# Patient Record
Sex: Male | Born: 1960 | Race: White | Hispanic: No | Marital: Married | Smoking: Never smoker
Health system: Southern US, Community
[De-identification: ages and names within clinical notes are randomized; demographics above are authoritative.]

## PROBLEM LIST (undated history)

## (undated) DIAGNOSIS — M5416 Radiculopathy, lumbar region: Secondary | ICD-10-CM

## (undated) DIAGNOSIS — R197 Diarrhea, unspecified: Secondary | ICD-10-CM

## (undated) DIAGNOSIS — Z8507 Personal history of malignant neoplasm of pancreas: Secondary | ICD-10-CM

## (undated) DIAGNOSIS — E785 Hyperlipidemia, unspecified: Secondary | ICD-10-CM

## (undated) DIAGNOSIS — E1149 Type 2 diabetes mellitus with other diabetic neurological complication: Secondary | ICD-10-CM

## (undated) DIAGNOSIS — N186 End stage renal disease: Secondary | ICD-10-CM

## (undated) DIAGNOSIS — Z862 Personal history of diseases of the blood and blood-forming organs and certain disorders involving the immune mechanism: Secondary | ICD-10-CM

## (undated) DIAGNOSIS — I872 Venous insufficiency (chronic) (peripheral): Secondary | ICD-10-CM

## (undated) DIAGNOSIS — Z87442 Personal history of urinary calculi: Secondary | ICD-10-CM

## (undated) DIAGNOSIS — Z992 Dependence on renal dialysis: Secondary | ICD-10-CM

## (undated) DIAGNOSIS — I10 Essential (primary) hypertension: Secondary | ICD-10-CM

## (undated) DIAGNOSIS — G5603 Carpal tunnel syndrome, bilateral upper limbs: Secondary | ICD-10-CM

## (undated) DIAGNOSIS — M5412 Radiculopathy, cervical region: Secondary | ICD-10-CM

## (undated) DIAGNOSIS — K429 Umbilical hernia without obstruction or gangrene: Secondary | ICD-10-CM

## (undated) HISTORY — DX: End stage renal disease: N18.6

## (undated) HISTORY — DX: Venous insufficiency (chronic) (peripheral): I87.2

## (undated) HISTORY — DX: Personal history of diseases of the blood and blood-forming organs and certain disorders involving the immune mechanism: Z86.2

## (undated) HISTORY — PX: TONSILLECTOMY: SUR1361

## (undated) HISTORY — DX: Personal history of urinary calculi: Z87.442

## (undated) HISTORY — PX: FOOT SURGERY: SHX648

## (undated) HISTORY — PX: WISDOM TOOTH EXTRACTION: SHX21

## (undated) HISTORY — PX: EYE SURGERY: SHX253

## (undated) HISTORY — PX: HEMILAMINOTOMY LUMBAR SPINE: SUR654

## (undated) HISTORY — DX: Umbilical hernia without obstruction or gangrene: K42.9

## (undated) HISTORY — DX: Dependence on renal dialysis: Z99.2

## (undated) HISTORY — DX: Diarrhea, unspecified: R19.7

## (undated) HISTORY — PX: TONSILLECTOMY AND ADENOIDECTOMY: SUR1326

---

## 2004-02-19 ENCOUNTER — Ambulatory Visit: Payer: Self-pay | Admitting: Family Medicine

## 2004-02-24 ENCOUNTER — Ambulatory Visit: Payer: Self-pay | Admitting: Family Medicine

## 2004-02-27 ENCOUNTER — Ambulatory Visit: Payer: Self-pay | Admitting: Family Medicine

## 2004-03-06 ENCOUNTER — Ambulatory Visit: Payer: Self-pay | Admitting: Family Medicine

## 2004-03-17 ENCOUNTER — Ambulatory Visit: Payer: Self-pay | Admitting: Family Medicine

## 2004-03-31 ENCOUNTER — Ambulatory Visit: Payer: Self-pay | Admitting: Family Medicine

## 2004-07-10 ENCOUNTER — Ambulatory Visit: Payer: Self-pay | Admitting: Family Medicine

## 2004-07-16 ENCOUNTER — Ambulatory Visit: Payer: Self-pay | Admitting: Family Medicine

## 2004-08-18 ENCOUNTER — Ambulatory Visit: Payer: Self-pay | Admitting: Family Medicine

## 2004-08-27 ENCOUNTER — Ambulatory Visit: Payer: Self-pay | Admitting: Family Medicine

## 2004-09-07 ENCOUNTER — Ambulatory Visit: Payer: Self-pay | Admitting: Family Medicine

## 2004-09-24 ENCOUNTER — Ambulatory Visit: Payer: Self-pay | Admitting: Family Medicine

## 2004-10-23 ENCOUNTER — Ambulatory Visit: Payer: Self-pay | Admitting: Family Medicine

## 2012-12-08 ENCOUNTER — Other Ambulatory Visit (HOSPITAL_COMMUNITY)
Admission: RE | Admit: 2012-12-08 | Discharge: 2012-12-08 | Disposition: A | Discharge status: Home / Self Care | Payer: BC Managed Care – PPO | Source: Ambulatory Visit | Attending: Pathology | Admitting: Pathology

## 2013-01-03 ENCOUNTER — Other Ambulatory Visit (HOSPITAL_COMMUNITY)
Admission: RE | Admit: 2013-01-03 | Discharge: 2013-01-03 | Disposition: A | Discharge status: Home / Self Care | Payer: BC Managed Care – PPO | Source: Ambulatory Visit | Attending: Pathology | Admitting: Pathology

## 2013-01-03 DIAGNOSIS — C8589 Other specified types of non-Hodgkin lymphoma, extranodal and solid organ sites: Secondary | ICD-10-CM | POA: Insufficient documentation

## 2013-04-19 HISTORY — PX: CATARACT EXTRACTION W/ INTRAOCULAR LENS IMPLANT: SHX1309

## 2015-12-04 ENCOUNTER — Other Ambulatory Visit: Payer: Self-pay | Admitting: Vascular Surgery

## 2015-12-04 DIAGNOSIS — I872 Venous insufficiency (chronic) (peripheral): Secondary | ICD-10-CM

## 2015-12-16 ENCOUNTER — Encounter: Payer: Self-pay | Admitting: Vascular Surgery

## 2015-12-24 ENCOUNTER — Encounter (HOSPITAL_COMMUNITY): Payer: Self-pay

## 2015-12-24 ENCOUNTER — Encounter: Payer: Self-pay | Admitting: Vascular Surgery

## 2016-02-12 ENCOUNTER — Encounter: Payer: Self-pay | Admitting: Vascular Surgery

## 2016-02-17 ENCOUNTER — Encounter (INDEPENDENT_AMBULATORY_CARE_PROVIDER_SITE_OTHER): Payer: Self-pay

## 2016-02-17 ENCOUNTER — Ambulatory Visit (INDEPENDENT_AMBULATORY_CARE_PROVIDER_SITE_OTHER): Payer: BLUE CROSS/BLUE SHIELD | Admitting: Vascular Surgery

## 2016-02-17 ENCOUNTER — Encounter: Payer: Self-pay | Admitting: Vascular Surgery

## 2016-02-17 ENCOUNTER — Ambulatory Visit (HOSPITAL_COMMUNITY)
Admission: RE | Admit: 2016-02-17 | Discharge: 2016-02-17 | Disposition: A | Discharge status: Home / Self Care | Payer: BLUE CROSS/BLUE SHIELD | Source: Ambulatory Visit | Attending: Vascular Surgery | Admitting: Vascular Surgery

## 2016-02-17 VITALS — BP 168/80 | HR 58 | Temp 97.3°F | Resp 18 | Ht 72.0 in | Wt 272.0 lb

## 2016-02-17 DIAGNOSIS — R609 Edema, unspecified: Secondary | ICD-10-CM | POA: Diagnosis present

## 2016-02-17 DIAGNOSIS — I872 Venous insufficiency (chronic) (peripheral): Secondary | ICD-10-CM | POA: Insufficient documentation

## 2016-02-17 DIAGNOSIS — I83893 Varicose veins of bilateral lower extremities with other complications: Secondary | ICD-10-CM | POA: Diagnosis not present

## 2016-02-17 NOTE — Progress Notes (Signed)
Vascular and Vein Specialist of [facility]  Patient name: Scott Castillo MRN: [id number] DOB: January 16, 1961 Sex: male  REASON FOR CONSULT: Severe bilateral venous stasis disease  HPI: Scott Castillo is a 55 y.o. male, who is day for evaluation of his severe bilateral venous stasis disease. He is very pleasant gentleman with progressive changes of hemosiderin deposits and thickening in both lower extremities. He has a long-standing history of diabetes and also has bilateral Charcot foot. He has had no difficulty with the arterial insufficiency. He has had progressive changes of severe venous stasis disease from his knees down to his ankle bilaterally with hemosiderin deposits circumferentially. He has had some superficial excoriations but no true venous ulcers. He stands for long shifts as his work at [employer] home [employer]. He does wear compression garments and has so for years. Despite this he has had continued progression of the severe swelling and achy sensation. He does have pain with prolonged standing extending into his legs below his knees bilaterally.  Past Medical History:  Diagnosis Date  . Venous stasis dermatitis of both lower extremities     No family history on file.  SOCIAL HISTORY: Social History   Social History  . Marital status: Married    Spouse name: N/A  . Number of children: N/A  . Years of education: N/A   Occupational History  . Not on file.   Social History Main Topics  . Smoking status: Never Smoker  . Smokeless tobacco: Not on file  . Alcohol use No  . Drug use: No  . Sexual activity: Not on file   Other Topics Concern  . Not on file   Social History Narrative  . No narrative on file    Allergies  Allergen Reactions  . Pioglitazone Swelling    Current Outpatient Prescriptions  Medication Sig Dispense Refill  . carvedilol (COREG) 25 MG tablet Take 25 mg by mouth.    . Cholecalciferol (VITAMIN D3) 1000  units CAPS Take by mouth.    Marland Kitchen CINNAMON PO Take by mouth.    . co-enzyme Q-10 50 MG capsule Take 100 mg by mouth daily.    Marland Kitchen DANDELION PO Take by mouth.    . Horse Chestnut 300 MG CAPS Take by mouth.    . Insulin Degludec 200 UNIT/ML SOPN Inject into the skin. TRESIBA 46 Units    . Insulin Pen Needle (BD ULTRA-FINE PEN NEEDLES) 29G X 12.7MM MISC USE AS DIRECTED EVERY DAY    . Misc Natural Products (ECHINACEA COMPOUND PO) Take by mouth.    . Misc Natural Products (OSTEO BI-FLEX TRIPLE STRENGTH) TABS Take by mouth.    . Multiple Vitamin (MULTIVITAMIN) tablet Take 1 tablet by mouth daily.    . pravastatin (PRAVACHOL) 80 MG tablet Take 80 mg by mouth.    . Probiotic Product ([employer] ADV PROBIOTIC GUMMIES PO) Take by mouth.    . sitaGLIPtin (JANUVIA) 50 MG tablet Take 50 mg by mouth.    . TURMERIC PO Take 720 mg by mouth 2 (two) times daily.    . valsartan (DIOVAN) 160 MG tablet TAKE 1 TABLET (160 MG TOTAL) BY MOUTH EVERY MORNING. HIGH BLOOD PRESSURE    . vitamin B-12 (CYANOCOBALAMIN) 500 MCG tablet Take 500 mcg by mouth daily.    . vitamin C (ASCORBIC ACID) 500 MG tablet Take 500 mg by mouth daily.    . vitamin E 1000 UNIT capsule Take 1,000 Units by mouth daily.     No  current facility-administered medications for this visit.     REVIEW OF SYSTEMS:  [X]  denotes positive finding, [ ]  denotes negative finding Cardiac  Comments:  Chest pain or chest pressure:    Shortness of breath upon exertion:    Short of breath when lying flat:    Irregular heart rhythm:        Vascular    Pain in calf, thigh, or hip brought on by ambulation:    Pain in feet at night that wakes you up from your sleep:     Blood clot in your veins:    Leg swelling:  x       Pulmonary    Oxygen at home:    Productive cough:     Wheezing:         Neurologic    Sudden weakness in arms or legs:     Sudden numbness in arms or legs:     Sudden onset of difficulty speaking or slurred speech:    Temporary loss of vision  in one eye:     Problems with dizziness:         Gastrointestinal    Blood in stool:     Vomited blood:         Genitourinary    Burning when urinating:     Blood in urine:        Psychiatric    G[place]ka depression:         Hematologic    Bleeding problems:    Problems with blood clotting too easily:        Skin    Rashes or ulcers:        Constitutional    Fever or chills:      PHYSICAL EXAM: Vitals:   02/17/16 1323  BP: (!) 168/80  Pulse: (!) 58  Resp: 18  Temp: 97.3 F (36.3 C)  TempSrc: Oral  SpO2: 98%  Weight: 272 lb (123.4 kg)  Height: 6' (1.829 m)    GENERAL: The patient is a well-nourished male, in no acute distress. The vital signs are documented above. CARDIOVASCULAR: He does have severe swelling in both lower extremities. He does have palpable dorsalis pedis pulses bilaterally. Carotid arteries are without bruits bilaterally. Heart is regular rate and rhythm PULMONARY: There is good air exchange  ABDOMEN: Soft and non-tender  MUSCULOSKELETAL: There are no Benoist deformities or cyanosis. NEUROLOGIC: No focal weakness or paresthesias are detected. SKIN: Severe changes of chronic venous stasis disease with extreme thickening from just below his knees to his ankle and circumferential hemosiderin deposits bilaterally PSYCHIATRIC: The patient has a normal affect.  DATA:  Tributary venous reflux study shows no evidence of DVT or obstruction. Does have reflux in his great saphenous vein bilaterally with significant dilatation and his saphenous veins bilaterally as well. He does have some deep venous reflux limited to his common femoral only in the left leg and in his common femoral and femoral vein and the right leg.  MEDICAL ISSUES: Severe lower extremity venous hypertension. Marked swelling and progressive changes regarding his hemosiderin deposit. Have recommended bilateral stage laser ablation of his great saphenous vein for improvement in his venous  hypertension. Described the procedures an outpatient procedure under local anesthesia in our office. He wishes to proceed as soon as possible related to his work schedule. He reports the left leg is somewhat worse in the right and therefore we will treat this leg first and then a planned right leg treatment several weeks  later   [name], MD [facility] Vascular and Vein Specialists of [facility] [phone] Pager [phone]

## 2016-03-17 ENCOUNTER — Other Ambulatory Visit: Payer: Self-pay | Admitting: *Deleted

## 2016-03-17 DIAGNOSIS — I83893 Varicose veins of bilateral lower extremities with other complications: Secondary | ICD-10-CM

## 2016-04-01 ENCOUNTER — Encounter: Payer: Self-pay | Admitting: Vascular Surgery

## 2016-04-08 ENCOUNTER — Ambulatory Visit (INDEPENDENT_AMBULATORY_CARE_PROVIDER_SITE_OTHER): Payer: BLUE CROSS/BLUE SHIELD | Admitting: Vascular Surgery

## 2016-04-08 ENCOUNTER — Encounter: Payer: Self-pay | Admitting: Vascular Surgery

## 2016-04-08 VITALS — BP 161/82 | HR 52 | Temp 96.9°F | Resp 18 | Ht 73.0 in | Wt 282.0 lb

## 2016-04-08 DIAGNOSIS — I83893 Varicose veins of bilateral lower extremities with other complications: Secondary | ICD-10-CM

## 2016-04-08 HISTORY — PX: ENDOVENOUS ABLATION SAPHENOUS VEIN W/ LASER: SUR449

## 2016-04-08 NOTE — Progress Notes (Signed)
     Laser Ablation Procedure    Date: 04/08/2016   Scott Castillo DOB:1961-03-06  Consent signed: Yes    Surgeon:  Dr. Sherren Mocha Tammela Bales  Procedure: Laser Ablation: right Greater Saphenous Vein  BP (!) 161/82 (BP Location: Left Arm, Patient Position: Sitting, Cuff Size: Large)   Pulse (!) 52   Temp (!) 96.9 F (36.1 C) (Oral)   Resp 18   Ht 6\' 1"  (1.854 m)   Wt 282 lb (127.9 kg)   SpO2 100%   BMI 37.21 kg/m   Tumescent Anesthesia: 500 cc 0.9% NaCl with 50 cc Lidocaine HCL with 1% Epi and 15 cc 8.4% NaHCO3  Local Anesthesia: 6 cc Lidocaine HCL and NaHCO3 (ratio 2:1)  15 watts continuous mode        Total energy: 3198   Total time: 3:33       Patient tolerated procedure well   Description of Procedure:  After marking the course of the secondary varicosities, the patient was placed on the operating table in the supine position, and the right leg was prepped and draped in sterile fashion.   Local anesthetic was administered and under ultrasound guidance the saphenous vein was accessed with a micro needle and guide wire; then the mirco puncture sheath was placed.  A guide wire was inserted saphenofemoral junction , followed by a 5 french sheath.  The position of the sheath and then the laser fiber below the junction was confirmed using the ultrasound.  Tumescent anesthesia was administered along the course of the saphenous vein using ultrasound guidance. The patient was placed in Trendelenburg position and protective laser glasses were placed on patient and staff, and the laser was fired at 15 watts continuous mode advancing 1-35mm/second for a total of 3198  joules.   Steri strip was applied and ABD pads and thigh high compression stockings were applied.  Ace wrap bandages were applied  at the top of the saphenofemoral junction. Blood loss was less than 15 cc.  The patient ambulated out of the operating room having tolerated the procedure well.  Uneventful ablation from mid calf to  below saphenofemoral junction. Follow-up in one week with ultrasound

## 2016-04-09 ENCOUNTER — Encounter: Payer: Self-pay | Admitting: Vascular Surgery

## 2016-04-16 ENCOUNTER — Ambulatory Visit (HOSPITAL_COMMUNITY)
Admission: RE | Admit: 2016-04-16 | Discharge: 2016-04-16 | Disposition: A | Discharge status: Home / Self Care | Payer: BLUE CROSS/BLUE SHIELD | Source: Ambulatory Visit | Attending: Vascular Surgery | Admitting: Vascular Surgery

## 2016-04-16 DIAGNOSIS — Z9889 Other specified postprocedural states: Secondary | ICD-10-CM | POA: Diagnosis not present

## 2016-04-16 DIAGNOSIS — I83893 Varicose veins of bilateral lower extremities with other complications: Secondary | ICD-10-CM | POA: Insufficient documentation

## 2016-04-20 ENCOUNTER — Telehealth: Payer: Self-pay | Admitting: *Deleted

## 2016-04-20 ENCOUNTER — Other Ambulatory Visit: Payer: Self-pay | Admitting: *Deleted

## 2016-04-20 DIAGNOSIS — I83893 Varicose veins of bilateral lower extremities with other complications: Secondary | ICD-10-CM

## 2016-04-20 NOTE — Telephone Encounter (Signed)
Per Dr. Luther Parody instructions, called Scott Castillo and notified him that post laser ablation duplex of right leg (04-16-2016) showed that right greater saphenous vein was closed/successfully ablated and that no DVT was noted.  Per Dr. Donnetta Hutching, VV FU on 04-22-2016 was cancelled and Scott Castillo aware of appointment cancellation. Scott Castillo verbalized understanding.

## 2016-04-22 ENCOUNTER — Ambulatory Visit: Payer: BLUE CROSS/BLUE SHIELD | Admitting: Vascular Surgery

## 2016-05-06 ENCOUNTER — Encounter: Payer: Self-pay | Admitting: Vascular Surgery

## 2016-05-13 ENCOUNTER — Ambulatory Visit (INDEPENDENT_AMBULATORY_CARE_PROVIDER_SITE_OTHER): Payer: BLUE CROSS/BLUE SHIELD | Admitting: Vascular Surgery

## 2016-05-13 ENCOUNTER — Encounter: Payer: Self-pay | Admitting: Vascular Surgery

## 2016-05-13 VITALS — BP 149/75 | HR 75 | Temp 97.2°F | Resp 18 | Ht 73.0 in | Wt 282.0 lb

## 2016-05-13 DIAGNOSIS — I83893 Varicose veins of bilateral lower extremities with other complications: Secondary | ICD-10-CM

## 2016-05-13 HISTORY — PX: ENDOVENOUS ABLATION SAPHENOUS VEIN W/ LASER: SUR449

## 2016-05-13 NOTE — Progress Notes (Signed)
     Laser Ablation Procedure    Date: 05/13/2016   Scott Castillo DOB:1961-03-03  Consent signed: Yes    Surgeon:  Dr. Sherren Mocha Jaisen Wiltrout  Procedure: Laser Ablation: left Greater Saphenous Vein  BP (!) 149/75 (BP Location: Right Arm, Patient Position: Sitting, Cuff Size: Large)   Pulse 75   Temp 97.2 F (36.2 C) (Oral)   Resp 18   Ht 6\' 1"  (1.854 m)   Wt 282 lb (127.9 kg)   SpO2 97%   BMI 37.21 kg/m   Tumescent Anesthesia: 425 cc 0.9% NaCl with 50 cc Lidocaine HCL with 1% Epi and 15 cc 8.4% NaHCO3  Local Anesthesia: 4 cc Lidocaine HCL and NaHCO3 (ratio 2:1)  15 watts continuous mode        Total energy: 3294 Joules   Total time: 3:39     Patient tolerated procedure well    Description of Procedure:  After marking the course of the secondary varicosities, the patient was placed on the operating table in the supine position, and the left leg was prepped and draped in sterile fashion.   Local anesthetic was administered and under ultrasound guidance the saphenous vein was accessed with a micro needle and guide wire; then the mirco puncture sheath was placed.  A guide wire was inserted saphenofemoral junction , followed by a 5 french sheath.  The position of the sheath and then the laser fiber below the junction was confirmed using the ultrasound.  Tumescent anesthesia was administered along the course of the saphenous vein using ultrasound guidance. The patient was placed in Trendelenburg position and protective laser glasses were placed on patient and staff, and the laser was fired at 15 watts continuous mode advancing 1-55mm/second for a total of 3294 joules.     Steri strip was applied to the IV insertion site and ABD pads and thigh high compression stockings were applied.  Ace wrap bandages were applied over the calf and thigh. Blood loss was less than 15 cc.  The patient ambulated out of the operating room having tolerated the procedure well.  Uneventful ablation from mid calf  to just below saphenofemoral junction. Follow-up in one week

## 2016-05-14 ENCOUNTER — Telehealth: Payer: Self-pay | Admitting: *Deleted

## 2016-05-14 NOTE — Telephone Encounter (Signed)
    05/14/2016  Time: 8:50 AM   Patient Name: Scott Castillo  Patient of: T.F. Early  Procedure:Laser Ablation left greater saphenous vein 05-13-2016  Reached patient at home and checked  His status  Yes    Comments/Actions Taken: Mr. Meggison states he is "sore in my left thigh--over the area that Dr. Donnetta Hutching treated."  Mr. Allers states he is taking Ibuprofen as instructed (Ibuprofen 600 mg three times daily with meals), elevating his left leg, and wearing his compression dressing.  Suggested that he use ice compress over left thigh as needed for discomfort.  Reviewed all post procedural instructions with him and reminded him of post LA duplex and VV follow up with Dr. Donnetta Hutching on 05-20-2016.        @SIGNATURE @

## 2016-05-17 ENCOUNTER — Encounter: Payer: Self-pay | Admitting: Vascular Surgery

## 2016-05-20 ENCOUNTER — Ambulatory Visit (INDEPENDENT_AMBULATORY_CARE_PROVIDER_SITE_OTHER): Payer: BLUE CROSS/BLUE SHIELD | Admitting: Vascular Surgery

## 2016-05-20 ENCOUNTER — Encounter: Payer: Self-pay | Admitting: Vascular Surgery

## 2016-05-20 ENCOUNTER — Ambulatory Visit (HOSPITAL_COMMUNITY)
Admission: RE | Admit: 2016-05-20 | Discharge: 2016-05-20 | Disposition: A | Discharge status: Home / Self Care | Payer: BLUE CROSS/BLUE SHIELD | Source: Ambulatory Visit | Attending: Vascular Surgery | Admitting: Vascular Surgery

## 2016-05-20 VITALS — BP 177/85 | HR 73 | Temp 97.3°F | Resp 22 | Ht 73.0 in | Wt 301.0 lb

## 2016-05-20 DIAGNOSIS — I83893 Varicose veins of bilateral lower extremities with other complications: Secondary | ICD-10-CM | POA: Diagnosis not present

## 2016-05-20 NOTE — Progress Notes (Signed)
Vascular and Vein Specialist of [facility]  Patient name: Scott Castillo MRN: [id number] DOB: 05-19-1960 Sex: male  REASON FOR VISIT: One-week follow-up after left leg great saphenous vein ablation  HPI: Scott Castillo is a 56 y.o. male here today for follow-up. Has had staged bilateral great saphenous vein ablation. Has severe swelling in both lower from these with the bilateral chronic ulcerations. He reports mild discomfort following his procedure. Has been compliant with his compression  Past Medical History:  Diagnosis Date  . Venous stasis dermatitis of both lower extremities     No family history on file.  SOCIAL HISTORY: Social History  Substance Use Topics  . Smoking status: Never Smoker  . Smokeless tobacco: Never Used  . Alcohol use No    Allergies  Allergen Reactions  . Pioglitazone Swelling    Current Outpatient Prescriptions  Medication Sig Dispense Refill  . carvedilol (COREG) 25 MG tablet Take 25 mg by mouth.    . Cholecalciferol (VITAMIN D3) 1000 units CAPS Take by mouth.    Marland Kitchen CINNAMON PO Take by mouth.    . co-enzyme Q-10 50 MG capsule Take 100 mg by mouth daily.    Marland Kitchen DANDELION PO Take by mouth.    . Horse Chestnut 300 MG CAPS Take by mouth.    . Insulin Degludec 200 UNIT/ML SOPN Inject into the skin. TRESIBA 46 Units    . Insulin Pen Needle (BD ULTRA-FINE PEN NEEDLES) 29G X 12.7MM MISC USE AS DIRECTED EVERY DAY    . Misc Natural Products (ECHINACEA COMPOUND PO) Take by mouth.    . Misc Natural Products (OSTEO BI-FLEX TRIPLE STRENGTH) TABS Take by mouth.    . Multiple Vitamin (MULTIVITAMIN) tablet Take 1 tablet by mouth daily.    . pravastatin (PRAVACHOL) 80 MG tablet Take 80 mg by mouth.    . Probiotic Product (CVS ADV PROBIOTIC GUMMIES PO) Take by mouth.    . sitaGLIPtin (JANUVIA) 50 MG tablet Take 50 mg by mouth.    . TURMERIC PO Take 720 mg by mouth 2 (two) times daily.    . valsartan (DIOVAN) 160 MG tablet  TAKE 1 TABLET (160 MG TOTAL) BY MOUTH EVERY MORNING. HIGH BLOOD PRESSURE    . vitamin B-12 (CYANOCOBALAMIN) 500 MCG tablet Take 500 mcg by mouth daily.    . vitamin C (ASCORBIC ACID) 500 MG tablet Take 500 mg by mouth daily.    . vitamin E 1000 UNIT capsule Take 1,000 Units by mouth daily.     No current facility-administered medications for this visit.     REVIEW OF SYSTEMS:  [X]  denotes positive finding, [ ]  denotes negative finding Cardiac  Comments:  Chest pain or chest pressure:    Shortness of breath upon exertion:    Short of breath when lying flat:    Irregular heart rhythm:        Vascular    Pain in calf, thigh, or hip brought on by ambulation:    Pain in feet at night that wakes you up from your sleep:     Blood clot in your veins:    Leg swelling:  x         PHYSICAL EXAM: Vitals:   05/20/16 0902  BP: (!) 177/85  Pulse: 73  Resp: (!) 22  Temp: 97.3 F (36.3 C)  TempSrc: Oral  SpO2: 97%  Weight: (!) 301 lb (136.5 kg)  Height: 6\' 1"  (1.854 m)    GENERAL: The patient is  a well-nourished male, in no acute distress. The vital signs are documented above. CARDIOVASCULAR: No evidence of varicosities PULMONARY: There is good air exchange  MUSCULOSKELETAL: There are no Werden deformities or cyanosis. NEUROLOGIC: No focal weakness or paresthesias are detected. SKIN: Superficial ulceration over his right pretibial and the blister over his left pretibial area PSYCHIATRIC: The patient has a normal affect.  DATA:  Duplex was reviewed with patient. This shows closure of his great saphenous vein from the distal insertion site to approximately 2 cm below the saphenofemoral junction  MEDICAL ISSUES: Stable status post staged bilateral great saphenous vein ablation. Patient understands critical importance of lifelong elevation and compression. Will see [place] again on as-needed basis    [name], MD [facility] Vascular and Vein Specialists of [facility] Tel [phone] Pager [phone]

## 2016-05-27 ENCOUNTER — Telehealth: Payer: Self-pay | Admitting: *Deleted

## 2016-05-27 NOTE — Telephone Encounter (Signed)
Returning Mr. Dirocco voice message.  Unable to speak with Mr. Nagele directly so left a detailed telephone voice message.  Mr. Mier is s/p endovenous laser ablation left greater saphenous vein on 05-13-2016 by Curt Jews MD.  Mr. Saldierna stated in his voice message that he had developed "water blisters on his left leg" and he had "an infection in his left leg."   Mr. Mcnealy stated in his voice message that he had been seen by a physician and was taking oral antibiotics and "an ointment."  Mr. Thatch asked if he needed to wear compression hose and if he could wear lower strength of compression hose.  Reviewed Mr. Wafer symptoms and questions to Dr. Donnetta Hutching.  Dr. Donnetta Hutching advised that Mr. Failla did need to continue wearing knee high compression hose 20-30 mm HG after the blisters resolved/healed.  Relayed Dr. Luther Parody recommendation to Mr. Mendolia via telephone voice message and asked that he call [facility] if he had further questions.

## 2016-07-02 ENCOUNTER — Inpatient Hospital Stay (HOSPITAL_COMMUNITY)
Admission: EM | Admit: 2016-07-02 | Discharge: 2016-07-06 | DRG: 682 | Disposition: A | Discharge status: Home / Self Care | Payer: BLUE CROSS/BLUE SHIELD | Attending: Internal Medicine | Admitting: Internal Medicine

## 2016-07-02 ENCOUNTER — Encounter (HOSPITAL_COMMUNITY): Payer: Self-pay

## 2016-07-02 DIAGNOSIS — E785 Hyperlipidemia, unspecified: Secondary | ICD-10-CM | POA: Diagnosis present

## 2016-07-02 DIAGNOSIS — Z6839 Body mass index (BMI) 39.0-39.9, adult: Secondary | ICD-10-CM

## 2016-07-02 DIAGNOSIS — I872 Venous insufficiency (chronic) (peripheral): Secondary | ICD-10-CM | POA: Diagnosis present

## 2016-07-02 DIAGNOSIS — D638 Anemia in other chronic diseases classified elsewhere: Secondary | ICD-10-CM | POA: Diagnosis present

## 2016-07-02 DIAGNOSIS — Z79899 Other long term (current) drug therapy: Secondary | ICD-10-CM

## 2016-07-02 DIAGNOSIS — I132 Hypertensive heart and chronic kidney disease with heart failure and with stage 5 chronic kidney disease, or end stage renal disease: Secondary | ICD-10-CM | POA: Diagnosis present

## 2016-07-02 DIAGNOSIS — M7989 Other specified soft tissue disorders: Secondary | ICD-10-CM | POA: Diagnosis present

## 2016-07-02 DIAGNOSIS — E669 Obesity, unspecified: Secondary | ICD-10-CM | POA: Diagnosis present

## 2016-07-02 DIAGNOSIS — E1122 Type 2 diabetes mellitus with diabetic chronic kidney disease: Secondary | ICD-10-CM | POA: Diagnosis present

## 2016-07-02 DIAGNOSIS — Z8507 Personal history of malignant neoplasm of pancreas: Secondary | ICD-10-CM

## 2016-07-02 DIAGNOSIS — I2510 Atherosclerotic heart disease of native coronary artery without angina pectoris: Secondary | ICD-10-CM | POA: Diagnosis present

## 2016-07-02 DIAGNOSIS — N189 Chronic kidney disease, unspecified: Secondary | ICD-10-CM

## 2016-07-02 DIAGNOSIS — I5033 Acute on chronic diastolic (congestive) heart failure: Secondary | ICD-10-CM | POA: Diagnosis present

## 2016-07-02 DIAGNOSIS — N179 Acute kidney failure, unspecified: Secondary | ICD-10-CM | POA: Diagnosis not present

## 2016-07-02 DIAGNOSIS — N185 Chronic kidney disease, stage 5: Secondary | ICD-10-CM | POA: Diagnosis present

## 2016-07-02 DIAGNOSIS — E1169 Type 2 diabetes mellitus with other specified complication: Secondary | ICD-10-CM | POA: Diagnosis present

## 2016-07-02 DIAGNOSIS — R600 Localized edema: Secondary | ICD-10-CM | POA: Diagnosis present

## 2016-07-02 DIAGNOSIS — E875 Hyperkalemia: Secondary | ICD-10-CM | POA: Diagnosis present

## 2016-07-02 DIAGNOSIS — Z794 Long term (current) use of insulin: Secondary | ICD-10-CM

## 2016-07-02 DIAGNOSIS — N186 End stage renal disease: Secondary | ICD-10-CM | POA: Diagnosis present

## 2016-07-02 DIAGNOSIS — Z888 Allergy status to other drugs, medicaments and biological substances status: Secondary | ICD-10-CM

## 2016-07-02 DIAGNOSIS — I160 Hypertensive urgency: Secondary | ICD-10-CM | POA: Diagnosis present

## 2016-07-02 LAB — COMPREHENSIVE METABOLIC PANEL
ALBUMIN: 2.9 g/dL — AB (ref 3.5–5.0)
ALT: 14 U/L — ABNORMAL LOW (ref 17–63)
ANION GAP: 9 (ref 5–15)
AST: 15 U/L (ref 15–41)
Alkaline Phosphatase: 63 U/L (ref 38–126)
BUN: 62 mg/dL — ABNORMAL HIGH (ref 6–20)
CHLORIDE: 110 mmol/L (ref 101–111)
CO2: 20 mmol/L — AB (ref 22–32)
Calcium: 8.5 mg/dL — ABNORMAL LOW (ref 8.9–10.3)
Creatinine, Ser: 4.92 mg/dL — ABNORMAL HIGH (ref 0.61–1.24)
GFR calc Af Amer: 14 mL/min — ABNORMAL LOW (ref >60)
GFR calc non Af Amer: 12 mL/min — ABNORMAL LOW (ref >60)
GLUCOSE: 84 mg/dL (ref 65–99)
POTASSIUM: 5.6 mmol/L — AB (ref 3.5–5.1)
SODIUM: 139 mmol/L (ref 135–145)
Total Bilirubin: 0.4 mg/dL (ref 0.3–1.2)
Total Protein: 6.3 g/dL — ABNORMAL LOW (ref 6.5–8.1)

## 2016-07-02 LAB — CBC WITH DIFFERENTIAL/PLATELET
Basophils Absolute: 0 10*3/uL (ref 0.0–0.1)
Basophils Relative: 1 %
Eosinophils Absolute: 0.6 10*3/uL (ref 0.0–0.7)
Eosinophils Relative: 9 %
HEMATOCRIT: 27.1 % — AB (ref 39.0–52.0)
Hemoglobin: 8.6 g/dL — ABNORMAL LOW (ref 13.0–17.0)
LYMPHS ABS: 0.9 10*3/uL (ref 0.7–4.0)
LYMPHS PCT: 13 %
MCH: 28.4 pg (ref 26.0–34.0)
MCHC: 31.7 g/dL (ref 30.0–36.0)
MCV: 89.4 fL (ref 78.0–100.0)
MONO ABS: 0.6 10*3/uL (ref 0.1–1.0)
Monocytes Relative: 9 %
NEUTROS ABS: 5 10*3/uL (ref 1.7–7.7)
Neutrophils Relative %: 68 %
Platelets: 216 10*3/uL (ref 150–400)
RBC: 3.03 MIL/uL — AB (ref 4.22–5.81)
RDW: 14.1 % (ref 11.5–15.5)
WBC: 7.2 10*3/uL (ref 4.0–10.5)

## 2016-07-02 LAB — URINALYSIS, ROUTINE W REFLEX MICROSCOPIC
BILIRUBIN URINE: NEGATIVE
Glucose, UA: 50 mg/dL — AB
KETONES UR: NEGATIVE mg/dL
LEUKOCYTES UA: NEGATIVE
Nitrite: NEGATIVE
Protein, ur: 100 mg/dL — AB
SQUAMOUS EPITHELIAL / LPF: NONE SEEN
Specific Gravity, Urine: 1.011 (ref 1.005–1.030)
pH: 5 (ref 5.0–8.0)

## 2016-07-02 MED ORDER — CARVEDILOL 12.5 MG PO TABS: 25.0000 mg | ORAL_TABLET | Freq: Two times a day (BID) | ORAL | Status: DC

## 2016-07-02 MED ORDER — IRBESARTAN 150 MG PO TABS: 150.0000 mg | ORAL_TABLET | Freq: Every day | ORAL | Status: DC

## 2016-07-02 MED ORDER — HYDRALAZINE HCL 25 MG PO TABS
50.0000 mg | ORAL_TABLET | Freq: Once | ORAL | Status: AC
  Administered 2016-07-03: 50 mg via ORAL
  Filled 2016-07-02: qty 2

## 2016-07-02 NOTE — ED Provider Notes (Signed)
[facility] DEPT Provider Note   CSN: [id number] Arrival date & time: 07/02/16  1802     History   Chief Complaint Chief Complaint  Patient presents with  . Abnormal Lab  . Leg Swelling    HPI Luverne Zerkle Knoche is a 56 y.o. male.  HPI   Patient is a 56 year old male with history of hypertension, diabetes, CAD and venous stasis who presents to the ED with complaint of dyspnea on exertion. Patient reports over the past month after having ablations performed for varicose veins to BLE he began to have gradually worsening SOB and worsening swelling to BLE. Endorses having intermittent nonproductive cough over the past month. Patient reports his symptoms significantly worsened this week with associated dyspnea on exertion. Patient states he was initially seen by his PCP and had labs performed. Pt reports he was notified to come to the ED today by his PCP due to worsening kidney function and DOE. Denies fever, headache, dizziness, wheezing, CP, palpitations, abdominal pain, N/V/D, urinary sxs, flank pain, numbness, weakness.  Past Medical History:  Diagnosis Date  . Venous stasis dermatitis of both lower extremities     There are no active problems to display for this patient.   Past Surgical History:  Procedure Laterality Date  . ENDOVENOUS ABLATION SAPHENOUS VEIN W/ LASER Right 04/08/2016   EVLA R greater saphenous vein by Curt Jews MD  . ENDOVENOUS ABLATION SAPHENOUS VEIN W/ LASER Left 05/13/2016   endovenous laser ablation (left greater saphenous vein) by Curt Jews MD        Home Medications    Prior to Admission medications   Medication Sig Start Date End Date Taking? Authorizing Provider  carvedilol (COREG) 25 MG tablet Take 25 mg by mouth. 07/28/15   Historical Provider, MD  Cholecalciferol (VITAMIN D3) 1000 units CAPS Take by mouth.    Historical Provider, MD  CINNAMON PO Take by mouth.    Historical Provider, MD  co-enzyme Q-10 50 MG capsule Take 100 mg by mouth  daily.    Historical Provider, MD  DANDELION PO Take by mouth.    Historical Provider, MD  Horse Chestnut 300 MG CAPS Take by mouth.    Historical Provider, MD  Insulin Degludec 200 UNIT/ML SOPN Inject into the skin. TRESIBA 46 Units 11/11/15   Historical Provider, MD  Insulin Pen Needle (BD ULTRA-FINE PEN NEEDLES) 29G X 12.7MM MISC USE AS DIRECTED EVERY DAY 01/15/16   Historical Provider, MD  Misc Natural Products (ECHINACEA COMPOUND PO) Take by mouth.    Historical Provider, MD  Misc Natural Products (OSTEO BI-FLEX TRIPLE STRENGTH) TABS Take by mouth.    Historical Provider, MD  Multiple Vitamin (MULTIVITAMIN) tablet Take 1 tablet by mouth daily.    Historical Provider, MD  pravastatin (PRAVACHOL) 80 MG tablet Take 80 mg by mouth.    Historical Provider, MD  Probiotic Product (CVS ADV PROBIOTIC GUMMIES PO) Take by mouth.    Historical Provider, MD  sitaGLIPtin (JANUVIA) 50 MG tablet Take 50 mg by mouth.    Historical Provider, MD  TURMERIC PO Take 720 mg by mouth 2 (two) times daily.    Historical Provider, MD  valsartan (DIOVAN) 160 MG tablet TAKE 1 TABLET (160 MG TOTAL) BY MOUTH EVERY MORNING. HIGH BLOOD PRESSURE 01/19/16   Historical Provider, MD  vitamin B-12 (CYANOCOBALAMIN) 500 MCG tablet Take 500 mcg by mouth daily.    Historical Provider, MD  vitamin C (ASCORBIC ACID) 500 MG tablet Take 500 mg by mouth daily.  Historical Provider, MD  vitamin E 1000 UNIT capsule Take 1,000 Units by mouth daily.    Historical Provider, MD    Family History History reviewed. No pertinent family history.  Social History Social History  Substance Use Topics  . Smoking status: Never Smoker  . Smokeless tobacco: Never Used  . Alcohol use No     Allergies   Pioglitazone   Review of Systems Review of Systems  Respiratory: Positive for cough and shortness of breath (DOE).   Cardiovascular: Positive for leg swelling.  All other systems reviewed and are negative.    Physical Exam Updated  Vital Signs BP (!) 191/83 (BP Location: Right Arm)   Pulse 61   Temp 98.3 F (36.8 C) (Oral)   Resp (!) 22   Ht 6\' 1"  (1.854 m)   Wt 135.2 kg   SpO2 99%   BMI 39.32 kg/m   Physical Exam  Constitutional: He is oriented to person, place, and time. He appears well-developed and well-nourished. No distress.  HENT:  Head: Normocephalic and atraumatic.  Mouth/Throat: Uvula is midline, oropharynx is clear and moist and mucous membranes are normal. No oropharyngeal exudate, posterior oropharyngeal edema, posterior oropharyngeal erythema or tonsillar abscesses. No tonsillar exudate.  Eyes: Conjunctivae and EOM are normal. Pupils are equal, round, and reactive to light. Right eye exhibits no discharge. Left eye exhibits no discharge. No scleral icterus.  Neck: Normal range of motion. Neck supple.  Cardiovascular: Normal rate, regular rhythm, normal heart sounds and intact distal pulses.   Pulmonary/Chest: Effort normal and breath sounds normal. No respiratory distress. He has no wheezes. He has no rales. He exhibits no tenderness.  Abdominal: Soft. Bowel sounds are normal. He exhibits no distension and no mass. There is no tenderness. There is no rebound and no guarding. No hernia.  Musculoskeletal: Normal range of motion. He exhibits edema. He exhibits no tenderness or deformity.  3+ pitting edema present to BLE extending up to inferior knee.  Neurological: He is alert and oriented to person, place, and time.  Skin: Skin is warm and dry. He is not diaphoretic.  Nursing note and vitals reviewed.    ED Treatments / Results  Labs (all labs ordered are listed, but only abnormal results are displayed) Labs Reviewed  COMPREHENSIVE METABOLIC PANEL - Abnormal; Notable for the following:       Result Value   Potassium 5.6 (*)    CO2 20 (*)    BUN 62 (*)    Creatinine, Ser 4.92 (*)    Calcium 8.5 (*)    Total Protein 6.3 (*)    Albumin 2.9 (*)    ALT 14 (*)    GFR calc non Af Amer 12 (*)     GFR calc Af Amer 14 (*)    All other components within normal limits  CBC WITH DIFFERENTIAL/PLATELET - Abnormal; Notable for the following:    RBC 3.03 (*)    Hemoglobin 8.6 (*)    HCT 27.1 (*)    All other components within normal limits  URINALYSIS, ROUTINE W REFLEX MICROSCOPIC - Abnormal; Notable for the following:    Color, Urine STRAW (*)    Glucose, UA 50 (*)    Hgb urine dipstick SMALL (*)    Protein, ur 100 (*)    Bacteria, UA RARE (*)    All other components within normal limits  BRAIN NATRIURETIC PEPTIDE    EKG  EKG Interpretation None       Radiology Dg Chest  2 View  Result Date: 07/03/2016 CLINICAL DATA:  Shortness of breath. Exertional dyspnea, bilateral leg swelling. History of hypertension and diabetes. EXAM: CHEST  2 VIEW COMPARISON:  None. FINDINGS: Mild cardiac enlargement. Minimal if any pulmonary vascular congestion but there is diffuse interstitial pattern to the lungs and small bilateral pleural effusions suggesting fluid overload and edema. No focal consolidation or volume loss. No pneumothorax. Mild degenerative changes in the spine. Tortuous aorta. IMPRESSION: Cardiac enlargement. Bilateral interstitial edema and small bilateral pleural effusions. Electronically Signed   By: Lucienne Capers M.D.   On: 07/03/2016 00:31    Procedures Procedures (including critical care time)  Medications Ordered in ED Medications  hydrALAZINE (APRESOLINE) tablet 50 mg (50 mg Oral Given 07/03/16 0036)  furosemide (LASIX) injection 80 mg (80 mg Intravenous Given 07/03/16 0036)     Initial Impression / Assessment and Plan / ED Course  I have reviewed the triage vital signs and the nursing notes.  Pertinent labs & imaging results that were available during my care of the patient were reviewed by me and considered in my medical decision making (see chart for details).    Patient presents with worsening dyspnea on exertion and lower leg swelling. Reports he was advised to  come to the ED for further management due to worsening renal function. Patient states his creatinine clearance was 12 and his PCPs office this week and notes it was previously 21 last summer. He states he was referred to nephrology but does not have an appointment until April 5. Vitals showed BP 191/83, pt reports he did not take his home PM meds including valsartan and carvedilol. Exam showed 3+ pitting edema to BLE. Lungs CTAB. Remaining exam unremarkable. Labs in the ED tonight showed Cr 9.92, GFR 12. Hgb 8.6. K 5.6. Discussed pt with Dr. Roxanne Mins who also evaluated pt in the ED. Pt given hydralazine and 80mg  IV lasix. Plan to admit pt for further workup/management. Consulted hospitalist, Dr. Tamala Julian advised to consult nephrology regarding admission vs outpatient. Consulted nephrology. Dr. Marval Regal advised to admit and reports he will come see the pt in the morning. Discussed results and plan for admission with pt.    Final Clinical Impressions(s) / ED Diagnoses   Final diagnoses:  Acute renal failure superimposed on stage 5 chronic kidney disease, not on chronic dialysis, unspecified acute renal failure type [facility])    New Prescriptions New Prescriptions   No medications on file     Nona Dell, PA-C 80/99/83 [id number]    Delora Fuel, MD 05/39/76 [id number]

## 2016-07-02 NOTE — ED Triage Notes (Signed)
Pt states sent here to evaluate kidney function. States PCP told pt to come here to see a nephrologist. Pt complaining of some exertional dyspnea and bilateral leg swelling. Pt denies any flank pain. Pt denies any N/V.

## 2016-07-03 ENCOUNTER — Observation Stay (HOSPITAL_COMMUNITY): Payer: BLUE CROSS/BLUE SHIELD

## 2016-07-03 ENCOUNTER — Emergency Department (HOSPITAL_COMMUNITY): Payer: BLUE CROSS/BLUE SHIELD

## 2016-07-03 ENCOUNTER — Other Ambulatory Visit (HOSPITAL_COMMUNITY): Payer: BLUE CROSS/BLUE SHIELD

## 2016-07-03 DIAGNOSIS — Z888 Allergy status to other drugs, medicaments and biological substances status: Secondary | ICD-10-CM | POA: Diagnosis not present

## 2016-07-03 DIAGNOSIS — E785 Hyperlipidemia, unspecified: Secondary | ICD-10-CM | POA: Diagnosis present

## 2016-07-03 DIAGNOSIS — N184 Chronic kidney disease, stage 4 (severe): Secondary | ICD-10-CM | POA: Diagnosis not present

## 2016-07-03 DIAGNOSIS — E875 Hyperkalemia: Secondary | ICD-10-CM

## 2016-07-03 DIAGNOSIS — I5033 Acute on chronic diastolic (congestive) heart failure: Secondary | ICD-10-CM | POA: Diagnosis present

## 2016-07-03 DIAGNOSIS — N181 Chronic kidney disease, stage 1: Secondary | ICD-10-CM

## 2016-07-03 DIAGNOSIS — D638 Anemia in other chronic diseases classified elsewhere: Secondary | ICD-10-CM | POA: Diagnosis present

## 2016-07-03 DIAGNOSIS — I132 Hypertensive heart and chronic kidney disease with heart failure and with stage 5 chronic kidney disease, or end stage renal disease: Secondary | ICD-10-CM | POA: Diagnosis present

## 2016-07-03 DIAGNOSIS — N170 Acute kidney failure with tubular necrosis: Secondary | ICD-10-CM | POA: Diagnosis not present

## 2016-07-03 DIAGNOSIS — Z79899 Other long term (current) drug therapy: Secondary | ICD-10-CM | POA: Diagnosis not present

## 2016-07-03 DIAGNOSIS — Z6839 Body mass index (BMI) 39.0-39.9, adult: Secondary | ICD-10-CM | POA: Diagnosis not present

## 2016-07-03 DIAGNOSIS — N185 Chronic kidney disease, stage 5: Secondary | ICD-10-CM | POA: Diagnosis present

## 2016-07-03 DIAGNOSIS — R600 Localized edema: Secondary | ICD-10-CM | POA: Diagnosis present

## 2016-07-03 DIAGNOSIS — N179 Acute kidney failure, unspecified: Principal | ICD-10-CM

## 2016-07-03 DIAGNOSIS — Z794 Long term (current) use of insulin: Secondary | ICD-10-CM | POA: Diagnosis not present

## 2016-07-03 DIAGNOSIS — E1169 Type 2 diabetes mellitus with other specified complication: Secondary | ICD-10-CM | POA: Diagnosis not present

## 2016-07-03 DIAGNOSIS — I2510 Atherosclerotic heart disease of native coronary artery without angina pectoris: Secondary | ICD-10-CM | POA: Diagnosis present

## 2016-07-03 DIAGNOSIS — N186 End stage renal disease: Secondary | ICD-10-CM | POA: Diagnosis present

## 2016-07-03 DIAGNOSIS — E1122 Type 2 diabetes mellitus with diabetic chronic kidney disease: Secondary | ICD-10-CM | POA: Diagnosis present

## 2016-07-03 DIAGNOSIS — Z8507 Personal history of malignant neoplasm of pancreas: Secondary | ICD-10-CM | POA: Diagnosis not present

## 2016-07-03 DIAGNOSIS — I160 Hypertensive urgency: Secondary | ICD-10-CM | POA: Diagnosis present

## 2016-07-03 DIAGNOSIS — E669 Obesity, unspecified: Secondary | ICD-10-CM | POA: Diagnosis present

## 2016-07-03 DIAGNOSIS — I872 Venous insufficiency (chronic) (peripheral): Secondary | ICD-10-CM | POA: Diagnosis present

## 2016-07-03 DIAGNOSIS — I517 Cardiomegaly: Secondary | ICD-10-CM | POA: Diagnosis not present

## 2016-07-03 DIAGNOSIS — M7989 Other specified soft tissue disorders: Secondary | ICD-10-CM | POA: Diagnosis present

## 2016-07-03 DIAGNOSIS — N189 Chronic kidney disease, unspecified: Secondary | ICD-10-CM | POA: Diagnosis present

## 2016-07-03 LAB — RAPID URINE DRUG SCREEN, HOSP PERFORMED
Amphetamines: NOT DETECTED
BARBITURATES: NOT DETECTED
Benzodiazepines: NOT DETECTED
Cocaine: NOT DETECTED
OPIATES: NOT DETECTED
TETRAHYDROCANNABINOL: NOT DETECTED

## 2016-07-03 LAB — BASIC METABOLIC PANEL
ANION GAP: 7 (ref 5–15)
BUN: 61 mg/dL — ABNORMAL HIGH (ref 6–20)
CALCIUM: 8.6 mg/dL — AB (ref 8.9–10.3)
CO2: 21 mmol/L — ABNORMAL LOW (ref 22–32)
Chloride: 111 mmol/L (ref 101–111)
Creatinine, Ser: 4.87 mg/dL — ABNORMAL HIGH (ref 0.61–1.24)
GFR calc non Af Amer: 12 mL/min — ABNORMAL LOW (ref >60)
GFR, EST AFRICAN AMERICAN: 14 mL/min — AB (ref >60)
Glucose, Bld: 89 mg/dL (ref 65–99)
POTASSIUM: 5.4 mmol/L — AB (ref 3.5–5.1)
SODIUM: 139 mmol/L (ref 135–145)

## 2016-07-03 LAB — CREATININE, URINE, RANDOM: Creatinine, Urine: 47.67 mg/dL

## 2016-07-03 LAB — IRON AND TIBC
IRON: 29 ug/dL — AB (ref 45–182)
Saturation Ratios: 12 % — ABNORMAL LOW (ref 17.9–39.5)
TIBC: 249 ug/dL — ABNORMAL LOW (ref 250–450)
UIBC: 220 ug/dL

## 2016-07-03 LAB — PROTEIN / CREATININE RATIO, URINE
CREATININE, URINE: 47.95 mg/dL
Protein Creatinine Ratio: 4.57 mg/mg{Cre} — ABNORMAL HIGH (ref 0.00–0.15)
Total Protein, Urine: 219 mg/dL

## 2016-07-03 LAB — GLUCOSE, CAPILLARY
GLUCOSE-CAPILLARY: 119 mg/dL — AB (ref 65–99)
GLUCOSE-CAPILLARY: 105 mg/dL — AB (ref 65–99)
Glucose-Capillary: 81 mg/dL (ref 65–99)

## 2016-07-03 LAB — FERRITIN: FERRITIN: 107 ng/mL (ref 24–336)

## 2016-07-03 LAB — SODIUM, URINE, RANDOM: SODIUM UR: 106 mmol/L

## 2016-07-03 LAB — BRAIN NATRIURETIC PEPTIDE: B Natriuretic Peptide: 159 pg/mL — ABNORMAL HIGH (ref 0.0–100.0)

## 2016-07-03 MED ORDER — FUROSEMIDE 10 MG/ML IJ SOLN
80.0000 mg | Freq: Two times a day (BID) | INTRAMUSCULAR | Status: DC
  Administered 2016-07-03 – 2016-07-06 (×7): 80 mg via INTRAVENOUS
  Filled 2016-07-03 (×7): qty 8

## 2016-07-03 MED ORDER — ACETAMINOPHEN 650 MG RE SUPP: 650.0000 mg | Freq: Four times a day (QID) | RECTAL | Status: DC | PRN

## 2016-07-03 MED ORDER — FUROSEMIDE 10 MG/ML IJ SOLN
80.0000 mg | Freq: Once | INTRAMUSCULAR | Status: AC
  Administered 2016-07-03: 80 mg via INTRAVENOUS
  Filled 2016-07-03: qty 8

## 2016-07-03 MED ORDER — SODIUM CHLORIDE 0.9 % IV SOLN: INTRAVENOUS | Status: DC

## 2016-07-03 MED ORDER — PRAVASTATIN SODIUM 40 MG PO TABS
80.0000 mg | ORAL_TABLET | Freq: Every day | ORAL | Status: DC
  Administered 2016-07-03 – 2016-07-05 (×3): 80 mg via ORAL
  Filled 2016-07-03 (×3): qty 2

## 2016-07-03 MED ORDER — ALBUTEROL SULFATE (2.5 MG/3ML) 0.083% IN NEBU: 2.5000 mg | INHALATION_SOLUTION | RESPIRATORY_TRACT | Status: DC | PRN

## 2016-07-03 MED ORDER — ONDANSETRON HCL 4 MG/2ML IJ SOLN: 4.0000 mg | Freq: Four times a day (QID) | INTRAMUSCULAR | Status: DC | PRN

## 2016-07-03 MED ORDER — ORAL CARE MOUTH RINSE: 15.0000 mL | Freq: Two times a day (BID) | OROMUCOSAL | Status: DC

## 2016-07-03 MED ORDER — CARVEDILOL 12.5 MG PO TABS
25.0000 mg | ORAL_TABLET | Freq: Two times a day (BID) | ORAL | Status: DC
  Administered 2016-07-03 – 2016-07-06 (×7): 25 mg via ORAL
  Filled 2016-07-03 (×7): qty 2

## 2016-07-03 MED ORDER — INSULIN GLARGINE 100 UNIT/ML ~~LOC~~ SOLN
18.0000 [IU] | Freq: Every day | SUBCUTANEOUS | Status: DC
  Administered 2016-07-04: 18 [IU] via SUBCUTANEOUS
  Filled 2016-07-03 (×2): qty 0.18

## 2016-07-03 MED ORDER — ONDANSETRON HCL 4 MG PO TABS: 4.0000 mg | ORAL_TABLET | Freq: Four times a day (QID) | ORAL | Status: DC | PRN

## 2016-07-03 MED ORDER — INSULIN GLARGINE 100 UNIT/ML ~~LOC~~ SOLN
23.0000 [IU] | Freq: Every day | SUBCUTANEOUS | Status: DC
  Administered 2016-07-03: 23 [IU] via SUBCUTANEOUS
  Filled 2016-07-03: qty 0.23

## 2016-07-03 MED ORDER — ACETAMINOPHEN 325 MG PO TABS: 650.0000 mg | ORAL_TABLET | Freq: Four times a day (QID) | ORAL | Status: DC | PRN

## 2016-07-03 MED ORDER — INSULIN ASPART 100 UNIT/ML ~~LOC~~ SOLN
0.0000 [IU] | Freq: Three times a day (TID) | SUBCUTANEOUS | Status: DC
Start: 2016-07-03 — End: 2016-07-06

## 2016-07-03 MED ORDER — HYDRALAZINE HCL 20 MG/ML IJ SOLN: 10.0000 mg | INTRAMUSCULAR | Status: DC | PRN

## 2016-07-03 MED ORDER — HEPARIN SODIUM (PORCINE) 5000 UNIT/ML IJ SOLN
5000.0000 [IU] | Freq: Three times a day (TID) | INTRAMUSCULAR | Status: DC
  Administered 2016-07-03 – 2016-07-06 (×9): 5000 [IU] via SUBCUTANEOUS
  Filled 2016-07-03 (×7): qty 1

## 2016-07-03 NOTE — ED Notes (Signed)
Pt requested results from chest X-ray and blood test MD aware

## 2016-07-03 NOTE — Progress Notes (Signed)
Patient seen and examined   Scott Castillo is a 56 y.o. male with medical history significant of HTN, DM type II, chronic kidney disease; who presents for worsening shortness of breath. Patient notes symptoms have been progressively worsening over the last 1 month. He states that his GFR was somewhere around 21 last summer.Pt complaining of some exertional dyspnea and bilateral leg swelling Initial lab work revealed hemoglobin 8.6, potassium 5.6, BUN 62, creatinine 4.92. Initial blood pressures were elevated up to 223/103. Patient was able to maintain O2 saturations on room air. While in the ED he was given 50 mg of hydralazine in the ED and 80 mg of Lasix IV. Nephrology was consulted and will see the patient in the a.m.renal USG wnl  Lower dose of lantus since cbg low normal ,started [facility] Nephrology to consult for CKD Echo to evaluate EF given hypertensive urgency,chf ,check UDS

## 2016-07-03 NOTE — ED Notes (Signed)
Report called by Lilia Pro to [facility] on 6E.

## 2016-07-03 NOTE — H&P (Signed)
History and Physical    Scott Castillo ZWC:[id number] DOB: [birth year] DOA: 07/02/2016  Referring MD/NP/PA: Myrtice Lauth, PA-C PCP: Teressa Lower, MD  Patient coming from: PCP  Chief Complaint: Shortness of breath  HPI: Scott Castillo is a 56 y.o. male with medical history significant of HTN, DM type II, chronic kidney disease; who presents for worsening shortness of breath. Patient notes symptoms have been progressively worsening over the last 1 month. He states that his GFR was somewhere around 21 last summer. At that time he was referred to nephrology and evaluated by Dr. Florene Glen, but had difficulty scheduling follow-up appointments with his work schedule. Over the last month at work he noted that he's been more short of breath and even had to push a cart around just to do his job at work. Associated symptoms include leg swelling, cough, and weight gain. At baseline patient notes that his weight was somewhere around 270 pounds, but had ballooned up to 305. When he follow-up with his PCP at the beginning of the week and was started on Lasix at that time. Lab work was obtained which revealed his kidney function had dropped to a GFR of 12 per the patient. Patient still reports being able to make urine. His PCP tried to get him in urgently with Dr. Florene Glen, but patient's symptoms worsen and therefore was advised to come to the emergency department for further treatment.  ED Course: Initial lab work revealed hemoglobin 8.6, potassium 5.6, BUN 62, creatinine 4.92. Initial blood pressures were elevated up to 223/103. Patient was able to maintain O2 saturations on room air. While in the ED he was given 50 mg of hydralazine in the ED and 80 mg of Lasix IV. Nephrology was consulted and will see the patient in the a.m.   Review of Systems: As per HPI otherwise 10 point review of systems negative.   Past Medical History:  Diagnosis Date  . Venous stasis dermatitis of both lower extremities     Past  Surgical History:  Procedure Laterality Date  . ENDOVENOUS ABLATION SAPHENOUS VEIN W/ LASER Right 04/08/2016   EVLA R greater saphenous vein by Curt Jews MD  . ENDOVENOUS ABLATION SAPHENOUS VEIN W/ LASER Left 05/13/2016   endovenous laser ablation (left greater saphenous vein) by Curt Jews MD      reports that he has never smoked. He has never used smokeless tobacco. He reports that he does not drink alcohol or use drugs.  Allergies  Allergen Reactions  . Pioglitazone Swelling    History reviewed. No pertinent family history.  Prior to Admission medications   Medication Sig Start Date End Date Taking? Authorizing Provider  carvedilol (COREG) 25 MG tablet Take 25 mg by mouth. 07/28/15   Historical Provider, MD  Cholecalciferol (VITAMIN D3) 1000 units CAPS Take by mouth.    Historical Provider, MD  CINNAMON PO Take by mouth.    Historical Provider, MD  co-enzyme Q-10 50 MG capsule Take 100 mg by mouth daily.    Historical Provider, MD  DANDELION PO Take by mouth.    Historical Provider, MD  Horse Chestnut 300 MG CAPS Take by mouth.    Historical Provider, MD  Insulin Degludec 200 UNIT/ML SOPN Inject into the skin. TRESIBA 46 Units 11/11/15   Historical Provider, MD  Insulin Pen Needle (BD ULTRA-FINE PEN NEEDLES) 29G X 12.7MM MISC USE AS DIRECTED EVERY DAY 01/15/16   Historical Provider, MD  Misc Natural Products (ECHINACEA COMPOUND PO) Take by mouth.  Historical Provider, MD  Misc Natural Products (OSTEO BI-FLEX TRIPLE STRENGTH) TABS Take by mouth.    Historical Provider, MD  Multiple Vitamin (MULTIVITAMIN) tablet Take 1 tablet by mouth daily.    Historical Provider, MD  pravastatin (PRAVACHOL) 80 MG tablet Take 80 mg by mouth.    Historical Provider, MD  Probiotic Product (CVS ADV PROBIOTIC GUMMIES PO) Take by mouth.    Historical Provider, MD  sitaGLIPtin (JANUVIA) 50 MG tablet Take 50 mg by mouth.    Historical Provider, MD  TURMERIC PO Take 720 mg by mouth 2 (two) times daily.     Historical Provider, MD  valsartan (DIOVAN) 160 MG tablet TAKE 1 TABLET (160 MG TOTAL) BY MOUTH EVERY MORNING. HIGH BLOOD PRESSURE 01/19/16   Historical Provider, MD  vitamin B-12 (CYANOCOBALAMIN) 500 MCG tablet Take 500 mcg by mouth daily.    Historical Provider, MD  vitamin C (ASCORBIC ACID) 500 MG tablet Take 500 mg by mouth daily.    Historical Provider, MD  vitamin E 1000 UNIT capsule Take 1,000 Units by mouth daily.    Historical Provider, MD    Physical Exam:  Constitutional: Obese male in NAD, calm, comfortable Vitals:   07/02/16 1855 07/02/16 2236 07/02/16 2344 07/02/16 2351  BP: (!) 194/83 (!) 177/87 (!) 223/103 (!) 191/83  Pulse: 83 70 (!) 59 61  Resp: (!) 22 18 20  (!) 22  Temp: 98.3 F (36.8 C)     TempSrc: Oral     SpO2: 96% 97% 97% 99%  Weight: 135.2 kg (298 lb)     Height: 6\' 1"  (1.854 m)      Eyes: PERRL, lids and conjunctivae normal ENMT: Mucous membranes are moist. Posterior pharynx clear of any exudate or lesions. Normal dentition.  Neck: normal, supple, no masses, no thyromegaly Respiratory: Decreased overall aeration. Can appreciate intermittent crackles. No wheezing. Normal respiratory effort. No accessory muscle use.  Cardiovascular: Regular rate and rhythm, no murmurs / rubs / gallops. 3+ pitting lower extremity edema. 2+ pedal pulses. No carotid bruits.  Abdomen: no tenderness, no masses palpated. No hepatosplenomegaly. Bowel sounds positive.  Musculoskeletal: no clubbing / cyanosis. No joint deformity upper and lower extremities. Good ROM, no contractures. Normal muscle tone.  Skin: Significantly dry skin on the bilateral lower extremities. Neurologic: CN 2-12 grossly intact. Sensation intact, DTR normal. Strength 5/5 in all 4.  Psychiatric: Normal judgment and insight. Alert and oriented x 3. Normal mood.     Labs on Admission: I have personally reviewed following labs and imaging studies  CBC:  Recent Labs Lab 07/02/16 1914  WBC 7.2  NEUTROABS 5.0   HGB 8.6*  HCT 27.1*  MCV 89.4  PLT 578   Basic Metabolic Panel:  Recent Labs Lab 07/02/16 1914  NA 139  K 5.6*  CL 110  CO2 20*  GLUCOSE 84  BUN 62*  CREATININE 4.92*  CALCIUM 8.5*   GFR: Estimated Creatinine Clearance: 24.8 mL/min (A) (by C-G formula based on SCr of 4.92 mg/dL (H)). Liver Function Tests:  Recent Labs Lab 07/02/16 1914  AST 15  ALT 14*  ALKPHOS 63  BILITOT 0.4  PROT 6.3*  ALBUMIN 2.9*   No results for input(s): LIPASE, AMYLASE in the last 168 hours. No results for input(s): AMMONIA in the last 168 hours. Coagulation Profile: No results for input(s): INR, PROTIME in the last 168 hours. Cardiac Enzymes: No results for input(s): CKTOTAL, CKMB, CKMBINDEX, TROPONINI in the last 168 hours. BNP (last 3 results) No results for  input(s): PROBNP in the last 8760 hours. HbA1C: No results for input(s): HGBA1C in the last 72 hours. CBG: No results for input(s): GLUCAP in the last 168 hours. Lipid Profile: No results for input(s): CHOL, HDL, LDLCALC, TRIG, CHOLHDL, LDLDIRECT in the last 72 hours. Thyroid Function Tests: No results for input(s): TSH, T4TOTAL, FREET4, T3FREE, THYROIDAB in the last 72 hours. Anemia Panel: No results for input(s): VITAMINB12, FOLATE, FERRITIN, TIBC, IRON, RETICCTPCT in the last 72 hours. Urine analysis:    Component Value Date/Time   COLORURINE STRAW (A) 07/02/2016 1910   APPEARANCEUR CLEAR 07/02/2016 1910   LABSPEC 1.011 07/02/2016 1910   PHURINE 5.0 07/02/2016 1910   GLUCOSEU 50 (A) 07/02/2016 1910   HGBUR SMALL (A) 07/02/2016 1910   BILIRUBINUR NEGATIVE 07/02/2016 [place] NEGATIVE 07/02/2016 1910   PROTEINUR 100 (A) 07/02/2016 1910   NITRITE NEGATIVE 07/02/2016 1910   LEUKOCYTESUR NEGATIVE 07/02/2016 1910   Sepsis Labs: No results found for this or any previous visit (from the past 240 hour(s)).   Radiological Exams on Admission: No results found.  EKG: Independently reviewed. Sinus  rhythm  Assessment/Plan ARF (acute renal failure) on chronic kidney disease stage IV: Patient with progressive decline in renal function over the last year. Previously, noted to have been evaluated by Dr. Florene Glen of nephrology. - Admit to a telemetry bed - Recheck BMP in a.m. - Check renal ultrasound - Appreciate nephrology consultative services, follow-up for further recommendations  Fluid overload with CHF exacerbation: Initial chest x-ray revealing cardiac enlargement with bilateral interstitial edema and small bilateral pleural effusions. Patient was given 80mg  mg of Lasix IV in the ED. - Strict ins and outs and daily weights - Reassess in a.m. with nephrology guidance and determine if further diuresis is warranted - Check echocardiogram in a.m.   Hyperkalemia: Initial potassium 5.6 on admission. No significant EKG changes appreciated Patient given 80 mEq of Lasix. - Held valsartan - Repeat potassium level in a.m.  Diabetes mellitus type 2 - Hypoglycemic protocols  - Held Januvia - CBGs every before meals and at bedtime with sensitive SSI - Decreased home dose of Tresiba by half to 23 units subcutaneously as patient in more controlled environment - adjust insulin regimen as needed  Hypertensive urgency: Systolic blood pressures on admission noted to be in the 200s.  - Continue Coreg  - Hydralazine IV prn - Valsartan on hold secondary to hyperkalemia  Hyperlipidemia - Continue pravastatin   Anemia of chronic disease: hemoglobin 8 on admission. - Continue to monitor    DVT prophylaxis: heparin Code Status: Full   Family Communication: No family present at bedside Disposition Plan: Likely discharge home once medically stable  Consults called: Nephrology  Admission status: observation  Norval Morton MD [facility] Pager [phone]  If 7PM-7AM, please contact night-coverage [email or web address] Password [facility]  07/03/2016, 12:27 AM

## 2016-07-03 NOTE — Consult Note (Addendum)
Reason for Consult: Acute kidney injury and chronic kidney disease stage IV, volume overload Referring Physician: Reyne Dumas M.D. [facility])  HPI:  56 year old Caucasian man with past medical history significant for hypertension, type 2 diabetes mellitus for the past 25 years with history of pancreatic adenocarcinoma that was treated and what appears to be consequent chronic kidney disease stage IV at baseline with progression over the past few years. When he last saw Dr. Florene Glen back in 2015, his creatinine was 1.6 and in 2016 creatinine was 1.9- concern was raised earlier this week when he saw his primary care provider for volume overload when his creatinine was noted to be 3.1. Diuretic therapy was adjusted but after failure of improvement and increased work of breathing, he was instructed to come to the emergency room for further management. He was previously not on diuretic therapy and reports that he continued to take his valsartan as prescribed.  He reports having undergone saphenous vein ablation of the right and left legs between December and January (3 months ago) and this was followed by an episode of cellulitis for which he took Augmentin for a few weeks. He now reports intermittent diarrhea but denies any nausea and vomiting. He denies any obstructive or irritative symptoms. He denies any chest pain and reports shortness of breath as being exertional. He reports that worsening pedal edema as being the main problem over the past few weeks (weight has gone up by almost 25 pounds from his baseline).   Past Medical History:  Diagnosis Date  . Venous stasis dermatitis of both lower extremities     Past Surgical History:  Procedure Laterality Date  . ENDOVENOUS ABLATION SAPHENOUS VEIN W/ LASER Right 04/08/2016   EVLA R greater saphenous vein by Curt Jews MD  . ENDOVENOUS ABLATION SAPHENOUS VEIN W/ LASER Left 05/13/2016   endovenous laser ablation (left greater saphenous vein) by Curt Jews MD      History reviewed. No pertinent family history.  Social History:  reports that he has never smoked. He has never used smokeless tobacco. He reports that he does not drink alcohol or use drugs.  Allergies:  Allergies  Allergen Reactions  . Pioglitazone Swelling    Medications:  Scheduled: . carvedilol  25 mg Oral BID WC  . heparin  5,000 Units Subcutaneous Q8H  . insulin aspart  0-9 Units Subcutaneous TID WC  . [START ON 07/04/2016] insulin glargine  18 Units Subcutaneous Daily  . pravastatin  80 mg Oral q1800    BMP Latest Ref Rng & Units 07/02/2016  Glucose 65 - 99 mg/dL 84  BUN 6 - 20 mg/dL 62(H)  Creatinine 0.61 - 1.24 mg/dL 4.92(H)  Sodium 135 - 145 mmol/L 139  Potassium 3.5 - 5.1 mmol/L 5.6(H)  Chloride 101 - 111 mmol/L 110  CO2 22 - 32 mmol/L 20(L)  Calcium 8.9 - 10.3 mg/dL 8.5(L)   CBC Latest Ref Rng & Units 07/02/2016  WBC 4.0 - 10.5 K/uL 7.2  Hemoglobin 13.0 - 17.0 g/dL 8.6(L)  Hematocrit 39.0 - 52.0 % 27.1(L)  Platelets 150 - 400 K/uL 216     Dg Chest 2 View  Result Date: 07/03/2016 CLINICAL DATA:  Shortness of breath. Exertional dyspnea, bilateral leg swelling. History of hypertension and diabetes. EXAM: CHEST  2 VIEW COMPARISON:  None. FINDINGS: Mild cardiac enlargement. Minimal if any pulmonary vascular congestion but there is diffuse interstitial pattern to the lungs and small bilateral pleural effusions suggesting fluid overload and edema. No focal consolidation or volume  loss. No pneumothorax. Mild degenerative changes in the spine. Tortuous aorta. IMPRESSION: Cardiac enlargement. Bilateral interstitial edema and small bilateral pleural effusions. Electronically Signed   By: Lucienne Capers M.D.   On: 07/03/2016 00:31   US Renal  Result Date: 07/03/2016 CLINICAL DATA:  Acute renal failure 1 day. History of diabetes and hypertension. EXAM: RENAL / URINARY TRACT ULTRASOUND COMPLETE COMPARISON:  None. FINDINGS: Right Kidney: Length: 12.0 cm. Slight increased  cortical echogenicity. No mass or hydronephrosis visualized. Left Kidney: Length: 13.2 cm. Slight increased cortical echogenicity. No mass or hydronephrosis visualized. Bladder: Appears normal for degree of bladder distention. Small bilateral pleural effusions. IMPRESSION: Normal size kidneys without hydronephrosis. Subtle increased cortical echogenicity which can be seen with medical renal disease. Small bilateral pleural effusions. Electronically Signed   By: Marin Olp M.D.   On: 07/03/2016 07:21    Review of Systems  Constitutional: Positive for malaise/fatigue. Negative for chills, fever and weight loss.  HENT: Positive for congestion. Negative for ear discharge and nosebleeds.   Eyes: Negative.   Respiratory: Positive for cough and shortness of breath. Negative for hemoptysis and sputum production.   Cardiovascular: Positive for orthopnea and leg swelling. Negative for chest pain and palpitations.  Gastrointestinal: Positive for diarrhea. Negative for abdominal pain, nausea and vomiting.  Genitourinary: Negative.   Musculoskeletal: Negative.   Skin: Negative.   Neurological: Negative for dizziness, sensory change and headaches.   Blood pressure (!) 155/76, pulse 70, temperature 98.7 F (37.1 C), temperature source Oral, resp. rate 16, height 6\' 1"  (1.854 m), weight 135.7 kg (299 lb 1.6 oz), SpO2 94 %. Physical Exam  Nursing note and vitals reviewed. Constitutional: He is oriented to person, place, and time. He appears well-developed and well-nourished. No distress.  HENT:  Head: Normocephalic and atraumatic.  Mouth/Throat: Oropharynx is clear and moist.  Eyes: Conjunctivae and EOM are normal. Pupils are equal, round, and reactive to light.  Neck: Normal range of motion. Neck supple. JVD present.  JVP 14 cm  Cardiovascular: Normal rate and regular rhythm.   Murmur heard. 3/6 Ejection systolic murmur over apex  Respiratory: Effort normal. He has rales.  Fine rales left base. No  rhonchi.  GI: Soft. Bowel sounds are normal. There is no tenderness. There is no rebound.  Umbilical hernia-reducible  Musculoskeletal: He exhibits edema.  3-4+ edema bipedal  Neurological: He is alert and oriented to person, place, and time.  Skin: Skin is warm and dry.  Chronic venous stasis changes of her lower extremities    Assessment/Plan: 1. Acute kidney injury on chronic kidney disease stage III versus progression of chronic kidney disease: The patient's underlying chronic kidney disease as previously worked up appears to be from a combination of diabetes and hypertension and current lab data suggestive of acute worsening likely from hemodynamically mediated mechanism/renal hypoperfusion with volume overload. Hold ARB at this time and continue titration of diuretic therapy for volume unloading. We'll check urine electrolytes and urine protein/creatinine ratio. Repeat ultrasound did not display any hydronephrosis and shows features consistent with diabetic kidney disease with relatively large renal size bilaterally. 2. Volume overload/CHF exacerbation: Adjust diuretic therapy this time for appropriate diuresis, low sodium diet and hold ARB. Await findings of repeat echocardiogram, continue beta blocker. 3. Hyperkalemia: Mild, recheck labs again this morning to decide on need for potassium lowering therapy. Anticipate will improve with ongoing diuresis. 4. Anemia: Anemia of chronic disease/chronic kidney disease, he denies any overt losses. Check iron studies and decide on replacement versus ESA  therapy. 5. Hypertension: Suspect to have been exacerbated by volume overload, monitor with diuresis.    Kerwin Augustus K. 07/03/2016, 11:17 AM

## 2016-07-03 NOTE — ED Notes (Signed)
Pt and family made aware of bed assignment 

## 2016-07-04 LAB — RENAL FUNCTION PANEL
Albumin: 2.7 g/dL — ABNORMAL LOW (ref 3.5–5.0)
Anion gap: 4 — ABNORMAL LOW (ref 5–15)
BUN: 62 mg/dL — ABNORMAL HIGH (ref 6–20)
CHLORIDE: 113 mmol/L — AB (ref 101–111)
CO2: 23 mmol/L (ref 22–32)
CREATININE: 4.86 mg/dL — AB (ref 0.61–1.24)
Calcium: 8.5 mg/dL — ABNORMAL LOW (ref 8.9–10.3)
GFR calc non Af Amer: 12 mL/min — ABNORMAL LOW (ref >60)
GFR, EST AFRICAN AMERICAN: 14 mL/min — AB (ref >60)
Glucose, Bld: 106 mg/dL — ABNORMAL HIGH (ref 65–99)
POTASSIUM: 5.3 mmol/L — AB (ref 3.5–5.1)
Phosphorus: 4.5 mg/dL (ref 2.5–4.6)
Sodium: 140 mmol/L (ref 135–145)

## 2016-07-04 LAB — GLUCOSE, CAPILLARY
GLUCOSE-CAPILLARY: 101 mg/dL — AB (ref 65–99)
GLUCOSE-CAPILLARY: 91 mg/dL (ref 65–99)
GLUCOSE-CAPILLARY: 116 mg/dL — AB (ref 65–99)
Glucose-Capillary: 102 mg/dL — ABNORMAL HIGH (ref 65–99)

## 2016-07-04 LAB — HEMOGLOBIN A1C
Hgb A1c MFr Bld: 5.2 % (ref 4.8–5.6)
Mean Plasma Glucose: 103 mg/dL

## 2016-07-04 LAB — UREA NITROGEN, URINE: UREA NITROGEN UR: 266 mg/dL

## 2016-07-04 LAB — MAGNESIUM: MAGNESIUM: 1.7 mg/dL (ref 1.7–2.4)

## 2016-07-04 MED ORDER — HYDRALAZINE HCL 25 MG PO TABS
25.0000 mg | ORAL_TABLET | Freq: Three times a day (TID) | ORAL | Status: DC
  Administered 2016-07-04 – 2016-07-06 (×6): 25 mg via ORAL
  Filled 2016-07-04 (×6): qty 1

## 2016-07-04 NOTE — Progress Notes (Signed)
[facility] PROGRESS NOTE  Scott Castillo:[id number] DOB: May 09, 1960 DOA: 07/02/2016   PCP: Scott Lower, MD     Assessment/Plan: Principal Problem:   Acute renal failure superimposed on chronic kidney disease ([place]) Active Problems:   Diabetes mellitus type 2 in obese ([facility])   Hyperkalemia   Hypertensive urgency   Hyperlipidemia   Anemia of chronic disease   ARF (acute renal failure) ([place])  Scott Castillo a 56 y.o.malewith medical history significant of HTN, DM type II, chronic kidney disease;who presents for worsening shortness of breath. Patient notes symptoms have been progressively worsening over the last 1 month. He states that his GFR was somewhere around 21 last summer.Pt complaining of some exertional dyspnea and bilateral leg swelling Initial lab work revealed hemoglobin 8.6, potassium 5.6, BUN 62, creatinine 4.92. Initial blood pressures wereelevated upto 223/103. Patientwas able to maintain O2 saturations on room air. While in the ED he was given 50 mg of hydralazine in the ED and 80 mg of Lasix IV. Nephrology was consulted for acute on chronic kidney disease management  Assessment and plan  ARF (acute renal failure) on chronic kidney disease stage IV: Patient with progressive decline in renal function over the last year. Previously, noted to have been evaluated by Dr. Florene Glen of nephrology. Telemetry shows normal sinus rhythm Responding well to diuretics. 4850 cc out, cr stable  Renal ultrasound within normal limits - Appreciate nephrology  follow-up   recommendations  Fluid overload with CHF exacerbation: Initial chest x-ray revealing cardiac enlargement with bilateral interstitial edema and small bilateral pleural effusions. Patient was given 80mg  mg of Lasix IV in the ED., continue Lasix and Coreg - Strict ins and outs and daily weights - Continue diuretics per nephrology - Check echocardiogram in a.m.   Hyperkalemia: Initial potassium 5.6  on admission. 5.3 today. - Held valsartan - Repeat potassium level in a.m.  Diabetes mellitus type 2 - Hypoglycemic protocols  - Held Januvia - CBGs every before meals and at bedtime with sensitive SSI Dose of Lantus was reduced yesterday to 18 units   Hypertensive urgency: Systolic blood pressures on admission noted to be in the 200s. Blood pressure somewhat improved on IV Lasix, Coreg, by mouth hydralazine,  - Hydralazine IV prn - Valsartan on hold secondary to hyperkalemia  Hyperlipidemia - Continue pravastatin   Anemia of chronic disease: hemoglobin 8 on admission. - Continue to monitor     DVT prophylaxsis heparin  Code Status:  Full code     Family Communication: Discussed in detail with the patient, all imaging results, lab results explained to the patient   Disposition Plan: As per nephrology   Consultants:  Nephrology  Procedures:  None  Antibiotics: Anti-infectives    None         HPI/Subjective: debies cp, sob,feels swelling is getting better  Objective: Vitals:   07/03/16 0900 07/03/16 1700 07/03/16 2225 07/04/16 0424  BP: (!) 155/76 (!) 169/79 (!) 170/84 (!) 170/82  Pulse: 70 78 68 62  Resp: 16 18 18 18   Temp: 98.7 F (37.1 C) 98.2 F (36.8 C) 97.7 F (36.5 C) 97.8 F (36.6 C)  TempSrc: Oral Oral Oral Oral  SpO2: 94% 98% 98% 98%  Weight:   133.2 kg (293 lb 10.4 oz)   Height:        Intake/Output Summary (Last 24 hours) at 07/04/16 0950 Last data filed at 07/04/16 0705  Gross per 24 hour  Intake  1080 ml  Output             4875 ml  Net            -3795 ml    Exam:  Examination:  General exam: Appears calm and comfortable  Respiratory system: Clear to auscultation. Respiratory effort normal. Cardiovascular system: S1 & S2 heard, RRR. No JVD, murmurs, rubs, gallops or clicks. No pedal edema. Gastrointestinal system: Abdomen is nondistended, soft and nontender. No organomegaly or masses felt. Normal bowel  sounds heard. Central nervous system: Alert and oriented. No focal neurological deficits. Extremities: Symmetric 5 x 5 power. Skin: No rashes, lesions or ulcers Psychiatry: Judgement and insight appear normal. Mood & affect appropriate.     Data Reviewed: I have personally reviewed following labs and imaging studies  Micro Results No results found for this or any previous visit (from the past 240 hour(s)).  Radiology Reports Dg Chest 2 View  Result Date: 07/03/2016 CLINICAL DATA:  Shortness of breath. Exertional dyspnea, bilateral leg swelling. History of hypertension and diabetes. EXAM: CHEST  2 VIEW COMPARISON:  None. FINDINGS: Mild cardiac enlargement. Minimal if any pulmonary vascular congestion but there is diffuse interstitial pattern to the lungs and small bilateral pleural effusions suggesting fluid overload and edema. No focal consolidation or volume loss. No pneumothorax. Mild degenerative changes in the spine. Tortuous aorta. IMPRESSION: Cardiac enlargement. Bilateral interstitial edema and small bilateral pleural effusions. Electronically Signed   By: Lucienne Capers M.D.   On: 07/03/2016 00:31   US Renal  Result Date: 07/03/2016 CLINICAL DATA:  Acute renal failure 1 day. History of diabetes and hypertension. EXAM: RENAL / URINARY TRACT ULTRASOUND COMPLETE COMPARISON:  None. FINDINGS: Right Kidney: Length: 12.0 cm. Slight increased cortical echogenicity. No mass or hydronephrosis visualized. Left Kidney: Length: 13.2 cm. Slight increased cortical echogenicity. No mass or hydronephrosis visualized. Bladder: Appears normal for degree of bladder distention. Small bilateral pleural effusions. IMPRESSION: Normal size kidneys without hydronephrosis. Subtle increased cortical echogenicity which can be seen with medical renal disease. Small bilateral pleural effusions. Electronically Signed   By: Marin Olp M.D.   On: 07/03/2016 07:21     CBC  Recent Labs Lab 07/02/16 1914  WBC  7.2  HGB 8.6*  HCT 27.1*  PLT 216  MCV 89.4  MCH 28.4  MCHC 31.7  RDW 14.1  LYMPHSABS 0.9  MONOABS 0.6  EOSABS 0.6  BASOSABS 0.0    Chemistries   Recent Labs Lab 07/02/16 1914 07/03/16 0520 07/04/16 0722  NA 139 139 140  K 5.6* 5.4* 5.3*  CL 110 111 113*  CO2 20* 21* 23  GLUCOSE 84 89 106*  BUN 62* 61* 62*  CREATININE 4.92* 4.87* 4.86*  CALCIUM 8.5* 8.6* 8.5*  MG  --   --  1.7  AST 15  --   --   ALT 14*  --   --   ALKPHOS 63  --   --   BILITOT 0.4  --   --    ------------------------------------------------------------------------------------------------------------------ estimated creatinine clearance is 24.9 mL/min (A) (by C-G formula based on SCr of 4.86 mg/dL (H)). ------------------------------------------------------------------------------------------------------------------ No results for input(s): HGBA1C in the last 72 hours. ------------------------------------------------------------------------------------------------------------------ No results for input(s): CHOL, HDL, LDLCALC, TRIG, CHOLHDL, LDLDIRECT in the last 72 hours. ------------------------------------------------------------------------------------------------------------------ No results for input(s): TSH, T4TOTAL, T3FREE, THYROIDAB in the last 72 hours.  Invalid input(s): FREET3 ------------------------------------------------------------------------------------------------------------------  Recent Labs  07/03/16 1432  FERRITIN 107  TIBC 249*  IRON 29*    Coagulation  profile No results for input(s): INR, PROTIME in the last 168 hours.  No results for input(s): DDIMER in the last 72 hours.  Cardiac Enzymes No results for input(s): CKMB, TROPONINI, MYOGLOBIN in the last 168 hours.  Invalid input(s): CK ------------------------------------------------------------------------------------------------------------------ Invalid input(s): POCBNP   CBG:  Recent Labs Lab  07/03/16 0751 07/03/16 1235 07/03/16 1647 07/04/16 0749  GLUCAP 81 119* 105* 102*       Studies: Dg Chest 2 View  Result Date: 07/03/2016 CLINICAL DATA:  Shortness of breath. Exertional dyspnea, bilateral leg swelling. History of hypertension and diabetes. EXAM: CHEST  2 VIEW COMPARISON:  None. FINDINGS: Mild cardiac enlargement. Minimal if any pulmonary vascular congestion but there is diffuse interstitial pattern to the lungs and small bilateral pleural effusions suggesting fluid overload and edema. No focal consolidation or volume loss. No pneumothorax. Mild degenerative changes in the spine. Tortuous aorta. IMPRESSION: Cardiac enlargement. Bilateral interstitial edema and small bilateral pleural effusions. Electronically Signed   By: Lucienne Capers M.D.   On: 07/03/2016 00:31   US Renal  Result Date: 07/03/2016 CLINICAL DATA:  Acute renal failure 1 day. History of diabetes and hypertension. EXAM: RENAL / URINARY TRACT ULTRASOUND COMPLETE COMPARISON:  None. FINDINGS: Right Kidney: Length: 12.0 cm. Slight increased cortical echogenicity. No mass or hydronephrosis visualized. Left Kidney: Length: 13.2 cm. Slight increased cortical echogenicity. No mass or hydronephrosis visualized. Bladder: Appears normal for degree of bladder distention. Small bilateral pleural effusions. IMPRESSION: Normal size kidneys without hydronephrosis. Subtle increased cortical echogenicity which can be seen with medical renal disease. Small bilateral pleural effusions. Electronically Signed   By: Marin Olp M.D.   On: 07/03/2016 07:21      No results found for: HGBA1C Lab Results  Component Value Date   CREATININE 4.86 (H) 07/04/2016       Scheduled Meds: . carvedilol  25 mg Oral BID WC  . furosemide  80 mg Intravenous BID  . heparin  5,000 Units Subcutaneous Q8H  . hydrALAZINE  25 mg Oral Q8H  . insulin aspart  0-9 Units Subcutaneous TID WC  . insulin glargine  18 Units Subcutaneous Daily  .  pravastatin  80 mg Oral q1800   Continuous Infusions:   LOS: 1 day    Time spent: >30 MINS    [facility]  [facility] Pager [phone]. If 7PM-7AM, please contact night-coverage at [email or web address], password [email or web address] 07/04/2016, 9:50 AM  LOS: 1 day

## 2016-07-04 NOTE — Progress Notes (Signed)
Patient ID: Scott Castillo, male   DOB: [birth year], 56 y.o.   MRN: [id number] [facility] Progress Note   Assessment/ Plan:   1. Acute kidney injury on chronic kidney disease stage III versus progression of chronic kidney disease: Likely with hemodynamically mediated acute on chronic renal insufficiency with CHF exacerbation. Ongoing diuretic therapy at this time with excellent volume unloading overnight. Continue current dose of furosemide 80 mg IV twice a day for at least another 24-48 hours prior to conversion to oral diuretics. Reminded him on the importance of regular renal follow-up as well as compliance with low-sodium diet. 2. Volume overload/CHF exacerbation:  ARB currently on hold with escalation of diuretic therapy-echocardiogram pending. Discussed low sodium diet. 3. Hyperkalemia: Mild, likely from AKI on CKD III versus distal RTA of diabetes. Anticipate will improve with ongoing diuresis. 4. Anemia: Anemia of chronic disease/chronic kidney disease, he denies any overt losses. With low iron saturations-ferritin 107 and saturation ratio of 12%- begin Fereheme 510 mg IV every 72 hours 2 doses. 5. Hypertension:  blood pressures remain elevated on carvedilol and current dose of furosemide. I will transiently start him on hydralazine 25 mg 3 times a day while holding valsartan.  Subjective:   Reports to be feeling somewhat better with improvement of shortness of breath and states that urine is now less darker in color.    Objective:   BP (!) 170/82 (BP Location: Right Arm)   Pulse 62   Temp 97.8 F (36.6 C) (Oral)   Resp 18   Ht 6\' 1"  (1.854 m)   Wt 133.2 kg (293 lb 10.4 oz)   SpO2 98%   BMI 38.74 kg/m   Intake/Output Summary (Last 24 hours) at 07/04/16 3557 Last data filed at 07/04/16 3220  Gross per 24 hour  Intake             1080 ml  Output             4875 ml  Net            -3795 ml   Weight change: -1.972 kg (-4 lb 5.6 oz)  Physical  Exam: [id number] resting in bed, watching television CVS: Pulse regular rhythm, normal rate, S1 and S2 with ejection systolic murmur Resp: Fine rales bibasally, no wheeze/rhonchi Abd: Soft, obese, nontender Ext: 3+ bilateral pitting edema with chronic venous stasis changes  Imaging: Dg Chest 2 View  Result Date: 07/03/2016 CLINICAL DATA:  Shortness of breath. Exertional dyspnea, bilateral leg swelling. History of hypertension and diabetes. EXAM: CHEST  2 VIEW COMPARISON:  None. FINDINGS: Mild cardiac enlargement. Minimal if any pulmonary vascular congestion but there is diffuse interstitial pattern to the lungs and small bilateral pleural effusions suggesting fluid overload and edema. No focal consolidation or volume loss. No pneumothorax. Mild degenerative changes in the spine. Tortuous aorta. IMPRESSION: Cardiac enlargement. Bilateral interstitial edema and small bilateral pleural effusions. Electronically Signed   By: Lucienne Capers M.D.   On: 07/03/2016 00:31   US Renal  Result Date: 07/03/2016 CLINICAL DATA:  Acute renal failure 1 day. History of diabetes and hypertension. EXAM: RENAL / URINARY TRACT ULTRASOUND COMPLETE COMPARISON:  None. FINDINGS: Right Kidney: Length: 12.0 cm. Slight increased cortical echogenicity. No mass or hydronephrosis visualized. Left Kidney: Length: 13.2 cm. Slight increased cortical echogenicity. No mass or hydronephrosis visualized. Bladder: Appears normal for degree of bladder distention. Small bilateral pleural effusions. IMPRESSION: Normal size kidneys without hydronephrosis. Subtle increased cortical echogenicity which can be seen with medical  renal disease. Small bilateral pleural effusions. Electronically Signed   By: Marin Olp M.D.   On: 07/03/2016 07:21    Labs: BMET  Recent Labs Lab 07/02/16 1914 07/03/16 0520 07/04/16 0722  NA 139 139 140  K 5.6* 5.4* 5.3*  CL 110 111 113*  CO2 20* 21* 23  GLUCOSE 84 89 106*  BUN 62* 61* 62*   CREATININE 4.92* 4.87* 4.86*  CALCIUM 8.5* 8.6* 8.5*  PHOS  --   --  4.5   CBC  Recent Labs Lab 07/02/16 1914  WBC 7.2  NEUTROABS 5.0  HGB 8.6*  HCT 27.1*  MCV 89.4  PLT 216   Medications:    . carvedilol  25 mg Oral BID WC  . furosemide  80 mg Intravenous BID  . heparin  5,000 Units Subcutaneous Q8H  . insulin aspart  0-9 Units Subcutaneous TID WC  . insulin glargine  18 Units Subcutaneous Daily  . pravastatin  80 mg Oral q1800   Elmarie Shiley, MD 07/04/2016, 9:09 AM

## 2016-07-05 ENCOUNTER — Inpatient Hospital Stay (HOSPITAL_COMMUNITY): Payer: BLUE CROSS/BLUE SHIELD

## 2016-07-05 DIAGNOSIS — I517 Cardiomegaly: Secondary | ICD-10-CM

## 2016-07-05 LAB — COMPREHENSIVE METABOLIC PANEL
ALK PHOS: 65 U/L (ref 38–126)
ALT: 15 U/L — AB (ref 17–63)
AST: 16 U/L (ref 15–41)
Albumin: 2.8 g/dL — ABNORMAL LOW (ref 3.5–5.0)
Anion gap: 6 (ref 5–15)
BUN: 62 mg/dL — ABNORMAL HIGH (ref 6–20)
CALCIUM: 8.5 mg/dL — AB (ref 8.9–10.3)
CO2: 24 mmol/L (ref 22–32)
CREATININE: 4.84 mg/dL — AB (ref 0.61–1.24)
Chloride: 108 mmol/L (ref 101–111)
GFR, EST AFRICAN AMERICAN: 14 mL/min — AB (ref >60)
GFR, EST NON AFRICAN AMERICAN: 12 mL/min — AB (ref >60)
Glucose, Bld: 83 mg/dL (ref 65–99)
Potassium: 5.2 mmol/L — ABNORMAL HIGH (ref 3.5–5.1)
SODIUM: 138 mmol/L (ref 135–145)
Total Bilirubin: 0.3 mg/dL (ref 0.3–1.2)
Total Protein: 6.3 g/dL — ABNORMAL LOW (ref 6.5–8.1)

## 2016-07-05 LAB — CBC
HCT: 27.9 % — ABNORMAL LOW (ref 39.0–52.0)
Hemoglobin: 9 g/dL — ABNORMAL LOW (ref 13.0–17.0)
MCH: 28.5 pg (ref 26.0–34.0)
MCHC: 32.3 g/dL (ref 30.0–36.0)
MCV: 88.3 fL (ref 78.0–100.0)
PLATELETS: 206 10*3/uL (ref 150–400)
RBC: 3.16 MIL/uL — AB (ref 4.22–5.81)
RDW: 13.6 % (ref 11.5–15.5)
WBC: 6.1 10*3/uL (ref 4.0–10.5)

## 2016-07-05 LAB — ECHOCARDIOGRAM COMPLETE
Height: 73 in
Weight: 4765.46 oz

## 2016-07-05 LAB — GLUCOSE, CAPILLARY
GLUCOSE-CAPILLARY: 77 mg/dL (ref 65–99)
Glucose-Capillary: 85 mg/dL (ref 65–99)
Glucose-Capillary: 94 mg/dL (ref 65–99)
Glucose-Capillary: 117 mg/dL — ABNORMAL HIGH (ref 65–99)

## 2016-07-05 MED ORDER — INSULIN GLARGINE 100 UNIT/ML ~~LOC~~ SOLN
15.0000 [IU] | Freq: Every day | SUBCUTANEOUS | Status: DC
  Filled 2016-07-05: qty 0.15

## 2016-07-05 NOTE — Progress Notes (Signed)
[facility] PROGRESS NOTE  [name] SAY:[id number] DOB: 20-Jun-1960 DOA: 07/02/2016   PCP: Teressa Lower, MD     Assessment/Plan: Principal Problem:   Acute renal failure superimposed on chronic kidney disease ([place]) Active Problems:   Diabetes mellitus type 2 in obese (HCC)   Hyperkalemia   Hypertensive urgency   Hyperlipidemia   Anemia of chronic disease   ARF (acute renal failure) ([place])   Scott Castillo a 56 y.o.malewith medical history significant of HTN, DM type II, chronic kidney disease;who presents for worsening shortness of breath. Patient notes symptoms have been progressively worsening over the last 1 month. He states that his GFR was somewhere around 21 last summer.Pt complaining of some exertional dyspnea and bilateral leg swelling Initial lab work revealed hemoglobin 8.6, potassium 5.6, BUN 62, creatinine 4.92. Initial blood pressures wereelevated upto 223/103. Patientwas able to maintain O2 saturations on room air. While in the ED he was given 50 mg of hydralazine in the ED and 80 mg of Lasix IV. Nephrology was consulted for acute on chronic kidney disease management  Assessment and plan ARF (acute renal failure) on chronic kidney disease stage IV: Patient with progressive decline in renal function over the last year. Previously, noted to have been evaluated by Dr. Florene Glen of nephrology. Telemetry shows normal sinus rhythm Responding well to diuretics. 6725 cc out, cr stable for 3 days ,  Renal ultrasound within normal limits - Appreciate nephrology  follow-up   recommendations  Fluid overload with CHF exacerbation: Initial chest x-ray revealing cardiac enlargement with bilateral interstitial edema and small bilateral pleural effusions. Patient was given 80mg  mg of Lasix IV in the ED., continue Lasix and Coreg - Strict ins and outs and daily weights - Continue diuretics per nephrology - Check echocardiogram in a.m.    Hyperkalemia: Initial  potassium 5.6 on admission. 5.2 today. - Held valsartan - Repeat potassium level in a.m.  Diabetes mellitus type 2 CBG low normal - Held Januvia - CBGs every before meals and at bedtime with sensitive SSI Dose of Lantus   reduced   to 18 units>15 units today  Hemoglobin A1c 5.2  Hypertensive urgency: Systolic blood pressures on admission noted to be in the 200s. Blood pressure somewhat improved on IV Lasix, Coreg, by mouth hydralazine,  - Hydralazine IV prn - Valsartan on hold secondary to hyperkalemia  Hyperlipidemia - Continue pravastatin   Anemia of chronic disease: hemoglobin 8 on admission.Now 9 - Continue to monitor     DVT prophylaxsis heparin  Code Status:  Full code     Family Communication: Discussed in detail with the patient, all imaging results, lab results explained to the patient   Disposition Plan:  Continue diuresis per nephrology   Consultants:  Nephrology  Procedures:  None  Antibiotics: Anti-infectives    None         HPI/Subjective: debies cp, sob,feels swelling is getting better  Objective: Vitals:   07/04/16 0900 07/04/16 2047 07/05/16 0507 07/05/16 0900  BP: (!) 170/77 (!) 169/86 (!) 168/82 (!) 160/81  Pulse: 77 65 74 (!) 58  Resp: 18 18 18 18   Temp: 97.6 F (36.4 C) 97.4 F (36.3 C) 97.7 F (36.5 C) 98.7 F (37.1 C)  TempSrc: Oral Oral Oral Oral  SpO2: 99% 98% 97% 98%  Weight:  135.1 kg (297 lb 13.5 oz)    Height:        Intake/Output Summary (Last 24 hours) at 07/05/16 1008 Last data filed  at 07/05/16 0900  Gross per 24 hour  Intake              840 ml  Output             5750 ml  Net            -4910 ml    Exam:  Examination:  General exam: Appears calm and comfortable  Respiratory system: Clear to auscultation. Respiratory effort normal. Cardiovascular system: S1 & S2 heard, RRR. No JVD, murmurs, rubs, gallops or clicks. No pedal edema. Gastrointestinal system: Abdomen is nondistended, soft and  nontender. No organomegaly or masses felt. Normal bowel sounds heard. Central nervous system: Alert and oriented. No focal neurological deficits. Extremities: Symmetric 5 x 5 power. Skin: No rashes, lesions or ulcers Psychiatry: Judgement and insight appear normal. Mood & affect appropriate.     Data Reviewed: I have personally reviewed following labs and imaging studies  Micro Results No results found for this or any previous visit (from the past 240 hour(s)).  Radiology Reports Dg Chest 2 View  Result Date: 07/03/2016 CLINICAL DATA:  Shortness of breath. Exertional dyspnea, bilateral leg swelling. History of hypertension and diabetes. EXAM: CHEST  2 VIEW COMPARISON:  None. FINDINGS: Mild cardiac enlargement. Minimal if any pulmonary vascular congestion but there is diffuse interstitial pattern to the lungs and small bilateral pleural effusions suggesting fluid overload and edema. No focal consolidation or volume loss. No pneumothorax. Mild degenerative changes in the spine. Tortuous aorta. IMPRESSION: Cardiac enlargement. Bilateral interstitial edema and small bilateral pleural effusions. Electronically Signed   By: Lucienne Capers M.D.   On: 07/03/2016 00:31   US Renal  Result Date: 07/03/2016 CLINICAL DATA:  Acute renal failure 1 day. History of diabetes and hypertension. EXAM: RENAL / URINARY TRACT ULTRASOUND COMPLETE COMPARISON:  None. FINDINGS: Right Kidney: Length: 12.0 cm. Slight increased cortical echogenicity. No mass or hydronephrosis visualized. Left Kidney: Length: 13.2 cm. Slight increased cortical echogenicity. No mass or hydronephrosis visualized. Bladder: Appears normal for degree of bladder distention. Small bilateral pleural effusions. IMPRESSION: Normal size kidneys without hydronephrosis. Subtle increased cortical echogenicity which can be seen with medical renal disease. Small bilateral pleural effusions. Electronically Signed   By: Marin Olp M.D.   On: 07/03/2016  07:21     CBC  Recent Labs Lab 07/02/16 1914 07/05/16 0534  WBC 7.2 6.1  HGB 8.6* 9.0*  HCT 27.1* 27.9*  PLT 216 206  MCV 89.4 88.3  MCH 28.4 28.5  MCHC 31.7 32.3  RDW 14.1 13.6  LYMPHSABS 0.9  --   MONOABS 0.6  --   EOSABS 0.6  --   BASOSABS 0.0  --     Chemistries   Recent Labs Lab 07/02/16 1914 07/03/16 0520 07/04/16 0722 07/05/16 0534  NA 139 139 140 138  K 5.6* 5.4* 5.3* 5.2*  CL 110 111 113* 108  CO2 20* 21* 23 24  GLUCOSE 84 89 106* 83  BUN 62* 61* 62* 62*  CREATININE 4.92* 4.87* 4.86* 4.84*  CALCIUM 8.5* 8.6* 8.5* 8.5*  MG  --   --  1.7  --   AST 15  --   --  16  ALT 14*  --   --  15*  ALKPHOS 63  --   --  65  BILITOT 0.4  --   --  0.3   ------------------------------------------------------------------------------------------------------------------ estimated creatinine clearance is 25.2 mL/min (A) (by C-G formula based on SCr of 4.84 mg/dL (H)). ------------------------------------------------------------------------------------------------------------------  Recent  Labs  07/03/16 0520  HGBA1C 5.2   ------------------------------------------------------------------------------------------------------------------ No results for input(s): CHOL, HDL, LDLCALC, TRIG, CHOLHDL, LDLDIRECT in the last 72 hours. ------------------------------------------------------------------------------------------------------------------ No results for input(s): TSH, T4TOTAL, T3FREE, THYROIDAB in the last 72 hours.  Invalid input(s): FREET3 ------------------------------------------------------------------------------------------------------------------  Recent Labs  07/03/16 1432  FERRITIN 107  TIBC 249*  IRON 29*    Coagulation profile No results for input(s): INR, PROTIME in the last 168 hours.  No results for input(s): DDIMER in the last 72 hours.  Cardiac Enzymes No results for input(s): CKMB, TROPONINI, MYOGLOBIN in the last 168 hours.  Invalid  input(s): CK ------------------------------------------------------------------------------------------------------------------ Invalid input(s): POCBNP   CBG:  Recent Labs Lab 07/04/16 0749 07/04/16 1244 07/04/16 1650 07/04/16 2045 07/05/16 0826  GLUCAP 102* 101* 91 116* 77       Studies: No results found.    Lab Results  Component Value Date   HGBA1C 5.2 07/03/2016   Lab Results  Component Value Date   CREATININE 4.84 (H) 07/05/2016       Scheduled Meds: . carvedilol  25 mg Oral BID WC  . furosemide  80 mg Intravenous BID  . heparin  5,000 Units Subcutaneous Q8H  . hydrALAZINE  25 mg Oral Q8H  . insulin aspart  0-9 Units Subcutaneous TID WC  . insulin glargine  18 Units Subcutaneous Daily  . pravastatin  80 mg Oral q1800   Continuous Infusions:   LOS: 2 days    Time spent: >30 MINS    [facility]  [facility] Pager [phone]. If 7PM-7AM, please contact night-coverage at [email or web address], password [facility] 07/05/2016, 10:08 AM  LOS: 2 days

## 2016-07-05 NOTE — Progress Notes (Signed)
Patient ID: Scott Castillo, male   DOB: 11-Apr-1961, 56 y.o.   MRN: [id number] [facility] Progress Note   Assessment/ Plan:   1. Acute kidney injury on chronic kidney disease stage III versus progression of chronic kidney disease: Likely with hemodynamically mediated acute on chronic renal insufficiency with CHF exacerbation. Ongoing diuretic therapy at this time with excellent volume unloading overnight. Continue current dose of furosemide 80 mg IV twice a day for at least another 24 hours prior to conversion to oral diuretics. Reminded him on the importance of regular renal follow-up as well as compliance with low-sodium diet. I explained to him that he is just barely over the threshold for dialysis and even if he does not require this admit- if he does not improve it will likely be very soon in his future 2. Volume overload/CHF exacerbation:  ARB currently on hold with escalation of diuretic therapy-echocardiogram pending. Discussed low sodium diet. 3. Hyperkalemia: Mild, likely from AKI on CKD III versus distal RTA of diabetes. Anticipate will improve with ongoing diuresis. 4. Anemia: Anemia of chronic disease/chronic kidney disease, he denies any overt losses. With low iron saturations-ferritin 107 and saturation ratio of 12%- begin Fereheme 510 mg IV every 72 hours 2 doses. 5. Hypertension:  blood pressures remain elevated on carvedilol and current dose of furosemide. started him on hydralazine 25 mg 3 times a day while holding valsartan.  Subjective:   Reports to be feeling somewhat better with improvement of shortness of breath - Is and Os indicate negative 10 liters but weight unchanged   Objective:   BP (!) 160/81 (BP Location: Right Arm)   Pulse (!) 58   Temp 98.7 F (37.1 C) (Oral)   Resp 18   Ht 6\' 1"  (1.854 m)   Wt 135.1 kg (297 lb 13.5 oz)   SpO2 98%   BMI 39.30 kg/m   Intake/Output Summary (Last 24 hours) at 07/05/16 1155 Last data filed at 07/05/16 0900  Gross per 24 hour  Intake             1080 ml  Output             5250 ml  Net            -4170 ml   Weight change: 1.9 kg (4 lb 3 oz)  Physical Exam: [id number] resting in bed, watching television CVS: Pulse regular rhythm, normal rate, S1 and S2 with ejection systolic murmur Resp: Fine rales bibasally, no wheeze/rhonchi Abd: Soft, obese, nontender Ext: 3+ bilateral pitting edema with chronic venous stasis changes  Imaging: No results found.  Labs: BMET  Recent Labs Lab 07/02/16 1914 07/03/16 0520 07/04/16 0722 07/05/16 0534  NA 139 139 140 138  K 5.6* 5.4* 5.3* 5.2*  CL 110 111 113* 108  CO2 20* 21* 23 24  GLUCOSE 84 89 106* 83  BUN 62* 61* 62* 62*  CREATININE 4.92* 4.87* 4.86* 4.84*  CALCIUM 8.5* 8.6* 8.5* 8.5*  PHOS  --   --  4.5  --    CBC  Recent Labs Lab 07/02/16 1914 07/05/16 0534  WBC 7.2 6.1  NEUTROABS 5.0  --   HGB 8.6* 9.0*  HCT 27.1* 27.9*  MCV 89.4 88.3  PLT 216 206   Medications:    . carvedilol  25 mg Oral BID WC  . furosemide  80 mg Intravenous BID  . heparin  5,000 Units Subcutaneous Q8H  . hydrALAZINE  25 mg Oral Q8H  . insulin  aspart  0-9 Units Subcutaneous TID WC  . [START ON 07/06/2016] insulin glargine  15 Units Subcutaneous Daily  . pravastatin  80 mg Oral q1800   Zakhai Meisinger A  07/05/2016, 11:55 AM

## 2016-07-05 NOTE — Progress Notes (Signed)
  Echocardiogram 2D Echocardiogram has been performed.  Darlina Sicilian M 07/05/2016, 9:57 AM

## 2016-07-06 LAB — BASIC METABOLIC PANEL
Anion gap: 10 (ref 5–15)
BUN: 60 mg/dL — ABNORMAL HIGH (ref 6–20)
CO2: 24 mmol/L (ref 22–32)
Calcium: 8.4 mg/dL — ABNORMAL LOW (ref 8.9–10.3)
Chloride: 105 mmol/L (ref 101–111)
Creatinine, Ser: 4.82 mg/dL — ABNORMAL HIGH (ref 0.61–1.24)
GFR, EST AFRICAN AMERICAN: 14 mL/min — AB (ref >60)
GFR, EST NON AFRICAN AMERICAN: 12 mL/min — AB (ref >60)
Glucose, Bld: 86 mg/dL (ref 65–99)
Potassium: 4.7 mmol/L (ref 3.5–5.1)
SODIUM: 139 mmol/L (ref 135–145)

## 2016-07-06 LAB — GLUCOSE, CAPILLARY
Glucose-Capillary: 78 mg/dL (ref 65–99)
Glucose-Capillary: 95 mg/dL (ref 65–99)

## 2016-07-06 MED ORDER — INSULIN DEGLUDEC 200 UNIT/ML ~~LOC~~ SOPN: 10.0000 [IU] | PEN_INJECTOR | SUBCUTANEOUS | 11 refills | Status: DC

## 2016-07-06 MED ORDER — FUROSEMIDE 80 MG PO TABS: 160.0000 mg | ORAL_TABLET | Freq: Two times a day (BID) | ORAL | 1 refills | Status: DC

## 2016-07-06 MED ORDER — INSULIN GLARGINE 100 UNIT/ML ~~LOC~~ SOLN
10.0000 [IU] | Freq: Every day | SUBCUTANEOUS | Status: DC
  Administered 2016-07-06: 10 [IU] via SUBCUTANEOUS
  Filled 2016-07-06: qty 0.1

## 2016-07-06 MED ORDER — FUROSEMIDE 80 MG PO TABS: 80.0000 mg | ORAL_TABLET | Freq: Two times a day (BID) | ORAL | 1 refills | Status: DC

## 2016-07-06 MED ORDER — HYDRALAZINE HCL 25 MG PO TABS: 25.0000 mg | ORAL_TABLET | Freq: Three times a day (TID) | ORAL | 2 refills | Status: DC

## 2016-07-06 NOTE — Progress Notes (Signed)
Patient ID: Scott Castillo, male   DOB: December 05, 1960, 56 y.o.   MRN: [id number] [facility] Progress Note   Assessment/ Plan:   1. Acute kidney injury on chronic kidney disease stage III versus progression of chronic kidney disease: Likely with hemodynamically mediated acute on chronic renal insufficiency with CHF exacerbation. Ongoing diuretic therapy at this time with excellent volume unloading overnight. I see plans are for him to go home today- have recommended lasix 160 BID PO . Reminded him on the importance of regular renal follow-up as well as compliance with low-sodium diet. I explained to him that he is just barely over the threshold for dialysis and even if he does not require this admit- if he does not improve it will likely be very soon in his future- will make follow up arrangements at [facility] for next week 2. Volume overload/CHF exacerbation:  ARB currently on hold with escalation of diuretic therapy-echocardiogram normal Discussed low sodium diet. 3. Hyperkalemia: Mild, resolved 4. Anemia: Anemia of chronic disease/chronic kidney disease, he denies any overt losses. With low iron saturations-ferritin 107 and saturation ratio of 12%- begin Fereheme 510 mg IV every 72 hours 2 doses- not sure if got ??. Will continue to manage as OP 5. Hypertension:  blood pressures remain elevated on carvedilol 25 BID and current dose of furosemide. started him on hydralazine 25 mg 3 times a day to continue  Follow up at [facility] April 5 10:15 with Florene Glen - labs earlier in the week  Subjective:   Reports to be feeling somewhat better with improvement of shortness of breath - Is and Os indicate negative 13 liters but weight unchanged   Objective:   BP (!) 167/74 (BP Location: Right Arm)   Pulse 64   Temp 97.8 F (36.6 C) (Oral)   Resp 18   Ht 6\' 1"  (1.854 m)   Wt 136 kg (299 lb 13.2 oz)   SpO2 98%   BMI 39.56 kg/m   Intake/Output Summary (Last 24 hours) at 07/06/16 1144 Last data filed  at 07/06/16 1000  Gross per 24 hour  Intake              600 ml  Output             3450 ml  Net            -2850 ml   Weight change: 0.9 kg (1 lb 15.8 oz)  Physical Exam: [id number] resting in bed, watching television CVS: Pulse regular rhythm, normal rate, S1 and S2 with ejection systolic murmur Resp: Fine rales bibasally, no wheeze/rhonchi Abd: Soft, obese, nontender Ext: 3+ bilateral pitting edema with chronic venous stasis changes  Imaging: No results found.  Labs: BMET  Recent Labs Lab 07/02/16 1914 07/03/16 0520 07/04/16 0722 07/05/16 0534 07/06/16 0502  NA 139 139 140 138 139  K 5.6* 5.4* 5.3* 5.2* 4.7  CL 110 111 113* 108 105  CO2 20* 21* 23 24 24   GLUCOSE 84 89 106* 83 86  BUN 62* 61* 62* 62* 60*  CREATININE 4.92* 4.87* 4.86* 4.84* 4.82*  CALCIUM 8.5* 8.6* 8.5* 8.5* 8.4*  PHOS  --   --  4.5  --   --    CBC  Recent Labs Lab 07/02/16 1914 07/05/16 0534  WBC 7.2 6.1  NEUTROABS 5.0  --   HGB 8.6* 9.0*  HCT 27.1* 27.9*  MCV 89.4 88.3  PLT 216 206   Medications:    . carvedilol  25 mg  Oral BID WC  . furosemide  80 mg Intravenous BID  . heparin  5,000 Units Subcutaneous Q8H  . hydrALAZINE  25 mg Oral Q8H  . insulin aspart  0-9 Units Subcutaneous TID WC  . insulin glargine  10 Units Subcutaneous Daily  . pravastatin  80 mg Oral q1800   Mesa Janus A  07/06/2016, 11:44 AM

## 2016-07-06 NOTE — Discharge Summary (Signed)
Physician Discharge Summary  Scott Castillo MRN: [id number] DOB/AGE: Oct 26, 1960 56 y.o.  PCP: Dina Rich, MD   Admit date: 07/02/2016 Discharge date: 07/06/2016  Discharge Diagnoses:    Principal Problem:   Acute renal failure superimposed on chronic kidney disease (HCC) Active Problems:   Diabetes mellitus type 2 in obese (HCC)   Hyperkalemia   Hypertensive urgency   Hyperlipidemia   Anemia of chronic disease   ARF (acute renal failure) (HCC)    Follow-up recommendations Follow-up with PCP in 3-5 days , including all  additional recommended appointments as below Follow-up CBC, CMP in 3-5 days Recommend outpatient sleep study      Current Discharge Medication List    START taking these medications   Details  furosemide (LASIX) 80 MG tablet Take 2 tablets (160 mg total) by mouth 2 (two) times daily. Qty: 120 tablet, Refills: 1    hydrALAZINE (APRESOLINE) 25 MG tablet Take 1 tablet (25 mg total) by mouth every 8 (eight) hours. Qty: 90 tablet, Refills: 2      CONTINUE these medications which have CHANGED   Details  Insulin Degludec (TRESIBA FLEXTOUCH) 200 UNIT/ML SOPN Inject 10 Units into the skin every morning. Qty: 1 pen, Refills: 11      CONTINUE these medications which have NOT CHANGED   Details  carvedilol (COREG) 25 MG tablet Take 25 mg by mouth 2 (two) times daily with a meal.     Cholecalciferol (VITAMIN D3) 1000 units CAPS Take 1,000 Units by mouth every morning.     CINNAMON PO Take 1 tablet by mouth 2 (two) times daily.     co-enzyme Q-10 50 MG capsule Take 100 mg by mouth daily.    DANDELION PO Take 1 tablet by mouth daily.     Horse Chestnut 300 MG CAPS Take 1 capsule by mouth every morning.     Misc Natural Products (ECHINACEA COMPOUND PO) Take 1 tablet by mouth 2 (two) times daily.     Misc Natural Products (OSTEO BI-FLEX TRIPLE STRENGTH) TABS Take 1 tablet by mouth 2 (two) times daily.     Multiple Vitamin (MULTIVITAMIN) tablet Take 1  tablet by mouth daily.    pravastatin (PRAVACHOL) 80 MG tablet Take 80 mg by mouth.    Probiotic Product ([employer] ADV PROBIOTIC GUMMIES PO) Take 2 tablets by mouth every morning. gummy    TURMERIC PO Take 2 tablets by mouth every morning.     vitamin B-12 (CYANOCOBALAMIN) 500 MCG tablet Take 500 mcg by mouth daily.    vitamin C (ASCORBIC ACID) 500 MG tablet Take 500 mg by mouth daily.    vitamin E 1000 UNIT capsule Take 1,000 Units by mouth daily.      STOP taking these medications     sitaGLIPtin (JANUVIA) 50 MG tablet      valsartan (DIOVAN) 160 MG tablet          Discharge Condition: Stable   Discharge Instructions Get Medicines reviewed and adjusted: Please take all your medications with you for your next visit with your Primary MD  Please request your Primary MD to go over all hospital tests and procedure/radiological results at the follow up, please ask your Primary MD to get all Hospital records sent to his/her office.  If you experience worsening of your admission symptoms, develop shortness of breath, life threatening emergency, suicidal or homicidal thoughts you must seek medical attention immediately by calling 911 or calling your MD immediately if symptoms less severe.  You must read  complete instructions/literature along with all the possible adverse reactions/side effects for all the Medicines you take and that have been prescribed to you. Take any new Medicines after you have completely understood and accpet all the possible adverse reactions/side effects.   Do not drive when taking Pain medications.   Do not take more than prescribed Pain, Sleep and Anxiety Medications  Special Instructions: If you have smoked or chewed Tobacco in the last 2 yrs please stop smoking, stop any regular Alcohol and or any Recreational drug use.  Wear Seat belts while driving.  Please note  You were cared for by a hospitalist during your hospital stay. Once you are  discharged, your primary care physician will handle any further medical issues. Please note that NO REFILLS for any discharge medications will be authorized once you are discharged, as it is imperative that you return to your primary care physician (or establish a relationship with a primary care physician if you do not have one) for your aftercare needs so that they can reassess your need for medications and monitor your lab values.     Allergies  Allergen Reactions  . Pioglitazone Swelling      Disposition: Home   Consults: * Nephrology    Significant Diagnostic Studies:  Dg Chest 2 View  Result Date: 07/03/2016 CLINICAL DATA:  Shortness of breath. Exertional dyspnea, bilateral leg swelling. History of hypertension and diabetes. EXAM: CHEST  2 VIEW COMPARISON:  None. FINDINGS: Mild cardiac enlargement. Minimal if any pulmonary vascular congestion but there is diffuse interstitial pattern to the lungs and small bilateral pleural effusions suggesting fluid overload and edema. No focal consolidation or volume loss. No pneumothorax. Mild degenerative changes in the spine. Tortuous aorta. IMPRESSION: Cardiac enlargement. Bilateral interstitial edema and small bilateral pleural effusions. Electronically Signed   By: Lucienne Capers M.D.   On: 07/03/2016 00:31   US Renal  Result Date: 07/03/2016 CLINICAL DATA:  Acute renal failure 1 day. History of diabetes and hypertension. EXAM: RENAL / URINARY TRACT ULTRASOUND COMPLETE COMPARISON:  None. FINDINGS: Right Kidney: Length: 12.0 cm. Slight increased cortical echogenicity. No mass or hydronephrosis visualized. Left Kidney: Length: 13.2 cm. Slight increased cortical echogenicity. No mass or hydronephrosis visualized. Bladder: Appears normal for degree of bladder distention. Small bilateral pleural effusions. IMPRESSION: Normal size kidneys without hydronephrosis. Subtle increased cortical echogenicity which can be seen with medical renal disease.  Small bilateral pleural effusions. Electronically Signed   By: Marin Olp M.D.   On: 07/03/2016 07:21    2-D echo Study Conclusions  - Left ventricle: The cavity size was moderately dilated. Systolic   function was normal. The estimated ejection fraction was in the   range of 55% to 60%. Wall motion was normal; there were no   regional wall motion abnormalities. Left ventricular diastolic   function parameters were normal. - Aortic valve: Transvalvular velocity was within the normal range.   There was no stenosis. There was no regurgitation. - Mitral valve: Transvalvular velocity was within the normal range.   There was no evidence for stenosis. There was mild regurgitation. - Left atrium: The atrium was severely dilated. - Right ventricle: The cavity size was normal. Wall thickness was   normal. Systolic function was normal. - Atrial septum: No defect or patent foramen ovale was identified   by color flow Doppler. - Tricuspid valve: There was trivial regurgitation. - Pulmonary arteries: Systolic pressure was mildly increased. PA   peak pressure: 44 mm Hg (S). - Pericardium, extracardiac:  A trivial pericardial effusion was   identified.   Filed Weights   07/03/16 2225 07/04/16 2047 07/05/16 2206  Weight: 133.2 kg (293 lb 10.4 oz) 135.1 kg (297 lb 13.5 oz) 136 kg (299 lb 13.2 oz)      Labs: Results for orders placed or performed during the hospital encounter of 07/02/16 (from the past 48 hour(s))  Glucose, capillary     Status: Abnormal   Collection Time: 07/04/16 12:44 PM  Result Value Ref Range   Glucose-Capillary 101 (H) 65 - 99 mg/dL  Glucose, capillary     Status: None   Collection Time: 07/04/16  4:50 PM  Result Value Ref Range   Glucose-Capillary 91 65 - 99 mg/dL   Comment 1 Notify RN    Comment 2 Document in Chart   Glucose, capillary     Status: Abnormal   Collection Time: 07/04/16  8:45 PM  Result Value Ref Range   Glucose-Capillary 116 (H) 65 - 99 mg/dL   Comprehensive metabolic panel     Status: Abnormal   Collection Time: 07/05/16  5:34 AM  Result Value Ref Range   Sodium 138 135 - 145 mmol/L   Potassium 5.2 (H) 3.5 - 5.1 mmol/L   Chloride 108 101 - 111 mmol/L   CO2 24 22 - 32 mmol/L   Glucose, Bld 83 65 - 99 mg/dL   BUN 62 (H) 6 - 20 mg/dL   Creatinine, Ser 3.64 (H) 0.61 - 1.24 mg/dL   Calcium 8.5 (L) 8.9 - 10.3 mg/dL   Total Protein 6.3 (L) 6.5 - 8.1 g/dL   Albumin 2.8 (L) 3.5 - 5.0 g/dL   AST 16 15 - 41 U/L   ALT 15 (L) 17 - 63 U/L   Alkaline Phosphatase 65 38 - 126 U/L   Total Bilirubin 0.3 0.3 - 1.2 mg/dL   GFR calc non Af Amer 12 (L) >60 mL/min   GFR calc Af Amer 14 (L) >60 mL/min    Comment: (NOTE) The eGFR has been calculated using the CKD EPI equation. This calculation has not been validated in all clinical situations. eGFR's persistently <60 mL/min signify possible Chronic Kidney Disease.    Anion gap 6 5 - 15  CBC     Status: Abnormal   Collection Time: 07/05/16  5:34 AM  Result Value Ref Range   WBC 6.1 4.0 - 10.5 K/uL   RBC 3.16 (L) 4.22 - 5.81 MIL/uL   Hemoglobin 9.0 (L) 13.0 - 17.0 g/dL   HCT 94.0 (L) 27.0 - 04.3 %   MCV 88.3 78.0 - 100.0 fL   MCH 28.5 26.0 - 34.0 pg   MCHC 32.3 30.0 - 36.0 g/dL   RDW 29.7 10.2 - 46.8 %   Platelets 206 150 - 400 K/uL  Glucose, capillary     Status: None   Collection Time: 07/05/16  8:26 AM  Result Value Ref Range   Glucose-Capillary 77 65 - 99 mg/dL   Comment 1 Notify RN   Glucose, capillary     Status: None   Collection Time: 07/05/16 12:29 PM  Result Value Ref Range   Glucose-Capillary 85 65 - 99 mg/dL   Comment 1 Notify RN   Glucose, capillary     Status: None   Collection Time: 07/05/16  5:10 PM  Result Value Ref Range   Glucose-Capillary 94 65 - 99 mg/dL   Comment 1 Notify RN   Glucose, capillary     Status: Abnormal   Collection Time:  07/05/16  9:47 PM  Result Value Ref Range   Glucose-Capillary 117 (H) 65 - 99 mg/dL  Basic metabolic panel     Status:  Abnormal   Collection Time: 07/06/16  5:02 AM  Result Value Ref Range   Sodium 139 135 - 145 mmol/L   Potassium 4.7 3.5 - 5.1 mmol/L   Chloride 105 101 - 111 mmol/L   CO2 24 22 - 32 mmol/L   Glucose, Bld 86 65 - 99 mg/dL   BUN 60 (H) 6 - 20 mg/dL   Creatinine, Ser 4.82 (H) 0.61 - 1.24 mg/dL   Calcium 8.4 (L) 8.9 - 10.3 mg/dL   GFR calc non Af Amer 12 (L) >60 mL/min   GFR calc Af Amer 14 (L) >60 mL/min    Comment: (NOTE) The eGFR has been calculated using the CKD EPI equation. This calculation has not been validated in all clinical situations. eGFR's persistently <60 mL/min signify possible Chronic Kidney Disease.    Anion gap 10 5 - 15  Glucose, capillary     Status: None   Collection Time: 07/06/16  7:47 AM  Result Value Ref Range   Glucose-Capillary 78 65 - 99 mg/dL     Lipid Panel  No results found for: CHOL, TRIG, HDL, CHOLHDL, VLDL, LDLCALC, LDLDIRECT      HPI :  Scott Castillo a 56 y.o.malewith medical history significant of HTN, DM type II, chronic kidney disease;who presents for worsening shortness of breath. Patient notes symptoms have been progressively worsening over the last 1 month. He states that his GFR was somewhere around 21 last summer.Pt complaining of some exertional dyspnea and bilateral leg swelling Initial lab work revealed hemoglobin 8.6, potassium 5.6, BUN 62, creatinine 4.92. Initial blood pressures wereelevated upto 223/103. Patientwas able to maintain O2 saturations on room air. While in the ED he was given 50 mg of hydralazine in the ED and 80 mg of Lasix IV. Nephrology was consulted for acute on chronic kidney disease management   HOSPITAL COURSE: *  ARF (acute renal failure) on chronic kidney disease stage [id number] with progressive decline in renal function over the last year.Likely with hemodynamically mediated acute on chronic renal insufficiency with CHF exacerbation. Previously,noted to have been evaluated by Dr. Florene Glen of  nephrology. Patient admitted and diuresed with high-dose IV Lasix managed by nephrology Patient had excellent response to diuretics, with 4-6 L of urine output on a daily basis. Renal ultrasound within normal limits  Scott Parish, MD , recommended Lasix 160 mg twice a day on the day of discharge, with close renal follow-up   Acute on chronic diastolic CHF exacerbation:Initial chest x-ray revealing cardiac enlargement with bilateral interstitial edema and small bilateral pleural effusions. Patient was given '80mg'$ mg of Lasix IV twice a day postadmission, continue Lasix and Coreg 2-D echo with results as above Nephrology currently recommends to hold ARB  Hyperkalemia: Initial potassium 5.6 on admission. 4.7  today. Continue to hold ARB    Diabetes mellitus type 2 CBG low normal - Held [place], Switched to Lantus , patient needed only 10 units in the hospital. Therefore have reduced dose of Treiseba   Hemoglobin A1c 5.2  Hypertensive urgency:Initial Systolic blood pressures on admission noted to be in the200s. Blood pressure somewhat improved on IV Lasix, Coreg, by mouth hydralazine,  - Valsartan on hold secondary to hyperkalemia  Hyperlipidemia - Continue pravastatin   Anemia of chronic disease: hemoglobin 8 on admission.Now 9 - Continue to monitor  Discharge Exam:   Blood pressure (!) 167/74, pulse 64, temperature 97.8 F (36.6 C), temperature source Oral, resp. rate 18, height '6\' 1"'$  (1.854 m), weight 136 kg (299 lb 13.2 oz), SpO2 98 %.  IPP:[id number] resting in bed, watching television CVS: Pulse regular rhythm, normal rate, S1 and S2 with ejection systolic murmur Resp: Fine rales bibasally, no wheeze/rhonchi Abd: Soft, obese, nontender Ext: 3+ bilateral pitting edema with chronic venous stasis changes    Follow-up Information    Call [place], MD.   Specialty:  Family Medicine Why:  To set up appointment with PCP, follow-up with PCP in  3-5 days, check CBC, BMP on 07/08/2016 at 3:30p Contact information: [address] [place]  [address] [phone]        Call Ulla Potash., MD.   Specialty:  Nephrology Why:  Please call office once discharged to set up follow-up appointment, follow-up in one to 2 weeks. Thank you.  Contact information: [place] [address] [phone]           Signed: Reyne Dumas 07/06/2016, 9:24 AM        Time spent >45 mins

## 2016-07-06 NOTE — Progress Notes (Signed)
Patient discharge teaching given, including activity, diet, follow-up appoints, and medications. Patient verbalized understanding of all discharge instructions. IV access was d/c'd. Vitals are stable. Skin is intact except as charted in most recent assessments. Pt to be escorted out by NT, to be driven home by family.  Raul Winterhalter, MBA, BSN, RN 

## 2016-07-06 NOTE — Progress Notes (Signed)
SATURATION QUALIFICATIONS: (This note is used to comply with regulatory documentation for home oxygen)  Patient Saturations on Room Air at Rest = 98%  Patient Saturations on Room Air while Ambulating = 96%  Please briefly explain why patient needs home oxygen: N/A   Jillyn Ledger, MBA, BSN, RN

## 2016-07-30 ENCOUNTER — Other Ambulatory Visit: Payer: Self-pay | Admitting: Vascular Surgery

## 2016-07-30 DIAGNOSIS — N185 Chronic kidney disease, stage 5: Secondary | ICD-10-CM

## 2016-07-30 DIAGNOSIS — Z01810 Encounter for preprocedural cardiovascular examination: Secondary | ICD-10-CM

## 2016-09-03 ENCOUNTER — Encounter: Payer: Self-pay | Admitting: Vascular Surgery

## 2016-09-14 ENCOUNTER — Ambulatory Visit (INDEPENDENT_AMBULATORY_CARE_PROVIDER_SITE_OTHER)
Admission: RE | Admit: 2016-09-14 | Discharge: 2016-09-14 | Disposition: A | Discharge status: Home / Self Care | Payer: BLUE CROSS/BLUE SHIELD | Source: Ambulatory Visit | Attending: Vascular Surgery | Admitting: Vascular Surgery

## 2016-09-14 ENCOUNTER — Ambulatory Visit (INDEPENDENT_AMBULATORY_CARE_PROVIDER_SITE_OTHER): Payer: BLUE CROSS/BLUE SHIELD | Admitting: Vascular Surgery

## 2016-09-14 ENCOUNTER — Ambulatory Visit (HOSPITAL_COMMUNITY)
Admission: RE | Admit: 2016-09-14 | Discharge: 2016-09-14 | Disposition: A | Discharge status: Home / Self Care | Payer: BLUE CROSS/BLUE SHIELD | Source: Ambulatory Visit | Attending: Vascular Surgery | Admitting: Vascular Surgery

## 2016-09-14 VITALS — BP 194/92 | HR 63 | Temp 97.6°F | Resp 16 | Ht 73.0 in | Wt 272.0 lb

## 2016-09-14 DIAGNOSIS — N185 Chronic kidney disease, stage 5: Secondary | ICD-10-CM | POA: Insufficient documentation

## 2016-09-14 DIAGNOSIS — Z01810 Encounter for preprocedural cardiovascular examination: Secondary | ICD-10-CM | POA: Insufficient documentation

## 2016-09-14 NOTE — Progress Notes (Signed)
[facility]  Patient name: CARLSON BELLAND MRN: [id number] DOB: 06-May-1960 Sex: male  REASON FOR VISIT: Discuss access for hemodialysis  HPI: Cardale Dorer Cragun is a 56 y.o. male well-known to me from prior bilateral staged laser ablation of great saphenous vein for venous hypertension. He has extensive swelling and had had the ulcerations. These had healed. He does have a new superficial excoriation over his right ankle where he bumped this at work. Says very superficial appears to be healing. He now has renal insufficiency is approaching need for hemodialysis. He is seen today for discussion of this. He is right-handed.  Past Medical History:  Diagnosis Date  . Venous stasis dermatitis of both lower extremities     No family history on file.  SOCIAL HISTORY: Social History  Substance Use Topics  . Smoking status: Never Smoker  . Smokeless tobacco: Never Used  . Alcohol use No    Allergies  Allergen Reactions  . Pioglitazone Swelling    Current Outpatient Prescriptions  Medication Sig Dispense Refill  . carvedilol (COREG) 25 MG tablet Take 25 mg by mouth 2 (two) times daily with a meal.     . Cholecalciferol (VITAMIN D3) 1000 units CAPS Take 1,000 Units by mouth every morning.     Marland Kitchen CINNAMON PO Take 1 tablet by mouth 2 (two) times daily.     Marland Kitchen co-enzyme Q-10 50 MG capsule Take 100 mg by mouth daily.    Marland Kitchen DANDELION PO Take 1 tablet by mouth daily.     . Horse Chestnut 300 MG CAPS Take 1 capsule by mouth every morning.     . Insulin Degludec (TRESIBA FLEXTOUCH) 200 UNIT/ML SOPN Inject 10 Units into the skin every morning. 1 pen 11  . Misc Natural Products (ECHINACEA COMPOUND PO) Take 1 tablet by mouth 2 (two) times daily.     . Misc Natural Products (OSTEO BI-FLEX TRIPLE STRENGTH) TABS Take 1 tablet by mouth 2 (two) times daily.     . Multiple Vitamin (MULTIVITAMIN) tablet Take 1 tablet by mouth daily.    .  pravastatin (PRAVACHOL) 80 MG tablet Take 80 mg by mouth.    . Probiotic Product ([employer]S ADV PROBIOTIC GUMMIES PO) Take 2 tablets by mouth every morning. gummy    . TURMERIC PO Take 2 tablets by mouth every morning.     . vitamin B-12 (CYANOCOBALAMIN) 500 MCG tablet Take 500 mcg by mouth daily.    . vitamin C (ASCORBIC ACID) 500 MG tablet Take 500 mg by mouth daily.    . vitamin E 1000 UNIT capsule Take 1,000 Units by mouth daily.    . furosemide (LASIX) 80 MG tablet Take 2 tablets (160 mg total) by mouth 2 (two) times daily. 120 tablet 1  . hydrALAZINE (APRESOLINE) 25 MG tablet Take 1 tablet (25 mg total) by mouth every 8 (eight) hours. 90 tablet 2   No current facility-administered medications for this visit.     REVIEW OF SYSTEMS:  [X]  denotes positive finding, [ ]  denotes negative finding Cardiac  Comments:  Chest pain or chest pressure:    Shortness of breath upon exertion:    Short of breath when lying flat:    Irregular heart rhythm:        Vascular    Pain in calf, thigh, or hip brought on by ambulation:    Pain in feet at night that wakes you up from your sleep:     Blood  clot in your veins:    Leg swelling:  x         PHYSICAL EXAM: Vitals:   09/14/16 0910 09/14/16 0911  BP: (!) 190/88 (!) 194/92  Pulse: 63 63  Resp: 16   Temp: 97.6 F (36.4 C)   SpO2: 98%   Weight: 272 lb (123.4 kg)   Height: 6\' 1"  (1.854 m)     GENERAL: The patient is a well-nourished male, in no acute distress. The vital signs are documented above. CARDIOVASCULAR: 2+ radial pulses bilaterally. Easily visible cephalic vein throughout his forearm bilaterally PULMONARY: There is good air exchange  MUSCULOSKELETAL: There are no Broadus deformities or cyanosis. NEUROLOGIC: No focal weakness or paresthesias are detected. SKIN: There are no ulcers or rashes noted. PSYCHIATRIC: The patient has a normal affect.  DATA:  Noninvasive studies reveal normal arterial flow to both upper extremities. Also a  nicely developed cephalic vein throughout their course on right and left arm.  MEDICAL ISSUES: Discuss options for hemodialysis access. This includes a tunneled catheter, AV fistula and AV graft. Have recommended a left AV graft. We will proceed with this on 09/10/2016. He is unclear as to whether or not he needs tunneled catheter as well. We will confirm this with Dr. Florene Glen prior to proceeding.    [name], MD FACS [facility] Tel [phone] Pager [phone]

## 2016-09-14 NOTE — Progress Notes (Signed)
Vitals:   09/14/16 0910  BP: (!) 190/88  Pulse: 63  Resp: 16  Temp: 97.6 F (36.4 C)  SpO2: 98%  Weight: 272 lb (123.4 kg)  Height: 6\' 1"  (1.854 m)

## 2016-09-15 ENCOUNTER — Other Ambulatory Visit: Payer: Self-pay

## 2016-09-16 ENCOUNTER — Other Ambulatory Visit: Payer: Self-pay

## 2016-09-16 ENCOUNTER — Telehealth: Payer: Self-pay

## 2016-09-16 NOTE — Telephone Encounter (Signed)
Rec'd message from [facility] @ Dr. Abel Presto office.  Reported that Dr. Florene Glen does not want the pt. to have a hemodialysis catheter at this time; requested only to place an arteriovenous fistula.  Notified the pt. of the above.  The pt. questioned this decision; advised that he should call and discuss with Dr. Florene Glen.  Pt. agreed.

## 2016-09-16 NOTE — Progress Notes (Signed)
Several unsuccessful attempts were made to contact pt; lvm on both home and cell phone with pre-op instructions according to pre-op checklist. Please complete assessments on DOS. Pt made aware to stop taking vitamins, fish oil, dandelion, and herbal medications. Do not take any NSAIDs ie: Ibuprofen, Advil, Naproxen, BC and Goody Powder. Pt made aware to check BG every 2 hours prior to arrival to hospital on DOS, Take 5 units of Tresiba insulin if BG is > 70 ( unless MD advised otherwise), treat a BG < 70 with 4 glucose tabs, or glucose Gel, or 4 ounces of cranberry or apple juice, wait 15 minutes after intervention to recheck BG, if BG remains< 70 call SS unit to speak with a nurse.

## 2016-09-17 ENCOUNTER — Encounter (HOSPITAL_COMMUNITY)
Admission: RE | Disposition: A | Discharge status: Home / Self Care | Payer: Self-pay | Source: Ambulatory Visit | Attending: Vascular Surgery

## 2016-09-17 ENCOUNTER — Encounter (HOSPITAL_COMMUNITY): Payer: Self-pay | Admitting: *Deleted

## 2016-09-17 ENCOUNTER — Ambulatory Visit (HOSPITAL_COMMUNITY)
Admission: RE | Admit: 2016-09-17 | Discharge: 2016-09-17 | Disposition: A | Discharge status: Home / Self Care | Payer: BLUE CROSS/BLUE SHIELD | Source: Ambulatory Visit | Attending: Vascular Surgery | Admitting: Vascular Surgery

## 2016-09-17 ENCOUNTER — Ambulatory Visit (HOSPITAL_COMMUNITY): Payer: BLUE CROSS/BLUE SHIELD | Admitting: Anesthesiology

## 2016-09-17 DIAGNOSIS — I340 Nonrheumatic mitral (valve) insufficiency: Secondary | ICD-10-CM | POA: Diagnosis not present

## 2016-09-17 DIAGNOSIS — I120 Hypertensive chronic kidney disease with stage 5 chronic kidney disease or end stage renal disease: Secondary | ICD-10-CM | POA: Diagnosis present

## 2016-09-17 DIAGNOSIS — Z6835 Body mass index (BMI) 35.0-35.9, adult: Secondary | ICD-10-CM | POA: Diagnosis not present

## 2016-09-17 DIAGNOSIS — E785 Hyperlipidemia, unspecified: Secondary | ICD-10-CM | POA: Diagnosis not present

## 2016-09-17 DIAGNOSIS — Z79899 Other long term (current) drug therapy: Secondary | ICD-10-CM | POA: Insufficient documentation

## 2016-09-17 DIAGNOSIS — E1122 Type 2 diabetes mellitus with diabetic chronic kidney disease: Secondary | ICD-10-CM | POA: Insufficient documentation

## 2016-09-17 DIAGNOSIS — E669 Obesity, unspecified: Secondary | ICD-10-CM | POA: Diagnosis not present

## 2016-09-17 DIAGNOSIS — Z888 Allergy status to other drugs, medicaments and biological substances status: Secondary | ICD-10-CM | POA: Diagnosis not present

## 2016-09-17 DIAGNOSIS — I708 Atherosclerosis of other arteries: Secondary | ICD-10-CM | POA: Insufficient documentation

## 2016-09-17 DIAGNOSIS — Z794 Long term (current) use of insulin: Secondary | ICD-10-CM | POA: Diagnosis not present

## 2016-09-17 DIAGNOSIS — N185 Chronic kidney disease, stage 5: Secondary | ICD-10-CM | POA: Insufficient documentation

## 2016-09-17 HISTORY — PX: AV FISTULA PLACEMENT: SHX1204

## 2016-09-17 LAB — POCT I-STAT 4, (NA,K, GLUC, HGB,HCT)
GLUCOSE: 129 mg/dL — AB (ref 65–99)
HEMATOCRIT: 23 % — AB (ref 39.0–52.0)
HEMOGLOBIN: 7.8 g/dL — AB (ref 13.0–17.0)
Potassium: 5.6 mmol/L — ABNORMAL HIGH (ref 3.5–5.1)
Sodium: 142 mmol/L (ref 135–145)

## 2016-09-17 LAB — GLUCOSE, CAPILLARY
GLUCOSE-CAPILLARY: 104 mg/dL — AB (ref 65–99)
Glucose-Capillary: 132 mg/dL — ABNORMAL HIGH (ref 65–99)

## 2016-09-17 SURGERY — ARTERIOVENOUS (AV) FISTULA CREATION
Anesthesia: Monitor Anesthesia Care | Site: Arm Lower | Laterality: Left

## 2016-09-17 MED ORDER — DEXTROSE 5 % IV SOLN
INTRAVENOUS | Status: DC | PRN
  Administered 2016-09-17: 1.5 g via INTRAVENOUS

## 2016-09-17 MED ORDER — ONDANSETRON HCL 4 MG/2ML IJ SOLN: 4.0000 mg | Freq: Once | INTRAMUSCULAR | Status: DC | PRN

## 2016-09-17 MED ORDER — ONDANSETRON HCL 4 MG/2ML IJ SOLN
INTRAMUSCULAR | Status: DC | PRN
  Administered 2016-09-17: 4 mg via INTRAVENOUS

## 2016-09-17 MED ORDER — LIDOCAINE HCL (CARDIAC) 20 MG/ML IV SOLN
INTRAVENOUS | Status: DC | PRN
  Administered 2016-09-17: 50 mg via INTRATRACHEAL

## 2016-09-17 MED ORDER — ONDANSETRON HCL 4 MG/2ML IJ SOLN
INTRAMUSCULAR | Status: AC
  Filled 2016-09-17: qty 2

## 2016-09-17 MED ORDER — FENTANYL CITRATE (PF) 250 MCG/5ML IJ SOLN
INTRAMUSCULAR | Status: AC
  Filled 2016-09-17: qty 5

## 2016-09-17 MED ORDER — OXYCODONE-ACETAMINOPHEN 5-325 MG PO TABS: 1.0000 | ORAL_TABLET | Freq: Four times a day (QID) | ORAL | 0 refills | Status: DC | PRN

## 2016-09-17 MED ORDER — PROPOFOL 10 MG/ML IV BOLUS
INTRAVENOUS | Status: AC
  Filled 2016-09-17: qty 20

## 2016-09-17 MED ORDER — SODIUM CHLORIDE 0.9 % IV SOLN
INTRAVENOUS | Status: DC | PRN
  Administered 2016-09-17: 500 mL

## 2016-09-17 MED ORDER — IOPAMIDOL (ISOVUE-300) INJECTION 61%
INTRAVENOUS | Status: AC
  Filled 2016-09-17: qty 50

## 2016-09-17 MED ORDER — FENTANYL CITRATE (PF) 100 MCG/2ML IJ SOLN
INTRAMUSCULAR | Status: DC | PRN
  Administered 2016-09-17: 50 ug via INTRAVENOUS

## 2016-09-17 MED ORDER — SODIUM CHLORIDE 0.9 % IV SOLN
INTRAVENOUS | Status: DC
  Administered 2016-09-17 (×3): via INTRAVENOUS

## 2016-09-17 MED ORDER — DEXTROSE 5 % IV SOLN
INTRAVENOUS | Status: AC
  Filled 2016-09-17: qty 1.5

## 2016-09-17 MED ORDER — PROPOFOL 10 MG/ML IV BOLUS
INTRAVENOUS | Status: DC | PRN
  Administered 2016-09-17: 20 mg via INTRAVENOUS

## 2016-09-17 MED ORDER — 0.9 % SODIUM CHLORIDE (POUR BTL) OPTIME
TOPICAL | Status: DC | PRN
  Administered 2016-09-17: 1000 mL

## 2016-09-17 MED ORDER — LIDOCAINE-EPINEPHRINE (PF) 1 %-1:200000 IJ SOLN
INTRAMUSCULAR | Status: AC
  Filled 2016-09-17: qty 30

## 2016-09-17 MED ORDER — PROPOFOL 500 MG/50ML IV EMUL
INTRAVENOUS | Status: DC | PRN
  Administered 2016-09-17: 75 ug/kg/min via INTRAVENOUS

## 2016-09-17 MED ORDER — HEPARIN SODIUM (PORCINE) 1000 UNIT/ML IJ SOLN
INTRAMUSCULAR | Status: AC
  Filled 2016-09-17: qty 1

## 2016-09-17 MED ORDER — PHENYLEPHRINE 40 MCG/ML (10ML) SYRINGE FOR IV PUSH (FOR BLOOD PRESSURE SUPPORT)
PREFILLED_SYRINGE | INTRAVENOUS | Status: AC
  Filled 2016-09-17: qty 10

## 2016-09-17 MED ORDER — FENTANYL CITRATE (PF) 100 MCG/2ML IJ SOLN: 25.0000 ug | INTRAMUSCULAR | Status: DC | PRN

## 2016-09-17 MED ORDER — LIDOCAINE-EPINEPHRINE (PF) 1 %-1:200000 IJ SOLN
INTRAMUSCULAR | Status: DC | PRN
  Administered 2016-09-17: 1 mL

## 2016-09-17 SURGICAL SUPPLY — 32 items
ADH SKN CLS APL DERMABOND .7 (GAUZE/BANDAGES/DRESSINGS) ×1
ARMBAND PINK RESTRICT EXTREMIT (MISCELLANEOUS) ×4 IMPLANT
CANISTER SUCT 3000ML PPV (MISCELLANEOUS) ×3 IMPLANT
CANNULA VESSEL 3MM 2 BLNT TIP (CANNULA) ×3 IMPLANT
CLIP LIGATING EXTRA MED SLVR (CLIP) ×3 IMPLANT
CLIP LIGATING EXTRA SM BLUE (MISCELLANEOUS) ×3 IMPLANT
COVER PROBE W GEL 5X96 (DRAPES) ×3 IMPLANT
DECANTER SPIKE VIAL GLASS SM (MISCELLANEOUS) ×3 IMPLANT
DERMABOND ADVANCED (GAUZE/BANDAGES/DRESSINGS) ×2
DERMABOND ADVANCED .7 DNX12 (GAUZE/BANDAGES/DRESSINGS) ×1 IMPLANT
ELECT REM PT RETURN 9FT ADLT (ELECTROSURGICAL) ×3
ELECTRODE REM PT RTRN 9FT ADLT (ELECTROSURGICAL) ×1 IMPLANT
GLOVE BIOGEL PI IND STRL 6.5 (GLOVE) IMPLANT
GLOVE BIOGEL PI INDICATOR 6.5 (GLOVE) ×2
GLOVE SS BIOGEL STRL SZ 7.5 (GLOVE) ×1 IMPLANT
GLOVE SUPERSENSE BIOGEL SZ 7.5 (GLOVE) ×2
GOWN STRL NON-REIN LRG LVL3 (GOWN DISPOSABLE) ×2 IMPLANT
GOWN STRL REUS W/ TWL LRG LVL3 (GOWN DISPOSABLE) ×3 IMPLANT
GOWN STRL REUS W/TWL LRG LVL3 (GOWN DISPOSABLE) ×15
KIT BASIN OR (CUSTOM PROCEDURE TRAY) ×3 IMPLANT
KIT ROOM TURNOVER OR (KITS) ×3 IMPLANT
NDL HYPO 25GX1X1/2 BEV (NEEDLE) IMPLANT
NEEDLE HYPO 25GX1X1/2 BEV (NEEDLE) ×3 IMPLANT
NS IRRIG 1000ML POUR BTL (IV SOLUTION) ×3 IMPLANT
PACK CV ACCESS (CUSTOM PROCEDURE TRAY) ×3 IMPLANT
PAD ARMBOARD 7.5X6 YLW CONV (MISCELLANEOUS) ×6 IMPLANT
SUT PROLENE 6 0 CC (SUTURE) ×5 IMPLANT
SUT VIC AB 3-0 SH 27 (SUTURE) ×3
SUT VIC AB 3-0 SH 27X BRD (SUTURE) ×1 IMPLANT
SYR CONTROL 10ML LL (SYRINGE) ×2 IMPLANT
UNDERPAD 30X30 (UNDERPADS AND DIAPERS) ×3 IMPLANT
WATER STERILE IRR 1000ML POUR (IV SOLUTION) ×3 IMPLANT

## 2016-09-17 NOTE — H&P (View-Only) (Signed)
Vascular and Vein Specialist of [facility]  Patient name: Scott Castillo MRN: [id number] DOB: 07-08-1960 Sex: male  REASON FOR VISIT: Discuss access for hemodialysis  HPI: Scott Castillo is a 56 y.o. male well-known to me from prior bilateral staged laser ablation of great saphenous vein for venous hypertension. He has extensive swelling and had had the ulcerations. These had healed. He does have a new superficial excoriation over his right ankle where he bumped this at work. Says very superficial appears to be healing. He now has renal insufficiency is approaching need for hemodialysis. He is seen today for discussion of this. He is right-handed.  Past Medical History:  Diagnosis Date  . Venous stasis dermatitis of both lower extremities     No family history on file.  SOCIAL HISTORY: Social History  Substance Use Topics  . Smoking status: Never Smoker  . Smokeless tobacco: Never Used  . Alcohol use No    Allergies  Allergen Reactions  . Pioglitazone Swelling    Current Outpatient Prescriptions  Medication Sig Dispense Refill  . carvedilol (COREG) 25 MG tablet Take 25 mg by mouth 2 (two) times daily with a meal.     . Cholecalciferol (VITAMIN D3) 1000 units CAPS Take 1,000 Units by mouth every morning.     Marland Kitchen CINNAMON PO Take 1 tablet by mouth 2 (two) times daily.     Marland Kitchen co-enzyme Q-10 50 MG capsule Take 100 mg by mouth daily.    Marland Kitchen DANDELION PO Take 1 tablet by mouth daily.     . Horse Chestnut 300 MG CAPS Take 1 capsule by mouth every morning.     . Insulin Degludec (TRESIBA FLEXTOUCH) 200 UNIT/ML SOPN Inject 10 Units into the skin every morning. 1 pen 11  . Misc Natural Products (ECHINACEA COMPOUND PO) Take 1 tablet by mouth 2 (two) times daily.     . Misc Natural Products (OSTEO BI-FLEX TRIPLE STRENGTH) TABS Take 1 tablet by mouth 2 (two) times daily.     . Multiple Vitamin (MULTIVITAMIN) tablet Take 1 tablet by mouth daily.    .  pravastatin (PRAVACHOL) 80 MG tablet Take 80 mg by mouth.    . Probiotic Product ([employer] ADV PROBIOTIC GUMMIES PO) Take 2 tablets by mouth every morning. gummy    . TURMERIC PO Take 2 tablets by mouth every morning.     . vitamin B-12 (CYANOCOBALAMIN) 500 MCG tablet Take 500 mcg by mouth daily.    . vitamin C (ASCORBIC ACID) 500 MG tablet Take 500 mg by mouth daily.    . vitamin E 1000 UNIT capsule Take 1,000 Units by mouth daily.    . furosemide (LASIX) 80 MG tablet Take 2 tablets (160 mg total) by mouth 2 (two) times daily. 120 tablet 1  . hydrALAZINE (APRESOLINE) 25 MG tablet Take 1 tablet (25 mg total) by mouth every 8 (eight) hours. 90 tablet 2   No current facility-administered medications for this visit.     REVIEW OF SYSTEMS:  [X]  denotes positive finding, [ ]  denotes negative finding Cardiac  Comments:  Chest pain or chest pressure:    Shortness of breath upon exertion:    Short of breath when lying flat:    Irregular heart rhythm:        Vascular    Pain in calf, thigh, or hip brought on by ambulation:    Pain in feet at night that wakes you up from your sleep:     Blood  clot in your veins:    Leg swelling:  x         PHYSICAL EXAM: Vitals:   09/14/16 0910 09/14/16 0911  BP: (!) 190/88 (!) 194/92  Pulse: 63 63  Resp: 16   Temp: 97.6 F (36.4 C)   SpO2: 98%   Weight: 272 lb (123.4 kg)   Height: 6\' 1"  (1.854 m)     GENERAL: The patient is a well-nourished male, in no acute distress. The vital signs are documented above. CARDIOVASCULAR: 2+ radial pulses bilaterally. Easily visible cephalic vein throughout his forearm bilaterally PULMONARY: There is good air exchange  MUSCULOSKELETAL: There are no Williamson deformities or cyanosis. NEUROLOGIC: No focal weakness or paresthesias are detected. SKIN: There are no ulcers or rashes noted. PSYCHIATRIC: The patient has a normal affect.  DATA:  Noninvasive studies reveal normal arterial flow to both upper extremities. Also a  nicely developed cephalic vein throughout their course on right and left arm.  MEDICAL ISSUES: Discuss options for hemodialysis access. This includes a tunneled catheter, AV fistula and AV graft. Have recommended a left AV graft. We will proceed with this on 09/10/2016. He is unclear as to whether or not he needs tunneled catheter as well. We will confirm this with Dr. Florene Glen prior to proceeding.    [name], MD FACS Vascular and Vein Specialists of [facility] Tel [phone] Pager [phone]

## 2016-09-17 NOTE — Progress Notes (Signed)
Notified Dr. Angus Palms about patient I-Stat 4 result, Md aware, no new orders at this time.

## 2016-09-17 NOTE — Anesthesia Preprocedure Evaluation (Addendum)
Anesthesia Evaluation  Patient identified by MRN, date of birth, ID band Patient awake    Reviewed: Allergy & Precautions, NPO status , Patient's Chart, lab work & pertinent test results  Airway Mallampati: III  TM Distance: >3 FB Neck ROM: Full    Dental no notable dental hx.    Pulmonary neg pulmonary ROS,    Pulmonary exam normal breath sounds clear to auscultation       Cardiovascular hypertension, Pt. on home beta blockers and Pt. on medications Normal cardiovascular exam Rhythm:Regular Rate:Normal  ECG: SR, rate 72 ECHO: Left ventricle: The cavity size was moderately dilated. Systolic function was normal. The estimated ejection fraction was in the range of 55% to 60%. Wall motion was normal; there were no regional wall motion abnormalities. Left ventricular diastolic function parameters were normal. - Aortic valve: Transvalvular velocity was within the normal range.   There was no stenosis. There was no regurgitation. - Mitral valve: Transvalvular velocity was within the normal range.   There was no evidence for stenosis. There was mild regurgitation. - Left atrium: The atrium was severely dilated. - Right ventricle: The cavity size was normal. Wall thickness was normal. Systolic function was normal. - Atrial septum: No defect or patent foramen ovale was identified by color flow Doppler. - Tricuspid valve: There was trivial regurgitation. - Pulmonary arteries: Systolic pressure was mildly increased. PA peak pressure: 44 mm Hg (S). - Pericardium, extracardiac: A trivial pericardial effusion was identified.   Neuro/Psych negative neurological ROS  negative psych ROS   GI/Hepatic negative GI ROS, Neg liver ROS,   Endo/Other  diabetes, Insulin Dependent  Renal/GU CRFRenal disease  negative genitourinary   Musculoskeletal negative musculoskeletal ROS (+)   Abdominal   Peds negative pediatric ROS (+)   Hematology  (+) anemia ,   Anesthesia Other Findings Obese, BMI 36 Hyperlipidemia  Reproductive/Obstetrics negative OB ROS                            Anesthesia Physical Anesthesia Plan  ASA: III  Anesthesia Plan: MAC   Post-op Pain Management:    Induction: Intravenous  Airway Management Planned: Simple Face Mask  Additional Equipment:   Intra-op Plan:   Post-operative Plan:   Informed Consent: I have reviewed the patients History and Physical, chart, labs and discussed the procedure including the risks, benefits and alternatives for the proposed anesthesia with the patient or authorized representative who has indicated his/her understanding and acceptance.   Dental advisory given  Plan Discussed with: CRNA  Anesthesia Plan Comments:         Anesthesia Quick Evaluation

## 2016-09-17 NOTE — Op Note (Signed)
    OPERATIVE REPORT  DATE OF SURGERY: 09/17/2016  PATIENT: Scott Castillo, 56 y.o. male MRN: [id number]  DOB: 03/07/1961  PRE-OPERATIVE DIAGNOSIS: Chronic renal insufficiency  POST-OPERATIVE DIAGNOSIS:  Same  PROCEDURE: Left radiocephalic AV fistula  SURGEON:  Curt Jews, M.D.  PHYSICIAN ASSISTANT: Nurse  ANESTHESIA:  Local with sedation  EBL: Minimal ml  Total I/O In: 300 [I.V.:300] Out: 5 [Blood:5]  BLOOD ADMINISTERED: None  DRAINS: None  SPECIMEN: None  COUNTS CORRECT:  YES  PLAN OF CARE: PACU   PATIENT DISPOSITION:  PACU - hemodynamically stable  PROCEDURE DETAILS: The patient was taken to the operating placed supine position where the area of the left arm was prepped and draped in usual sterile fashion. The vision was made between the level of the radial artery and the cephalic vein using local anesthesia. The vein was a very nice size. Ureter branches were ligated with 3-0 silk ties and divided. The vein was ligated distally and divided and was mobilized to the level of the radial artery. The radial artery was a good size. Did have a moderate atherosclerotic plaque. The artery was occluded proximal and distally was opened with 11 blade some ulcerative with Potts scissors. The vein was cut to appropriate length and was spatulated and sewn end-to-side to the artery with a running 6-0 Prolene suture. Clamps removed and excellent thrill was noted. Wounds irrigated with saline. Hemostasis tablet cautery. Wounds were closed with 3-0 Vicryl in the subcutaneous and subcuticular tissue. Sterile dressing was applied the patient was transferred to the recovery room in stable condition   [name], M.D., [facility] 09/17/2016 3:58 PM

## 2016-09-17 NOTE — Transfer of Care (Signed)
Immediate Anesthesia Transfer of Care Note  Patient: Scott Castillo  Procedure(s) Performed: Procedure(s): ARTERIOVENOUS (AV) FISTULA CREATION (Left)  Patient Location: PACU  Anesthesia Type:MAC  Level of Consciousness: awake, alert  and oriented  Airway & Oxygen Therapy: Patient Spontanous Breathing  Post-op Assessment: Report given to RN, Post -op Vital signs reviewed and stable and Patient moving all extremities X 4  Post vital signs: Reviewed and stable  Last Vitals:  Vitals:   09/17/16 1213 09/17/16 1544  BP: (!) 191/81   Pulse: 60   Resp: 18   Temp:  (!) 36 C    Last Pain:  Vitals:   09/17/16 1213  TempSrc: Oral      Patients Stated Pain Goal: 9 (24/81/85 9093)  Complications: No apparent anesthesia complications

## 2016-09-17 NOTE — Interval H&P Note (Signed)
History and Physical Interval Note:  09/17/2016 1:04 PM  Bonham E Masullo  has presented today for surgery, with the diagnosis of Chronic Kidney Disease Stage 5   N18.5  The various methods of treatment have been discussed with the patient and family. After consideration of risks, benefits and other options for treatment, the patient has consented to  Procedure(s): ARTERIOVENOUS (AV) FISTULA CREATION (Left) as a surgical intervention .  The patient's history has been reviewed, patient examined, no change in status, stable for surgery.  I have reviewed the patient's chart and labs.  Questions were answered to the patient's satisfaction.     Curt Jews

## 2016-09-18 NOTE — Anesthesia Postprocedure Evaluation (Signed)
Anesthesia Post Note  Patient: Scott Castillo  Procedure(s) Performed: Procedure(s) (LRB): ARTERIOVENOUS (AV) FISTULA CREATION (Left)     Patient location during evaluation: PACU Anesthesia Type: MAC Level of consciousness: awake and alert Pain management: pain level controlled Vital Signs Assessment: post-procedure vital signs reviewed and stable Respiratory status: spontaneous breathing, nonlabored ventilation, respiratory function stable and patient connected to nasal cannula oxygen Cardiovascular status: stable and blood pressure returned to baseline Anesthetic complications: no    Last Vitals:  Vitals:   09/17/16 1615 09/17/16 1618  BP: (!) 142/98 (!) 152/80  Pulse: 68 63  Resp: 16 19  Temp: 36.7 C     Last Pain:  Vitals:   09/17/16 1213  TempSrc: Oral                 Lauramae Kneisley P Magen Suriano

## 2016-09-19 ENCOUNTER — Encounter (HOSPITAL_COMMUNITY): Payer: Self-pay | Admitting: Vascular Surgery

## 2016-09-20 ENCOUNTER — Telehealth: Payer: Self-pay | Admitting: Vascular Surgery

## 2016-09-20 NOTE — Telephone Encounter (Signed)
-----   Message from Mena Goes, RN sent at 09/17/2016  3:26 PM EDT ----- Regarding: 4-6 weeks w/ duplex   ----- Message ----- From: Alvia Grove, PA-C Sent: 09/17/2016   3:14 PM To: Vvs Charge Pool  S/p left radial-cephalic AV fistula 2/0/91  F/u with Dr. Donnetta Hutching or NP in 4-6 weeks with duplex  Thanks Maudie Mercury

## 2016-09-20 NOTE — Telephone Encounter (Signed)
No duplex as per Zigmund Daniel. Sched appt 10/12/16 at 10:00 w/ [facility]. Lm on hm# for pt to confirm appt.

## 2016-09-29 ENCOUNTER — Encounter: Payer: Self-pay | Admitting: Vascular Surgery

## 2016-10-12 ENCOUNTER — Encounter: Payer: Self-pay | Admitting: Vascular Surgery

## 2016-10-12 ENCOUNTER — Ambulatory Visit (INDEPENDENT_AMBULATORY_CARE_PROVIDER_SITE_OTHER): Payer: Self-pay | Admitting: Vascular Surgery

## 2016-10-12 VITALS — BP 190/85 | HR 75 | Temp 97.5°F | Resp 16 | Ht 73.0 in | Wt 276.0 lb

## 2016-10-12 DIAGNOSIS — N185 Chronic kidney disease, stage 5: Secondary | ICD-10-CM

## 2016-10-12 NOTE — Progress Notes (Signed)
   Patient name: DSEAN VANTOL MRN: [id number] DOB: 27-Sep-1960 Sex: male  REASON FOR VISIT: Follow-up left knee fistula creation on 6-1- 2018  HPI: Yonah Tangeman Artiga is a 56 y.o. male here for follow-up. He has had complete healing of his wrist incision and has no steal symptoms. He reports minimal discomfort discomfort associated with this.  Current Outpatient Prescriptions  Medication Sig Dispense Refill  . calcium acetate (PHOSLO) 667 MG capsule Take 667 mg by mouth 3 (three) times daily with meals.    . carvedilol (COREG) 25 MG tablet Take 25 mg by mouth 2 (two) times daily with a meal.     . DANDELION PO Take 1,040 mg by mouth 2 (two) times daily.     . Insulin Degludec (TRESIBA FLEXTOUCH) 200 UNIT/ML SOPN Inject 10 Units into the skin every morning. 1 pen 11  . neomycin-bacitracin-polymyxin (NEOSPORIN) ointment Apply 1 application topically daily as needed for wound care. apply to eye    . pravastatin (PRAVACHOL) 80 MG tablet Take 80 mg by mouth every evening.     . hydrALAZINE (APRESOLINE) 25 MG tablet Take 1 tablet (25 mg total) by mouth every 8 (eight) hours. (Patient taking differently: Take 25 mg by mouth 3 (three) times daily. ) 90 tablet 2   No current facility-administered medications for this visit.      PHYSICAL EXAM: Vitals:   10/12/16 0949 10/12/16 0951  BP: (!) 187/78 (!) 190/85  Pulse: 75   Resp: 16   Temp: 97.5 F (36.4 C)   TempSrc: Oral   SpO2: 98%   Weight: 276 lb (125.2 kg)   Height: 6\' 1"  (1.854 m)     GENERAL: The patient is a well-nourished male, in no acute distress. The vital signs are documented above. Well-healed incision is a wrist. Excellent thrill throughout the cephalic vein. The cephalic vein is large caliber and runs directly under the skin and a very straight-line course.  MEDICAL ISSUES: Very good Oscar Hank result of his left AV fistula creation. I feel that he has a very high likelihood that this will be  successful for long-term access if needed. He will continue his usual activities without limitation will see [place] again on as-needed basis. I did explain the expected lifelong maintenance of access if used that we are available as needed. Would preferably wait 3 months prior to access but would be satisfactory to use this 2 months out from surgery if he progresses to complete renal failure   [name], MD [facility] Vascular and Vein Specialists of [facility] Tel [phone] Pager [phone]

## 2019-08-30 ENCOUNTER — Other Ambulatory Visit: Payer: Self-pay | Admitting: *Deleted

## 2019-08-30 DIAGNOSIS — R200 Anesthesia of skin: Secondary | ICD-10-CM

## 2019-09-04 ENCOUNTER — Encounter: Payer: Self-pay | Admitting: Vascular Surgery

## 2019-09-04 ENCOUNTER — Other Ambulatory Visit: Payer: Self-pay

## 2019-09-04 ENCOUNTER — Ambulatory Visit (HOSPITAL_COMMUNITY)
Admission: RE | Admit: 2019-09-04 | Discharge: 2019-09-04 | Disposition: A | Discharge status: Home / Self Care | Payer: BC Managed Care – PPO | Source: Ambulatory Visit | Attending: Vascular Surgery | Admitting: Vascular Surgery

## 2019-09-04 ENCOUNTER — Ambulatory Visit (INDEPENDENT_AMBULATORY_CARE_PROVIDER_SITE_OTHER): Payer: BC Managed Care – PPO | Admitting: Vascular Surgery

## 2019-09-04 DIAGNOSIS — N186 End stage renal disease: Secondary | ICD-10-CM

## 2019-09-04 DIAGNOSIS — R200 Anesthesia of skin: Secondary | ICD-10-CM

## 2019-09-04 NOTE — Progress Notes (Signed)
Patient name: Scott Castillo MRN: [id number] DOB: 05/14/1960 Sex: male  REASON FOR CONSULT: Evaluate steal and decreased flow in left arm AV fistula  HPI: Scott Castillo is a 59 y.o. male, with end-stage renal disease that presents for evaluation of left arm AV fistula.  Per the referral notes from nephrology there is concern for decreased flow as well as numbness in the left hand.  Patient has a left radiocephalic AV fistula that was placed by Dr. Donnetta Hutching 09/17/2016.  Patient states that he has had numbness in all 5 digits of the left hand for about the last month.  No overt weakness no tissue loss.  There is also noted concern for decreased flow in the fistula per the nephrology notes.  Appears he has had a fistulogram in 2018 at [facility] with angioplasty of a 60% arterial anastomosis stenosis and 60% outflow stenosis in the cephalic vein.  Does not want access in right arm and works at [employer].    Past Medical History:  Diagnosis Date  . Venous stasis dermatitis of both lower extremities     Past Surgical History:  Procedure Laterality Date  . AV FISTULA PLACEMENT Left 09/17/2016   Procedure: ARTERIOVENOUS (AV) FISTULA CREATION;  Surgeon: [name], MD;  Location: [facility] OR;  Service: Vascular;  Laterality: Left;  . ENDOVENOUS ABLATION SAPHENOUS VEIN W/ LASER Right 04/08/2016   EVLA R greater saphenous vein by Curt Jews MD  . ENDOVENOUS ABLATION SAPHENOUS VEIN W/ LASER Left 05/13/2016   endovenous laser ablation (left greater saphenous vein) by Curt Jews MD     Family History  Problem Relation Age of Onset  . Heart attack Father     SOCIAL HISTORY: Social History   Socioeconomic History  . Marital status: Married    Spouse name: Not on file  . Number of children: Not on file  . Years of education: Not on file  . Highest education level: Not on file  Occupational History  . Not on file  Tobacco Use  . Smoking status: Never Smoker  . Smokeless tobacco: Never Used    Substance and Sexual Activity  . Alcohol use: No  . Drug use: No  . Sexual activity: Not on file  Other Topics Concern  . Not on file  Social History Narrative  . Not on file   Social Determinants of Health   Financial Resource Strain:   . Difficulty of Paying Living Expenses:   Food Insecurity:   . Worried About Charity fundraiser in the Last Year:   . Arboriculturist in the Last Year:   Transportation Needs:   . Film/video editor (Medical):   Marland Kitchen Lack of Transportation (Non-Medical):   Physical Activity:   . Days of Exercise per Week:   . Minutes of Exercise per Session:   Stress:   . Feeling of Stress :   Social Connections:   . Frequency of Communication with Friends and Family:   . Frequency of Social Gatherings with Friends and Family:   . Attends Religious Services:   . Active Member of Clubs or Organizations:   . Attends Archivist Meetings:   Marland Kitchen Marital Status:   Intimate Partner Violence:   . Fear of Current or Ex-Partner:   . Emotionally Abused:   Marland Kitchen Physically Abused:   . Sexually Abused:     Allergies  Allergen Reactions  . Pioglitazone Swelling  . Adhesive [Tape] Other (See Comments)  Causes redness, pt prefers paper tape   . Other Other (See Comments)    Causes redness, pt prefers paper tape     Current Outpatient Medications  Medication Sig Dispense Refill  . calcium acetate (PHOSLO) 667 MG capsule Take 667 mg by mouth 3 (three) times daily with meals.    . carvedilol (COREG) 25 MG tablet Take 25 mg by mouth 2 (two) times daily with a meal.     . DANDELION PO Take 1,040 mg by mouth 2 (two) times daily.     . Insulin Degludec (TRESIBA FLEXTOUCH) 200 UNIT/ML SOPN Inject 10 Units into the skin every morning. 1 pen 11  . neomycin-bacitracin-polymyxin (NEOSPORIN) ointment Apply 1 application topically daily as needed for wound care. apply to eye    . pravastatin (PRAVACHOL) 80 MG tablet Take 80 mg by mouth every evening.     .  hydrALAZINE (APRESOLINE) 25 MG tablet Take 1 tablet (25 mg total) by mouth every 8 (eight) hours. (Patient taking differently: Take 25 mg by mouth 3 (three) times daily. ) 90 tablet 2   No current facility-administered medications for this visit.    REVIEW OF SYSTEMS:  [X]  denotes positive finding, [ ]  denotes negative finding Cardiac  Comments:  Chest pain or chest pressure:    Shortness of breath upon exertion:    Short of breath when lying flat:    Irregular heart rhythm:        Vascular    Pain in calf, thigh, or hip brought on by ambulation:    Pain in feet at night that wakes you up from your sleep:     Blood clot in your veins:    Leg swelling:         Pulmonary    Oxygen at home:    Productive cough:     Wheezing:         Neurologic    Sudden weakness in arms or legs:     Sudden numbness in arms or legs:     Sudden onset of difficulty speaking or slurred speech:    Temporary loss of vision in one eye:     Problems with dizziness:         Gastrointestinal    Blood in stool:     Vomited blood:         Genitourinary    Burning when urinating:     Blood in urine:        Psychiatric    Kings depression:         Hematologic    Bleeding problems:    Problems with blood clotting too easily:        Skin    Rashes or ulcers:        Constitutional    Fever or chills:      PHYSICAL EXAM: Vitals:   09/04/19 0906  BP: (!) 144/86  Pulse: 80  Resp: 18  Temp: 97.7 F (36.5 C)  TempSrc: Temporal  SpO2: 97%  Weight: 245 lb (111.1 kg)  Height: 6' (1.829 m)    GENERAL: The patient is a well-nourished male, in no acute distress. The vital signs are documented above. CARDIAC: There is a regular rate and rhythm.  VASCULAR:  Left brachial pulse easily palpable Good thrill in the left radiocephalic fistula at the wrist but more pulsatile in the upper forearm Left ulnar pulse 1+ PULMONARY: There is good air exchange bilaterally without wheezing or  rales. ABDOMEN: Soft and non-tender  with normal pitched bowel sounds.  MUSCULOSKELETAL: There are no Catano deformities or cyanosis. NEUROLOGIC: No focal weakness or paresthesias are detected. SKIN: There are no ulcers or rashes noted. PSYCHIATRIC: The patient has a normal affect.  DATA:   Steal study here showed slight increase in the digital pressure from 82 to 110 with compression of the fistula.  Assessment/Plan:  59 year old male presents for evaluation of left radiocephalic AV fistula placed in 2018 by Dr. Donnetta Hutching.  Discussed that essentially dealing with two separate problems given that if we address the flow issues potentially could make his steal symptoms worse.  Ultimately he feels the numbness in the left hand is tolerable at this time and does not want fistula ligation and evaluation in the contralateral arm since his right arm is dominant and he is working.  He has a very nice brachial pulse and discussed that given this is already a radiocephalic fistula there is really no other option for bypass like a DRIL etc.  He wants to try and salvage the fistula so we will plan for fistulogram and certainly can evaluate runoff into the hand at that time.  Risks and benefits discussed.    Marty Heck, MD [facility] of [place] Office: [phone]

## 2019-09-25 ENCOUNTER — Other Ambulatory Visit (HOSPITAL_COMMUNITY)
Admission: RE | Admit: 2019-09-25 | Discharge: 2019-09-25 | Disposition: A | Discharge status: Home / Self Care | Payer: BC Managed Care – PPO | Source: Ambulatory Visit | Attending: Vascular Surgery | Admitting: Vascular Surgery

## 2019-09-25 DIAGNOSIS — Z01812 Encounter for preprocedural laboratory examination: Secondary | ICD-10-CM | POA: Insufficient documentation

## 2019-09-25 DIAGNOSIS — Z20822 Contact with and (suspected) exposure to covid-19: Secondary | ICD-10-CM | POA: Insufficient documentation

## 2019-09-25 LAB — SARS CORONAVIRUS 2 (TAT 6-24 HRS): SARS Coronavirus 2: NEGATIVE

## 2019-09-27 ENCOUNTER — Encounter (HOSPITAL_COMMUNITY): Payer: Self-pay | Admitting: Vascular Surgery

## 2019-09-27 ENCOUNTER — Encounter (HOSPITAL_COMMUNITY)
Admission: RE | Disposition: A | Discharge status: Home / Self Care | Payer: Self-pay | Source: Home / Self Care | Attending: Vascular Surgery

## 2019-09-27 ENCOUNTER — Ambulatory Visit (HOSPITAL_COMMUNITY)
Admission: RE | Admit: 2019-09-27 | Discharge: 2019-09-27 | Disposition: A | Discharge status: Home / Self Care | Payer: BC Managed Care – PPO | Attending: Vascular Surgery | Admitting: Vascular Surgery

## 2019-09-27 DIAGNOSIS — N186 End stage renal disease: Secondary | ICD-10-CM | POA: Insufficient documentation

## 2019-09-27 DIAGNOSIS — Y841 Kidney dialysis as the cause of abnormal reaction of the patient, or of later complication, without mention of misadventure at the time of the procedure: Secondary | ICD-10-CM | POA: Insufficient documentation

## 2019-09-27 DIAGNOSIS — Z794 Long term (current) use of insulin: Secondary | ICD-10-CM | POA: Insufficient documentation

## 2019-09-27 DIAGNOSIS — Z79899 Other long term (current) drug therapy: Secondary | ICD-10-CM | POA: Insufficient documentation

## 2019-09-27 DIAGNOSIS — Z888 Allergy status to other drugs, medicaments and biological substances status: Secondary | ICD-10-CM | POA: Diagnosis not present

## 2019-09-27 DIAGNOSIS — T82898A Other specified complication of vascular prosthetic devices, implants and grafts, initial encounter: Secondary | ICD-10-CM

## 2019-09-27 DIAGNOSIS — Z992 Dependence on renal dialysis: Secondary | ICD-10-CM | POA: Diagnosis not present

## 2019-09-27 DIAGNOSIS — T82510A Breakdown (mechanical) of surgically created arteriovenous fistula, initial encounter: Secondary | ICD-10-CM | POA: Diagnosis not present

## 2019-09-27 HISTORY — PX: A/V FISTULAGRAM: CATH118298

## 2019-09-27 LAB — POCT I-STAT, CHEM 8
BUN: 28 mg/dL — ABNORMAL HIGH (ref 6–20)
Calcium, Ion: 1.17 mmol/L (ref 1.15–1.40)
Chloride: 95 mmol/L — ABNORMAL LOW (ref 98–111)
Creatinine, Ser: 7.2 mg/dL — ABNORMAL HIGH (ref 0.61–1.24)
Glucose, Bld: 205 mg/dL — ABNORMAL HIGH (ref 70–99)
HCT: 36 % — ABNORMAL LOW (ref 39.0–52.0)
Hemoglobin: 12.2 g/dL — ABNORMAL LOW (ref 13.0–17.0)
Potassium: 4.3 mmol/L (ref 3.5–5.1)
Sodium: 138 mmol/L (ref 135–145)
TCO2: 32 mmol/L (ref 22–32)

## 2019-09-27 LAB — GLUCOSE, CAPILLARY: Glucose-Capillary: 188 mg/dL — ABNORMAL HIGH (ref 70–99)

## 2019-09-27 SURGERY — A/V FISTULAGRAM
Anesthesia: LOCAL | Laterality: Left

## 2019-09-27 MED ORDER — SODIUM CHLORIDE 0.9% FLUSH: 3.0000 mL | Freq: Two times a day (BID) | INTRAVENOUS | Status: DC

## 2019-09-27 MED ORDER — HEPARIN (PORCINE) IN NACL 1000-0.9 UT/500ML-% IV SOLN
INTRAVENOUS | Status: AC
  Filled 2019-09-27: qty 500

## 2019-09-27 MED ORDER — HEPARIN SODIUM (PORCINE) 1000 UNIT/ML IJ SOLN
INTRAMUSCULAR | Status: DC | PRN
  Administered 2019-09-27: 3000 [IU] via INTRAVENOUS

## 2019-09-27 MED ORDER — HEPARIN (PORCINE) IN NACL 1000-0.9 UT/500ML-% IV SOLN
INTRAVENOUS | Status: DC | PRN
  Administered 2019-09-27: 500 mL

## 2019-09-27 MED ORDER — SODIUM CHLORIDE 0.9 % IV SOLN: 250.0000 mL | INTRAVENOUS | Status: DC | PRN

## 2019-09-27 MED ORDER — IODIXANOL 320 MG/ML IV SOLN
INTRAVENOUS | Status: DC | PRN
  Administered 2019-09-27: 70 mL via INTRAVENOUS

## 2019-09-27 MED ORDER — LIDOCAINE HCL (PF) 1 % IJ SOLN
INTRAMUSCULAR | Status: DC | PRN
  Administered 2019-09-27: 3 mL

## 2019-09-27 MED ORDER — HEPARIN SODIUM (PORCINE) 1000 UNIT/ML IJ SOLN
INTRAMUSCULAR | Status: AC
  Filled 2019-09-27: qty 1

## 2019-09-27 MED ORDER — LIDOCAINE HCL (PF) 1 % IJ SOLN
INTRAMUSCULAR | Status: AC
  Filled 2019-09-27: qty 30

## 2019-09-27 MED ORDER — SODIUM CHLORIDE 0.9% FLUSH: 3.0000 mL | INTRAVENOUS | Status: DC | PRN

## 2019-09-27 SURGICAL SUPPLY — 14 items
BAG SNAP BAND KOVER 36X36 (MISCELLANEOUS) ×2 IMPLANT
CATH ANGIO 5F BER2 100CM (CATHETERS) ×1 IMPLANT
CATH ANGIO 5F BER2 65CM (CATHETERS) ×1 IMPLANT
COVER DOME SNAP 22 D (MISCELLANEOUS) ×2 IMPLANT
KIT MICROPUNCTURE NIT STIFF (SHEATH) ×1 IMPLANT
PROTECTION STATION PRESSURIZED (MISCELLANEOUS) ×2
SHEATH PINNACLE R/O II 6F 4CM (SHEATH) ×1 IMPLANT
SHEATH PROBE COVER 6X72 (BAG) ×3 IMPLANT
STATION PROTECTION PRESSURIZED (MISCELLANEOUS) ×1 IMPLANT
STOPCOCK MORSE 400PSI 3WAY (MISCELLANEOUS) ×2 IMPLANT
TRAY PV CATH (CUSTOM PROCEDURE TRAY) ×2 IMPLANT
TUBING CIL FLEX 10 FLL-RA (TUBING) ×2 IMPLANT
WIRE BENTSON .035X145CM (WIRE) ×1 IMPLANT
WIRE HI TORQ VERSACORE J 260CM (WIRE) ×1 IMPLANT

## 2019-09-27 NOTE — Discharge Instructions (Signed)

## 2019-09-27 NOTE — Op Note (Signed)
OPERATIVE NOTE   PROCEDURE: 1. left radiocephalic arteriovenous fistula cannulation under ultrasound guidance 2. left arm  fistulogram including central venogram  PRE-OPERATIVE DIAGNOSIS: Malfunctioning left arteriovenous fistula (poor flow volumes)  POST-OPERATIVE DIAGNOSIS: same as above   SURGEON: Marty Heck, MD  ANESTHESIA: local  ESTIMATED BLOOD LOSS: 5 cc  FINDING(S): 1. The left radiocephalic fistula was widely patent throughout the forearm as well as the upper arm.  The fistula also drained into the basilic system.  At the cephalic arch the cephalic vein appeared to branch into a bifurcated system for short distance before emptying into the subclavian vein and all the central structures were widely patent.  Ultimately we put a catheter up to further evaluate this and where the cephalic vein appeared to branch there was no focal stenosis identified and it was hard to determine which one was the main branch.  Given that he states the fistula has been working well with good flow volume since I saw him in clinic I elected not to intervene and he has a good thrill on exam.  SPECIMEN(S):  None  CONTRAST: 70 cc  INDICATIONS: Scott Castillo is a 59 y.o. male who  presents with malfunctioning left radiocephalic arteriovenous fistula.  The patient is scheduled for left arm fistulogram.  The patient is aware the risks include but are not limited to: bleeding, infection, thrombosis of the cannulated access, and possible anaphylactic reaction to the contrast.  The patient is aware of the risks of the procedure and elects to proceed forward.  DESCRIPTION: After full informed written consent was obtained, the patient was brought back to the angiography suite and placed supine upon the angiography table.  The patient was connected to monitoring equipment.  The left arm  was prepped and draped in the standard fashion for a left arm fistulogram.  Under ultrasound guidance, the  fistula was evaluated, it was patent, an image was saved.  The arteriovenous fistula was cannulated with a micropuncture needle.  The microwire was advanced into the fistula and the needle was exchanged for a microsheath, which was lodged 2 cm into the access.  The wire was removed and the sheath was connected to the IV extension tubing.  Hand injections were completed to image the access from the antecubitum up to the level of axilla.  The central venous structures were also imaged by hand injections.  In order to get better imaging of the cephalic arch we put a versa core wire up along with a Ber catheter and exchanged for a short 6 French sheath.  Patient was given 3000 units of IV heparin.  Ultimately in order to get better dedicated imaging given the only thing I saw was where the cephalic vein emptied centrally by the arch there appeared to be at least one collateral in the chest wall and then it split into a bifurcated system before draining into subclavian vein.  Ultimately the two main branches draining into the central system appeared to be equally contributed to the outflow of the cephalic vein with no focal stenosis in either branch.  He has a good thrill and states the fistula has been working better since I last evaluated him in clinic.  As a result elected not to intervene.  A 4-0 pursestring was placed around the sheath and it was removed.  COMPLICATIONS: None  CONDITION: Stable  Plan: We will arrange follow-up in 1 month in the [facility] clinic to see how his left arm numbness is  doing.  Marty Heck, MD [facility]: [phone]  09/27/2019 8:31 AM

## 2019-09-27 NOTE — H&P (Signed)
History and Physical Interval Note:  09/27/2019 7:52 AM  Scott Castillo  has presented today for surgery, with the diagnosis of End Stage Renal Disease.  The various methods of treatment have been discussed with the patient and family. After consideration of risks, benefits and other options for treatment, the patient has consented to  Procedure(s): A/V FISTULAGRAM (Left) as a surgical intervention.  The patient's history has been reviewed, patient examined, no change in status, stable for surgery.  I have reviewed the patient's chart and labs.  Questions were answered to the patient's satisfaction.    Left arm fistulogram.  Marty Heck  Patient name: Scott Castillo           MRN: [id number]        DOB: 07/30/1960          Sex: male  REASON FOR CONSULT: Evaluate steal and decreased flow in left arm AV fistula  HPI: Scott Castillo is a 59 y.o. male, with end-stage renal disease that presents for evaluation of left arm AV fistula.  Per the referral notes from nephrology there is concern for decreased flow as well as numbness in the left hand.  Patient has a left radiocephalic AV fistula that was placed by Dr. Donnetta Hutching 09/17/2016.  Patient states that he has had numbness in all 5 digits of the left hand for about the last month.  No overt weakness no tissue loss.  There is also noted concern for decreased flow in the fistula per the nephrology notes.  Appears he has had a fistulogram in 2018 at [facility] with angioplasty of a 60% arterial anastomosis stenosis and 60% outflow stenosis in the cephalic vein.  Does not want access in right arm and works at [employer].        Past Medical History:  Diagnosis Date  . Venous stasis dermatitis of both lower extremities          Past Surgical History:  Procedure Laterality Date  . AV FISTULA PLACEMENT Left 09/17/2016   Procedure: ARTERIOVENOUS (AV) FISTULA CREATION;  Surgeon: [name], MD;  Location: [facility] OR;  Service: Vascular;  Laterality:  Left;  . ENDOVENOUS ABLATION SAPHENOUS VEIN W/ LASER Right 04/08/2016   EVLA R greater saphenous vein by Curt Jews MD  . ENDOVENOUS ABLATION SAPHENOUS VEIN W/ LASER Left 05/13/2016   endovenous laser ablation (left greater saphenous vein) by Curt Jews MD          Family History  Problem Relation Age of Onset  . Heart attack Father     SOCIAL HISTORY: Social History        Socioeconomic History  . Marital status: Married    Spouse name: Not on file  . Number of children: Not on file  . Years of education: Not on file  . Highest education level: Not on file  Occupational History  . Not on file  Tobacco Use  . Smoking status: Never Smoker  . Smokeless tobacco: Never Used  Substance and Sexual Activity  . Alcohol use: No  . Drug use: No  . Sexual activity: Not on file  Other Topics Concern  . Not on file  Social History Narrative  . Not on file   Social Determinants of Health      Financial Resource Strain:   . Difficulty of Paying Living Expenses:   Food Insecurity:   . Worried About Charity fundraiser in the Last Year:   . [employer] of  Food in the Last Year:   Transportation Needs:   . Film/video editor (Medical):   Marland Kitchen Lack of Transportation (Non-Medical):   Physical Activity:   . Days of Exercise per Week:   . Minutes of Exercise per Session:   Stress:   . Feeling of Stress :   Social Connections:   . Frequency of Communication with Friends and Family:   . Frequency of Social Gatherings with Friends and Family:   . Attends Religious Services:   . Active Member of Clubs or Organizations:   . Attends Archivist Meetings:   Marland Kitchen Marital Status:   Intimate Partner Violence:   . Fear of Current or Ex-Partner:   . Emotionally Abused:   Marland Kitchen Physically Abused:   . Sexually Abused:          Allergies  Allergen Reactions  . Pioglitazone Swelling  . Adhesive [Tape] Other (See Comments)    Causes redness, pt prefers paper tape   .  Other Other (See Comments)    Causes redness, pt prefers paper tape           Current Outpatient Medications  Medication Sig Dispense Refill  . calcium acetate (PHOSLO) 667 MG capsule Take 667 mg by mouth 3 (three) times daily with meals.    . carvedilol (COREG) 25 MG tablet Take 25 mg by mouth 2 (two) times daily with a meal.     . DANDELION PO Take 1,040 mg by mouth 2 (two) times daily.     . Insulin Degludec (TRESIBA FLEXTOUCH) 200 UNIT/ML SOPN Inject 10 Units into the skin every morning. 1 pen 11  . neomycin-bacitracin-polymyxin (NEOSPORIN) ointment Apply 1 application topically daily as needed for wound care. apply to eye    . pravastatin (PRAVACHOL) 80 MG tablet Take 80 mg by mouth every evening.     . hydrALAZINE (APRESOLINE) 25 MG tablet Take 1 tablet (25 mg total) by mouth every 8 (eight) hours. (Patient taking differently: Take 25 mg by mouth 3 (three) times daily. ) 90 tablet 2   No current facility-administered medications for this visit.    REVIEW OF SYSTEMS:  [X]  denotes positive finding, [ ]  denotes negative finding Cardiac  Comments:  Chest pain or chest pressure:    Shortness of breath upon exertion:    Short of breath when lying flat:    Irregular heart rhythm:        Vascular    Pain in calf, thigh, or hip brought on by ambulation:    Pain in feet at night that wakes you up from your sleep:     Blood clot in your veins:    Leg swelling:         Pulmonary    Oxygen at home:    Productive cough:     Wheezing:         Neurologic    Sudden weakness in arms or legs:     Sudden numbness in arms or legs:     Sudden onset of difficulty speaking or slurred speech:    Temporary loss of vision in one eye:     Problems with dizziness:         Gastrointestinal    Blood in stool:     Vomited blood:         Genitourinary    Burning when urinating:     Blood in urine:         Psychiatric    [place] depression:  Hematologic    Bleeding problems:    Problems with blood clotting too easily:        Skin    Rashes or ulcers:        Constitutional    Fever or chills:      PHYSICAL EXAM: Vitals:   09/04/19 0906  BP: (!) 144/86  Pulse: 80  Resp: 18  Temp: 97.7 F (36.5 C)  TempSrc: Temporal  SpO2: 97%  Weight: 245 lb (111.1 kg)  Height: 6' (1.829 m)    GENERAL: The patient is a well-nourished male, in no acute distress. The vital signs are documented above. CARDIAC: There is a regular rate and rhythm.  VASCULAR:  Left brachial pulse easily palpable Good thrill in the left radiocephalic fistula at the wrist but more pulsatile in the upper forearm Left ulnar pulse 1+ PULMONARY: There is good air exchange bilaterally without wheezing or rales. ABDOMEN: Soft and non-tender with normal pitched bowel sounds.  MUSCULOSKELETAL: There are no Girtman deformities or cyanosis. NEUROLOGIC: No focal weakness or paresthesias are detected. SKIN: There are no ulcers or rashes noted. PSYCHIATRIC: The patient has a normal affect.  DATA:   Steal study here showed slight increase in the digital pressure from 82 to 110 with compression of the fistula.  Assessment/Plan:  59 year old male presents for evaluation of left radiocephalic AV fistula placed in 2018 by Dr. Donnetta Hutching.  Discussed that essentially dealing with two separate problems given that if we address the flow issues potentially could make his steal symptoms worse.  Ultimately he feels the numbness in the left hand is tolerable at this time and does not want fistula ligation and evaluation in the contralateral arm since his right arm is dominant and he is working.  He has a very nice brachial pulse and discussed that given this is already a radiocephalic fistula there is really no other option for bypass like a DRIL etc.  He wants to try and salvage the fistula so we will  plan for fistulogram and certainly can evaluate runoff into the hand at that time.  Risks and benefits discussed.    Marty Heck, MD [facility] [place] Office: [phone]

## 2019-09-27 NOTE — Progress Notes (Signed)
Discharge instructions reviewed with patient and family. Verbalized understanding. 

## 2019-10-30 ENCOUNTER — Ambulatory Visit: Payer: BC Managed Care – PPO | Admitting: Physician Assistant

## 2019-10-30 ENCOUNTER — Other Ambulatory Visit: Payer: Self-pay

## 2019-10-30 VITALS — BP 102/64 | HR 75 | Temp 97.9°F | Resp 20 | Ht 72.0 in | Wt 255.5 lb

## 2019-10-30 DIAGNOSIS — N186 End stage renal disease: Secondary | ICD-10-CM

## 2019-10-30 NOTE — Progress Notes (Signed)
POST OPERATIVE OFFICE NOTE    CC:  F/u for fistulogram  HPI:  This is a 59 y.o. male who is s/p left radiocephalic AVF approximately 3 years ago. He recently developed numbness of the digits of his left hand and was referred here for evaluation.  He underwent fistulogram and central venogram on 09/27/2019 by Dr. Carlis Abbott with negative findings.  He presents today for routine follow-up.  He states the numbness is present but that he "is learning to cope with it".  He has noticed mild decrease in fine motor function.  He denies coolness or hand pain. Does not report issues with HD treatment.  HD days: MWF HD clinic: [facility] ([facility])  Allergies  Allergen Reactions  . Pioglitazone Swelling  . Adhesive [Tape] Other (See Comments)    Causes redness, pt prefers paper tape   . Other Other (See Comments)    Causes redness, pt prefers paper tape     Current Outpatient Medications  Medication Sig Dispense Refill  . AURYXIA 1 GM 210 MG(Fe) tablet TAKE 2 TABLETS BY MOUTH THREE TIMES A DAY WITH MEALS AND 1 TABLET WITH SNACKS    . carvedilol (COREG) 25 MG tablet Take 25 mg by mouth 2 (two) times daily with a meal.     . heparin 1000 unit/mL SOLN injection Heparin Sodium (Porcine) 1,000 Units/mL Systemic    . Insulin Degludec (TRESIBA FLEXTOUCH) 200 UNIT/ML SOPN Inject 10 Units into the skin every morning. 1 pen 11  . Insulin Pen Needle (BD ULTRA-FINE PEN NEEDLES) 29G X 12.7MM MISC USE AS DIRECTED EVERY DAY    . irbesartan (AVAPRO) 150 MG tablet irbesartan tablet    . neomycin-bacitracin-polymyxin (NEOSPORIN) ointment Apply 1 application topically daily as needed for wound care. apply to eye    . pravastatin (PRAVACHOL) 80 MG tablet Take 80 mg by mouth every evening.     . Semaglutide (RYBELSUS) 14 MG TABS TAKE 1 TABLET (14 MG TOTAL) BY MOUTH EVERY MORNING. DIABETES    . VITAMIN D PO Take by mouth.     No current facility-administered medications for this visit.     ROS:  See HPI  BP  102/64 (BP Location: Right Arm, Patient Position: Sitting, Cuff Size: Large)   Pulse 75   Temp 97.9 F (36.6 C) (Temporal)   Resp 20   Ht 6' (1.829 m)   Wt 255 lb 8 oz (115.9 kg)   SpO2 99%   BMI 34.65 kg/m   Physical Exam:  General appearance: WD, WN in NAD Cardiac: RRR Respiratory: nonlabored Incision:  Well healed Extremities:  LUE: good thrill in fistula. No skin changes. Sensation intact. 5/5 bil grip strength  Neuro: A and O times 4  FINDING(S): 1. The left radiocephalic fistula was widely patent throughout the forearm as well as the upper arm.The fistula also drained into the basilic system. Atthe cephalic arch the cephalic vein appeared to branch into a bifurcated system for short distancebefore emptying into the subclavian vein and all the central structures were widely patent. Ultimately we put a catheter up to further evaluate this and where the cephalic vein appeared to branch there was no focal stenosis identified and it was hard to determine which one was the main branch. Given that he states the fistula has been working well with good flowvolume since I saw him in clinic I elected not to intervene and he has a good thrill on exam.   Assessment/Plan:  This is a 59 y.o. male who is  s/p fistulogram/central venogram. Numbness of digits of left hand. No change. Motor and sensation intact. HD treatment without complications.  We discussed continuing to self monitor his symptoms and to call us as needed if they worsen.  Risa Grill, PA-C [facility] [phone]  Clinic MD:  Carlis Abbott

## 2020-05-02 DIAGNOSIS — I340 Nonrheumatic mitral (valve) insufficiency: Secondary | ICD-10-CM | POA: Diagnosis not present

## 2020-06-16 ENCOUNTER — Inpatient Hospital Stay (HOSPITAL_COMMUNITY)
Admission: EM | Admit: 2020-06-16 | Discharge: 2020-06-19 | DRG: 070 | Disposition: A | Discharge status: Home / Self Care | Payer: Medicare Other | Source: Ambulatory Visit | Attending: Family Medicine | Admitting: Family Medicine

## 2020-06-16 ENCOUNTER — Observation Stay (HOSPITAL_COMMUNITY): Payer: Medicare Other

## 2020-06-16 ENCOUNTER — Encounter (HOSPITAL_COMMUNITY): Payer: Self-pay | Admitting: Student in an Organized Health Care Education/Training Program

## 2020-06-16 ENCOUNTER — Emergency Department (HOSPITAL_COMMUNITY): Payer: Medicare Other

## 2020-06-16 DIAGNOSIS — E785 Hyperlipidemia, unspecified: Secondary | ICD-10-CM | POA: Diagnosis present

## 2020-06-16 DIAGNOSIS — Z888 Allergy status to other drugs, medicaments and biological substances status: Secondary | ICD-10-CM

## 2020-06-16 DIAGNOSIS — E1141 Type 2 diabetes mellitus with diabetic mononeuropathy: Secondary | ICD-10-CM | POA: Diagnosis present

## 2020-06-16 DIAGNOSIS — R4701 Aphasia: Secondary | ICD-10-CM | POA: Diagnosis not present

## 2020-06-16 DIAGNOSIS — R410 Disorientation, unspecified: Secondary | ICD-10-CM

## 2020-06-16 DIAGNOSIS — N179 Acute kidney failure, unspecified: Secondary | ICD-10-CM | POA: Diagnosis present

## 2020-06-16 DIAGNOSIS — Z20822 Contact with and (suspected) exposure to covid-19: Secondary | ICD-10-CM | POA: Diagnosis present

## 2020-06-16 DIAGNOSIS — I161 Hypertensive emergency: Secondary | ICD-10-CM

## 2020-06-16 DIAGNOSIS — G8191 Hemiplegia, unspecified affecting right dominant side: Secondary | ICD-10-CM | POA: Diagnosis present

## 2020-06-16 DIAGNOSIS — I872 Venous insufficiency (chronic) (peripheral): Secondary | ICD-10-CM | POA: Diagnosis present

## 2020-06-16 DIAGNOSIS — Z91040 Latex allergy status: Secondary | ICD-10-CM

## 2020-06-16 DIAGNOSIS — E878 Other disorders of electrolyte and fluid balance, not elsewhere classified: Secondary | ICD-10-CM | POA: Diagnosis present

## 2020-06-16 DIAGNOSIS — G9341 Metabolic encephalopathy: Secondary | ICD-10-CM | POA: Diagnosis not present

## 2020-06-16 DIAGNOSIS — Z79899 Other long term (current) drug therapy: Secondary | ICD-10-CM

## 2020-06-16 DIAGNOSIS — D638 Anemia in other chronic diseases classified elsewhere: Secondary | ICD-10-CM | POA: Diagnosis not present

## 2020-06-16 DIAGNOSIS — R4182 Altered mental status, unspecified: Secondary | ICD-10-CM | POA: Diagnosis not present

## 2020-06-16 DIAGNOSIS — E1151 Type 2 diabetes mellitus with diabetic peripheral angiopathy without gangrene: Secondary | ICD-10-CM | POA: Diagnosis present

## 2020-06-16 DIAGNOSIS — I160 Hypertensive urgency: Secondary | ICD-10-CM | POA: Diagnosis not present

## 2020-06-16 DIAGNOSIS — D631 Anemia in chronic kidney disease: Secondary | ICD-10-CM | POA: Diagnosis present

## 2020-06-16 DIAGNOSIS — Z794 Long term (current) use of insulin: Secondary | ICD-10-CM

## 2020-06-16 DIAGNOSIS — M47812 Spondylosis without myelopathy or radiculopathy, cervical region: Secondary | ICD-10-CM | POA: Diagnosis present

## 2020-06-16 DIAGNOSIS — E1169 Type 2 diabetes mellitus with other specified complication: Secondary | ICD-10-CM | POA: Diagnosis not present

## 2020-06-16 DIAGNOSIS — N186 End stage renal disease: Secondary | ICD-10-CM | POA: Diagnosis present

## 2020-06-16 DIAGNOSIS — F801 Expressive language disorder: Secondary | ICD-10-CM

## 2020-06-16 DIAGNOSIS — E669 Obesity, unspecified: Secondary | ICD-10-CM

## 2020-06-16 DIAGNOSIS — I674 Hypertensive encephalopathy: Secondary | ICD-10-CM | POA: Diagnosis present

## 2020-06-16 DIAGNOSIS — E1122 Type 2 diabetes mellitus with diabetic chronic kidney disease: Secondary | ICD-10-CM | POA: Diagnosis present

## 2020-06-16 DIAGNOSIS — Z992 Dependence on renal dialysis: Secondary | ICD-10-CM

## 2020-06-16 DIAGNOSIS — Z6833 Body mass index (BMI) 33.0-33.9, adult: Secondary | ICD-10-CM

## 2020-06-16 DIAGNOSIS — Z91048 Other nonmedicinal substance allergy status: Secondary | ICD-10-CM

## 2020-06-16 DIAGNOSIS — N2581 Secondary hyperparathyroidism of renal origin: Secondary | ICD-10-CM | POA: Diagnosis present

## 2020-06-16 DIAGNOSIS — Z8249 Family history of ischemic heart disease and other diseases of the circulatory system: Secondary | ICD-10-CM

## 2020-06-16 DIAGNOSIS — Z8507 Personal history of malignant neoplasm of pancreas: Secondary | ICD-10-CM

## 2020-06-16 DIAGNOSIS — R110 Nausea: Secondary | ICD-10-CM | POA: Diagnosis present

## 2020-06-16 LAB — URINALYSIS, ROUTINE W REFLEX MICROSCOPIC
Bilirubin Urine: NEGATIVE
Glucose, UA: 150 mg/dL — AB
Ketones, ur: NEGATIVE mg/dL
Nitrite: NEGATIVE
Protein, ur: >=300 mg/dL — AB
Specific Gravity, Urine: 1.013 (ref 1.005–1.030)
pH: 8 (ref 5.0–8.0)

## 2020-06-16 LAB — I-STAT CHEM 8, ED
BUN: 73 mg/dL — ABNORMAL HIGH (ref 6–20)
Calcium, Ion: 1.01 mmol/L — ABNORMAL LOW (ref 1.15–1.40)
Chloride: 100 mmol/L (ref 98–111)
Creatinine, Ser: 11.1 mg/dL — ABNORMAL HIGH (ref 0.61–1.24)
Glucose, Bld: 121 mg/dL — ABNORMAL HIGH (ref 70–99)
HCT: 31 % — ABNORMAL LOW (ref 39.0–52.0)
Hemoglobin: 10.5 g/dL — ABNORMAL LOW (ref 13.0–17.0)
Potassium: 4.5 mmol/L (ref 3.5–5.1)
Sodium: 134 mmol/L — ABNORMAL LOW (ref 135–145)
TCO2: 24 mmol/L (ref 22–32)

## 2020-06-16 LAB — COMPREHENSIVE METABOLIC PANEL
ALT: 16 U/L (ref 0–44)
AST: 11 U/L — ABNORMAL LOW (ref 15–41)
Albumin: 3.6 g/dL (ref 3.5–5.0)
Alkaline Phosphatase: 63 U/L (ref 38–126)
Anion gap: 14 (ref 5–15)
BUN: 73 mg/dL — ABNORMAL HIGH (ref 6–20)
CO2: 24 mmol/L (ref 22–32)
Calcium: 8.9 mg/dL (ref 8.9–10.3)
Chloride: 96 mmol/L — ABNORMAL LOW (ref 98–111)
Creatinine, Ser: 10.85 mg/dL — ABNORMAL HIGH (ref 0.61–1.24)
GFR, Estimated: 5 mL/min — ABNORMAL LOW (ref >60)
Glucose, Bld: 126 mg/dL — ABNORMAL HIGH (ref 70–99)
Potassium: 4.4 mmol/L (ref 3.5–5.1)
Sodium: 134 mmol/L — ABNORMAL LOW (ref 135–145)
Total Bilirubin: 0.4 mg/dL (ref 0.3–1.2)
Total Protein: 7.2 g/dL (ref 6.5–8.1)

## 2020-06-16 LAB — AMMONIA: Ammonia: 36 umol/L — ABNORMAL HIGH (ref 9–35)

## 2020-06-16 LAB — CBC
HCT: 30.6 % — ABNORMAL LOW (ref 39.0–52.0)
Hemoglobin: 10.4 g/dL — ABNORMAL LOW (ref 13.0–17.0)
MCH: 30.4 pg (ref 26.0–34.0)
MCHC: 34 g/dL (ref 30.0–36.0)
MCV: 89.5 fL (ref 80.0–100.0)
Platelets: 221 10*3/uL (ref 150–400)
RBC: 3.42 MIL/uL — ABNORMAL LOW (ref 4.22–5.81)
RDW: 11.7 % (ref 11.5–15.5)
WBC: 6.1 10*3/uL (ref 4.0–10.5)
nRBC: 0 % (ref 0.0–0.2)

## 2020-06-16 LAB — CBG MONITORING, ED
Glucose-Capillary: 135 mg/dL — ABNORMAL HIGH (ref 70–99)
Glucose-Capillary: 104 mg/dL — ABNORMAL HIGH (ref 70–99)

## 2020-06-16 LAB — DIFFERENTIAL
Abs Immature Granulocytes: 0.01 10*3/uL (ref 0.00–0.07)
Basophils Absolute: 0 10*3/uL (ref 0.0–0.1)
Basophils Relative: 1 %
Eosinophils Absolute: 0.1 10*3/uL (ref 0.0–0.5)
Eosinophils Relative: 2 %
Immature Granulocytes: 0 %
Lymphocytes Relative: 9 %
Lymphs Abs: 0.5 10*3/uL — ABNORMAL LOW (ref 0.7–4.0)
Monocytes Absolute: 0.4 10*3/uL (ref 0.1–1.0)
Monocytes Relative: 6 %
Neutro Abs: 5 10*3/uL (ref 1.7–7.7)
Neutrophils Relative %: 82 %

## 2020-06-16 LAB — ETHANOL: Alcohol, Ethyl (B): <10 mg/dL (ref <10)

## 2020-06-16 LAB — RAPID URINE DRUG SCREEN, HOSP PERFORMED
Amphetamines: NOT DETECTED
Barbiturates: NOT DETECTED
Benzodiazepines: NOT DETECTED
Cocaine: NOT DETECTED
Opiates: NOT DETECTED
Tetrahydrocannabinol: NOT DETECTED

## 2020-06-16 LAB — RESP PANEL BY RT-PCR (FLU A&B, COVID) ARPGX2
Influenza A by PCR: NEGATIVE
Influenza B by PCR: NEGATIVE
SARS Coronavirus 2 by RT PCR: NEGATIVE

## 2020-06-16 LAB — APTT: aPTT: 31 seconds (ref 24–36)

## 2020-06-16 LAB — PROTIME-INR
INR: 1.2 (ref 0.8–1.2)
Prothrombin Time: 14.5 seconds (ref 11.4–15.2)

## 2020-06-16 LAB — GLUCOSE, CAPILLARY: Glucose-Capillary: 92 mg/dL (ref 70–99)

## 2020-06-16 MED ORDER — LORAZEPAM 2 MG/ML IJ SOLN
INTRAMUSCULAR | Status: AC
  Administered 2020-06-16: 1 mg via INTRAVENOUS
  Filled 2020-06-16: qty 1

## 2020-06-16 MED ORDER — HALOPERIDOL LACTATE 5 MG/ML IJ SOLN
5.0000 mg | Freq: Once | INTRAMUSCULAR | Status: AC
  Administered 2020-06-16: 5 mg via INTRAVENOUS
  Filled 2020-06-16: qty 1

## 2020-06-16 MED ORDER — ONDANSETRON HCL 4 MG/2ML IJ SOLN
INTRAMUSCULAR | Status: AC
  Administered 2020-06-16: 4 mg via INTRAVENOUS
  Filled 2020-06-16: qty 2

## 2020-06-16 MED ORDER — IOHEXOL 350 MG/ML SOLN
75.0000 mL | Freq: Once | INTRAVENOUS | Status: AC | PRN
  Administered 2020-06-16: 75 mL via INTRAVENOUS

## 2020-06-16 MED ORDER — HYDRALAZINE HCL 20 MG/ML IJ SOLN: 20.0000 mg | INTRAMUSCULAR | Status: DC | PRN

## 2020-06-16 MED ORDER — SODIUM CHLORIDE 0.9% FLUSH
3.0000 mL | Freq: Two times a day (BID) | INTRAVENOUS | Status: DC
  Administered 2020-06-16 – 2020-06-19 (×6): 3 mL via INTRAVENOUS

## 2020-06-16 MED ORDER — HEPARIN SODIUM (PORCINE) 5000 UNIT/ML IJ SOLN
5000.0000 [IU] | Freq: Three times a day (TID) | INTRAMUSCULAR | Status: DC
  Administered 2020-06-16 – 2020-06-19 (×8): 5000 [IU] via SUBCUTANEOUS
  Filled 2020-06-16 (×8): qty 1

## 2020-06-16 MED ORDER — LABETALOL HCL 5 MG/ML IV SOLN
10.0000 mg | Freq: Once | INTRAVENOUS | Status: AC
  Administered 2020-06-16: 10 mg via INTRAVENOUS
  Filled 2020-06-16: qty 4

## 2020-06-16 MED ORDER — ACETAMINOPHEN 650 MG RE SUPP
650.0000 mg | Freq: Four times a day (QID) | RECTAL | Status: DC | PRN
  Administered 2020-06-16: 650 mg via RECTAL
  Filled 2020-06-16: qty 1

## 2020-06-16 MED ORDER — ASPIRIN EC 81 MG PO TBEC
81.0000 mg | DELAYED_RELEASE_TABLET | Freq: Every day | ORAL | Status: DC
  Administered 2020-06-18 – 2020-06-19 (×2): 81 mg via ORAL
  Filled 2020-06-16 (×2): qty 1

## 2020-06-16 MED ORDER — ALBUTEROL SULFATE (2.5 MG/3ML) 0.083% IN NEBU: 2.5000 mg | INHALATION_SOLUTION | Freq: Four times a day (QID) | RESPIRATORY_TRACT | Status: DC | PRN

## 2020-06-16 MED ORDER — LORAZEPAM 2 MG/ML IJ SOLN
2.0000 mg | Freq: Four times a day (QID) | INTRAMUSCULAR | Status: DC | PRN
  Administered 2020-06-16 – 2020-06-17 (×2): 2 mg via INTRAVENOUS
  Filled 2020-06-16 (×3): qty 1

## 2020-06-16 MED ORDER — INSULIN ASPART 100 UNIT/ML ~~LOC~~ SOLN
0.0000 [IU] | Freq: Three times a day (TID) | SUBCUTANEOUS | Status: DC
  Administered 2020-06-18: 1 [IU] via SUBCUTANEOUS
  Administered 2020-06-18: 2 [IU] via SUBCUTANEOUS

## 2020-06-16 MED ORDER — HALOPERIDOL LACTATE 5 MG/ML IJ SOLN
5.0000 mg | Freq: Four times a day (QID) | INTRAMUSCULAR | Status: DC | PRN
  Administered 2020-06-17: 5 mg via INTRAVENOUS
  Filled 2020-06-16 (×2): qty 1

## 2020-06-16 MED ORDER — FENTANYL CITRATE (PF) 100 MCG/2ML IJ SOLN
INTRAMUSCULAR | Status: AC
  Administered 2020-06-16: 50 ug via INTRAVENOUS
  Filled 2020-06-16: qty 2

## 2020-06-16 MED ORDER — LABETALOL HCL 5 MG/ML IV SOLN
10.0000 mg | INTRAVENOUS | Status: DC | PRN
  Administered 2020-06-16: 10 mg via INTRAVENOUS
  Filled 2020-06-16: qty 4

## 2020-06-16 MED ORDER — HYDRALAZINE HCL 20 MG/ML IJ SOLN
10.0000 mg | INTRAMUSCULAR | Status: DC | PRN
  Administered 2020-06-16: 10 mg via INTRAVENOUS
  Filled 2020-06-16: qty 1

## 2020-06-16 MED ORDER — FENTANYL CITRATE (PF) 100 MCG/2ML IJ SOLN: 50.0000 ug | Freq: Once | INTRAMUSCULAR | Status: AC

## 2020-06-16 MED ORDER — INSULIN ASPART 100 UNIT/ML ~~LOC~~ SOLN
0.0000 [IU] | Freq: Every day | SUBCUTANEOUS | Status: DC
  Administered 2020-06-18: 3 [IU] via SUBCUTANEOUS

## 2020-06-16 MED ORDER — ONDANSETRON HCL 4 MG/2ML IJ SOLN: 4.0000 mg | Freq: Once | INTRAMUSCULAR | Status: AC

## 2020-06-16 MED ORDER — ACETAMINOPHEN 325 MG PO TABS
650.0000 mg | ORAL_TABLET | Freq: Four times a day (QID) | ORAL | Status: DC | PRN
  Administered 2020-06-17: 650 mg via ORAL

## 2020-06-16 MED ORDER — LORAZEPAM 2 MG/ML IJ SOLN
1.0000 mg | Freq: Once | INTRAMUSCULAR | Status: AC
  Administered 2020-06-16: 1 mg via INTRAVENOUS

## 2020-06-16 MED ORDER — LORAZEPAM 2 MG/ML IJ SOLN: 1.0000 mg | Freq: Once | INTRAMUSCULAR | Status: AC

## 2020-06-16 MED ORDER — LORAZEPAM 2 MG/ML IJ SOLN
INTRAMUSCULAR | Status: AC
  Filled 2020-06-16: qty 1

## 2020-06-16 MED ORDER — MORPHINE SULFATE (PF) 2 MG/ML IV SOLN
2.0000 mg | INTRAVENOUS | Status: DC | PRN
  Filled 2020-06-16: qty 1

## 2020-06-16 NOTE — Progress Notes (Signed)
Left forearm graft de accessed, no active bleeding noted. Pressure dressing applied for 15 minutes. Primary RN made aware.

## 2020-06-16 NOTE — ED Triage Notes (Signed)
Pt here via EMS from [facility] for acute mental status change after 1st completed hour of dialysis.  Per Center, this has been happening for over 1 month and pt has been sent to [facility] for same.  However, today it was worse.  Staff stated that pt walked in normally (norm over the last 3 months) at 0956.  LSN was 10:30, then at 11:00 pt became pale, diaphoretic, nauseated, and was unable to answer any questions.  His bp was 140/82 at that time.  Pt also c/o bil hand numbness and back pain.    Pt unable to answer any questions ed, though he can follow some commands.  Fistula still accessed.  Requested IV team to de-access.  174/105 76 hr cbg 183 rr 18 o2 98% RA

## 2020-06-16 NOTE — ED Notes (Signed)
Pt becoming increasingly agitated.  Attempting to get out of bed and pull lines and remove wires.

## 2020-06-16 NOTE — Progress Notes (Signed)
Consulted to de access left forearm graft. Patient is going to CT, RN to re enter IV consult when patient done in CT. RN placed Kerlix in patient left forearm as safety measure. Will follow up.

## 2020-06-16 NOTE — Procedures (Signed)
Patient Name: Scott Castillo  MRN: [id number]  Epilepsy Attending: Lora Havens  Referring Physician/Provider: Dr Meredith Staggers Dagar Date: 06/16/2020 Duration: 22.42 mins  Patient history: 60 y.o. male with PMHx of HLD on CKD, ESRD, diabetes mellitus type 2, hyperlipidemia, anemia of chronic disease, venous stasis dermatitis of both lower extremities presented with expressive aphasia and altered mental status post hemodialysis. EEG to evaluate for seizure  Level of alertness: awake  AEDs during EEG study: None  Technical aspects: This EEG study was done with scalp electrodes positioned according to the 10-20 International system of electrode placement. Electrical activity was acquired at a sampling rate of 500Hz  and reviewed with a high frequency filter of 70Hz  and a low frequency filter of 1Hz . EEG data were recorded continuously and digitally stored.   Description: No posterior dominant rhythm was seen.  EEG showed continuous generalized 3 to 6 Hz theta-delta slowing. Hyperventilation and photic stimulation were not performed.     ABNORMALITY -Continuous slow, generalized  IMPRESSION: This study is suggestive of moderate diffuse encephalopathy, nonspecific etiology. No seizures or epileptiform discharges were seen throughout the recording.  Scott Castillo [name]

## 2020-06-16 NOTE — ED Notes (Signed)
Attempted report 

## 2020-06-16 NOTE — Consult Note (Signed)
Referring Physician: Dr. Charlesetta Shanks    Chief Complaint: AMS, Aphasia  HPI: Scott Castillo is an 60 y.o. male with PMHx of acute renal failure on CKD (On HD MWF) , ESRD, diabetes mellitus type 2, hyperlipidemia, anemia of chronic disease, venous stasis dermatitis of both lower extremities, pancreatic cancer, secondary hyperparathyroidism presented to [facility] with altered mental status Global aphasia.  Walked into dialysis clinic today at 1030.  After noting to dialysis he became pale, complains of nausea and back pain and back pain and then he had trouble getting his words out. As per wife, this has been happening repeatedly to the patient after each dialysis.  He has similar presentation multiple times where after few hours into dialysis he becomes pale, nauseous, altered mental status, numbness in arms,  not able to speak and it persists for ~5 hours and then he returns to his baseline.  LSN: Unknown Ofnote: Wife reports seeing him normal at 6 AM this morning, 10:30 AM this morning he walked into dialysis center and around 11 AM started the symptoms as per dialysis center.  But, we are not sure that it was onset of symptoms or continuation of symptoms from may be Friday HD.  Also, his similar presentation after each episode of hemodialysis is a bit confusing.  tPA Given: No, outside the window.  NIHSS: 1A Level of Consciousness: +2 1B Both questions : +2 1C Both tasks : +2 2 Horizontal extraocular movements :  0 3 Visual fields: 0 4 Facial palsy: +1 5A Left arm motor drift:0 5B Right arm motor drift: +1 6A Left leg motor drift:0 6B Right leg motor drift:+2 7 Limb ataxia:0 8 Sensation:0 9 Language/ aphasia: +2 10 Dysarthria :0 11 Extinction/inattention: +1 Total: 13  Past Medical History:  Diagnosis Date  . Venous stasis dermatitis of both lower extremities     Past Surgical History:  Procedure Laterality Date  . A/V FISTULAGRAM Left 09/27/2019   Procedure: A/V FISTULAGRAM;   Surgeon: Marty Heck, MD;  Location: [facility];  Service: Cardiovascular;  Laterality: Left;  . AV FISTULA PLACEMENT Left 09/17/2016   Procedure: ARTERIOVENOUS (AV) FISTULA CREATION;  Surgeon: [name], MD;  Location: [facility];  Service: Vascular;  Laterality: Left;  . ENDOVENOUS ABLATION SAPHENOUS VEIN W/ LASER Right 04/08/2016   EVLA R greater saphenous vein by Curt Jews MD  . ENDOVENOUS ABLATION SAPHENOUS VEIN W/ LASER Left 05/13/2016   endovenous laser ablation (left greater saphenous vein) by Curt Jews MD     Family History  Problem Relation Age of Onset  . Heart attack Father    Social History:  reports that he has never smoked. He has never used smokeless tobacco. He reports that he does not drink alcohol and does not use drugs.  Allergies:  Allergies  Allergen Reactions  . Pioglitazone Swelling  . Adhesive [Tape] Other (See Comments)    Causes redness, pt prefers paper tape   . Other Other (See Comments)    Causes redness, pt prefers paper tape     Medications:  Current Outpatient Medications  Medication Instructions  . AURYXIA 1 GM 210 MG(Fe) tablet TAKE 2 TABLETS BY MOUTH THREE TIMES A DAY WITH MEALS AND 1 TABLET WITH SNACKS  . carvedilol (COREG) 25 mg, Oral, 2 times daily with meals  . insulin degludec (TRESIBA FLEXTOUCH) 10 Units, Subcutaneous, BH-each morning  . Insulin Pen Needle (BD ULTRA-FINE PEN NEEDLES) 29G X 12.7MM MISC USE AS DIRECTED EVERY DAY  .  irbesartan (AVAPRO) 150 MG tablet irbesartan tablet  . neomycin-bacitracin-polymyxin (NEOSPORIN) ointment 1 application, Topical, Daily PRN, apply to eye  . pravastatin (PRAVACHOL) 80 mg, Oral, Every evening  . Semaglutide (RYBELSUS) 14 MG TABS TAKE 1 TABLET (14 MG TOTAL) BY MOUTH EVERY MORNING. DIABETES  . VITAMIN D PO Oral    ROS: Unable to perform due to altered mental status.  Physical Examination: Blood pressure (!) 201/116, pulse 95, resp. rate 18, SpO2 95 %. General: Agitated middle  age men, in moderate distress. HEENT: [place]/AT, MMM Cardiovascular: Regular rate and rhythm Respiratory: No respiratory distress Psych: Anxious mood, agitated Skin: Warm and dry, chronic venous stasis bilaterally lower extremities.  Neurologic Examination: Mental Status: Patient is globally aphasic, unable to check orientation, follows few commands.Kept on repeating "help me", earlier unclear but later on clearly. Cranial Nerves: Unable to check visual fields but pupils equal, round, reactive to light and accommodation. Able to cross midline bilaterally. Ptosis not present, extraocular motion intact bilaterally.  Right facial droop. Motor: Able to keep left leg elevated for 5 seconds without drift, unable to move right leg.  Able to keep left arm elevated for 10 seconds but drift noted in right arm without hitting the bed. Tone and bulk:normal tone throughout; no atrophy  Sensory: Pinprick  intact throughout, bilaterally Plantars: Going downward bilaterally Unable to check cerebellar/gait.  Imaging CT Head Code Stroke 06/16/2020 IMPRESSION: 1. Small right insular hypodensity may reflect age indeterminate lacunar insult. Motion degraded exam. 2. Mild cerebral atrophy. 3. ASPECTS is 9  CT Angio Head Neck w or wo contrast 06/16/2020 IMPRESSION: CTA neck:  Common carotid, internal carotid and vertebral arteries patent within the neck without hemodynamically significant stenosis. Mild atherosclerotic disease within the carotid systems as described.  CTA head:  1. No intracranial large vessel occlusion or proximal high-grade arterial stenosis. 2. Nonstenotic calcified plaque within the intracranial internal carotid arteries and V4 vertebral arteries bilaterally.   Assessment: 60 y.o. male with PMHx of HLD on CKD, ESRD, diabetes mellitus type 2, hyperlipidemia, anemia of chronic disease, venous stasis dermatitis of both lower extremities presented with expressive aphasia and altered  mental status post hemodialysis. -As per wife, patient has had these episodes of expressive aphasia and confusion at which time after his dialysis session he takes around 5 hours for him to return to his baseline. -On presentation BP 182/112, RR 10, HR 85, blood glucose 121, creatinine 11.10, BUN 73. -Initial evaluation showed globally aphasic patient, mild right facial droop, right arm weakness, right leg weakness.  CT head code stroke, CTA head and neck were ordered immediately.  Patient was extremely agitated and restless and received phenytoin, Ativan to obtain the brain scans.  Patient is not a candidate for tPA due to unclear last known normal.  -CT head shows small right insular hypodensity, indeterminate lacunar insult.  CTA does not show any large vessel occlusion and that's why patient is not a candidate for thrombectomy as well. -Transient hypoperfusion might be leading this is repetitive episodes post hemodialysis. It can be due to posterior reversible encephalopathic syndrome, as patient has persistent hypertension. It can present as acute encephalopathy, headache, focal neurological deficits. -To rule out seizures EEG has been ordered -Also, considering possibility of disequilibrium syndrome (DDS) as it is a rare but serious complication of hemodialysis.  It is usually producing neurological symptoms such as headaches, nausea, altered mental status, convulsions.  Impression: Stroke vs seizure vs posterior reversible encephalopathic syndrome (PRES) vs disequilibrium syndrome (DDS)  Stroke Risk  Factors - diabetes mellitus, hyperlipidemia and hypertension  Plan: 1. HgbA1c, fasting lipid panel 2. Follow MR brain 3. PT consult, OT consult, Speech consult 4. Echocardiogram 5. Follow up EEG 6. Aspirin 81 mg daily 7. Risk factor modification 8. Telemetry monitoring 9. Frequent neuro checks 10. Seizures precautions 11. Neurology will be following along.  Honor Junes, MD PGY-1,  Resident 06/16/2020, 4:34 PM

## 2020-06-16 NOTE — ED Notes (Signed)
No improvement of bp with labetalol.  Dr Tamala Julian notified.

## 2020-06-16 NOTE — H&P (Signed)
History and Physical    Scott Castillo PPI:[id number] DOB: [birth year] DOA: 06/16/2020  Referring MD/NP/PA: Tomasa Rand, MD PCP: Algis Greenhouse, MD  Patient coming from: HD Via EMS  Chief Complaint: Altered mental status  I have personally briefly reviewed patient's old medical records in [place]   HPI: Scott Castillo is a 60 y.o. male with medical history significant of ESRD on HD(MWF), DM type II, anemia of chronic disease presents with after being noted to be acutely altered during his hemodialysis session today.  His wife helps provide additional history.  Apparently patient has been on dialysis for 3 years now, but over the last month he has been dealing with issues with a pinched nerve in his neck.  Over the last month patient has had several episodes during dialysis.  Apparently he gets pale, nauseous, complains of back/neck pain, numbness in his arms then legs, and subsequently developed slurred speech.  Today he was able to drive himself to hemodialysis today at 10 AM.  However around 10:30 AM he  noted to be significantly confused and was unable to talk.  Wife states that he is not even able to recognize her and everything that he says does not make sense.  Symptoms had previously resolved after few hours, but for some reason symptoms persisted longer today.  In the past he had also been able to tell her what was going on normally he is supposed to dialyze 4 hours, but here recently since the symptoms started he has not been able to dialyze more than 2 hours.  ED Course: Upon admission into the emergency department patient was seen to have heart rates 86 107, blood pressure elevated up to 233/104, and all other vital signs maintained. He was evaluated by neurology. CT/CTA of the head neck did not show any large vessel occlusion, but did note a small right insular hypodensity of indeterminate age.  Patient was not a TPA candidate as his last known normal was not clear.Patient was  noted to show some right sided weakness. Labs significant for hemoglobin 10.4, sodium 134, potassium 4.4, BUN 73, creatinine 10.85, glucose 126, and ammonia level 36.  He was noted to be yelling and screaming during his time in the emergency department.  He had been given Ativan multiple times, Haldol, labetalol IV, and fentanyl 50 mcg IV.  Review of Systems  Unable to perform ROS: Mental status change    Past Medical History:  Diagnosis Date  . Venous stasis dermatitis of both lower extremities     Past Surgical History:  Procedure Laterality Date  . A/V FISTULAGRAM Left 09/27/2019   Procedure: A/V FISTULAGRAM;  Surgeon: Marty Heck, MD;  Location: [facility];  Service: Cardiovascular;  Laterality: Left;  . AV FISTULA PLACEMENT Left 09/17/2016   Procedure: ARTERIOVENOUS (AV) FISTULA CREATION;  Surgeon: [name], MD;  Location: [facility];  Service: Vascular;  Laterality: Left;  . ENDOVENOUS ABLATION SAPHENOUS VEIN W/ LASER Right 04/08/2016   EVLA R greater saphenous vein by Curt Jews MD  . ENDOVENOUS ABLATION SAPHENOUS VEIN W/ LASER Left 05/13/2016   endovenous laser ablation (left greater saphenous vein) by Curt Jews MD      reports that he has never smoked. He has never used smokeless tobacco. He reports that he does not drink alcohol and does not use drugs.  Allergies  Allergen Reactions  . Pioglitazone Swelling  . Adhesive [Tape] Other (See Comments)    Causes redness, pt  prefers paper tape   . Other Other (See Comments)    Causes redness, pt prefers paper tape     Family History  Problem Relation Age of Onset  . Heart attack Father     Prior to Admission medications   Medication Sig Start Date End Date Taking? Authorizing Provider  AURYXIA 1 GM 210 MG(Fe) tablet TAKE 2 TABLETS BY MOUTH THREE TIMES A DAY WITH MEALS AND 1 TABLET WITH SNACKS 08/17/19   [provider]  carvedilol () 25 MG tablet Take 25 mg by mouth 2 (two) times daily with a meal.   07/28/15   [provider]  Insulin Degludec (TRESIBA FLEXTOUCH) 200 UNIT/ML SOPN Inject 10 Units into the skin every morning. 07/06/16   Reyne Dumas, MD  Insulin Pen Needle (BD ULTRA-FINE PEN NEEDLES) 29G X 12.7MM MISC USE AS DIRECTED EVERY DAY 09/22/19   [provider]  irbesartan (AVAPRO) 150 MG tablet irbesartan tablet 09/05/19   [provider]  neomycin-bacitracin-polymyxin (NEOSPORIN) ointment Apply 1 application topically daily as needed for wound care. apply to eye    [provider]  pravastatin (PRAVACHOL) 80 MG tablet Take 80 mg by mouth every evening.     [provider]  Semaglutide (RYBELSUS) 14 MG TABS TAKE 1 TABLET (14 MG TOTAL) BY MOUTH EVERY MORNING. DIABETES 10/05/18   [provider]  VITAMIN D PO Take by mouth. 08/22/19 08/20/20  [provider]    Physical Exam:  Constitutional: Middle-age male who is currently altered and unable to answer questions.  Intermittently yelling out help me. Vitals:   06/16/20 1515 06/16/20 1530 06/16/20 1545 06/16/20 1615  BP: (!) 208/99 (!) 233/104 (!) 201/116 (!) 212/128  Pulse: 93 (!) 101 95 (!) 107  Resp: 18     SpO2: 96% 97% 95% 93%   Eyes: PERRL, lids and conjunctivae normal ENMT: Mucous membranes are dry. Posterior pharynx clear of any exudate or lesions.  Neck: normal, supple, no masses, no thyromegaly Respiratory: clear to auscultation bilaterally, no wheezing, no crackles. Normal respiratory effort. No accessory muscle use.  Cardiovascular: Regular rate and rhythm, no murmurs / rubs / gallops. No extremity edema. 2+ pedal pulses. No carotid bruits.  Abdomen: no tenderness, no masses palpated. No hepatosplenomegaly. Bowel sounds positive.  Musculoskeletal: no clubbing / cyanosis. No joint deformity upper and lower extremities. Good ROM, no contractures. Normal muscle tone.  Skin: Bruising noted of the bilateral upper extremities from the patient thrashing about in  bed Neurologic: CN 2-12 grossly intact.  Patient moving all extremities. Psychiatric: Agitated mood    Labs on Admission: I have personally reviewed following labs and imaging studies  CBC: Recent Labs  Lab 06/16/20 1359 06/16/20 1420  WBC 6.1  --   NEUTROABS 5.0  --   HGB 10.4* 10.5*  HCT 30.6* 31.0*  MCV 89.5  --   PLT 221  --    Basic Metabolic Panel: Recent Labs  Lab 06/16/20 1359 06/16/20 1420  NA 134* 134*  K 4.4 4.5  CL 96* 100  CO2 24  --   GLUCOSE 126* 121*  BUN 73* 73*  CREATININE 10.85* 11.10*  CALCIUM 8.9  --    GFR: CrCl cannot be calculated (Unknown ideal weight.). Liver Function Tests: Recent Labs  Lab 06/16/20 1359  AST 11*  ALT 16  ALKPHOS 63  BILITOT 0.4  PROT 7.2  ALBUMIN 3.6   No results for input(s): LIPASE, AMYLASE in the last 168 hours. Recent Labs  Lab 06/16/20 1400  AMMONIA 36*   Coagulation Profile: Recent Labs  Lab 06/16/20 1359  INR 1.2   Cardiac Enzymes: No results for input(s): CKTOTAL, CKMB, CKMBINDEX, TROPONINI in the last 168 hours. BNP (last 3 results) No results for input(s): PROBNP in the last 8760 hours. HbA1C: No results for input(s): HGBA1C in the last 72 hours. CBG: Recent Labs  Lab 06/16/20 1250 06/16/20 1606  GLUCAP 135* 104*   Lipid Profile: No results for input(s): CHOL, HDL, LDLCALC, TRIG, CHOLHDL, LDLDIRECT in the last 72 hours. Thyroid Function Tests: No results for input(s): TSH, T4TOTAL, FREET4, T3FREE, THYROIDAB in the last 72 hours. Anemia Panel: No results for input(s): VITAMINB12, FOLATE, FERRITIN, TIBC, IRON, RETICCTPCT in the last 72 hours. Urine analysis:    Component Value Date/Time   COLORURINE YELLOW 06/16/2020 [place] 06/16/2020 1244   LABSPEC 1.013 06/16/2020 1244   PHURINE 8.0 06/16/2020 1244   GLUCOSEU 150 (A) 06/16/2020 1244   HGBUR SMALL (A) 06/16/2020 [place] NEGATIVE 06/16/2020 [place] 06/16/2020 1244   PROTEINUR  >=300 (A) 06/16/2020 1244   NITRITE NEGATIVE 06/16/2020 1244   LEUKOCYTESUR TRACE (A) 06/16/2020 1244   Sepsis Labs: Recent Results (from the past 240 hour(s))  Resp Panel by RT-PCR (Flu A&B, Covid) Nasopharyngeal Swab     Status: None   Collection Time: 06/16/20 12:44 PM   Specimen: Nasopharyngeal Swab; Nasopharyngeal(NP) swabs in vial transport medium  Result Value Ref Range Status   SARS Coronavirus 2 by RT PCR NEGATIVE NEGATIVE Final    Comment: (NOTE) SARS-CoV-2 target nucleic acids are NOT DETECTED.  The SARS-CoV-2 RNA is generally detectable in upper respiratory specimens during the acute phase of infection. The lowest concentration of SARS-CoV-2 viral copies this assay can detect is 138 copies/mL. A negative result does not preclude SARS-Cov-2 infection and should not be used as the sole basis for treatment or other patient management decisions. A negative result may occur with  improper specimen collection/handling, submission of specimen other than nasopharyngeal swab, presence of viral mutation(s) within the areas targeted by this assay, and inadequate number of viral copies(<138 copies/mL). A negative result must be combined with clinical observations, patient history, and epidemiological information. The expected result is Negative.  Fact Sheet for Patients:  EntrepreneurPulse.com.au  Fact Sheet for Healthcare Providers:  IncredibleEmployment.be  This test is no t yet approved or cleared by the [place] FDA and  has been authorized for detection and/or diagnosis of SARS-CoV-2 by FDA under an Emergency Use Authorization (EUA). This EUA will remain  in effect (meaning this test can be used) for the duration of the COVID-19 declaration under Section 564(b)(1) of the Act, 21 U.S.C.section 360bbb-3(b)(1), unless the authorization is terminated  or revoked sooner.       Influenza A by PCR NEGATIVE NEGATIVE Final   Influenza B  by PCR NEGATIVE NEGATIVE Final    Comment: (NOTE) The Xpert Xpress SARS-CoV-2/FLU/RSV plus assay is intended as an aid in the diagnosis of influenza from Nasopharyngeal swab specimens and should not be used as a sole basis for treatment. Nasal washings and aspirates are unacceptable for Xpert Xpress SARS-CoV-2/FLU/RSV testing.  Fact Sheet for Patients: EntrepreneurPulse.com.au  Fact Sheet for Healthcare Providers: IncredibleEmployment.be  This test is not yet approved or cleared by the [place] FDA and has been authorized for detection and/or diagnosis of SARS-CoV-2 by FDA under an Emergency Use Authorization (EUA). This EUA will remain in effect (meaning this test  can be used) for the duration of the COVID-19 declaration under Section 564(b)(1) of the Act, 21 U.S.C. section 360bbb-3(b)(1), unless the authorization is terminated or revoked.  Performed at [facility], [place] [address], [place],  [address]      Radiological Exams on Admission: CT Code Stroke CTA Head W/WO contrast  Result Date: 06/16/2020 CLINICAL DATA:  Focal neuro deficit, greater than 6 hours, stroke suspected. Aphasia, altered mental status EXAM: CT ANGIOGRAPHY HEAD AND NECK TECHNIQUE: Multidetector CT imaging of the head and neck was performed using the standard protocol during bolus administration of intravenous contrast. Multiplanar CT image reconstructions and MIPs were obtained to evaluate the vascular anatomy. Carotid stenosis measurements (when applicable) are obtained utilizing NASCET criteria, using the distal internal carotid diameter as the denominator. CONTRAST:  20mL OMNIPAQUE IOHEXOL 350 MG/ML SOLN COMPARISON:  Noncontrast head CT performed earlier today 06/16/2020. MRI/MRA head 05/03/2020. MRA neck 05/03/2020. FINDINGS: CTA NECK FINDINGS Aortic arch: Standard aortic branching. The visualized aortic arch is unremarkable. Minimal atherosclerotic plaque  within the proximal left subclavian artery. No hemodynamically significant innominate or proximal subclavian artery stenosis. Right carotid system: CCA and ICA patent within the neck without stenosis. Minimal atherosclerotic plaque within the carotid bifurcation. Tortuosity of the cervical ICA. Left carotid system: CCA and ICA patent within the neck without stenosis. Mild predominantly soft plaque within the mid to distal ICA. Vertebral arteries: Codominant and patent within the neck without stenosis. Skeleton: No acute bony abnormality or aggressive osseous lesion. Cervical spondylosis with multilevel disc space narrowing, disc bulges, posterior disc osteophytes, uncovertebral hypertrophy. Disc space narrowing is greatest at C5-C6 and C6-C7 (advanced at these levels). Other neck: No neck mass or cervical lymphadenopathy. Upper chest: No consolidation within the imaged lung apices. Review of the MIP images confirms the above findings CTA HEAD FINDINGS Anterior circulation: The intracranial internal carotid arteries are patent. Nonstenotic calcified plaque within these vessels bilaterally. The M1 middle cerebral arteries are patent. No M2 proximal branch occlusion or high-grade proximal stenosis is identified. The anterior cerebral arteries are patent. No intracranial aneurysm is identified. Posterior circulation: The intracranial vertebral arteries are patent. Mild nonstenotic calcified plaque within both V4 segments. The basilar artery is patent. The posterior cerebral arteries are patent. A right posterior communicating artery is present. The left posterior communicating artery is hypoplastic or absent. Venous sinuses: Within the limitations of contrast timing, no convincing thrombus. Anatomic variants: As described Review of the MIP images confirms the above findings No large vessel occlusion. These results were communicated to Dr. Leonel Ramsay at 1:50 pmon 2/28/2022by text page via the [facility] messaging system.  IMPRESSION: CTA neck: Common carotid, internal carotid and vertebral arteries patent within the neck without hemodynamically significant stenosis. Mild atherosclerotic disease within the carotid systems as described. CTA head: 1. No intracranial large vessel occlusion or proximal high-grade arterial stenosis. 2. Nonstenotic calcified plaque within the intracranial internal carotid arteries and V4 vertebral arteries bilaterally. Electronically Signed   By: Kellie Simmering DO   On: 06/16/2020 13:52   CT Code Stroke CTA Neck W/WO contrast  Result Date: 06/16/2020 CLINICAL DATA:  Focal neuro deficit, greater than 6 hours, stroke suspected. Aphasia, altered mental status EXAM: CT ANGIOGRAPHY HEAD AND NECK TECHNIQUE: Multidetector CT imaging of the head and neck was performed using the standard protocol during bolus administration of intravenous contrast. Multiplanar CT image reconstructions and MIPs were obtained to evaluate the vascular anatomy. Carotid stenosis measurements (when applicable) are obtained utilizing NASCET criteria, using the distal  internal carotid diameter as the denominator. CONTRAST:  35mL OMNIPAQUE IOHEXOL 350 MG/ML SOLN COMPARISON:  Noncontrast head CT performed earlier today 06/16/2020. MRI/MRA head 05/03/2020. MRA neck 05/03/2020. FINDINGS: CTA NECK FINDINGS Aortic arch: Standard aortic branching. The visualized aortic arch is unremarkable. Minimal atherosclerotic plaque within the proximal left subclavian artery. No hemodynamically significant innominate or proximal subclavian artery stenosis. Right carotid system: CCA and ICA patent within the neck without stenosis. Minimal atherosclerotic plaque within the carotid bifurcation. Tortuosity of the cervical ICA. Left carotid system: CCA and ICA patent within the neck without stenosis. Mild predominantly soft plaque within the mid to distal ICA. Vertebral arteries: Codominant and patent within the neck without stenosis. Skeleton: No acute bony  abnormality or aggressive osseous lesion. Cervical spondylosis with multilevel disc space narrowing, disc bulges, posterior disc osteophytes, uncovertebral hypertrophy. Disc space narrowing is greatest at C5-C6 and C6-C7 (advanced at these levels). Other neck: No neck mass or cervical lymphadenopathy. Upper chest: No consolidation within the imaged lung apices. Review of the MIP images confirms the above findings CTA HEAD FINDINGS Anterior circulation: The intracranial internal carotid arteries are patent. Nonstenotic calcified plaque within these vessels bilaterally. The M1 middle cerebral arteries are patent. No M2 proximal branch occlusion or high-grade proximal stenosis is identified. The anterior cerebral arteries are patent. No intracranial aneurysm is identified. Posterior circulation: The intracranial vertebral arteries are patent. Mild nonstenotic calcified plaque within both V4 segments. The basilar artery is patent. The posterior cerebral arteries are patent. A right posterior communicating artery is present. The left posterior communicating artery is hypoplastic or absent. Venous sinuses: Within the limitations of contrast timing, no convincing thrombus. Anatomic variants: As described Review of the MIP images confirms the above findings No large vessel occlusion. These results were communicated to Dr. Leonel Ramsay at 1:50 pmon 2/28/2022by text page via the [facility] messaging system. IMPRESSION: CTA neck: Common carotid, internal carotid and vertebral arteries patent within the neck without hemodynamically significant stenosis. Mild atherosclerotic disease within the carotid systems as described. CTA head: 1. No intracranial large vessel occlusion or proximal high-grade arterial stenosis. 2. Nonstenotic calcified plaque within the intracranial internal carotid arteries and V4 vertebral arteries bilaterally. Electronically Signed   By: Kellie Simmering DO   On: 06/16/2020 13:52   CT HEAD CODE STROKE WO  CONTRAST  Result Date: 06/16/2020 CLINICAL DATA:  Code stroke.  Neuro deficit, acute, stroke suspected EXAM: CT HEAD WITHOUT CONTRAST TECHNIQUE: Contiguous axial images were obtained from the base of the skull through the vertex without intravenous contrast. COMPARISON:  05/09/2020 and prior. FINDINGS: Please note that image quality is degraded by motion artifact. Brain: No acute infarct or intracranial hemorrhage. No mass lesion. No midline shift, ventriculomegaly or extra-axial fluid collection. Mild cerebral atrophy with ex vacuo dilatation. Small right insular hypodensity. Vascular: No hyperdense vessel or unexpected calcification. Bilateral skull base atherosclerotic calcifications. Skull: Negative for fracture or focal lesion. Sinuses/Orbits: No acute finding. Other: None. ASPECTS [facility] Stroke Program Early CT Score) - Ganglionic level infarction (caudate, lentiform nuclei, internal capsule, insula, M1-M3 cortex): 6 - Supraganglionic infarction (M4-M6 cortex): 3 Total score (0-10 with 10 being normal): 9 IMPRESSION: 1. Small right insular hypodensity may reflect age indeterminate lacunar insult versus prominent sulcus. Motion degraded exam. 2. Mild cerebral atrophy. 3. ASPECTS is 9 4. Code stroke imaging results were communicated on 06/16/2020 at 1:18 pm to provider Dr. Leonel Ramsay via secure text paging. Electronically Signed   By: Primitivo Gauze M.D.   On: 06/16/2020 13:21  EKG: Independently reviewed.  Sinus rhythm at 68 bpm with minimal ST  Assessment/Plan Expressive aphasia acute metabolic encephalopathy: Patient presents with these repeated episodes of being altered with expressive aphasia. Neurology notedsome mide right hemiparesis.   Ammonia level 36, but not elevated significantly to suggest hepatic encephalopathy as cause of patient's symptoms.  CT/CTA of the head and neck did not note any large vessel occlusion, but did note a small right insular hypodensity concerning for  indeterminate lacunar infarct versus prominent sulcus.  Neurology was consulted and question if symptoms were secondary to a  PRES vs. disequilibrium syndrome vs. stroke vs. seizure -Admit to a progressive bed -Seizure precautions -Neurochecks -N.p.o. -Soft restraints -Check EEG  -Check MRI of the brain -Check echocardiogram -Haldol 5 mg IV as needed for agitation -Aspirin -PT/OT/speech to evaluate and treat -Appreciate neurology consultative services, we will follow for further recommendation  Hypertensive emergency: Acute.  Blood pressures elevated up to 233/104. -Goal blood pressure less than 180 -Hydralazine IV/labetalol IV as needed systolic blood pressure greater than 180  ESRD on HD: Patient normally dialyzes Monday, Wednesday, Friday.  Labs significant for potassium 4.4, BUN 73, and creatinine 10.85. only received about 2 hours of his HD treatment today. -Nephrology consulted  Diabetes Mellitus type 2 -Hypoglycemic protocols -CBGs qAC and HS with Sensitive SSI - Adjust regimen as needed  Anemia of chronic disease: Hemoglobin 10.5 g/dL which appears near patient's baseline. -Continue to monitor  DVT prophylaxis: Heparin Code Status: Full Family Communication: Wife updated at bedside Disposition Plan: To be determined Consults called: Neurology and nephrology Admission status: Observation  Norval Morton MD [facility]   If 7PM-7AM, please contact night-coverage   06/16/2020, 5:05 PM

## 2020-06-16 NOTE — ED Notes (Signed)
Pt becoming increasingly agitated.  Attempting to get out of beds.  Ripping chords off.  Yelling, "Please help".

## 2020-06-16 NOTE — Progress Notes (Signed)
EEG complete - results pending 

## 2020-06-16 NOTE — ED Notes (Signed)
Pt cannot follow commands.  Will not continue swallow eval.

## 2020-06-16 NOTE — ED Provider Notes (Signed)
[facility] EMERGENCY DEPARTMENT Provider Note   CSN: [id number] Arrival date & time: 06/16/20  1215     History Chief Complaint  Patient presents with  . Altered Mental Status    Abdulahad Mederos Castillo is a 60 y.o. male.  HPI Patient has had a recurrent episode of similar presentation.  For several months, when he goes to dialysis, partway through he develops a profound mental status change.  This has been evaluated at [facility] including CT angiograms without a definitive diagnosis.  Reportedly, patient becomes somnolent and confused and the symptoms then resolve over the ensuing several hours.  No etiology for this has been identified.  Day, at about 1030 patient was at dialysis and became verbally unresponsive.  He did not answer any questions.  He seemed very confused.  Frequently the symptoms apparently start with the perception of right sided tingling.  Patient cannot answer any questions to give additional history.    Past Medical History:  Diagnosis Date  . Venous stasis dermatitis of both lower extremities     Patient Active Problem List   Diagnosis Date Noted  . Acute metabolic encephalopathy 45/36/4680  . ESRD (end stage renal disease) ([place]) 09/04/2019  . Acute renal failure superimposed on chronic kidney disease ([place]) 07/03/2016  . Diabetes mellitus type 2 in obese ([place]) 07/03/2016  . Hyperkalemia 07/03/2016  . Hypertensive urgency 07/03/2016  . Hyperlipidemia 07/03/2016  . Anemia of chronic disease 07/03/2016  . ARF (acute renal failure) ([place]) 07/03/2016    Past Surgical History:  Procedure Laterality Date  . A/V FISTULAGRAM Left 09/27/2019   Procedure: A/V FISTULAGRAM;  Surgeon: Marty Heck, MD;  Location: [facility];  Service: Cardiovascular;  Laterality: Left;  . AV FISTULA PLACEMENT Left 09/17/2016   Procedure: ARTERIOVENOUS (AV) FISTULA CREATION;  Surgeon: [name], MD;  Location: [facility] OR;  Service: Vascular;   Laterality: Left;  . ENDOVENOUS ABLATION SAPHENOUS VEIN W/ LASER Right 04/08/2016   EVLA R greater saphenous vein by Curt Jews MD  . ENDOVENOUS ABLATION SAPHENOUS VEIN W/ LASER Left 05/13/2016   endovenous laser ablation (left greater saphenous vein) by Curt Jews MD        Family History  Problem Relation Age of Onset  . Heart attack Father     Social History   Tobacco Use  . Smoking status: Never Smoker  . Smokeless tobacco: Never Used  Substance Use Topics  . Alcohol use: No  . Drug use: No    Home Medications Prior to Admission medications   Medication Sig Start Date End Date Taking? Authorizing Provider  AURYXIA 1 GM 210 MG(Fe) tablet TAKE 2 TABLETS BY MOUTH THREE TIMES A DAY WITH MEALS AND 1 TABLET WITH SNACKS 08/17/19   [provider]  carvedilol (COREG) 25 MG tablet Take 25 mg by mouth 2 (two) times daily with a meal.  07/28/15   [provider]  Insulin Degludec (TRESIBA FLEXTOUCH) 200 UNIT/ML SOPN Inject 10 Units into the skin every morning. 07/06/16   Reyne Dumas, MD  Insulin Pen Needle (BD ULTRA-FINE PEN NEEDLES) 29G X 12.7MM MISC USE AS DIRECTED EVERY DAY 09/22/19   [provider]  irbesartan (AVAPRO) 150 MG tablet irbesartan tablet 09/05/19   [provider]  neomycin-bacitracin-polymyxin (NEOSPORIN) ointment Apply 1 application topically daily as needed for wound care. apply to eye    [provider]  pravastatin (PRAVACHOL) 80 MG tablet Take 80 mg by mouth every evening.  [provider]  Semaglutide (RYBELSUS) 14 MG TABS TAKE 1 TABLET (14 MG TOTAL) BY MOUTH EVERY MORNING. DIABETES 10/05/18   [provider]  VITAMIN D PO Take by mouth. 08/22/19 08/20/20  [provider]    Allergies    Pioglitazone, Adhesive [tape], and Other  Review of Systems   Review of Systems Level 5 caveat cannot obtain review of systems due to patient condition Physical Exam Updated Vital Signs BP (!) 201/116    Pulse 95   Resp 18   SpO2 95%   Physical Exam Constitutional:      Comments: Patient is awake but somnolent.  When roused his eyes are wide open and he appears very confused.  No respiratory distress  HENT:     Head: Normocephalic and atraumatic.     Mouth/Throat:     Mouth: Mucous membranes are moist.     Pharynx: Oropharynx is clear.  Eyes:     Comments: 3 mm symmetric and responsive  Cardiovascular:     Rate and Rhythm: Normal rate and regular rhythm.  Pulmonary:     Effort: Pulmonary effort is normal.     Breath sounds: Normal breath sounds.  Abdominal:     General: There is no distension.     Palpations: Abdomen is soft.     Tenderness: There is no abdominal tenderness. There is no guarding.  Musculoskeletal:     Comments: No significant peripheral edema.  Patient does have changes of chronic venous stasis.  No focal extremity deformities or acute appearing injuries  Neurological:     Comments: Patient appears very confused.  If I call his name he can turn his head towards me and find with "yes".  He cannot repeat his name.  He cannot generate any other words.  He does not really give a response to any other questions.  If I pick up his arms he will hold them up briefly and seemed confused.  Then he put some back down.  He does not follow commands to perform grip strength.  Patient has tried to reposition himself in the bed and will use both legs to bend and push himself up.  On exam there is no flaccid paralysis.     ED Results / Procedures / Treatments   Labs (all labs ordered are listed, but only abnormal results are displayed) Labs Reviewed  CBC - Abnormal; Notable for the following components:      Result Value   RBC 3.42 (*)    Hemoglobin 10.4 (*)    HCT 30.6 (*)    All other components within normal limits  DIFFERENTIAL - Abnormal; Notable for the following components:   Lymphs Abs 0.5 (*)    All other components within normal limits  COMPREHENSIVE METABOLIC  PANEL - Abnormal; Notable for the following components:   Sodium 134 (*)    Chloride 96 (*)    Glucose, Bld 126 (*)    BUN 73 (*)    Creatinine, Ser 10.85 (*)    AST 11 (*)    GFR, Estimated 5 (*)    All other components within normal limits  AMMONIA - Abnormal; Notable for the following components:   Ammonia 36 (*)    All other components within normal limits  I-STAT CHEM 8, ED - Abnormal; Notable for the following components:   Sodium 134 (*)    BUN 73 (*)    Creatinine, Ser 11.10 (*)    Glucose, Bld 121 (*)  Calcium, Ion 1.01 (*)    Hemoglobin 10.5 (*)    HCT 31.0 (*)    All other components within normal limits  CBG MONITORING, ED - Abnormal; Notable for the following components:   Glucose-Capillary 135 (*)    All other components within normal limits  CBG MONITORING, ED - Abnormal; Notable for the following components:   Glucose-Capillary 104 (*)    All other components within normal limits  RESP PANEL BY RT-PCR (FLU A&B, COVID) ARPGX2  ETHANOL  PROTIME-INR  APTT  RAPID URINE DRUG SCREEN, HOSP PERFORMED  URINALYSIS, ROUTINE W REFLEX MICROSCOPIC    EKG EKG Interpretation  Date/Time:  Monday June 16 2020 12:27:35 EST Ventricular Rate:  68 PR Interval:    QRS Duration: 86 QT Interval:  421 QTC Calculation: 448 R Axis:   -5 Text Interpretation: Sinus rhythm Abnormal R-wave progression, early transition Minimal ST elevation, anterior leads Baseline wander in lead(s) II no change from previous Confirmed by Charlesetta Shanks [phone]) on 06/16/2020 4:14:00 PM   Radiology CT Code Stroke CTA Head W/WO contrast  Result Date: 06/16/2020 CLINICAL DATA:  Focal neuro deficit, greater than 6 hours, stroke suspected. Aphasia, altered mental status EXAM: CT ANGIOGRAPHY HEAD AND NECK TECHNIQUE: Multidetector CT imaging of the head and neck was performed using the standard protocol during bolus administration of intravenous contrast. Multiplanar CT image reconstructions and MIPs  were obtained to evaluate the vascular anatomy. Carotid stenosis measurements (when applicable) are obtained utilizing NASCET criteria, using the distal internal carotid diameter as the denominator. CONTRAST:  24mL OMNIPAQUE IOHEXOL 350 MG/ML SOLN COMPARISON:  Noncontrast head CT performed earlier today 06/16/2020. MRI/MRA head 05/03/2020. MRA neck 05/03/2020. FINDINGS: CTA NECK FINDINGS Aortic arch: Standard aortic branching. The visualized aortic arch is unremarkable. Minimal atherosclerotic plaque within the proximal left subclavian artery. No hemodynamically significant innominate or proximal subclavian artery stenosis. Right carotid system: CCA and ICA patent within the neck without stenosis. Minimal atherosclerotic plaque within the carotid bifurcation. Tortuosity of the cervical ICA. Left carotid system: CCA and ICA patent within the neck without stenosis. Mild predominantly soft plaque within the mid to distal ICA. Vertebral arteries: Codominant and patent within the neck without stenosis. Skeleton: No acute bony abnormality or aggressive osseous lesion. Cervical spondylosis with multilevel disc space narrowing, disc bulges, posterior disc osteophytes, uncovertebral hypertrophy. Disc space narrowing is greatest at C5-C6 and C6-C7 (advanced at these levels). Other neck: No neck mass or cervical lymphadenopathy. Upper chest: No consolidation within the imaged lung apices. Review of the MIP images confirms the above findings CTA HEAD FINDINGS Anterior circulation: The intracranial internal carotid arteries are patent. Nonstenotic calcified plaque within these vessels bilaterally. The M1 middle cerebral arteries are patent. No M2 proximal branch occlusion or high-grade proximal stenosis is identified. The anterior cerebral arteries are patent. No intracranial aneurysm is identified. Posterior circulation: The intracranial vertebral arteries are patent. Mild nonstenotic calcified plaque within both V4 segments.  The basilar artery is patent. The posterior cerebral arteries are patent. A right posterior communicating artery is present. The left posterior communicating artery is hypoplastic or absent. Venous sinuses: Within the limitations of contrast timing, no convincing thrombus. Anatomic variants: As described Review of the MIP images confirms the above findings No large vessel occlusion. These results were communicated to Dr. Leonel Ramsay at 1:50 pmon 2/28/2022by text page via the [facility] messaging system. IMPRESSION: CTA neck: Common carotid, internal carotid and vertebral arteries patent within the neck without hemodynamically significant stenosis. Mild atherosclerotic disease within the carotid  systems as described. CTA head: 1. No intracranial large vessel occlusion or proximal high-grade arterial stenosis. 2. Nonstenotic calcified plaque within the intracranial internal carotid arteries and V4 vertebral arteries bilaterally. Electronically Signed   By: Kellie Simmering DO   On: 06/16/2020 13:52   CT Code Stroke CTA Neck W/WO contrast  Result Date: 06/16/2020 CLINICAL DATA:  Focal neuro deficit, greater than 6 hours, stroke suspected. Aphasia, altered mental status EXAM: CT ANGIOGRAPHY HEAD AND NECK TECHNIQUE: Multidetector CT imaging of the head and neck was performed using the standard protocol during bolus administration of intravenous contrast. Multiplanar CT image reconstructions and MIPs were obtained to evaluate the vascular anatomy. Carotid stenosis measurements (when applicable) are obtained utilizing NASCET criteria, using the distal internal carotid diameter as the denominator. CONTRAST:  51mL OMNIPAQUE IOHEXOL 350 MG/ML SOLN COMPARISON:  Noncontrast head CT performed earlier today 06/16/2020. MRI/MRA head 05/03/2020. MRA neck 05/03/2020. FINDINGS: CTA NECK FINDINGS Aortic arch: Standard aortic branching. The visualized aortic arch is unremarkable. Minimal atherosclerotic plaque within the proximal left  subclavian artery. No hemodynamically significant innominate or proximal subclavian artery stenosis. Right carotid system: CCA and ICA patent within the neck without stenosis. Minimal atherosclerotic plaque within the carotid bifurcation. Tortuosity of the cervical ICA. Left carotid system: CCA and ICA patent within the neck without stenosis. Mild predominantly soft plaque within the mid to distal ICA. Vertebral arteries: Codominant and patent within the neck without stenosis. Skeleton: No acute bony abnormality or aggressive osseous lesion. Cervical spondylosis with multilevel disc space narrowing, disc bulges, posterior disc osteophytes, uncovertebral hypertrophy. Disc space narrowing is greatest at C5-C6 and C6-C7 (advanced at these levels). Other neck: No neck mass or cervical lymphadenopathy. Upper chest: No consolidation within the imaged lung apices. Review of the MIP images confirms the above findings CTA HEAD FINDINGS Anterior circulation: The intracranial internal carotid arteries are patent. Nonstenotic calcified plaque within these vessels bilaterally. The M1 middle cerebral arteries are patent. No M2 proximal branch occlusion or high-grade proximal stenosis is identified. The anterior cerebral arteries are patent. No intracranial aneurysm is identified. Posterior circulation: The intracranial vertebral arteries are patent. Mild nonstenotic calcified plaque within both V4 segments. The basilar artery is patent. The posterior cerebral arteries are patent. A right posterior communicating artery is present. The left posterior communicating artery is hypoplastic or absent. Venous sinuses: Within the limitations of contrast timing, no convincing thrombus. Anatomic variants: As described Review of the MIP images confirms the above findings No large vessel occlusion. These results were communicated to Dr. Leonel Ramsay at 1:50 pmon 2/28/2022by text page via the [facility] messaging system. IMPRESSION: CTA neck: Common  carotid, internal carotid and vertebral arteries patent within the neck without hemodynamically significant stenosis. Mild atherosclerotic disease within the carotid systems as described. CTA head: 1. No intracranial large vessel occlusion or proximal high-grade arterial stenosis. 2. Nonstenotic calcified plaque within the intracranial internal carotid arteries and V4 vertebral arteries bilaterally. Electronically Signed   By: Kellie Simmering DO   On: 06/16/2020 13:52   CT HEAD CODE STROKE WO CONTRAST  Result Date: 06/16/2020 CLINICAL DATA:  Code stroke.  Neuro deficit, acute, stroke suspected EXAM: CT HEAD WITHOUT CONTRAST TECHNIQUE: Contiguous axial images were obtained from the base of the skull through the vertex without intravenous contrast. COMPARISON:  05/09/2020 and prior. FINDINGS: Please note that image quality is degraded by motion artifact. Brain: No acute infarct or intracranial hemorrhage. No mass lesion. No midline shift, ventriculomegaly or extra-axial fluid collection. Mild cerebral atrophy with ex  vacuo dilatation. Small right insular hypodensity. Vascular: No hyperdense vessel or unexpected calcification. Bilateral skull base atherosclerotic calcifications. Skull: Negative for fracture or focal lesion. Sinuses/Orbits: No acute finding. Other: None. ASPECTS [facility] Stroke Program Early CT Score) - Ganglionic level infarction (caudate, lentiform nuclei, internal capsule, insula, M1-M3 cortex): 6 - Supraganglionic infarction (M4-M6 cortex): 3 Total score (0-10 with 10 being normal): 9 IMPRESSION: 1. Small right insular hypodensity may reflect age indeterminate lacunar insult versus prominent sulcus. Motion degraded exam. 2. Mild cerebral atrophy. 3. ASPECTS is 9 4. Code stroke imaging results were communicated on 06/16/2020 at 1:18 pm to provider Dr. Leonel Ramsay via secure text paging. Electronically Signed   By: Primitivo Gauze M.D.   On: 06/16/2020 13:21    Procedures Procedures  CRITICAL  CARE Performed by: Charlesetta Shanks   Total critical care time: 45 minutes  Critical care time was exclusive of separately billable procedures and treating other patients.  Critical care was necessary to treat or prevent imminent or life-threatening deterioration.  Critical care was time spent personally by me on the following activities: development of treatment plan with patient and/or surrogate as well as nursing, discussions with consultants, evaluation of patient's response to treatment, examination of patient, obtaining history from patient or surrogate, ordering and performing treatments and interventions, ordering and review of laboratory studies, ordering and review of radiographic studies, pulse oximetry and re-evaluation of patient's condition. Medications Ordered in ED Medications  LORazepam (ATIVAN) 2 MG/ML injection (has no administration in time range)  fentaNYL (SUBLIMAZE) injection 50 mcg (50 mcg Intravenous Given 06/16/20 1330)  ondansetron (ZOFRAN) injection 4 mg (4 mg Intravenous Given 06/16/20 1333)  LORazepam (ATIVAN) injection 1 mg (1 mg Intravenous Given 06/16/20 1331)  iohexol (OMNIPAQUE) 350 MG/ML injection 75 mL (75 mLs Intravenous Contrast Given 06/16/20 1325)  LORazepam (ATIVAN) injection 1 mg (1 mg Intravenous Given 06/16/20 1448)  haloperidol lactate (HALDOL) injection 5 mg (5 mg Intravenous Given 06/16/20 1555)  labetalol (NORMODYNE) injection 10 mg (10 mg Intravenous Given 06/16/20 1556)    ED Course  I have reviewed the triage vital signs and the nursing notes.  Pertinent labs & imaging results that were available during my care of the patient were reviewed by me and considered in my medical decision making (see chart for details).    MDM Rules/Calculators/A&P                         Consult: Dr. Leonel Ramsay consulted and code stroke initiated. Consult: Dr. Tamala Julian for admission [facility] hospitalist  Patient presents with mental status change of unknown etiology.   This apparently occurs acutely during dialysis.  At this time no LVO/acute stroke identified.  Sideration given for possible seizure and postictal state.  Also, Dr. Leonel Ramsay considers PRES.  This time will use as needed labetalol with a goal blood pressure of systolic 263F.  Currently plan is not to significantly drop pressure with concern for possible ischemic risk with hypotension.  It appears that the patient become somewhat hypotensive during dialysis.  Patient's baseline blood pressure is unknown.  Unclear if he is chronically severely hypertensive or reasonably well controlled on medications.  Per Dr. Leonel Ramsay PRES might be precipitated by autonomic instability of variable blood pressures.  Patient will be admitted for ongoing diagnostic evaluation and management. Final Clinical Impression(s) / ED Diagnoses Final diagnoses:  Delirium  Hypertensive emergency  ESRD (end stage renal disease) on dialysis [facility])    Rx / DC Orders ED Discharge  Orders    None       Charlesetta Shanks, MD 06/16/20 [id number]

## 2020-06-16 NOTE — Consult Note (Addendum)
[facility] Renal Consultation Note    Indication for Consultation:  Management of ESRD/hemodialysis; anemia, hypertension/volume and secondary hyperparathyroidism  KGU:RKYHC, Jaymes Graff, MD  HPI: Scott Castillo is a 60 y.o. 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 On HD MWF at [facility], first starting in Aug 2018. His past medical history significant for HTN, DM, and pancreatic CA (2002). Previous outpatient HD notes reviewed, patient noted to experience occasional episodes of mental status changes accompanied with slurred speech, nausea, back pain, and looking paleafter HD treatments. Patient was previously evaluated for these episodes at [facility]. Noted HD treatment shortened 2hrs on 06/13/20 and 1.5hrs on 06/12/20. Also noted that patient mentation would return to baseline after HD treatments. Patient's wife was also at bedside today. According to wife, patient had a similar episode 1 month ago. Today during HD treatment, nursing staff noted patient's symptoms were worsening and not returning to baseline. Concerning for a stroke, EMS was called and patient transported to the ER for further evaluation. Patient seen and examined at bedside in ER. Patient does not respond to commands nor provides eye contact. Exhibiting non-purposeful movement of bilateral upper extremities.  CT head showed R insular hypodensity may reflect age indeterminate lacuncy insult vs. Prominent sulcus. CT Angio Neck showed mild athersclerotic disease within the carotid.  Past Medical History:  Diagnosis Date  . Venous stasis dermatitis of both lower extremities    Past Surgical History:  Procedure Laterality Date  . A/V FISTULAGRAM Left 09/27/2019   Procedure: A/V FISTULAGRAM;  Surgeon: Marty Heck, MD;  Location: [facility];  Service: Cardiovascular;  Laterality: Left;  . AV FISTULA PLACEMENT Left 09/17/2016   Procedure: ARTERIOVENOUS (AV) FISTULA CREATION;  Surgeon:  [name], MD;  Location: [facility] OR;  Service: Vascular;  Laterality: Left;  . ENDOVENOUS ABLATION SAPHENOUS VEIN W/ LASER Right 04/08/2016   EVLA R greater saphenous vein by Curt Jews MD  . ENDOVENOUS ABLATION SAPHENOUS VEIN W/ LASER Left 05/13/2016   endovenous laser ablation (left greater saphenous vein) by Curt Jews MD    Family History  Problem Relation Age of Onset  . Heart attack Father    Social History:  reports that he has never smoked. He has never used smokeless tobacco. He reports that he does not drink alcohol and does not use drugs. Allergies  Allergen Reactions  . Pioglitazone Swelling  . Adhesive [Tape] Other (See Comments)    Causes redness, pt prefers paper tape   . Other Other (See Comments)    Causes redness, pt prefers paper tape    Prior to Admission medications   Medication Sig Start Date End Date Taking? Authorizing Provider  AURYXIA 1 GM 210 MG(Fe) tablet TAKE 2 TABLETS BY MOUTH THREE TIMES A DAY WITH MEALS AND 1 TABLET WITH SNACKS 08/17/19   [provider]  carvedilol (COREG) 25 MG tablet Take 25 mg by mouth 2 (two) times daily with a meal.  07/28/15   [provider]  Insulin Degludec (TRESIBA FLEXTOUCH) 200 UNIT/ML SOPN Inject 10 Units into the skin every morning. 07/06/16   Reyne Dumas, MD  Insulin Pen Needle (BD ULTRA-FINE PEN NEEDLES) 29G X 12.7MM MISC USE AS DIRECTED EVERY DAY 09/22/19   [provider]  irbesartan (AVAPRO) 150 MG tablet irbesartan tablet 09/05/19   [provider]  neomycin-bacitracin-polymyxin (NEOSPORIN) ointment Apply 1 application topically daily as needed for wound care. apply to eye    [provider]  pravastatin (PRAVACHOL)  80 MG tablet Take 80 mg by mouth every evening.     [provider]  Semaglutide (RYBELSUS) 14 MG TABS TAKE 1 TABLET (14 MG TOTAL) BY MOUTH EVERY MORNING. DIABETES 10/05/18   [provider]  VITAMIN D PO Take by mouth. 08/22/19 08/20/20  [provider]   Current Facility-Administered Medications  Medication Dose Route Frequency Provider Last Rate Last Admin  . LORazepam (ATIVAN) 2 MG/ML injection            Current Outpatient Medications  Medication Sig Dispense Refill  . AURYXIA 1 GM 210 MG(Fe) tablet TAKE 2 TABLETS BY MOUTH THREE TIMES A DAY WITH MEALS AND 1 TABLET WITH SNACKS    . carvedilol (COREG) 25 MG tablet Take 25 mg by mouth 2 (two) times daily with a meal.     . Insulin Degludec (TRESIBA FLEXTOUCH) 200 UNIT/ML SOPN Inject 10 Units into the skin every morning. 1 pen 11  . Insulin Pen Needle (BD ULTRA-FINE PEN NEEDLES) 29G X 12.7MM MISC USE AS DIRECTED EVERY DAY    . irbesartan (AVAPRO) 150 MG tablet irbesartan tablet    . neomycin-bacitracin-polymyxin (NEOSPORIN) ointment Apply 1 application topically daily as needed for wound care. apply to eye    . pravastatin (PRAVACHOL) 80 MG tablet Take 80 mg by mouth every evening.     . Semaglutide (RYBELSUS) 14 MG TABS TAKE 1 TABLET (14 MG TOTAL) BY MOUTH EVERY MORNING. DIABETES    . VITAMIN D PO Take by mouth.     Labs: Basic Metabolic Panel: Recent Labs  Lab 06/16/20 1420  NA 134*  K 4.5  CL 100  GLUCOSE 121*  BUN 73*  CREATININE 11.10*   CBC: Recent Labs  Lab 06/16/20 1359 06/16/20 1420  WBC 6.1  --   NEUTROABS 5.0  --   HGB 10.4* 10.5*  HCT 30.6* 31.0*  MCV 89.5  --   PLT 221  --    CBG: Recent Labs  Lab 06/16/20 1250  GLUCAP 135*   Iron Studies: No results for input(s): IRON, TIBC, TRANSFERRIN, FERRITIN in the last 72 hours. Studies/Results: CT Code Stroke CTA Head W/WO contrast  Result Date: 06/16/2020 CLINICAL DATA:  Focal neuro deficit, greater than 6 hours, stroke suspected. Aphasia, altered mental status EXAM: CT ANGIOGRAPHY HEAD AND NECK TECHNIQUE: Multidetector CT imaging of the head and neck was performed using the standard protocol during bolus administration of intravenous contrast. Multiplanar CT image reconstructions and MIPs were  obtained to evaluate the vascular anatomy. Carotid stenosis measurements (when applicable) are obtained utilizing NASCET criteria, using the distal internal carotid diameter as the denominator. CONTRAST:  101mL OMNIPAQUE IOHEXOL 350 MG/ML SOLN COMPARISON:  Noncontrast head CT performed earlier today 06/16/2020. MRI/MRA head 05/03/2020. MRA neck 05/03/2020. FINDINGS: CTA NECK FINDINGS Aortic arch: Standard aortic branching. The visualized aortic arch is unremarkable. Minimal atherosclerotic plaque within the proximal left subclavian artery. No hemodynamically significant innominate or proximal subclavian artery stenosis. Right carotid system: CCA and ICA patent within the neck without stenosis. Minimal atherosclerotic plaque within the carotid bifurcation. Tortuosity of the cervical ICA. Left carotid system: CCA and ICA patent within the neck without stenosis. Mild predominantly soft plaque within the mid to distal ICA. Vertebral arteries: Codominant and patent within the neck without stenosis. Skeleton: No acute bony abnormality or aggressive osseous lesion. Cervical spondylosis with multilevel disc space narrowing, disc bulges, posterior disc osteophytes, uncovertebral hypertrophy. Disc space narrowing is greatest at C5-C6 and C6-C7 (advanced at these levels). Other  neck: No neck mass or cervical lymphadenopathy. Upper chest: No consolidation within the imaged lung apices. Review of the MIP images confirms the above findings CTA HEAD FINDINGS Anterior circulation: The intracranial internal carotid arteries are patent. Nonstenotic calcified plaque within these vessels bilaterally. The M1 middle cerebral arteries are patent. No M2 proximal branch occlusion or high-grade proximal stenosis is identified. The anterior cerebral arteries are patent. No intracranial aneurysm is identified. Posterior circulation: The intracranial vertebral arteries are patent. Mild nonstenotic calcified plaque within both V4 segments. The  basilar artery is patent. The posterior cerebral arteries are patent. A right posterior communicating artery is present. The left posterior communicating artery is hypoplastic or absent. Venous sinuses: Within the limitations of contrast timing, no convincing thrombus. Anatomic variants: As described Review of the MIP images confirms the above findings No large vessel occlusion. These results were communicated to Dr. Leonel Ramsay at 1:50 pmon 2/28/2022by text page via the [facility] messaging system. IMPRESSION: CTA neck: Common carotid, internal carotid and vertebral arteries patent within the neck without hemodynamically significant stenosis. Mild atherosclerotic disease within the carotid systems as described. CTA head: 1. No intracranial large vessel occlusion or proximal high-grade arterial stenosis. 2. Nonstenotic calcified plaque within the intracranial internal carotid arteries and V4 vertebral arteries bilaterally. Electronically Signed   By:  Simmering DO   On: 06/16/2020 13:52   CT Code Stroke CTA Neck W/WO contrast  Result Date: 06/16/2020 CLINICAL DATA:  Focal neuro deficit, greater than 6 hours, stroke suspected. Aphasia, altered mental status EXAM: CT ANGIOGRAPHY HEAD AND NECK TECHNIQUE: Multidetector CT imaging of the head and neck was performed using the standard protocol during bolus administration of intravenous contrast. Multiplanar CT image reconstructions and MIPs were obtained to evaluate the vascular anatomy. Carotid stenosis measurements (when applicable) are obtained utilizing NASCET criteria, using the distal internal carotid diameter as the denominator. CONTRAST:  38mL OMNIPAQUE IOHEXOL 350 MG/ML SOLN COMPARISON:  Noncontrast head CT performed earlier today 06/16/2020. MRI/MRA head 05/03/2020. MRA neck 05/03/2020. FINDINGS: CTA NECK FINDINGS Aortic arch: Standard aortic branching. The visualized aortic arch is unremarkable. Minimal atherosclerotic plaque within the proximal left  subclavian artery. No hemodynamically significant innominate or proximal subclavian artery stenosis. Right carotid system: CCA and ICA patent within the neck without stenosis. Minimal atherosclerotic plaque within the carotid bifurcation. Tortuosity of the cervical ICA. Left carotid system: CCA and ICA patent within the neck without stenosis. Mild predominantly soft plaque within the mid to distal ICA. Vertebral arteries: Codominant and patent within the neck without stenosis. Skeleton: No acute bony abnormality or aggressive osseous lesion. Cervical spondylosis with multilevel disc space narrowing, disc bulges, posterior disc osteophytes, uncovertebral hypertrophy. Disc space narrowing is greatest at C5-C6 and C6-C7 (advanced at these levels). Other neck: No neck mass or cervical lymphadenopathy. Upper chest: No consolidation within the imaged lung apices. Review of the MIP images confirms the above findings CTA HEAD FINDINGS Anterior circulation: The intracranial internal carotid arteries are patent. Nonstenotic calcified plaque within these vessels bilaterally. The M1 middle cerebral arteries are patent. No M2 proximal branch occlusion or high-grade proximal stenosis is identified. The anterior cerebral arteries are patent. No intracranial aneurysm is identified. Posterior circulation: The intracranial vertebral arteries are patent. Mild nonstenotic calcified plaque within both V4 segments. The basilar artery is patent. The posterior cerebral arteries are patent. A right posterior communicating artery is present. The left posterior communicating artery is hypoplastic or absent. Venous sinuses: Within the limitations of contrast timing, no convincing thrombus. Anatomic variants: As  described Review of the MIP images confirms the above findings No large vessel occlusion. These results were communicated to Dr. Leonel Ramsay at 1:50 pmon 2/28/2022by text page via the [facility] messaging system. IMPRESSION: CTA neck: Common  carotid, internal carotid and vertebral arteries patent within the neck without hemodynamically significant stenosis. Mild atherosclerotic disease within the carotid systems as described. CTA head: 1. No intracranial large vessel occlusion or proximal high-grade arterial stenosis. 2. Nonstenotic calcified plaque within the intracranial internal carotid arteries and V4 vertebral arteries bilaterally. Electronically Signed   By:  Simmering DO   On: 06/16/2020 13:52   CT HEAD CODE STROKE WO CONTRAST  Result Date: 06/16/2020 CLINICAL DATA:  Code stroke.  Neuro deficit, acute, stroke suspected EXAM: CT HEAD WITHOUT CONTRAST TECHNIQUE: Contiguous axial images were obtained from the base of the skull through the vertex without intravenous contrast. COMPARISON:  05/09/2020 and prior. FINDINGS: Please note that image quality is degraded by motion artifact. Brain: No acute infarct or intracranial hemorrhage. No mass lesion. No midline shift, ventriculomegaly or extra-axial fluid collection. Mild cerebral atrophy with ex vacuo dilatation. Small right insular hypodensity. Vascular: No hyperdense vessel or unexpected calcification. Bilateral skull base atherosclerotic calcifications. Skull: Negative for fracture or focal lesion. Sinuses/Orbits: No acute finding. Other: None. ASPECTS [facility] Stroke Program Early CT Score) - Ganglionic level infarction (caudate, lentiform nuclei, internal capsule, insula, M1-M3 cortex): 6 - Supraganglionic infarction (M4-M6 cortex): 3 Total score (0-10 with 10 being normal): 9 IMPRESSION: 1. Small right insular hypodensity may reflect age indeterminate lacunar insult versus prominent sulcus. Motion degraded exam. 2. Mild cerebral atrophy. 3. ASPECTS is 9 4. Code stroke imaging results were communicated on 06/16/2020 at 1:18 pm to provider Dr. Leonel Ramsay via secure text paging. Electronically Signed   By: Primitivo Gauze M.D.   On: 06/16/2020 13:21    ROS: Unable to complete ROS:  Patient currently being evaluated for CVA. Patient currently unable to respond to questions and commands. Unable to provide direct eye contact.  Physical Exam: Vitals:   06/16/20 1300  BP: (!) 182/112  Pulse: 88  Resp: 10     General: Appears unwell, on RA; in no acute respiratory distress Head: NCAT sclera not icteric  Lungs: CTA bilaterally. No wheeze, rales or rhonchi. Breathing is unlabored. Heart: RRR. No murmur, rubs or gallops.  Abdomen: soft, nontender, +BS, no guarding, no rebound tenderness M/S:  Unable to test strength; patient unable to respond to simple commands. Unable to performed grip strength when prompted. Noted patient non-purposeful movement upper/lower extremities Lower extremities: no edema bilateral lower extremities - some abrasions present  Neuro: Patient is confused; Pupils round and equal; No direct eye contact when name is called; responds to name intermittently; unable to respond to simple commands. Dialysis Access: L AVF  Dialysis Orders:  MWF  - [place]  4:30hrs, BFR 450, DFR 500,  EDW 111.5kg, 2K/ 2.25Ca  Access: L AVF Calcitriol 2 mcg PO qHD-last 06/16/20 [name] 210mg  2 tablets TID with meals  Last Labs: Hgb 10.5, TSAT 26 (05/28/20 OP HD), K 4.5, Ca 8.9, P 8.6 (05/28/20 OP HD), PTH 587 (05/28/20 OP HD)  Assessment/Plan: 1.  Altered Mental Status: Unclear etiology. CT Head and Neck performed. No acute stroke identified. Noted high blood pressure trend. May be suggestive for hypertensive encephalopathy? Admitted to [facility] for further observation and management.  There is not a syndrome to explain these episodes during HD.  Sometimes there can be altered MS associated with BP being acutely  lowered but it does not look like his BP ever lowers during HD.  It may be also possible that he is experiencing a  Dysequilibrium of dialysis since his clearance has not been good with shortened treatments.  The other possibility which may be more  likely is a hypertensive encephalopathy 2.  ESRD -  On HD MWF. Only received 2hrs treatment today. No evidence of volume overload at this time. Not in acute respiratory distress. Due to current mentation, not sure if receiving HD treatment on HD unit is appropriate at this time. Plan is to continue to monitor closely. Patient may have to receive HD at bedside depending on mental status. Will monitor overnight and follow-up first thing in the morning. 3.  Hypertension/volume  - Blood pressures high. Labetalol ordered X 1 administered. Continue Hydralazine IV Q 4 hrs PRN with a goal blood pressure of systolic 989Q. No signs for edema. More likely will receive HD tomorrow morning. 4.  Anemia of CKD - Hgb 10.5, not on ESA at outpatient HD at this time. Will continue to monitor CBC trends. 5.  Secondary Hyperparathyroidism - Due to current mental status, PO medications not indicated at this time. Binders and Calcitriol on hold for now. Will monitor trends. 6.  Nutrition - Patient is NPO; Awaiting stroke swallow screen. Once clinically stable, advance as tolerated to renal diet.  Tobie Poet, NP [facility] 06/16/2020, 3:02 PM   Patient seen and examined, agree with above note with above modifications.  Patient not known very well to me but on dialysis since 2018.  Of late, has been having episodes during HD hallmarked by MS changes and slurred speech.  Today, he was oriented pre dialysis then developed more significant MS changes during his treatment.  Currently not oriented, combative not following commands.  Head CT not indicative of acute CVA.  I wonder if this is more consistent with hypertensive encephalopathy vs a dysequilibrium syndrome.  I am leaving him alone tonight.  Will look to do dialysis tomorrow or Wednesday based on clinical factors.   In order to combat dialysis dysequilibrium can run him at a lower BFR and slowly increase the time.  I agree with management of BP tonight  with use of iv PRN meds Corliss Parish, MD 06/16/2020

## 2020-06-16 NOTE — Code Documentation (Signed)
Stroke Response Nurse Documentation Code Documentation  [place] is a 60 y.o. male arriving to [place] ED via EMS on 06/16/2020 with past medical hx of hemodialysis, HTN. Code stroke was activated by ED. Patient from dialysis clinic where he was LKW at 1030. For the past couple of dialysis appointments, pt has had episodes where after an hour into dialysis, pt becomes pale, complains of nausea and back pain, and then he has trouble getting his words out. According to the wife, the patient had an episode about a month ago that lasted for four hours with difficulty speaking, but patient returned to baseline. Today, pt had an episode where he was one hour into dialysis when he noted to get pale, become nauseous, altered mental status, and not able to speak correctly at 1030.  EMS was called today and brought patient into the ED.   ED evaluated patient and identified expressive aphasia and activated a Code Stroke.   Stroke team at the bedside after activation. Labs drawn and patient cleared for CT by Dr.Pfeiffer. Patient to CT with team. NIHSS 13, see documentation for details and code stroke times. Patient with disoriented, not following commands, right facial droop, right arm weakness, bilateral leg weakness and Expressive aphasia  on exam. The following imaging was completed:  CT, CTA head and neck. Patient is not a candidate for tPA due to unclear of accuracy of LKW. Care/Plan: q2 mNIHSS/VS, STAT EEG, MRI. Bedside handoff with ED RN Hassan Rowan.    Kathrin Greathouse  Stroke Response RN

## 2020-06-17 ENCOUNTER — Observation Stay (HOSPITAL_COMMUNITY): Payer: Medicare Other

## 2020-06-17 DIAGNOSIS — I6389 Other cerebral infarction: Secondary | ICD-10-CM

## 2020-06-17 DIAGNOSIS — Z6833 Body mass index (BMI) 33.0-33.9, adult: Secondary | ICD-10-CM | POA: Diagnosis not present

## 2020-06-17 DIAGNOSIS — E878 Other disorders of electrolyte and fluid balance, not elsewhere classified: Secondary | ICD-10-CM | POA: Diagnosis present

## 2020-06-17 DIAGNOSIS — R110 Nausea: Secondary | ICD-10-CM | POA: Diagnosis present

## 2020-06-17 DIAGNOSIS — E1141 Type 2 diabetes mellitus with diabetic mononeuropathy: Secondary | ICD-10-CM | POA: Diagnosis present

## 2020-06-17 DIAGNOSIS — G8191 Hemiplegia, unspecified affecting right dominant side: Secondary | ICD-10-CM | POA: Diagnosis present

## 2020-06-17 DIAGNOSIS — Z91048 Other nonmedicinal substance allergy status: Secondary | ICD-10-CM | POA: Diagnosis not present

## 2020-06-17 DIAGNOSIS — Z992 Dependence on renal dialysis: Secondary | ICD-10-CM | POA: Diagnosis not present

## 2020-06-17 DIAGNOSIS — G9341 Metabolic encephalopathy: Secondary | ICD-10-CM | POA: Diagnosis present

## 2020-06-17 DIAGNOSIS — Z20822 Contact with and (suspected) exposure to covid-19: Secondary | ICD-10-CM | POA: Diagnosis present

## 2020-06-17 DIAGNOSIS — I160 Hypertensive urgency: Secondary | ICD-10-CM | POA: Diagnosis not present

## 2020-06-17 DIAGNOSIS — Z8507 Personal history of malignant neoplasm of pancreas: Secondary | ICD-10-CM | POA: Diagnosis not present

## 2020-06-17 DIAGNOSIS — E669 Obesity, unspecified: Secondary | ICD-10-CM | POA: Diagnosis present

## 2020-06-17 DIAGNOSIS — N2581 Secondary hyperparathyroidism of renal origin: Secondary | ICD-10-CM | POA: Diagnosis present

## 2020-06-17 DIAGNOSIS — E1122 Type 2 diabetes mellitus with diabetic chronic kidney disease: Secondary | ICD-10-CM | POA: Diagnosis present

## 2020-06-17 DIAGNOSIS — D631 Anemia in chronic kidney disease: Secondary | ICD-10-CM | POA: Diagnosis present

## 2020-06-17 DIAGNOSIS — Z91040 Latex allergy status: Secondary | ICD-10-CM | POA: Diagnosis not present

## 2020-06-17 DIAGNOSIS — I161 Hypertensive emergency: Secondary | ICD-10-CM | POA: Diagnosis present

## 2020-06-17 DIAGNOSIS — N179 Acute kidney failure, unspecified: Secondary | ICD-10-CM | POA: Diagnosis present

## 2020-06-17 DIAGNOSIS — E1151 Type 2 diabetes mellitus with diabetic peripheral angiopathy without gangrene: Secondary | ICD-10-CM | POA: Diagnosis present

## 2020-06-17 DIAGNOSIS — I674 Hypertensive encephalopathy: Secondary | ICD-10-CM | POA: Diagnosis present

## 2020-06-17 DIAGNOSIS — R4701 Aphasia: Secondary | ICD-10-CM | POA: Diagnosis present

## 2020-06-17 DIAGNOSIS — Z888 Allergy status to other drugs, medicaments and biological substances status: Secondary | ICD-10-CM | POA: Diagnosis not present

## 2020-06-17 DIAGNOSIS — N186 End stage renal disease: Secondary | ICD-10-CM | POA: Diagnosis present

## 2020-06-17 DIAGNOSIS — M47812 Spondylosis without myelopathy or radiculopathy, cervical region: Secondary | ICD-10-CM | POA: Diagnosis present

## 2020-06-17 DIAGNOSIS — E785 Hyperlipidemia, unspecified: Secondary | ICD-10-CM | POA: Diagnosis present

## 2020-06-17 LAB — GLUCOSE, CAPILLARY
Glucose-Capillary: 56 mg/dL — ABNORMAL LOW (ref 70–99)
Glucose-Capillary: 99 mg/dL (ref 70–99)
Glucose-Capillary: 62 mg/dL — ABNORMAL LOW (ref 70–99)
Glucose-Capillary: 64 mg/dL — ABNORMAL LOW (ref 70–99)
Glucose-Capillary: 147 mg/dL — ABNORMAL HIGH (ref 70–99)
Glucose-Capillary: 113 mg/dL — ABNORMAL HIGH (ref 70–99)

## 2020-06-17 LAB — RENAL FUNCTION PANEL
Albumin: 3.3 g/dL — ABNORMAL LOW (ref 3.5–5.0)
Anion gap: 16 — ABNORMAL HIGH (ref 5–15)
BUN: 80 mg/dL — ABNORMAL HIGH (ref 6–20)
CO2: 22 mmol/L (ref 22–32)
Calcium: 9.1 mg/dL (ref 8.9–10.3)
Chloride: 98 mmol/L (ref 98–111)
Creatinine, Ser: 12.41 mg/dL — ABNORMAL HIGH (ref 0.61–1.24)
GFR, Estimated: 4 mL/min — ABNORMAL LOW (ref >60)
Glucose, Bld: 75 mg/dL (ref 70–99)
Phosphorus: 9.2 mg/dL — ABNORMAL HIGH (ref 2.5–4.6)
Potassium: 4.3 mmol/L (ref 3.5–5.1)
Sodium: 136 mmol/L (ref 135–145)

## 2020-06-17 LAB — ECHOCARDIOGRAM COMPLETE
Area-P 1/2: 2.97 cm2
S' Lateral: 3.8 cm

## 2020-06-17 LAB — HEMOGLOBIN A1C
Hgb A1c MFr Bld: 8 % — ABNORMAL HIGH (ref 4.8–5.6)
Mean Plasma Glucose: 182.9 mg/dL

## 2020-06-17 LAB — C DIFFICILE QUICK SCREEN W PCR REFLEX
C Diff antigen: NEGATIVE
C Diff interpretation: NOT DETECTED
C Diff toxin: NEGATIVE

## 2020-06-17 LAB — CBC
HCT: 27.5 % — ABNORMAL LOW (ref 39.0–52.0)
Hemoglobin: 9.6 g/dL — ABNORMAL LOW (ref 13.0–17.0)
MCH: 31 pg (ref 26.0–34.0)
MCHC: 34.9 g/dL (ref 30.0–36.0)
MCV: 88.7 fL (ref 80.0–100.0)
Platelets: 231 10*3/uL (ref 150–400)
RBC: 3.1 MIL/uL — ABNORMAL LOW (ref 4.22–5.81)
RDW: 11.7 % (ref 11.5–15.5)
WBC: 9.6 10*3/uL (ref 4.0–10.5)
nRBC: 0 % (ref 0.0–0.2)

## 2020-06-17 LAB — TSH: TSH: 0.789 u[IU]/mL (ref 0.350–4.500)

## 2020-06-17 LAB — HIV ANTIBODY (ROUTINE TESTING W REFLEX): HIV Screen 4th Generation wRfx: NONREACTIVE

## 2020-06-17 MED ORDER — HEPARIN SODIUM (PORCINE) 1000 UNIT/ML DIALYSIS
20.0000 [IU]/kg | INTRAMUSCULAR | Status: DC | PRN
  Filled 2020-06-17: qty 3

## 2020-06-17 MED ORDER — SODIUM CHLORIDE 0.9 % IV SOLN: 100.0000 mL | INTRAVENOUS | Status: DC | PRN

## 2020-06-17 MED ORDER — CHLORHEXIDINE GLUCONATE CLOTH 2 % EX PADS
6.0000 | MEDICATED_PAD | Freq: Every day | CUTANEOUS | Status: DC
  Administered 2020-06-18 – 2020-06-19 (×2): 6 via TOPICAL

## 2020-06-17 MED ORDER — LIDOCAINE-PRILOCAINE 2.5-2.5 % EX CREA: 1.0000 "application " | TOPICAL_CREAM | CUTANEOUS | Status: DC | PRN

## 2020-06-17 MED ORDER — ACETAMINOPHEN 325 MG PO TABS
ORAL_TABLET | ORAL | Status: AC
  Filled 2020-06-17: qty 2

## 2020-06-17 MED ORDER — PENTAFLUOROPROP-TETRAFLUOROETH EX AERO: 1.0000 "application " | INHALATION_SPRAY | CUTANEOUS | Status: DC | PRN

## 2020-06-17 MED ORDER — HEPARIN SODIUM (PORCINE) 1000 UNIT/ML DIALYSIS: 1000.0000 [IU] | INTRAMUSCULAR | Status: DC | PRN

## 2020-06-17 MED ORDER — ALTEPLASE 2 MG IJ SOLR: 2.0000 mg | Freq: Once | INTRAMUSCULAR | Status: DC | PRN

## 2020-06-17 MED ORDER — DARBEPOETIN ALFA 60 MCG/0.3ML IJ SOSY
60.0000 ug | PREFILLED_SYRINGE | INTRAMUSCULAR | Status: DC
  Filled 2020-06-17: qty 0.3

## 2020-06-17 MED ORDER — LIDOCAINE HCL (PF) 1 % IJ SOLN: 5.0000 mL | INTRAMUSCULAR | Status: DC | PRN

## 2020-06-17 MED ORDER — GLUCAGON HCL RDNA (DIAGNOSTIC) 1 MG IJ SOLR
INTRAMUSCULAR | Status: AC
  Administered 2020-06-17: 1 mg
  Filled 2020-06-17: qty 1

## 2020-06-17 NOTE — Care Management Obs Status (Signed)
[place] NOTIFICATION   Patient Details  Name: HIAWATHA MERRIOTT MRN: [id number] Date of Birth: 12/13/1960   Medicare Observation Status Notification Given:  Yes    Angelita Ingles, RN 06/17/2020, 1:29 PM

## 2020-06-17 NOTE — Progress Notes (Signed)
  Echocardiogram 2D Echocardiogram has been performed.  Scott Castillo 06/17/2020, 10:01 AM

## 2020-06-17 NOTE — Progress Notes (Signed)
Patient awakened from sleep, attempting to pull out dialysis lines/needles. Restraints initiated for safety.

## 2020-06-17 NOTE — Evaluation (Signed)
Occupational Therapy Evaluation Patient Details Name: Scott Castillo MRN: [id number] DOB: [birth year] Today's Date: 06/17/2020    History of Present Illness 60 yo admitted 2/28 with AMS and expressive aphasia after HD. EEG demonstrates encephalopathy. PMhx: ESRD, DM, HLD, anemia, dermatitis   Clinical Impression   PTA, pt lives with spouse and reports typically independent with ADLs and mobility without AD. Pt drives self to dialysis. Pt received sidelying on bedrail, lethargic but easily roused by voice. Pt presents with deficits in cognition, vision, standing balance, and endurance. Pt overall Min A + 2 for mobility with RW and without AD, unsteadiness noted as well as difficulty sequencing RW use. Pt requires Min A for UB ADLs and up to Max A for LB ADLs, including toileting task today. Pt requires cues throughout for proper sequencing and safety (cognition + visual impairments?). At this time, anticipate pt's cognition to clear and improve functional abilities to progress to going home with 24/7. Pt reports his wife works during the day but his mother lives across the road and may be able to stay with him. Plan to progress safety, sequencing and cognition during ADLs/mobility in future sessions.     Follow Up Recommendations  Home health OT;Supervision/Assistance - 24 hour (pending progress)    Equipment Recommendations  None recommended by OT    Recommendations for Other Services       Precautions / Restrictions Precautions Precautions: Fall Restrictions Weight Bearing Restrictions: No      Mobility Bed Mobility Overal bed mobility: Needs Assistance Bed Mobility: Supine to Sit;Sit to Supine     Supine to sit: Min guard Sit to supine: Min guard   General bed mobility comments: pt able to transfer from supine to sitting with bed flat and guarding for safety. mod cues for positioning at EOB prior to sitting with pt able to return to supine without assist    Transfers Overall  transfer level: Needs assistance Equipment used: Rolling walker (2 wheeled);1 person hand held assist;2 person hand held assist Transfers: Sit to/from Stand Sit to Stand: Min guard         General transfer comment: guarding for safety with posterior sway with initial standing    Balance Overall balance assessment: Needs assistance   Sitting balance-Leahy Scale: Fair Sitting balance - Comments: able to sit EOb and at toilet without UE support   Standing balance support: Single extremity supported;Bilateral upper extremity supported Standing balance-Leahy Scale: Poor Standing balance comment: UE support for balance in standing with gait, static standing without UE support at sink minguard                           ADL either performed or assessed with clinical judgement   ADL Overall ADL's : Needs assistance/impaired Eating/Feeding: Set up;Sitting   Grooming: Standing;Min guard;Wash/dry hands Grooming Details (indicate cue type and reason): min guard, cues for problem solving, sequencing and locating paper towels, soap (vision issues?) Upper Body Bathing: Minimal assistance;Sitting   Lower Body Bathing: Moderate assistance;Sit to/from stand   Upper Body Dressing : Minimal assistance;Sitting Upper Body Dressing Details (indicate cue type and reason): Min A to don hospital gown around back sitting EOB Lower Body Dressing: Maximal assistance;Sit to/from stand Lower Body Dressing Details (indicate cue type and reason): Max A overall for donning socks, difficulty problem solving Toilet Transfer: Minimal assistance;Ambulation;BSC;Regular Glass blower/designer Details (indicate cue type and reason): Min A for ambulation to/from bathroom without AD there and use  of RW back, requiring max cues for RW use. Placed BSC over toilet Toileting- Clothing Manipulation and Hygiene: Maximal assistance;Sit to/from stand Toileting - Clothing Manipulation Details (indicate cue type and  reason): Attempting to assist with clothing mgmt and hygiene though getting stool on hands. Max A for thorough hygiene     Functional mobility during ADLs: Minimal assistance;Rolling walker;Cueing for sequencing;Cueing for safety;+2 for physical assistance;+2 for safety/equipment General ADL Comments: Limited primarily by cognitive deficits, poor standing balance and need for cues throughout for sequencing     Vision Baseline Vision/History: Wears glasses Wears Glasses: Reading only Patient Visual Report: Blurring of vision Vision Assessment?: Vision impaired- to be further tested in functional context Additional Comments: Reports wearing readers only, hx of L eye bleeding and blurring of vision. Pt with a lot of difficulty locating trash can, toilet paper, paper towels, etc during ADls     Perception     Praxis      Pertinent Vitals/Pain Pain Assessment: No/denies pain     Hand Dominance Right   Extremity/Trunk Assessment Upper Extremity Assessment Upper Extremity Assessment: Overall WFL for tasks assessed   Lower Extremity Assessment Lower Extremity Assessment: Defer to PT evaluation   Cervical / Trunk Assessment Cervical / Trunk Assessment: Normal   Communication Communication Communication: No difficulties   Cognition Arousal/Alertness: Lethargic Behavior During Therapy: Flat affect Overall Cognitive Status: Impaired/Different from baseline Area of Impairment: Safety/judgement;Attention;Problem solving;Memory;Awareness;Following commands                   Current Attention Level: Sustained Memory: Decreased short-term memory Following Commands: Follows one step commands with increased time;Follows multi-step commands inconsistently Safety/Judgement: Decreased awareness of deficits;Decreased awareness of safety Awareness: Intellectual Problem Solving: Slow processing;Requires verbal cues;Difficulty sequencing;Requires tactile cues General Comments: Pt able  to answer orientation questions appropriately, lethargic and falling asleep when asked questions. Difficulty problem solving and following sequencing commands for use of RW, ADLs, etc   General Comments  Bleeding in between toes on L foot - RN aware    Exercises     Shoulder Instructions      Home Living Family/patient expects to be discharged to:: Private residence Living Arrangements: Spouse/significant other Available Help at Discharge: Family;Other (Comment) (may be available 24/7) Type of Home: Mobile home Home Access: Stairs to enter Entrance Stairs-Number of Steps: 3   Home Layout: One level     Bathroom Shower/Tub: Occupational psychologist: Standard     Home Equipment: Environmental consultant - 4 wheels;Cane - single point;Bedside commode;Shower seat   Additional Comments: mom lives across the road. wife works during the day in food services at [employer]      Prior Functioning/Environment Level of Independence: Independent        Comments: drives self to dialysis, no use of AD. Endorses 1 fall in past year when walking dog        OT Problem List: Decreased activity tolerance;Impaired balance (sitting and/or standing);Impaired vision/perception;Decreased coordination;Decreased cognition;Decreased safety awareness;Decreased knowledge of use of DME or AE      OT Treatment/Interventions: Self-care/ADL training;Therapeutic exercise;DME and/or AE instruction;Therapeutic activities;Patient/family education;Balance training    OT Goals(Current goals can be found in the care plan section) Acute Rehab OT Goals Patient Stated Goal: return home OT Goal Formulation: With patient Time For Goal Achievement: 07/01/20 Potential to Achieve Goals: Good ADL Goals Pt Will Perform Grooming: with modified independence;standing Pt Will Perform Lower Body Bathing: with modified independence;sitting/lateral leans;sit to/from stand Pt Will Perform Lower Body  Dressing: with modified  independence;sitting/lateral leans;sit to/from stand Pt Will Transfer to Toilet: with modified independence;ambulating Pt Will Perform Toileting - Clothing Manipulation and hygiene: with modified independence;sitting/lateral leans;sit to/from stand Additional ADL Goal #1: Pt to demo ability to visually identify > 3 ADL items without verbal cues during functional task  OT Frequency: Min 2X/week   Barriers to D/C:            Co-evaluation PT/OT/SLP Co-Evaluation/Treatment: Yes Reason for Co-Treatment: For patient/therapist safety;To address functional/ADL transfers;Necessary to address cognition/behavior during functional activity PT goals addressed during session: Mobility/safety with mobility OT goals addressed during session: ADL's and self-care      AM-PAC OT "6 Clicks" Daily Activity     Outcome Measure Help from another person eating meals?: A Little Help from another person taking care of personal grooming?: A Little Help from another person toileting, which includes using toliet, bedpan, or urinal?: A Lot Help from another person bathing (including washing, rinsing, drying)?: A Lot Help from another person to put on and taking off regular upper body clothing?: A Little Help from another person to put on and taking off regular lower body clothing?: A Lot 6 Click Score: 15   End of Session Equipment Utilized During Treatment: Gait belt;Rolling walker Nurse Communication: Mobility status;Other (comment) (bleeding in between toes)  Activity Tolerance: Patient tolerated treatment well Patient left: in bed;Other (comment) (with transport to take him to HD)  OT Visit Diagnosis: Unsteadiness on feet (R26.81);Other abnormalities of gait and mobility (R26.89);Muscle weakness (generalized) (M62.81);Other symptoms and signs involving cognitive function                Time: 5701-7793 OT Time Calculation (min): 24 min Charges:  OT General Charges $OT Visit: 1 Visit OT Evaluation $OT  Eval Moderate Complexity: 1 Mod  Malachy Chamber, OTR/L Acute Rehab Services Office: [phone]  Layla Maw 06/17/2020, 11:11 AM

## 2020-06-17 NOTE — Evaluation (Signed)
Physical Therapy Evaluation Patient Details Name: Scott Castillo MRN: [id number] DOB: 07/29/1960 Today's Date: 06/17/2020   History of Present Illness  60 yo admitted 2/28 with AMS and expressive aphasia after HD. EEG demonstrates encephalopathy. PMhx: ESRD, DM, HLD, anemia, dermatitis  Clinical Impression  Pt nearly sideways in bed with head on rail on arrival. Pt able to arouse to name and respond to questions/commands. Pt with decreased cognition, safety awareness, attention and memory, decreased balance, gait and function who will benefit from acute therapy to maximize mobility, safety and function to decrease burden of care. Pt returned to bed for HD.     Follow Up Recommendations Home health PT;Supervision/Assistance - 24 hour    Equipment Recommendations  None recommended by PT    Recommendations for Other Services       Precautions / Restrictions Precautions Precautions: Fall      Mobility  Bed Mobility Overal bed mobility: Needs Assistance Bed Mobility: Supine to Sit;Sit to Supine     Supine to sit: Min guard Sit to supine: Min guard   General bed mobility comments: pt able to transfer from supine to sitting with bed flat and guarding for safety. mod cues for positioning at EOB prior to sitting with pt able to return to supine without assist    Transfers Overall transfer level: Needs assistance   Transfers: Sit to/from Stand Sit to Stand: Min guard         General transfer comment: guarding for safety with posterior sway with initial standing  Ambulation/Gait Ambulation/Gait assistance: Min assist;+2 safety/equipment Gait Distance (Feet): 15 Feet Assistive device: 1 person hand held assist;Rolling walker (2 wheeled) Gait Pattern/deviations: Step-through pattern;Decreased stride length;Wide base of support   Gait velocity interpretation: 1.31 - 2.62 ft/sec, indicative of limited community ambulator General Gait Details: pt able to walk to bathroom with  single UE support with increased sway and min assist for balance and stability. Return from bathroom with rW with pt frequently picking it up off the floor with mod cues to place on ground but improved stability with bil UE support. Unable to walk further due to transporter present for HD  Stairs            Wheelchair Mobility    Modified Rankin (Stroke Patients Only)       Balance Overall balance assessment: Needs assistance   Sitting balance-Leahy Scale: Fair Sitting balance - Comments: able to sit EOb and at toilet without UE support   Standing balance support: Single extremity supported;Bilateral upper extremity supported Standing balance-Leahy Scale: Poor Standing balance comment: UE support for balance in standing with gait, static standing without UE support at sink minguard                             Pertinent Vitals/Pain Pain Assessment: No/denies pain    Home Living Family/patient expects to be discharged to:: Private residence Living Arrangements: Spouse/significant other Available Help at Discharge: Family;Available 24 hours/day Type of Home: Mobile home Home Access: Stairs to enter   Entrance Stairs-Number of Steps: 3 Home Layout: One level Home Equipment: Walker - 4 wheels;Cane - single point;Bedside commode;Shower seat Additional Comments: mom lives across the road    Prior Function Level of Independence: Independent               Hand Dominance        Extremity/Trunk Assessment  Communication   Communication: No difficulties  Cognition Arousal/Alertness: Lethargic Behavior During Therapy: Flat affect Overall Cognitive Status: Impaired/Different from baseline Area of Impairment: Safety/judgement;Attention;Problem solving;Memory                   Current Attention Level: Sustained Memory: Decreased short-term memory   Safety/Judgement: Decreased awareness of deficits   Problem Solving: Slow  processing;Requires verbal cues General Comments: pt frequently drifting to sleep during questions but answering appropriately.      General Comments      Exercises     Assessment/Plan    PT Assessment Patient needs continued PT services  PT Problem List Decreased strength;Decreased mobility;Decreased activity tolerance;Decreased balance;Decreased cognition;Decreased knowledge of use of DME;Decreased skin integrity       PT Treatment Interventions Gait training;Balance training;Functional mobility training;Therapeutic activities;Stair training;Neuromuscular re-education;Cognitive remediation;Patient/family education;DME instruction;Therapeutic exercise    PT Goals (Current goals can be found in the Care Plan section)  Acute Rehab PT Goals Patient Stated Goal: return home PT Goal Formulation: With patient Time For Goal Achievement: 07/01/20 Potential to Achieve Goals: Fair    Frequency Min 3X/week   Barriers to discharge Decreased caregiver support      Co-evaluation PT/OT/SLP Co-Evaluation/Treatment: Yes Reason for Co-Treatment: Complexity of the patient's impairments (multi-system involvement);Necessary to address cognition/behavior during functional activity;For patient/therapist safety PT goals addressed during session: Mobility/safety with mobility         AM-PAC PT "6 Clicks" Mobility  Outcome Measure Help needed turning from your back to your side while in a flat bed without using bedrails?: None Help needed moving from lying on your back to sitting on the side of a flat bed without using bedrails?: None Help needed moving to and from a bed to a chair (including a wheelchair)?: A Little Help needed standing up from a chair using your arms (e.g., wheelchair or bedside chair)?: A Little Help needed to walk in hospital room?: A Little Help needed climbing 3-5 steps with a railing? : A Lot 6 Click Score: 19    End of Session Equipment Utilized During Treatment:  Gait belt Activity Tolerance: Patient tolerated treatment well Patient left: in bed;with call bell/phone within reach;Other (comment) (with tranporter for HD)   PT Visit Diagnosis: Other abnormalities of gait and mobility (R26.89);Difficulty in walking, not elsewhere classified (R26.2);Unsteadiness on feet (R26.81)    Time: 0677-0340 PT Time Calculation (min) (ACUTE ONLY): 24 min   Charges:   PT Evaluation $PT Eval Moderate Complexity: 1 Mod          Jefferson Fullam P, PT Acute Rehabilitation Services Pager: [phone] Office: [place] Marnae Madani 06/17/2020, 10:48 AM

## 2020-06-17 NOTE — Progress Notes (Signed)
Hemodialysis- Tolerated well. UF 2.5L without issue. Restraints removed post HD. Currently following commands. Reported off to primary RN. Sitter at bedside.

## 2020-06-17 NOTE — Progress Notes (Signed)
Hemodialysis- Patient able to follow commands at present. Discussed the importance of keeping access arm straight. Continue to monitor. Does have some pain in R Hip. Medicated.

## 2020-06-17 NOTE — Progress Notes (Signed)
PROGRESS NOTE    Scott Castillo  ZOX:[id number] DOB: 07-24-1960 DOA: 06/16/2020 PCP: Algis Greenhouse, MD   Brief Narrative:  HPI: Scott Castillo is a 60 y.o. male with medical history significant of ESRD on HD(MWF), DM type II, anemia of chronic disease presents with after being noted to be acutely altered during his hemodialysis session today.  His wife helps provide additional history.  Apparently patient has been on dialysis for 3 years now, but over the last month he has been dealing with issues with a pinched nerve in his neck.  Over the last month patient has had several episodes during dialysis.  Apparently he gets pale, nauseous, complains of back/neck pain, numbness in his arms then legs, and subsequently developed slurred speech.  Today he was able to drive himself to hemodialysis today at 10 AM.  However around 10:30 AM he  noted to be significantly confused and was unable to talk.  Wife states that he is not even able to recognize her and everything that he says does not make sense.  Symptoms had previously resolved after few hours, but for some reason symptoms persisted longer today.  In the past he had also been able to tell her what was going on normally he is supposed to dialyze 4 hours, but here recently since the symptoms started he has not been able to dialyze more than 2 hours.  ED Course: Upon admission into the emergency department patient was seen to have heart rates 86 107, blood pressure elevated up to 233/104, and all other vital signs maintained. He was evaluated by neurology. CT/CTA of the head neck did not show any large vessel occlusion, but did note a small right insular hypodensity of indeterminate age.  Patient was not a TPA candidate as his last known normal was not clear.Patient was noted to show some right sided weakness. Labs significant for hemoglobin 10.4, sodium 134, potassium 4.4, BUN 73, creatinine 10.85, glucose 126, and ammonia level 36.  He was noted to be  yelling and screaming during his time in the emergency department.  He had been given Ativan multiple times, Haldol, labetalol IV, and fentanyl 50 mcg IV.  Assessment & Plan:   Principal Problem:   Acute metabolic encephalopathy Active Problems:   Diabetes mellitus type 2 in obese (HCC)   Hypertensive urgency   Anemia of chronic disease   ESRD (end stage renal disease) (HCC)   Aphasia  Expressive aphasia/acute metabolic encephalopathy: Patient presents with these repeated episodes of being altered with expressive aphasia. CT/CTA of the head and neck did not note any large vessel occlusion, but did note a small right insular hypodensity concerning for indeterminate lacunar infarct versus prominent sulcus.  Neurology was consulted and question if symptoms were secondary to a  PRES vs. disequilibrium syndrome vs. stroke vs. Seizure.  EEG negative for any epileptiform activity.  MRI is still pending.  Interestingly, patient is fidgety, moving all extremities, keeps his eyes closed but is able to answer all questions appropriately regarding orientation. Echo done but results pending.  Continue aspirin.  Appreciate neurology help.  Hypertensive emergency: Acute.  Blood pressures elevated as high as 233/104. -Goal blood pressure less than 180.  Blood pressure improved and currently 152/83.  Continue current as needed medications.  Loose stool: Patient had one episode of type VII stool, checking C. difficile.  ESRD on HD: Patient normally dialyzes Monday, Wednesday, Friday.  Labs significant for potassium 4.4, BUN 73, and creatinine 10.85. only received  about 2 hours of his HD treatment today. -Nephrology consulted  Anemia of chronic disease due to ESRD: Hemoglobin is stable.  Monitor daily.  Diabetes Mellitus type 2: Volatile with some hypoglycemia -Hypoglycemic protocols -CBGs qAC and HS with Sensitive   DVT prophylaxis: heparin injection 5,000 Units Start: 06/16/20 1800   Code Status: Full  Code  Family Communication:  None present at bedside.   Status is: Observation  The patient will require care spanning > 2 midnights and should be moved to inpatient because: Ongoing diagnostic testing needed not appropriate for outpatient work up  Dispo: The patient is from: Home              Anticipated d/c is to: Home              Patient currently is not medically stable to d/c.   Difficult to place patient No        Estimated body mass index is 34.06 kg/m as calculated from the following:   Height as of this encounter: 6' (1.829 m).   Weight as of this encounter: 113.9 kg.      Nutritional status:               Consultants:   Neurology  Procedures:   None  Antimicrobials:  Anti-infectives (From admission, onward)   None         Subjective: Seen and examined.  Echo was being done when I saw him today.  Patient was fidgety throughout the echo.  Interestingly, he was able to answer all questions appropriately regarding orientation and denied having any complaint but kept his eyes closed.  Unusual presentation.  Noted that he had low-grade fever of 100.5 last night as well.  But no leukocytosis.  Objective: Vitals:   06/17/20 1103 06/17/20 1105 06/17/20 1130 06/17/20 1200  BP: (!) 175/91 (!) 173/86 (!) 156/85 (!) 161/75  Pulse: 92     Resp: 16 16    Temp: 98 F (36.7 C)     TempSrc: Oral     SpO2: 98%     Weight: 113.9 kg     Height: 6' (1.829 m)       Intake/Output Summary (Last 24 hours) at 06/17/2020 1249 Last data filed at 06/16/2020 1855 Gross per 24 hour  Intake --  Output 400 ml  Net -400 ml   Filed Weights   06/17/20 1103  Weight: 113.9 kg    Examination:  General exam: Appears fidgety Respiratory system: Clear to auscultation. Respiratory effort normal. Cardiovascular system: S1 & S2 heard, RRR. No JVD, murmurs, rubs, gallops or clicks. No pedal edema. Gastrointestinal system: Abdomen is nondistended, soft and nontender. No  organomegaly or masses felt. Normal bowel sounds heard. Central nervous system: Alert and oriented. No focal neurological deficits.  Keeps his eyes closed. Extremities: Symmetric 5 x 5 power. Skin: No rashes, lesions or ulcers    Data Reviewed: I have personally reviewed following labs and imaging studies  CBC: Recent Labs  Lab 06/16/20 1359 06/16/20 1420 06/17/20 0246  WBC 6.1  --  9.6  NEUTROABS 5.0  --   --   HGB 10.4* 10.5* 9.6*  HCT 30.6* 31.0* 27.5*  MCV 89.5  --  88.7  PLT 221  --  564   Basic Metabolic Panel: Recent Labs  Lab 06/16/20 1359 06/16/20 1420 06/17/20 0246  NA 134* 134* 136  K 4.4 4.5 4.3  CL 96* 100 98  CO2 24  --  22  GLUCOSE  126* 121* 75  BUN 73* 73* 80*  CREATININE 10.85* 11.10* 12.41*  CALCIUM 8.9  --  9.1  PHOS  --   --  9.2*   GFR: Estimated Creatinine Clearance: 8.5 mL/min (A) (by C-G formula based on SCr of 12.41 mg/dL (H)). Liver Function Tests: Recent Labs  Lab 06/16/20 1359 06/17/20 0246  AST 11*  --   ALT 16  --   ALKPHOS 63  --   BILITOT 0.4  --   PROT 7.2  --   ALBUMIN 3.6 3.3*   No results for input(s): LIPASE, AMYLASE in the last 168 hours. Recent Labs  Lab 06/16/20 1400  AMMONIA 36*   Coagulation Profile: Recent Labs  Lab 06/16/20 1359  INR 1.2   Cardiac Enzymes: No results for input(s): CKTOTAL, CKMB, CKMBINDEX, TROPONINI in the last 168 hours. BNP (last 3 results) No results for input(s): PROBNP in the last 8760 hours. HbA1C: Recent Labs    06/17/20 0246  HGBA1C 8.0*   CBG: Recent Labs  Lab 06/16/20 1250 06/16/20 1606 06/16/20 2140 06/17/20 0815 06/17/20 0905  GLUCAP 135* 104* 92 56* 99   Lipid Profile: No results for input(s): CHOL, HDL, LDLCALC, TRIG, CHOLHDL, LDLDIRECT in the last 72 hours. Thyroid Function Tests: No results for input(s): TSH, T4TOTAL, FREET4, T3FREE, THYROIDAB in the last 72 hours. Anemia Panel: No results for input(s): VITAMINB12, FOLATE, FERRITIN, TIBC, IRON,  RETICCTPCT in the last 72 hours. Sepsis Labs: No results for input(s): PROCALCITON, LATICACIDVEN in the last 168 hours.  Recent Results (from the past 240 hour(s))  Resp Panel by RT-PCR (Flu A&B, Covid) Nasopharyngeal Swab     Status: None   Collection Time: 06/16/20 12:44 PM   Specimen: Nasopharyngeal Swab; Nasopharyngeal(NP) swabs in vial transport medium  Result Value Ref Range Status   SARS Coronavirus 2 by RT PCR NEGATIVE NEGATIVE Final    Comment: (NOTE) SARS-CoV-2 target nucleic acids are NOT DETECTED.  The SARS-CoV-2 RNA is generally detectable in upper respiratory specimens during the acute phase of infection. The lowest concentration of SARS-CoV-2 viral copies this assay can detect is 138 copies/mL. A negative result does not preclude SARS-Cov-2 infection and should not be used as the sole basis for treatment or other patient management decisions. A negative result may occur with  improper specimen collection/handling, submission of specimen other than nasopharyngeal swab, presence of viral mutation(s) within the areas targeted by this assay, and inadequate number of viral copies(<138 copies/mL). A negative result must be combined with clinical observations, patient history, and epidemiological information. The expected result is Negative.  Fact Sheet for Patients:  EntrepreneurPulse.com.au  Fact Sheet for Healthcare Providers:  IncredibleEmployment.be  This test is no t yet approved or cleared by the [place] FDA and  has been authorized for detection and/or diagnosis of SARS-CoV-2 by FDA under an Emergency Use Authorization (EUA). This EUA will remain  in effect (meaning this test can be used) for the duration of the COVID-19 declaration under Section 564(b)(1) of the Act, 21 U.S.C.section 360bbb-3(b)(1), unless the authorization is terminated  or revoked sooner.       Influenza A by PCR NEGATIVE NEGATIVE Final   Influenza  B by PCR NEGATIVE NEGATIVE Final    Comment: (NOTE) The Xpert Xpress SARS-CoV-2/FLU/RSV plus assay is intended as an aid in the diagnosis of influenza from Nasopharyngeal swab specimens and should not be used as a sole basis for treatment. Nasal washings and aspirates are unacceptable for Xpert Xpress SARS-CoV-2/FLU/RSV testing.  Fact  Sheet for Patients: EntrepreneurPulse.com.au  Fact Sheet for Healthcare Providers: IncredibleEmployment.be  This test is not yet approved or cleared by the [place] FDA and has been authorized for detection and/or diagnosis of SARS-CoV-2 by FDA under an Emergency Use Authorization (EUA). This EUA will remain in effect (meaning this test can be used) for the duration of the COVID-19 declaration under Section 564(b)(1) of the Act, 21 U.S.C. section 360bbb-3(b)(1), unless the authorization is terminated or revoked.  Performed at [facility], [place] [address] [place], [place] [address]       Radiology Studies: CT Code Stroke CTA Head W/WO contrast  Result Date: 06/16/2020 CLINICAL DATA:  Focal neuro deficit, greater than 6 hours, stroke suspected. Aphasia, altered mental status EXAM: CT ANGIOGRAPHY HEAD AND NECK TECHNIQUE: Multidetector CT imaging of the head and neck was performed using the standard protocol during bolus administration of intravenous contrast. Multiplanar CT image reconstructions and MIPs were obtained to evaluate the vascular anatomy. Carotid stenosis measurements (when applicable) are obtained utilizing NASCET criteria, using the distal internal carotid diameter as the denominator. CONTRAST:  9mL OMNIPAQUE IOHEXOL 350 MG/ML SOLN COMPARISON:  Noncontrast head CT performed earlier today 06/16/2020. MRI/MRA head 05/03/2020. MRA neck 05/03/2020. FINDINGS: CTA NECK FINDINGS Aortic arch: Standard aortic branching. The visualized aortic arch is unremarkable. Minimal atherosclerotic plaque within the  proximal left subclavian artery. No hemodynamically significant innominate or proximal subclavian artery stenosis. Right carotid system: CCA and ICA patent within the neck without stenosis. Minimal atherosclerotic plaque within the carotid bifurcation. Tortuosity of the cervical ICA. Left carotid system: CCA and ICA patent within the neck without stenosis. Mild predominantly soft plaque within the mid to distal ICA. Vertebral arteries: Codominant and patent within the neck without stenosis. Skeleton: No acute bony abnormality or aggressive osseous lesion. Cervical spondylosis with multilevel disc space narrowing, disc bulges, posterior disc osteophytes, uncovertebral hypertrophy. Disc space narrowing is greatest at C5-C6 and C6-C7 (advanced at these levels). Other neck: No neck mass or cervical lymphadenopathy. Upper chest: No consolidation within the imaged lung apices. Review of the MIP images confirms the above findings CTA HEAD FINDINGS Anterior circulation: The intracranial internal carotid arteries are patent. Nonstenotic calcified plaque within these vessels bilaterally. The M1 middle cerebral arteries are patent. No M2 proximal branch occlusion or high-grade proximal stenosis is identified. The anterior cerebral arteries are patent. No intracranial aneurysm is identified. Posterior circulation: The intracranial vertebral arteries are patent. Mild nonstenotic calcified plaque within both V4 segments. The basilar artery is patent. The posterior cerebral arteries are patent. A right posterior communicating artery is present. The left posterior communicating artery is hypoplastic or absent. Venous sinuses: Within the limitations of contrast timing, no convincing thrombus. Anatomic variants: As described Review of the MIP images confirms the above findings No large vessel occlusion. These results were communicated to Dr. Leonel Ramsay at 1:50 pmon 2/28/2022by text page via the [facility] messaging system. IMPRESSION:  CTA neck: Common carotid, internal carotid and vertebral arteries patent within the neck without hemodynamically significant stenosis. Mild atherosclerotic disease within the carotid systems as described. CTA head: 1. No intracranial large vessel occlusion or proximal high-grade arterial stenosis. 2. Nonstenotic calcified plaque within the intracranial internal carotid arteries and V4 vertebral arteries bilaterally. Electronically Signed   By: Kellie Simmering DO   On: 06/16/2020 13:52   CT Code Stroke CTA Neck W/WO contrast  Result Date: 06/16/2020 CLINICAL DATA:  Focal neuro deficit, greater than 6 hours, stroke suspected. Aphasia, altered mental status EXAM: CT  ANGIOGRAPHY HEAD AND NECK TECHNIQUE: Multidetector CT imaging of the head and neck was performed using the standard protocol during bolus administration of intravenous contrast. Multiplanar CT image reconstructions and MIPs were obtained to evaluate the vascular anatomy. Carotid stenosis measurements (when applicable) are obtained utilizing NASCET criteria, using the distal internal carotid diameter as the denominator. CONTRAST:  69mL OMNIPAQUE IOHEXOL 350 MG/ML SOLN COMPARISON:  Noncontrast head CT performed earlier today 06/16/2020. MRI/MRA head 05/03/2020. MRA neck 05/03/2020. FINDINGS: CTA NECK FINDINGS Aortic arch: Standard aortic branching. The visualized aortic arch is unremarkable. Minimal atherosclerotic plaque within the proximal left subclavian artery. No hemodynamically significant innominate or proximal subclavian artery stenosis. Right carotid system: CCA and ICA patent within the neck without stenosis. Minimal atherosclerotic plaque within the carotid bifurcation. Tortuosity of the cervical ICA. Left carotid system: CCA and ICA patent within the neck without stenosis. Mild predominantly soft plaque within the mid to distal ICA. Vertebral arteries: Codominant and patent within the neck without stenosis. Skeleton: No acute bony abnormality or  aggressive osseous lesion. Cervical spondylosis with multilevel disc space narrowing, disc bulges, posterior disc osteophytes, uncovertebral hypertrophy. Disc space narrowing is greatest at C5-C6 and C6-C7 (advanced at these levels). Other neck: No neck mass or cervical lymphadenopathy. Upper chest: No consolidation within the imaged lung apices. Review of the MIP images confirms the above findings CTA HEAD FINDINGS Anterior circulation: The intracranial internal carotid arteries are patent. Nonstenotic calcified plaque within these vessels bilaterally. The M1 middle cerebral arteries are patent. No M2 proximal branch occlusion or high-grade proximal stenosis is identified. The anterior cerebral arteries are patent. No intracranial aneurysm is identified. Posterior circulation: The intracranial vertebral arteries are patent. Mild nonstenotic calcified plaque within both V4 segments. The basilar artery is patent. The posterior cerebral arteries are patent. A right posterior communicating artery is present. The left posterior communicating artery is hypoplastic or absent. Venous sinuses: Within the limitations of contrast timing, no convincing thrombus. Anatomic variants: As described Review of the MIP images confirms the above findings No large vessel occlusion. These results were communicated to Dr. Leonel Ramsay at 1:50 pmon 2/28/2022by text page via the [facility] messaging system. IMPRESSION: CTA neck: Common carotid, internal carotid and vertebral arteries patent within the neck without hemodynamically significant stenosis. Mild atherosclerotic disease within the carotid systems as described. CTA head: 1. No intracranial large vessel occlusion or proximal high-grade arterial stenosis. 2. Nonstenotic calcified plaque within the intracranial internal carotid arteries and V4 vertebral arteries bilaterally. Electronically Signed   By: Kellie Simmering DO   On: 06/16/2020 13:52   EEG adult  Result Date: 06/16/2020 Lora Havens, MD     06/16/2020  7:13 PM Patient Name: Scott Castillo MRN: [id number] Epilepsy Attending: Lora Havens Referring Physician/Provider: Dr Meredith Staggers Dagar Date: 06/16/2020 Duration: 22.42 mins Patient history: 60 y.o. male with PMHx of HLD on CKD, ESRD, diabetes mellitus type 2, hyperlipidemia, anemia of chronic disease, venous stasis dermatitis of both lower extremities presented with expressive aphasia and altered mental status post hemodialysis. EEG to evaluate for seizure Level of alertness: awake AEDs during EEG study: None Technical aspects: This EEG study was done with scalp electrodes positioned according to the 10-20 International system of electrode placement. Electrical activity was acquired at a sampling rate of 500Hz  and reviewed with a high frequency filter of 70Hz  and a low frequency filter of 1Hz . EEG data were recorded continuously and digitally stored. Description: No posterior dominant rhythm was seen.  EEG showed continuous generalized 3  to 6 Hz theta-delta slowing. Hyperventilation and photic stimulation were not performed.   ABNORMALITY -Continuous slow, generalized IMPRESSION: This study is suggestive of moderate diffuse encephalopathy, nonspecific etiology. No seizures or epileptiform discharges were seen throughout the recording. Lora Havens   ECHOCARDIOGRAM COMPLETE  Result Date: 06/17/2020    ECHOCARDIOGRAM REPORT   Patient Name:   Scott Castillo Date of Exam: 06/17/2020 Medical Rec #:  [id number]       Height:       72.0 in Accession #:    [id number]      Weight:       255.5 lb Date of Birth:  06-May-1960       BSA:          2.364 m Patient Age:    23 years        BP:           142/94 mmHg Patient Gender: M               HR:           106 bpm. Exam Location:  Inpatient Procedure: 2D Echo, Cardiac Doppler and Color Doppler Indications:    Stroke I63.9  History:        Patient has prior history of Echocardiogram examinations, most                 recent 07/05/2016.   Sonographer:    Bernadene Person RDCS Referring Phys: [id number] Scott Castillo  Sonographer Comments: Image acquisition challenging due to uncooperative patient. IMPRESSIONS  1. Prominent trabeculations in LV apex cannot r/o ventricular non compaction consider f/u cardiac MRI if clinicially indicated There is no clear indication for anticoagulation for non compaction in absence of LV dsyfunction or PaF . Left ventricular ejection fraction, by estimation, is 55 to 60%. The left ventricle has normal function. The left ventricle has no regional wall motion abnormalities. Left ventricular diastolic parameters were normal.  2. Right ventricular systolic function is normal. The right ventricular size is normal.  3. Left atrial size was moderately dilated.  4. The mitral valve is abnormal. Mild mitral valve regurgitation. No evidence of mitral stenosis.  5. The aortic valve is tricuspid. There is moderate calcification of the aortic valve. There is moderate thickening of the aortic valve. Aortic valve regurgitation is trivial. Mild to moderate aortic valve sclerosis/calcification is present, without any  evidence of aortic stenosis.  6. The inferior vena cava is normal in size with greater than 50% respiratory variability, suggesting right atrial pressure of 3 mmHg. FINDINGS  Left Ventricle: Prominent trabeculations in LV apex cannot r/o ventricular non compaction consider f/u cardiac MRI if clinicially indicated There is no clear indication for anticoagulation for non compaction in absence of LV dsyfunction or PaF. Left ventricular ejection fraction, by estimation, is 55 to 60%. The left ventricle has normal function. The left ventricle has no regional wall motion abnormalities. The left ventricular internal cavity size was normal in size. There is no left ventricular hypertrophy. Left ventricular diastolic parameters were normal. Right Ventricle: The right ventricular size is normal. No increase in right ventricular wall  thickness. Right ventricular systolic function is normal. Left Atrium: Left atrial size was moderately dilated. Right Atrium: Right atrial size was normal in size. Pericardium: There is no evidence of pericardial effusion. Mitral Valve: The mitral valve is abnormal. There is moderate thickening of the mitral valve leaflet(s). There is mild calcification of the mitral valve leaflet(s). Mild mitral valve regurgitation.  No evidence of mitral valve stenosis. Tricuspid Valve: The tricuspid valve is normal in structure. Tricuspid valve regurgitation is not demonstrated. No evidence of tricuspid stenosis. Aortic Valve: The aortic valve is tricuspid. There is moderate calcification of the aortic valve. There is moderate thickening of the aortic valve. Aortic valve regurgitation is trivial. Mild to moderate aortic valve sclerosis/calcification is present, without any evidence of aortic stenosis. Pulmonic Valve: The pulmonic valve was normal in structure. Pulmonic valve regurgitation is not visualized. No evidence of pulmonic stenosis. Aorta: The aortic root is normal in size and structure. Venous: The inferior vena cava is normal in size with greater than 50% respiratory variability, suggesting right atrial pressure of 3 mmHg. IAS/Shunts: No atrial level shunt detected by color flow Doppler.  LEFT VENTRICLE PLAX 2D LVIDd:         5.40 cm  Diastology LVIDs:         3.80 cm  LV e' medial:    7.72 cm/s LV PW:         0.90 cm  LV E/e' medial:  12.7 LV IVS:        1.00 cm  LV e' lateral:   9.25 cm/s LVOT diam:     1.90 cm  LV E/e' lateral: 10.6 LV SV:         61 LV SV Index:   26 LVOT Area:     2.84 cm  RIGHT VENTRICLE RV S prime:     10.40 cm/s TAPSE (M-mode): 2.5 cm LEFT ATRIUM             Index       RIGHT ATRIUM           Index LA diam:        4.70 cm 1.99 cm/m  RA Area:     14.00 cm LA Vol (A2C):   71.5 ml 30.24 ml/m RA Volume:   25.40 ml  10.74 ml/m LA Vol (A4C):   67.2 ml 28.42 ml/m LA Biplane Vol: 70.2 ml 29.69  ml/m  AORTIC VALVE LVOT Vmax:   95.30 cm/s LVOT Vmean:  72.700 cm/s LVOT VTI:    0.215 m  AORTA Ao Root diam: 3.70 cm Ao Asc diam:  3.60 cm MITRAL VALVE MV Area (PHT): 2.97 cm    SHUNTS MV Decel Time: 255 msec    Systemic VTI:  0.22 m MV E velocity: 97.90 cm/s  Systemic Diam: 1.90 cm MV A velocity: 99.00 cm/s MV E/A ratio:  0.99 Jenkins Rouge MD Electronically signed by Jenkins Rouge MD Signature Date/Time: 06/17/2020/10:50:32 AM    Final    CT HEAD CODE STROKE WO CONTRAST  Result Date: 06/16/2020 CLINICAL DATA:  Code stroke.  Neuro deficit, acute, stroke suspected EXAM: CT HEAD WITHOUT CONTRAST TECHNIQUE: Contiguous axial images were obtained from the base of the skull through the vertex without intravenous contrast. COMPARISON:  05/09/2020 and prior. FINDINGS: Please note that image quality is degraded by motion artifact. Brain: No acute infarct or intracranial hemorrhage. No mass lesion. No midline shift, ventriculomegaly or extra-axial fluid collection. Mild cerebral atrophy with ex vacuo dilatation. Small right insular hypodensity. Vascular: No hyperdense vessel or unexpected calcification. Bilateral skull base atherosclerotic calcifications. Skull: Negative for fracture or focal lesion. Sinuses/Orbits: No acute finding. Other: None. ASPECTS [facility] Stroke Program Early CT Score) - Ganglionic level infarction (caudate, lentiform nuclei, internal capsule, insula, M1-M3 cortex): 6 - Supraganglionic infarction (M4-M6 cortex): 3 Total score (0-10 with 10 being normal): 9 IMPRESSION: 1. Small right  insular hypodensity may reflect age indeterminate lacunar insult versus prominent sulcus. Motion degraded exam. 2. Mild cerebral atrophy. 3. ASPECTS is 9 4. Code stroke imaging results were communicated on 06/16/2020 at 1:18 pm to provider Dr. Leonel Ramsay via secure text paging. Electronically Signed   By: Primitivo Gauze M.D.   On: 06/16/2020 13:21    Scheduled Meds: . aspirin EC  81 mg Oral Daily  .  Chlorhexidine Gluconate Cloth  6 each Topical Q0600  . heparin  5,000 Units Subcutaneous Q8H  . insulin aspart  0-5 Units Subcutaneous QHS  . insulin aspart  0-9 Units Subcutaneous TID WC  . sodium chloride flush  3 mL Intravenous Q12H   Continuous Infusions: . sodium chloride    . sodium chloride       LOS: 0 days   Time spent: 37 minutes   Darliss Cheney, MD [facility]  06/17/2020, 12:49 PM   To contact the attending provider between 7A-7P or the covering provider during after hours 7P-7A, please log into the web site [email or web address].

## 2020-06-17 NOTE — Progress Notes (Signed)
Subjective: Patient is markedly improved, though still agitated per nursing overnight.  Exam: Vitals:   06/17/20 0323 06/17/20 0818  BP: (!) 163/77 (!) 142/94  Pulse: 99 82  Resp: 20 16  Temp: 99.9 F (37.7 C) 98.6 F (37 C)  SpO2: 95% 100%   Gen: In bed, NAD Resp: non-labored breathing, no acute distress Abd: soft, nt  Neuro: MS: Awakens to voice, but lethargic knows month, year, able to follow commands and answer simple questions.  DS:WVTVN, ? Left field cut though unreliable with answers Motor: in 4 point restraints, but moving all extremities well.  Sensory:intact to LT, no extinction to DSS  Pertinent Labs: Ammonia 36 UA, ? UTI BUN 73 UDS negative EEG no epileptiform changes.   Impression: 60 yo M with recurrent episodes of speech difficulty, bilateral numbness after dialysis. The reliability of the symptoms associated with dialysis over the last month is very unusual. With his current presentation, I would suspect PRES if not for the description of the antecedant spells. I would not expect hypotension to cause such focal symptoms in the absence of a focal stenosis.   Given the reliable occurrence with dialysis, may need very conservative dialysis as I think that dialysis dysequilibrium syndrome is certainly a possibility.   Recommendations: 1) MRI brain 2) TSH 3) consider relaxing restraints now that he is redirectable.   Roland Rack, MD [facility] [phone]  If 7pm- 7am, please page neurology on call as listed in [place].

## 2020-06-17 NOTE — Progress Notes (Signed)
Subjective:  Was reportedly more alert earlier this AM-  More coherent but then got another round of haldol/ativan so sleepy again-  Restrained   Objective Vital signs in last 24 hours: Vitals:   06/16/20 2305 06/17/20 0122 06/17/20 0323 06/17/20 0818  BP: (!) 186/63 (!) 158/79 (!) 163/77 (!) 142/94  Pulse: (!) 109 100 99 82  Resp: (!) 22 (!) 21 20 16   Temp: (!) 100.5 F (38.1 C) 99.1 F (37.3 C) 99.9 F (37.7 C) 98.6 F (37 C)  TempSrc: Axillary Axillary Axillary Oral  SpO2: 98% 98% 95% 100%   Weight change:   Intake/Output Summary (Last 24 hours) at 06/17/2020 0844 Last data filed at 06/16/2020 1855 Gross per 24 hour  Intake -  Output 400 ml  Net -400 ml   Dialysis Orders:  MWF  - [facility] [place]  4:30hrs, BFR 450, DFR 500,  EDW 111.5kg, 2K/ 2.25Ca-  Been signing off the majority of treatments of late  Access: L AVF Calcitriol 2 mcg PO qHD-last 06/16/20 Scott Castillo 210mg  2 tablets TID with meals  Last Labs: Hgb 10.5, TSAT 26 (05/28/20 OP HD), K 4.5, Ca 8.9, P 8.6 (05/28/20 OP HD), PTH 587 (05/28/20 OP HD)   Assessment/ Plan: Pt is a 60 y.o. yo male with ESRD, DM, HTN who was admitted on 06/16/2020 with confusion after HD  Assessment/Plan: 1. MS change after HD-  Difficult to tease out etiology, has been having numbness and difficulty with speech during HD that lasts for a few hours but MS change was much more pronounced with HD yesterday-  BP does not drop with HD-  Actually the opposite.  I was concerned about hypertensive encephalopathy vs dialysis dysequilibruim.  BP controlled with IV agents overnight and MS improved.  Now sedating meds are clouding the picture.   2. ESRD - currently sedated and had not had effective HD due to sign offs-  Will take this opportunity to do very gentle HD to see if he will tolerate it better - try to get the BUN down 3. Anemia- will add  ESA 4. Secondary hyperparathyroidism-  Continue home calcitriol and  binders when able to take POs 5.  HTN/volume-  BP has been very high-  For now using PRN IV agents until alert enough to take [place]: Basic Metabolic Panel: Recent Labs  Lab 06/16/20 1359 06/16/20 1420 06/17/20 0246  NA 134* 134* 136  K 4.4 4.5 4.3  CL 96* 100 98  CO2 24  --  22  GLUCOSE 126* 121* 75  BUN 73* 73* 80*  CREATININE 10.85* 11.10* 12.41*  CALCIUM 8.9  --  9.1  PHOS  --   --  9.2*   Liver Function Tests: Recent Labs  Lab 06/16/20 1359 06/17/20 0246  AST 11*  --   ALT 16  --   ALKPHOS 63  --   BILITOT 0.4  --   PROT 7.2  --   ALBUMIN 3.6 3.3*   No results for input(s): LIPASE, AMYLASE in the last 168 hours. Recent Labs  Lab 06/16/20 1400  AMMONIA 36*   CBC: Recent Labs  Lab 06/16/20 1359 06/16/20 1420 06/17/20 0246  WBC 6.1  --  9.6  NEUTROABS 5.0  --   --   HGB 10.4* 10.5* 9.6*  HCT 30.6* 31.0* 27.5*  MCV 89.5  --  88.7  PLT 221  --  231   Cardiac Enzymes: No results for input(s): CKTOTAL,  CKMB, CKMBINDEX, TROPONINI in the last 168 hours. CBG: Recent Labs  Lab 06/16/20 1250 06/16/20 1606 06/16/20 2140 06/17/20 0815  GLUCAP 135* 104* 92 56*    Iron Studies: No results for input(s): IRON, TIBC, TRANSFERRIN, FERRITIN in the last 72 hours. Studies/Results: CT Code Stroke CTA Head W/WO contrast  Result Date: 06/16/2020 CLINICAL DATA:  Focal neuro deficit, greater than 6 hours, stroke suspected. Aphasia, altered mental status EXAM: CT ANGIOGRAPHY HEAD AND NECK TECHNIQUE: Multidetector CT imaging of the head and neck was performed using the standard protocol during bolus administration of intravenous contrast. Multiplanar CT image reconstructions and MIPs were obtained to evaluate the vascular anatomy. Carotid stenosis measurements (when applicable) are obtained utilizing NASCET criteria, using the distal internal carotid diameter as the denominator. CONTRAST:  47mL OMNIPAQUE IOHEXOL 350 MG/ML SOLN COMPARISON:  Noncontrast head CT performed earlier  today 06/16/2020. MRI/MRA head 05/03/2020. MRA neck 05/03/2020. FINDINGS: CTA NECK FINDINGS Aortic arch: Standard aortic branching. The visualized aortic arch is unremarkable. Minimal atherosclerotic plaque within the proximal left subclavian artery. No hemodynamically significant innominate or proximal subclavian artery stenosis. Right carotid system: CCA and ICA patent within the neck without stenosis. Minimal atherosclerotic plaque within the carotid bifurcation. Tortuosity of the cervical ICA. Left carotid system: CCA and ICA patent within the neck without stenosis. Mild predominantly soft plaque within the mid to distal ICA. Vertebral arteries: Codominant and patent within the neck without stenosis. Skeleton: No acute bony abnormality or aggressive osseous lesion. Cervical spondylosis with multilevel disc space narrowing, disc bulges, posterior disc osteophytes, uncovertebral hypertrophy. Disc space narrowing is greatest at C5-C6 and C6-C7 (advanced at these levels). Other neck: No neck mass or cervical lymphadenopathy. Upper chest: No consolidation within the imaged lung apices. Review of the MIP images confirms the above findings CTA HEAD FINDINGS Anterior circulation: The intracranial internal carotid arteries are patent. Nonstenotic calcified plaque within these vessels bilaterally. The M1 middle cerebral arteries are patent. No M2 proximal branch occlusion or high-grade proximal stenosis is identified. The anterior cerebral arteries are patent. No intracranial aneurysm is identified. Posterior circulation: The intracranial vertebral arteries are patent. Mild nonstenotic calcified plaque within both V4 segments. The basilar artery is patent. The posterior cerebral arteries are patent. A right posterior communicating artery is present. The left posterior communicating artery is hypoplastic or absent. Venous sinuses: Within the limitations of contrast timing, no convincing thrombus. Anatomic variants: As  described Review of the MIP images confirms the above findings No large vessel occlusion. These results were communicated to Dr. Leonel Castillo at 1:50 pmon 2/28/2022by text page via the [facility] messaging system. IMPRESSION: CTA neck: Common carotid, internal carotid and vertebral arteries patent within the neck without hemodynamically significant stenosis. Mild atherosclerotic disease within the carotid systems as described. CTA head: 1. No intracranial large vessel occlusion or proximal high-grade arterial stenosis. 2. Nonstenotic calcified plaque within the intracranial internal carotid arteries and V4 vertebral arteries bilaterally. Electronically Signed   By: Kellie Simmering DO   On: 06/16/2020 13:52   CT Code Stroke CTA Neck W/WO contrast  Result Date: 06/16/2020 CLINICAL DATA:  Focal neuro deficit, greater than 6 hours, stroke suspected. Aphasia, altered mental status EXAM: CT ANGIOGRAPHY HEAD AND NECK TECHNIQUE: Multidetector CT imaging of the head and neck was performed using the standard protocol during bolus administration of intravenous contrast. Multiplanar CT image reconstructions and MIPs were obtained to evaluate the vascular anatomy. Carotid stenosis measurements (when applicable) are obtained utilizing NASCET criteria, using the distal internal carotid diameter as  the denominator. CONTRAST:  2mL OMNIPAQUE IOHEXOL 350 MG/ML SOLN COMPARISON:  Noncontrast head CT performed earlier today 06/16/2020. MRI/MRA head 05/03/2020. MRA neck 05/03/2020. FINDINGS: CTA NECK FINDINGS Aortic arch: Standard aortic branching. The visualized aortic arch is unremarkable. Minimal atherosclerotic plaque within the proximal left subclavian artery. No hemodynamically significant innominate or proximal subclavian artery stenosis. Right carotid system: CCA and ICA patent within the neck without stenosis. Minimal atherosclerotic plaque within the carotid bifurcation. Tortuosity of the cervical ICA. Left carotid system: CCA and  ICA patent within the neck without stenosis. Mild predominantly soft plaque within the mid to distal ICA. Vertebral arteries: Codominant and patent within the neck without stenosis. Skeleton: No acute bony abnormality or aggressive osseous lesion. Cervical spondylosis with multilevel disc space narrowing, disc bulges, posterior disc osteophytes, uncovertebral hypertrophy. Disc space narrowing is greatest at C5-C6 and C6-C7 (advanced at these levels). Other neck: No neck mass or cervical lymphadenopathy. Upper chest: No consolidation within the imaged lung apices. Review of the MIP images confirms the above findings CTA HEAD FINDINGS Anterior circulation: The intracranial internal carotid arteries are patent. Nonstenotic calcified plaque within these vessels bilaterally. The M1 middle cerebral arteries are patent. No M2 proximal branch occlusion or high-grade proximal stenosis is identified. The anterior cerebral arteries are patent. No intracranial aneurysm is identified. Posterior circulation: The intracranial vertebral arteries are patent. Mild nonstenotic calcified plaque within both V4 segments. The basilar artery is patent. The posterior cerebral arteries are patent. A right posterior communicating artery is present. The left posterior communicating artery is hypoplastic or absent. Venous sinuses: Within the limitations of contrast timing, no convincing thrombus. Anatomic variants: As described Review of the MIP images confirms the above findings No large vessel occlusion. These results were communicated to Dr. Leonel Castillo at 1:50 pmon 2/28/2022by text page via the [facility] messaging system. IMPRESSION: CTA neck: Common carotid, internal carotid and vertebral arteries patent within the neck without hemodynamically significant stenosis. Mild atherosclerotic disease within the carotid systems as described. CTA head: 1. No intracranial large vessel occlusion or proximal high-grade arterial stenosis. 2. Nonstenotic  calcified plaque within the intracranial internal carotid arteries and V4 vertebral arteries bilaterally. Electronically Signed   By: Kellie Simmering DO   On: 06/16/2020 13:52   EEG adult  Result Date: 06/16/2020 Lora Havens, MD     06/16/2020  7:13 PM Patient Name: Scott Castillo MRN: [id number] Epilepsy Attending: Lora Havens Referring Physician/Provider: Dr Meredith Staggers Dagar Date: 06/16/2020 Duration: 22.42 mins Patient history: 60 y.o. male with PMHx of HLD on CKD, ESRD, diabetes mellitus type 2, hyperlipidemia, anemia of chronic disease, venous stasis dermatitis of both lower extremities presented with expressive aphasia and altered mental status post hemodialysis. EEG to evaluate for seizure Level of alertness: awake AEDs during EEG study: None Technical aspects: This EEG study was done with scalp electrodes positioned according to the 10-20 International system of electrode placement. Electrical activity was acquired at a sampling rate of 500Hz  and reviewed with a high frequency filter of 70Hz  and a low frequency filter of 1Hz . EEG data were recorded continuously and digitally stored. Description: No posterior dominant rhythm was seen.  EEG showed continuous generalized 3 to 6 Hz theta-delta slowing. Hyperventilation and photic stimulation were not performed.   ABNORMALITY -Continuous slow, generalized IMPRESSION: This study is suggestive of moderate diffuse encephalopathy, nonspecific etiology. No seizures or epileptiform discharges were seen throughout the recording. Lora Havens   CT HEAD CODE STROKE WO CONTRAST  Result Date: 06/16/2020 CLINICAL  DATA:  Code stroke.  Neuro deficit, acute, stroke suspected EXAM: CT HEAD WITHOUT CONTRAST TECHNIQUE: Contiguous axial images were obtained from the base of the skull through the vertex without intravenous contrast. COMPARISON:  05/09/2020 and prior. FINDINGS: Please note that image quality is degraded by motion artifact. Brain: No acute infarct or  intracranial hemorrhage. No mass lesion. No midline shift, ventriculomegaly or extra-axial fluid collection. Mild cerebral atrophy with ex vacuo dilatation. Small right insular hypodensity. Vascular: No hyperdense vessel or unexpected calcification. Bilateral skull base atherosclerotic calcifications. Skull: Negative for fracture or focal lesion. Sinuses/Orbits: No acute finding. Other: None. ASPECTS [facility] Stroke Program Early CT Score) - Ganglionic level infarction (caudate, lentiform nuclei, internal capsule, insula, M1-M3 cortex): 6 - Supraganglionic infarction (M4-M6 cortex): 3 Total score (0-10 with 10 being normal): 9 IMPRESSION: 1. Small right insular hypodensity may reflect age indeterminate lacunar insult versus prominent sulcus. Motion degraded exam. 2. Mild cerebral atrophy. 3. ASPECTS is 9 4. Code stroke imaging results were communicated on 06/16/2020 at 1:18 pm to provider Dr. Leonel Castillo via secure text paging. Electronically Signed   By: Primitivo Gauze M.D.   On: 06/16/2020 13:21   Medications: Infusions:   Scheduled Medications: . aspirin EC  81 mg Oral Daily  . heparin  5,000 Units Subcutaneous Q8H  . insulin aspart  0-5 Units Subcutaneous QHS  . insulin aspart  0-9 Units Subcutaneous TID WC  . sodium chloride flush  3 mL Intravenous Q12H    have reviewed scheduled and prn medications.  Physical Exam: General: restless, then sleepy-  Knows hospital  Heart: RRR Lungs: mostly clear Abdomen: obese, soft, non tender Extremities: min edema Dialysis Access: left AVF-  Patent     06/17/2020,8:44 AM  LOS: 0 days

## 2020-06-17 NOTE — Evaluation (Signed)
Clinical/Bedside Swallow Evaluation Patient Details  Name: Scott Castillo MRN: [id number] Date of Birth: 1960-06-18  Today's Date: 06/17/2020 Time: SLP Start Time (ACUTE ONLY): 0843 SLP Stop Time (ACUTE ONLY): 0905 SLP Time Calculation (min) (ACUTE ONLY): 22 min  Past Medical History:  Past Medical History:  Diagnosis Date  . Venous stasis dermatitis of both lower extremities    Past Surgical History:  Past Surgical History:  Procedure Laterality Date  . A/V FISTULAGRAM Left 09/27/2019   Procedure: A/V FISTULAGRAM;  Surgeon: Marty Heck, MD;  Location: [facility];  Service: Cardiovascular;  Laterality: Left;  . AV FISTULA PLACEMENT Left 09/17/2016   Procedure: ARTERIOVENOUS (AV) FISTULA CREATION;  Surgeon: [name], MD;  Location: [facility];  Service: Vascular;  Laterality: Left;  . ENDOVENOUS ABLATION SAPHENOUS VEIN W/ LASER Right 04/08/2016   EVLA R greater saphenous vein by Curt Jews MD  . ENDOVENOUS ABLATION SAPHENOUS VEIN W/ LASER Left 05/13/2016   endovenous laser ablation (left greater saphenous vein) by Curt Jews MD    HPI:  60 y.o. male with PMHx of HLD on CKD, ESRD, diabetes mellitus type 2, hyperlipidemia, anemia of chronic disease, venous stasis dermatitis of both lower extremities presented with expressive aphasia and altered mental status post hemodialysis   Assessment / Plan / Recommendation Clinical Impression  Pts swallow abilities appeared within functional limits. Pt sleeping upon SLP entrance, able to arouse with consisent cues (Per RN meds recently administered including sedative meds). Performed oral care as xerostomia and dried secretions noted throughout oral cavity. Oral hygiene greatly improved. Pt with missing dentition at baseline, denies hx of dysphagia. Pt consumed ice chips, thin, puree, and solid PO without overt s/sx of aspiration. Bolus transit appeared mildly delayed. Recommend regular thin liquid diet with meds as tolerated. No further  ST needs identified.  SLP Visit Diagnosis: Dysphagia, unspecified (R13.10)    Aspiration Risk  Mild aspiration risk    Diet Recommendation   Regular thin liquids  Medication Administration: Whole meds with liquid    Other  Recommendations Oral Care Recommendations: Oral care BID   Follow up Recommendations 24 hour supervision/assistance      Frequency and Duration   n/a         Prognosis        Swallow Study   General Date of Onset: 06/16/20 HPI: 60 y.o. male with PMHx of HLD on CKD, ESRD, diabetes mellitus type 2, hyperlipidemia, anemia of chronic disease, venous stasis dermatitis of both lower extremities presented with expressive aphasia and altered mental status post hemodialysis Type of Study: Bedside Swallow Evaluation Previous Swallow Assessment: none on file Diet Prior to this Study: NPO Temperature Spikes Noted: No Respiratory Status: Room air History of Recent Intubation: No Behavior/Cognition: Requires cueing;Cooperative Oral Cavity Assessment: Dry;Dried secretions Oral Care Completed by SLP: Yes Oral Cavity - Dentition: Missing dentition Vision: Functional for self-feeding Self-Feeding Abilities: Needs assist Patient Positioning: Upright in bed Baseline Vocal Quality: Normal Volitional Swallow: Able to elicit    Oral/Motor/Sensory Function Overall Oral Motor/Sensory Function: Within functional limits   Ice Chips Ice chips: Within functional limits   Thin Liquid Thin Liquid: Within functional limits Presentation: Cup;Straw    Nectar Thick Nectar Thick Liquid: Not tested   Honey Thick Honey Thick Liquid: Not tested   Puree Puree: Within functional limits Presentation: Spoon   Solid     Solid: Within functional limits     Hayden Rasmussen MA, CCC-SLP Acute Rehabilitation Services   06/17/2020,9:12 AM

## 2020-06-18 DIAGNOSIS — G9341 Metabolic encephalopathy: Secondary | ICD-10-CM | POA: Diagnosis not present

## 2020-06-18 DIAGNOSIS — I160 Hypertensive urgency: Secondary | ICD-10-CM | POA: Diagnosis not present

## 2020-06-18 LAB — GLUCOSE, CAPILLARY
Glucose-Capillary: 65 mg/dL — ABNORMAL LOW (ref 70–99)
Glucose-Capillary: 119 mg/dL — ABNORMAL HIGH (ref 70–99)
Glucose-Capillary: 63 mg/dL — ABNORMAL LOW (ref 70–99)
Glucose-Capillary: 154 mg/dL — ABNORMAL HIGH (ref 70–99)
Glucose-Capillary: 131 mg/dL — ABNORMAL HIGH (ref 70–99)
Glucose-Capillary: 293 mg/dL — ABNORMAL HIGH (ref 70–99)

## 2020-06-18 LAB — CBC WITH DIFFERENTIAL/PLATELET
Abs Immature Granulocytes: 0.02 10*3/uL (ref 0.00–0.07)
Basophils Absolute: 0 10*3/uL (ref 0.0–0.1)
Basophils Relative: 1 %
Eosinophils Absolute: 0.2 10*3/uL (ref 0.0–0.5)
Eosinophils Relative: 3 %
HCT: 28.4 % — ABNORMAL LOW (ref 39.0–52.0)
Hemoglobin: 9.9 g/dL — ABNORMAL LOW (ref 13.0–17.0)
Immature Granulocytes: 0 %
Lymphocytes Relative: 12 %
Lymphs Abs: 1 10*3/uL (ref 0.7–4.0)
MCH: 31.2 pg (ref 26.0–34.0)
MCHC: 34.9 g/dL (ref 30.0–36.0)
MCV: 89.6 fL (ref 80.0–100.0)
Monocytes Absolute: 0.9 10*3/uL (ref 0.1–1.0)
Monocytes Relative: 11 %
Neutro Abs: 6 10*3/uL (ref 1.7–7.7)
Neutrophils Relative %: 73 %
Platelets: 225 10*3/uL (ref 150–400)
RBC: 3.17 MIL/uL — ABNORMAL LOW (ref 4.22–5.81)
RDW: 11.6 % (ref 11.5–15.5)
WBC: 8.1 10*3/uL (ref 4.0–10.5)
nRBC: 0 % (ref 0.0–0.2)

## 2020-06-18 LAB — COMPREHENSIVE METABOLIC PANEL
ALT: 16 U/L (ref 0–44)
AST: 35 U/L (ref 15–41)
Albumin: 3.1 g/dL — ABNORMAL LOW (ref 3.5–5.0)
Alkaline Phosphatase: 55 U/L (ref 38–126)
Anion gap: 15 (ref 5–15)
BUN: 60 mg/dL — ABNORMAL HIGH (ref 6–20)
CO2: 22 mmol/L (ref 22–32)
Calcium: 9.1 mg/dL (ref 8.9–10.3)
Chloride: 99 mmol/L (ref 98–111)
Creatinine, Ser: 10.39 mg/dL — ABNORMAL HIGH (ref 0.61–1.24)
GFR, Estimated: 5 mL/min — ABNORMAL LOW (ref >60)
Glucose, Bld: 63 mg/dL — ABNORMAL LOW (ref 70–99)
Potassium: 4.6 mmol/L (ref 3.5–5.1)
Sodium: 136 mmol/L (ref 135–145)
Total Bilirubin: 0.6 mg/dL (ref 0.3–1.2)
Total Protein: 6.6 g/dL (ref 6.5–8.1)

## 2020-06-18 MED ORDER — PHENOL 1.4 % MT LIQD
1.0000 | OROMUCOSAL | Status: DC | PRN
  Administered 2020-06-18: 1 via OROMUCOSAL
  Filled 2020-06-18: qty 177

## 2020-06-18 MED ORDER — IRBESARTAN 150 MG PO TABS
150.0000 mg | ORAL_TABLET | Freq: Every day | ORAL | Status: DC
  Administered 2020-06-18: 150 mg via ORAL
  Filled 2020-06-18: qty 1

## 2020-06-18 MED ORDER — CHLORHEXIDINE GLUCONATE CLOTH 2 % EX PADS
6.0000 | MEDICATED_PAD | Freq: Every day | CUTANEOUS | Status: DC
  Administered 2020-06-18 – 2020-06-19 (×2): 6 via TOPICAL

## 2020-06-18 MED ORDER — CARVEDILOL 25 MG PO TABS
25.0000 mg | ORAL_TABLET | Freq: Two times a day (BID) | ORAL | Status: DC
  Administered 2020-06-18 – 2020-06-19 (×2): 25 mg via ORAL
  Filled 2020-06-18 (×2): qty 1

## 2020-06-18 MED ORDER — FERRIC CITRATE 1 GM 210 MG(FE) PO TABS
420.0000 mg | ORAL_TABLET | Freq: Three times a day (TID) | ORAL | Status: DC
  Administered 2020-06-18 – 2020-06-19 (×3): 420 mg via ORAL
  Filled 2020-06-18 (×3): qty 2

## 2020-06-18 MED ORDER — CALCITRIOL 0.25 MCG PO CAPS: 2.0000 ug | ORAL_CAPSULE | ORAL | Status: DC

## 2020-06-18 NOTE — Progress Notes (Signed)
PROGRESS NOTE    Scott Castillo  ZOX:[id number] DOB: [birth year] DOA: 06/16/2020 PCP: Algis Greenhouse, MD   Brief Narrative:  Scott Castillo is a 60 y.o. male with medical history significant of ESRD on HD(MWF), DM type II, anemia of chronic disease presented with acute altered mental status during his hemodialysis session.  His wife helps provide additional history.  Apparently patient has been on dialysis for 3 years now, but over the last month he has been dealing with issues with a pinched nerve in his neck.  Over the last month patient has had several episodes during dialysis.  Apparently he gets pale, nauseous, complains of back/neck pain, numbness in his arms then legs, and subsequently developed slurred speech.  On the day of admission, he was able to drive himself to hemodialysis today at 10 AM.  However around 10:30 AM he  noted to be significantly confused and was unable to talk.    According to wife, he was not even able to recognize her. Symptoms had previously resolved after few hours, but for some reason symptoms persisted longer today.  In the past he had also been able to tell her what was going on normally he is supposed to dialyze 4 hours, but here recently since the symptoms started he has not been able to dialyze more than 2 hours.  ED Course: Upon admission into the emergency department patient was seen to have heart rates 86 107, blood pressure elevated up to 233/104, and all other vital signs maintained. He was evaluated by neurology. CT/CTA of the head neck did not show any large vessel occlusion, but did note a small right insular hypodensity of indeterminate age.  Patient was not a TPA candidate as his last known normal was not clear.Patient was noted to show some right sided weakness. Labs significant for hemoglobin 10.4, sodium 134, potassium 4.4, BUN 73, creatinine 10.85, glucose 126, and ammonia level 36.  He was noted to be yelling and screaming during his time in the  emergency department.  He had been given Ativan multiple times, Haldol, labetalol IV, and fentanyl 50 mcg IV.  Patient was subsequently admitted to hospitalist service for acute encephalopathy.  Neurology was consulted.  Initial impression was hypertensive encephalopathy versus dialysis disequilibrium syndrome since patient had presented with blood pressure of 233/104.  EEG was obtained which did not show any epileptiform activity.  Patient's prior to admission antihypertensives were resumed.  Over the course of next 24 hours, patient improved a little bit more.  MRI was obtained which ruled out any acute stroke or any other structural pathology.  Nephrology was consulted and he received his hemodialysis.  Over the following 24 hours, patient recovered significantly to the point that he was fully alert and oriented and back to himself.  Neurology had opined that patient's presentation was more consistent with dialysis disequilibrium syndrome.  They had cleared the patient for discharge and no further work-up was recommended.  Assessment & Plan:   Principal Problem:   Acute metabolic encephalopathy Active Problems:   Diabetes mellitus type 2 in obese (HCC)   Hypertensive urgency   Anemia of chronic disease   ESRD (end stage renal disease) (HCC)   Aphasia  Expressive aphasia/acute metabolic encephalopathy: Patient presents with these repeated episodes of being altered with expressive aphasia. CT/CTA of the head and neck did not note any large vessel occlusion, but did note a small right insular hypodensity concerning for indeterminate lacunar infarct versus prominent sulcus.  Neurology  was consulted and their initial impression was PRES vs. disequilibrium syndrome vs. stroke vs. Seizure.  EEG negative for any epileptiform activity, MRI brain negative for any stroke or acute pathology.  Surprisingly, patient is back to normal, alert and oriented.  Neurology has cleared the patient for  discharge.  Hypertensive emergency: Acute.  Blood pressures elevated as high as 233/104. Blood pressure much better now.  Continue current antihypertensives.  Loose stool: C. difficile ruled out.  No more loose stools.  ESRD on HD: Patient normally dialyzes Monday, Wednesday, Friday.  Nephrology on board.  Discussed with Dr. Moshe Cipro of nephrology about potentially discharging patient today however she has requested to keep patient in the hospital and she would like to try another session of dialysis tomorrow and then discharged home if that goes well.  Anemia of chronic disease due to ESRD: Hemoglobin is stable.  Monitor daily.  Diabetes Mellitus type 2: Volatile with some hypoglycemia but improving now. -Hypoglycemic protocols -CBGs qAC and HS with Sensitive   DVT prophylaxis: heparin injection 5,000 Units Start: 06/16/20 1800   Code Status: Full Code  Family Communication:  None present at bedside.   Status is: Observation  The patient will require care spanning > 2 midnights and should be moved to inpatient because: Ongoing diagnostic testing needed not appropriate for outpatient work up  Dispo: The patient is from: Home              Anticipated d/c is to: Home              Patient currently is not medically stable to d/c.   Difficult to place patient No        Estimated body mass index is 33.25 kg/m as calculated from the following:   Height as of this encounter: 6' (1.829 m).   Weight as of this encounter: 111.2 kg.      Nutritional status:               Consultants:   Neurology  Procedures:   None  Antimicrobials:  Anti-infectives (From admission, onward)   None         Subjective: Seen and examined.  Patient fully alert and oriented.  No complaints.  Wants to go home.  Objective: Vitals:   06/17/20 1411 06/17/20 2100 06/18/20 0402 06/18/20 0801  BP: (!) 151/87 (!) 147/80 (!) 169/85 (!) 162/95  Pulse:   93 86  Resp:  18 16 14    Temp:  98 F (36.7 C) 97.7 F (36.5 C) 98.7 F (37.1 C)  TempSrc:  Axillary Oral Oral  SpO2:  98% 97% 94%  Weight:      Height:        Intake/Output Summary (Last 24 hours) at 06/18/2020 1356 Last data filed at 06/18/2020 0800 Gross per 24 hour  Intake 600 ml  Output 2500 ml  Net -1900 ml   Filed Weights   06/17/20 1103 06/17/20 1409  Weight: 113.9 kg 111.2 kg    Examination:  General exam: Appears calm and comfortable  Respiratory system: Clear to auscultation. Respiratory effort normal. Cardiovascular system: S1 & S2 heard, RRR. No JVD, murmurs, rubs, gallops or clicks. No pedal edema. Gastrointestinal system: Abdomen is nondistended, soft and nontender. No organomegaly or masses felt. Normal bowel sounds heard. Central nervous system: Alert and oriented. No focal neurological deficits. Extremities: Symmetric 5 x 5 power. Skin: No rashes, lesions or ulcers.  Psychiatry: Judgement and insight appear normal. Mood & affect appropriate.   Data  Reviewed: I have personally reviewed following labs and imaging studies  CBC: Recent Labs  Lab 06/16/20 1359 06/16/20 1420 06/17/20 0246 06/18/20 0029  WBC 6.1  --  9.6 8.1  NEUTROABS 5.0  --   --  6.0  HGB 10.4* 10.5* 9.6* 9.9*  HCT 30.6* 31.0* 27.5* 28.4*  MCV 89.5  --  88.7 89.6  PLT 221  --  231 237   Basic Metabolic Panel: Recent Labs  Lab 06/16/20 1359 06/16/20 1420 06/17/20 0246 06/18/20 0029  NA 134* 134* 136 136  K 4.4 4.5 4.3 4.6  CL 96* 100 98 99  CO2 24  --  22 22  GLUCOSE 126* 121* 75 63*  BUN 73* 73* 80* 60*  CREATININE 10.85* 11.10* 12.41* 10.39*  CALCIUM 8.9  --  9.1 9.1  PHOS  --   --  9.2*  --    GFR: Estimated Creatinine Clearance: 10 mL/min (A) (by C-G formula based on SCr of 10.39 mg/dL (H)). Liver Function Tests: Recent Labs  Lab 06/16/20 1359 06/17/20 0246 06/18/20 0029  AST 11*  --  35  ALT 16  --  16  ALKPHOS 63  --  55  BILITOT 0.4  --  0.6  PROT 7.2  --  6.6  ALBUMIN 3.6 3.3*  3.1*   No results for input(s): LIPASE, AMYLASE in the last 168 hours. Recent Labs  Lab 06/16/20 1400  AMMONIA 36*   Coagulation Profile: Recent Labs  Lab 06/16/20 1359  INR 1.2   Cardiac Enzymes: No results for input(s): CKTOTAL, CKMB, CKMBINDEX, TROPONINI in the last 168 hours. BNP (last 3 results) No results for input(s): PROBNP in the last 8760 hours. HbA1C: Recent Labs    06/17/20 0246  HGBA1C 8.0*   CBG: Recent Labs  Lab 06/17/20 2056 06/18/20 0803 06/18/20 0855 06/18/20 0919 06/18/20 1234  GLUCAP 113* 65* 63* 119* 154*   Lipid Profile: No results for input(s): CHOL, HDL, LDLCALC, TRIG, CHOLHDL, LDLDIRECT in the last 72 hours. Thyroid Function Tests: Recent Labs    06/17/20 0246  TSH 0.789   Anemia Panel: No results for input(s): VITAMINB12, FOLATE, FERRITIN, TIBC, IRON, RETICCTPCT in the last 72 hours. Sepsis Labs: No results for input(s): PROCALCITON, LATICACIDVEN in the last 168 hours.  Recent Results (from the past 240 hour(s))  Resp Panel by RT-PCR (Flu A&B, Covid) Nasopharyngeal Swab     Status: None   Collection Time: 06/16/20 12:44 PM   Specimen: Nasopharyngeal Swab; Nasopharyngeal(NP) swabs in vial transport medium  Result Value Ref Range Status   SARS Coronavirus 2 by RT PCR NEGATIVE NEGATIVE Final    Comment: (NOTE) SARS-CoV-2 target nucleic acids are NOT DETECTED.  The SARS-CoV-2 RNA is generally detectable in upper respiratory specimens during the acute phase of infection. The lowest concentration of SARS-CoV-2 viral copies this assay can detect is 138 copies/mL. A negative result does not preclude SARS-Cov-2 infection and should not be used as the sole basis for treatment or other patient management decisions. A negative result may occur with  improper specimen collection/handling, submission of specimen other than nasopharyngeal swab, presence of viral mutation(s) within the areas targeted by this assay, and inadequate number of  viral copies(<138 copies/mL). A negative result must be combined with clinical observations, patient history, and epidemiological information. The expected result is Negative.  Fact Sheet for Patients:  EntrepreneurPulse.com.au  Fact Sheet for Healthcare Providers:  IncredibleEmployment.be  This test is no t yet approved or cleared by the [place] FDA  and  has been authorized for detection and/or diagnosis of SARS-CoV-2 by FDA under an Emergency Use Authorization (EUA). This EUA will remain  in effect (meaning this test can be used) for the duration of the COVID-19 declaration under Section 564(b)(1) of the Act, 21 U.S.C.section 360bbb-3(b)(1), unless the authorization is terminated  or revoked sooner.       Influenza A by PCR NEGATIVE NEGATIVE Final   Influenza B by PCR NEGATIVE NEGATIVE Final    Comment: (NOTE) The Xpert Xpress SARS-CoV-2/FLU/RSV plus assay is intended as an aid in the diagnosis of influenza from Nasopharyngeal swab specimens and should not be used as a sole basis for treatment. Nasal washings and aspirates are unacceptable for Xpert Xpress SARS-CoV-2/FLU/RSV testing.  Fact Sheet for Patients: EntrepreneurPulse.com.au  Fact Sheet for Healthcare Providers: IncredibleEmployment.be  This test is not yet approved or cleared by the [place] FDA and has been authorized for detection and/or diagnosis of SARS-CoV-2 by FDA under an Emergency Use Authorization (EUA). This EUA will remain in effect (meaning this test can be used) for the duration of the COVID-19 declaration under Section 564(b)(1) of the Act, 21 U.S.C. section 360bbb-3(b)(1), unless the authorization is terminated or revoked.  Performed at [facility], [place] [address], [place], [place] [address]   C Difficile Quick Screen w PCR reflex     Status: None   Collection Time: 06/17/20  6:50 PM   Specimen: STOOL   Result Value Ref Range Status   C Diff antigen NEGATIVE NEGATIVE Final   C Diff toxin NEGATIVE NEGATIVE Final   C Diff interpretation No C. difficile detected.  Final    Comment: Performed at [facility], [place] [address], [place], [place] [address]      Radiology Studies: MR BRAIN WO CONTRAST  Result Date: 06/17/2020 CLINICAL DATA:  Confusion, code stroke follow-up EXAM: MRI HEAD WITHOUT CONTRAST TECHNIQUE: Multiplanar, multiecho pulse sequences of the brain and surrounding structures were obtained without intravenous contrast. COMPARISON:  05/03/2020 FINDINGS: Motion artifact is present. Brain: There is no acute infarction or intracranial hemorrhage. There is no intracranial mass, mass effect, or edema. There is no hydrocephalus or extra-axial fluid collection. Prominence of the ventricles and sulci reflects stable parenchymal volume loss greater than expected for age. Vascular: [place] vessel flow voids at the skull base are preserved. Skull and upper cervical spine: Normal marrow signal is preserved. Sinuses/Orbits: Mild mucosal thickening.  Left lens replacement. Other: Sella is unremarkable.  Mastoid air cells are clear. IMPRESSION: Motion artifact. No acute infarction, hemorrhage, or mass. Stable parenchymal volume loss greater than expected for age. Electronically Signed   By: Macy Mis M.D.   On: 06/17/2020 16:40   EEG adult  Result Date: 06/16/2020 Lora Havens, MD     06/16/2020  7:13 PM Patient Name: Damarko Stitely Guilmette MRN: [id number] Epilepsy Attending: Lora Havens Referring Physician/Provider: Dr Meredith Staggers Dagar Date: 06/16/2020 Duration: 22.42 mins Patient history: 60 y.o. male with PMHx of HLD on CKD, ESRD, diabetes mellitus type 2, hyperlipidemia, anemia of chronic disease, venous stasis dermatitis of both lower extremities presented with expressive aphasia and altered mental status post hemodialysis. EEG to evaluate for seizure Level of alertness: awake AEDs during EEG  study: None Technical aspects: This EEG study was done with scalp electrodes positioned according to the 10-20 International system of electrode placement. Electrical activity was acquired at a sampling rate of 500Hz  and reviewed with a high frequency filter of 70Hz  and a low  frequency filter of 1Hz . EEG data were recorded continuously and digitally stored. Description: No posterior dominant rhythm was seen.  EEG showed continuous generalized 3 to 6 Hz theta-delta slowing. Hyperventilation and photic stimulation were not performed.   ABNORMALITY -Continuous slow, generalized IMPRESSION: This study is suggestive of moderate diffuse encephalopathy, nonspecific etiology. No seizures or epileptiform discharges were seen throughout the recording. Lora Havens   ECHOCARDIOGRAM COMPLETE  Result Date: 06/17/2020    ECHOCARDIOGRAM REPORT   Patient Name:   NTHONY LEFFERTS Woollard Date of Exam: 06/17/2020 Medical Rec #:  [id number]       Height:       72.0 in Accession #:    [id number]      Weight:       255.5 lb Date of Birth:  July 29, 1960       BSA:          2.364 m Patient Age:    3 years        BP:           142/94 mmHg Patient Gender: M               HR:           106 bpm. Exam Location:  Inpatient Procedure: 2D Echo, Cardiac Doppler and Color Doppler Indications:    Stroke I63.9  History:        Patient has prior history of Echocardiogram examinations, most                 recent 07/05/2016.  Sonographer:    Bernadene Person RDCS Referring Phys: [id number] RONDELL A SMITH  Sonographer Comments: Image acquisition challenging due to uncooperative patient. IMPRESSIONS  1. Prominent trabeculations in LV apex cannot r/o ventricular non compaction consider f/u cardiac MRI if clinicially indicated There is no clear indication for anticoagulation for non compaction in absence of LV dsyfunction or PaF . Left ventricular ejection fraction, by estimation, is 55 to 60%. The left ventricle has normal function. The left ventricle has no  regional wall motion abnormalities. Left ventricular diastolic parameters were normal.  2. Right ventricular systolic function is normal. The right ventricular size is normal.  3. Left atrial size was moderately dilated.  4. The mitral valve is abnormal. Mild mitral valve regurgitation. No evidence of mitral stenosis.  5. The aortic valve is tricuspid. There is moderate calcification of the aortic valve. There is moderate thickening of the aortic valve. Aortic valve regurgitation is trivial. Mild to moderate aortic valve sclerosis/calcification is present, without any  evidence of aortic stenosis.  6. The inferior vena cava is normal in size with greater than 50% respiratory variability, suggesting right atrial pressure of 3 mmHg. FINDINGS  Left Ventricle: Prominent trabeculations in LV apex cannot r/o ventricular non compaction consider f/u cardiac MRI if clinicially indicated There is no clear indication for anticoagulation for non compaction in absence of LV dsyfunction or PaF. Left ventricular ejection fraction, by estimation, is 55 to 60%. The left ventricle has normal function. The left ventricle has no regional wall motion abnormalities. The left ventricular internal cavity size was normal in size. There is no left ventricular hypertrophy. Left ventricular diastolic parameters were normal. Right Ventricle: The right ventricular size is normal. No increase in right ventricular wall thickness. Right ventricular systolic function is normal. Left Atrium: Left atrial size was moderately dilated. Right Atrium: Right atrial size was normal in size. Pericardium: There is no evidence of pericardial effusion. Mitral Valve: The mitral valve  is abnormal. There is moderate thickening of the mitral valve leaflet(s). There is mild calcification of the mitral valve leaflet(s). Mild mitral valve regurgitation. No evidence of mitral valve stenosis. Tricuspid Valve: The tricuspid valve is normal in structure. Tricuspid valve  regurgitation is not demonstrated. No evidence of tricuspid stenosis. Aortic Valve: The aortic valve is tricuspid. There is moderate calcification of the aortic valve. There is moderate thickening of the aortic valve. Aortic valve regurgitation is trivial. Mild to moderate aortic valve sclerosis/calcification is present, without any evidence of aortic stenosis. Pulmonic Valve: The pulmonic valve was normal in structure. Pulmonic valve regurgitation is not visualized. No evidence of pulmonic stenosis. Aorta: The aortic root is normal in size and structure. Venous: The inferior vena cava is normal in size with greater than 50% respiratory variability, suggesting right atrial pressure of 3 mmHg. IAS/Shunts: No atrial level shunt detected by color flow Doppler.  LEFT VENTRICLE PLAX 2D LVIDd:         5.40 cm  Diastology LVIDs:         3.80 cm  LV e' medial:    7.72 cm/s LV PW:         0.90 cm  LV E/e' medial:  12.7 LV IVS:        1.00 cm  LV e' lateral:   9.25 cm/s LVOT diam:     1.90 cm  LV E/e' lateral: 10.6 LV SV:         61 LV SV Index:   26 LVOT Area:     2.84 cm  RIGHT VENTRICLE RV S prime:     10.40 cm/s TAPSE (M-mode): 2.5 cm LEFT ATRIUM             Index       RIGHT ATRIUM           Index LA diam:        4.70 cm 1.99 cm/m  RA Area:     14.00 cm LA Vol (A2C):   71.5 ml 30.24 ml/m RA Volume:   25.40 ml  10.74 ml/m LA Vol (A4C):   67.2 ml 28.42 ml/m LA Biplane Vol: 70.2 ml 29.69 ml/m  AORTIC VALVE LVOT Vmax:   95.30 cm/s LVOT Vmean:  72.700 cm/s LVOT VTI:    0.215 m  AORTA Ao Root diam: 3.70 cm Ao Asc diam:  3.60 cm MITRAL VALVE MV Area (PHT): 2.97 cm    SHUNTS MV Decel Time: 255 msec    Systemic VTI:  0.22 m MV E velocity: 97.90 cm/s  Systemic Diam: 1.90 cm MV A velocity: 99.00 cm/s MV E/A ratio:  0.99 Jenkins Rouge MD Electronically signed by Jenkins Rouge MD Signature Date/Time: 06/17/2020/10:50:32 AM    Final     Scheduled Meds: . aspirin EC  81 mg Oral Daily  . [START ON 06/19/2020] calcitRIOL  2 mcg  Oral Q T,Th,Sa-HD  . carvedilol  25 mg Oral BID WC  . Chlorhexidine Gluconate Cloth  6 each Topical Q0600  . Chlorhexidine Gluconate Cloth  6 each Topical Q0600  . darbepoetin (ARANESP) injection - NON-DIALYSIS  60 mcg Subcutaneous Q Tue-1800  . ferric citrate  420 mg Oral TID WC  . heparin  5,000 Units Subcutaneous Q8H  . insulin aspart  0-5 Units Subcutaneous QHS  . insulin aspart  0-9 Units Subcutaneous TID WC  . irbesartan  150 mg Oral QHS  . sodium chloride flush  3 mL Intravenous Q12H   Continuous Infusions:  LOS: 1 day   Time spent: 30 minutes  Darliss Cheney, MD [facility]  06/18/2020, 1:56 PM   To contact the attending provider between 7A-7P or the covering provider during after hours 7P-7A, please log into the web site [email or web address].

## 2020-06-18 NOTE — Progress Notes (Signed)
Subjective:   Did a low BFR HD yest and tolerated much better-  Is really back to normal this AM-  BP also better   Objective Vital signs in last 24 hours: Vitals:   06/17/20 1411 06/17/20 2100 06/18/20 0402 06/18/20 0801  BP: (!) 151/87 (!) 147/80 (!) 169/85 (!) 162/95  Pulse:   93 86  Resp:  18 16 14   Temp:  98 F (36.7 C) 97.7 F (36.5 C) 98.7 F (37.1 C)  TempSrc:  Axillary Oral Oral  SpO2:  98% 97% 94%  Weight:      Height:       Weight change:   Intake/Output Summary (Last 24 hours) at 06/18/2020 1610 Last data filed at 06/17/2020 1700 Gross per 24 hour  Intake 360 ml  Output 2500 ml  Net -2140 ml   Dialysis Orders:  MWF  - [facility]  4:30hrs, BFR 450, DFR 500,  EDW 111.5kg, 2K/ 2.25Ca-  Been signing off the majority of treatments of late  Access: L AVF Calcitriol 2 mcg PO qHD-last 2020-07-16 Scott Castillo 210mg  2 tablets TID with meals  Last Labs: Hgb 10.5, TSAT 26 (05/28/20 OP HD), K 4.5, Ca 8.9, P 8.6 (05/28/20 OP HD), PTH 587 (05/28/20 OP HD)   Assessment/ Plan: Pt is a 60 y.o. yo male with ESRD, DM, HTN who was admitted on 07-16-20 with confusion after HD  Assessment/Plan: 1. MS change after HD-  Difficult to tease out etiology, has been having numbness and difficulty with speech during HD that lasts for a few hours but MS change was much more pronounced with HD on 2022/07/16-  BP does not drop with HD-  Actually the opposite.  I was concerned about hypertensive encephalopathy vs dialysis dysequilibruim.  BP controlled with IV agents overnight and MS improved.  Then sedating meds were clouding the picture.  Much better this AM-  Again ,  Thinking it is a combo of the above 2. ESRD -had not had effective HD due to sign offs, may have led to higher than normal BUN and resultant dialysis dysequilibrium-  taking this opportunity to do very gentle HD to see if he will tolerate it better - try to get the BUN down.  Did tolerate yest off schedule-  Will plan for again tomorrow  off schedule- a little higher BFR  3. Anemia- added  ESA 4. Secondary hyperparathyroidism-  Continue home calcitriol and auryxia now that able to take POs 5. HTN/volume-  BP has been very high-  For now using PRN IV agents until alert enough to take POs.  Will resume his home coreg and avapro  [name]    Labs: Basic Metabolic Panel: Recent Labs  Lab 07/16/20 1359 16-Jul-2020 1420 06/17/20 0246 06/18/20 0029  NA 134* 134* 136 136  K 4.4 4.5 4.3 4.6  CL 96* 100 98 99  CO2 24  --  22 22  GLUCOSE 126* 121* 75 63*  BUN 73* 73* 80* 60*  CREATININE 10.85* 11.10* 12.41* 10.39*  CALCIUM 8.9  --  9.1 9.1  PHOS  --   --  9.2*  --    Liver Function Tests: Recent Labs  Lab 07-16-20 1359 06/17/20 0246 06/18/20 0029  AST 11*  --  35  ALT 16  --  16  ALKPHOS 63  --  55  BILITOT 0.4  --  0.6  PROT 7.2  --  6.6  ALBUMIN 3.6 3.3* 3.1*   No results for input(s): LIPASE,  AMYLASE in the last 168 hours. Recent Labs  Lab 06/16/20 1400  AMMONIA 36*   CBC: Recent Labs  Lab 06/16/20 1359 06/16/20 1420 06/17/20 0246 06/18/20 0029  WBC 6.1  --  9.6 8.1  NEUTROABS 5.0  --   --  6.0  HGB 10.4* 10.5* 9.6* 9.9*  HCT 30.6* 31.0* 27.5* 28.4*  MCV 89.5  --  88.7 89.6  PLT 221  --  231 225   Cardiac Enzymes: No results for input(s): CKTOTAL, CKMB, CKMBINDEX, TROPONINI in the last 168 hours. CBG: Recent Labs  Lab 06/17/20 1639 06/17/20 1704 06/17/20 1812 06/17/20 2056 06/18/20 0803  GLUCAP 62* 64* 147* 113* 65*    Iron Studies: No results for input(s): IRON, TIBC, TRANSFERRIN, FERRITIN in the last 72 hours. Studies/Results: CT Code Stroke CTA Head W/WO contrast  Result Date: 06/16/2020 CLINICAL DATA:  Focal neuro deficit, greater than 6 hours, stroke suspected. Aphasia, altered mental status EXAM: CT ANGIOGRAPHY HEAD AND NECK TECHNIQUE: Multidetector CT imaging of the head and neck was performed using the standard protocol during bolus administration of intravenous  contrast. Multiplanar CT image reconstructions and MIPs were obtained to evaluate the vascular anatomy. Carotid stenosis measurements (when applicable) are obtained utilizing NASCET criteria, using the distal internal carotid diameter as the denominator. CONTRAST:  12mL OMNIPAQUE IOHEXOL 350 MG/ML SOLN COMPARISON:  Noncontrast head CT performed earlier today 06/16/2020. MRI/MRA head 05/03/2020. MRA neck 05/03/2020. FINDINGS: CTA NECK FINDINGS Aortic arch: Standard aortic branching. The visualized aortic arch is unremarkable. Minimal atherosclerotic plaque within the proximal left subclavian artery. No hemodynamically significant innominate or proximal subclavian artery stenosis. Right carotid system: CCA and ICA patent within the neck without stenosis. Minimal atherosclerotic plaque within the carotid bifurcation. Tortuosity of the cervical ICA. Left carotid system: CCA and ICA patent within the neck without stenosis. Mild predominantly soft plaque within the mid to distal ICA. Vertebral arteries: Codominant and patent within the neck without stenosis. Skeleton: No acute bony abnormality or aggressive osseous lesion. Cervical spondylosis with multilevel disc space narrowing, disc bulges, posterior disc osteophytes, uncovertebral hypertrophy. Disc space narrowing is greatest at C5-C6 and C6-C7 (advanced at these levels). Other neck: No neck mass or cervical lymphadenopathy. Upper chest: No consolidation within the imaged lung apices. Review of the MIP images confirms the above findings CTA HEAD FINDINGS Anterior circulation: The intracranial internal carotid arteries are patent. Nonstenotic calcified plaque within these vessels bilaterally. The M1 middle cerebral arteries are patent. No M2 proximal branch occlusion or high-grade proximal stenosis is identified. The anterior cerebral arteries are patent. No intracranial aneurysm is identified. Posterior circulation: The intracranial vertebral arteries are patent. Mild  nonstenotic calcified plaque within both V4 segments. The basilar artery is patent. The posterior cerebral arteries are patent. A right posterior communicating artery is present. The left posterior communicating artery is hypoplastic or absent. Venous sinuses: Within the limitations of contrast timing, no convincing thrombus. Anatomic variants: As described Review of the MIP images confirms the above findings No large vessel occlusion. These results were communicated to Dr. Leonel Ramsay at 1:50 pmon 2/28/2022by text page via the [facility] messaging system. IMPRESSION: CTA neck: Common carotid, internal carotid and vertebral arteries patent within the neck without hemodynamically significant stenosis. Mild atherosclerotic disease within the carotid systems as described. CTA head: 1. No intracranial large vessel occlusion or proximal high-grade arterial stenosis. 2. Nonstenotic calcified plaque within the intracranial internal carotid arteries and V4 vertebral arteries bilaterally. Electronically Signed   By: Kellie Simmering DO   On:  06/16/2020 13:52   CT Code Stroke CTA Neck W/WO contrast  Result Date: 06/16/2020 CLINICAL DATA:  Focal neuro deficit, greater than 6 hours, stroke suspected. Aphasia, altered mental status EXAM: CT ANGIOGRAPHY HEAD AND NECK TECHNIQUE: Multidetector CT imaging of the head and neck was performed using the standard protocol during bolus administration of intravenous contrast. Multiplanar CT image reconstructions and MIPs were obtained to evaluate the vascular anatomy. Carotid stenosis measurements (when applicable) are obtained utilizing NASCET criteria, using the distal internal carotid diameter as the denominator. CONTRAST:  105mL OMNIPAQUE IOHEXOL 350 MG/ML SOLN COMPARISON:  Noncontrast head CT performed earlier today 06/16/2020. MRI/MRA head 05/03/2020. MRA neck 05/03/2020. FINDINGS: CTA NECK FINDINGS Aortic arch: Standard aortic branching. The visualized aortic arch is unremarkable. Minimal  atherosclerotic plaque within the proximal left subclavian artery. No hemodynamically significant innominate or proximal subclavian artery stenosis. Right carotid system: CCA and ICA patent within the neck without stenosis. Minimal atherosclerotic plaque within the carotid bifurcation. Tortuosity of the cervical ICA. Left carotid system: CCA and ICA patent within the neck without stenosis. Mild predominantly soft plaque within the mid to distal ICA. Vertebral arteries: Codominant and patent within the neck without stenosis. Skeleton: No acute bony abnormality or aggressive osseous lesion. Cervical spondylosis with multilevel disc space narrowing, disc bulges, posterior disc osteophytes, uncovertebral hypertrophy. Disc space narrowing is greatest at C5-C6 and C6-C7 (advanced at these levels). Other neck: No neck mass or cervical lymphadenopathy. Upper chest: No consolidation within the imaged lung apices. Review of the MIP images confirms the above findings CTA HEAD FINDINGS Anterior circulation: The intracranial internal carotid arteries are patent. Nonstenotic calcified plaque within these vessels bilaterally. The M1 middle cerebral arteries are patent. No M2 proximal branch occlusion or high-grade proximal stenosis is identified. The anterior cerebral arteries are patent. No intracranial aneurysm is identified. Posterior circulation: The intracranial vertebral arteries are patent. Mild nonstenotic calcified plaque within both V4 segments. The basilar artery is patent. The posterior cerebral arteries are patent. A right posterior communicating artery is present. The left posterior communicating artery is hypoplastic or absent. Venous sinuses: Within the limitations of contrast timing, no convincing thrombus. Anatomic variants: As described Review of the MIP images confirms the above findings No large vessel occlusion. These results were communicated to Dr. Leonel Ramsay at 1:50 pmon 2/28/2022by text page via the  [facility] messaging system. IMPRESSION: CTA neck: Common carotid, internal carotid and vertebral arteries patent within the neck without hemodynamically significant stenosis. Mild atherosclerotic disease within the carotid systems as described. CTA head: 1. No intracranial large vessel occlusion or proximal high-grade arterial stenosis. 2. Nonstenotic calcified plaque within the intracranial internal carotid arteries and V4 vertebral arteries bilaterally. Electronically Signed   By: Kellie Simmering DO   On: 06/16/2020 13:52   MR BRAIN WO CONTRAST  Result Date: 06/17/2020 CLINICAL DATA:  Confusion, code stroke follow-up EXAM: MRI HEAD WITHOUT CONTRAST TECHNIQUE: Multiplanar, multiecho pulse sequences of the brain and surrounding structures were obtained without intravenous contrast. COMPARISON:  05/03/2020 FINDINGS: Motion artifact is present. Brain: There is no acute infarction or intracranial hemorrhage. There is no intracranial mass, mass effect, or edema. There is no hydrocephalus or extra-axial fluid collection. Prominence of the ventricles and sulci reflects stable parenchymal volume loss greater than expected for age. Vascular: Pedretti vessel flow voids at the skull base are preserved. Skull and upper cervical spine: Normal marrow signal is preserved. Sinuses/Orbits: Mild mucosal thickening.  Left lens replacement. Other: Sella is unremarkable.  Mastoid air cells are clear. IMPRESSION:  Motion artifact. No acute infarction, hemorrhage, or mass. Stable parenchymal volume loss greater than expected for age. Electronically Signed   By: Macy Mis M.D.   On: 06/17/2020 16:40   EEG adult  Result Date: 06/16/2020 Lora Havens, MD     06/16/2020  7:13 PM Patient Name: Scott Castillo MRN: [id number] Epilepsy Attending: Lora Havens Referring Physician/Provider: Dr Meredith Staggers Dagar Date: 06/16/2020 Duration: 22.42 mins Patient history: 60 y.o. male with PMHx of HLD on CKD, ESRD, diabetes mellitus type 2,  hyperlipidemia, anemia of chronic disease, venous stasis dermatitis of both lower extremities presented with expressive aphasia and altered mental status post hemodialysis. EEG to evaluate for seizure Level of alertness: awake AEDs during EEG study: None Technical aspects: This EEG study was done with scalp electrodes positioned according to the 10-20 International system of electrode placement. Electrical activity was acquired at a sampling rate of 500Hz  and reviewed with a high frequency filter of 70Hz  and a low frequency filter of 1Hz . EEG data were recorded continuously and digitally stored. Description: No posterior dominant rhythm was seen.  EEG showed continuous generalized 3 to 6 Hz theta-delta slowing. Hyperventilation and photic stimulation were not performed.   ABNORMALITY -Continuous slow, generalized IMPRESSION: This study is suggestive of moderate diffuse encephalopathy, nonspecific etiology. No seizures or epileptiform discharges were seen throughout the recording. Lora Havens   ECHOCARDIOGRAM COMPLETE  Result Date: 06/17/2020    ECHOCARDIOGRAM REPORT   Patient Name:   Scott Castillo Date of Exam: 06/17/2020 Medical Rec #:  [id number]       Height:       72.0 in Accession #:    [id number]      Weight:       255.5 lb Date of Birth:  1960-12-02       BSA:          2.364 m Patient Age:    60 years        BP:           142/94 mmHg Patient Gender: M               HR:           106 bpm. Exam Location:  Inpatient Procedure: 2D Echo, Cardiac Doppler and Color Doppler Indications:    Stroke I63.9  History:        Patient has prior history of Echocardiogram examinations, most                 recent 07/05/2016.  Sonographer:    Bernadene Person RDCS Referring Phys: [id number] RONDELL A SMITH  Sonographer Comments: Image acquisition challenging due to uncooperative patient. IMPRESSIONS  1. Prominent trabeculations in LV apex cannot r/o ventricular non compaction consider f/u cardiac MRI if clinicially indicated  There is no clear indication for anticoagulation for non compaction in absence of LV dsyfunction or PaF . Left ventricular ejection fraction, by estimation, is 55 to 60%. The left ventricle has normal function. The left ventricle has no regional wall motion abnormalities. Left ventricular diastolic parameters were normal.  2. Right ventricular systolic function is normal. The right ventricular size is normal.  3. Left atrial size was moderately dilated.  4. The mitral valve is abnormal. Mild mitral valve regurgitation. No evidence of mitral stenosis.  5. The aortic valve is tricuspid. There is moderate calcification of the aortic valve. There is moderate thickening of the aortic valve. Aortic valve regurgitation is trivial. Mild to moderate aortic valve sclerosis/calcification  is present, without any  evidence of aortic stenosis.  6. The inferior vena cava is normal in size with greater than 50% respiratory variability, suggesting right atrial pressure of 3 mmHg. FINDINGS  Left Ventricle: Prominent trabeculations in LV apex cannot r/o ventricular non compaction consider f/u cardiac MRI if clinicially indicated There is no clear indication for anticoagulation for non compaction in absence of LV dsyfunction or PaF. Left ventricular ejection fraction, by estimation, is 55 to 60%. The left ventricle has normal function. The left ventricle has no regional wall motion abnormalities. The left ventricular internal cavity size was normal in size. There is no left ventricular hypertrophy. Left ventricular diastolic parameters were normal. Right Ventricle: The right ventricular size is normal. No increase in right ventricular wall thickness. Right ventricular systolic function is normal. Left Atrium: Left atrial size was moderately dilated. Right Atrium: Right atrial size was normal in size. Pericardium: There is no evidence of pericardial effusion. Mitral Valve: The mitral valve is abnormal. There is moderate thickening of  the mitral valve leaflet(s). There is mild calcification of the mitral valve leaflet(s). Mild mitral valve regurgitation. No evidence of mitral valve stenosis. Tricuspid Valve: The tricuspid valve is normal in structure. Tricuspid valve regurgitation is not demonstrated. No evidence of tricuspid stenosis. Aortic Valve: The aortic valve is tricuspid. There is moderate calcification of the aortic valve. There is moderate thickening of the aortic valve. Aortic valve regurgitation is trivial. Mild to moderate aortic valve sclerosis/calcification is present, without any evidence of aortic stenosis. Pulmonic Valve: The pulmonic valve was normal in structure. Pulmonic valve regurgitation is not visualized. No evidence of pulmonic stenosis. Aorta: The aortic root is normal in size and structure. Venous: The inferior vena cava is normal in size with greater than 50% respiratory variability, suggesting right atrial pressure of 3 mmHg. IAS/Shunts: No atrial level shunt detected by color flow Doppler.  LEFT VENTRICLE PLAX 2D LVIDd:         5.40 cm  Diastology LVIDs:         3.80 cm  LV e' medial:    7.72 cm/s LV PW:         0.90 cm  LV E/e' medial:  12.7 LV IVS:        1.00 cm  LV e' lateral:   9.25 cm/s LVOT diam:     1.90 cm  LV E/e' lateral: 10.6 LV SV:         61 LV SV Index:   26 LVOT Area:     2.84 cm  RIGHT VENTRICLE RV S prime:     10.40 cm/s TAPSE (M-mode): 2.5 cm LEFT ATRIUM             Index       RIGHT ATRIUM           Index LA diam:        4.70 cm 1.99 cm/m  RA Area:     14.00 cm LA Vol (A2C):   71.5 ml 30.24 ml/m RA Volume:   25.40 ml  10.74 ml/m LA Vol (A4C):   67.2 ml 28.42 ml/m LA Biplane Vol: 70.2 ml 29.69 ml/m  AORTIC VALVE LVOT Vmax:   95.30 cm/s LVOT Vmean:  72.700 cm/s LVOT VTI:    0.215 m  AORTA Ao Root diam: 3.70 cm Ao Asc diam:  3.60 cm MITRAL VALVE MV Area (PHT): 2.97 cm    SHUNTS MV Decel Time: 255 msec    Systemic VTI:  0.22 m  MV E velocity: 97.90 cm/s  Systemic Diam: 1.90 cm MV A velocity:  99.00 cm/s MV E/A ratio:  0.99 Jenkins Rouge MD Electronically signed by Jenkins Rouge MD Signature Date/Time: 06/17/2020/10:50:32 AM    Final    CT HEAD CODE STROKE WO CONTRAST  Result Date: 06/16/2020 CLINICAL DATA:  Code stroke.  Neuro deficit, acute, stroke suspected EXAM: CT HEAD WITHOUT CONTRAST TECHNIQUE: Contiguous axial images were obtained from the base of the skull through the vertex without intravenous contrast. COMPARISON:  05/09/2020 and prior. FINDINGS: Please note that image quality is degraded by motion artifact. Brain: No acute infarct or intracranial hemorrhage. No mass lesion. No midline shift, ventriculomegaly or extra-axial fluid collection. Mild cerebral atrophy with ex vacuo dilatation. Small right insular hypodensity. Vascular: No hyperdense vessel or unexpected calcification. Bilateral skull base atherosclerotic calcifications. Skull: Negative for fracture or focal lesion. Sinuses/Orbits: No acute finding. Other: None. ASPECTS [facility] Stroke Program Early CT Score) - Ganglionic level infarction (caudate, lentiform nuclei, internal capsule, insula, M1-M3 cortex): 6 - Supraganglionic infarction (M4-M6 cortex): 3 Total score (0-10 with 10 being normal): 9 IMPRESSION: 1. Small right insular hypodensity may reflect age indeterminate lacunar insult versus prominent sulcus. Motion degraded exam. 2. Mild cerebral atrophy. 3. ASPECTS is 9 4. Code stroke imaging results were communicated on 06/16/2020 at 1:18 pm to provider Dr. Leonel Ramsay via secure text paging. Electronically Signed   By: Primitivo Gauze M.D.   On: 06/16/2020 13:21   Medications: Infusions:   Scheduled Medications: . aspirin EC  81 mg Oral Daily  . Chlorhexidine Gluconate Cloth  6 each Topical Q0600  . darbepoetin (ARANESP) injection - NON-DIALYSIS  60 mcg Subcutaneous Q Tue-1800  . heparin  5,000 Units Subcutaneous Q8H  . insulin aspart  0-5 Units Subcutaneous QHS  . insulin aspart  0-9 Units Subcutaneous TID WC   . sodium chloride flush  3 mL Intravenous Q12H    have reviewed scheduled and prn medications.  Physical Exam: General: alert, eating breakfast  Heart: RRR Lungs: mostly clear Abdomen: obese, soft, non tender Extremities: min edema Dialysis Access: left AVF-  Patent     06/18/2020,8:43 AM  LOS: 1 day

## 2020-06-18 NOTE — Progress Notes (Signed)
Subjective: Patient feels like he is back to his normal self.  Nephrology reduce the aggressiveness of his dialysis session yesterday, and he tolerated it well without numbness or difficulty speaking.  Exam: Vitals:   06/18/20 0402 06/18/20 0801  BP: (!) 169/85 (!) 162/95  Pulse: 93 86  Resp: 16 14  Temp: 97.7 F (36.5 C) 98.7 F (37.1 C)  SpO2: 97% 94%   Gen: In bed, NAD Resp: non-labored breathing, no acute distress Abd: soft, nt  Neuro: MS: Awake, alert, oriented and appropriate OF:BPZWC, he has decreased vision in his left eye which he states is due to "blood in my left eye" that ophthalmology is following as an outpatient, visual fields are full in his right eye. Motor: He has pain with movement of his right lower extremity, he states that this is a longstanding issue.  5/5 bilateral upper extremities Sensory:intact to LT, no extinction to DSS  Pertinent Labs:   Impression: 60 yo M with recurrent episodes of speech difficulty, bilateral numbness during dialysis.  He was severely hypertensive on arrival, and certainly hypertension may be playing a role if he is consistently hypertensive during these episodes.  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  With no evidence of pres on MRI, no specific guidance on blood pressure other than avoiding wide fluctuations in severe hypertension with dialysis.  Recommendations: 1) no further neurodiagnostics at this time, please call with further questions or concerns. 2) we will defer to nephrology for further guidance on gentle dialysis as an outpatient. 3) please call if further questions or concerns.  Roland Rack, MD [facility] [phone]  If 7pm- 7am, please page neurology on call as listed in [place].

## 2020-06-19 DIAGNOSIS — I160 Hypertensive urgency: Secondary | ICD-10-CM | POA: Diagnosis not present

## 2020-06-19 LAB — GLUCOSE, CAPILLARY: Glucose-Capillary: 124 mg/dL — ABNORMAL HIGH (ref 70–99)

## 2020-06-19 MED ORDER — DARBEPOETIN ALFA 60 MCG/0.3ML IJ SOSY
60.0000 ug | PREFILLED_SYRINGE | INTRAMUSCULAR | Status: DC
  Filled 2020-06-19: qty 0.3

## 2020-06-19 MED ORDER — CALCITRIOL 0.5 MCG PO CAPS
ORAL_CAPSULE | ORAL | Status: AC
  Administered 2020-06-19: 2 ug via ORAL
  Filled 2020-06-19: qty 4

## 2020-06-19 MED ORDER — DARBEPOETIN ALFA 60 MCG/0.3ML IJ SOSY
PREFILLED_SYRINGE | INTRAMUSCULAR | Status: AC
  Administered 2020-06-19: 60 ug via INTRAVENOUS
  Filled 2020-06-19: qty 0.3

## 2020-06-19 MED ORDER — IRBESARTAN 150 MG PO TABS: 75.0000 mg | ORAL_TABLET | Freq: Every day | ORAL | Status: DC

## 2020-06-19 NOTE — Discharge Instructions (Signed)
Dialysis Dialysis is a procedure that is done when the kidneys have stopped working properly (kidney failure). It may also be done earlier if it may help improve symptoms. During dialysis, wastes, salt, and extra water are removed from the blood, and the levels of certain minerals in the blood are maintained. Dialysis is done in sessions that are continued until the kidneys get better. If the kidneys cannot get better, such as in end-stage kidney disease, dialysis is continued for life or until you receive a new kidney from a donor (kidney transplant). Types of treatments There are two types of dialysis: hemodialysis and peritoneal dialysis. Both types of dialysis have advantages and disadvantages. Talk with your health care provider about which type of dialysis is best for you. Your lifestyle, preferences, and medical condition will be considered. In some cases, only one type of dialysis can be chosen. Hemodialysis Hemodialysis is when blood is filtered with a machine and a filter called a dialyzer. This treatment is usually done at a hospital or dialysis center three times per week. Visits last about 3-5 hours.  Before you start hemodialysis treatment, you will have minor surgery to create a vascular access point. Your vascular access will be where you'll connect to the dialyzer.  Home hemodialysis may also be an option for some patients. This means that hemodialysis can be performed at home with the help of a family member or caregiver who has had special training. Peritoneal dialysis Peritoneal dialysis is when the thin lining of the abdomen (peritoneum) and a fluid called dialysate are used to filter the blood.  Before you start peritoneal dialysis, a surgeon places a soft tube, called a catheter, in your belly.  You may do peritoneal dialysis at home or at almost any other location. After training, most people can perform both types of peritoneal dialysis on their own.  You may need up to  five exchanges a day. Each exchange takes about 30-40 minutes. The amount of time the dialysate is in your body between exchanges is called a dwell. The dwell usually lasts 1.5-3 hours and can vary with each person. You may choose to do exchanges at night while you sleep, using a machine called a cycler. What are the advantages? Advantages of hemodialysis  It is done less often than peritoneal dialysis.  Someone else can do the dialysis for you.  If you go to a dialysis center: ? Your health care provider can recognize any problems you may be having. ? You can interact with others who are having dialysis. This can provide you with emotional support. Advantages of peritoneal dialysis  It is less likely than hemodialysis to cause cramps and low blood pressure.  There are fewer eating restrictions than with hemodialysis.  You may do exchanges on your own wherever you are, including when you travel.  If you do automated peritoneal dialysis, you can set up your cycler every night. What are the disadvantages Disadvantages of hemodialysis Generally, hemodialysis and peritoneal dialysis are safe treatments. However, problems may occur.  Cramps and low blood pressure. It may leave you feeling tired on the days you have the treatment.  The need to make weekly appointments and work around the center's schedule, if you go to a dialysis center.  The need to take extra care when traveling. If you usually get treatment in a dialysis center, you will need to arrange to visit a dialysis center near your destination. If you are having treatments at home, you will need to   take the dialyzer with you when traveling.  More eating restrictions than with peritoneal dialysis. Disadvantages of peritoneal dialysis  More frequent treatments than with hemodialysis.  The need to have a good use (dexterity) of your hands. You must also be able to lift bags.  The need to learn how to make your equipment free of  germs (sterilization techniques). You will need to use these techniques every day to prevent infection. What happens before the treatment? Hemodialysis Before starting hemodialysis, you will have minor surgery to create a site where blood can be removed from your body and returned to your body (vascular access). There are three types of vascular accesses:  Arteriovenous (AV) fistula. This type of access is created when an artery and a vein (usually in the arm) are connected during surgery. The AV fistula also has a large diameter that allows your blood to flow out and back into your body quickly. The goal is to allow high blood flow so that the largest amount of blood can pass through the dialyzer. The arteriovenous fistula usually takes 1-6 months to develop after surgery. It may last longer than the other types of vascular accesses and is less likely to become infected or cause blood clots.  Arteriovenous graft. If problems with your veins prevent you from having an AV fistula, you may need an AV graft instead. This type of access is created when an artery and a vein in the arm are connected during surgery with a tube. An arteriovenous graft can usually be used within 2-3 weeks of surgery.  A venous catheter. To create this type of access, a thin tube (catheter) is placed in a large vein in your neck, chest, or groin. A venous catheter can be used right away. It is usually used as a short-term access when dialysis needs to be started right away. Peritoneal dialysis Before starting peritoneal dialysis, you will have surgery to place a catheter in your abdomen. The catheter will be used to transfer a solution called dialysate to and from your abdomen. What happens during the treatment? Hemodialysis During hemodialysis, blood will leave your body through your vascular access site. Blood will travel through a tube to the dialyzer, where it is filtered. The blood will then return to your body through a  different tube. Peritoneal dialysis At the start of a dialysis session, your abdomen will be filled with dialysate solution. During dialysis, waste, salt, and extra water in the blood will pass through the peritoneum and into the dialysate solution. The dialysate solution will be drained from the body at the end of the dialysis session. The process of filling and draining the dialysate is called an exchange. Exchanges are repeated until you have used up all of the dialysate solution for that day.         Follow these instructions at home: Eating and drinking  Make changes to your diet as told by your health care provider, such as: ? Limiting your intake of foods that contain a lot of phosphorus and potassium. ? Limiting your fluid and salt intake. ? Add protein to your diet because dialysis removes protein.  Work with a diet and nutrition specialist (dietitian) to make a meal plan that can help improve your dialysis and your health and healthy ways to add calories to your diet because you may not have a good appetite.  Take vitamins made for people with kidney failure. Activity  Rest as told by your health care provider. You may have   less energy.  You may need to limit some physical activities when your belly is full of dialysis solution.  Return to your normal activities as told by your health care provider. Ask your health care provider what activities are safe for you. General instructions  Try to adjust to the dialysis treatment, schedule, and diet. This can take some time. You may need to stop working and may not be able to do some of your normal activities.  You may feel anxious or depressed when starting dialysis. Over time, many people feel better overall because of dialysis. You may be able to return to work after making some changes, such as reducing work intensity.  Take over-the-counter and prescription medicines only as told by your health care provider.  Keep all  follow-up visits. This is important. Where to find more information  National Kidney Foundation: www.kidney.org  American Association of Kidney Patients: www.aakp.org  American Kidney Fund: www.kidneyfund.org Summary  During dialysis, wastes, salt, and extra water are removed from the blood, and the levels of certain minerals in the blood are maintained. There are two types of dialysis: hemodialysis and peritoneal dialysis.  Hemodialysis is when the blood is filtered with a machine and a filter called a dialyzer.  Peritoneal dialysis is when the peritoneum is used as a filter. You may do peritoneal dialysis at home or at almost any other location.  Both types of dialysis have advantages and disadvantages. Talk with your health care provider about which type of dialysis is best for you. This information is not intended to replace advice given to you by your health care provider. Make sure you discuss any questions you have with your health care provider. Document Revised: 10/30/2019 Document Reviewed: 10/30/2019 Elsevier Patient Education  2021 Elsevier Inc.  

## 2020-06-19 NOTE — Progress Notes (Signed)
PT Cancellation Note  Patient Details Name: Scott Castillo MRN: [id number] DOB: 1960/07/11   Cancelled Treatment:    Reason Eval/Treat Not Completed: Patient at procedure or test/unavailable. Pt currently in HD. Will check back as schedule allows to continue with PT POC.    Thelma Comp 06/19/2020, 7:38 AM   Rolinda Roan, PT, DPT Acute Rehabilitation Services Pager: [phone] Office: [phone]

## 2020-06-19 NOTE — Procedures (Signed)
Patient was seen on dialysis and the procedure was supervised.  BFR 300  Via AVF BP is  129/76.   Patient appears to be tolerating treatment well  Scott Castillo 06/19/2020

## 2020-06-19 NOTE — TOC Transition Note (Signed)
Transition of Care [facility]) - CM/SW Discharge Note   Patient Details  Name: Scott Castillo MRN: [id number] Date of Birth: 1960-07-18  Transition of Care [facility]) CM/SW Contact:  Angelita Ingles, RN Phone Number: [phone]  06/19/2020, 3:11 PM   Clinical Narrative:    [facility] consulted for [facility] needs. Choice has been offered to patient.  HH has been set up with [facility].Patient reports that he has no other needs. TOC will sign off.    Final next level of care: [place] Barriers to Discharge: No Barriers Identified   Patient Goals and CMS Choice Patient states their goals for this hospitalization and ongoing recovery are:: Just ready to go home CMS Medicare.gov Compare Post Acute Care list provided to:: Patient Choice offered to / list presented to : Patient  Discharge Placement                       Discharge Plan and Services In-house Referral: NA Discharge Planning Services: CM Consult Post Acute Care Choice: Home Health          DME Arranged: N/A         HH Arranged: PT HH Agency: [place] ([facility]) Date HH Agency Contacted: 06/19/20 Time [place]: 1506 Representative spoke with at [place]: [place] ([place]) Interventions     Readmission Risk Interventions Readmission Risk Prevention Plan 06/19/2020  Transportation Screening Complete  PCP or Specialist Appt within 3-5 Days Complete  HRI or [place] Complete  Social Work Consult for [place] Planning/Counseling Complete  Palliative Care Screening Complete  Medication Review [employer]) Referral to Pharmacy  Some recent data might be hidden

## 2020-06-19 NOTE — Progress Notes (Signed)
Subjective:   Seen in HD-   Pushed to try and do 300 BFR-   Is starting to have the word finding issue that Scott Castillo was having as an OP at HD but no numbness-  Also of note, his BP is down to normal   Objective Vital signs in last 24 hours: Vitals:   06/19/20 0800 06/19/20 0830 06/19/20 0900 06/19/20 0930  BP: 125/70 119/68 117/65 129/76  Pulse: 80 79 83 85  Resp:      Temp:      TempSrc:      SpO2:      Weight:      Height:       Weight change:  No intake or output data in the 24 hours ending 06/19/20 0953 Dialysis Orders:  MWF  - [facility]  4:30hrs, BFR 450, DFR 500,  EDW 111.5kg, 2K/ 2.25Ca-  Been signing off the majority of treatments of late  Access: L AVF Calcitriol 2 mcg PO qHD-last Jul 03, 2020 [name] 210mg  2 tablets TID with meals  Last Labs: Hgb 10.5, TSAT 26 (05/28/20 OP HD), K 4.5, Ca 8.9, P 8.6 (05/28/20 OP HD), PTH 587 (05/28/20 OP HD)   Assessment/ Plan: Scott Castillo is a 60 y.o. yo male with ESRD, DM, HTN who was admitted on 2020-07-03 with confusion after HD  Assessment/Plan: 1. MS change after HD-  Difficult to tease out etiology, has been having numbness and difficulty with speech during HD that lasts for a few hours but MS change was much more pronounced with HD on 2022-07-03-  BP does not drop with HD-  Actually the opposite.  I was concerned about hypertensive encephalopathy vs dialysis dysequilibruim.  BP controlled with IV agents overnight and MS improved.  Then sedating meds were clouding the picture.  Much better this AM-  Again ,  Thinking it is a combo of the above 2. ESRD -had not had effective HD due to sign offs, may have led to higher than normal BUN and resultant dialysis dysequilibrium-  taking this opportunity to do very gentle HD to see if Scott Castillo will tolerate it better - try to get the BUN down.  Did tolerate Tuesday off schedule-  Today we are having the same issues unfortunately with BFR 300 where normal BFR would be 400-450.  I dont think this requires inpatient  management-  Need to figure out how Scott Castillo can handle op HD-  Or maybe needs PD ?  Not able to explain this fully to him today given issues-  Will d/w his primary OP nephrologist  3. Anemia- added  ESA 4. Secondary hyperparathyroidism-  Continue home calcitriol and auryxia now that able to take POs 5. HTN/volume-  BP had been very high-  initially using PRN IV agents until alert enough to take POs. resumed his home coreg and avapro-  BP now may be too low?  Would dec his avapro to 75 q HS  I do not have a problem with discharge later today assuming his sxms clear as they had been doing as OP-  I wanted him to go to OP unit on Sat, then Monday-  Scott Castillo does not want to do-  Will go tomorrow   [name]    Labs: Basic Metabolic Panel: Recent Labs  Lab 07/03/2020 1359 Jul 03, 2020 1420 06/17/20 0246 06/18/20 0029  NA 134* 134* 136 136  K 4.4 4.5 4.3 4.6  CL 96* 100 98 99  CO2 24  --  22 22  GLUCOSE  126* 121* 75 63*  BUN 73* 73* 80* 60*  CREATININE 10.85* 11.10* 12.41* 10.39*  CALCIUM 8.9  --  9.1 9.1  PHOS  --   --  9.2*  --    Liver Function Tests: Recent Labs  Lab 06/16/20 1359 06/17/20 0246 06/18/20 0029  AST 11*  --  35  ALT 16  --  16  ALKPHOS 63  --  55  BILITOT 0.4  --  0.6  PROT 7.2  --  6.6  ALBUMIN 3.6 3.3* 3.1*   No results for input(s): LIPASE, AMYLASE in the last 168 hours. Recent Labs  Lab 06/16/20 1400  AMMONIA 36*   CBC: Recent Labs  Lab 06/16/20 1359 06/16/20 1420 06/17/20 0246 06/18/20 0029  WBC 6.1  --  9.6 8.1  NEUTROABS 5.0  --   --  6.0  HGB 10.4* 10.5* 9.6* 9.9*  HCT 30.6* 31.0* 27.5* 28.4*  MCV 89.5  --  88.7 89.6  PLT 221  --  231 225   Cardiac Enzymes: No results for input(s): CKTOTAL, CKMB, CKMBINDEX, TROPONINI in the last 168 hours. CBG: Recent Labs  Lab 06/18/20 0855 06/18/20 0919 06/18/20 1234 06/18/20 1556 06/18/20 2045  GLUCAP 63* 119* 154* 131* 293*    Iron Studies: No results for input(s): IRON, TIBC, TRANSFERRIN,  FERRITIN in the last 72 hours. Studies/Results: MR BRAIN WO CONTRAST  Result Date: 06/17/2020 CLINICAL DATA:  Confusion, code stroke follow-up EXAM: MRI HEAD WITHOUT CONTRAST TECHNIQUE: Multiplanar, multiecho pulse sequences of the brain and surrounding structures were obtained without intravenous contrast. COMPARISON:  05/03/2020 FINDINGS: Motion artifact is present. Brain: There is no acute infarction or intracranial hemorrhage. There is no intracranial mass, mass effect, or edema. There is no hydrocephalus or extra-axial fluid collection. Prominence of the ventricles and sulci reflects stable parenchymal volume loss greater than expected for age. Vascular: Sheeler vessel flow voids at the skull base are preserved. Skull and upper cervical spine: Normal marrow signal is preserved. Sinuses/Orbits: Mild mucosal thickening.  Left lens replacement. Other: Sella is unremarkable.  Mastoid air cells are clear. IMPRESSION: Motion artifact. No acute infarction, hemorrhage, or mass. Stable parenchymal volume loss greater than expected for age. Electronically Signed   By: Macy Mis M.D.   On: 06/17/2020 16:40   ECHOCARDIOGRAM COMPLETE  Result Date: 06/17/2020    ECHOCARDIOGRAM REPORT   Patient Name:   Scott Castillo Date of Exam: 06/17/2020 Medical Rec #:  [id number]       Height:       72.0 in Accession #:    [id number]      Weight:       255.5 lb Date of Birth:  1960/07/03       BSA:          2.364 m Patient Age:    60 years        BP:           142/94 mmHg Patient Gender: M               HR:           106 bpm. Exam Location:  Inpatient Procedure: 2D Echo, Cardiac Doppler and Color Doppler Indications:    Stroke I63.9  History:        Patient has prior history of Echocardiogram examinations, most                 recent 07/05/2016.  Sonographer:    Bernadene Person RDCS Referring Phys: [phone]  RONDELL A SMITH  Sonographer Comments: Image acquisition challenging due to uncooperative patient. IMPRESSIONS  1. Prominent  trabeculations in LV apex cannot r/o ventricular non compaction consider f/u cardiac MRI if clinicially indicated There is no clear indication for anticoagulation for non compaction in absence of LV dsyfunction or PaF . Left ventricular ejection fraction, by estimation, is 55 to 60%. The left ventricle has normal function. The left ventricle has no regional wall motion abnormalities. Left ventricular diastolic parameters were normal.  2. Right ventricular systolic function is normal. The right ventricular size is normal.  3. Left atrial size was moderately dilated.  4. The mitral valve is abnormal. Mild mitral valve regurgitation. No evidence of mitral stenosis.  5. The aortic valve is tricuspid. There is moderate calcification of the aortic valve. There is moderate thickening of the aortic valve. Aortic valve regurgitation is trivial. Mild to moderate aortic valve sclerosis/calcification is present, without any  evidence of aortic stenosis.  6. The inferior vena cava is normal in size with greater than 50% respiratory variability, suggesting right atrial pressure of 3 mmHg. FINDINGS  Left Ventricle: Prominent trabeculations in LV apex cannot r/o ventricular non compaction consider f/u cardiac MRI if clinicially indicated There is no clear indication for anticoagulation for non compaction in absence of LV dsyfunction or PaF. Left ventricular ejection fraction, by estimation, is 55 to 60%. The left ventricle has normal function. The left ventricle has no regional wall motion abnormalities. The left ventricular internal cavity size was normal in size. There is no left ventricular hypertrophy. Left ventricular diastolic parameters were normal. Right Ventricle: The right ventricular size is normal. No increase in right ventricular wall thickness. Right ventricular systolic function is normal. Left Atrium: Left atrial size was moderately dilated. Right Atrium: Right atrial size was normal in size. Pericardium: There is  no evidence of pericardial effusion. Mitral Valve: The mitral valve is abnormal. There is moderate thickening of the mitral valve leaflet(s). There is mild calcification of the mitral valve leaflet(s). Mild mitral valve regurgitation. No evidence of mitral valve stenosis. Tricuspid Valve: The tricuspid valve is normal in structure. Tricuspid valve regurgitation is not demonstrated. No evidence of tricuspid stenosis. Aortic Valve: The aortic valve is tricuspid. There is moderate calcification of the aortic valve. There is moderate thickening of the aortic valve. Aortic valve regurgitation is trivial. Mild to moderate aortic valve sclerosis/calcification is present, without any evidence of aortic stenosis. Pulmonic Valve: The pulmonic valve was normal in structure. Pulmonic valve regurgitation is not visualized. No evidence of pulmonic stenosis. Aorta: The aortic root is normal in size and structure. Venous: The inferior vena cava is normal in size with greater than 50% respiratory variability, suggesting right atrial pressure of 3 mmHg. IAS/Shunts: No atrial level shunt detected by color flow Doppler.  LEFT VENTRICLE PLAX 2D LVIDd:         5.40 cm  Diastology LVIDs:         3.80 cm  LV e' medial:    7.72 cm/s LV PW:         0.90 cm  LV E/e' medial:  12.7 LV IVS:        1.00 cm  LV e' lateral:   9.25 cm/s LVOT diam:     1.90 cm  LV E/e' lateral: 10.6 LV SV:         61 LV SV Index:   26 LVOT Area:     2.84 cm  RIGHT VENTRICLE RV S prime:  10.40 cm/s TAPSE (M-mode): 2.5 cm LEFT ATRIUM             Index       RIGHT ATRIUM           Index LA diam:        4.70 cm 1.99 cm/m  RA Area:     14.00 cm LA Vol (A2C):   71.5 ml 30.24 ml/m RA Volume:   25.40 ml  10.74 ml/m LA Vol (A4C):   67.2 ml 28.42 ml/m LA Biplane Vol: 70.2 ml 29.69 ml/m  AORTIC VALVE LVOT Vmax:   95.30 cm/s LVOT Vmean:  72.700 cm/s LVOT VTI:    0.215 m  AORTA Ao Root diam: 3.70 cm Ao Asc diam:  3.60 cm MITRAL VALVE MV Area (PHT): 2.97 cm    SHUNTS MV  Decel Time: 255 msec    Systemic VTI:  0.22 m MV E velocity: 97.90 cm/s  Systemic Diam: 1.90 cm MV A velocity: 99.00 cm/s MV E/A ratio:  0.99 Jenkins Rouge MD Electronically signed by Jenkins Rouge MD Signature Date/Time: 06/17/2020/10:50:32 AM    Final    Medications: Infusions:   Scheduled Medications: . aspirin EC  81 mg Oral Daily  . calcitRIOL  2 mcg Oral Q T,Th,Sa-HD  . carvedilol  25 mg Oral BID WC  . Chlorhexidine Gluconate Cloth  6 each Topical Q0600  . Chlorhexidine Gluconate Cloth  6 each Topical Q0600  . Darbepoetin Alfa      . darbepoetin (ARANESP) injection - DIALYSIS  60 mcg Intravenous Q Thu-HD  . ferric citrate  420 mg Oral TID WC  . heparin  5,000 Units Subcutaneous Q8H  . insulin aspart  0-5 Units Subcutaneous QHS  . insulin aspart  0-9 Units Subcutaneous TID WC  . irbesartan  150 mg Oral QHS  . sodium chloride flush  3 mL Intravenous Q12H    have reviewed scheduled and prn medications.  Physical Exam: General: alert, having word finding issues but understands me Heart: RRR Lungs: mostly clear Abdomen: obese, soft, non tender Extremities: min edema Dialysis Access: left AVF-  Patent     06/19/2020,9:53 AM  LOS: 2 days

## 2020-06-19 NOTE — Discharge Summary (Signed)
Physician Discharge Summary  Scott Castillo:[id number] DOB: 04-Aug-1960 DOA: 06/16/2020  PCP: Algis Greenhouse, MD  Admit date: 06/16/2020 Discharge date: 06/19/2020 30 Day Unplanned Readmission Risk Score   Flowsheet Row ED to Hosp-Admission (Current) from 06/16/2020 in [facility]  30 Day Unplanned Readmission Risk Score (%) 23.55 Filed at 06/19/2020 0801     This score is the patient's risk of an unplanned readmission within 30 days of being discharged (0 -100%). The score is based on dignosis, age, lab data, medications, orders, and past utilization.   Low:  0-14.9   Medium: 15-21.9   High: 22-29.9   Extreme: 30 and above         Admitted From: Home Disposition: Home  Recommendations for Outpatient Follow-up:  1. Follow up with PCP in 1-2 weeks 2. Please obtain BMP/CBC in one week 3. Please follow up with your PCP on the following pending results: Unresulted Labs (From admission, onward)          Start     Ordered   Signed and Held  Renal function panel  Once,   R       Question:  Specimen collection method  Answer:  Lab=Lab collect   Signed and Held   Signed and Held  CBC  Once,   R       Question:  Specimen collection method  Answer:  Lab=Lab collect   Signed and Held            Home Health: None Equipment/Devices: None  Discharge Condition: Stable CODE STATUS: Full code Diet recommendation: Renal  Subjective: Patient seen and examined during dialysis today and at that time, he was slightly confused with word finding difficulty however he was seen again after dialysis and he was completely alert and oriented and back to normal.   Brief/Interim Summary: Myrtis Ser Majoris a 60 y.o.malewith medical history significant ofESRD on HD(MWF), DM type II, anemia of chronic disease presented with acute altered mental status during his hemodialysis session. Apparently patient has been on dialysis for 3 years now, but over the last month he has  been dealing with issues with a pinched nerve in his neck.Over the last month patient has had several episodes during dialysis. Apparently he gets pale, nauseous, complains of back/neck pain, numbness in his arms then legs, and subsequently developed slurred speech. On the day of admission, he was able to drive himself to hemodialysis today at 10 AM. However around 10:30 AM he noted to be significantly confused and was unable to talk.   According to wife, he was not even able to recognize her.Symptoms had previously resolved after few hours,but for some reason symptoms persisted longer this time. In the past he had also been able to tell her what was going on normally he is supposed to dialyze 4 hours, but here recently since the symptoms started he has not been able to dialyze more than 2 hours.  ED Course:Upon admission into the emergency department patient was seen to have heart rates 86 107, blood pressure elevated up to 233/104, and all other vital signs maintained.He was evaluated by neurology.CT/CTA of the head neck did not show any large vessel occlusion, but did note a small right insular hypodensity of indeterminate age. Patient was not a TPA candidate as his last known normal was not clear.Patient was noted to show some right sided weakness.Labs significant for hemoglobin 10.4,sodium 134, potassium 4.4, BUN 73, creatinine 10.85, glucose 126,andammonia level 36.  He was noted to be yelling and screaming during his time in the emergency department. He had been given Ativan multiple times, Haldol, labetalol IV, and fentanyl 50 mcg IV.  Patient was subsequently admitted to hospitalist service for acute encephalopathy.  Neurology was consulted.  Initial impression was hypertensive encephalopathy versus dialysis disequilibrium syndrome since patient had presented with blood pressure of 233/104.  EEG was obtained which did not show any epileptiform activity.  Patient's prior to admission  antihypertensives were resumed.  Over the course of next 24 hours, patient improved a little bit more.  MRI was obtained which ruled out any acute stroke or any other structural pathology.  Nephrology was consulted and he received his hemodialysis.  Over the following 24 hours, patient recovered significantly to the point that he was fully alert and oriented and back to himself.  Neurology had opined that patient's presentation was more consistent with dialysis disequilibrium syndrome.  They had cleared the patient for discharge and no further work-up was recommended however nephrology wanted to do another session of dialysis today so he was kept in the hospital yesterday. Interestingly, while he was having dialysis today, once again he had similar symptoms of word finding faculty and slight confusion and right after dialysis was completed, he was completely alert and oriented and back to normal.  According to nephrology's note, they tried to BFR 300 whereas his normal BFR would be 400-4 50.  They did not recommend that patient stay inpatient anymore and they were going to discuss this with patient's primary outpatient nephrologist so they can adjust his dialysis to prevent such symptoms for future.  I discussed with patient himself as well and he understands and is agreeable with discharge home today.  Discharge Diagnoses:  Principal Problem:   Acute metabolic encephalopathy Active Problems:   Diabetes mellitus type 2 in obese [facility])   Hypertensive urgency   Anemia of chronic disease   ESRD (end stage renal disease) ([place])   Aphasia    Discharge Instructions   Allergies as of 06/19/2020      Reactions   Pioglitazone Swelling   Site of swelling not recalled by patient   Adhesive [tape] Other (See Comments)   Causes redness, pt prefers paper tape    Latex Rash   Redness, also      Medication List    TAKE these medications   BD ULTRA-FINE PEN NEEDLES 29G X 12.7MM Misc Generic drug: Insulin  Pen Needle USE AS DIRECTED EVERY DAY   carvedilol 25 MG tablet Commonly known as: COREG Take 25 mg by mouth 2 (two) times daily with a meal.   ferric citrate 1 GM 210 MG(Fe) tablet Commonly known as: AURYXIA Take 210-420 mg by mouth See admin instructions. Take 2 tablets (420 mg) by mouth 3 times daily with meals and 1 tablet (210 mg) with snacks   insulin degludec 200 UNIT/ML FlexTouch Pen Commonly known as: [place] FlexTouch Inject 10 Units into the skin every morning. What changed:   how much to take  when to take this   irbesartan 150 MG tablet Commonly known as: AVAPRO Take 150 mg by mouth daily.   lidocaine-prilocaine cream Commonly known as: EMLA Apply 1 application topically See admin instructions. Apply topically 2 hours before dialysis on Monday, Wednesday, Friday   neomycin-bacitracin-polymyxin ointment Commonly known as: NEOSPORIN Apply 1 application topically daily as needed for wound care. apply to eye   pravastatin 80 MG tablet Commonly known as: PRAVACHOL Take 80 mg by  mouth every evening.   Rybelsus 14 MG Tabs Generic drug: Semaglutide Take 14 mg by mouth daily.   tiZANidine 4 MG tablet Commonly known as: ZANAFLEX Take 4 mg by mouth every 6 (six) hours as needed for muscle spasms.   torsemide 100 MG tablet Commonly known as: DEMADEX Take 100 mg by mouth daily.       Follow-up Information    Dough, Jaymes Graff, MD Follow up in 1 week(s).   Specialty: Family Medicine Contact information: [address] [place] [address] [phone]              Allergies  Allergen Reactions  . Pioglitazone Swelling    Site of swelling not recalled by patient  . Adhesive [Tape] Other (See Comments)    Causes redness, pt prefers paper tape   . Latex Rash    Redness, also    Consultations: Neurology and nephrology   Procedures/Studies: CT Code Stroke CTA Head W/WO contrast  Result Date: 06/16/2020 CLINICAL DATA:  Focal neuro deficit,  greater than 6 hours, stroke suspected. Aphasia, altered mental status EXAM: CT ANGIOGRAPHY HEAD AND NECK TECHNIQUE: Multidetector CT imaging of the head and neck was performed using the standard protocol during bolus administration of intravenous contrast. Multiplanar CT image reconstructions and MIPs were obtained to evaluate the vascular anatomy. Carotid stenosis measurements (when applicable) are obtained utilizing NASCET criteria, using the distal internal carotid diameter as the denominator. CONTRAST:  74mL OMNIPAQUE IOHEXOL 350 MG/ML SOLN COMPARISON:  Noncontrast head CT performed earlier today 06/16/2020. MRI/MRA head 05/03/2020. MRA neck 05/03/2020. FINDINGS: CTA NECK FINDINGS Aortic arch: Standard aortic branching. The visualized aortic arch is unremarkable. Minimal atherosclerotic plaque within the proximal left subclavian artery. No hemodynamically significant innominate or proximal subclavian artery stenosis. Right carotid system: CCA and ICA patent within the neck without stenosis. Minimal atherosclerotic plaque within the carotid bifurcation. Tortuosity of the cervical ICA. Left carotid system: CCA and ICA patent within the neck without stenosis. Mild predominantly soft plaque within the mid to distal ICA. Vertebral arteries: Codominant and patent within the neck without stenosis. Skeleton: No acute bony abnormality or aggressive osseous lesion. Cervical spondylosis with multilevel disc space narrowing, disc bulges, posterior disc osteophytes, uncovertebral hypertrophy. Disc space narrowing is greatest at C5-C6 and C6-C7 (advanced at these levels). Other neck: No neck mass or cervical lymphadenopathy. Upper chest: No consolidation within the imaged lung apices. Review of the MIP images confirms the above findings CTA HEAD FINDINGS Anterior circulation: The intracranial internal carotid arteries are patent. Nonstenotic calcified plaque within these vessels bilaterally. The M1 middle cerebral arteries  are patent. No M2 proximal branch occlusion or high-grade proximal stenosis is identified. The anterior cerebral arteries are patent. No intracranial aneurysm is identified. Posterior circulation: The intracranial vertebral arteries are patent. Mild nonstenotic calcified plaque within both V4 segments. The basilar artery is patent. The posterior cerebral arteries are patent. A right posterior communicating artery is present. The left posterior communicating artery is hypoplastic or absent. Venous sinuses: Within the limitations of contrast timing, no convincing thrombus. Anatomic variants: As described Review of the MIP images confirms the above findings No large vessel occlusion. These results were communicated to Dr. Leonel Ramsay at 1:50 pmon 2/28/2022by text page via the [facility] messaging system. IMPRESSION: CTA neck: Common carotid, internal carotid and vertebral arteries patent within the neck without hemodynamically significant stenosis. Mild atherosclerotic disease within the carotid systems as described. CTA head: 1. No intracranial large vessel occlusion or proximal high-grade arterial stenosis. 2. Nonstenotic  calcified plaque within the intracranial internal carotid arteries and V4 vertebral arteries bilaterally. Electronically Signed   By: Kellie Simmering DO   On: 06/16/2020 13:52   CT Code Stroke CTA Neck W/WO contrast  Result Date: 06/16/2020 CLINICAL DATA:  Focal neuro deficit, greater than 6 hours, stroke suspected. Aphasia, altered mental status EXAM: CT ANGIOGRAPHY HEAD AND NECK TECHNIQUE: Multidetector CT imaging of the head and neck was performed using the standard protocol during bolus administration of intravenous contrast. Multiplanar CT image reconstructions and MIPs were obtained to evaluate the vascular anatomy. Carotid stenosis measurements (when applicable) are obtained utilizing NASCET criteria, using the distal internal carotid diameter as the denominator. CONTRAST:  26mL OMNIPAQUE IOHEXOL  350 MG/ML SOLN COMPARISON:  Noncontrast head CT performed earlier today 06/16/2020. MRI/MRA head 05/03/2020. MRA neck 05/03/2020. FINDINGS: CTA NECK FINDINGS Aortic arch: Standard aortic branching. The visualized aortic arch is unremarkable. Minimal atherosclerotic plaque within the proximal left subclavian artery. No hemodynamically significant innominate or proximal subclavian artery stenosis. Right carotid system: CCA and ICA patent within the neck without stenosis. Minimal atherosclerotic plaque within the carotid bifurcation. Tortuosity of the cervical ICA. Left carotid system: CCA and ICA patent within the neck without stenosis. Mild predominantly soft plaque within the mid to distal ICA. Vertebral arteries: Codominant and patent within the neck without stenosis. Skeleton: No acute bony abnormality or aggressive osseous lesion. Cervical spondylosis with multilevel disc space narrowing, disc bulges, posterior disc osteophytes, uncovertebral hypertrophy. Disc space narrowing is greatest at C5-C6 and C6-C7 (advanced at these levels). Other neck: No neck mass or cervical lymphadenopathy. Upper chest: No consolidation within the imaged lung apices. Review of the MIP images confirms the above findings CTA HEAD FINDINGS Anterior circulation: The intracranial internal carotid arteries are patent. Nonstenotic calcified plaque within these vessels bilaterally. The M1 middle cerebral arteries are patent. No M2 proximal branch occlusion or high-grade proximal stenosis is identified. The anterior cerebral arteries are patent. No intracranial aneurysm is identified. Posterior circulation: The intracranial vertebral arteries are patent. Mild nonstenotic calcified plaque within both V4 segments. The basilar artery is patent. The posterior cerebral arteries are patent. A right posterior communicating artery is present. The left posterior communicating artery is hypoplastic or absent. Venous sinuses: Within the limitations of  contrast timing, no convincing thrombus. Anatomic variants: As described Review of the MIP images confirms the above findings No large vessel occlusion. These results were communicated to Dr. Leonel Ramsay at 1:50 pmon 2/28/2022by text page via the [facility] messaging system. IMPRESSION: CTA neck: Common carotid, internal carotid and vertebral arteries patent within the neck without hemodynamically significant stenosis. Mild atherosclerotic disease within the carotid systems as described. CTA head: 1. No intracranial large vessel occlusion or proximal high-grade arterial stenosis. 2. Nonstenotic calcified plaque within the intracranial internal carotid arteries and V4 vertebral arteries bilaterally. Electronically Signed   By: Kellie Simmering DO   On: 06/16/2020 13:52   MR BRAIN WO CONTRAST  Result Date: 06/17/2020 CLINICAL DATA:  Confusion, code stroke follow-up EXAM: MRI HEAD WITHOUT CONTRAST TECHNIQUE: Multiplanar, multiecho pulse sequences of the brain and surrounding structures were obtained without intravenous contrast. COMPARISON:  05/03/2020 FINDINGS: Motion artifact is present. Brain: There is no acute infarction or intracranial hemorrhage. There is no intracranial mass, mass effect, or edema. There is no hydrocephalus or extra-axial fluid collection. Prominence of the ventricles and sulci reflects stable parenchymal volume loss greater than expected for age. Vascular: Degregory vessel flow voids at the skull base are preserved. Skull and upper cervical  spine: Normal marrow signal is preserved. Sinuses/Orbits: Mild mucosal thickening.  Left lens replacement. Other: Sella is unremarkable.  Mastoid air cells are clear. IMPRESSION: Motion artifact. No acute infarction, hemorrhage, or mass. Stable parenchymal volume loss greater than expected for age. Electronically Signed   By: Macy Mis M.D.   On: 06/17/2020 16:40   EEG adult  Result Date: 06/16/2020 Lora Havens, MD     06/16/2020  7:13 PM Patient Name:  Scott Castillo MRN: [id number] Epilepsy Attending: Lora Havens Referring Physician/Provider: Dr Meredith Staggers Dagar Date: 06/16/2020 Duration: 22.42 mins Patient history: 60 y.o. male with PMHx of HLD on CKD, ESRD, diabetes mellitus type 2, hyperlipidemia, anemia of chronic disease, venous stasis dermatitis of both lower extremities presented with expressive aphasia and altered mental status post hemodialysis. EEG to evaluate for seizure Level of alertness: awake AEDs during EEG study: None Technical aspects: This EEG study was done with scalp electrodes positioned according to the 10-20 International system of electrode placement. Electrical activity was acquired at a sampling rate of 500Hz  and reviewed with a high frequency filter of 70Hz  and a low frequency filter of 1Hz . EEG data were recorded continuously and digitally stored. Description: No posterior dominant rhythm was seen.  EEG showed continuous generalized 3 to 6 Hz theta-delta slowing. Hyperventilation and photic stimulation were not performed.   ABNORMALITY -Continuous slow, generalized IMPRESSION: This study is suggestive of moderate diffuse encephalopathy, nonspecific etiology. No seizures or epileptiform discharges were seen throughout the recording. Lora Havens   ECHOCARDIOGRAM COMPLETE  Result Date: 06/17/2020    ECHOCARDIOGRAM REPORT   Patient Name:   NICKSON MIDDLESWORTH Broady Date of Exam: 06/17/2020 Medical Rec #:  [id number]       Height:       72.0 in Accession #:    [id number]      Weight:       255.5 lb Date of Birth:  09/02/1960       BSA:          2.364 m Patient Age:    34 years        BP:           142/94 mmHg Patient Gender: M               HR:           106 bpm. Exam Location:  Inpatient Procedure: 2D Echo, Cardiac Doppler and Color Doppler Indications:    Stroke I63.9  History:        Patient has prior history of Echocardiogram examinations, most                 recent 07/05/2016.  Sonographer:    Bernadene Person RDCS Referring Phys: [id number]  RONDELL A SMITH  Sonographer Comments: Image acquisition challenging due to uncooperative patient. IMPRESSIONS  1. Prominent trabeculations in LV apex cannot r/o ventricular non compaction consider f/u cardiac MRI if clinicially indicated There is no clear indication for anticoagulation for non compaction in absence of LV dsyfunction or PaF . Left ventricular ejection fraction, by estimation, is 55 to 60%. The left ventricle has normal function. The left ventricle has no regional wall motion abnormalities. Left ventricular diastolic parameters were normal.  2. Right ventricular systolic function is normal. The right ventricular size is normal.  3. Left atrial size was moderately dilated.  4. The mitral valve is abnormal. Mild mitral valve regurgitation. No evidence of mitral stenosis.  5. The aortic valve is tricuspid. There is  moderate calcification of the aortic valve. There is moderate thickening of the aortic valve. Aortic valve regurgitation is trivial. Mild to moderate aortic valve sclerosis/calcification is present, without any  evidence of aortic stenosis.  6. The inferior vena cava is normal in size with greater than 50% respiratory variability, suggesting right atrial pressure of 3 mmHg. FINDINGS  Left Ventricle: Prominent trabeculations in LV apex cannot r/o ventricular non compaction consider f/u cardiac MRI if clinicially indicated There is no clear indication for anticoagulation for non compaction in absence of LV dsyfunction or PaF. Left ventricular ejection fraction, by estimation, is 55 to 60%. The left ventricle has normal function. The left ventricle has no regional wall motion abnormalities. The left ventricular internal cavity size was normal in size. There is no left ventricular hypertrophy. Left ventricular diastolic parameters were normal. Right Ventricle: The right ventricular size is normal. No increase in right ventricular wall thickness. Right ventricular systolic function is normal. Left  Atrium: Left atrial size was moderately dilated. Right Atrium: Right atrial size was normal in size. Pericardium: There is no evidence of pericardial effusion. Mitral Valve: The mitral valve is abnormal. There is moderate thickening of the mitral valve leaflet(s). There is mild calcification of the mitral valve leaflet(s). Mild mitral valve regurgitation. No evidence of mitral valve stenosis. Tricuspid Valve: The tricuspid valve is normal in structure. Tricuspid valve regurgitation is not demonstrated. No evidence of tricuspid stenosis. Aortic Valve: The aortic valve is tricuspid. There is moderate calcification of the aortic valve. There is moderate thickening of the aortic valve. Aortic valve regurgitation is trivial. Mild to moderate aortic valve sclerosis/calcification is present, without any evidence of aortic stenosis. Pulmonic Valve: The pulmonic valve was normal in structure. Pulmonic valve regurgitation is not visualized. No evidence of pulmonic stenosis. Aorta: The aortic root is normal in size and structure. Venous: The inferior vena cava is normal in size with greater than 50% respiratory variability, suggesting right atrial pressure of 3 mmHg. IAS/Shunts: No atrial level shunt detected by color flow Doppler.  LEFT VENTRICLE PLAX 2D LVIDd:         5.40 cm  Diastology LVIDs:         3.80 cm  LV e' medial:    7.72 cm/s LV PW:         0.90 cm  LV E/e' medial:  12.7 LV IVS:        1.00 cm  LV e' lateral:   9.25 cm/s LVOT diam:     1.90 cm  LV E/e' lateral: 10.6 LV SV:         61 LV SV Index:   26 LVOT Area:     2.84 cm  RIGHT VENTRICLE RV S prime:     10.40 cm/s TAPSE (M-mode): 2.5 cm LEFT ATRIUM             Index       RIGHT ATRIUM           Index LA diam:        4.70 cm 1.99 cm/m  RA Area:     14.00 cm LA Vol (A2C):   71.5 ml 30.24 ml/m RA Volume:   25.40 ml  10.74 ml/m LA Vol (A4C):   67.2 ml 28.42 ml/m LA Biplane Vol: 70.2 ml 29.69 ml/m  AORTIC VALVE LVOT Vmax:   95.30 cm/s LVOT Vmean:  72.700  cm/s LVOT VTI:    0.215 m  AORTA Ao Root diam: 3.70 cm Ao Asc diam:  3.60 cm MITRAL VALVE MV Area (PHT): 2.97 cm    SHUNTS MV Decel Time: 255 msec    Systemic VTI:  0.22 m MV E velocity: 97.90 cm/s  Systemic Diam: 1.90 cm MV A velocity: 99.00 cm/s MV E/A ratio:  0.99 Jenkins Rouge MD Electronically signed by Jenkins Rouge MD Signature Date/Time: 06/17/2020/10:50:32 AM    Final    CT HEAD CODE STROKE WO CONTRAST  Result Date: 06/16/2020 CLINICAL DATA:  Code stroke.  Neuro deficit, acute, stroke suspected EXAM: CT HEAD WITHOUT CONTRAST TECHNIQUE: Contiguous axial images were obtained from the base of the skull through the vertex without intravenous contrast. COMPARISON:  05/09/2020 and prior. FINDINGS: Please note that image quality is degraded by motion artifact. Brain: No acute infarct or intracranial hemorrhage. No mass lesion. No midline shift, ventriculomegaly or extra-axial fluid collection. Mild cerebral atrophy with ex vacuo dilatation. Small right insular hypodensity. Vascular: No hyperdense vessel or unexpected calcification. Bilateral skull base atherosclerotic calcifications. Skull: Negative for fracture or focal lesion. Sinuses/Orbits: No acute finding. Other: None. ASPECTS [facility] Stroke Program Early CT Score) - Ganglionic level infarction (caudate, lentiform nuclei, internal capsule, insula, M1-M3 cortex): 6 - Supraganglionic infarction (M4-M6 cortex): 3 Total score (0-10 with 10 being normal): 9 IMPRESSION: 1. Small right insular hypodensity may reflect age indeterminate lacunar insult versus prominent sulcus. Motion degraded exam. 2. Mild cerebral atrophy. 3. ASPECTS is 9 4. Code stroke imaging results were communicated on 06/16/2020 at 1:18 pm to provider Dr. Leonel Ramsay via secure text paging. Electronically Signed   By: Primitivo Gauze M.D.   On: 06/16/2020 13:21      Discharge Exam: Vitals:   06/19/20 1000 06/19/20 1016  BP: 140/76 140/76  Pulse: 87 83  Resp:    Temp:    SpO2:      Vitals:   06/19/20 0900 06/19/20 0930 06/19/20 1000 06/19/20 1016  BP: 117/65 129/76 140/76 140/76  Pulse: 83 85 87 83  Resp:      Temp:      TempSrc:      SpO2:      Weight:    112.7 kg  Height:        General: Pt is alert, awake, not in acute distress Cardiovascular: RRR, S1/S2 +, no rubs, no gallops Respiratory: CTA bilaterally, no wheezing, no rhonchi Abdominal: Soft, NT, ND, bowel sounds + Extremities: no edema, no cyanosis    The results of significant diagnostics from this hospitalization (including imaging, microbiology, ancillary and laboratory) are listed below for reference.     Microbiology: Recent Results (from the past 240 hour(s))  Resp Panel by RT-PCR (Flu A&B, Covid) Nasopharyngeal Swab     Status: None   Collection Time: 06/16/20 12:44 PM   Specimen: Nasopharyngeal Swab; Nasopharyngeal(NP) swabs in vial transport medium  Result Value Ref Range Status   SARS Coronavirus 2 by RT PCR NEGATIVE NEGATIVE Final    Comment: (NOTE) SARS-CoV-2 target nucleic acids are NOT DETECTED.  The SARS-CoV-2 RNA is generally detectable in upper respiratory specimens during the acute phase of infection. The lowest concentration of SARS-CoV-2 viral copies this assay can detect is 138 copies/mL. A negative result does not preclude SARS-Cov-2 infection and should not be used as the sole basis for treatment or other patient management decisions. A negative result may occur with  improper specimen collection/handling, submission of specimen other than nasopharyngeal swab, presence of viral mutation(s) within the areas targeted by this assay, and inadequate number of viral copies(<138 copies/mL). A negative result  must be combined with clinical observations, patient history, and epidemiological information. The expected result is Negative.  Fact Sheet for Patients:  EntrepreneurPulse.com.au  Fact Sheet for Healthcare Providers:   IncredibleEmployment.be  This test is no t yet approved or cleared by the [place] FDA and  has been authorized for detection and/or diagnosis of SARS-CoV-2 by FDA under an Emergency Use Authorization (EUA). This EUA will remain  in effect (meaning this test can be used) for the duration of the COVID-19 declaration under Section 564(b)(1) of the Act, 21 U.S.C.section 360bbb-3(b)(1), unless the authorization is terminated  or revoked sooner.       Influenza A by PCR NEGATIVE NEGATIVE Final   Influenza B by PCR NEGATIVE NEGATIVE Final    Comment: (NOTE) The Xpert Xpress SARS-CoV-2/FLU/RSV plus assay is intended as an aid in the diagnosis of influenza from Nasopharyngeal swab specimens and should not be used as a sole basis for treatment. Nasal washings and aspirates are unacceptable for Xpert Xpress SARS-CoV-2/FLU/RSV testing.  Fact Sheet for Patients: EntrepreneurPulse.com.au  Fact Sheet for Healthcare Providers: IncredibleEmployment.be  This test is not yet approved or cleared by the [place] FDA and has been authorized for detection and/or diagnosis of SARS-CoV-2 by FDA under an Emergency Use Authorization (EUA). This EUA will remain in effect (meaning this test can be used) for the duration of the COVID-19 declaration under Section 564(b)(1) of the Act, 21 U.S.C. section 360bbb-3(b)(1), unless the authorization is terminated or revoked.  Performed at [facility], [place] [address], [place], [place] [address]   C Difficile Quick Screen w PCR reflex     Status: None   Collection Time: 06/17/20  6:50 PM   Specimen: STOOL  Result Value Ref Range Status   C Diff antigen NEGATIVE NEGATIVE Final   C Diff toxin NEGATIVE NEGATIVE Final   C Diff interpretation No C. difficile detected.  Final    Comment: Performed at [facility], [place] [address] [place], [place] [address]     Labs: BNP (last 3  results) No results for input(s): BNP in the last 8760 hours. Basic Metabolic Panel: Recent Labs  Lab 06/16/20 1359 06/16/20 1420 06/17/20 0246 06/18/20 0029  NA 134* 134* 136 136  K 4.4 4.5 4.3 4.6  CL 96* 100 98 99  CO2 24  --  22 22  GLUCOSE 126* 121* 75 63*  BUN 73* 73* 80* 60*  CREATININE 10.85* 11.10* 12.41* 10.39*  CALCIUM 8.9  --  9.1 9.1  PHOS  --   --  9.2*  --    Liver Function Tests: Recent Labs  Lab 06/16/20 1359 06/17/20 0246 06/18/20 0029  AST 11*  --  35  ALT 16  --  16  ALKPHOS 63  --  55  BILITOT 0.4  --  0.6  PROT 7.2  --  6.6  ALBUMIN 3.6 3.3* 3.1*   No results for input(s): LIPASE, AMYLASE in the last 168 hours. Recent Labs  Lab 06/16/20 1400  AMMONIA 36*   CBC: Recent Labs  Lab 06/16/20 1359 06/16/20 1420 06/17/20 0246 06/18/20 0029  WBC 6.1  --  9.6 8.1  NEUTROABS 5.0  --   --  6.0  HGB 10.4* 10.5* 9.6* 9.9*  HCT 30.6* 31.0* 27.5* 28.4*  MCV 89.5  --  88.7 89.6  PLT 221  --  231 225   Cardiac Enzymes: No results for input(s): CKTOTAL, CKMB, CKMBINDEX, TROPONINI in the last 168 hours. BNP:  Invalid input(s): POCBNP CBG: Recent Labs  Lab 06/18/20 0919 06/18/20 1234 06/18/20 1556 06/18/20 2045 06/19/20 1137  GLUCAP 119* 154* 131* 293* 124*   D-Dimer No results for input(s): DDIMER in the last 72 hours. Hgb A1c Recent Labs    06/17/20 0246  HGBA1C 8.0*   Lipid Profile No results for input(s): CHOL, HDL, LDLCALC, TRIG, CHOLHDL, LDLDIRECT in the last 72 hours. Thyroid function studies Recent Labs    06/17/20 0246  TSH 0.789   Anemia work up No results for input(s): VITAMINB12, FOLATE, FERRITIN, TIBC, IRON, RETICCTPCT in the last 72 hours. Urinalysis    Component Value Date/Time   COLORURINE YELLOW 06/16/2020 [place] 06/16/2020 1244   LABSPEC 1.013 06/16/2020 1244   PHURINE 8.0 06/16/2020 1244   GLUCOSEU 150 (A) 06/16/2020 1244   HGBUR SMALL (A) 06/16/2020 [place]  06/16/2020 [place] 06/16/2020 1244   PROTEINUR >=300 (A) 06/16/2020 1244   NITRITE NEGATIVE 06/16/2020 1244   LEUKOCYTESUR TRACE (A) 06/16/2020 1244   Sepsis Labs Invalid input(s): PROCALCITONIN,  WBC,  LACTICIDVEN Microbiology Recent Results (from the past 240 hour(s))  Resp Panel by RT-PCR (Flu A&B, Covid) Nasopharyngeal Swab     Status: None   Collection Time: 06/16/20 12:44 PM   Specimen: Nasopharyngeal Swab; Nasopharyngeal(NP) swabs in vial transport medium  Result Value Ref Range Status   SARS Coronavirus 2 by RT PCR NEGATIVE NEGATIVE Final    Comment: (NOTE) SARS-CoV-2 target nucleic acids are NOT DETECTED.  The SARS-CoV-2 RNA is generally detectable in upper respiratory specimens during the acute phase of infection. The lowest concentration of SARS-CoV-2 viral copies this assay can detect is 138 copies/mL. A negative result does not preclude SARS-Cov-2 infection and should not be used as the sole basis for treatment or other patient management decisions. A negative result may occur with  improper specimen collection/handling, submission of specimen other than nasopharyngeal swab, presence of viral mutation(s) within the areas targeted by this assay, and inadequate number of viral copies(<138 copies/mL). A negative result must be combined with clinical observations, patient history, and epidemiological information. The expected result is Negative.  Fact Sheet for Patients:  EntrepreneurPulse.com.au  Fact Sheet for Healthcare Providers:  IncredibleEmployment.be  This test is no t yet approved or cleared by the [place] FDA and  has been authorized for detection and/or diagnosis of SARS-CoV-2 by FDA under an Emergency Use Authorization (EUA). This EUA will remain  in effect (meaning this test can be used) for the duration of the COVID-19 declaration under Section 564(b)(1) of the Act, 21 U.S.C.section  360bbb-3(b)(1), unless the authorization is terminated  or revoked sooner.       Influenza A by PCR NEGATIVE NEGATIVE Final   Influenza B by PCR NEGATIVE NEGATIVE Final    Comment: (NOTE) The Xpert Xpress SARS-CoV-2/FLU/RSV plus assay is intended as an aid in the diagnosis of influenza from Nasopharyngeal swab specimens and should not be used as a sole basis for treatment. Nasal washings and aspirates are unacceptable for Xpert Xpress SARS-CoV-2/FLU/RSV testing.  Fact Sheet for Patients: EntrepreneurPulse.com.au  Fact Sheet for Healthcare Providers: IncredibleEmployment.be  This test is not yet approved or cleared by the [place] FDA and has been authorized for detection and/or diagnosis of SARS-CoV-2 by FDA under an Emergency Use Authorization (EUA). This EUA will remain in effect (meaning this test can be used) for the duration of the COVID-19 declaration under Section 564(b)(1) of the Act, 21 U.S.C.  section 360bbb-3(b)(1), unless the authorization is terminated or revoked.  Performed at [facility], [place] [address], [place], [place] [address]   C Difficile Quick Screen w PCR reflex     Status: None   Collection Time: 06/17/20  6:50 PM   Specimen: STOOL  Result Value Ref Range Status   C Diff antigen NEGATIVE NEGATIVE Final   C Diff toxin NEGATIVE NEGATIVE Final   C Diff interpretation No C. difficile detected.  Final    Comment: Performed at [facility], [place] [address] [place], [place] [address]     Time coordinating discharge: Over 30 minutes  SIGNED:   Darliss Cheney, MD  [facility] 06/19/2020, 12:00 PM  If 7PM-7AM, please contact night-coverage [email or web address]

## 2020-06-19 NOTE — Progress Notes (Signed)
Pt off unit at time of report.

## 2020-06-19 NOTE — Progress Notes (Signed)
Occupational Therapy Treatment Patient Details Name: Scott Castillo MRN: [id number] DOB: [birth year] Today's Date: 06/19/2020    History of present illness 60 yo admitted 2/28 with AMS and expressive aphasia after HD. EEG demonstrates encephalopathy. PMhx: ESRD, DM, HLD, anemia, dermatitis   OT comments  Pt progressing well towards OT goals and assisted in ADLs in prep for DC home today. Pt overall Setup for UB ADLs and min guard for LB ADLs in standing. Pt noted with poor standing balance without use of AD, frequent posterior LOB. Guided pt in RW use for mobility with much improved steadiness. Pt has Rollator at home - encouraged pt to use this to decrease fall risk. Pt A&Ox4 with improved mentation though continued difficulty sequencing and slower processing noted. Anticipate this to continue improving with return to normal routine at home. Pt has progressed to no longer need OT follow-up though still may benefit from HHPT follow-up to optimize balance and gait. Recommended pt to have someone present with showering tasks, mobility initially until endurance/steadiness improves with pt agreeable. Pt left sitting EOB with RN present to review DC packet with pt.    Follow Up Recommendations  No OT follow up;Supervision - Intermittent (OOB/mobility, shower transfers)    Equipment Recommendations  None recommended by OT    Recommendations for Other Services      Precautions / Restrictions Precautions Precautions: Fall Restrictions Weight Bearing Restrictions: No       Mobility Bed Mobility Overal bed mobility: Modified Independent                  Transfers Overall transfer level: Needs assistance Equipment used: Rolling walker (2 wheeled) Transfers: Sit to/from Stand Sit to Stand: Min guard         General transfer comment: min guard for sit to stand at bedside. Without AD for dressing tasks, noted with posterior LOB and difficulty correcting. Guided pt in RW use with  short mobility and improved steadiness noted. Cues for RW use/manuevering as pt with wide turns using this device    Balance Overall balance assessment: Needs assistance   Sitting balance-Leahy Scale: Fair     Standing balance support: Single extremity supported;Bilateral upper extremity supported Standing balance-Leahy Scale: Poor Standing balance comment: UE support needed to maintain standing balance                           ADL either performed or assessed with clinical judgement   ADL Overall ADL's : Needs assistance/impaired                 Upper Body Dressing : Set up;Sitting   Lower Body Dressing: Min guard;Sit to/from stand Lower Body Dressing Details (indicate cue type and reason): min guard for sit to stand to don pants/underwear, poor balance when standing unsupported             Functional mobility during ADLs: Min guard;Rolling walker;Cueing for sequencing General ADL Comments: Still with cognitive deficits of problem solving, sequencing though improving. Pt with poor standing balance unsupported with difficulty correcting. Improved with use of RW and minor cues for RW manuevering     Vision   Vision Assessment?: Vision impaired- to be further tested in functional context Additional Comments: Hx of L eye bleeding   Perception     Praxis      Cognition Arousal/Alertness: Awake/alert Behavior During Therapy: WFL for tasks assessed/performed Overall Cognitive Status: Impaired/Different from baseline Area of Impairment: Safety/judgement;Problem  solving;Awareness;Following commands                       Following Commands: Follows one step commands with increased time;Follows multi-step commands with increased time Safety/Judgement: Decreased awareness of deficits;Decreased awareness of safety Awareness: Emergent Problem Solving: Slow processing;Requires verbal cues;Difficulty sequencing General Comments: Pt A&Ox4, conversive and  improved mentation though with still decreased awareness of deficits (especially balance). Pt continues with slower processing and slight difficulty sequencing dressing tasks but able to complete with min cues or increased time for problem solving        Exercises     Shoulder Instructions       General Comments Collab with NT for removal of condom cath in prep for dressing tasks to go home    Pertinent Vitals/ Pain       Pain Assessment: No/denies pain  Home Living                                          Prior Functioning/Environment              Frequency  Min 2X/week        Progress Toward Goals  OT Goals(current goals can now be found in the care plan section)  Progress towards OT goals: Progressing toward goals  Acute Rehab OT Goals Patient Stated Goal: return home OT Goal Formulation: With patient Time For Goal Achievement: 07/01/20 Potential to Achieve Goals: Good ADL Goals Pt Will Perform Grooming: with modified independence;standing Pt Will Perform Lower Body Bathing: with modified independence;sitting/lateral leans;sit to/from stand Pt Will Perform Lower Body Dressing: with modified independence;sitting/lateral leans;sit to/from stand Pt Will Transfer to Toilet: with modified independence;ambulating Pt Will Perform Toileting - Clothing Manipulation and hygiene: with modified independence;sitting/lateral leans;sit to/from stand Additional ADL Goal #1: Pt to demo ability to visually identify > 3 ADL items without verbal cues during functional task  Plan Discharge plan needs to be updated    Co-evaluation                 AM-PAC OT "6 Clicks" Daily Activity     Outcome Measure   Help from another person eating meals?: None Help from another person taking care of personal grooming?: A Little Help from another person toileting, which includes using toliet, bedpan, or urinal?: A Little Help from another person bathing (including  washing, rinsing, drying)?: A Little Help from another person to put on and taking off regular upper body clothing?: A Little Help from another person to put on and taking off regular lower body clothing?: A Little 6 Click Score: 19    End of Session Equipment Utilized During Treatment: Gait belt;Rolling walker  OT Visit Diagnosis: Unsteadiness on feet (R26.81);Other abnormalities of gait and mobility (R26.89);Muscle weakness (generalized) (M62.81);Other symptoms and signs involving cognitive function   Activity Tolerance Patient tolerated treatment well   Patient Left in bed;with nursing/sitter in room;Other (comment) (sitting EOB with RN present to go over DC packet)   Nurse Communication Mobility status        Time: 1356-1416 OT Time Calculation (min): 20 min  Charges: OT General Charges $OT Visit: 1 Visit OT Treatments $Self Care/Home Management : 8-22 mins  Malachy Chamber, OTR/L Acute Rehab Services Office: [phone]   Layla Maw 06/19/2020, 2:27 PM

## 2020-06-21 ENCOUNTER — Telehealth: Payer: Self-pay | Admitting: Nephrology

## 2020-06-21 NOTE — Telephone Encounter (Signed)
Transition of care contact from inpatient facility  Date of Discharge: 06/19/20 Date of Contact: 03/05/2  Method of contact: Phone  Attempted to contact patient to discuss transition of care from inpatient admission. Patient did not answer the phone. Message was left on the patient's voicemail with call back number [phone].   No answer or message answering  machine  available

## 2020-07-23 ENCOUNTER — Emergency Department (HOSPITAL_COMMUNITY)
Admission: EM | Admit: 2020-07-23 | Discharge: 2020-07-23 | Discharge status: Against Medical Advice | Payer: Medicare Other

## 2020-07-23 NOTE — ED Notes (Signed)
Pt signed MSE form after acknowledging risk of leaving.

## 2020-08-15 ENCOUNTER — Encounter (HOSPITAL_COMMUNITY): Payer: Self-pay

## 2020-08-15 ENCOUNTER — Emergency Department (HOSPITAL_COMMUNITY): Payer: Medicare Other

## 2020-08-15 ENCOUNTER — Inpatient Hospital Stay (HOSPITAL_COMMUNITY)
Admission: EM | Admit: 2020-08-15 | Discharge: 2020-08-19 | DRG: 640 | Disposition: A | Discharge status: Home / Self Care | Payer: Medicare Other | Attending: Internal Medicine | Admitting: Internal Medicine

## 2020-08-15 ENCOUNTER — Other Ambulatory Visit: Payer: Self-pay

## 2020-08-15 DIAGNOSIS — E1141 Type 2 diabetes mellitus with diabetic mononeuropathy: Secondary | ICD-10-CM | POA: Diagnosis present

## 2020-08-15 DIAGNOSIS — I120 Hypertensive chronic kidney disease with stage 5 chronic kidney disease or end stage renal disease: Secondary | ICD-10-CM | POA: Diagnosis present

## 2020-08-15 DIAGNOSIS — E1169 Type 2 diabetes mellitus with other specified complication: Secondary | ICD-10-CM | POA: Diagnosis present

## 2020-08-15 DIAGNOSIS — Z992 Dependence on renal dialysis: Secondary | ICD-10-CM

## 2020-08-15 DIAGNOSIS — E1122 Type 2 diabetes mellitus with diabetic chronic kidney disease: Secondary | ICD-10-CM | POA: Diagnosis present

## 2020-08-15 DIAGNOSIS — I872 Venous insufficiency (chronic) (peripheral): Secondary | ICD-10-CM | POA: Diagnosis present

## 2020-08-15 DIAGNOSIS — N179 Acute kidney failure, unspecified: Secondary | ICD-10-CM | POA: Diagnosis not present

## 2020-08-15 DIAGNOSIS — G9341 Metabolic encephalopathy: Secondary | ICD-10-CM | POA: Diagnosis present

## 2020-08-15 DIAGNOSIS — E669 Obesity, unspecified: Secondary | ICD-10-CM | POA: Diagnosis present

## 2020-08-15 DIAGNOSIS — Z8249 Family history of ischemic heart disease and other diseases of the circulatory system: Secondary | ICD-10-CM

## 2020-08-15 DIAGNOSIS — E875 Hyperkalemia: Secondary | ICD-10-CM | POA: Diagnosis not present

## 2020-08-15 DIAGNOSIS — N186 End stage renal disease: Secondary | ICD-10-CM | POA: Diagnosis present

## 2020-08-15 DIAGNOSIS — Z888 Allergy status to other drugs, medicaments and biological substances status: Secondary | ICD-10-CM

## 2020-08-15 DIAGNOSIS — Z794 Long term (current) use of insulin: Secondary | ICD-10-CM

## 2020-08-15 DIAGNOSIS — E11649 Type 2 diabetes mellitus with hypoglycemia without coma: Secondary | ICD-10-CM | POA: Diagnosis present

## 2020-08-15 DIAGNOSIS — N189 Chronic kidney disease, unspecified: Secondary | ICD-10-CM | POA: Diagnosis not present

## 2020-08-15 DIAGNOSIS — Z91040 Latex allergy status: Secondary | ICD-10-CM

## 2020-08-15 DIAGNOSIS — M5412 Radiculopathy, cervical region: Secondary | ICD-10-CM | POA: Diagnosis present

## 2020-08-15 DIAGNOSIS — E1142 Type 2 diabetes mellitus with diabetic polyneuropathy: Secondary | ICD-10-CM | POA: Diagnosis present

## 2020-08-15 DIAGNOSIS — E1149 Type 2 diabetes mellitus with other diabetic neurological complication: Secondary | ICD-10-CM | POA: Diagnosis present

## 2020-08-15 DIAGNOSIS — Z79899 Other long term (current) drug therapy: Secondary | ICD-10-CM

## 2020-08-15 DIAGNOSIS — E162 Hypoglycemia, unspecified: Secondary | ICD-10-CM | POA: Diagnosis not present

## 2020-08-15 DIAGNOSIS — E785 Hyperlipidemia, unspecified: Secondary | ICD-10-CM | POA: Diagnosis present

## 2020-08-15 DIAGNOSIS — Z9115 Patient's noncompliance with renal dialysis: Secondary | ICD-10-CM

## 2020-08-15 DIAGNOSIS — G901 Familial dysautonomia [Riley-Day]: Secondary | ICD-10-CM | POA: Diagnosis present

## 2020-08-15 DIAGNOSIS — E8770 Fluid overload, unspecified: Secondary | ICD-10-CM | POA: Diagnosis present

## 2020-08-15 DIAGNOSIS — Z8507 Personal history of malignant neoplasm of pancreas: Secondary | ICD-10-CM

## 2020-08-15 DIAGNOSIS — Z6834 Body mass index (BMI) 34.0-34.9, adult: Secondary | ICD-10-CM

## 2020-08-15 DIAGNOSIS — Z20822 Contact with and (suspected) exposure to covid-19: Secondary | ICD-10-CM | POA: Diagnosis present

## 2020-08-15 DIAGNOSIS — R4701 Aphasia: Secondary | ICD-10-CM | POA: Diagnosis present

## 2020-08-15 DIAGNOSIS — D631 Anemia in chronic kidney disease: Secondary | ICD-10-CM | POA: Diagnosis present

## 2020-08-15 DIAGNOSIS — M898X9 Other specified disorders of bone, unspecified site: Secondary | ICD-10-CM | POA: Diagnosis present

## 2020-08-15 DIAGNOSIS — N2581 Secondary hyperparathyroidism of renal origin: Secondary | ICD-10-CM | POA: Diagnosis present

## 2020-08-15 HISTORY — DX: Essential (primary) hypertension: I10

## 2020-08-15 HISTORY — DX: Personal history of malignant neoplasm of pancreas: Z85.07

## 2020-08-15 HISTORY — DX: Radiculopathy, lumbar region: M54.16

## 2020-08-15 HISTORY — DX: Type 2 diabetes mellitus with other diabetic neurological complication: E11.49

## 2020-08-15 HISTORY — DX: Radiculopathy, cervical region: M54.12

## 2020-08-15 HISTORY — DX: Hyperlipidemia, unspecified: E78.5

## 2020-08-15 HISTORY — DX: Carpal tunnel syndrome, bilateral upper limbs: G56.03

## 2020-08-15 LAB — CBC WITH DIFFERENTIAL/PLATELET
Abs Immature Granulocytes: 0.07 10*3/uL (ref 0.00–0.07)
Basophils Absolute: 0 10*3/uL (ref 0.0–0.1)
Basophils Relative: 0 %
Eosinophils Absolute: 0.1 10*3/uL (ref 0.0–0.5)
Eosinophils Relative: 1 %
HCT: 34 % — ABNORMAL LOW (ref 39.0–52.0)
Hemoglobin: 11.3 g/dL — ABNORMAL LOW (ref 13.0–17.0)
Immature Granulocytes: 1 %
Lymphocytes Relative: 6 %
Lymphs Abs: 0.7 10*3/uL (ref 0.7–4.0)
MCH: 31 pg (ref 26.0–34.0)
MCHC: 33.2 g/dL (ref 30.0–36.0)
MCV: 93.2 fL (ref 80.0–100.0)
Monocytes Absolute: 0.9 10*3/uL (ref 0.1–1.0)
Monocytes Relative: 8 %
Neutro Abs: 9.5 10*3/uL — ABNORMAL HIGH (ref 1.7–7.7)
Neutrophils Relative %: 84 %
Platelets: 226 10*3/uL (ref 150–400)
RBC: 3.65 MIL/uL — ABNORMAL LOW (ref 4.22–5.81)
RDW: 13.3 % (ref 11.5–15.5)
WBC: 11.4 10*3/uL — ABNORMAL HIGH (ref 4.0–10.5)
nRBC: 0 % (ref 0.0–0.2)

## 2020-08-15 LAB — CBC
HCT: 31.3 % — ABNORMAL LOW (ref 39.0–52.0)
Hemoglobin: 10.8 g/dL — ABNORMAL LOW (ref 13.0–17.0)
MCH: 30.7 pg (ref 26.0–34.0)
MCHC: 34.5 g/dL (ref 30.0–36.0)
MCV: 88.9 fL (ref 80.0–100.0)
Platelets: 210 10*3/uL (ref 150–400)
RBC: 3.52 MIL/uL — ABNORMAL LOW (ref 4.22–5.81)
RDW: 13.2 % (ref 11.5–15.5)
WBC: 8.9 10*3/uL (ref 4.0–10.5)
nRBC: 0 % (ref 0.0–0.2)

## 2020-08-15 LAB — BASIC METABOLIC PANEL
Anion gap: 18 — ABNORMAL HIGH (ref 5–15)
BUN: 115 mg/dL — ABNORMAL HIGH (ref 6–20)
CO2: 17 mmol/L — ABNORMAL LOW (ref 22–32)
Calcium: 8.4 mg/dL — ABNORMAL LOW (ref 8.9–10.3)
Chloride: 99 mmol/L (ref 98–111)
Creatinine, Ser: 16.6 mg/dL — ABNORMAL HIGH (ref 0.61–1.24)
GFR, Estimated: 3 mL/min — ABNORMAL LOW (ref >60)
Glucose, Bld: 71 mg/dL (ref 70–99)
Potassium: 6.2 mmol/L — ABNORMAL HIGH (ref 3.5–5.1)
Sodium: 134 mmol/L — ABNORMAL LOW (ref 135–145)

## 2020-08-15 LAB — AMMONIA: Ammonia: 32 umol/L (ref 9–35)

## 2020-08-15 LAB — RESP PANEL BY RT-PCR (FLU A&B, COVID) ARPGX2
Influenza A by PCR: NEGATIVE
Influenza B by PCR: NEGATIVE
SARS Coronavirus 2 by RT PCR: NEGATIVE

## 2020-08-15 LAB — HEPATITIS B SURFACE ANTIGEN: Hepatitis B Surface Ag: NONREACTIVE

## 2020-08-15 LAB — GLUCOSE, CAPILLARY
Glucose-Capillary: 58 mg/dL — ABNORMAL LOW (ref 70–99)
Glucose-Capillary: 65 mg/dL — ABNORMAL LOW (ref 70–99)
Glucose-Capillary: 134 mg/dL — ABNORMAL HIGH (ref 70–99)
Glucose-Capillary: 149 mg/dL — ABNORMAL HIGH (ref 70–99)

## 2020-08-15 LAB — CBG MONITORING, ED
Glucose-Capillary: 73 mg/dL (ref 70–99)
Glucose-Capillary: 40 mg/dL — CL (ref 70–99)
Glucose-Capillary: 93 mg/dL (ref 70–99)

## 2020-08-15 MED ORDER — SEMAGLUTIDE 14 MG PO TABS: 14.0000 mg | ORAL_TABLET | Freq: Every day | ORAL | Status: DC

## 2020-08-15 MED ORDER — PRAVASTATIN SODIUM 40 MG PO TABS
80.0000 mg | ORAL_TABLET | Freq: Every evening | ORAL | Status: DC
  Administered 2020-08-16 – 2020-08-18 (×3): 80 mg via ORAL
  Filled 2020-08-15 (×3): qty 2

## 2020-08-15 MED ORDER — SODIUM CHLORIDE 0.45 % IV SOLN: INTRAVENOUS | Status: DC

## 2020-08-15 MED ORDER — ACETAMINOPHEN 325 MG PO TABS: 650.0000 mg | ORAL_TABLET | Freq: Four times a day (QID) | ORAL | Status: DC | PRN

## 2020-08-15 MED ORDER — ACETAMINOPHEN 650 MG RE SUPP: 650.0000 mg | Freq: Four times a day (QID) | RECTAL | Status: DC | PRN

## 2020-08-15 MED ORDER — DEXTROSE 50 % IV SOLN: 25.0000 mL | Freq: Once | INTRAVENOUS | Status: AC

## 2020-08-15 MED ORDER — DEXTROSE-NACL 5-0.9 % IV SOLN: INTRAVENOUS | Status: AC

## 2020-08-15 MED ORDER — DEXTROSE 50 % IV SOLN: 1.0000 | Freq: Once | INTRAVENOUS | Status: AC

## 2020-08-15 MED ORDER — KETOROLAC TROMETHAMINE 30 MG/ML IJ SOLN
30.0000 mg | Freq: Four times a day (QID) | INTRAMUSCULAR | Status: DC | PRN
  Administered 2020-08-15: 30 mg via INTRAVENOUS
  Filled 2020-08-15: qty 1

## 2020-08-15 MED ORDER — DEXTROSE 50 % IV SOLN
INTRAVENOUS | Status: AC
  Administered 2020-08-15: 50 mL via INTRAVENOUS
  Filled 2020-08-15: qty 50

## 2020-08-15 MED ORDER — TORSEMIDE 100 MG PO TABS
100.0000 mg | ORAL_TABLET | Freq: Every day | ORAL | Status: DC
  Administered 2020-08-16 – 2020-08-17 (×2): 100 mg via ORAL
  Filled 2020-08-15 (×3): qty 1

## 2020-08-15 MED ORDER — IRBESARTAN 75 MG PO TABS
150.0000 mg | ORAL_TABLET | Freq: Every day | ORAL | Status: DC
  Administered 2020-08-16 – 2020-08-19 (×4): 150 mg via ORAL
  Filled 2020-08-15 (×4): qty 2

## 2020-08-15 MED ORDER — INSULIN ASPART 100 UNIT/ML IJ SOLN: 0.0000 [IU] | Freq: Three times a day (TID) | INTRAMUSCULAR | Status: DC

## 2020-08-15 MED ORDER — CHLORHEXIDINE GLUCONATE CLOTH 2 % EX PADS
6.0000 | MEDICATED_PAD | Freq: Every day | CUTANEOUS | Status: DC
  Administered 2020-08-16: 6 via TOPICAL

## 2020-08-15 MED ORDER — DEXTROSE 10 % IV SOLN: INTRAVENOUS | Status: DC

## 2020-08-15 MED ORDER — INSULIN DEGLUDEC 200 UNIT/ML ~~LOC~~ SOPN: 66.0000 [IU] | PEN_INJECTOR | Freq: Every day | SUBCUTANEOUS | Status: DC

## 2020-08-15 MED ORDER — LIDOCAINE-PRILOCAINE 2.5-2.5 % EX CREA: 1.0000 "application " | TOPICAL_CREAM | CUTANEOUS | Status: DC

## 2020-08-15 MED ORDER — FERRIC CITRATE 1 GM 210 MG(FE) PO TABS
420.0000 mg | ORAL_TABLET | Freq: Three times a day (TID) | ORAL | Status: DC
  Administered 2020-08-16 – 2020-08-19 (×10): 420 mg via ORAL
  Filled 2020-08-15 (×10): qty 2

## 2020-08-15 MED ORDER — TIZANIDINE HCL 2 MG PO TABS
4.0000 mg | ORAL_TABLET | Freq: Four times a day (QID) | ORAL | Status: DC | PRN
  Administered 2020-08-15: 4 mg via ORAL
  Filled 2020-08-15: qty 2
  Filled 2020-08-15: qty 1

## 2020-08-15 MED ORDER — DEXTROSE 50 % IV SOLN
INTRAVENOUS | Status: AC
  Administered 2020-08-15: 25 mL via INTRAVENOUS
  Filled 2020-08-15: qty 50

## 2020-08-15 MED ORDER — HEPARIN SODIUM (PORCINE) 5000 UNIT/ML IJ SOLN
5000.0000 [IU] | Freq: Three times a day (TID) | INTRAMUSCULAR | Status: DC
  Filled 2020-08-15 (×3): qty 1

## 2020-08-15 MED ORDER — CARVEDILOL 25 MG PO TABS
25.0000 mg | ORAL_TABLET | Freq: Two times a day (BID) | ORAL | Status: DC
  Administered 2020-08-16 – 2020-08-19 (×6): 25 mg via ORAL
  Filled 2020-08-15 (×6): qty 1

## 2020-08-15 MED ORDER — HEPARIN SODIUM (PORCINE) 1000 UNIT/ML DIALYSIS: 20.0000 [IU]/kg | INTRAMUSCULAR | Status: DC | PRN

## 2020-08-15 NOTE — Progress Notes (Signed)
Pt complain of not feeling well. BS was checked and low 65 and juice was given. Rechecked BS 58. Dr Johnney Ou informed and D 50% 64ml given. Latest BS 175

## 2020-08-15 NOTE — ED Provider Notes (Signed)
[facility] EMERGENCY DEPARTMENT Provider Note   CSN: [id number] Arrival date & time: 08/15/20  1020     History Chief Complaint  Patient presents with  . Missed Dialysis    Last dialysis Monday 4/25    Scott Castillo is a 60 y.o. male.  60 year old male with prior medical history as detailed below presents for evaluation.  Patient reports that his last dialysis session was on Monday.  Patient reports that he has missed his Wednesday session.  He also missed today.  Patient arrives by EMS for evaluation given his missed dialysis sessions.  EMS reports the patient was initially found to have mildly slurred speech.  Accu-Chek on scene revealed a glucose of 58.  This was treated with D10.  Patient's speech improved.  He otherwise was without significant acute complaint during EMS transport.  Patient reports that he was unable to get to dialysis for the last several days secondary to "not feeling well".  Patient reports that he has a nerve in his back that gets pinched.  This interferes with his ability to get around.  Patient is currently being worked up by neurology for possible Dialysis Disequilibrium Syndrome.  Patient reports that he does still make urine.  He denies fever or shortness of breath.  He denies chest pain.  He was able to ambulate from the EMS stretcher to ED stretcher without difficulty.  The history is provided by the patient and medical records. No language interpreter was used.  Illness Location:  Missed dialysis, hypoglycemia Severity:  Moderate Onset quality:  Gradual Duration:  3 days Timing:  Constant Progression:  Unchanged Chronicity:  New Associated symptoms: no fever        Past Medical History:  Diagnosis Date  . Venous stasis dermatitis of both lower extremities     Patient Active Problem List   Diagnosis Date Noted  . Acute metabolic encephalopathy 40/98/1191  . Aphasia 06/16/2020  . ESRD (end stage renal disease) ([place])  09/04/2019  . Acute renal failure superimposed on chronic kidney disease ([place]) 07/03/2016  . Diabetes mellitus type 2 in obese ([place]) 07/03/2016  . Hyperkalemia 07/03/2016  . Hypertensive urgency 07/03/2016  . Hyperlipidemia 07/03/2016  . Anemia of chronic disease 07/03/2016  . ARF (acute renal failure) ([place]) 07/03/2016    Past Surgical History:  Procedure Laterality Date  . A/V FISTULAGRAM Left 09/27/2019   Procedure: A/V FISTULAGRAM;  Surgeon: Marty Heck, MD;  Location: [facility];  Service: Cardiovascular;  Laterality: Left;  . AV FISTULA PLACEMENT Left 09/17/2016   Procedure: ARTERIOVENOUS (AV) FISTULA CREATION;  Surgeon: [name], MD;  Location: [facility] OR;  Service: Vascular;  Laterality: Left;  . ENDOVENOUS ABLATION SAPHENOUS VEIN W/ LASER Right 04/08/2016   EVLA R greater saphenous vein by Curt Jews MD  . ENDOVENOUS ABLATION SAPHENOUS VEIN W/ LASER Left 05/13/2016   endovenous laser ablation (left greater saphenous vein) by Curt Jews MD        Family History  Problem Relation Age of Onset  . Heart attack Father     Social History   Tobacco Use  . Smoking status: Never Smoker  . Smokeless tobacco: Never Used  Substance Use Topics  . Alcohol use: No  . Drug use: No    Home Medications Prior to Admission medications   Medication Sig Start Date End Date Taking? Authorizing Provider  carvedilol (COREG) 25 MG tablet Take 25 mg by mouth 2 (two) times daily with a meal.  07/28/15   [provider]  ferric citrate (AURYXIA) 1 GM 210 MG(Fe) tablet Take 210-420 mg by mouth See admin instructions. Take 2 tablets (420 mg) by mouth 3 times daily with meals and 1 tablet (210 mg) with snacks    [provider]  Insulin Degludec (TRESIBA FLEXTOUCH) 200 UNIT/ML SOPN Inject 10 Units into the skin every morning. Patient taking differently: Inject 66 Units into the skin daily. 07/06/16   Reyne Dumas, MD  Insulin Pen Needle (BD ULTRA-FINE PEN NEEDLES)  29G X 12.7MM MISC USE AS DIRECTED EVERY DAY 09/22/19   [provider]  irbesartan (AVAPRO) 150 MG tablet Take 150 mg by mouth daily. 09/05/19   [provider]  lidocaine-prilocaine (EMLA) cream Apply 1 application topically See admin instructions. Apply topically 2 hours before dialysis on Monday, Wednesday, Friday    [provider]  neomycin-bacitracin-polymyxin (NEOSPORIN) ointment Apply 1 application topically daily as needed for wound care. apply to eye    [provider]  pravastatin (PRAVACHOL) 80 MG tablet Take 80 mg by mouth every evening.     [provider]  Semaglutide (RYBELSUS) 14 MG TABS Take 14 mg by mouth daily. 10/05/18   [provider]  tiZANidine (ZANAFLEX) 4 MG tablet Take 4 mg by mouth every 6 (six) hours as needed for muscle spasms. 06/13/20   [provider]  torsemide (DEMADEX) 100 MG tablet Take 100 mg by mouth daily. 02/20/20   [provider]    Allergies    Pioglitazone, Adhesive [tape], and Latex  Review of Systems   Review of Systems  Constitutional: Negative for fever.  All other systems reviewed and are negative.   Physical Exam Updated Vital Signs BP (!) 203/95 (BP Location: Right Arm)   Pulse 69   Temp (!) 97.4 F (36.3 C) (Oral)   Resp 15   Ht 6' (1.829 m)   Wt 114.3 kg   SpO2 99%   BMI 34.18 kg/m   Physical Exam Vitals and nursing note reviewed.  Constitutional:      General: He is not in acute distress.    Appearance: Normal appearance. He is well-developed.  HENT:     Head: Normocephalic and atraumatic.  Eyes:     Conjunctiva/sclera: Conjunctivae normal.     Pupils: Pupils are equal, round, and reactive to light.  Cardiovascular:     Rate and Rhythm: Normal rate and regular rhythm.     Heart sounds: Normal heart sounds.  Pulmonary:     Effort: Pulmonary effort is normal. No respiratory distress.     Breath sounds: Normal breath sounds.  Abdominal:     General:  There is no distension.     Palpations: Abdomen is soft.     Tenderness: There is no abdominal tenderness.  Musculoskeletal:        General: No deformity. Normal range of motion.     Cervical back: Normal range of motion and neck supple.     Comments: Fistula to patient's left forearm is without erythema. Thrill present.   Skin:    General: Skin is warm and dry.  Neurological:     General: No focal deficit present.     Mental Status: He is alert and oriented to person, place, and time. Mental status is at baseline.     ED Results / Procedures / Treatments   Labs (all labs ordered are listed, but only abnormal results are displayed) Labs Reviewed  CBC WITH DIFFERENTIAL/PLATELET - Abnormal; Notable  for the following components:      Result Value   WBC 11.4 (*)    RBC 3.65 (*)    Hemoglobin 11.3 (*)    HCT 34.0 (*)    Neutro Abs 9.5 (*)    All other components within normal limits  BASIC METABOLIC PANEL - Abnormal; Notable for the following components:   Sodium 134 (*)    Potassium 6.2 (*)    CO2 17 (*)    BUN 115 (*)    Creatinine, Ser 16.60 (*)    Calcium 8.4 (*)    GFR, Estimated 3 (*)    Anion gap 18 (*)    All other components within normal limits  RESP PANEL BY RT-PCR (FLU A&B, COVID) [id number]  CBG MONITORING, ED    EKG EKG Interpretation  Date/Time:  Friday August 15 2020 10:27:53 EDT Ventricular Rate:  72 PR Interval:  190 QRS Duration: 84 QT Interval:  386 QTC Calculation: 423 R Axis:   -11 Text Interpretation: Sinus rhythm Abnormal R-wave progression, early transition Minimal ST elevation, anterior leads Confirmed by Dene Gentry [phone]) on 08/15/2020 10:44:16 AM   Radiology DG Chest Port 1 View  Result Date: 08/15/2020 CLINICAL DATA:  Short of breath.  Dialysis patient. EXAM: PORTABLE CHEST 1 VIEW COMPARISON:  05/02/2020 FINDINGS: Heart size upper normal. Mild vascular congestion without edema or effusion. Negative for infiltrate. IMPRESSION: Mild  vascular congestion without edema Electronically Signed   By: Franchot Gallo M.D.   On: 08/15/2020 11:02    Procedures Procedures   Medications Ordered in ED Medications - No data to display  ED Course  I have reviewed the triage vital signs and the nursing notes.  Pertinent labs & imaging results that were available during my care of the patient were reviewed by me and considered in my medical decision making (see chart for details).    MDM Rules/Calculators/A&P                          MDM  MSE complete  Williard E Mabee was evaluated in Emergency Department on 08/15/2020 for the symptoms described in the history of present illness. He was evaluated in the context of the global COVID-19 pandemic, which necessitated consideration that the patient might be at risk for infection with the SARS-CoV-2 virus that causes COVID-19. Institutional protocols and algorithms that pertain to the evaluation of patients at risk for COVID-19 are in a state of rapid change based on information released by regulatory bodies including the CDC and federal and state organizations. These policies and algorithms were followed during the patient's care in the ED.  Patient is presenting with complaint of missed dialysis sessions earlier this week and today.  Patient is known to Dr. Johnney Ou with nephrology.  Patient's case discussed with Dr. Johnney Ou.  She request admission.  She will arrange inpatient dialysis.    Hospitalist services aware of case and will evaluate for admission.  Final Clinical Impression(s) / ED Diagnoses Final diagnoses:  ESRD (end stage renal disease) ([place])    Rx / DC Orders ED Discharge Orders    None       Valarie Merino, MD 08/15/20 1229

## 2020-08-15 NOTE — ED Notes (Addendum)
Triage documentation and assessment completed by America Brown., RN.

## 2020-08-15 NOTE — Progress Notes (Signed)
Inpatient Diabetes Program Recommendations  AACE/ADA: New Consensus Statement on Inpatient Glycemic Control (2015)  Target Ranges:  Prepandial:   less than 140 mg/dL      Peak postprandial:   less than 180 mg/dL (1-2 hours)      Critically ill patients:  140 - 180 mg/dL   Lab Results  Component Value Date   GLUCAP 93 08/15/2020   HGBA1C 8.0 (H) 06/17/2020    Review of Glycemic Control Results for Scott Castillo, Scott Castillo (MRN [id number]) as of 08/15/2020 13:46  Ref. Range 08/15/2020 10:29 08/15/2020 12:29 08/15/2020 13:00  Glucose-Capillary Latest Ref Range: 70 - 99 mg/dL 73 40 (LL) 93   Diabetes history: DM Outpatient Diabetes medications:  Current orders for Inpatient glycemic control: Tresiba 66 units qd + Novolog 0-20 units tid  + Ryelasus (glp-1)  Inpatient Diabetes Program Recommendations:   D/C Tresiba D/C GLP1 while in the hospital Decrease Novolog correction to 0-9 units tid if eating and q 4 hrs. if NPO Restart lower dose of basal insulin as CBGs increase Secure chat sent to Dr. Crissie Sickles.  Thank you, [name]. Demarus Latterell, RN, MSN, CDE  Diabetes Coordinator Inpatient Glycemic Control Team Team Pager [phone] (8am-5pm) 08/15/2020 2:08 PM

## 2020-08-15 NOTE — H&P (Addendum)
History and Physical    Scott Castillo ZHG:[id number] DOB: [birth year] DOA: 08/15/2020  PCP: Algis Greenhouse, MD (Confirm with patient/family/NH records and if not entered, this has to be entered at [facility] point of entry) Patient coming from: home  I have personally briefly reviewed patient's old medical records in [place]  Chief Complaint: missed HD  HPI: Scott Castillo is a 60 y.o. male with medical history significant of ESRD on HD MWF, DM2, polyneuropathy, HTN.  Patient arrives by EMS for evaluation given his missed dialysis sessions.  last dialysis session was on Monday.  Patient reports that he has missed his Wednesday session.  He also missed today.  EMS reports the patient was initially found to have mildly slurred speech.  Accu-Chek on scene revealed a glucose of 58.  This was treated with D10.  Patient's speech improved.  He otherwise was without significant acute complaint during EMS transport.  Patient reports that he was unable to get to dialysis for the last several days secondary to "not feeling well".  Patient reports that he has a nerve in his back that gets pinched.  This interferes with his ability to get around.  Patient is currently being worked up by neurology for possible Dialysis Disequilibrium Syndrome and two episodes of aphasia associated with HD.   ED Course: T 97.4  172/75  HR 64  RR 13. ED-P exam unremarkable. Lab reveals K 6.2, BUN 16.6 Cr 8.4, WBC 11.4 w/ 84/6/8. CXR with vascular congestion w/o edema. Dr. Johnney Ou consulted who has written HD orders and requested medical admission to ensure patient is medically stable. [facility] called to admit patient.   Review of Systems: As per HPI otherwise 10 point review of systems negative. Specifically denies any fevers, chills, cough production of sputum, dysuria, active wounds or other symptoms or signs of infection.  Past Medical History:  Diagnosis Date  . C7 radiculopathy   . Carpal tunnel syndrome, bilateral    by  EMG  R>L  . 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 H/O pancreatic cancer   . HLD (hyperlipidemia)   . HTN (hypertension)   . Lumbar radiculopathy, chronic    hemilamectomy '94  . Venous stasis dermatitis of both lower extremities     Past Surgical History:  Procedure Laterality Date  . A/V FISTULAGRAM Left 09/27/2019   Procedure: A/V FISTULAGRAM;  Surgeon: Marty Heck, MD;  Location: [facility] LAB;  Service: Cardiovascular;  Laterality: Left;  . AV FISTULA PLACEMENT Left 09/17/2016   Procedure: ARTERIOVENOUS (AV) FISTULA CREATION;  Surgeon: [name], MD;  Location: [facility];  Service: Vascular;  Laterality: Left;  . CATARACT EXTRACTION W/ INTRAOCULAR LENS IMPLANT Left 2015  . ENDOVENOUS ABLATION SAPHENOUS VEIN W/ LASER Right 04/08/2016   EVLA R greater saphenous vein by Curt Jews MD  . ENDOVENOUS ABLATION SAPHENOUS VEIN W/ LASER Left 05/13/2016   endovenous laser ablation (left greater saphenous vein) by Curt Jews MD   . FOOT SURGERY    . HEMILAMINOTOMY LUMBAR SPINE  '94  . TONSILLECTOMY     Soc Hx - married 32 years. Wife is a survivor of ovarian cancer at 48 y/o, breast cancer x 2. Currently cancer free. One daughter 75 y/o. Lives with his wife. I-ADLs. Has 4 cats, 2 dogs and a rabbit. Works part-time as Chiropractor.    reports that he has never smoked. He has never used smokeless  tobacco. He reports that he does not drink alcohol and does not use drugs.  Allergies  Allergen Reactions  . Heparin Other (See Comments)    Per pt he has episodes where he can't speak  . Pioglitazone Swelling    Site of swelling not recalled by patient  . Adhesive [Tape] Other (See Comments)    Causes redness, pt prefers paper tape   . Latex Rash    Redness, also    Family History  Problem Relation Age of Onset  . Edema Mother   . Heart attack Father   . Heart failure Father   . Heart attack Brother   .  Heart attack Brother      Prior to Admission medications   Medication Sig Start Date End Date Taking? Authorizing Provider  carvedilol (COREG) 25 MG tablet Take 25 mg by mouth 2 (two) times daily with a meal.  07/28/15   [provider]  ferric citrate (AURYXIA) 1 GM 210 MG(Fe) tablet Take 210-420 mg by mouth See admin instructions. Take 2 tablets (420 mg) by mouth 3 times daily with meals and 1 tablet (210 mg) with snacks    [provider]  Insulin Degludec (TRESIBA FLEXTOUCH) 200 UNIT/ML SOPN Inject 10 Units into the skin every morning. Patient taking differently: Inject 66 Units into the skin daily. 07/06/16   Reyne Dumas, MD  Insulin Pen Needle (BD ULTRA-FINE PEN NEEDLES) 29G X 12.7MM MISC USE AS DIRECTED EVERY DAY 09/22/19   [provider]  irbesartan (AVAPRO) 150 MG tablet Take 150 mg by mouth daily. 09/05/19   [provider]  lidocaine-prilocaine (EMLA) cream Apply 1 application topically See admin instructions. Apply topically 2 hours before dialysis on Monday, Wednesday, Friday    [provider]  neomycin-bacitracin-polymyxin (NEOSPORIN) ointment Apply 1 application topically daily as needed for wound care. apply to eye    [provider]  pravastatin (PRAVACHOL) 80 MG tablet Take 80 mg by mouth every evening.     [provider]  Semaglutide (RYBELSUS) 14 MG TABS Take 14 mg by mouth daily. 10/05/18   [provider]  tiZANidine (ZANAFLEX) 4 MG tablet Take 4 mg by mouth every 6 (six) hours as needed for muscle spasms. 06/13/20   [provider]  torsemide (DEMADEX) 100 MG tablet Take 100 mg by mouth daily. 02/20/20   [provider]    Physical Exam: Vitals:   08/15/20 1029 08/15/20 1031 08/15/20 1145  BP: (!) 203/95  (!) 172/75  Pulse: 69  64  Resp: 15  13  Temp: (!) 97.4 F (36.3 C)    TempSrc: Oral    SpO2: 99%  99%  Weight:  114.3 kg   Height:  6' (1.829 m)      Vitals:   08/15/20  1029 08/15/20 1031 08/15/20 1145  BP: (!) 203/95  (!) 172/75  Pulse: 69  64  Resp: 15  13  Temp: (!) 97.4 F (36.3 C)    TempSrc: Oral    SpO2: 99%  99%  Weight:  114.3 kg   Height:  6' (1.829 m)    General:  Heavy set man in no distress Eyes: PERRL, lids and conjunctivae normal ENMT: Mucous membranes are moist.Normal dentition.  Neck: normal, supple, no masses, no thyromegaly Respiratory: clear to auscultation bilaterally, no wheezing, no crackles. Normal respiratory effort. No accessory muscle use.  Cardiovascular: Regular rate and rhythm, no murmurs / rubs / gallops. No extremity edema. trace pedal pulses-distal LE  cool to touch. No carotid bruits.  Abdomen: no tenderness, no masses palpated. No hepatosplenomegaly. Bowel sounds positive.  Musculoskeletal: no clubbing / cyanosis. No joint deformity upper and lower extremities. Good ROM, no contractures. Normal muscle tone.  Skin: HD AV fistula left forearm. Venous stasis skin changes bilateral distal LE, no excoriations noted. Plantar aspect feet clear.  Neurologic: CN 2-12 grossly intact. Sensation diminished to light touch. Strength 5/5 in all 4.  Psychiatric: Normal judgment and insight. Alert and oriented x 3. Normal mood.     Labs on Admission: I have personally reviewed following labs and imaging studies  CBC: Recent Labs  Lab 08/15/20 1034  WBC 11.4*  NEUTROABS 9.5*  HGB 11.3*  HCT 34.0*  MCV 93.2  PLT 160   Basic Metabolic Panel: Recent Labs  Lab 08/15/20 1034  NA 134*  K 6.2*  CL 99  CO2 17*  GLUCOSE 71  BUN 115*  CREATININE 16.60*  CALCIUM 8.4*   GFR: Estimated Creatinine Clearance: 6.3 mL/min (A) (by C-G formula based on SCr of 16.6 mg/dL (H)). Liver Function Tests: No results for input(s): AST, ALT, ALKPHOS, BILITOT, PROT, ALBUMIN in the last 168 hours. No results for input(s): LIPASE, AMYLASE in the last 168 hours. No results for input(s): AMMONIA in the last 168 hours. Coagulation Profile: No  results for input(s): INR, PROTIME in the last 168 hours. Cardiac Enzymes: No results for input(s): CKTOTAL, CKMB, CKMBINDEX, TROPONINI in the last 168 hours. BNP (last 3 results) No results for input(s): PROBNP in the last 8760 hours. HbA1C: No results for input(s): HGBA1C in the last 72 hours. CBG: Recent Labs  Lab 08/15/20 1029 08/15/20 1229 08/15/20 1300  GLUCAP 73 40* 93   Lipid Profile: No results for input(s): CHOL, HDL, LDLCALC, TRIG, CHOLHDL, LDLDIRECT in the last 72 hours. Thyroid Function Tests: No results for input(s): TSH, T4TOTAL, FREET4, T3FREE, THYROIDAB in the last 72 hours. Anemia Panel: No results for input(s): VITAMINB12, FOLATE, FERRITIN, TIBC, IRON, RETICCTPCT in the last 72 hours. Urine analysis:    Component Value Date/Time   COLORURINE YELLOW 06/16/2020 [place] 06/16/2020 1244   LABSPEC 1.013 06/16/2020 1244   PHURINE 8.0 06/16/2020 1244   GLUCOSEU 150 (A) 06/16/2020 1244   HGBUR SMALL (A) 06/16/2020 [place] 06/16/2020 [place] 06/16/2020 1244   PROTEINUR >=300 (A) 06/16/2020 1244   NITRITE NEGATIVE 06/16/2020 1244   LEUKOCYTESUR TRACE (A) 06/16/2020 1244    Radiological Exams on Admission: DG Chest Port 1 View  Result Date: 08/15/2020 CLINICAL DATA:  Short of breath.  Dialysis patient. EXAM: PORTABLE CHEST 1 VIEW COMPARISON:  05/02/2020 FINDINGS: Heart size upper normal. Mild vascular congestion without edema or effusion. Negative for infiltrate. IMPRESSION: Mild vascular congestion without edema Electronically Signed   By: Franchot Gallo M.D.   On: 08/15/2020 11:02    EKG: Independently reviewed. SR, peaked T wave in V 2,3,4,5.  Assessment/Plan Active Problems:   Acute renal failure superimposed on chronic kidney disease ([place])   Diabetes mellitus type 2 in obese (HCC)   Hyperkalemia   Aphasia   Hyperkalemia, diminished renal excretion    1. Hyperkalemia - dud to missed HD. Peaked T  waves otherwise symptom free. Plan HD for today  2. Acute renal failure - patient diabetic related ESRD on HD. Missed HD Wed and Fri with subsequent acute renal failure. Plan HD for today  3. DM 2- patient reports last A1C at HD center was  6.8%, last in Epic 06/17/20 8%. Patient has been hypoglycemic today: 50's when EMS arrived, 40"s in ED. Plan D5NS x 6 hours and frequent CBG with D50 prn.  Hold Tresiba and Semiglutamide  Sliding scale coverage.  4. Aphasia - patient has had several episodes of aphasia associated with HD. Saw neurology at [facility] 08/14/20. He has had negative MRI 06/17/20, CTA neck/head negative for occlusinve disease, EEG at [facility] that revealed slowing but no seizure activity. At exam patient has normal exam Plan Observation. For acute aphasia or other neuro changes may need neuro consult.   DVT prophylaxis: heparin SQ  Code Status: full code  Family Communication: per patient his wife is at an oncology f/u appt.  Disposition Plan: home 24-48 hours  Consults called: Renal - Dr. Johnney Ou Admission status: obs    Adella Hare MD [facility] Pager [phone]  If 7PM-7AM, please contact night-coverage [email or web address] Password [email or web address]  08/15/2020, 1:25 PM

## 2020-08-15 NOTE — ED Triage Notes (Signed)
Pt arrived via [facility]. EMS reports that requested transport to ED d/t missing dialysis. Pt last had dialysis Monday, missed Wednesday, and is also due to dialysis today. EMS further reports pt was found to be hypoglycemic at 58 initially and came up to 123 with 2g D10. Pt caox4 with no obvious neuro deficit. Hx HTN, did not take PO BP meds this morning.

## 2020-08-15 NOTE — Progress Notes (Addendum)
Patient arrived to floor from ED and Dialysis transport arrived at the same time.

## 2020-08-15 NOTE — Progress Notes (Signed)
Wife called and updated. She states that he has been having these confusion episodes more frequently and has been missing dialysis treatments due to pinched nerve in his neck. She also confirmed that the aphasia occurs with the confusion episodes.

## 2020-08-15 NOTE — Consult Note (Signed)
[facility] Renal Consultation Note    Indication for Consultation:  Management of ESRD/hemodialysis, anemia, hypertension/volume, and secondary hyperparathyroidism. PCP:  HPI: Scott Castillo Strength is a 60 y.o. male who presented to the ED after missing dialysis. Patient missed HD on 4/27 and 4/22, short treatment on 4/25 (completed 2.5 hours). He called his dialysis unit and reported he was feeling "under the weather" and was instructed to come to the ED. When EMS arrived, BS was in the 50's. Labs in the ED notable for K+ 6.2, Na 134, BUN 115, Cr 16.6, Ca 8.4, WBC 11.4, Hgb 11.3, Plt 226. Hypertensive, VS otherwise stable. He went to the bathroom in the ED and became diaphoretic. Blood sugar was 40, given glucose and ED provider starting D10. Patient reports he took 66 units of insulin this AM which is his normal dose, but notes he has had more episodes of hypoglycemia at home lately. Also reports chronic intermittent diarrhea, which is worse today. Has had 3 loose BMs. Denies fever, chills, nausea, vomiting, abdominal pain, shortness of breath, orthopnea, CP, palpitations, and dizziness.  Pt has been missing dialysis due to recurrent episodes of aphasia. He reports he has a pinched nerve in his neck and will feel a sharp pulling sensation then be unable to speak. Reports this happened at HD on Monday but also happened at home on Wednesday. He felt symptoms were better after heparin was stopped at dialysis, and also reports that putting an ice pack on his neck alleviated symptoms on Monday. He has been followed by neurology who note "chronic left C7 radiculopathy and possibly C8 radiculopathy. In addition there may be nonlocalizing bilateral ulnar neuropathies. There was severe bilateral carpal tunnel syndrome, worse on the right," but no clear cause of aphasic episodes.    Past Medical History:  Diagnosis Date  . Venous stasis dermatitis of both lower extremities    Past Surgical History:   Procedure Laterality Date  . A/V FISTULAGRAM Left 09/27/2019   Procedure: A/V FISTULAGRAM;  Surgeon: Marty Heck, MD;  Location: [facility];  Service: Cardiovascular;  Laterality: Left;  . AV FISTULA PLACEMENT Left 09/17/2016   Procedure: ARTERIOVENOUS (AV) FISTULA CREATION;  Surgeon: [name], MD;  Location: [facility];  Service: Vascular;  Laterality: Left;  . ENDOVENOUS ABLATION SAPHENOUS VEIN W/ LASER Right 04/08/2016   EVLA R greater saphenous vein by Curt Jews MD  . ENDOVENOUS ABLATION SAPHENOUS VEIN W/ LASER Left 05/13/2016   endovenous laser ablation (left greater saphenous vein) by Curt Jews MD    Family History  Problem Relation Age of Onset  . Heart attack Father    Social History:  reports that he has never smoked. He has never used smokeless tobacco. He reports that he does not drink alcohol and does not use drugs.  ROS: As per HPI otherwise negative.  Physical Exam: Vitals:   08/15/20 1029 08/15/20 1031 08/15/20 1145  BP: (!) 203/95  (!) 172/75  Pulse: 69  64  Resp: 15  13  Temp: (!) 97.4 F (36.3 C)    TempSrc: Oral    SpO2: 99%  99%  Weight:  114.3 kg   Height:  6' (1.829 m)      General: Well developed, well nourished, in no acute distress. Head: Normocephalic, atraumatic, sclera non-icteric, mucus membranes are moist. Neck: JVD not elevated. Lungs: Clear bilaterally to auscultation without wheezes, rales, or rhonchi. Breathing is unlabored on RA Heart: RRR with normal S1, S2. No murmurs,  rubs, or gallops appreciated. Abdomen: Soft, non-tender, non-distended with normoactive bowel sounds. No rebound/guarding. No obvious abdominal masses. Musculoskeletal:  Strength and tone appear normal for age. Lower extremities: Chronic appearing brawny discoloration of bilateral lower legs, no pitting edema Neuro: Alert and oriented X 3. Moves all extremities spontaneously. Psych:  Responds to questions appropriately with a normal affect. Dialysis  Access: LUE AVF + bruit  Allergies  Allergen Reactions  . Pioglitazone Swelling    Site of swelling not recalled by patient  . Adhesive [Tape] Other (See Comments)    Causes redness, pt prefers paper tape   . Latex Rash    Redness, also   Prior to Admission medications   Medication Sig Start Date End Date Taking? Authorizing Provider  carvedilol (COREG) 25 MG tablet Take 25 mg by mouth 2 (two) times daily with a meal.  07/28/15   [provider]  ferric citrate (AURYXIA) 1 GM 210 MG(Fe) tablet Take 210-420 mg by mouth See admin instructions. Take 2 tablets (420 mg) by mouth 3 times daily with meals and 1 tablet (210 mg) with snacks    [provider]  Insulin Degludec (TRESIBA FLEXTOUCH) 200 UNIT/ML SOPN Inject 10 Units into the skin every morning. Patient taking differently: Inject 66 Units into the skin daily. 07/06/16   Reyne Dumas, MD  Insulin Pen Needle (BD ULTRA-FINE PEN NEEDLES) 29G X 12.7MM MISC USE AS DIRECTED EVERY DAY 09/22/19   [provider]  irbesartan (AVAPRO) 150 MG tablet Take 150 mg by mouth daily. 09/05/19   [provider]  lidocaine-prilocaine (EMLA) cream Apply 1 application topically See admin instructions. Apply topically 2 hours before dialysis on Monday, Wednesday, Friday    [provider]  neomycin-bacitracin-polymyxin (NEOSPORIN) ointment Apply 1 application topically daily as needed for wound care. apply to eye    [provider]  pravastatin (PRAVACHOL) 80 MG tablet Take 80 mg by mouth every evening.     [provider]  Semaglutide (RYBELSUS) 14 MG TABS Take 14 mg by mouth daily. 10/05/18   [provider]  tiZANidine (ZANAFLEX) 4 MG tablet Take 4 mg by mouth every 6 (six) hours as needed for muscle spasms. 06/13/20   [provider]  torsemide (DEMADEX) 100 MG tablet Take 100 mg by mouth daily. 02/20/20   [provider]   Current Facility-Administered Medications   Medication Dose Route Frequency Provider Last Rate Last Admin  . [START ON 08/16/2020] Chlorhexidine Gluconate Cloth 2 % PADS 6 each  6 each Topical Q0600 Lyberti Thrush G, PA-C      . dextrose 10 % infusion   Intravenous Continuous Valarie Merino, MD      . dextrose 50 % solution 50 mL  1 ampule Intravenous Once Valarie Merino, MD      . dextrose 50 % solution            Current Outpatient Medications  Medication Sig Dispense Refill  . carvedilol (COREG) 25 MG tablet Take 25 mg by mouth 2 (two) times daily with a meal.     . ferric citrate (AURYXIA) 1 GM 210 MG(Fe) tablet Take 210-420 mg by mouth See admin instructions. Take 2 tablets (420 mg) by mouth 3 times daily with meals and 1 tablet (210 mg) with snacks    . Insulin Degludec (TRESIBA FLEXTOUCH) 200 UNIT/ML SOPN Inject 10 Units into the skin every morning. (Patient taking differently: Inject 66 Units into the skin daily.) 1 pen 11  .  Insulin Pen Needle (BD ULTRA-FINE PEN NEEDLES) 29G X 12.7MM MISC USE AS DIRECTED EVERY DAY    . irbesartan (AVAPRO) 150 MG tablet Take 150 mg by mouth daily.    Marland Kitchen lidocaine-prilocaine (EMLA) cream Apply 1 application topically See admin instructions. Apply topically 2 hours before dialysis on Monday, Wednesday, Friday    . neomycin-bacitracin-polymyxin (NEOSPORIN) ointment Apply 1 application topically daily as needed for wound care. apply to eye    . pravastatin (PRAVACHOL) 80 MG tablet Take 80 mg by mouth every evening.     . Semaglutide (RYBELSUS) 14 MG TABS Take 14 mg by mouth daily.    Marland Kitchen tiZANidine (ZANAFLEX) 4 MG tablet Take 4 mg by mouth every 6 (six) hours as needed for muscle spasms.    Marland Kitchen torsemide (DEMADEX) 100 MG tablet Take 100 mg by mouth daily.     Labs: Basic Metabolic Panel: Recent Labs  Lab 08/15/20 1034  NA 134*  K 6.2*  CL 99  CO2 17*  GLUCOSE 71  BUN 115*  CREATININE 16.60*  CALCIUM 8.4*   CBC: Recent Labs  Lab 08/15/20 1034  WBC 11.4*  NEUTROABS 9.5*  HGB 11.3*   HCT 34.0*  MCV 93.2  PLT 226   CBG: Recent Labs  Lab 08/15/20 1029 08/15/20 1229  GLUCAP 73 40*   Studies/Results: DG Chest Port 1 View  Result Date: 08/15/2020 CLINICAL DATA:  Short of breath.  Dialysis patient. EXAM: PORTABLE CHEST 1 VIEW COMPARISON:  05/02/2020 FINDINGS: Heart size upper normal. Mild vascular congestion without edema or effusion. Negative for infiltrate. IMPRESSION: Mild vascular congestion without edema Electronically Signed   By: Franchot Gallo M.D.   On: 08/15/2020 11:02    Dialysis Orders:  Center: [facility] on MWF. Optiflux 200NRe, 4:15hr, BFR 400, DFR 500, EDW 111.5kg, 2K/2Ca, UF profile 2, AVF 15g, no heparin   Assessment/Plan: 1. Missed dialysis/ ESRD:  Dialyzes on MWF schedule. Pt has only had 2.5 hours of dialysis in the past week due to issues with pinched nerve in his neck. K+, BUN and Cr are elevated. Will plan for HD today, reeval in the AM.  2. Episodic aphasia/pinched cervical nerve: Pt has episodes of aphasia for several months which he associates with pain in his neck. Has been evaluated outpatient, no clear metabolic cause found for these episodes and they are now happening on and off HD. Seen by neuro yesterday, see note from [facility]. Pt feels heparin exacerbates the episodes on aphasia on HD, so will hold heparin.  3.  Hypertension/volume: Hypertensive, CXR with mild vascular congestion but no shortness of breath. UF to EDW today as tolerated.  4. Hypoglycemia: BS in the 50's with EMS and 40's in the ED. Agree with starting D10. Pt took 66 units of insulin this AM which he reports is his normal dose, but does have increasingly frequent episodes of hypoglycemia at home. May need insulin dose lowered, mgt per admitting team.  5.  Anemia: Hgb 11.3. No ESA indicated at this time 6.  Metabolic bone disease: Calcium controlled. Not on VDRA. Resume binders once tolerating PO.  Anice Paganini, PA-C 08/15/2020, 12:37 PM  [facility] Pager: [phone]

## 2020-08-15 NOTE — Progress Notes (Signed)
Back to room 5MW-08 from dialysis. BG 134. Confused to place and date. Alert to person only. Anxious and keeps sitting up on the side of the bed and rocking back and forth. Denies pain at this time. Assessment completed. Continuing to monitor.

## 2020-08-16 ENCOUNTER — Observation Stay (HOSPITAL_COMMUNITY)
Admit: 2020-08-16 | Discharge: 2020-08-16 | Disposition: A | Discharge status: Home / Self Care | Payer: Medicare Other | Attending: Neurology | Admitting: Neurology

## 2020-08-16 DIAGNOSIS — E8770 Fluid overload, unspecified: Secondary | ICD-10-CM | POA: Diagnosis present

## 2020-08-16 DIAGNOSIS — M898X9 Other specified disorders of bone, unspecified site: Secondary | ICD-10-CM | POA: Diagnosis present

## 2020-08-16 DIAGNOSIS — R4701 Aphasia: Secondary | ICD-10-CM | POA: Diagnosis present

## 2020-08-16 DIAGNOSIS — E875 Hyperkalemia: Secondary | ICD-10-CM | POA: Diagnosis present

## 2020-08-16 DIAGNOSIS — N179 Acute kidney failure, unspecified: Secondary | ICD-10-CM | POA: Diagnosis present

## 2020-08-16 DIAGNOSIS — E162 Hypoglycemia, unspecified: Secondary | ICD-10-CM

## 2020-08-16 DIAGNOSIS — Z9115 Patient's noncompliance with renal dialysis: Secondary | ICD-10-CM | POA: Diagnosis not present

## 2020-08-16 DIAGNOSIS — Z6834 Body mass index (BMI) 34.0-34.9, adult: Secondary | ICD-10-CM | POA: Diagnosis not present

## 2020-08-16 DIAGNOSIS — M5412 Radiculopathy, cervical region: Secondary | ICD-10-CM | POA: Diagnosis present

## 2020-08-16 DIAGNOSIS — N186 End stage renal disease: Secondary | ICD-10-CM | POA: Diagnosis present

## 2020-08-16 DIAGNOSIS — N2581 Secondary hyperparathyroidism of renal origin: Secondary | ICD-10-CM | POA: Diagnosis present

## 2020-08-16 DIAGNOSIS — E1122 Type 2 diabetes mellitus with diabetic chronic kidney disease: Secondary | ICD-10-CM | POA: Diagnosis present

## 2020-08-16 DIAGNOSIS — I120 Hypertensive chronic kidney disease with stage 5 chronic kidney disease or end stage renal disease: Secondary | ICD-10-CM | POA: Diagnosis present

## 2020-08-16 DIAGNOSIS — E1142 Type 2 diabetes mellitus with diabetic polyneuropathy: Secondary | ICD-10-CM | POA: Diagnosis present

## 2020-08-16 DIAGNOSIS — Z20822 Contact with and (suspected) exposure to covid-19: Secondary | ICD-10-CM | POA: Diagnosis present

## 2020-08-16 DIAGNOSIS — E11649 Type 2 diabetes mellitus with hypoglycemia without coma: Secondary | ICD-10-CM | POA: Diagnosis present

## 2020-08-16 DIAGNOSIS — G9341 Metabolic encephalopathy: Secondary | ICD-10-CM | POA: Diagnosis present

## 2020-08-16 DIAGNOSIS — G901 Familial dysautonomia [Riley-Day]: Secondary | ICD-10-CM | POA: Diagnosis present

## 2020-08-16 DIAGNOSIS — E669 Obesity, unspecified: Secondary | ICD-10-CM | POA: Diagnosis present

## 2020-08-16 DIAGNOSIS — Z992 Dependence on renal dialysis: Secondary | ICD-10-CM | POA: Diagnosis not present

## 2020-08-16 DIAGNOSIS — I872 Venous insufficiency (chronic) (peripheral): Secondary | ICD-10-CM | POA: Diagnosis present

## 2020-08-16 DIAGNOSIS — E1141 Type 2 diabetes mellitus with diabetic mononeuropathy: Secondary | ICD-10-CM | POA: Diagnosis present

## 2020-08-16 DIAGNOSIS — D631 Anemia in chronic kidney disease: Secondary | ICD-10-CM | POA: Diagnosis present

## 2020-08-16 DIAGNOSIS — E785 Hyperlipidemia, unspecified: Secondary | ICD-10-CM | POA: Diagnosis present

## 2020-08-16 DIAGNOSIS — E1149 Type 2 diabetes mellitus with other diabetic neurological complication: Secondary | ICD-10-CM | POA: Diagnosis present

## 2020-08-16 DIAGNOSIS — E1169 Type 2 diabetes mellitus with other specified complication: Secondary | ICD-10-CM | POA: Diagnosis not present

## 2020-08-16 LAB — RENAL FUNCTION PANEL
Albumin: 3.3 g/dL — ABNORMAL LOW (ref 3.5–5.0)
Anion gap: 12 (ref 5–15)
BUN: 58 mg/dL — ABNORMAL HIGH (ref 6–20)
CO2: 21 mmol/L — ABNORMAL LOW (ref 22–32)
Calcium: 8.5 mg/dL — ABNORMAL LOW (ref 8.9–10.3)
Chloride: 99 mmol/L (ref 98–111)
Creatinine, Ser: 10.05 mg/dL — ABNORMAL HIGH (ref 0.61–1.24)
GFR, Estimated: 5 mL/min — ABNORMAL LOW (ref >60)
Glucose, Bld: 127 mg/dL — ABNORMAL HIGH (ref 70–99)
Phosphorus: 7.5 mg/dL — ABNORMAL HIGH (ref 2.5–4.6)
Potassium: 3.7 mmol/L (ref 3.5–5.1)
Sodium: 132 mmol/L — ABNORMAL LOW (ref 135–145)

## 2020-08-16 LAB — GLUCOSE, CAPILLARY
Glucose-Capillary: 100 mg/dL — ABNORMAL HIGH (ref 70–99)
Glucose-Capillary: 92 mg/dL (ref 70–99)
Glucose-Capillary: 127 mg/dL — ABNORMAL HIGH (ref 70–99)
Glucose-Capillary: 101 mg/dL — ABNORMAL HIGH (ref 70–99)
Glucose-Capillary: 96 mg/dL (ref 70–99)

## 2020-08-16 LAB — CBC
HCT: 29.5 % — ABNORMAL LOW (ref 39.0–52.0)
Hemoglobin: 10 g/dL — ABNORMAL LOW (ref 13.0–17.0)
MCH: 30.6 pg (ref 26.0–34.0)
MCHC: 33.9 g/dL (ref 30.0–36.0)
MCV: 90.2 fL (ref 80.0–100.0)
Platelets: 186 10*3/uL (ref 150–400)
RBC: 3.27 MIL/uL — ABNORMAL LOW (ref 4.22–5.81)
RDW: 13.2 % (ref 11.5–15.5)
WBC: 6.9 10*3/uL (ref 4.0–10.5)
nRBC: 0 % (ref 0.0–0.2)

## 2020-08-16 LAB — BASIC METABOLIC PANEL
Anion gap: 14 (ref 5–15)
BUN: 61 mg/dL — ABNORMAL HIGH (ref 6–20)
CO2: 24 mmol/L (ref 22–32)
Calcium: 8.5 mg/dL — ABNORMAL LOW (ref 8.9–10.3)
Chloride: 96 mmol/L — ABNORMAL LOW (ref 98–111)
Creatinine, Ser: 11.37 mg/dL — ABNORMAL HIGH (ref 0.61–1.24)
GFR, Estimated: 5 mL/min — ABNORMAL LOW (ref >60)
Glucose, Bld: 75 mg/dL (ref 70–99)
Potassium: 4.3 mmol/L (ref 3.5–5.1)
Sodium: 134 mmol/L — ABNORMAL LOW (ref 135–145)

## 2020-08-16 LAB — AMMONIA: Ammonia: 24 umol/L (ref 9–35)

## 2020-08-16 MED ORDER — LORATADINE 10 MG PO TABS: 10.0000 mg | ORAL_TABLET | Freq: Every day | ORAL | Status: DC

## 2020-08-16 MED ORDER — DEXTROSE 10 % IV SOLN: INTRAVENOUS | Status: AC

## 2020-08-16 MED ORDER — KETOROLAC TROMETHAMINE 30 MG/ML IJ SOLN: 15.0000 mg | Freq: Four times a day (QID) | INTRAMUSCULAR | Status: DC | PRN

## 2020-08-16 MED ORDER — LORATADINE 10 MG PO TABS
10.0000 mg | ORAL_TABLET | Freq: Every day | ORAL | Status: DC
  Administered 2020-08-16 – 2020-08-19 (×4): 10 mg via ORAL
  Filled 2020-08-16 (×5): qty 1

## 2020-08-16 MED ORDER — INSULIN ASPART 100 UNIT/ML IJ SOLN
0.0000 [IU] | Freq: Three times a day (TID) | INTRAMUSCULAR | Status: DC
  Administered 2020-08-18: 1 [IU] via SUBCUTANEOUS

## 2020-08-16 MED ORDER — SALINE SPRAY 0.65 % NA SOLN
1.0000 | NASAL | Status: DC | PRN
  Administered 2020-08-16: 1 via NASAL
  Filled 2020-08-16: qty 44

## 2020-08-16 NOTE — Progress Notes (Signed)
Pt refused Heparin SQ. Pt states that he is allergic to medication and it is on his allergy list. RN messaged MD on call to make aware.   Eleanora Neighbor, RN

## 2020-08-16 NOTE — Progress Notes (Signed)
PROGRESS NOTE    Scott Castillo  MGQ:[id number] DOB: [birth year] DOA: 08/15/2020 PCP: Algis Greenhouse, MD  Brief Narrative: 60 year old male with history of ESRD on hemodialysis MWF, type 2 diabetes mellitus on insulin, polyneuropathy, hypertension was brought to the ED after missed hemodialysis.  When EMS arrived he was noted to have slurred speech, CBG was in the 50s treated with D10 followed by improvement.  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 Bland Span few days ago, etiology unclear, felt that he could have low-level metabolic encephalopathy that flares especially during blood pressure changes on dialysis, recent episodes of hypoglycemia could also be contributing -He also suspected a C7 radiculopathy -Dr. Johnney Ou discussed with neurology, another EEG ordered  Hypoglycemia Type 2 diabetes mellitus -Blood sugars in the 40s and 50s yesterday, started on a D10 drip -He is on Tresiba 65 units daily which was discontinued, follow-up hemoglobin A1c -Attempt to gradually cut down D10 drip today  Hypertension -Stable, currently on irbesartan and torsemide  Anemia of chronic disease  DVT prophylaxis: Heparin subcutaneous Code Status: Full code Family Communication: No family at bedside Disposition Plan:  Status is: Observation  The patient will require care spanning > 2 midnights and should be moved to inpatient because: Inpatient  level of care appropriate due to severity of illness hypoglycemia, ongoing aphasia and slurred speech work-up  Dispo: The patient is from: Home              Anticipated d/c is to: Home              Patient currently is not medically stable to d/c.   Difficult to place patient No  Consultants:  Nephrology   Procedures:   Antimicrobials:    Subjective: -CBGs low overnight, currently on a D10 drip  Objective: Vitals:   08/15/20 1830 08/15/20 1953 08/16/20 0608 08/16/20 0849  BP: (!) 188/101 (!) 129/105 (!) 169/90 (!) 185/90  Pulse: 86 90 76 79  Resp: 18 20 19 18   Temp: 97.6 F (36.4 C) 98.6 F (37 C) 97.9 F (36.6 C) 97.6 F (36.4 C)  TempSrc: Oral  Oral Oral  SpO2: 95% 97% 94% 98%  Weight: 113 kg     Height:        Intake/Output Summary (Last 24 hours) at 08/16/2020 1120 Last data filed at 08/16/2020 2671 Gross per 24 hour  Intake 2215.46 ml  Output 2050 ml  Net 165.46 ml   Filed Weights   08/15/20 1031 08/15/20 1517 08/15/20 1830  Weight: 114.3 kg 115.1 kg 113 kg    Examination:  General exam: Pleasant male sitting up in the chair, AAOx3, no distress CVS: S1-S2, regular rate rhythm  Lungs: Clear bilaterally Abdomen: Soft, nontender, bowel sounds present Extremities: No edema  Skin: No rashes, lesions or ulcers Neuro: Moves all extremities, no localizing signs Psychiatry:Mood & affect appropriate.     Data Reviewed:   CBC: Recent Labs  Lab 08/15/20 1034 08/15/20 2023  WBC 11.4* 8.9  NEUTROABS  9.5*  --   HGB 11.3* 10.8*  HCT 34.0* 31.3*  MCV 93.2 88.9  PLT 226 366   Basic Metabolic Panel: Recent Labs  Lab 08/15/20 1034 08/15/20 2023 08/16/20 0405  NA 134* 132* 134*  K 6.2* 3.7 4.3  CL 99 99 96*  CO2 17* 21* 24  GLUCOSE 71 127* 75  BUN 115* 58* 61*  CREATININE 16.60* 10.05* 11.37*  CALCIUM 8.4* 8.5* 8.5*  PHOS  --  7.5*  --    GFR: Estimated Creatinine Clearance: 9.2 mL/min (A) (by C-G formula based on SCr of 11.37 mg/dL  (H)). Liver Function Tests: Recent Labs  Lab 08/15/20 2023  ALBUMIN 3.3*   No results for input(s): LIPASE, AMYLASE in the last 168 hours. Recent Labs  Lab 08/15/20 1322 08/16/20 0405  AMMONIA 32 24   Coagulation Profile: No results for input(s): INR, PROTIME in the last 168 hours. Cardiac Enzymes: No results for input(s): CKTOTAL, CKMB, CKMBINDEX, TROPONINI in the last 168 hours. BNP (last 3 results) No results for input(s): PROBNP in the last 8760 hours. HbA1C: No results for input(s): HGBA1C in the last 72 hours. CBG: Recent Labs  Lab 08/15/20 1857 08/15/20 1908 08/15/20 1959 08/15/20 2201 08/16/20 0848  GLUCAP 65* 58* 134* 149* 100*   Lipid Profile: No results for input(s): CHOL, HDL, LDLCALC, TRIG, CHOLHDL, LDLDIRECT in the last 72 hours. Thyroid Function Tests: No results for input(s): TSH, T4TOTAL, FREET4, T3FREE, THYROIDAB in the last 72 hours. Anemia Panel: No results for input(s): VITAMINB12, FOLATE, FERRITIN, TIBC, IRON, RETICCTPCT in the last 72 hours. Urine analysis:    Component Value Date/Time   COLORURINE YELLOW 06/16/2020 [place] 06/16/2020 1244   LABSPEC 1.013 06/16/2020 1244   PHURINE 8.0 06/16/2020 1244   GLUCOSEU 150 (A) 06/16/2020 1244   HGBUR SMALL (A) 06/16/2020 1244   [place] NEGATIVE 06/16/2020 [place] 06/16/2020 1244   PROTEINUR >=300 (A) 06/16/2020 1244   NITRITE NEGATIVE 06/16/2020 1244   LEUKOCYTESUR TRACE (A) 06/16/2020 1244   Sepsis Labs: @LABRCNTIP (procalcitonin:4,lacticidven:4)  ) Recent Results (from the past 240 hour(s))  Resp Panel by RT-PCR (Flu A&B, Covid) Nasopharyngeal Swab     Status: None   Collection Time: 08/15/20  1:22 PM   Specimen: Nasopharyngeal Swab; Nasopharyngeal(NP) swabs in vial transport medium  Result Value Ref Range Status   SARS Coronavirus 2 by RT PCR NEGATIVE NEGATIVE Final    Comment: (NOTE) SARS-CoV-2 target nucleic acids are NOT DETECTED.  The  SARS-CoV-2 RNA is generally detectable in upper respiratory specimens during the acute phase of infection. The lowest concentration of SARS-CoV-2 viral copies this assay can detect is 138 copies/mL. A negative result does not preclude SARS-Cov-2 infection and should not be used as the sole basis for treatment or other patient management decisions. A negative result may occur with  improper specimen collection/handling, submission of specimen other than nasopharyngeal swab, presence of viral mutation(s) within the areas targeted by this assay, and inadequate number of viral copies(<138 copies/mL). A negative result must be combined with clinical observations, patient history, and epidemiological information. The expected result is Negative.  Fact Sheet for Patients:  EntrepreneurPulse.com.au  Fact Sheet for Healthcare Providers:  IncredibleEmployment.be  This test is no t yet approved or cleared by the [place] FDA and  has been authorized for detection and/or diagnosis of SARS-CoV-2 by FDA under an Emergency Use Authorization (EUA). This EUA will remain  in effect (meaning this test can be used) for  the duration of the COVID-19 declaration under Section 564(b)(1) of the Act, 21 U.S.C.section 360bbb-3(b)(1), unless the authorization is terminated  or revoked sooner.       Influenza A by PCR NEGATIVE NEGATIVE Final   Influenza B by PCR NEGATIVE NEGATIVE Final    Comment: (NOTE) The Xpert Xpress SARS-CoV-2/FLU/RSV plus assay is intended as an aid in the diagnosis of influenza from Nasopharyngeal swab specimens and should not be used as a sole basis for treatment. Nasal washings and aspirates are unacceptable for Xpert Xpress SARS-CoV-2/FLU/RSV testing.  Fact Sheet for Patients: EntrepreneurPulse.com.au  Fact Sheet for Healthcare Providers: IncredibleEmployment.be  This test is not yet approved or  cleared by the [place] FDA and has been authorized for detection and/or diagnosis of SARS-CoV-2 by FDA under an Emergency Use Authorization (EUA). This EUA will remain in effect (meaning this test can be used) for the duration of the COVID-19 declaration under Section 564(b)(1) of the Act, 21 U.S.C. section 360bbb-3(b)(1), unless the authorization is terminated or revoked.  Performed at [facility], [place] [address] [place], [place] [address]     Radiology Studies: DG Chest Port 1 View  Result Date: 08/15/2020 CLINICAL DATA:  Short of breath.  Dialysis patient. EXAM: PORTABLE CHEST 1 VIEW COMPARISON:  05/02/2020 FINDINGS: Heart size upper normal. Mild vascular congestion without edema or effusion. Negative for infiltrate. IMPRESSION: Mild vascular congestion without edema Electronically Signed   By: Franchot Gallo M.D.   On: 08/15/2020 11:02   Scheduled Meds: . carvedilol  25 mg Oral BID WC  . Chlorhexidine Gluconate Cloth  6 each Topical Q0600  . ferric citrate  420 mg Oral TID WC  . heparin  5,000 Units Subcutaneous Q8H  . insulin aspart  0-6 Units Subcutaneous TID WC  . irbesartan  150 mg Oral Daily  . loratadine  10 mg Oral Daily  . pravastatin  80 mg Oral QPM  . torsemide  100 mg Oral Daily   Continuous Infusions: . dextrose 50 mL/hr (08/16/20 0816)     LOS: 0 days    Time spent: 73min  Domenic Polite, MD [facility]  08/16/2020, 11:20 AM

## 2020-08-16 NOTE — Progress Notes (Signed)
[facility] Progress Note   Subjective:   Seen in room, feeling well this AM but reports he had another episode of confusion towards the end of dialysis yesterday. Currently alert and oriented x3. Denies SOB, CP, palpitations, dizziness, abdominal pain and nausea  Objective Vitals:   08/15/20 1800 08/15/20 1830 08/15/20 1953 08/16/20 0608  BP: (!) 188/94 (!) 188/101 (!) 129/105 (!) 169/90  Pulse: 88 86 90 76  Resp:  18 20 19   Temp:  97.6 F (36.4 C) 98.6 F (37 C) 97.9 F (36.6 C)  TempSrc:  Oral  Oral  SpO2:  95% 97% 94%  Weight:  113 kg    Height:       Physical Exam General: WDWN male, alert and in NAD Heart: RRR, no murmur, rubs or gallops Lungs: CTA bilaterally without wheezing, rhonchi or rales Abdomen: Soft, non-distended, non-tender, +BS Extremities: Chronic appearing brawny discoloration of bilateral lower legs, no pitting edema Dialysis Access: LUE AVF + bruit  Additional Objective Labs: Basic Metabolic Panel: Recent Labs  Lab 08/15/20 1034 08/15/20 2023 08/16/20 0405  NA 134* 132* 134*  K 6.2* 3.7 4.3  CL 99 99 96*  CO2 17* 21* 24  GLUCOSE 71 127* 75  BUN 115* 58* 61*  CREATININE 16.60* 10.05* 11.37*  CALCIUM 8.4* 8.5* 8.5*  PHOS  --  7.5*  --    Liver Function Tests: Recent Labs  Lab 08/15/20 2023  ALBUMIN 3.3*   CBC: Recent Labs  Lab 08/15/20 1034 08/15/20 2023  WBC 11.4* 8.9  NEUTROABS 9.5*  --   HGB 11.3* 10.8*  HCT 34.0* 31.3*  MCV 93.2 88.9  PLT 226 210   CBG: Recent Labs  Lab 08/15/20 1300 08/15/20 1857 08/15/20 1908 08/15/20 1959 08/15/20 2201  GLUCAP 93 65* 58* 134* 149*   Studies/Results: DG Chest Port 1 View  Result Date: 08/15/2020 CLINICAL DATA:  Short of breath.  Dialysis patient. EXAM: PORTABLE CHEST 1 VIEW COMPARISON:  05/02/2020 FINDINGS: Heart size upper normal. Mild vascular congestion without edema or effusion. Negative for infiltrate. IMPRESSION: Mild vascular congestion without edema  Electronically Signed   By: Franchot Gallo M.D.   On: 08/15/2020 11:02   Medications: . dextrose 50 mL/hr (08/16/20 0816)   . carvedilol  25 mg Oral BID WC  . Chlorhexidine Gluconate Cloth  6 each Topical Q0600  . ferric citrate  420 mg Oral TID WC  . heparin  5,000 Units Subcutaneous Q8H  . insulin aspart  0-6 Units Subcutaneous TID WC  . irbesartan  150 mg Oral Daily  . loratadine  10 mg Oral Daily  . pravastatin  80 mg Oral QPM  . torsemide  100 mg Oral Daily    Outpatient Dialysis Orders: Center: [facility] on MWF. Optiflux 200NRe, 4:15hr, BFR 400, DFR 500, EDW 111.5kg, 2K/2Ca, UF profile 2, AVF 15g, no heparin  Assessment/Plan:  1. Missed dialysis/ ESRD:  Dialyzes on MWF schedule. Pt has only had 2.5 hours of dialysis in the past week due to issues with pinched nerve in his neck. K+, BUN and Cr elevated on admission, improved after HD yesterday. Will plan for HD again today.  2. Episodic aphasia/pinched cervical nerve: Pt has episodes of aphasia for several months which he associates with pain in his neck. Has been evaluated outpatient, no clear metabolic cause found for these episodes and they are now happening on and off HD. Seen by neuro con 4/28, see note from [facility]. Happened again with HD yesterday,  neuro consulted and will try to arrange for EEG during dialysis.  3.  Hypertension/volume: Hypertensive, CXR with mild vascular congestion but no shortness of breath. UF to EDW today as tolerated.  4. Hypoglycemia: BS in the 50's with EMS and 40's in the ED. May need insulin dose lowered, mgt per admitting team.   5.  Anemia: Hgb 10.8. No ESA indicated at this time 6.  Metabolic bone disease: Calcium controlled. Not on VDRA. Phos elevated, continue phosphorus binders.    Anice Paganini, PA-C 08/16/2020, 8:41 AM  [facility] Pager: [phone]

## 2020-08-16 NOTE — Care Management Obs Status (Signed)
[place] NOTIFICATION   Patient Details  Name: Scott Castillo MRN: [id number] Date of Birth: 1960-10-12   Medicare Observation Status Notification Given:  Yes    Norina Buzzard, RN 08/16/2020, 10:46 AM

## 2020-08-16 NOTE — Progress Notes (Signed)
Overnight EEG in process, result pending.

## 2020-08-17 DIAGNOSIS — R4701 Aphasia: Secondary | ICD-10-CM | POA: Diagnosis not present

## 2020-08-17 DIAGNOSIS — N186 End stage renal disease: Secondary | ICD-10-CM | POA: Diagnosis not present

## 2020-08-17 DIAGNOSIS — E162 Hypoglycemia, unspecified: Secondary | ICD-10-CM | POA: Diagnosis not present

## 2020-08-17 LAB — GLUCOSE, CAPILLARY
Glucose-Capillary: 55 mg/dL — ABNORMAL LOW (ref 70–99)
Glucose-Capillary: 109 mg/dL — ABNORMAL HIGH (ref 70–99)
Glucose-Capillary: 95 mg/dL (ref 70–99)
Glucose-Capillary: 96 mg/dL (ref 70–99)
Glucose-Capillary: 108 mg/dL — ABNORMAL HIGH (ref 70–99)

## 2020-08-17 MED ORDER — CHLORHEXIDINE GLUCONATE CLOTH 2 % EX PADS
6.0000 | MEDICATED_PAD | Freq: Every day | CUTANEOUS | Status: DC
  Administered 2020-08-17 – 2020-08-18 (×2): 6 via TOPICAL

## 2020-08-17 NOTE — Procedures (Addendum)
Patient Name: Scott Castillo  MRN: [id number]  Epilepsy Attending: Lora Havens  Referring Physician/Provider: Dr Su Monks Duration: 08/16/2020 1173 to 08/17/2020 0855  Patient history: 60yo M with episodic aphasia, slurred speech,  suspected o be closely associated with his hemodialysis. EEG to evaluate for seizure.  Level of alertness: Awake, asleep  AEDs during EEG study: None  Technical aspects: This EEG study was done with scalp electrodes positioned according to the 10-20 International system of electrode placement. Electrical activity was acquired at a sampling rate of 500Hz  and reviewed with a high frequency filter of 70Hz  and a low frequency filter of 1Hz . EEG data were recorded continuously and digitally stored.   Description: The posterior dominant rhythm consists of 7.5 Hz activity of moderate voltage (25-35 uV) seen predominantly in posterior head regions, symmetric and reactive to eye opening and eye closing. Sleep was characterized by vertex waves, sleep spindles (12 to 14 Hz), maximal frontocentral region.  EEG showed intermittent generalized and maximal left temporal 3 to 6 Hz theta-delta slowing. Hyperventilation and photic stimulation were not performed.     ABNORMALITY - Intermittent slow, generalized and maximal left temporal region  IMPRESSION: This study is suggestive of cortical dysfunction arising from left temporal region, nonspecific etiology. Additionally, there iss mild diffuse encephalopathy, nonspecific etiology. No seizures or epileptiform discharges were seen throughout the recording.  Larisha Vencill [name]

## 2020-08-17 NOTE — Progress Notes (Signed)
PROGRESS NOTE    LENG MONTESDEOCA  DQQ:[id number] DOB: [birth year] DOA: 08/15/2020 PCP: Algis Greenhouse, MD  Brief Narrative: 60 year old male with history of ESRD on hemodialysis MWF, type 2 diabetes mellitus on insulin, polyneuropathy, hypertension was brought to the ED after missed hemodialysis.  When EMS arrived he was noted to have slurred speech, CBG was in the 50s treated with D10 followed by improvement.  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 Johnney Ou discussed with neurology, overnight EEG was unremarkable, plan to repeat this with dialysis tomorrow  Hypoglycemia Type 2 diabetes mellitus -Blood sugars in the 40s and 50s on admission, weaned off D10 drip later in the day yesterday -CBG of 55 at 7 AM, corrected -He is on Tresiba 65 units daily which was discontinued, follow-up hemoglobin A1c -Monitor CBGs closely today  Hypertension -Stable, currently on irbesartan and torsemide  Anemia of chronic  disease  DVT prophylaxis: Heparin subcutaneous Code Status: Full code Family Communication: No family at bedside Disposition Plan:  Status is:  Inpatient level of care appropriate due to severity of illness hypoglycemia, ongoing aphasia and slurred speech work-up  Dispo: The patient is from: Home              Anticipated d/c is to: Home              Patient currently is not medically stable to d/c.   Difficult to place patient No  Consultants:  Nephrology   Procedures:   Antimicrobials:    Subjective: -Feels okay, had overnight EEG  Objective: Vitals:   08/16/20 1827 08/16/20 2057 08/17/20 0550 08/17/20 0752  BP: (!) 148/86 (!) 149/67 (!) 89/49 (!) 168/79  Pulse: 84 82 74 67  Resp: 18 16 18    Temp: 98.2 F (36.8 C) 98.4 F (36.9 C) 97.7 F (36.5 C)   TempSrc:  Oral Oral   SpO2: 96% 100% 97%   Weight:      Height:        Intake/Output Summary (Last 24 hours) at 08/17/2020 1048 Last data filed at 08/17/2020 0600 Gross per 24 hour  Intake 360 ml  Output 2850 ml  Net -2490 ml   Filed Weights   08/15/20 1830 08/16/20 1315 08/16/20 1725  Weight: 113 kg 117.4 kg 114.5 kg    Examination:  General exam: Pleasant middle-aged male sitting up in bed, AAO x3, no distress CVS: S1-S2, regular rate rhythm Lungs: Clear bilaterally Abdomen: Soft, nontender, bowel sounds present Extremities: No edema  Skin: No rashes, lesions or ulcers Neuro: Moves all extremities, no localizing signs Psychiatry:Mood & affect appropriate.  Data Reviewed:   CBC: Recent Labs  Lab 08/15/20 1034 08/15/20 2023 08/16/20 1249  WBC 11.4* 8.9 6.9  NEUTROABS 9.5*  --   --   HGB 11.3* 10.8* 10.0*  HCT 34.0* 31.3* 29.5*  MCV 93.2 88.9 90.2  PLT 226 210 099   Basic Metabolic Panel: Recent Labs  Lab 08/15/20 1034 08/15/20 2023 08/16/20 0405  NA 134* 132* 134*  K 6.2* 3.7 4.3  CL 99 99 96*  CO2 17* 21* 24  GLUCOSE 71 127* 75  BUN 115* 58* 61*  CREATININE 16.60* 10.05* 11.37*   CALCIUM 8.4* 8.5* 8.5*  PHOS  --  7.5*  --    GFR: Estimated Creatinine Clearance: 9.3 mL/min (A) (by C-G formula based on SCr of 11.37 mg/dL (H)). Liver Function Tests: Recent Labs  Lab 08/15/20 2023  ALBUMIN 3.3*   No results for input(s): LIPASE, AMYLASE in the last 168 hours. Recent Labs  Lab 08/15/20 1322 08/16/20 0405  AMMONIA 32 24   Coagulation Profile: No results for input(s): INR, PROTIME in the last 168 hours. Cardiac Enzymes: No results for input(s): CKTOTAL, CKMB, CKMBINDEX, TROPONINI in the last 168 hours. BNP (last 3 results) No results for input(s): PROBNP in the last 8760 hours. HbA1C: No results for input(s): HGBA1C in the last 72 hours. CBG: Recent Labs  Lab 08/16/20 1602 08/16/20 1824 08/16/20 2053 08/17/20 0702 08/17/20 0742  GLUCAP 127* 101* 96 55* 109*   Lipid Profile: No results for input(s): CHOL, HDL, LDLCALC, TRIG, CHOLHDL, LDLDIRECT in the last 72 hours. Thyroid Function Tests: No results for input(s): TSH, T4TOTAL, FREET4, T3FREE, THYROIDAB in the last 72 hours. Anemia Panel: No results for input(s): VITAMINB12, FOLATE, FERRITIN, TIBC, IRON, RETICCTPCT in the last 72 hours. Urine analysis:    Component Value Date/Time   COLORURINE YELLOW 06/16/2020 [place] 06/16/2020 1244   LABSPEC 1.013 06/16/2020 1244   PHURINE 8.0 06/16/2020 1244   GLUCOSEU 150 (A) 06/16/2020 1244   HGBUR SMALL (A) 06/16/2020 1244   [place] NEGATIVE 06/16/2020 [place] 06/16/2020 1244   PROTEINUR >=300 (A) 06/16/2020 1244   NITRITE NEGATIVE 06/16/2020 1244   LEUKOCYTESUR TRACE (A) 06/16/2020 1244   Sepsis Labs: @LABRCNTIP (procalcitonin:4,lacticidven:4)  ) Recent Results (from the past 240 hour(s))  Resp Panel by RT-PCR (Flu A&B, Covid) Nasopharyngeal Swab     Status: None   Collection Time: 08/15/20  1:22 PM   Specimen: Nasopharyngeal Swab; Nasopharyngeal(NP) swabs in vial transport medium  Result Value Ref Range  Status   SARS Coronavirus 2 by RT PCR NEGATIVE NEGATIVE Final    Comment: (NOTE) SARS-CoV-2 target nucleic acids are NOT DETECTED.  The SARS-CoV-2 RNA is generally detectable in upper respiratory specimens during the acute phase of infection. The lowest concentration of SARS-CoV-2 viral copies this assay can detect is 138 copies/mL. A negative result does not preclude SARS-Cov-2 infection and should not be used as the sole basis for treatment or other patient management decisions. A negative result may occur with  improper specimen collection/handling, submission of specimen other than nasopharyngeal swab, presence of viral mutation(s) within the areas targeted by this assay, and inadequate number of viral copies(<138 copies/mL). A negative result must be combined with clinical observations, patient history, and epidemiological information. The expected result is Negative.  Fact Sheet for Patients:  EntrepreneurPulse.com.au  Fact Sheet for Healthcare Providers:  IncredibleEmployment.be  This test is no t yet approved or cleared by the [place] FDA and  has been  authorized for detection and/or diagnosis of SARS-CoV-2 by FDA under an Emergency Use Authorization (EUA). This EUA will remain  in effect (meaning this test can be used) for the duration of the COVID-19 declaration under Section 564(b)(1) of the Act, 21 U.S.C.section 360bbb-3(b)(1), unless the authorization is terminated  or revoked sooner.       Influenza A by PCR NEGATIVE NEGATIVE Final   Influenza B by PCR NEGATIVE NEGATIVE Final    Comment: (NOTE) The Xpert Xpress SARS-CoV-2/FLU/RSV plus assay is intended as an aid in the diagnosis of influenza from Nasopharyngeal swab specimens and should not be used as a sole basis for treatment. Nasal washings and aspirates are unacceptable for Xpert Xpress SARS-CoV-2/FLU/RSV testing.  Fact Sheet for  Patients: EntrepreneurPulse.com.au  Fact Sheet for Healthcare Providers: IncredibleEmployment.be  This test is not yet approved or cleared by the [place] FDA and has been authorized for detection and/or diagnosis of SARS-CoV-2 by FDA under an Emergency Use Authorization (EUA). This EUA will remain in effect (meaning this test can be used) for the duration of the COVID-19 declaration under Section 564(b)(1) of the Act, 21 U.S.C. section 360bbb-3(b)(1), unless the authorization is terminated or revoked.  Performed at [facility], [place] [address] [place], [place] [address]     Radiology Studies: Overnight EEG with video  Result Date: 08/17/2020 Lora Havens, MD     08/17/2020  8:48 AM Patient Name: Scott Castillo MRN: [id number] Epilepsy Attending: Lora Havens Referring Physician/Provider: Dr Su Monks Duration: 08/16/2020 0855 to 08/17/2020 0845 Patient history: 60yo M with episodic aphasia, slurred speech,  suspected o be closely associated with his hemodialysis. EEG to evaluate for seizure. Level of alertness: Awake, asleep AEDs during EEG study: None Technical aspects: This EEG study was done with scalp electrodes positioned according to the 10-20 International system of electrode placement. Electrical activity was acquired at a sampling rate of 500Hz  and reviewed with a high frequency filter of 70Hz  and a low frequency filter of 1Hz . EEG data were recorded continuously and digitally stored. Description: The posterior dominant rhythm consists of 7.5 Hz activity of moderate voltage (25-35 uV) seen predominantly in posterior head regions, symmetric and reactive to eye opening and eye closing. Sleep was characterized by vertex waves, sleep spindles (12 to 14 Hz), maximal frontocentral region.  EEG showed intermittent generalized and maximal left temporal 3 to 6 Hz theta-delta slowing. Hyperventilation and photic stimulation were not  performed.   ABNORMALITY - Intermittent slow, generalized and maximal left temporal region IMPRESSION: This study is suggestive of cortical dysfunction arising from left temporal region, nonspecific etiology. Additionally, there iss mild diffuse encephalopathy, nonspecific etiology. No seizures or epileptiform discharges were seen throughout the recording. Priyanka [name]   Scheduled Meds: . carvedilol  25 mg Oral BID WC  . Chlorhexidine Gluconate Cloth  6 each Topical Q0600  . ferric citrate  420 mg Oral TID WC  . heparin  5,000 Units Subcutaneous Q8H  . insulin aspart  0-6 Units Subcutaneous TID WC  . irbesartan  150 mg Oral Daily  . loratadine  10 mg Oral Daily  . pravastatin  80 mg Oral QPM  . torsemide  100 mg Oral Daily   Continuous Infusions:    LOS: 1 day    Time spent: 59min  Domenic Polite, MD [facility]  08/17/2020, 10:48 AM

## 2020-08-17 NOTE — Progress Notes (Signed)
CBG 55 this am, juice was given to patient and dayshift RN is aware to recheck CBG in about 15 minutes.   Eleanora Neighbor, RN

## 2020-08-17 NOTE — Progress Notes (Signed)
[facility] Progress Note   Subjective:   Pt underwent HD yesterday with EEG, results pending. He reports he felt a little "off" at the end of HD but did not have any aphasia or confusion. Feeling well this AM, no SOB, CP, palpitations, dizziness, abdominal pain, nausea or vomiting. CBG was 55 this AM, 109 on recheck after juice.   Objective Vitals:   08/16/20 1827 08/16/20 2057 08/17/20 0550 08/17/20 0752  BP: (!) 148/86 (!) 149/67 (!) 89/49 (!) 168/79  Pulse: 84 82 74 67  Resp: 18 16 18    Temp: 98.2 F (36.8 C) 98.4 F (36.9 C) 97.7 F (36.5 C)   TempSrc:  Oral Oral   SpO2: 96% 100% 97%   Weight:      Height:       Physical Exam General: WDWN male, alert and in NAD Heart: RRR, no murmur, rubs of gallops Lungs: CTA bilaterally without wheezing, rhonchi, or rales Abdomen: Soft, non-tender, non-distended, +BS Extremities: No edema b/l lower extremities Dialysis Access:  LUE AVF + bruit  Additional Objective Labs: Basic Metabolic Panel: Recent Labs  Lab 08/15/20 1034 08/15/20 2023 08/16/20 0405  NA 134* 132* 134*  K 6.2* 3.7 4.3  CL 99 99 96*  CO2 17* 21* 24  GLUCOSE 71 127* 75  BUN 115* 58* 61*  CREATININE 16.60* 10.05* 11.37*  CALCIUM 8.4* 8.5* 8.5*  PHOS  --  7.5*  --    Liver Function Tests: Recent Labs  Lab 08/15/20 2023  ALBUMIN 3.3*   CBC: Recent Labs  Lab 08/15/20 1034 08/15/20 2023 08/16/20 1249  WBC 11.4* 8.9 6.9  NEUTROABS 9.5*  --   --   HGB 11.3* 10.8* 10.0*  HCT 34.0* 31.3* 29.5*  MCV 93.2 88.9 90.2  PLT 226 210 186   CBG: Recent Labs  Lab 08/16/20 1602 08/16/20 1824 08/16/20 2053 08/17/20 0702 08/17/20 0742  GLUCAP 127* 101* 96 55* 109*    Studies/Results: DG Chest Port 1 View  Result Date: 08/15/2020 CLINICAL DATA:  Short of breath.  Dialysis patient. EXAM: PORTABLE CHEST 1 VIEW COMPARISON:  05/02/2020 FINDINGS: Heart size upper normal. Mild vascular congestion without edema or effusion. Negative for  infiltrate. IMPRESSION: Mild vascular congestion without edema Electronically Signed   By: Franchot Gallo M.D.   On: 08/15/2020 11:02   Medications:  . carvedilol  25 mg Oral BID WC  . Chlorhexidine Gluconate Cloth  6 each Topical Q0600  . ferric citrate  420 mg Oral TID WC  . heparin  5,000 Units Subcutaneous Q8H  . insulin aspart  0-6 Units Subcutaneous TID WC  . irbesartan  150 mg Oral Daily  . loratadine  10 mg Oral Daily  . pravastatin  80 mg Oral QPM  . torsemide  100 mg Oral Daily    [facility] Optiflux 200[facility]Re, 4:15hr, BFR 400, DFR 500, EDW 111.5kg, 2K/2Ca, UF profile 2, AVF 15g, no heparin  Assessment/Plan:  1. Missed dialysis/ESRD:Dialyzes on MWF schedule. Pt has only had 2.5 hours of dialysis the week prior to admission due to issues with pinched nerve in his neck. K+, BUN and Cr elevated on admission, improved after HD Friday and Saturday. Next HD will be Monday.  2. Episodic aphasia/pinched cervical nerve: Pt has episodes of aphasia for several months which he associates with pain in his neck. Has been evaluated outpatient, no clear metabolic cause found for these episodes and they are now happening on and off HD. Seen  by neuro con 4/28, see note from [facility]. No "episodes" during HD yesterday, EEG interpretation pending.  3. Hypertension/volume:Hypertensive on admisison, CXR with mild vascular congestion but no shortness of breath. BP now improved. UF to EDW today as tolerated.  4. Hypoglycemia: Recurrent hypoglycemia, basal insulin on hold.  5. Anemia:Hgb 10.0, if decreases further start ESA. 6. Metabolic bone disease:Calcium controlled. Not on VDRA. Phos elevated, continue phosphorus binders.    Anice Paganini, PA-C 08/17/2020, 8:39 AM  [facility] Pager: [phone]

## 2020-08-17 NOTE — Progress Notes (Signed)
Patient reported that he felt an electric shock wave on the right side of his face around 8:20 pm that lasted for about 5 secs. He is on continuous EEG. Neurologist notified.

## 2020-08-18 DIAGNOSIS — R4701 Aphasia: Secondary | ICD-10-CM | POA: Diagnosis not present

## 2020-08-18 DIAGNOSIS — E162 Hypoglycemia, unspecified: Secondary | ICD-10-CM | POA: Diagnosis not present

## 2020-08-18 DIAGNOSIS — N186 End stage renal disease: Secondary | ICD-10-CM | POA: Diagnosis not present

## 2020-08-18 LAB — CBC
HCT: 30.9 % — ABNORMAL LOW (ref 39.0–52.0)
Hemoglobin: 10.3 g/dL — ABNORMAL LOW (ref 13.0–17.0)
MCH: 30.1 pg (ref 26.0–34.0)
MCHC: 33.3 g/dL (ref 30.0–36.0)
MCV: 90.4 fL (ref 80.0–100.0)
Platelets: 177 10*3/uL (ref 150–400)
RBC: 3.42 MIL/uL — ABNORMAL LOW (ref 4.22–5.81)
RDW: 12.8 % (ref 11.5–15.5)
WBC: 6.5 10*3/uL (ref 4.0–10.5)
nRBC: 0 % (ref 0.0–0.2)

## 2020-08-18 LAB — BASIC METABOLIC PANEL
Anion gap: 12 (ref 5–15)
BUN: 64 mg/dL — ABNORMAL HIGH (ref 6–20)
CO2: 23 mmol/L (ref 22–32)
Calcium: 8.8 mg/dL — ABNORMAL LOW (ref 8.9–10.3)
Chloride: 97 mmol/L — ABNORMAL LOW (ref 98–111)
Creatinine, Ser: 10.71 mg/dL — ABNORMAL HIGH (ref 0.61–1.24)
GFR, Estimated: 5 mL/min — ABNORMAL LOW (ref >60)
Glucose, Bld: 93 mg/dL (ref 70–99)
Potassium: 5.1 mmol/L (ref 3.5–5.1)
Sodium: 132 mmol/L — ABNORMAL LOW (ref 135–145)

## 2020-08-18 LAB — GLUCOSE, CAPILLARY
Glucose-Capillary: 52 mg/dL — ABNORMAL LOW (ref 70–99)
Glucose-Capillary: 90 mg/dL (ref 70–99)
Glucose-Capillary: 80 mg/dL (ref 70–99)
Glucose-Capillary: 91 mg/dL (ref 70–99)
Glucose-Capillary: 167 mg/dL — ABNORMAL HIGH (ref 70–99)
Glucose-Capillary: 115 mg/dL — ABNORMAL HIGH (ref 70–99)

## 2020-08-18 MED ORDER — TORSEMIDE 100 MG PO TABS
100.0000 mg | ORAL_TABLET | ORAL | Status: DC
  Administered 2020-08-19: 100 mg via ORAL
  Filled 2020-08-18: qty 1

## 2020-08-18 NOTE — Progress Notes (Signed)
Patient is back from dialysis per nurse, will connect patient back to monitoring.

## 2020-08-18 NOTE — Progress Notes (Signed)
LTM maintenance completed; reglued most electrodes and advised nurse and patient cannot get out of bed- call nurse for urinal. No skin breakdown was seen.

## 2020-08-18 NOTE — Progress Notes (Addendum)
PROGRESS NOTE    Scott Castillo  XKG:[id number] DOB: 1960/07/23 DOA: 08/15/2020 PCP: Algis Greenhouse, MD  Brief Narrative: 60 year old male with history of ESRD on hemodialysis MWF, type 2 diabetes mellitus on insulin, polyneuropathy, hypertension was brought to the ED after missed hemodialysis.  When EMS arrived he was noted to have slurred speech, CBG was in the 50s treated with D10 followed by improvement.  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 Johnney Ou discussed with neurology, overnight EEG was unremarkable for epileptiform activity, plan to repeat EEG monitoring with dialysis today  Hypoglycemia Type 2 diabetes mellitus -Blood sugars in the 40s and 50s on admission, weaned off D10 drip later in the day -1 episode of hypoglycemia yesterday morning and this morning -He is on Tresiba 66 units daily which was discontinued, follow-up hemoglobin A1c -Monitor CBGs closely today -Recommended continuous glucose  monitoring  Hypertension -Stable, currently on irbesartan and torsemide  Anemia of chronic disease  DVT prophylaxis: Heparin subcutaneous Code Status: Full code Family Communication: No family at bedside Disposition Plan:  Status is:  Inpatient level of care appropriate due to severity of illness hypoglycemia, ongoing aphasia and slurred speech work-up  Dispo: The patient is from: Home              Anticipated d/c is to: Home likely tomorrow              Patient currently is not medically stable to d/c.   Difficult to place patient No  Consultants:  Nephrology   Procedures:   Antimicrobials:    Subjective: -Had overnight EEG, this morning with episode of hypoglycemia again  Objective: Vitals:   08/17/20 1803 08/17/20 2052 08/18/20 0529 08/18/20 1033  BP: (!) 159/84 (!) 134/58 139/65 (!) 192/90  Pulse: 71 68 62 71  Resp: 16 16 16 19   Temp: 97.8 F (36.6 C) 99.3 F (37.4 C) (!) 97.4 F (36.3 C) 98.9 F (37.2 C)  TempSrc: Oral Oral  Oral  SpO2: 94% 95% 95% 99%  Weight:      Height:        Intake/Output Summary (Last 24 hours) at 08/18/2020 1149 Last data filed at 08/18/2020 0730 Gross per 24 hour  Intake 780 ml  Output 0 ml  Net 780 ml   Filed Weights   08/15/20 1830 08/16/20 1315 08/16/20 1725  Weight: 113 kg 117.4 kg 114.5 kg    Examination:  General exam: Pleasant middle-aged male sitting up in bed, AAOx3, no distress HEENT: EEG leads on CVS: S1-S2, regular rate rhythm Lungs: Clear bilaterally Abdomen: Soft, nontender, bowel sounds present Extremities:  No edema Skin: No rashes on exposed skin Neuro: Moves all extremities, no localizing signs  Psychiatry:Mood & affect appropriate.     Data Reviewed:   CBC: Recent Labs  Lab 08/15/20 1034 08/15/20 2023 08/16/20 1249  WBC 11.4* 8.9 6.9  NEUTROABS 9.5*  --   --   HGB 11.3* 10.8* 10.0*  HCT 34.0* 31.3* 29.5*  MCV 93.2 88.9 90.2  PLT 226 210 546   Basic Metabolic Panel: Recent Labs  Lab  08/15/20 1034 08/15/20 2023 08/16/20 0405  NA 134* 132* 134*  K 6.2* 3.7 4.3  CL 99 99 96*  CO2 17* 21* 24  GLUCOSE 71 127* 75  BUN 115* 58* 61*  CREATININE 16.60* 10.05* 11.37*  CALCIUM 8.4* 8.5* 8.5*  PHOS  --  7.5*  --    GFR: Estimated Creatinine Clearance: 9.3 mL/min (A) (by C-G formula based on SCr of 11.37 mg/dL (H)). Liver Function Tests: Recent Labs  Lab 08/15/20 2023  ALBUMIN 3.3*   No results for input(s): LIPASE, AMYLASE in the last 168 hours. Recent Labs  Lab 08/15/20 1322 08/16/20 0405  AMMONIA 32 24   Coagulation Profile: No results for input(s): INR, PROTIME in the last 168 hours. Cardiac Enzymes: No results for input(s): CKTOTAL, CKMB, CKMBINDEX, TROPONINI in the last 168 hours. BNP (last 3 results) No results for input(s): PROBNP in the last 8760 hours. HbA1C: No results for input(s): HGBA1C in the last 72 hours. CBG: Recent Labs  Lab 08/17/20 2054 08/18/20 0526 08/18/20 0602 08/18/20 0639 08/18/20 1109  GLUCAP 108* 52* 90 80 91   Lipid Profile: No results for input(s): CHOL, HDL, LDLCALC, TRIG, CHOLHDL, LDLDIRECT in the last 72 hours. Thyroid Function Tests: No results for input(s): TSH, T4TOTAL, FREET4, T3FREE, THYROIDAB in the last 72 hours. Anemia Panel: No results for input(s): VITAMINB12, FOLATE, FERRITIN, TIBC, IRON, RETICCTPCT in the last 72 hours. Urine analysis:    Component Value Date/Time   COLORURINE YELLOW 06/16/2020 [place] 06/16/2020 1244   LABSPEC 1.013 06/16/2020 1244   PHURINE 8.0 06/16/2020 1244   GLUCOSEU 150 (A) 06/16/2020 1244   HGBUR SMALL (A) 06/16/2020 1244   [place] NEGATIVE 06/16/2020 [place] 06/16/2020 1244   PROTEINUR >=300 (A) 06/16/2020 1244   NITRITE NEGATIVE 06/16/2020 1244   LEUKOCYTESUR TRACE (A) 06/16/2020 1244   Sepsis Labs: @LABRCNTIP (procalcitonin:4,lacticidven:4)  ) Recent Results (from the past 240 hour(s))  Resp Panel by RT-PCR (Flu A&B, Covid)  Nasopharyngeal Swab     Status: None   Collection Time: 08/15/20  1:22 PM   Specimen: Nasopharyngeal Swab; Nasopharyngeal(NP) swabs in vial transport medium  Result Value Ref Range Status   SARS Coronavirus 2 by RT PCR NEGATIVE NEGATIVE Final    Comment: (NOTE) SARS-CoV-2 target nucleic acids are NOT DETECTED.  The SARS-CoV-2 RNA is generally detectable in upper respiratory specimens during the acute phase of infection. The lowest concentration of SARS-CoV-2 viral copies this assay can detect is 138 copies/mL. A negative result does not preclude SARS-Cov-2 infection and should not be used as the sole basis for treatment or other patient management decisions. A negative result may occur with  improper specimen collection/handling, submission of specimen other than nasopharyngeal swab, presence of viral mutation(s) within the areas targeted by this assay, and inadequate number of viral copies(<138 copies/mL). A negative result must be combined with clinical observations, patient history, and epidemiological information. The expected result is Negative.  Fact Sheet for Patients:  EntrepreneurPulse.com.au  Fact Sheet  for Healthcare Providers:  IncredibleEmployment.be  This test is no t yet approved or cleared by the [place] and  has been authorized for detection and/or diagnosis of SARS-CoV-2 by FDA under an Emergency Use Authorization (EUA). This EUA will remain  in effect (meaning this test can be used) for the duration of the COVID-19 declaration under Section 564(b)(1) of the Act, 21 U.S.C.section 360bbb-3(b)(1), unless the authorization is terminated  or revoked sooner.       Influenza A by PCR NEGATIVE NEGATIVE Final   Influenza B by PCR NEGATIVE NEGATIVE Final    Comment: (NOTE) The Xpert Xpress SARS-CoV-2/FLU/RSV plus assay is intended as an aid in the diagnosis of influenza from Nasopharyngeal swab specimens and should not be  used as a sole basis for treatment. Nasal washings and aspirates are unacceptable for Xpert Xpress SARS-CoV-2/FLU/RSV testing.  Fact Sheet for Patients: EntrepreneurPulse.com.au  Fact Sheet for Healthcare Providers: IncredibleEmployment.be  This test is not yet approved or cleared by the [place] FDA and has been authorized for detection and/or diagnosis of SARS-CoV-2 by FDA under an Emergency Use Authorization (EUA). This EUA will remain in effect (meaning this test can be used) for the duration of the COVID-19 declaration under Section 564(b)(1) of the Act, 21 U.S.C. section 360bbb-3(b)(1), unless the authorization is terminated or revoked.  Performed at [facility], [place] [address] [place], [place] [address]     Radiology Studies: Overnight EEG with video  Result Date: 08/17/2020 Lora Havens, MD     08/17/2020  2:03 PM Patient Name: Garrell Flagg Lust MRN: [id number] Epilepsy Attending: Lora Havens Referring Physician/Provider: Dr Su Monks Duration: 08/16/2020 0354 to 08/17/2020 0855 Patient history: 60yo M with episodic aphasia, slurred speech,  suspected o be closely associated with his hemodialysis. EEG to evaluate for seizure. Level of alertness: Awake, asleep AEDs during EEG study: None Technical aspects: This EEG study was done with scalp electrodes positioned according to the 10-20 International system of electrode placement. Electrical activity was acquired at a sampling rate of 500Hz  and reviewed with a high frequency filter of 70Hz  and a low frequency filter of 1Hz . EEG data were recorded continuously and digitally stored. Description: The posterior dominant rhythm consists of 7.5 Hz activity of moderate voltage (25-35 uV) seen predominantly in posterior head regions, symmetric and reactive to eye opening and eye closing. Sleep was characterized by vertex waves, sleep spindles (12 to 14 Hz), maximal frontocentral region.  EEG  showed intermittent generalized and maximal left temporal 3 to 6 Hz theta-delta slowing. Hyperventilation and photic stimulation were not performed.   ABNORMALITY - Intermittent slow, generalized and maximal left temporal region IMPRESSION: This study is suggestive of cortical dysfunction arising from left temporal region, nonspecific etiology. Additionally, there iss mild diffuse encephalopathy, nonspecific etiology. No seizures or epileptiform discharges were seen throughout the recording. Priyanka [name]   Scheduled Meds: . carvedilol  25 mg Oral BID WC  . Chlorhexidine Gluconate Cloth  6 each Topical Q0600  . ferric citrate  420 mg Oral TID WC  . insulin aspart  0-6 Units Subcutaneous TID WC  . irbesartan  150 mg Oral Daily  . loratadine  10 mg Oral Daily  . pravastatin  80 mg Oral QPM  . torsemide  100 mg Oral Daily   Continuous Infusions:    LOS: 2 days    Time spent: 9min  Domenic Polite, MD [facility]  08/18/2020, 11:49 AM

## 2020-08-18 NOTE — Progress Notes (Addendum)
Inpatient Diabetes Program Recommendations  AACE/ADA: New Consensus Statement on Inpatient Glycemic Control (2015)  Target Ranges:  Prepandial:   less than 140 mg/dL      Peak postprandial:   less than 180 mg/dL (1-2 hours)      Critically ill patients:  140 - 180 mg/dL   Lab Results  Component Value Date   GLUCAP 80 08/18/2020   HGBA1C 8.0 (H) 06/17/2020    Review of Glycemic Control Results for CUONG, MOORMAN (MRN [id number]) as of 08/18/2020 08:53  Ref. Range 08/17/2020 07:42 08/17/2020 11:08 08/17/2020 16:22 08/17/2020 20:54 08/18/2020 05:26 08/18/2020 06:02 08/18/2020 06:39  Glucose-Capillary Latest Ref Range: 70 - 99 mg/dL 109 (H) 95 96 108 (H) 52 (L) 90 80    Diabetes history: DM Outpatient Diabetes medications: Tresiba 66 units, Rybelsus 14 mg daily Current orders for Inpatient glycemic control:  Novolog 0-6 units tid  Inpatient Diabetes Program Recommendations:    Consult inquiring about CGM. With insurance pt will need Physician to fill out preauthorization form on [employer]. Freestyle Elenor Legato is also not recommended in use for ESRD pts.  Will need close follow up with PCP oupt.  Thank you, Tama Headings RN, MSN, BC-ADM Inpatient Diabetes Coordinator Team Pager [phone] (8a-5p)

## 2020-08-18 NOTE — Procedures (Signed)
Patient Name: Scott Castillo  MRN: [id number]  Epilepsy Attending: Lora Havens  Referring Physician/Provider: Dr Su Monks Duration: 08/17/2020 5284 to 08/18/2020 1324  Patient history: 60yo M with episodic aphasia, slurred speech,  suspected o be closely associated with his hemodialysis. EEG to evaluate for seizure.  Level of alertness: Awake, asleep  AEDs during EEG study: None  Technical aspects: This EEG study was done with scalp electrodes positioned according to the 10-20 International system of electrode placement. Electrical activity was acquired at a sampling rate of 500Hz  and reviewed with a high frequency filter of 70Hz  and a low frequency filter of 1Hz . EEG data were recorded continuously and digitally stored.   Description: The posterior dominant rhythm consists of 7.5 Hz activity of moderate voltage (25-35 uV) seen predominantly in posterior head regions, symmetric and reactive to eye opening and eye closing. Sleep was characterized by vertex waves, sleep spindles (12 to 14 Hz), maximal frontocentral region.  EEG showed intermittent generalized and maximal left temporal 3 to 6 Hz theta-delta slowing. Hyperventilation and photic stimulation were not performed.     Patient event button was pressed on 08/17/2020 at 2028.  Patient reported feeling electric shocklike sensation on the right side of head lasting about 5 seconds.  Concomitant EEG before, during and after the event did not show any EEG changes suggest seizure.  Of note, parts of study were difficult to evaluate due to significant electrode artifact.  ABNORMALITY - Intermittent slow, generalized and maximal left temporal region  IMPRESSION: This study is suggestive of cortical dysfunction arising from left temporal region, nonspecific etiology. Additionally, there iss mild diffuse encephalopathy, nonspecific etiology. No seizures or epileptiform discharges were seen throughout the recording.  Patient event  button was pressed on 08/17/2020 at 2028 during which patient reported feeling electric shocklike sensation on the right side of head without concomitant EEG change and therefore most likely not an epileptic event. Focal seizures may not be seen on scalp EEG.  Therefore clinical correlation is recommended.  Jacoby Zanni [name]

## 2020-08-18 NOTE — Progress Notes (Signed)
[facility] Progress Note   Subjective:    Seen in room. Hypoglycemic episode this am.   Objective Vitals:   08/17/20 0752 08/17/20 1803 08/17/20 2052 08/18/20 0529  BP: (!) 168/79 (!) 159/84 (!) 134/58 139/65  Pulse: 67 71 68 62  Resp:  16 16 16   Temp:  97.8 F (36.6 C) 99.3 F (37.4 C) (!) 97.4 F (36.3 C)  TempSrc:  Oral Oral   SpO2:  94% 95% 95%  Weight:      Height:       Physical Exam General: WDWN male, alert and in NAD Heart: RRR, no murmur, rubs of gallops Lungs: CTA bilaterally without wheezing, rhonchi, or rales Abdomen: Soft, non-tender, non-distended, +BS Extremities: No edema b/l lower extremities Dialysis Access:  LUE AVF + bruit  Additional Objective Labs: Basic Metabolic Panel: Recent Labs  Lab 08/15/20 1034 08/15/20 2023 08/16/20 0405  NA 134* 132* 134*  K 6.2* 3.7 4.3  CL 99 99 96*  CO2 17* 21* 24  GLUCOSE 71 127* 75  BUN 115* 58* 61*  CREATININE 16.60* 10.05* 11.37*  CALCIUM 8.4* 8.5* 8.5*  PHOS  --  7.5*  --    Liver Function Tests: Recent Labs  Lab 08/15/20 2023  ALBUMIN 3.3*   CBC: Recent Labs  Lab 08/15/20 1034 08/15/20 2023 08/16/20 1249  WBC 11.4* 8.9 6.9  NEUTROABS 9.5*  --   --   HGB 11.3* 10.8* 10.0*  HCT 34.0* 31.3* 29.5*  MCV 93.2 88.9 90.2  PLT 226 210 186   CBG: Recent Labs  Lab 08/17/20 1622 08/17/20 2054 08/18/20 0526 08/18/20 0602 08/18/20 0639  GLUCAP 96 108* 52* 90 80    Studies/Results: Overnight EEG with video  Result Date: 08/17/2020 Lora Havens, MD     08/17/2020  2:03 PM Patient Name: Scott Castillo MRN: [id number] Epilepsy Attending: Lora Havens Referring Physician/Provider: Dr Su Monks Duration: 08/16/2020 0855 to 08/17/2020 0855 Patient history: 60yo M with episodic aphasia, slurred speech,  suspected o be closely associated with his hemodialysis. EEG to evaluate for seizure. Level of alertness: Awake, asleep AEDs during EEG study: None Technical aspects: This EEG  study was done with scalp electrodes positioned according to the 10-20 International system of electrode placement. Electrical activity was acquired at a sampling rate of 500Hz  and reviewed with a high frequency filter of 70Hz  and a low frequency filter of 1Hz . EEG data were recorded continuously and digitally stored. Description: The posterior dominant rhythm consists of 7.5 Hz activity of moderate voltage (25-35 uV) seen predominantly in posterior head regions, symmetric and reactive to eye opening and eye closing. Sleep was characterized by vertex waves, sleep spindles (12 to 14 Hz), maximal frontocentral region.  EEG showed intermittent generalized and maximal left temporal 3 to 6 Hz theta-delta slowing. Hyperventilation and photic stimulation were not performed.   ABNORMALITY - Intermittent slow, generalized and maximal left temporal region IMPRESSION: This study is suggestive of cortical dysfunction arising from left temporal region, nonspecific etiology. Additionally, there iss mild diffuse encephalopathy, nonspecific etiology. No seizures or epileptiform discharges were seen throughout the recording. Priyanka [name]   Medications:  . carvedilol  25 mg Oral BID WC  . Chlorhexidine Gluconate Cloth  6 each Topical Q0600  . ferric citrate  420 mg Oral TID WC  . insulin aspart  0-6 Units Subcutaneous TID WC  . irbesartan  150 mg Oral Daily  . loratadine  10 mg Oral Daily  . pravastatin  80 mg Oral QPM  . torsemide  100 mg Oral Daily    [facility] Optiflux 200NRe, 4:15hr, BFR 400, DFR 500, EDW 111.5kg, 2K/2Ca, UF profile 2, AVF 15g, no heparin  Assessment/Plan:  1. Missed dialysis/ESRD:Dialyzes on MWF schedule. Only had 2.5 hours of dialysis the week prior to admission due to issues with pinched nerve in his neck. K+, BUN and Cr elevated on admission, improved after HD Friday and Saturday. Next HD will be Monday.  2. Episodic  aphasia/pinched cervical nerve: Pt has episodes of aphasia for several months which he associates with pain in his neck. Has been evaluated outpatient, no clear metabolic cause found for these episodes and they are now happening on and off HD. Seen by neuro con 4/28, see note from [facility]. No "episodes" during HD yesterday. Neuro consulted here. On continuous EEG monitor.  3. Hypertension/volume:Hypertensive on admisison, CXR with mild vascular congestion but no shortness of breath. BP now improved. UF to EDW today as tolerated.  4. Hypoglycemia: Recurrent hypoglycemia, basal insulin on hold.  5. Anemia:Hgb 10.0, if decreases further start ESA. 6. Metabolic bone disease:Calcium controlled. Not on VDRA. Phos elevated, continue phosphorus binders.    Lynnda Child PA-C [facility] 08/18/2020,9:12 AM

## 2020-08-18 NOTE — Progress Notes (Signed)
Patient's blood sugar is 52. Low blood sugar corrected per protocol. Will recheck.

## 2020-08-19 DIAGNOSIS — N186 End stage renal disease: Secondary | ICD-10-CM | POA: Diagnosis not present

## 2020-08-19 DIAGNOSIS — E1169 Type 2 diabetes mellitus with other specified complication: Secondary | ICD-10-CM | POA: Diagnosis not present

## 2020-08-19 DIAGNOSIS — E669 Obesity, unspecified: Secondary | ICD-10-CM

## 2020-08-19 DIAGNOSIS — E162 Hypoglycemia, unspecified: Secondary | ICD-10-CM | POA: Diagnosis not present

## 2020-08-19 DIAGNOSIS — R4701 Aphasia: Secondary | ICD-10-CM | POA: Diagnosis not present

## 2020-08-19 LAB — BASIC METABOLIC PANEL
Anion gap: 11 (ref 5–15)
BUN: 36 mg/dL — ABNORMAL HIGH (ref 6–20)
CO2: 25 mmol/L (ref 22–32)
Calcium: 8.7 mg/dL — ABNORMAL LOW (ref 8.9–10.3)
Chloride: 99 mmol/L (ref 98–111)
Creatinine, Ser: 7.27 mg/dL — ABNORMAL HIGH (ref 0.61–1.24)
GFR, Estimated: 8 mL/min — ABNORMAL LOW (ref >60)
Glucose, Bld: 87 mg/dL (ref 70–99)
Potassium: 4.4 mmol/L (ref 3.5–5.1)
Sodium: 135 mmol/L (ref 135–145)

## 2020-08-19 LAB — CBC
HCT: 30.5 % — ABNORMAL LOW (ref 39.0–52.0)
Hemoglobin: 10.2 g/dL — ABNORMAL LOW (ref 13.0–17.0)
MCH: 30.3 pg (ref 26.0–34.0)
MCHC: 33.4 g/dL (ref 30.0–36.0)
MCV: 90.5 fL (ref 80.0–100.0)
Platelets: 168 10*3/uL (ref 150–400)
RBC: 3.37 MIL/uL — ABNORMAL LOW (ref 4.22–5.81)
RDW: 13 % (ref 11.5–15.5)
WBC: 6.3 10*3/uL (ref 4.0–10.5)
nRBC: 0 % (ref 0.0–0.2)

## 2020-08-19 LAB — HEMOGLOBIN A1C
Hgb A1c MFr Bld: 6.4 % — ABNORMAL HIGH (ref 4.8–5.6)
Mean Plasma Glucose: 136.98 mg/dL

## 2020-08-19 LAB — GLUCOSE, CAPILLARY
Glucose-Capillary: 79 mg/dL (ref 70–99)
Glucose-Capillary: 111 mg/dL — ABNORMAL HIGH (ref 70–99)

## 2020-08-19 MED ORDER — ACETAMINOPHEN 325 MG PO TABS: 650.0000 mg | ORAL_TABLET | Freq: Four times a day (QID) | ORAL | Status: DC | PRN

## 2020-08-19 NOTE — Progress Notes (Signed)
[facility] Progress Note   Subjective:    Completed dialysis yesterday net UF 3L. No "episodes" during dialysis Feels well this am. No cp,sob.  May discharge today after EEG complete    Objective Vitals:   08/18/20 1818 08/18/20 2000 08/19/20 0406 08/19/20 0921  BP: (!) 177/101 (!) 152/93 (!) 173/95 (!) 171/93  Pulse: 79 82 67 67  Resp: 19 18 17 18   Temp: 98.6 F (37 C) 98.3 F (36.8 C) 98.4 F (36.9 C) 98 F (36.7 C)  TempSrc:  Oral Oral Oral  SpO2: 93%  95% 99%  Weight:      Height:       Physical Exam General: WDWN male, alert and in NAD Heart: RRR, no murmur, rubs of gallops Lungs: CTA bilaterally without wheezing, rhonchi, or rales Abdomen: Soft, non-tender, non-distended, +BS Extremities: No edema b/l lower extremities Dialysis Access:  LUE AVF + bruit  Additional Objective Labs: Basic Metabolic Panel: Recent Labs  Lab 08/15/20 2023 08/16/20 0405 08/18/20 1116 08/19/20 0418  NA 132* 134* 132* 135  K 3.7 4.3 5.1 4.4  CL 99 96* 97* 99  CO2 21* 24 23 25   GLUCOSE 127* 75 93 87  BUN 58* 61* 64* 36*  CREATININE 10.05* 11.37* 10.71* 7.27*  CALCIUM 8.5* 8.5* 8.8* 8.7*  PHOS 7.5*  --   --   --    Liver Function Tests: Recent Labs  Lab 08/15/20 2023  ALBUMIN 3.3*   CBC: Recent Labs  Lab 08/15/20 1034 08/15/20 2023 08/16/20 1249 08/18/20 1254 08/19/20 0418  WBC 11.4* 8.9 6.9 6.5 6.3  NEUTROABS 9.5*  --   --   --   --   HGB 11.3* 10.8* 10.0* 10.3* 10.2*  HCT 34.0* 31.3* 29.5* 30.9* 30.5*  MCV 93.2 88.9 90.2 90.4 90.5  PLT 226 210 186 177 168   CBG: Recent Labs  Lab 08/18/20 0639 08/18/20 1109 08/18/20 1743 08/18/20 2202 08/19/20 0715  GLUCAP 80 91 167* 115* 79    Studies/Results: No results found. Medications:  . carvedilol  25 mg Oral BID WC  . Chlorhexidine Gluconate Cloth  6 each Topical Q0600  . ferric citrate  420 mg Oral TID WC  . insulin aspart  0-6 Units Subcutaneous TID WC  . irbesartan  150 mg Oral Daily   . loratadine  10 mg Oral Daily  . pravastatin  80 mg Oral QPM  . torsemide  100 mg Oral Q T,Th,S,Su    [facility] Optiflux 200NRe, 4:15hr, BFR 400, DFR 500, EDW 111.5kg, 2K/2Ca, UF profile 2, AVF 15g, no heparin  Assessment/Plan:  1. Missed dialysis/ESRD:Dialyzes on MWF schedule. Only had 2.5 hours of dialysis the week prior to admission due to issues with pinched nerve in his neck. K+, BUN and Cr elevated on admission, improved after HD Friday and Saturday. Back on schedule this week. Next HD 5/4.  2. Episodic aphasia/pinched cervical nerve: Pt has episodes of aphasia for several months which he associates with pain in his neck. Has been evaluated outpatient, no clear metabolic cause found for these episodes and they are now happening on and off HD. Seen by neuro con 4/28, see note from [facility]. No "episodes" during HD yesterday. Neuro consulted here. On continuous EEG monitor. Nonspecific findings.  3. Hypertension/volume:Hypertensive on admisison, CXR with mild vascular congestion but no shortness of breath. BP variable during admission.  UF to EDW as tolerated.  4. Hypoglycemia: Recurrent hypoglycemia, basal insulin on hold. Could  also be contributing. He will make sure to eat before dialysis.  5. Anemia:Hgb 10.2, if decreases further start ESA. 6. Metabolic bone disease:Calcium controlled. Not on VDRA. Phos elevated, continue phosphorus binders.    Lynnda Child PA-C [facility] 08/19/2020,9:29 AM

## 2020-08-19 NOTE — Discharge Summary (Signed)
Physician Discharge Summary  Banyan Goodchild Papadopoulos IRS:[id number] DOB: [birth year] DOA: 08/15/2020  PCP: Algis Greenhouse, MD  Admit date: 08/15/2020 Discharge date: 08/19/2020  Time spent: 35 minutes  Recommendations for Outpatient Follow-up:  1. PCP in 1 week, recommend continuous glucose monitoring, endocrinology referral 2. Nephrology Dr. Johnney Ou in 2 weeks   Discharge Diagnoses:  Active Problems: Hyperkalemia Fluid overload Hypoglycemia ESRD on hemodialysis Cervical radiculopathy Episodic aphasia and slurred speech (HCC)   Diabetes mellitus type 2 in obese (HCC)   Hyperkalemia   Aphasia   Hyperkalemia, diminished renal excretion   Discharge Condition: Stable Diet recommendation: Renal carb modified Filed Weights   08/16/20 1725 08/18/20 1230 08/18/20 1645  Weight: 114.5 kg 116.9 kg 113.9 kg    History of present illness:  60 year old male with history of ESRD on hemodialysis MWF, type 2 diabetes mellitus on insulin, polyneuropathy, hypertension was brought to the ED after missed hemodialysis.  When EMS arrived he was noted to have slurred speech, CBG was in the 50s treated with D10 followed by improvement.  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 Bland Span few days ago, etiology unclear, felt that he could have low-level metabolic encephalopathy that flares especially during blood pressure  changes on dialysis, recent episodes of hypoglycemia could also be contributing -Also had CTA head and neck on 06/16/2020 which is negative for any large vessel occlusion -Has had moderate BP fluctuations as well -He also suspected a C7 radiculopathy -Dr. Johnney Ou discussed with neurology, had continuous EEG monitoring which was negative for epileptiform activity, per neurology Dr. Hortense Ramal "These episodes of speech disturbance are not epileptic. Most likely some disequilibrium/ vascular issue.  She recommended to adjust dialysis settings to minimize [place] fluid shifts, make sure to keep legs up and head laid back during dialysis and maybe if possible take anti hypertensives after dialysis rather than before. Some of his episodes could also be related to hypoglycemia.  Lastly. He has cervical stenosis he said and maybe that's causing the shock like sensation in his face and head.  Patient did not have any such episodes in the hospital, discharged home today in a stable condition off insulin  Hypoglycemia Type 2 diabetes mellitus -Blood sugars in the 40s and 50s on admission, required D10 drip on admission, subsequently discontinued, and then had an episode of hypoglycemia 2 consecutive days in the morning -He is on Tresiba 66 units daily at discharge which was discontinued, hemoglobin A1c still pending at the time of discharge, CBGs have been subsequently stable in the low 90-140 range, semaglutide continued -Hence discharged home without insulin, recommended close follow-up with PCP and consideration of continuous glucose monitoring, as well as endocrinology follow-up in [place]  Cervical radiculopathy -Follow-up with neurology, physical therapy  Hypertension -Blood pressure on the higher side, currently on irbesartan and torsemide  Anemia of chronic disease  Discharge Exam: Vitals:   08/19/20 0406 08/19/20 0921  BP: (!) 173/95 (!) 171/93  Pulse: 67 67  Resp: 17 18  Temp: 98.4 F (36.9 C)  98 F (36.7 C)  SpO2: 95% 99%    General: AAOx3 Cardiovascular: S1S2/RRR Respiratory: CTAB  Discharge Instructions   Discharge Instructions    Discharge instructions   Complete by: As directed    Renal Diabetic   Increase activity slowly   Complete by: As directed      Allergies as of 08/19/2020      Reactions   Heparin Other (See Comments)   Per pt he has episodes where he can't speak   Pioglitazone Swelling   Site of swelling not recalled by patient   Adhesive [tape] Other (See Comments)   Causes redness, pt prefers paper tape    Latex Rash   Redness, also      Medication List    STOP taking these medications   insulin degludec 200 UNIT/ML FlexTouch Pen Commonly known as: Tyler Aas FlexTouch   tiZANidine 4 MG tablet Commonly known as: ZANAFLEX     TAKE these medications   acetaminophen 325 MG tablet Commonly known as: TYLENOL Take 2 tablets (650 mg total) by mouth every 6 (six) hours as needed for mild pain (or Fever >/= 101).   ascorbic acid 1000 MG tablet Commonly known as: VITAMIN C Take 1,000 mg by mouth daily.   BD ULTRA-FINE PEN NEEDLES 29G X 12.7MM Misc Generic drug: Insulin Pen Needle 1 each by Other route as directed.   carvedilol 25 MG tablet Commonly known as: COREG Take 25 mg by mouth 2 (two) times daily with a meal.   cyanocobalamin 1000 MCG tablet Take 1,000 mcg by mouth daily.   ferric citrate 1 GM 210 MG(Fe) tablet Commonly known as: AURYXIA Take 210-420 mg by mouth See admin instructions. Take 2 tablets (420 mg) by mouth 3 times daily with meals and 1 tablet (210 mg) with snacks   irbesartan 150 MG tablet Commonly known as: AVAPRO Take 150 mg by mouth daily.   pravastatin 80 MG tablet Commonly known as: PRAVACHOL Take 80 mg by mouth every evening.   Rybelsus 14 MG Tabs Generic drug: Semaglutide Take 14 mg by mouth daily.   torsemide 100 MG tablet Commonly known as: DEMADEX Take 100 mg by mouth daily.      Allergies   Allergen Reactions  . Heparin Other (See Comments)    Per pt he has episodes where he can't speak  . Pioglitazone Swelling    Site of swelling not recalled by patient  . Adhesive [Tape] Other (See Comments)    Causes redness, pt prefers paper tape   . Latex Rash    Redness, also      The results of significant diagnostics from this hospitalization (including imaging, microbiology, ancillary and laboratory) are listed below for reference.    Significant Diagnostic Studies: DG Chest Port 1 View  Result Date: 08/15/2020 CLINICAL DATA:  Short of breath.  Dialysis patient. EXAM: PORTABLE CHEST 1 VIEW COMPARISON:  05/02/2020 FINDINGS: Heart size upper normal. Mild vascular congestion without edema or effusion. Negative for infiltrate. IMPRESSION: Mild vascular congestion without edema Electronically Signed   By: Franchot Gallo M.D.   On: 08/15/2020 11:02   Overnight EEG with video  Result Date: 08/17/2020 Lora Havens, MD     08/17/2020  2:03 PM Patient Name: Spyros Winch Faith MRN: [id number] Epilepsy Attending: Lora Havens Referring Physician/Provider: Dr Su Monks Duration: 08/16/2020 8756 to 08/17/2020 0855 Patient history: 60yo M with episodic aphasia, slurred speech,  suspected o be  closely associated with his hemodialysis. EEG to evaluate for seizure. Level of alertness: Awake, asleep AEDs during EEG study: None Technical aspects: This EEG study was done with scalp electrodes positioned according to the 10-20 International system of electrode placement. Electrical activity was acquired at a sampling rate of 500Hz  and reviewed with a high frequency filter of 70Hz  and a low frequency filter of 1Hz . EEG data were recorded continuously and digitally stored. Description: The posterior dominant rhythm consists of 7.5 Hz activity of moderate voltage (25-35 uV) seen predominantly in posterior head regions, symmetric and reactive to eye opening and eye closing. Sleep was characterized by vertex  waves, sleep spindles (12 to 14 Hz), maximal frontocentral region.  EEG showed intermittent generalized and maximal left temporal 3 to 6 Hz theta-delta slowing. Hyperventilation and photic stimulation were not performed.   ABNORMALITY - Intermittent slow, generalized and maximal left temporal region IMPRESSION: This study is suggestive of cortical dysfunction arising from left temporal region, nonspecific etiology. Additionally, there iss mild diffuse encephalopathy, nonspecific etiology. No seizures or epileptiform discharges were seen throughout the recording. Lora Havens    Microbiology: Recent Results (from the past 240 hour(s))  Resp Panel by RT-PCR (Flu A&B, Covid) Nasopharyngeal Swab     Status: None   Collection Time: 08/15/20  1:22 PM   Specimen: Nasopharyngeal Swab; Nasopharyngeal(NP) swabs in vial transport medium  Result Value Ref Range Status   SARS Coronavirus 2 by RT PCR NEGATIVE NEGATIVE Final    Comment: (NOTE) SARS-CoV-2 target nucleic acids are NOT DETECTED.  The SARS-CoV-2 RNA is generally detectable in upper respiratory specimens during the acute phase of infection. The lowest concentration of SARS-CoV-2 viral copies this assay can detect is 138 copies/mL. A negative result does not preclude SARS-Cov-2 infection and should not be used as the sole basis for treatment or other patient management decisions. A negative result may occur with  improper specimen collection/handling, submission of specimen other than nasopharyngeal swab, presence of viral mutation(s) within the areas targeted by this assay, and inadequate number of viral copies(<138 copies/mL). A negative result must be combined with clinical observations, patient history, and epidemiological information. The expected result is Negative.  Fact Sheet for Patients:  EntrepreneurPulse.com.au  Fact Sheet for Healthcare Providers:  IncredibleEmployment.be  This test is  no t yet approved or cleared by the [place] FDA and  has been authorized for detection and/or diagnosis of SARS-CoV-2 by FDA under an Emergency Use Authorization (EUA). This EUA will remain  in effect (meaning this test can be used) for the duration of the COVID-19 declaration under Section 564(b)(1) of the Act, 21 U.S.C.section 360bbb-3(b)(1), unless the authorization is terminated  or revoked sooner.       Influenza A by PCR NEGATIVE NEGATIVE Final   Influenza B by PCR NEGATIVE NEGATIVE Final    Comment: (NOTE) The Xpert Xpress SARS-CoV-2/FLU/RSV plus assay is intended as an aid in the diagnosis of influenza from Nasopharyngeal swab specimens and should not be used as a sole basis for treatment. Nasal washings and aspirates are unacceptable for Xpert Xpress SARS-CoV-2/FLU/RSV testing.  Fact Sheet for Patients: EntrepreneurPulse.com.au  Fact Sheet for Healthcare Providers: IncredibleEmployment.be  This test is not yet approved or cleared by the [place] FDA and has been authorized for detection and/or diagnosis of SARS-CoV-2 by FDA under an Emergency Use Authorization (EUA). This EUA will remain in effect (meaning this test can be used) for the duration of the COVID-19 declaration under Section 564(b)(1) of the  Act, 21 U.S.C. section 360bbb-3(b)(1), unless the authorization is terminated or revoked.  Performed at [facility], [place] [address], [place], [place] [address]      Labs: Basic Metabolic Panel: Recent Labs  Lab 08/15/20 1034 08/15/20 2023 08/16/20 0405 08/18/20 1116 08/19/20 0418  NA 134* 132* 134* 132* 135  K 6.2* 3.7 4.3 5.1 4.4  CL 99 99 96* 97* 99  CO2 17* 21* 24 23 25   GLUCOSE 71 127* 75 93 87  BUN 115* 58* 61* 64* 36*  CREATININE 16.60* 10.05* 11.37* 10.71* 7.27*  CALCIUM 8.4* 8.5* 8.5* 8.8* 8.7*  PHOS  --  7.5*  --   --   --    Liver Function Tests: Recent Labs  Lab 08/15/20 2023  ALBUMIN  3.3*   No results for input(s): LIPASE, AMYLASE in the last 168 hours. Recent Labs  Lab 08/15/20 1322 08/16/20 0405  AMMONIA 32 24   CBC: Recent Labs  Lab 08/15/20 1034 08/15/20 2023 08/16/20 1249 08/18/20 1254 08/19/20 0418  WBC 11.4* 8.9 6.9 6.5 6.3  NEUTROABS 9.5*  --   --   --   --   HGB 11.3* 10.8* 10.0* 10.3* 10.2*  HCT 34.0* 31.3* 29.5* 30.9* 30.5*  MCV 93.2 88.9 90.2 90.4 90.5  PLT 226 210 186 177 168   Cardiac Enzymes: No results for input(s): CKTOTAL, CKMB, CKMBINDEX, TROPONINI in the last 168 hours. BNP: BNP (last 3 results) No results for input(s): BNP in the last 8760 hours.  ProBNP (last 3 results) No results for input(s): PROBNP in the last 8760 hours.  CBG: Recent Labs  Lab 08/18/20 1109 08/18/20 1743 08/18/20 2202 08/19/20 0715 08/19/20 1138  GLUCAP 91 167* 115* 79 111*    Signed:  Domenic Polite MD.  [facility] 08/19/2020, 2:04 PM

## 2020-08-19 NOTE — Plan of Care (Signed)
  Problem: Education: Goal: Knowledge of General Education information will improve Description: Including pain rating scale, medication(s)/side effects and non-pharmacologic comfort measures Outcome: Progressing   Problem: Activity: Goal: Risk for activity intolerance will decrease Outcome: Progressing   Problem: Coping: Goal: Level of anxiety will decrease Outcome: Progressing   

## 2020-08-19 NOTE — Progress Notes (Signed)
DISCHARGE NOTE HOME Scott Castillo to be discharged Home per MD order. Discussed prescriptions and follow up appointments with the patient. Prescriptions given to patient; medication list explained in detail. Patient verbalized understanding.  Skin clean, dry and intact without evidence of skin break down, no evidence of skin tears noted. IV catheter discontinued intact. Site without signs and symptoms of complications. Dressing and pressure applied. Pt denies pain at the site currently. No complaints noted.  Patient free of lines, drains, and wounds.   An After Visit Summary (AVS) was printed and given to the patient. Patient escorted via wheelchair, and discharged home via private auto.  Berneta Levins, RN

## 2020-08-19 NOTE — Consult Note (Addendum)
Neurology Consultation Reason for Consult: Transient episodes of confusion Referring Physician: Dr. Domenic Polite  CC: Missed dialysis and not feeling well  History is obtained from: Patient, chart review as summarized below  HPI: Scott Castillo is a 60 y.o. male with medical history of hypertension, diabetes, end-stage renal disease on dialysis who presented on 08/15/2020 due to not feeling well.  Patient states he woke up in the morning and did not feel well.  Therefore went to get some juice from the fridge.  He states he felt as if his body is floating in the air and he is watching himself from above.  States he did not check his blood glucose but thinks he may be hypoglycemic.  EMS was called and blood glucose was 58.  Per review of history, patient had slurred speech which improved after administering D10.  He was admitted to medicine team.  Patient reported he has had change in his insulin regimen and was taken off of long-acting insulin about a month ago.  States his insulin usually is between 200-300.  During this hospitalization his blood sugar has been as low as 40.  On Friday during dialysis, he had another episode during which he had trouble speaking.  Therefore neurology was consulted for further management.  Patient states he has been having episodes where he is aware of his surroundings but unable to speak since February 2022.  He states all of these episodes happen in the middle of dialysis.  The last for about 1 to 2 hours after which he is back to baseline.  He was seen by Dr. 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  He had a routine EEG which showed significant slowing without seizure activity.   Patient also reports pinched nerve in his neck for which she is being followed by neurology at [place].   ROS: All other systems reviewed and negative except as noted in the HPI.   Past Medical  History:  Diagnosis Date  . C7 radiculopathy   . Carpal tunnel syndrome, bilateral    by EMG  R>L  . 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 H/O pancreatic cancer   . HLD (hyperlipidemia)   . HTN (hypertension)   . Lumbar radiculopathy, chronic    hemilamectomy '94  . Venous stasis dermatitis of both lower extremities     Family History  Problem Relation Age of Onset  . Edema Mother   . Heart attack Father   . Heart failure Father   . Heart attack Brother   . Heart attack Brother     Social History:  reports that he has never smoked. He has never used smokeless tobacco. He reports that he does not drink alcohol and does not use drugs.   Medications Prior to Admission  Medication Sig Dispense Refill Last Dose  . ascorbic acid (VITAMIN C) 1000 MG tablet Take 1,000 mg by mouth daily.   08/14/2020 at Unknown time  . carvedilol (COREG) 25 MG tablet Take 25 mg by mouth 2 (two) times daily with a meal.    08/14/2020 at 2030  . cyanocobalamin 1000 MCG tablet Take 1,000 mcg by mouth daily.   08/14/2020 at Unknown time  . ferric citrate (AURYXIA) 1 GM 210 MG(Fe) tablet Take 210-420 mg by mouth See admin instructions. Take 2 tablets (420 mg) by mouth 3 times daily with meals and 1 tablet (  210 mg) with snacks   08/14/2020 at Unknown time  . Insulin Degludec (TRESIBA FLEXTOUCH) 200 UNIT/ML SOPN Inject 10 Units into the skin every morning. (Patient taking differently: Inject 66 Units into the skin daily.) 1 pen 11 08/15/2020 at Unknown time  . Insulin Pen Needle (BD ULTRA-FINE PEN NEEDLES) 29G X 12.7MM MISC 1 each by Other route as directed.     . irbesartan (AVAPRO) 150 MG tablet Take 150 mg by mouth daily.   08/15/2020 at Unknown time  . pravastatin (PRAVACHOL) 80 MG tablet Take 80 mg by mouth every evening.    08/14/2020 at Unknown time  . Semaglutide (RYBELSUS) 14 MG TABS Take 14 mg by mouth daily.   08/15/2020 at Unknown time  . tiZANidine  (ZANAFLEX) 4 MG tablet Take 4 mg by mouth every 6 (six) hours as needed for muscle spasms.   unknown  . torsemide (DEMADEX) 100 MG tablet Take 100 mg by mouth daily.   08/14/2020 at Unknown time      Exam: Current vital signs: BP (!) 171/93 (BP Location: Right Arm)   Pulse 67   Temp 98 F (36.7 C) (Oral)   Resp 18   Ht 6' (1.829 m)   Wt 113.9 kg   SpO2 99%   BMI 34.06 kg/m  Vital signs in last 24 hours: Temp:  [98 F (36.7 C)-98.6 F (37 C)] 98 F (36.7 C) (05/03 0921) Pulse Rate:  [67-82] 67 (05/03 0921) Resp:  [15-19] 18 (05/03 0921) BP: (152-193)/(81-106) 171/93 (05/03 0921) SpO2:  [93 %-99 %] 99 % (05/03 0921) Weight:  [113.9 kg-116.9 kg] 113.9 kg (05/02 1645)   Physical Exam  Constitutional: Appears well-developed and well-nourished.  Psych: Affect appropriate to situation Eyes: No scleral injection HENT: No OP obstrucion Head: Normocephalic.  Cardiovascular: Normal rate and regular rhythm.  Respiratory: Effort normal, non-labored breathing GI: Soft.  No distension. There is no tenderness.  Skin: Warm, discoloration of bilateral lower extremities Neuro: AOx3, cranial nerves 2-12 grossly intact, 5/5 in bilateral upper extremities, 4/5 in bilateral lower extremities, FTN intact   I have reviewed labs in epic and the results pertinent to this consultation are: CBC:  Recent Labs  Lab 08/15/20 1034 08/15/20 2023 08/18/20 1254 08/19/20 0418  WBC 11.4*   < > 6.5 6.3  NEUTROABS 9.5*  --   --   --   HGB 11.3*   < > 10.3* 10.2*  HCT 34.0*   < > 30.9* 30.5*  MCV 93.2   < > 90.4 90.5  PLT 226   < > 177 168   < > = values in this interval not displayed.    Basic Metabolic Panel:  Lab Results  Component Value Date   NA 135 08/19/2020   K 4.4 08/19/2020   CO2 25 08/19/2020   GLUCOSE 87 08/19/2020   BUN 36 (H) 08/19/2020   CREATININE 7.27 (H) 08/19/2020   CALCIUM 8.7 (L) 08/19/2020   GFRNONAA 8 (L) 08/19/2020   GFRAA 14 (L) 07/06/2016   Lipid Panel: No  results found for: LDLCALC HgbA1c:  Lab Results  Component Value Date   HGBA1C 8.0 (H) 06/17/2020   Urine Drug Screen:     Component Value Date/Time   LABOPIA NONE DETECTED 06/16/2020 [place] DETECTED 06/16/2020 1244   LABBENZ NONE DETECTED 06/16/2020 1244   AMPHETMU NONE DETECTED 06/16/2020 [place] DETECTED 06/16/2020 1244   [place] DETECTED 06/16/2020 1244    Alcohol Level  Component Value Date/Time   [facility] <10 06/16/2020 1359     I have reviewed the images obtained CT head without contrast 06/17/2019: Small right insular hypodensity may reflect age indeterminate lacunar insult versus prominent sulcus. Motion degraded exam. Mild cerebral atrophy.  CTA head and neck 06/16/2020: Common carotid, internal carotid and vertebral arteries patent within the neck without hemodynamically significant stenosis. Mild atherosclerotic disease within the carotid systems as described.  No intracranial large vessel occlusion or proximal high-grade arterial stenosis. Nonstenotic calcified plaque within the intracranial internal carotid arteries and V4 vertebral arteries bilaterally.  MRI brain without contrast 06/17/2020: No acute infarction, hemorrhage, or mass. Stable parenchymal volume loss greater than expected for age.   ASSESSMENT/PLAN: 60 year old male with history of diabetes, end-stage renal disease on dialysis. Patient also reported episodes of speech disturbance during dialysis.  Transient speech disturbance Dysautonomia -LTM EEG showed cortical dysfunction arising from left temporal region nonspecific etiology as well as mild diffuse encephalopathy.  No seizures or epileptiform discharges were seen throughout the study.  Of note, no episodes of speech disturbance were captured during the study. -MRI brain did not show any evidence of stroke - CTA head and neck did not show any evidence of significant vascular stenosis  Recommendations: -These episodes  only happened during dialysis.  With normal EEG for 48 hours, it is most likely that these episodes are related to dialysis disequilibrium syndrome, dysautonomia, hypoperfusion worsened during dialysis.  Will discuss with patient nephrology team to adjust dialysis settings to minimize large fluid shifts during dialysis.  Also discussed with patient to lay flat and keep legs raised during dialysis. -We will also discuss with nephrologist if we can allow patient to take antihypertensives after his dialysis sessions -Some of the episodes might also be related to his wide fluctuations in blood glucose levels.  Discussed with patient to work with his endocrinologist to minimize blood glucose fluctuations - Would recommend further work-up by PCP for dysautonomia, consider wearing compression stockings -Patient does not need to be on antiseizure medications -We captured episodes during LTM EEG where patient reported shock-like sensation on the right side of head.  These episodes are  likely related to cervical compressive radiculopathy and are not epileptic. -Discussed seizure precautions including do not drive -Follow-up with primary neurologist in 6 to 10 weeks.   Thank you for allowing [place] to participate in the care of this patient. If you have any further questions, please contact  me or neurohospitalist.    I have spent a total of  120  minutes with the patient reviewing hospital notes,  test results, labs and examining the patient as well as establishing an assessment and plan that was discussed personally with the patient.  > 50% of time was spent in direct patient care.    Zeb Comfort Epilepsy [facility]

## 2020-08-19 NOTE — Progress Notes (Signed)
LTM EEG discontinued - no skin breakdown at unhook.   

## 2020-08-19 NOTE — Procedures (Signed)
Patient Name:Scott Castillo [id number]:[id number] Epilepsy Attending:Freedom Peddy [name] Referring Physician/Provider:Dr Su Monks Duration:08/19/2020 2248 to 08/19/2020 1232  Patient history:59yo M with episodic aphasia, slurred speech, suspected o be closely associated with his hemodialysis. EEG to evaluate for seizure.  Level of alertness:Awake, asleep  AEDs during EEG study:None  Technical aspects: This EEG study was done with scalp electrodes positioned according to the 10-20 International system of electrode placement. Electrical activity was acquired at a sampling rate of 500Hz  and reviewed with a high frequency filter of 70Hz  and a low frequency filter of 1Hz . EEG data were recorded continuously and digitally stored.   Description: The posterior dominant rhythm consists of7.5Hz  activity of moderate voltage (25-35 uV) seen predominantly in posterior head regions, symmetric and reactive to eye opening and eye closing. Sleep was characterized by vertex waves, sleep spindles (12 to 14 Hz), maximal frontocentral region. EEG showed intermittentgeneralized and maximal left temporal3 to 6 Hz theta-delta slowing. Hyperventilation and photic stimulation were not performed.  Of note, parts of study were difficult to interpret due to electrode artifact.  ABNORMALITY - Intermittent slow, generalizedand maximal left temporal region  IMPRESSION: This technically difficult study issuggestive of cortical dysfunction arising from left temporalregion, nonspecific etiology. Additionally, there ismild diffuse encephalopathy, nonspecific etiology.No seizures or epileptiform discharges were seen throughout the recording.  Bharath Bernstein [name]

## 2020-08-19 NOTE — Procedures (Signed)
Patient Name:Scott Castillo BLT:[id number] Epilepsy Attending:Makylah Bossard [name] Referring Physician/Provider:Dr Su Monks Duration:08/18/2020 0076 to 08/19/2020 2263  Patient history:59yo M with episodic aphasia, slurred speech, suspected o be closely associated with his hemodialysis. EEG to evaluate for seizure.  Level of alertness:Awake, asleep  AEDs during EEG study:None  Technical aspects: This EEG study was done with scalp electrodes positioned according to the 10-20 International system of electrode placement. Electrical activity was acquired at a sampling rate of 500Hz  and reviewed with a high frequency filter of 70Hz  and a low frequency filter of 1Hz . EEG data were recorded continuously and digitally stored.   Description: The posterior dominant rhythm consists of7.5Hz  activity of moderate voltage (25-35 uV) seen predominantly in posterior head regions, symmetric and reactive to eye opening and eye closing. Sleep was characterized by vertex waves, sleep spindles (12 to 14 Hz), maximal frontocentral region. EEG showed intermittentgeneralized and maximal left temporal3 to 6 Hz theta-delta slowing. Hyperventilation and photic stimulation were not performed.   Patient event button was pressed on 08/18/2020 at [place], [place], 2029, 2250, 2256 and 2306, 08/19/2020 at [place], [place], [place], [place] and 0348. Patient reported feeling electric shock-like sensation on the right side of head. Concomitant EEG before, during and after the event did not show any EEG changes suggest seizure.  ABNORMALITY - Intermittent slow, generalizedand maximal left temporal region  IMPRESSION: This study issuggestive of cortical dysfunction arising from left temporalregion, nonspecific etiology. Additionally, there ismild diffuse encephalopathy, nonspecific etiology.No seizures or epileptiform discharges were seen throughout the recording.  Patient event button was pressed on 08/18/2020 and  08/19/2020 multiple times as described above during which patient reported feeling electric shocklike sensation on the right side of head without concomitant EEG change and therefore most likely not an epileptic event. Focal seizures may not be seen on scalp EEG.  Therefore clinical correlation is recommended.  Sevannah Madia [name]

## 2020-08-31 ENCOUNTER — Inpatient Hospital Stay (HOSPITAL_COMMUNITY)
Admission: EM | Admit: 2020-08-31 | Discharge: 2020-09-08 | DRG: 091 | Disposition: A | Discharge status: Home Health | Payer: Medicare Other | Attending: Internal Medicine | Admitting: Internal Medicine

## 2020-08-31 ENCOUNTER — Emergency Department (HOSPITAL_COMMUNITY): Payer: Medicare Other

## 2020-08-31 ENCOUNTER — Encounter (HOSPITAL_COMMUNITY): Payer: Self-pay

## 2020-08-31 DIAGNOSIS — G9341 Metabolic encephalopathy: Secondary | ICD-10-CM | POA: Diagnosis present

## 2020-08-31 DIAGNOSIS — E878 Other disorders of electrolyte and fluid balance, not elsewhere classified: Secondary | ICD-10-CM | POA: Diagnosis not present

## 2020-08-31 DIAGNOSIS — N19 Unspecified kidney failure: Secondary | ICD-10-CM

## 2020-08-31 DIAGNOSIS — Y838 Other surgical procedures as the cause of abnormal reaction of the patient, or of later complication, without mention of misadventure at the time of the procedure: Secondary | ICD-10-CM | POA: Diagnosis not present

## 2020-08-31 DIAGNOSIS — E1149 Type 2 diabetes mellitus with other diabetic neurological complication: Secondary | ICD-10-CM | POA: Diagnosis present

## 2020-08-31 DIAGNOSIS — I809 Phlebitis and thrombophlebitis of unspecified site: Secondary | ICD-10-CM

## 2020-08-31 DIAGNOSIS — Z992 Dependence on renal dialysis: Secondary | ICD-10-CM

## 2020-08-31 DIAGNOSIS — Z79899 Other long term (current) drug therapy: Secondary | ICD-10-CM

## 2020-08-31 DIAGNOSIS — D696 Thrombocytopenia, unspecified: Secondary | ICD-10-CM | POA: Diagnosis not present

## 2020-08-31 DIAGNOSIS — D631 Anemia in chronic kidney disease: Secondary | ICD-10-CM | POA: Diagnosis present

## 2020-08-31 DIAGNOSIS — B9562 Methicillin resistant Staphylococcus aureus infection as the cause of diseases classified elsewhere: Secondary | ICD-10-CM

## 2020-08-31 DIAGNOSIS — Z8249 Family history of ischemic heart disease and other diseases of the circulatory system: Secondary | ICD-10-CM

## 2020-08-31 DIAGNOSIS — R4182 Altered mental status, unspecified: Secondary | ICD-10-CM

## 2020-08-31 DIAGNOSIS — Z22322 Carrier or suspected carrier of Methicillin resistant Staphylococcus aureus: Secondary | ICD-10-CM

## 2020-08-31 DIAGNOSIS — I808 Phlebitis and thrombophlebitis of other sites: Secondary | ICD-10-CM | POA: Diagnosis not present

## 2020-08-31 DIAGNOSIS — Z6834 Body mass index (BMI) 34.0-34.9, adult: Secondary | ICD-10-CM

## 2020-08-31 DIAGNOSIS — F419 Anxiety disorder, unspecified: Secondary | ICD-10-CM | POA: Diagnosis present

## 2020-08-31 DIAGNOSIS — Z91040 Latex allergy status: Secondary | ICD-10-CM

## 2020-08-31 DIAGNOSIS — G928 Other toxic encephalopathy: Principal | ICD-10-CM | POA: Diagnosis present

## 2020-08-31 DIAGNOSIS — E875 Hyperkalemia: Secondary | ICD-10-CM | POA: Diagnosis present

## 2020-08-31 DIAGNOSIS — E871 Hypo-osmolality and hyponatremia: Secondary | ICD-10-CM | POA: Diagnosis present

## 2020-08-31 DIAGNOSIS — E8770 Fluid overload, unspecified: Secondary | ICD-10-CM | POA: Diagnosis not present

## 2020-08-31 DIAGNOSIS — I160 Hypertensive urgency: Secondary | ICD-10-CM | POA: Diagnosis present

## 2020-08-31 DIAGNOSIS — Z888 Allergy status to other drugs, medicaments and biological substances status: Secondary | ICD-10-CM

## 2020-08-31 DIAGNOSIS — N2581 Secondary hyperparathyroidism of renal origin: Secondary | ICD-10-CM | POA: Diagnosis present

## 2020-08-31 DIAGNOSIS — E1169 Type 2 diabetes mellitus with other specified complication: Secondary | ICD-10-CM | POA: Diagnosis present

## 2020-08-31 DIAGNOSIS — E1122 Type 2 diabetes mellitus with diabetic chronic kidney disease: Secondary | ICD-10-CM | POA: Diagnosis present

## 2020-08-31 DIAGNOSIS — E785 Hyperlipidemia, unspecified: Secondary | ICD-10-CM | POA: Diagnosis present

## 2020-08-31 DIAGNOSIS — G9340 Encephalopathy, unspecified: Secondary | ICD-10-CM | POA: Diagnosis present

## 2020-08-31 DIAGNOSIS — Z91048 Other nonmedicinal substance allergy status: Secondary | ICD-10-CM

## 2020-08-31 DIAGNOSIS — J9601 Acute respiratory failure with hypoxia: Secondary | ICD-10-CM | POA: Diagnosis not present

## 2020-08-31 DIAGNOSIS — E1142 Type 2 diabetes mellitus with diabetic polyneuropathy: Secondary | ICD-10-CM | POA: Diagnosis present

## 2020-08-31 DIAGNOSIS — T82898A Other specified complication of vascular prosthetic devices, implants and grafts, initial encounter: Secondary | ICD-10-CM | POA: Diagnosis not present

## 2020-08-31 DIAGNOSIS — Z9115 Patient's noncompliance with renal dialysis: Secondary | ICD-10-CM

## 2020-08-31 DIAGNOSIS — E669 Obesity, unspecified: Secondary | ICD-10-CM | POA: Diagnosis present

## 2020-08-31 DIAGNOSIS — R0602 Shortness of breath: Secondary | ICD-10-CM

## 2020-08-31 DIAGNOSIS — Z20822 Contact with and (suspected) exposure to covid-19: Secondary | ICD-10-CM | POA: Diagnosis present

## 2020-08-31 DIAGNOSIS — Z8507 Personal history of malignant neoplasm of pancreas: Secondary | ICD-10-CM

## 2020-08-31 DIAGNOSIS — R197 Diarrhea, unspecified: Secondary | ICD-10-CM | POA: Diagnosis present

## 2020-08-31 DIAGNOSIS — N186 End stage renal disease: Secondary | ICD-10-CM | POA: Diagnosis present

## 2020-08-31 DIAGNOSIS — R6520 Severe sepsis without septic shock: Secondary | ICD-10-CM | POA: Diagnosis not present

## 2020-08-31 DIAGNOSIS — Z794 Long term (current) use of insulin: Secondary | ICD-10-CM

## 2020-08-31 DIAGNOSIS — I872 Venous insufficiency (chronic) (peripheral): Secondary | ICD-10-CM | POA: Diagnosis present

## 2020-08-31 DIAGNOSIS — E872 Acidosis: Secondary | ICD-10-CM | POA: Diagnosis not present

## 2020-08-31 DIAGNOSIS — I120 Hypertensive chronic kidney disease with stage 5 chronic kidney disease or end stage renal disease: Secondary | ICD-10-CM | POA: Diagnosis present

## 2020-08-31 DIAGNOSIS — Z7984 Long term (current) use of oral hypoglycemic drugs: Secondary | ICD-10-CM

## 2020-08-31 DIAGNOSIS — A4102 Sepsis due to Methicillin resistant Staphylococcus aureus: Secondary | ICD-10-CM | POA: Diagnosis not present

## 2020-08-31 DIAGNOSIS — Z713 Dietary counseling and surveillance: Secondary | ICD-10-CM

## 2020-08-31 DIAGNOSIS — R4701 Aphasia: Secondary | ICD-10-CM | POA: Diagnosis not present

## 2020-08-31 DIAGNOSIS — R7881 Bacteremia: Secondary | ICD-10-CM

## 2020-08-31 DIAGNOSIS — J810 Acute pulmonary edema: Secondary | ICD-10-CM | POA: Diagnosis not present

## 2020-08-31 DIAGNOSIS — Z09 Encounter for follow-up examination after completed treatment for conditions other than malignant neoplasm: Secondary | ICD-10-CM

## 2020-08-31 NOTE — ED Provider Notes (Signed)
[facility] EMERGENCY DEPARTMENT Provider Note   CSN: [id number] Arrival date & time: 08/31/20  2041     History Chief Complaint  Patient presents with  . Altered Mental Status    Scott Castillo is a 60 y.o. male.  Patient with history of diabetes, cervical radiculopathy, end-stage renal disease on hemodialysis --presents to the emergency department today for altered mental status.  He has a difficult time describing exactly what he is feeling, however he does states that his thinking is slow.  Patient has had transient aphasia with dialysis which has been worked up.  Not felt to be related to stroke or seizure.  Patient currently denies any symptoms.  He admits to missing dialysis sessions on Wednesday and Friday of last week (today is Sunday).  Symptoms have been progressive and worse tonight, prompting ED visit.  He denies fevers or chills.  No chest pain or shortness of breath.  No significant LE edema.  No abd pain, N/V/D.  Patient denies signs of stroke including: facial droop, slurred speech, aphasia, weakness/numbness in extremities, imbalance/trouble walking.  Transported by ambulance tonight.  Blood sugar with EMS 155.         Past Medical History:  Diagnosis Date  . C7 radiculopathy   . Carpal tunnel syndrome, bilateral    by EMG  R>L  . 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 H/O pancreatic cancer   . HLD (hyperlipidemia)   . HTN (hypertension)   . Lumbar radiculopathy, chronic    hemilamectomy '94  . Venous stasis dermatitis of both lower extremities     Patient Active Problem List   Diagnosis Date Noted  . Hyperkalemia, diminished renal excretion 08/15/2020  . Acute metabolic encephalopathy 52/77/8242  . Aphasia 06/16/2020  . ESRD (end stage renal disease) ([place]) 09/04/2019  . Acute renal failure superimposed on chronic kidney disease ([place]) 07/03/2016  . Diabetes mellitus type 2 in obese  ([place]) 07/03/2016  . Hyperkalemia 07/03/2016  . Hypertensive urgency 07/03/2016  . Hyperlipidemia 07/03/2016  . Anemia of chronic disease 07/03/2016  . ARF (acute renal failure) ([place]) 07/03/2016    Past Surgical History:  Procedure Laterality Date  . A/V FISTULAGRAM Left 09/27/2019   Procedure: A/V FISTULAGRAM;  Surgeon: Marty Heck, MD;  Location: [facility];  Service: Cardiovascular;  Laterality: Left;  . AV FISTULA PLACEMENT Left 09/17/2016   Procedure: ARTERIOVENOUS (AV) FISTULA CREATION;  Surgeon: [name], MD;  Location: [facility];  Service: Vascular;  Laterality: Left;  . CATARACT EXTRACTION W/ INTRAOCULAR LENS IMPLANT Left 2015  . ENDOVENOUS ABLATION SAPHENOUS VEIN W/ LASER Right 04/08/2016   EVLA R greater saphenous vein by Curt Jews MD  . ENDOVENOUS ABLATION SAPHENOUS VEIN W/ LASER Left 05/13/2016   endovenous laser ablation (left greater saphenous vein) by Curt Jews MD   . FOOT SURGERY    . HEMILAMINOTOMY LUMBAR SPINE  '94  . TONSILLECTOMY         Family History  Problem Relation Age of Onset  . Edema Mother   . Heart attack Father   . Heart failure Father   . Heart attack Brother   . Heart attack Brother     Social History   Tobacco Use  . Smoking status: Never Smoker  . Smokeless tobacco: Never Used  Substance Use Topics  . Alcohol use: No  . Drug use: No    Home Medications Prior to Admission medications  Medication Sig Start Date End Date Taking? Authorizing Provider  acetaminophen (TYLENOL) 325 MG tablet Take 2 tablets (650 mg total) by mouth every 6 (six) hours as needed for mild pain (or Fever >/= 101). 08/19/20   Domenic Polite, MD  ascorbic acid (VITAMIN C) 1000 MG tablet Take 1,000 mg by mouth daily.    [provider]  carvedilol (COREG) 25 MG tablet Take 25 mg by mouth 2 (two) times daily with a meal.  07/28/15   [provider]  cyanocobalamin 1000 MCG tablet Take 1,000 mcg by mouth daily.    [provider]  ferric citrate (AURYXIA) 1 GM 210 MG(Fe) tablet Take 210-420 mg by mouth See admin instructions. Take 2 tablets (420 mg) by mouth 3 times daily with meals and 1 tablet (210 mg) with snacks    [provider]  Insulin Pen Needle (BD ULTRA-FINE PEN NEEDLES) 29G X 12.7MM MISC 1 each by Other route as directed. 09/22/19   [provider]  irbesartan (AVAPRO) 150 MG tablet Take 150 mg by mouth daily. 09/05/19   [provider]  pravastatin (PRAVACHOL) 80 MG tablet Take 80 mg by mouth every evening.     [provider]  Semaglutide (RYBELSUS) 14 MG TABS Take 14 mg by mouth daily. 10/05/18   [provider]  torsemide (DEMADEX) 100 MG tablet Take 100 mg by mouth daily. 02/20/20   [provider]    Allergies    Heparin, Pioglitazone, Adhesive [tape], and Latex  Review of Systems   Review of Systems  Constitutional: Negative for fever.  HENT: Negative for rhinorrhea and sore throat.   Eyes: Negative for redness.  Respiratory: Negative for cough and shortness of breath.   Cardiovascular: Negative for chest pain.  Gastrointestinal: Negative for abdominal pain, diarrhea, nausea and vomiting.  Genitourinary: Negative for flank pain.  Musculoskeletal: Negative for myalgias.  Skin: Negative for rash.  Neurological: Negative for speech difficulty, weakness and headaches.  Psychiatric/Behavioral: Positive for confusion. The patient is not nervous/anxious.     Physical Exam Updated Vital Signs BP (!) 197/104 (BP Location: Right Arm)   Pulse 89   Temp 98.6 F (37 C) (Oral)   Resp 20   SpO2 96%   Physical Exam Vitals and nursing note reviewed.  Constitutional:      Appearance: He is well-developed.  HENT:     Head: Normocephalic and atraumatic.  Eyes:     General:        Right eye: No discharge.        Left eye: No discharge.     Conjunctiva/sclera: Conjunctivae normal.  Cardiovascular:     Rate and Rhythm: Normal rate and  regular rhythm.     Heart sounds: Normal heart sounds.  Pulmonary:     Effort: Pulmonary effort is normal.     Breath sounds: Normal breath sounds.  Abdominal:     Palpations: Abdomen is soft.     Tenderness: There is no abdominal tenderness.  Musculoskeletal:     Cervical back: Normal range of motion and neck supple.     Comments: Chronic bilateral venous stasis changes of the lower extremities.  1-2+ edema noted.  Skin:    General: Skin is warm and dry.  Neurological:     General: No focal deficit present.     Mental Status: He is alert and oriented to person, place, and time.     Cranial Nerves: No cranial nerve deficit.     Sensory:  No sensory deficit.     Motor: No weakness.     Comments: Patient seems to be neurologically intact.  He is just slow to respond to some questions, but does answer appropriately.  No aphasia or slurred speech.     ED Results / Procedures / Treatments   Labs (all labs ordered are listed, but only abnormal results are displayed) Labs Reviewed  CBC WITH DIFFERENTIAL/PLATELET  COMPREHENSIVE METABOLIC PANEL  CBG MONITORING, ED    ED ECG REPORT   Date: 08/31/2020  Rate: 87  Rhythm: normal sinus rhythm  QRS Axis: normal  Intervals: normal  ST/T Wave abnormalities: normal  Conduction Disutrbances:none  Narrative Interpretation:   Old EKG Reviewed: changes noted, t-waves less pronounced today compared to 2020-08-27  I have personally reviewed the EKG tracing and agree with the computerized printout as noted.  Radiology No results found.  Procedures Procedures   Medications Ordered in ED Medications - No data to display  ED Course  I have reviewed the triage vital signs and the nursing notes.  Pertinent labs & imaging results that were available during my care of the patient were reviewed by me and considered in my medical decision making (see chart for details).  Patient seen and examined. Work-up initiated. EKG reviewed -- no findings  concerning for hypokalemia.   Vital signs reviewed and are as follows: BP (!) 197/104 (BP Location: Right Arm)   Pulse 89   Temp 98.6 F (37 C) (Oral)   Resp 20   Ht 6' (1.829 m)   Wt 113.9 kg   SpO2 99%   BMI 34.06 kg/m   11:14 PM Delay in work-up due to staff taking care of a sick patient who coded.   Pt continues to await lab draw. CXR done but not read as of yet.   Signout to [employer] at shift change.     MDM Rules/Calculators/A&P                          Pending completion of work-up.   Final Clinical Impression(s) / ED Diagnoses Final diagnoses:  Altered mental status, unspecified altered mental status type    Rx / DC Orders ED Discharge Orders    None       Carlisle Cater, PA-C 08/31/20 2317    Horton, Alvin Critchley, DO 09/01/20 1620

## 2020-08-31 NOTE — ED Triage Notes (Signed)
BIBA from home complaining of worsening "brain fog." Pt has missed dialysis x last 78 days. Able to answer all questions. Stroke scale neg. States "I just feel slow."   EMS Vitals  HR 95 96% RA 186/86 CBG 155

## 2020-08-31 NOTE — ED Notes (Signed)
Placed pt on cardiac monitor

## 2020-08-31 NOTE — ED Provider Notes (Signed)
Patient signed out to me at shift change.  Reports feeling "foggy headed."  Has missed last 2 dialysis sessions.  ?uremia.  Labs and CXR pending.  BUN is 119, potassium 5.7, he also has an anion gap of 19.  Blood pressure is 204/89.  No evidence for volume overload on chest x-ray.  Case discussed with Dr. Jonnie Finner, from nephrology, who recommends admission to medicine and dialysis in the morning.  Appreciate Dr. Marlowe Sax for admitting.    Montine Circle, PA-C 09/01/20 0209    Fatima Blank, MD 09/01/20 2114

## 2020-09-01 ENCOUNTER — Observation Stay (HOSPITAL_COMMUNITY): Payer: Medicare Other

## 2020-09-01 ENCOUNTER — Other Ambulatory Visit: Payer: Self-pay

## 2020-09-01 DIAGNOSIS — G9340 Encephalopathy, unspecified: Secondary | ICD-10-CM | POA: Diagnosis present

## 2020-09-01 DIAGNOSIS — N186 End stage renal disease: Secondary | ICD-10-CM | POA: Diagnosis not present

## 2020-09-01 DIAGNOSIS — R410 Disorientation, unspecified: Secondary | ICD-10-CM

## 2020-09-01 DIAGNOSIS — E669 Obesity, unspecified: Secondary | ICD-10-CM | POA: Diagnosis not present

## 2020-09-01 DIAGNOSIS — E1169 Type 2 diabetes mellitus with other specified complication: Secondary | ICD-10-CM | POA: Diagnosis not present

## 2020-09-01 DIAGNOSIS — E875 Hyperkalemia: Secondary | ICD-10-CM | POA: Diagnosis not present

## 2020-09-01 LAB — BLOOD GAS, ARTERIAL
Acid-base deficit: 2 mmol/L (ref 0.0–2.0)
Bicarbonate: 21.3 mmol/L (ref 20.0–28.0)
Drawn by: 275531
FIO2: 21
O2 Saturation: 93 %
Patient temperature: 36.9
pCO2 arterial: 30 mmHg — ABNORMAL LOW (ref 32.0–48.0)
pH, Arterial: 7.464 — ABNORMAL HIGH (ref 7.350–7.450)
pO2, Arterial: 67.1 mmHg — ABNORMAL LOW (ref 83.0–108.0)

## 2020-09-01 LAB — BASIC METABOLIC PANEL
Anion gap: 15 (ref 5–15)
BUN: 90 mg/dL — ABNORMAL HIGH (ref 6–20)
CO2: 22 mmol/L (ref 22–32)
Calcium: 8.5 mg/dL — ABNORMAL LOW (ref 8.9–10.3)
Chloride: 96 mmol/L — ABNORMAL LOW (ref 98–111)
Creatinine, Ser: 14.27 mg/dL — ABNORMAL HIGH (ref 0.61–1.24)
GFR, Estimated: 4 mL/min — ABNORMAL LOW (ref >60)
Glucose, Bld: 157 mg/dL — ABNORMAL HIGH (ref 70–99)
Potassium: 4.5 mmol/L (ref 3.5–5.1)
Sodium: 133 mmol/L — ABNORMAL LOW (ref 135–145)

## 2020-09-01 LAB — RESP PANEL BY RT-PCR (FLU A&B, COVID) ARPGX2
Influenza A by PCR: NEGATIVE
Influenza B by PCR: NEGATIVE
SARS Coronavirus 2 by RT PCR: NEGATIVE

## 2020-09-01 LAB — GASTROINTESTINAL PANEL BY PCR, STOOL (REPLACES STOOL CULTURE)

## 2020-09-01 LAB — CBC WITH DIFFERENTIAL/PLATELET
Abs Immature Granulocytes: 0.05 10*3/uL (ref 0.00–0.07)
Basophils Absolute: 0 10*3/uL (ref 0.0–0.1)
Basophils Relative: 0 %
Eosinophils Absolute: 0.2 10*3/uL (ref 0.0–0.5)
Eosinophils Relative: 2 %
HCT: 29.9 % — ABNORMAL LOW (ref 39.0–52.0)
Hemoglobin: 10.1 g/dL — ABNORMAL LOW (ref 13.0–17.0)
Immature Granulocytes: 1 %
Lymphocytes Relative: 9 %
Lymphs Abs: 0.8 10*3/uL (ref 0.7–4.0)
MCH: 30.5 pg (ref 26.0–34.0)
MCHC: 33.8 g/dL (ref 30.0–36.0)
MCV: 90.3 fL (ref 80.0–100.0)
Monocytes Absolute: 0.9 10*3/uL (ref 0.1–1.0)
Monocytes Relative: 10 %
Neutro Abs: 7.3 10*3/uL (ref 1.7–7.7)
Neutrophils Relative %: 78 %
Platelets: 199 10*3/uL (ref 150–400)
RBC: 3.31 MIL/uL — ABNORMAL LOW (ref 4.22–5.81)
RDW: 12.9 % (ref 11.5–15.5)
WBC: 9.3 10*3/uL (ref 4.0–10.5)
nRBC: 0 % (ref 0.0–0.2)

## 2020-09-01 LAB — CBC
HCT: 25.8 % — ABNORMAL LOW (ref 39.0–52.0)
Hemoglobin: 9 g/dL — ABNORMAL LOW (ref 13.0–17.0)
MCH: 31 pg (ref 26.0–34.0)
MCHC: 34.9 g/dL (ref 30.0–36.0)
MCV: 89 fL (ref 80.0–100.0)
Platelets: 176 10*3/uL (ref 150–400)
RBC: 2.9 MIL/uL — ABNORMAL LOW (ref 4.22–5.81)
RDW: 12.9 % (ref 11.5–15.5)
WBC: 5.9 10*3/uL (ref 4.0–10.5)
nRBC: 0 % (ref 0.0–0.2)

## 2020-09-01 LAB — GLUCOSE, CAPILLARY
Glucose-Capillary: 153 mg/dL — ABNORMAL HIGH (ref 70–99)
Glucose-Capillary: 151 mg/dL — ABNORMAL HIGH (ref 70–99)
Glucose-Capillary: 144 mg/dL — ABNORMAL HIGH (ref 70–99)
Glucose-Capillary: 148 mg/dL — ABNORMAL HIGH (ref 70–99)
Glucose-Capillary: 118 mg/dL — ABNORMAL HIGH (ref 70–99)

## 2020-09-01 LAB — RENAL FUNCTION PANEL
Albumin: 2.9 g/dL — ABNORMAL LOW (ref 3.5–5.0)
Anion gap: 16 — ABNORMAL HIGH (ref 5–15)
BUN: 89 mg/dL — ABNORMAL HIGH (ref 6–20)
CO2: 22 mmol/L (ref 22–32)
Calcium: 8.5 mg/dL — ABNORMAL LOW (ref 8.9–10.3)
Chloride: 96 mmol/L — ABNORMAL LOW (ref 98–111)
Creatinine, Ser: 14.17 mg/dL — ABNORMAL HIGH (ref 0.61–1.24)
GFR, Estimated: 4 mL/min — ABNORMAL LOW (ref >60)
Glucose, Bld: 157 mg/dL — ABNORMAL HIGH (ref 70–99)
Phosphorus: 9.1 mg/dL — ABNORMAL HIGH (ref 2.5–4.6)
Potassium: 4.6 mmol/L (ref 3.5–5.1)
Sodium: 134 mmol/L — ABNORMAL LOW (ref 135–145)

## 2020-09-01 LAB — COMPREHENSIVE METABOLIC PANEL
ALT: 13 U/L (ref 0–44)
AST: 13 U/L — ABNORMAL LOW (ref 15–41)
Albumin: 3.4 g/dL — ABNORMAL LOW (ref 3.5–5.0)
Alkaline Phosphatase: 66 U/L (ref 38–126)
Anion gap: 19 — ABNORMAL HIGH (ref 5–15)
BUN: 119 mg/dL — ABNORMAL HIGH (ref 6–20)
CO2: 16 mmol/L — ABNORMAL LOW (ref 22–32)
Calcium: 9 mg/dL (ref 8.9–10.3)
Chloride: 96 mmol/L — ABNORMAL LOW (ref 98–111)
Creatinine, Ser: 17.52 mg/dL — ABNORMAL HIGH (ref 0.61–1.24)
GFR, Estimated: 3 mL/min — ABNORMAL LOW (ref >60)
Glucose, Bld: 119 mg/dL — ABNORMAL HIGH (ref 70–99)
Potassium: 5.7 mmol/L — ABNORMAL HIGH (ref 3.5–5.1)
Sodium: 131 mmol/L — ABNORMAL LOW (ref 135–145)
Total Bilirubin: 0.9 mg/dL (ref 0.3–1.2)
Total Protein: 7 g/dL (ref 6.5–8.1)

## 2020-09-01 LAB — TSH: TSH: 0.473 u[IU]/mL (ref 0.350–4.500)

## 2020-09-01 LAB — MRSA PCR SCREENING: MRSA by PCR: POSITIVE — AB

## 2020-09-01 LAB — C DIFFICILE QUICK SCREEN W PCR REFLEX
C Diff antigen: NEGATIVE
C Diff interpretation: NOT DETECTED
C Diff toxin: NEGATIVE

## 2020-09-01 LAB — CBG MONITORING, ED
Glucose-Capillary: 124 mg/dL — ABNORMAL HIGH (ref 70–99)
Glucose-Capillary: 155 mg/dL — ABNORMAL HIGH (ref 70–99)

## 2020-09-01 LAB — AMMONIA: Ammonia: 16 umol/L (ref 9–35)

## 2020-09-01 MED ORDER — ACETAMINOPHEN 325 MG PO TABS: 650.0000 mg | ORAL_TABLET | Freq: Four times a day (QID) | ORAL | Status: DC | PRN

## 2020-09-01 MED ORDER — CHLORHEXIDINE GLUCONATE CLOTH 2 % EX PADS: 6.0000 | MEDICATED_PAD | Freq: Every day | CUTANEOUS | Status: DC

## 2020-09-01 MED ORDER — LORAZEPAM 2 MG/ML IJ SOLN
INTRAMUSCULAR | Status: AC
  Filled 2020-09-01: qty 1

## 2020-09-01 MED ORDER — CHLORHEXIDINE GLUCONATE CLOTH 2 % EX PADS
6.0000 | MEDICATED_PAD | Freq: Every day | CUTANEOUS | Status: DC
  Administered 2020-09-01 – 2020-09-04 (×4): 6 via TOPICAL

## 2020-09-01 MED ORDER — LORAZEPAM 2 MG/ML IJ SOLN
0.5000 mg | Freq: Four times a day (QID) | INTRAMUSCULAR | Status: DC | PRN
  Administered 2020-09-01 – 2020-09-03 (×2): 1 mg via INTRAVENOUS
  Filled 2020-09-01: qty 1

## 2020-09-01 MED ORDER — IRBESARTAN 75 MG PO TABS
150.0000 mg | ORAL_TABLET | Freq: Every day | ORAL | Status: DC
  Administered 2020-09-01 – 2020-09-08 (×8): 150 mg via ORAL
  Filled 2020-09-01 (×2): qty 2
  Filled 2020-09-01 (×2): qty 1
  Filled 2020-09-01: qty 2
  Filled 2020-09-01: qty 1
  Filled 2020-09-01: qty 2
  Filled 2020-09-01 (×2): qty 1

## 2020-09-01 MED ORDER — LOPERAMIDE HCL 2 MG PO CAPS: 2.0000 mg | ORAL_CAPSULE | Freq: Four times a day (QID) | ORAL | Status: DC | PRN

## 2020-09-01 MED ORDER — LORAZEPAM 2 MG/ML IJ SOLN
0.5000 mg | Freq: Once | INTRAMUSCULAR | Status: AC
  Administered 2020-09-01: 0.5 mg via INTRAVENOUS

## 2020-09-01 MED ORDER — HEPARIN SODIUM (PORCINE) 5000 UNIT/ML IJ SOLN
5000.0000 [IU] | Freq: Three times a day (TID) | INTRAMUSCULAR | Status: DC
  Administered 2020-09-03 – 2020-09-04 (×2): 5000 [IU] via SUBCUTANEOUS
  Filled 2020-09-01 (×8): qty 1

## 2020-09-01 MED ORDER — ACETAMINOPHEN 650 MG RE SUPP
650.0000 mg | Freq: Four times a day (QID) | RECTAL | Status: DC | PRN
  Administered 2020-09-03: 650 mg via RECTAL
  Filled 2020-09-01: qty 1

## 2020-09-01 MED ORDER — CARVEDILOL 12.5 MG PO TABS
25.0000 mg | ORAL_TABLET | Freq: Once | ORAL | Status: AC
  Administered 2020-09-01: 25 mg via ORAL
  Filled 2020-09-01: qty 2

## 2020-09-01 MED ORDER — PRAVASTATIN SODIUM 40 MG PO TABS
80.0000 mg | ORAL_TABLET | Freq: Every evening | ORAL | Status: DC
  Administered 2020-09-01 – 2020-09-08 (×8): 80 mg via ORAL
  Filled 2020-09-01 (×8): qty 2

## 2020-09-01 MED ORDER — CARVEDILOL 25 MG PO TABS
25.0000 mg | ORAL_TABLET | Freq: Two times a day (BID) | ORAL | Status: DC
  Administered 2020-09-01 – 2020-09-08 (×14): 25 mg via ORAL
  Filled 2020-09-01 (×14): qty 1

## 2020-09-01 MED ORDER — MUPIROCIN 2 % EX OINT
1.0000 "application " | TOPICAL_OINTMENT | Freq: Two times a day (BID) | CUTANEOUS | Status: AC
  Administered 2020-09-01 – 2020-09-06 (×10): 1 via NASAL
  Filled 2020-09-01 (×3): qty 22

## 2020-09-01 MED ORDER — INSULIN ASPART 100 UNIT/ML IJ SOLN
0.0000 [IU] | Freq: Every day | INTRAMUSCULAR | Status: DC
  Administered 2020-09-06: 2 [IU] via SUBCUTANEOUS

## 2020-09-01 MED ORDER — INSULIN ASPART 100 UNIT/ML IJ SOLN
0.0000 [IU] | Freq: Three times a day (TID) | INTRAMUSCULAR | Status: DC
  Administered 2020-09-01 – 2020-09-08 (×12): 1 [IU] via SUBCUTANEOUS

## 2020-09-01 MED ORDER — HYDRALAZINE HCL 20 MG/ML IJ SOLN
5.0000 mg | INTRAMUSCULAR | Status: DC | PRN
  Administered 2020-09-03: 5 mg via INTRAVENOUS
  Filled 2020-09-01: qty 1

## 2020-09-01 NOTE — ED Notes (Signed)
Attempted report to inpatient unit, unable to take report at this time.

## 2020-09-01 NOTE — TOC Initial Note (Signed)
Transition of Care [facility]) - Initial/Assessment Note    Patient Details  Name: Scott Castillo MRN: [id number] Date of Birth: 04/02/1961  [facility] CM/SW Contact:    Verdell Carmine, RN Phone Number: 09/01/2020, 4:49 PM  Clinical Narrative:                 Admitted with encephaloph\athy missed some sessions of dialysis due to  Vomiting, loose stools. Feels like he has "brain fog"  Had a period of agitation treated with ativan. Dialyzed only a hour today due to this. Patient lives with wife. No needs at present CM will follow for any needs.   Expected Discharge Plan: Home/Self Care Barriers to Discharge: Continued Medical Work up   Patient Goals and CMS Choice        Expected Discharge Plan and Services Expected Discharge Plan: Home/Self Care   Discharge Planning Services: CM Consult   Living arrangements for the past 2 months: Single Family Home                                      Prior Living Arrangements/Services Living arrangements for the past 2 months: Single Family Home Lives with:: Spouse Patient language and need for interpreter reviewed:: Yes        Need for Family Participation in Patient Care: Yes (Comment) Care giver support system in place?: Yes (comment)   Criminal Activity/Legal Involvement Pertinent to Current Situation/Hospitalization: No - Comment as needed  Activities of Daily Living Home Assistive Devices/Equipment: None ADL Screening (condition at time of admission) Patient's cognitive ability adequate to safely complete daily activities?: Yes Is the patient deaf or have difficulty hearing?: No Does the patient have difficulty seeing, even when wearing glasses/contacts?: No Does the patient have difficulty concentrating, remembering, or making decisions?: No Patient able to express need for assistance with ADLs?: Yes Does the patient have difficulty dressing or bathing?: No Independently performs ADLs?: Yes (appropriate  for developmental age) Does the patient have difficulty walking or climbing stairs?: No Weakness of Legs: Both Weakness of Arms/Hands: None  Permission Sought/Granted                  Emotional Assessment       Orientation: : Fluctuating Orientation (Suspected and/or reported Sundowners) Alcohol / Substance Use: Not Applicable Psych Involvement: No (comment)  Admission diagnosis:  Uremia [N19] Encephalopathy [G93.40] Altered mental status, unspecified altered mental status type [R41.82] Patient Active Problem List   Diagnosis Date Noted  . Encephalopathy 09/01/2020  . Hyperkalemia, diminished renal excretion 08/15/2020  . Acute metabolic encephalopathy 62/37/6283  . Aphasia 06/16/2020  . ESRD (end stage renal disease) ([place]) 09/04/2019  . Acute renal failure superimposed on chronic kidney disease ([place]) 07/03/2016  . Diabetes mellitus type 2 in obese ([place]) 07/03/2016  . Hyperkalemia 07/03/2016  . Hypertensive urgency 07/03/2016  . Hyperlipidemia 07/03/2016  . Anemia of chronic disease 07/03/2016  . ARF (acute renal failure) ([place]) 07/03/2016   PCP:  Algis Greenhouse, MD Pharmacy:  No Pharmacies Listed    Social Determinants of Health (SDOH) Interventions    Readmission Risk Interventions Readmission Risk Prevention Plan 06/19/2020  Transportation Screening Complete  PCP or Specialist Appt within 3-5 Days Complete  HRI or [place] Complete  Social Work Consult for [place] Planning/Counseling Complete  Palliative Care Screening Complete  Medication Review Press photographer) Referral to Pharmacy  Some recent data might be hidden

## 2020-09-01 NOTE — Progress Notes (Signed)
Patient admitted to the hospital earlier this morning by Dr. Marlowe Sax  Patient seen and examined.  He is sitting up in bed, able to carry on a conversation. He feels that "brain fog" is still present, although may be mildly better since admission. He continues to have diarrhea. This was preceded by nausea and vomiting which has now resolved.  A/P  Acute metabolic encephalopathy -suspect this is related to uremia -should hopefully improve with dialysis  ESRD on HD MWF -normally gets dialysis at [facility] in [place] -patient missed last 2 HD sessions on Wed and Fri -last session was last Monday and only completed an hour of that session -nephrology has been consulted for dialysis  Hyperkalemia -should hopefully correct with dialysis  Hypertensive urgency -blood pressure up to 200s on admission -continues on coreg and irbesartan -blood pressures currently stable  Diarrhea -suspect viral gastroenteritis process -no recent antibiotics -no reported fevers -nausea vomiting now resolved -GI pathogen panel ordered -treat supportively with antidiarrheals  Type 2 DM -A1c 6.4 -continue on SSI  HLD -continue on statin  [employer]

## 2020-09-01 NOTE — Significant Event (Signed)
Rapid Response Event Note   Reason for Call :  Agitation during hemodialysis treatment  Initial Focused Assessment:  Pt restless, disoriented. When asked orientation questions he grunts/moans unintelligible answers. Attempts to reorient pt are unsuccessful. Pt is moving all extremities, not following commands. Sensation appears to be equal in all extremities. EOMI.   Skin is warm, moist. Lung sounds are clear. Heart rate is regular.  HD treatment was terminated after approximately 1.5 hours due to sudden onset confusion, agitation.   VS: BP 160/97, HR 84, RR 25, SpO2 100% on room air CBG: 148  Interventions:  -0.5mg  IV Ativan x1  Plan of Care:  -PRN Ativan for anxiety -CT head- postponed due to agitation, MD aware -BMP, ABG -EEG  Call rapid response for additional needs  Event Summary:  MD Notified: Dr. Roderic Palau Call Time: 6837 Arrival Time: 2902 End Time: [place], RN

## 2020-09-01 NOTE — Consult Note (Addendum)
Neurology Consultation  Reason for Consult: Acute encephalopathy, aphasia during dialysis Referring Physician: Dr. Roderic Palau  CC: Acute encephalopathy during dialysis  History is obtained from: Chart review, attending MD, rapid response RN  HPI: Scott Castillo is a 60 y.o. male with a medical history significant for end stage renal disease with hemodialysis Mondays, Wednesdays, Fridays, type 2 diabetes mellitus with polyneuropathy, hypertension, hyperlipidemia, anemia of chronic disease, venous stasis dermitis of bilateral lower extremities, pancreatic cancer, and secondary hyperparathyroidism who initially presented to the ED on 09/01/20 for evaluation of "brain fog" after missing his hemodialysis twice. Initial work up was significant for hypertension with a SBP around 200 mmHg, WBC 9.3, potassium 5.7, bicarb 16, BUN 119, and creatinine of 17.5 (with Cr of ~ 7.3 13 days ago). He was started on HD inpatient today around 2 pm and was communicating with staff without issue. About 1.5 hours into dialysis, patient became confused, agitated, aggressive, and aphasic prompting a rapid response activation. Neurology was consulted for further concerns of right gaze with acute aphasia.  Patient with a similar presentation in February and at that time, his wife reported aphasia repeatedly following each dialysis appointment. In addition, he becomes pale, nauseous, altered, has numbness in his arms, and aphasia for approximately 5 hours before returning to baseline. 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   LKW: 14:00 when starting dialysis tpa given?: no, unable to get safely obtain [facility] initially due to patient severe agitation; for patient safety he was returned to his room until CT can safely be obtained IR Thrombectomy? No, presentation not consistent with LVO Modified Rankin  Scale: 0-Completely asymptomatic and back to baseline post- stroke   ROS: Unable to obtain due to altered mental status.   Past Medical History:  Diagnosis Date  . C7 radiculopathy   . Carpal tunnel syndrome, bilateral    by EMG  R>L  . 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 H/O pancreatic cancer   . HLD (hyperlipidemia)   . HTN (hypertension)   . Lumbar radiculopathy, chronic    hemilamectomy '94  . Venous stasis dermatitis of both lower extremities    Past Surgical History:  Procedure Laterality Date  . A/V FISTULAGRAM Left 09/27/2019   Procedure: A/V FISTULAGRAM;  Surgeon: Marty Heck, MD;  Location: [place] [facility];  Service: Cardiovascular;  Laterality: Left;  . AV FISTULA PLACEMENT Left 09/17/2016   Procedure: ARTERIOVENOUS (AV) FISTULA CREATION;  Surgeon: [name], MD;  Location: [place];  Service: Vascular;  Laterality: Left;  . CATARACT EXTRACTION W/ INTRAOCULAR LENS IMPLANT Left 2015  . ENDOVENOUS ABLATION SAPHENOUS VEIN W/ LASER Right 04/08/2016   EVLA R greater saphenous vein by Curt Jews MD  . ENDOVENOUS ABLATION SAPHENOUS VEIN W/ LASER Left 05/13/2016   endovenous laser ablation (left greater saphenous vein) by Curt Jews MD   . FOOT SURGERY    . HEMILAMINOTOMY LUMBAR SPINE  '94  . TONSILLECTOMY     Family History  Problem Relation Age of Onset  . Edema Mother   . Heart attack Father   . Heart failure Father   . Heart attack Brother   . Heart attack Brother    Social History:   reports that he has never smoked. He has never used smokeless tobacco. He reports that he does not drink alcohol and  does not use drugs.  Medications  Current Facility-Administered Medications:  .  acetaminophen (TYLENOL) tablet 650 mg, 650 mg, Oral, Q6H PRN **OR** acetaminophen (TYLENOL) suppository 650 mg, 650 mg, Rectal, Q6H PRN, Shela Leff, MD .  carvedilol (COREG) tablet 25 mg, 25 mg, Oral, BID WC,  Shela Leff, MD, 25 mg at 09/01/20 0943 .  Chlorhexidine Gluconate Cloth 2 % PADS 6 each, 6 each, Topical, Q0600, Roney Jaffe, MD, 6 each at 09/01/20 1208 .  heparin injection 5,000 Units, 5,000 Units, Subcutaneous, Q8H, Rathore, Vasundhra, MD .  hydrALAZINE (APRESOLINE) injection 5 mg, 5 mg, Intravenous, Q4H PRN, Shela Leff, MD .  insulin aspart (novoLOG) injection 0-5 Units, 0-5 Units, Subcutaneous, QHS, Rathore, Vasundhra, MD .  insulin aspart (novoLOG) injection 0-6 Units, 0-6 Units, Subcutaneous, TID WC, Shela Leff, MD, 1 Units at 09/01/20 1224 .  irbesartan (AVAPRO) tablet 150 mg, 150 mg, Oral, Daily, Shela Leff, MD, 150 mg at 09/01/20 0943 .  loperamide (IMODIUM) capsule 2 mg, 2 mg, Oral, Q6H PRN, Kathie Dike, MD .  LORazepam (ATIVAN) 2 MG/ML injection, , , ,  .  LORazepam (ATIVAN) injection 0.5-1 mg, 0.5-1 mg, Intravenous, Q6H PRN, Kathie Dike, MD, 1 mg at 09/01/20 1635 .  pravastatin (PRAVACHOL) tablet 80 mg, 80 mg, Oral, QPM, Shela Leff, MD  Exam: Current vital signs: BP (!) 160/97 (BP Location: Right Arm)   Pulse 85   Temp 97.8 F (36.6 C) (Axillary)   Resp (!) 25   Ht 6' (1.829 m)   Wt 113.5 kg   SpO2 100%   BMI 33.94 kg/m  Vital signs in last 24 hours: Temp:  [97.7 F (36.5 C)-98.6 F (37 C)] 97.8 F (36.6 C) (05/16 1455) Pulse Rate:  [65-93] 85 (05/16 1500) Resp:  [11-25] 25 (05/16 1500) BP: (105-204)/(71-107) 160/97 (05/16 1500) SpO2:  [90 %-100 %] 100 % (05/16 1500) Weight:  [113.5 kg-114.6 kg] 113.5 kg (05/16 1455)  GENERAL: Awake, restless, agitated Head: Normocephalic and atraumatic, dry mm EENT: Normal conjunctivae, no OP obstruction LUNGS: Normal respiratory effort. Non-labored breathing CV: Regular rate on telemetry ABDOMEN: Soft, non-tender, non-distended Ext: warm, without obvious deformity  NEURO:  Mental Status: Awake, alert, able to state name and year. He is unable to provide a clear or coherent  history of present illness. Patient perseverates on "please" and does not answer further orientation questions.  Speech/Language: speech is aphasic and limited to "please" without further usable words.   No neglect noted.  Cranial Nerves:  II: PERRL 4 mm/brisk. Attends to stimuli in all visual fields  III, IV, VI: EOMI without ptosis V: Unable to assess due to patient mental status VII: Face is symmetric resting and smiling.  VIII: Hearing is intact to voice IX, X: Palate elevation is symmetric. Phonation normal.  XI: Unable to assess due to patient mental status- does not follow commands XII: Tongue protrudes midline without fasciculations.   Motor: Patient does not follow commands to fully assess strength but he moves all extremities spontaneously and antigravity movement. Thrashes in bed and attempts to ambulate, moves bilateral upper and lower extremities symmetrically.  Tone and bulk are normal.  Sensation: Unable to assess due to patient mental status Coordination: Unable to assess due to patient's mental status DTRs: 3+ and symmetric patellae Gait: deferred  NIHSS: 1a Level of Conscious.: 0 1b LOC Questions: 2 1c LOC Commands: 2 2 Best Gaze: 1 3 Visual: 0 4 Facial Palsy: 0 5a Motor Arm - left: 0 5b Motor Arm - Right:  0 6a Motor Leg - Left: 2; able to move against gravity but does not follow commands to sustain antigravity movement 6b Motor Leg - Right: 2; able to move against gravity but does not follow commands to sustain antigravity movement 7 Limb Ataxia: 0 8 Sensory: 0 9 Best Language: 2 10 Dysarthria: 0 11 Extinct. and Inatten.: 0 TOTAL: 11  Labs I have reviewed labs in epic and the results pertinent to this consultation are: CBC    Component Value Date/Time   WBC 9.3 08/31/2020 2112   RBC 3.31 (L) 08/31/2020 2112   HGB 10.1 (L) 08/31/2020 2112   HCT 29.9 (L) 08/31/2020 2112   PLT 199 08/31/2020 2112   MCV 90.3 08/31/2020 2112   MCH 30.5 08/31/2020 2112    MCHC 33.8 08/31/2020 2112   RDW 12.9 08/31/2020 2112   LYMPHSABS 0.8 08/31/2020 2112   MONOABS 0.9 08/31/2020 2112   EOSABS 0.2 08/31/2020 2112   BASOSABS 0.0 08/31/2020 2112    CMP     Component Value Date/Time   NA 134 (L) 09/01/2020 1758   NA 133 (L) 09/01/2020 1758   K 4.6 09/01/2020 1758   K 4.5 09/01/2020 1758   CL 96 (L) 09/01/2020 1758   CL 96 (L) 09/01/2020 1758   CO2 22 09/01/2020 1758   CO2 22 09/01/2020 1758   GLUCOSE 157 (H) 09/01/2020 1758   GLUCOSE 157 (H) 09/01/2020 1758   BUN 89 (H) 09/01/2020 1758   BUN 90 (H) 09/01/2020 1758   CREATININE 14.17 (H) 09/01/2020 1758   CREATININE 14.27 (H) 09/01/2020 1758   CALCIUM 8.5 (L) 09/01/2020 1758   CALCIUM 8.5 (L) 09/01/2020 1758   PROT 7.0 08/31/2020 2112   ALBUMIN 2.9 (L) 09/01/2020 1758   AST 13 (L) 08/31/2020 2112   ALT 13 08/31/2020 2112   ALKPHOS 66 08/31/2020 2112   BILITOT 0.9 08/31/2020 2112   GFRNONAA 4 (L) 09/01/2020 1758   GFRNONAA 4 (L) 09/01/2020 1758   GFRAA 14 (L) 07/06/2016 0502   Lipid Panel  No results found for: CHOL, TRIG, HDL, CHOLHDL, VLDL, LDLCALC, LDLDIRECT  Lab Results  Component Value Date   HGBA1C 6.4 (H) 08/19/2020   Imaging I have reviewed the images obtained:  CT-scan of the brain pending from today-patient uncooperative and agitated on the table. CT head and CTA head and neck: 06/16/2020: Question of small right insular hypodensity at that time with negative MRI brain on follow-up March 05/08/2020.  CTA head and neck with no emergent LVO.  Assessment: 60 y.o. male with PMHx of ESRD on HD, diabetes mellitus type 2, HLD, anemia of chronic disease, venous stasis dermatitis of both lower extremities presented with "brain fog" and became acutely altered with expressive aphasia during inpatient hemodialysis today 5/16. - On chart review, in February wife noted that patient has had these episodes of expressive aphasia and confusion after his dialysis sessions that takes around 5 hours  for him to return to his baseline. - On assessment SBP was around 150 mmHg; lab work in the ED revealed a WBC 9.3, potassium 5.7, bicarb 16, BUN 119, and creatinine of 17.5 (with Cr of ~ 7.3 13 days ago).  - Initial evaluation showed globally aphasic patient that was agitated and restless and it was felt that he was unsafe to undergo CT at that time. He was thrashing all extremities equally and perseverating on stating "please" without further usable speech.  - CT head to be obtained as soon as it is safe  to do so.  -Transient hypoperfusion might be leading this is repetitive episodes post hemodialysis with presenting blood pressure of > 200 and blood pressure on rapid response arrival was in the 614'J systolic.  - To rule out seizures a STAT EEG has been ordered - DDx also includes disequilibrium syndrome (DDS) as a rare but serious complication of hemodialysis.  It is usually producing neurological symptoms such as headaches, nausea, altered mental status, convulsions.  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 Examination consistent with encephalopathy-nonfocal.  Recommendations as above-needs imaging. Stat EEG negative for seizures-no need for LTM EEG. Stat head imaging when possible-we took him down to CT scanner to get a CT head but he was too agitated to stay safely on the table. We will follow  -- Amie Portland, MD Neurologist [facility] Pager: [phone]

## 2020-09-01 NOTE — Progress Notes (Signed)
Patient returned to unit from dialysis, confused and agitated. Rapid response and MD at bedside. PRN ativan given as documented by rapid nurse. Orders placed. Will continue to monitor.

## 2020-09-01 NOTE — Procedures (Addendum)
Patient Name: Scott Castillo  MRN: [id number]  Epilepsy Attending: Lora Havens  Referring Physician/Provider: Anibal Henderson, NP Date: 09/01/2020 Duration: 23.38 mins  Patient history: 60yo M with episode of confusion during dialysis. EEG to evaluate for seizure  Level of alertness: Awake  AEDs during EEG study: Ativan  Technical aspects: This EEG study was done with scalp electrodes positioned according to the 10-20 International system of electrode placement. Electrical activity was acquired at a sampling rate of 500Hz  and reviewed with a high frequency filter of 70Hz  and a low frequency filter of 1Hz . EEG data were recorded continuously and digitally stored.   Description: No posterior dominant rhythm was seen. EEG showed continuous generalized 3 to 6 Hz theta-delta slowing. Hyperventilation and photic stimulation were not performed.     ABNORMALITY - Continuous slow, generalized  IMPRESSION: This study is suggestive of moderate diffuse encephalopathy, nonspecific etiology. No seizures or epileptiform discharges were seen throughout the recording.   Jerad Dunlap [name]

## 2020-09-01 NOTE — ED Notes (Signed)
Attempted report to inpatient unit. No answer x 2.

## 2020-09-01 NOTE — Progress Notes (Addendum)
Asked to see the patient in HD  Patient had completed 1.5 hours of HD without incident, then suddenly became confused, combative and incoherent. He was trying to get out of bed, was not following commands and took several staff members to hold him down.   On my arrival, patient is laying in bed with legs hanging off the side. He was repositioned in bed. He is not following commands. He is moving all his extremities spontaneously. Appears to have a right gaze.  He received 0.5mg  of ativan and began to settle down.   I suspect that his change in mental status may be related to uremia and dialysis dysequilibrium syndrome. His ammonia level was normal this AM. Blood sugars were also normal. Will check ABG to evaluate PCO2  Discussed with Dr. Rory Percy who will see in consult. Recommended ordering stat CT head without contrast, which I have entered. He will also likely order EEG to evaluate for underlying seizure. Last known normal time around 3:15pm.  Continue with ativan as needed.  His wife was updated by phone  Kathie Dike

## 2020-09-01 NOTE — Progress Notes (Signed)
Received pt alert oriented. V/S taken and assessment before start of HD.  After 1 and 42mins of tx patient became agitated and wants to get out of the bed. Blood sugar was checked 151.  PA was informed. And patient wants to DC tx. PA informed Dr Jonnie Finner and HD was discontinue. After few minutes pt became more aggressive and Rapid response was called. Blood sugar was checked again after 52mins it was 144. Rapid response came and assess the patient. While asking the pt questions he's not able to answer questions. Blood sugar was checked again it was 148. Ativan was given by RR. Attending MD was arrived and assess the pt. Patient nurse from the floor was called and gave the report and explain what's happened to the pt. And pt send back the his room.

## 2020-09-01 NOTE — Progress Notes (Signed)
EEG completed, results pending. 

## 2020-09-01 NOTE — Consult Note (Signed)
Renal Service Consult Note [facility]  [address] 09/01/2020 Sol Blazing, MD Requesting Physician: Dr. Roderic Palau  Reason for Consult: ESRD pt w/ AMS, missed HD HPI: The patient is a 60 y.o. year-old w/ hx of venous stasis, HL, HTN, DM2, ESRD on HD in [place] presented to ED c/o altered mental status after missing HD x 2.  ED w/u showed K 5.7, BUN 119, creat 17. CXR negative, no hypoxemia. Had been having n/v and diarrhea for a week. Asked to see for dialysis.   Pt seen in ED. Has no specific c/o's.  Is fully oriented.   ROS  denies CP  no joint pain   no HA  no blurry vision  no rash  no diarrhea  no nausea/ vomiting   Past Medical History  Past Medical History:  Diagnosis Date  . C7 radiculopathy   . Carpal tunnel syndrome, bilateral    by EMG  R>L  . 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 H/O pancreatic cancer   . HLD (hyperlipidemia)   . HTN (hypertension)   . Lumbar radiculopathy, chronic    hemilamectomy '94  . Venous stasis dermatitis of both lower extremities    Past Surgical History  Past Surgical History:  Procedure Laterality Date  . A/V FISTULAGRAM Left 09/27/2019   Procedure: A/V FISTULAGRAM;  Surgeon: Marty Heck, MD;  Location: [place] [facility];  Service: Cardiovascular;  Laterality: Left;  . AV FISTULA PLACEMENT Left 09/17/2016   Procedure: ARTERIOVENOUS (AV) FISTULA CREATION;  Surgeon: [name], MD;  Location: [place];  Service: Vascular;  Laterality: Left;  . CATARACT EXTRACTION W/ INTRAOCULAR LENS IMPLANT Left 2015  . ENDOVENOUS ABLATION SAPHENOUS VEIN W/ LASER Right 04/08/2016   EVLA R greater saphenous vein by Curt Jews MD  . ENDOVENOUS ABLATION SAPHENOUS VEIN W/ LASER Left 05/13/2016   endovenous laser ablation (left greater saphenous vein) by Curt Jews MD   . FOOT SURGERY    . HEMILAMINOTOMY LUMBAR SPINE  '94  . TONSILLECTOMY     Family History  Family  History  Problem Relation Age of Onset  . Edema Mother   . Heart attack Father   . Heart failure Father   . Heart attack Brother   . Heart attack Brother    Social History  reports that he has never smoked. He has never used smokeless tobacco. He reports that he does not drink alcohol and does not use drugs. Allergies  Allergies  Allergen Reactions  . Heparin Other (See Comments)    Per pt he has episodes where he can't speak  . Pioglitazone Swelling    Site of swelling not recalled by patient  . Adhesive [Tape] Other (See Comments)    Causes redness, pt prefers paper tape   . Latex Rash    Redness, also   Home medications Prior to Admission medications   Medication Sig Start Date End Date Taking? Authorizing Provider  acetaminophen (TYLENOL) 325 MG tablet Take 2 tablets (650 mg total) by mouth every 6 (six) hours as needed for mild pain (or Fever >/= 101). 08/19/20  Yes Domenic Polite, MD  ascorbic acid (VITAMIN C) 1000 MG tablet Take 1,000 mg by mouth daily.   Yes [provider]  carvedilol (COREG) 25 MG tablet Take 25 mg by mouth 2 (two) times daily with a meal.  07/28/15  Yes [provider]  cyanocobalamin 1000 MCG tablet Take 1,000  mcg by mouth daily.   Yes [provider]  ferric citrate (AURYXIA) 1 GM 210 MG(Fe) tablet Take 210-420 mg by mouth See admin instructions. Take 2 tablets (420 mg) by mouth 3 times daily with meals and 1 tablet (210 mg) with snacks   Yes [provider]  Insulin Pen Needle (BD ULTRA-FINE PEN NEEDLES) 29G X 12.7MM MISC 1 each by Other route as directed. 09/22/19  Yes [provider]  irbesartan (AVAPRO) 150 MG tablet Take 150 mg by mouth daily. 09/05/19  Yes [provider]  pravastatin (PRAVACHOL) 80 MG tablet Take 80 mg by mouth every evening.    Yes [provider]  Semaglutide (RYBELSUS) 14 MG TABS Take 14 mg by mouth daily. 10/05/18  Yes [provider]  torsemide (DEMADEX) 100 MG  tablet Take 100 mg by mouth See admin instructions. Takes on Tuesday,Thursday,Saturday and Sunday(non-dialysis days) 02/20/20  Yes [provider]     Vitals:   09/01/20 1318 09/01/20 1330 09/01/20 1400 09/01/20 1430  BP: 138/71 (!) 153/82 134/77 (!) 150/81  Pulse: 68 81    Resp:      Temp:      TempSrc:      SpO2:      Weight:      Height:       Exam Gen alert, no distress, slightly slurred speech No rash, cyanosis or gangrene Sclera anicteric, throat clear  No jvd or bruits Chest clear bilat to bases, no rales/ wheezing RRR no MRG Abd soft ntnd no mass or ascites +bs GU normal MS no joint effusions or deformity Ext 1+ nonpitting bilat pretib edema w/ diffuse skin discoloration below the knees Neuro is alert, Ox 3 , nf  Left forearm AVF+bruit       OP HD: pending   Assessment/ Plan: 1. AMS - mild, hx similar symptoms on prior admission (aphasia like). Possibly uremia related after missing HD x 2.  Plan HD today.  2. ESRD - on HD MWF 3. DM2 - per pmd 4. HTN - bp's are good 5. Volume - no sig vol overload on exam.  6. 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

## 2020-09-01 NOTE — H&P (Signed)
History and Physical    RISHAV ROCKEFELLER LAG:[id number] DOB: [birth year] DOA: 08/31/2020  PCP: Algis Greenhouse, MD Patient coming from: Home  Chief Complaint: AMS  HPI: Kamin Niblack Nez is a 60 y.o. male with medical history significant of ESRD on HD MWF, type 2 diabetes, polyneuropathy, hypertension, hyperlipidemia presented to the ED complaining of "brain fog" after missing dialysis x2.  He was able to answer questions in the ED and did not have any focal neurodeficit.  Blood pressure was elevated with systolic up to 122.  Labs showing WBC 9.3, hemoglobin 10.1 (stable), platelet count 199K.  Sodium 131, potassium 5.7, chloride 96, bicarb 16, BUN 119, creatinine 17.5, glucose 119.  Ammonia within normal range.  COVID and influenza PCR negative.  Chest x-ray negative for acute finding. Patient was given p.o. Coreg. ED provider spoke to Dr. Jonnie Finner who recommended admission and plan for dialysis in the morning.  Patient states he has been ill this past week having vomiting and diarrhea.  He could only tolerate 1 hour of dialysis on Monday and missed dialysis on Wednesday and Friday.  His vomiting has now resolved and he is not having any abdominal pain.  He continues to have diarrhea.  Denies recent antibiotic or laxative use.  Denies fevers, cough, shortness of breath, or chest pain.  No other complaints.  Review of Systems:  All systems reviewed and apart from history of presenting illness, are negative.  Past Medical History:  Diagnosis Date  . C7 radiculopathy   . Carpal tunnel syndrome, bilateral    by EMG  R>L  . 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 H/O pancreatic cancer   . HLD (hyperlipidemia)   . HTN (hypertension)   . Lumbar radiculopathy, chronic    hemilamectomy '94  . Venous stasis dermatitis of both lower extremities     Past Surgical History:  Procedure Laterality Date  . A/V FISTULAGRAM Left 09/27/2019   Procedure: A/V  FISTULAGRAM;  Surgeon: Marty Heck, MD;  Location: [facility];  Service: Cardiovascular;  Laterality: Left;  . AV FISTULA PLACEMENT Left 09/17/2016   Procedure: ARTERIOVENOUS (AV) FISTULA CREATION;  Surgeon: [name], MD;  Location: [facility];  Service: Vascular;  Laterality: Left;  . CATARACT EXTRACTION W/ INTRAOCULAR LENS IMPLANT Left 2015  . ENDOVENOUS ABLATION SAPHENOUS VEIN W/ LASER Right 04/08/2016   EVLA R greater saphenous vein by Curt Jews MD  . ENDOVENOUS ABLATION SAPHENOUS VEIN W/ LASER Left 05/13/2016   endovenous laser ablation (left greater saphenous vein) by Curt Jews MD   . FOOT SURGERY    . HEMILAMINOTOMY LUMBAR SPINE  '94  . TONSILLECTOMY       reports that he has never smoked. He has never used smokeless tobacco. He reports that he does not drink alcohol and does not use drugs.  Allergies  Allergen Reactions  . Heparin Other (See Comments)    Per pt he has episodes where he can't speak  . Pioglitazone Swelling    Site of swelling not recalled by patient  . Adhesive [Tape] Other (See Comments)    Causes redness, pt prefers paper tape   . Latex Rash    Redness, also    Family History  Problem Relation Age of Onset  . Edema Mother   . Heart attack Father   . Heart failure Father   . Heart attack Brother   . Heart attack Brother  Prior to Admission medications   Medication Sig Start Date End Date Taking? Authorizing Provider  acetaminophen (TYLENOL) 325 MG tablet Take 2 tablets (650 mg total) by mouth every 6 (six) hours as needed for mild pain (or Fever >/= 101). 08/19/20  Yes Domenic Polite, MD  ascorbic acid (VITAMIN C) 1000 MG tablet Take 1,000 mg by mouth daily.   Yes [provider]  carvedilol (COREG) 25 MG tablet Take 25 mg by mouth 2 (two) times daily with a meal.  07/28/15  Yes [provider]  cyanocobalamin 1000 MCG tablet Take 1,000 mcg by mouth daily.   Yes [provider]  ferric citrate (AURYXIA) 1  GM 210 MG(Fe) tablet Take 210-420 mg by mouth See admin instructions. Take 2 tablets (420 mg) by mouth 3 times daily with meals and 1 tablet (210 mg) with snacks   Yes [provider]  Insulin Pen Needle (BD ULTRA-FINE PEN NEEDLES) 29G X 12.7MM MISC 1 each by Other route as directed. 09/22/19  Yes [provider]  irbesartan (AVAPRO) 150 MG tablet Take 150 mg by mouth daily. 09/05/19  Yes [provider]  pravastatin (PRAVACHOL) 80 MG tablet Take 80 mg by mouth every evening.    Yes [provider]  Semaglutide (RYBELSUS) 14 MG TABS Take 14 mg by mouth daily. 10/05/18  Yes [provider]  torsemide (DEMADEX) 100 MG tablet Take 100 mg by mouth See admin instructions. Takes on Tuesday,Thursday,Saturday and Sunday(non-dialysis days) 02/20/20  Yes [provider]    Physical Exam: Vitals:   09/01/20 0145 09/01/20 0200 09/01/20 0238 09/01/20 0245  BP: (!) 187/98 (!) 184/91 (!) 204/96 (!) 201/101  Pulse: 92 92 88 93  Resp: 20 14 (!) 24 (!) 22  Temp:      TempSrc:      SpO2: 94% 97% 95% 94%  Weight:      Height:        Physical Exam Constitutional:      General: He is not in acute distress. HENT:     Head: Normocephalic and atraumatic.  Eyes:     Extraocular Movements: Extraocular movements intact.     Conjunctiva/sclera: Conjunctivae normal.  Cardiovascular:     Rate and Rhythm: Normal rate and regular rhythm.     Pulses: Normal pulses.  Pulmonary:     Effort: Pulmonary effort is normal. No respiratory distress.     Breath sounds: Normal breath sounds. No wheezing or rales.  Abdominal:     General: Bowel sounds are normal. There is no distension.     Palpations: Abdomen is soft.     Tenderness: There is no abdominal tenderness.  Musculoskeletal:     Cervical back: Normal range of motion and neck supple.     Right lower leg: Edema present.     Left lower leg: Edema present.     Comments: +1 pitting edema of bilateral lower  extremities  Skin:    General: Skin is warm and dry.     Comments: Chronic venous stasis dermatitis of bilateral lower extremities  Neurological:     General: No focal deficit present.     Mental Status: He is alert and oriented to person, place, and time.     Cranial Nerves: No cranial nerve deficit.     Sensory: No sensory deficit.     Motor: No weakness.     Comments: Slow to respond to questions     Labs on Admission: I have personally reviewed following  labs and imaging studies  CBC: Recent Labs  Lab 08/31/20 2112  WBC 9.3  NEUTROABS 7.3  HGB 10.1*  HCT 29.9*  MCV 90.3  PLT 751   Basic Metabolic Panel: Recent Labs  Lab 08/31/20 2112  NA 131*  K 5.7*  CL 96*  CO2 16*  GLUCOSE 119*  BUN 119*  CREATININE 17.52*  CALCIUM 9.0   GFR: Estimated Creatinine Clearance: 6 mL/min (A) (by C-G formula based on SCr of 17.52 mg/dL (H)). Liver Function Tests: Recent Labs  Lab 08/31/20 2112  AST 13*  ALT 13  ALKPHOS 66  BILITOT 0.9  PROT 7.0  ALBUMIN 3.4*   No results for input(s): LIPASE, AMYLASE in the last 168 hours. Recent Labs  Lab 08/31/20 2341  AMMONIA 16   Coagulation Profile: No results for input(s): INR, PROTIME in the last 168 hours. Cardiac Enzymes: No results for input(s): CKTOTAL, CKMB, CKMBINDEX, TROPONINI in the last 168 hours. BNP (last 3 results) No results for input(s): PROBNP in the last 8760 hours. HbA1C: No results for input(s): HGBA1C in the last 72 hours. CBG: Recent Labs  Lab 09/01/20 0142  GLUCAP 124*   Lipid Profile: No results for input(s): CHOL, HDL, LDLCALC, TRIG, CHOLHDL, LDLDIRECT in the last 72 hours. Thyroid Function Tests: No results for input(s): TSH, T4TOTAL, FREET4, T3FREE, THYROIDAB in the last 72 hours. Anemia Panel: No results for input(s): VITAMINB12, FOLATE, FERRITIN, TIBC, IRON, RETICCTPCT in the last 72 hours. Urine analysis:    Component Value Date/Time   COLORURINE YELLOW 06/16/2020 [place] 06/16/2020 1244   LABSPEC 1.013 06/16/2020 1244   PHURINE 8.0 06/16/2020 1244   GLUCOSEU 150 (A) 06/16/2020 1244   HGBUR SMALL (A) 06/16/2020 [place] 06/16/2020 [place] 06/16/2020 1244   PROTEINUR >=300 (A) 06/16/2020 1244   NITRITE NEGATIVE 06/16/2020 1244   LEUKOCYTESUR TRACE (A) 06/16/2020 1244    Radiological Exams on Admission: DG Chest 2 View  Result Date: 08/31/2020 CLINICAL DATA:  Altered mental status.  Chronic renal failure EXAM: CHEST - 2 VIEW COMPARISON:  August 15, 2020 FINDINGS: No edema or airspace opacity. Heart size and pulmonary vascularity are within normal limits. No adenopathy. IMPRESSION: No edema or airspace opacity. Cardiac silhouette within normal limits. Electronically Signed   By: Lowella Grip III M.D.   On: 08/31/2020 23:38    EKG: Independently reviewed.  Sinus rhythm, no acute changes.  Assessment/Plan Principal Problem:   Encephalopathy Active Problems:   Diabetes mellitus type 2 in obese (HCC)   Hypertensive urgency   ESRD (end stage renal disease) (HCC)   Hyperkalemia, diminished renal excretion   Acute metabolic encephalopathy Likely due to uremia in the setting of missed dialysis.  BUN is significantly elevated.  Patient is slow to respond to questions but no focal neurodeficit.  Not hypoglycemic.  Ammonia level within normal range. -Nephrology has been consulted and planning on taking the patient for dialysis in the morning.  Check TSH level.  ESRD with missed hemodialysis Hyperkalemia Metabolic acidosis Potassium 5.7 without acute EKG changes.  Bicarb 16.  Blood pressure elevated but no signs of volume overload on chest x-ray. -Nephrology has been consulted and planning on taking the patient for dialysis in the morning.  Hypertensive urgency Blood pressure elevated with systolic up to 700. -Continue home Coreg and irbesartan.  IV hydralazine PRN.   Diarrhea Also had nausea and  vomiting earlier this week which have now resolved but he continues  to have diarrhea.  No recent antibiotic or laxative use reported.  No abdominal pain.  No fever or leukocytosis. -GI pathogen panel, enteric precautions  Type 2 diabetes A1c 6.4 on 08/19/2020.  Insulin was discontinued during recent hospitalization due to hypoglycemia. -Very sensitive sliding scale insulin  Hyperlipidemia -Continue pravastatin  DVT prophylaxis: Subcutaneous heparin Code Status: Full code Family Communication: No family available at this time. Disposition Plan: Status is: Observation  The patient remains OBS appropriate and will d/c before 2 midnights.  Dispo: The patient is from: Home              Anticipated d/c is to: Home              Patient currently is not medically stable to d/c.   Difficult to place patient No  Level of care: Level of care: Telemetry Medical   The medical decision making on this patient was of high complexity and the patient is at high risk for clinical deterioration, therefore this is a level 3 visit.  Shela Leff MD [facility]  If 7PM-7AM, please contact night-coverage [email or web address]  09/01/2020, 3:09 AM

## 2020-09-01 NOTE — ED Notes (Signed)
Attempted report to inpatient unit x 3

## 2020-09-01 NOTE — Procedures (Signed)
   I was present at this dialysis session, have reviewed the session itself and made  appropriate changes Kelly Splinter MD [place] pager [phone]   09/01/2020, 2:52 PM

## 2020-09-02 DIAGNOSIS — N2581 Secondary hyperparathyroidism of renal origin: Secondary | ICD-10-CM | POA: Diagnosis present

## 2020-09-02 DIAGNOSIS — I809 Phlebitis and thrombophlebitis of unspecified site: Secondary | ICD-10-CM | POA: Diagnosis not present

## 2020-09-02 DIAGNOSIS — Y838 Other surgical procedures as the cause of abnormal reaction of the patient, or of later complication, without mention of misadventure at the time of the procedure: Secondary | ICD-10-CM | POA: Diagnosis not present

## 2020-09-02 DIAGNOSIS — N186 End stage renal disease: Secondary | ICD-10-CM | POA: Diagnosis present

## 2020-09-02 DIAGNOSIS — G928 Other toxic encephalopathy: Secondary | ICD-10-CM | POA: Diagnosis present

## 2020-09-02 DIAGNOSIS — E669 Obesity, unspecified: Secondary | ICD-10-CM | POA: Diagnosis not present

## 2020-09-02 DIAGNOSIS — D696 Thrombocytopenia, unspecified: Secondary | ICD-10-CM | POA: Diagnosis not present

## 2020-09-02 DIAGNOSIS — E1169 Type 2 diabetes mellitus with other specified complication: Secondary | ICD-10-CM | POA: Diagnosis not present

## 2020-09-02 DIAGNOSIS — Z20822 Contact with and (suspected) exposure to covid-19: Secondary | ICD-10-CM | POA: Diagnosis present

## 2020-09-02 DIAGNOSIS — J810 Acute pulmonary edema: Secondary | ICD-10-CM | POA: Diagnosis not present

## 2020-09-02 DIAGNOSIS — G9340 Encephalopathy, unspecified: Secondary | ICD-10-CM | POA: Diagnosis not present

## 2020-09-02 DIAGNOSIS — E785 Hyperlipidemia, unspecified: Secondary | ICD-10-CM | POA: Diagnosis present

## 2020-09-02 DIAGNOSIS — R4182 Altered mental status, unspecified: Secondary | ICD-10-CM | POA: Diagnosis not present

## 2020-09-02 DIAGNOSIS — R7881 Bacteremia: Secondary | ICD-10-CM | POA: Diagnosis not present

## 2020-09-02 DIAGNOSIS — I808 Phlebitis and thrombophlebitis of other sites: Secondary | ICD-10-CM | POA: Diagnosis not present

## 2020-09-02 DIAGNOSIS — E875 Hyperkalemia: Secondary | ICD-10-CM | POA: Diagnosis present

## 2020-09-02 DIAGNOSIS — E1122 Type 2 diabetes mellitus with diabetic chronic kidney disease: Secondary | ICD-10-CM | POA: Diagnosis present

## 2020-09-02 DIAGNOSIS — B9562 Methicillin resistant Staphylococcus aureus infection as the cause of diseases classified elsewhere: Secondary | ICD-10-CM | POA: Diagnosis not present

## 2020-09-02 DIAGNOSIS — R4701 Aphasia: Secondary | ICD-10-CM | POA: Diagnosis not present

## 2020-09-02 DIAGNOSIS — A4102 Sepsis due to Methicillin resistant Staphylococcus aureus: Secondary | ICD-10-CM | POA: Diagnosis not present

## 2020-09-02 DIAGNOSIS — E872 Acidosis: Secondary | ICD-10-CM | POA: Diagnosis not present

## 2020-09-02 DIAGNOSIS — E1149 Type 2 diabetes mellitus with other diabetic neurological complication: Secondary | ICD-10-CM | POA: Diagnosis present

## 2020-09-02 DIAGNOSIS — E1142 Type 2 diabetes mellitus with diabetic polyneuropathy: Secondary | ICD-10-CM | POA: Diagnosis present

## 2020-09-02 DIAGNOSIS — I120 Hypertensive chronic kidney disease with stage 5 chronic kidney disease or end stage renal disease: Secondary | ICD-10-CM | POA: Diagnosis present

## 2020-09-02 DIAGNOSIS — J9601 Acute respiratory failure with hypoxia: Secondary | ICD-10-CM | POA: Diagnosis not present

## 2020-09-02 DIAGNOSIS — E871 Hypo-osmolality and hyponatremia: Secondary | ICD-10-CM | POA: Diagnosis present

## 2020-09-02 DIAGNOSIS — E8770 Fluid overload, unspecified: Secondary | ICD-10-CM | POA: Diagnosis not present

## 2020-09-02 DIAGNOSIS — R197 Diarrhea, unspecified: Secondary | ICD-10-CM | POA: Diagnosis present

## 2020-09-02 DIAGNOSIS — G9341 Metabolic encephalopathy: Secondary | ICD-10-CM | POA: Diagnosis not present

## 2020-09-02 DIAGNOSIS — I160 Hypertensive urgency: Secondary | ICD-10-CM | POA: Diagnosis not present

## 2020-09-02 DIAGNOSIS — R6520 Severe sepsis without septic shock: Secondary | ICD-10-CM | POA: Diagnosis not present

## 2020-09-02 DIAGNOSIS — I872 Venous insufficiency (chronic) (peripheral): Secondary | ICD-10-CM | POA: Diagnosis present

## 2020-09-02 DIAGNOSIS — T82898A Other specified complication of vascular prosthetic devices, implants and grafts, initial encounter: Secondary | ICD-10-CM | POA: Diagnosis not present

## 2020-09-02 LAB — CBC
HCT: 25.5 % — ABNORMAL LOW (ref 39.0–52.0)
HCT: 25.7 % — ABNORMAL LOW (ref 39.0–52.0)
Hemoglobin: 8.6 g/dL — ABNORMAL LOW (ref 13.0–17.0)
Hemoglobin: 8.5 g/dL — ABNORMAL LOW (ref 13.0–17.0)
MCH: 30.3 pg (ref 26.0–34.0)
MCH: 29.9 pg (ref 26.0–34.0)
MCHC: 33.7 g/dL (ref 30.0–36.0)
MCHC: 33.1 g/dL (ref 30.0–36.0)
MCV: 89.8 fL (ref 80.0–100.0)
MCV: 90.5 fL (ref 80.0–100.0)
Platelets: 162 10*3/uL (ref 150–400)
Platelets: 188 10*3/uL (ref 150–400)
RBC: 2.84 MIL/uL — ABNORMAL LOW (ref 4.22–5.81)
RBC: 2.84 MIL/uL — ABNORMAL LOW (ref 4.22–5.81)
RDW: 13 % (ref 11.5–15.5)
RDW: 12.9 % (ref 11.5–15.5)
WBC: 5.7 10*3/uL (ref 4.0–10.5)
WBC: 5.7 10*3/uL (ref 4.0–10.5)
nRBC: 0 % (ref 0.0–0.2)
nRBC: 0 % (ref 0.0–0.2)

## 2020-09-02 LAB — GLUCOSE, CAPILLARY
Glucose-Capillary: 99 mg/dL (ref 70–99)
Glucose-Capillary: 141 mg/dL — ABNORMAL HIGH (ref 70–99)
Glucose-Capillary: 169 mg/dL — ABNORMAL HIGH (ref 70–99)
Glucose-Capillary: 149 mg/dL — ABNORMAL HIGH (ref 70–99)

## 2020-09-02 LAB — BASIC METABOLIC PANEL
Anion gap: 14 (ref 5–15)
BUN: 96 mg/dL — ABNORMAL HIGH (ref 6–20)
CO2: 21 mmol/L — ABNORMAL LOW (ref 22–32)
Calcium: 8.3 mg/dL — ABNORMAL LOW (ref 8.9–10.3)
Chloride: 98 mmol/L (ref 98–111)
Creatinine, Ser: 14.46 mg/dL — ABNORMAL HIGH (ref 0.61–1.24)
GFR, Estimated: 4 mL/min — ABNORMAL LOW (ref >60)
Glucose, Bld: 105 mg/dL — ABNORMAL HIGH (ref 70–99)
Potassium: 4.7 mmol/L (ref 3.5–5.1)
Sodium: 133 mmol/L — ABNORMAL LOW (ref 135–145)

## 2020-09-02 MED ORDER — SODIUM CHLORIDE 0.9 % IV SOLN: 100.0000 mL | INTRAVENOUS | Status: DC | PRN

## 2020-09-02 MED ORDER — DARBEPOETIN ALFA 60 MCG/0.3ML IJ SOSY
60.0000 ug | PREFILLED_SYRINGE | INTRAMUSCULAR | Status: DC
  Filled 2020-09-02: qty 0.3

## 2020-09-02 MED ORDER — LIDOCAINE-PRILOCAINE 2.5-2.5 % EX CREA
1.0000 "application " | TOPICAL_CREAM | CUTANEOUS | Status: DC | PRN
  Filled 2020-09-02: qty 5

## 2020-09-02 MED ORDER — LIDOCAINE HCL (PF) 1 % IJ SOLN
5.0000 mL | INTRAMUSCULAR | Status: DC | PRN
  Filled 2020-09-02: qty 5

## 2020-09-02 MED ORDER — ALTEPLASE 2 MG IJ SOLR: 2.0000 mg | Freq: Once | INTRAMUSCULAR | Status: DC | PRN

## 2020-09-02 MED ORDER — FERRIC CITRATE 1 GM 210 MG(FE) PO TABS
420.0000 mg | ORAL_TABLET | Freq: Three times a day (TID) | ORAL | Status: DC
  Administered 2020-09-02 – 2020-09-04 (×7): 420 mg via ORAL
  Filled 2020-09-02 (×8): qty 2

## 2020-09-02 MED ORDER — PENTAFLUOROPROP-TETRAFLUOROETH EX AERO: 1.0000 "application " | INHALATION_SPRAY | CUTANEOUS | Status: DC | PRN

## 2020-09-02 MED ORDER — CALCITRIOL 0.5 MCG PO CAPS
2.2500 ug | ORAL_CAPSULE | ORAL | Status: DC
  Administered 2020-09-05 – 2020-09-08 (×2): 2.25 ug via ORAL
  Filled 2020-09-02: qty 1

## 2020-09-02 NOTE — Progress Notes (Addendum)
[facility] Progress Note  Subjective: looks back to baseline today. He had a very hard time w/ HD yest, rx was shortened. Today he remembers the event , severe "anxiety". Has had a few times before at OP HD but that is not usual for him.         Vitals:   09/01/20 2200 09/02/20 0355 09/02/20 0801 09/02/20 1214  BP:  (!) 150/90 (!) 152/82 (!) 152/84  Pulse:  77 68 67  Resp:  18 20 18   Temp: 98.2 F (36.8 C) 97.9 F (36.6 C) 97.6 F (36.4 C) 97.6 F (36.4 C)  TempSrc:  Oral Oral Axillary  SpO2:  92% 98% 94%  Weight:      Height:        Exam:   alert, nad   no jvd  Chest cta bilat  Cor reg no RG  Abd soft ntnd no ascites   Ext no LE edema   Alert, NF, ox3   LFA AVF+bruit    OP HD: MWF [facility]  4h 33min   400/500  111.5kg  2/2 bath P2 Hep none  - venofer 50 /wk  - mircera 75 q4, last 4/20 due 5/18  - calcitriol 2.25 ug tiw  - home meds: auryxia 2 ac tid  Assessment/ Plan: 1. AMS - presumed uremia d/t missed outpatient HD. Had severe "anxiety" on HD yest, suspect dialysis dysequilibrium given high BUN/ Cr and aggressive HD settings. Back to baseline today. Will plan daily gentle HD 2.5h for the next 2-3 days to get azotemia down hopefully w/o any further reactions.  2. ESRD - HD MWF. HD daily x 2.5 each until azotemia resolved.  3. HTN/vol  - BP's stable, 1-2 kg up, mild edema LE's 4. Anemia ckd - next esa due Wed, will order darbe 60ug weekly while here.  5. MBD ckd - CorrCa/ P are 9.3/ 9.1. Cont vdra, binder     Rob Marvyn Torrez 09/02/2020, 12:31 PM   Last Labs       Recent Labs  Lab 09/01/20 1758 09/02/20 0134  K 4.6  4.5 4.7  BUN 89*  90* 96*  CREATININE 14.17*  14.27* 14.46*  CALCIUM 8.5*  8.5* 8.3*  PHOS 9.1*  --   HGB 9.0* 8.6*     Inpatient medications: . carvedilol  25 mg Oral BID WC  . Chlorhexidine Gluconate Cloth  6 each Topical Q0600  . heparin  5,000 Units Subcutaneous Q8H  . insulin aspart  0-5  Units Subcutaneous QHS  . insulin aspart  0-6 Units Subcutaneous TID WC  . irbesartan  150 mg Oral Daily  . mupirocin ointment  1 application Nasal BID  . pravastatin  80 mg Oral QPM   . sodium chloride    . sodium chloride     sodium chloride, sodium chloride, acetaminophen **OR** acetaminophen, alteplase, hydrALAZINE, lidocaine (PF), lidocaine-prilocaine, loperamide, LORazepam, pentafluoroprop-tetrafluoroeth

## 2020-09-02 NOTE — Progress Notes (Signed)
PROGRESS NOTE    Scott Castillo  XAJ:[id number] DOB: 1961/03/27 DOA: 08/31/2020 PCP: Algis Greenhouse, MD    Brief Narrative:  60 yo male with history of DM, HTN, ESRD MWF, came to hospital due to missing dialysis and feeling foggy. He was noted to be significantly uremic and was admitted for dialysis. During dialysis, he became acutely agitated and could not complete more than 1.5 hours of treatment. Neurology and nephrology following. 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   Assessment & Plan:   Principal Problem:   Encephalopathy Active Problems:   Diabetes mellitus type 2 in obese (HCC)   Hypertensive urgency   ESRD (end stage renal disease) (HCC)   Hyperkalemia, diminished renal excretion   Acute metabolic encephalopathy -patient became acutely agitated during dialysis yesterday -CT head negative -EEG did not show epileptiform discharges -similar spells in the past with dialysis when he was admitted last month -suspect uremia with dialysis dysequilibrium syndrome -appreciate neurology input  ESRD on HD MWF -normally gets dialysis at [facility] in [place] -patient missed last 2 HD sessions on Wed and Fri -last session was last Monday and only completed an hour of that session -he had approximately 1.5 hours of HD yesterday, but session had to be discontinued due to mental status -BUN remains significantly elevated this morning -appreciate nephrology input -defer to renal regarding further dialysis attempts  Hyperkalemia -improved  Hypertensive urgency -blood pressure up to 200s on admission -continues on coreg and irbesartan -blood pressures currently stable  Diarrhea -suspect viral gastroenteritis process -no recent antibiotics -no reported fevers -nausea vomiting now resolved -C diff negative -GI pathogen panel negative -treat supportively with antidiarrheals  Type 2 DM -A1c 6.4 -continue on SSI -blood sugars stable  HLD -continue  on statin   DVT prophylaxis: heparin injection 5,000 Units Start: 09/01/20 0600  Code Status: full code Family Communication: discussed with wife over the phone Disposition Plan: Status is: Observation  The patient remains OBS appropriate and will d/c before 2 midnights.  Dispo: The patient is from: Home              Anticipated d/c is to: Home              Patient currently is not medically stable to d/c.   Difficult to place patient No         Consultants:   Nephrology  Neurology  Procedures:     Antimicrobials:       Subjective: Patient is awake and alert today. He remembers going on dialysis yesterday, but does not recall what happened after that  Objective: Vitals:   09/01/20 2025 09/01/20 2200 09/02/20 0355 09/02/20 0801  BP: (!) 174/92 (!) 148/80 (!) 150/90 (!) 152/82  Pulse: 79 80 77 68  Resp: 18 18 18 20   Temp: 98.4 F (36.9 C) 98.2 F (36.8 C) 97.9 F (36.6 C) 97.6 F (36.4 C)  TempSrc: Oral Oral Oral Oral  SpO2: 95% 95% 92% 98%  Weight:      Height:        Intake/Output Summary (Last 24 hours) at 09/02/2020 1045 Last data filed at 09/02/2020 0612 Gross per 24 hour  Intake 540 ml  Output 1700 ml  Net -1160 ml   Filed Weights   08/31/20 2116 09/01/20 1305 09/01/20 1455  Weight: 113.9 kg 114.6 kg 113.5 kg    Examination:  General exam: Appears calm and comfortable  Respiratory system: Clear to auscultation. Respiratory effort normal. Cardiovascular system: S1 & S2 heard,  RRR. No JVD, murmurs, rubs, gallops or clicks. No pedal edema. Gastrointestinal system: Abdomen is nondistended, soft and nontender. No organomegaly or masses felt. Normal bowel sounds heard. Central nervous system: Alert and oriented. No focal neurological deficits. Extremities: Symmetric 5 x 5 power. Skin: No rashes, lesions or ulcers Psychiatry: Judgement and insight appear normal. Mood & affect appropriate.     Data Reviewed: I have personally reviewed  following labs and imaging studies  CBC: Recent Labs  Lab 08/31/20 2112 09/01/20 1758 09/02/20 0134  WBC 9.3 5.9 5.7  NEUTROABS 7.3  --   --   HGB 10.1* 9.0* 8.6*  HCT 29.9* 25.8* 25.5*  MCV 90.3 89.0 89.8  PLT 199 176 387   Basic Metabolic Panel: Recent Labs  Lab 08/31/20 2112 09/01/20 1758 09/02/20 0134  NA 131* 134*  133* 133*  K 5.7* 4.6  4.5 4.7  CL 96* 96*  96* 98  CO2 16* 22  22 21*  GLUCOSE 119* 157*  157* 105*  BUN 119* 89*  90* 96*  CREATININE 17.52* 14.17*  14.27* 14.46*  CALCIUM 9.0 8.5*  8.5* 8.3*  PHOS  --  9.1*  --    GFR: Estimated Creatinine Clearance: 7.2 mL/min (A) (by C-G formula based on SCr of 14.46 mg/dL (H)). Liver Function Tests: Recent Labs  Lab 08/31/20 2112 09/01/20 1758  AST 13*  --   ALT 13  --   ALKPHOS 66  --   BILITOT 0.9  --   PROT 7.0  --   ALBUMIN 3.4* 2.9*   No results for input(s): LIPASE, AMYLASE in the last 168 hours. Recent Labs  Lab 08/31/20 2341  AMMONIA 16   Coagulation Profile: No results for input(s): INR, PROTIME in the last 168 hours. Cardiac Enzymes: No results for input(s): CKTOTAL, CKMB, CKMBINDEX, TROPONINI in the last 168 hours. BNP (last 3 results) No results for input(s): PROBNP in the last 8760 hours. HbA1C: No results for input(s): HGBA1C in the last 72 hours. CBG: Recent Labs  Lab 09/01/20 1354 09/01/20 1440 09/01/20 1536 09/01/20 2022 09/02/20 0800  GLUCAP 151* 144* 148* 118* 99   Lipid Profile: No results for input(s): CHOL, HDL, LDLCALC, TRIG, CHOLHDL, LDLDIRECT in the last 72 hours. Thyroid Function Tests: Recent Labs    09/01/20 0530  TSH 0.473   Anemia Panel: No results for input(s): VITAMINB12, FOLATE, FERRITIN, TIBC, IRON, RETICCTPCT in the last 72 hours. Sepsis Labs: No results for input(s): PROCALCITON, LATICACIDVEN in the last 168 hours.  Recent Results (from the past 240 hour(s))  Resp Panel by RT-PCR (Flu A&B, Covid) Nasopharyngeal Swab     Status: None    Collection Time: 08/31/20 11:43 PM   Specimen: Nasopharyngeal Swab; Nasopharyngeal(NP) swabs in vial transport medium  Result Value Ref Range Status   SARS Coronavirus 2 by RT PCR NEGATIVE NEGATIVE Final    Comment: (NOTE) SARS-CoV-2 target nucleic acids are NOT DETECTED.  The SARS-CoV-2 RNA is generally detectable in upper respiratory specimens during the acute phase of infection. The lowest concentration of SARS-CoV-2 viral copies this assay can detect is 138 copies/mL. A negative result does not preclude SARS-Cov-2 infection and should not be used as the sole basis for treatment or other patient management decisions. A negative result may occur with  improper specimen collection/handling, submission of specimen other than nasopharyngeal swab, presence of viral mutation(s) within the areas targeted by this assay, and inadequate number of viral copies(<138 copies/mL). A negative result must be combined with clinical observations,  patient history, and epidemiological information. The expected result is Negative.  Fact Sheet for Patients:  EntrepreneurPulse.com.au  Fact Sheet for Healthcare Providers:  IncredibleEmployment.be  This test is no t yet approved or cleared by the [place] FDA and  has been authorized for detection and/or diagnosis of SARS-CoV-2 by FDA under an Emergency Use Authorization (EUA). This EUA will remain  in effect (meaning this test can be used) for the duration of the COVID-19 declaration under Section 564(b)(1) of the Act, 21 U.S.C.section 360bbb-3(b)(1), unless the authorization is terminated  or revoked sooner.       Influenza A by PCR NEGATIVE NEGATIVE Final   Influenza B by PCR NEGATIVE NEGATIVE Final    Comment: (NOTE) The Xpert Xpress SARS-CoV-2/FLU/RSV plus assay is intended as an aid in the diagnosis of influenza from Nasopharyngeal swab specimens and should not be used as a sole basis for treatment.  Nasal washings and aspirates are unacceptable for Xpert Xpress SARS-CoV-2/FLU/RSV testing.  Fact Sheet for Patients: EntrepreneurPulse.com.au  Fact Sheet for Healthcare Providers: IncredibleEmployment.be  This test is not yet approved or cleared by the [place] FDA and has been authorized for detection and/or diagnosis of SARS-CoV-2 by FDA under an Emergency Use Authorization (EUA). This EUA will remain in effect (meaning this test can be used) for the duration of the COVID-19 declaration under Section 564(b)(1) of the Act, 21 U.S.C. section 360bbb-3(b)(1), unless the authorization is terminated or revoked.  Performed at [facility], [place] [address] [place], [place] [address]   Gastrointestinal Panel by PCR , Stool     Status: None   Collection Time: 09/01/20  4:05 AM   Specimen: Stool  Result Value Ref Range Status   Campylobacter species NOT DETECTED NOT DETECTED Final   Plesimonas shigelloides NOT DETECTED NOT DETECTED Final   Salmonella species NOT DETECTED NOT DETECTED Final   Yersinia enterocolitica NOT DETECTED NOT DETECTED Final   Vibrio species NOT DETECTED NOT DETECTED Final   Vibrio cholerae NOT DETECTED NOT DETECTED Final   Enteroaggregative E coli (EAEC) NOT DETECTED NOT DETECTED Final   Enteropathogenic E coli (EPEC) NOT DETECTED NOT DETECTED Final   Enterotoxigenic E coli (ETEC) NOT DETECTED NOT DETECTED Final   Shiga like toxin producing E coli (STEC) NOT DETECTED NOT DETECTED Final   Shigella/Enteroinvasive E coli (EIEC) NOT DETECTED NOT DETECTED Final   Cryptosporidium NOT DETECTED NOT DETECTED Final   Cyclospora cayetanensis NOT DETECTED NOT DETECTED Final   Entamoeba histolytica NOT DETECTED NOT DETECTED Final   Giardia lamblia NOT DETECTED NOT DETECTED Final   Adenovirus F40/41 NOT DETECTED NOT DETECTED Final   Astrovirus NOT DETECTED NOT DETECTED Final   Norovirus GI/GII NOT DETECTED NOT DETECTED Final    Rotavirus A NOT DETECTED NOT DETECTED Final   Sapovirus (I, II, IV, and V) NOT DETECTED NOT DETECTED Final    Comment: Performed at [facility], [address] [place], [place] [address]  C Difficile Quick Screen w PCR reflex     Status: None   Collection Time: 09/01/20 12:03 PM   Specimen: STOOL  Result Value Ref Range Status   C Diff antigen NEGATIVE NEGATIVE Final   C Diff toxin NEGATIVE NEGATIVE Final   C Diff interpretation No C. difficile detected.  Final    Comment: Performed at [facility], [place] [address], [place], [place] [address]  MRSA PCR Screening     Status: Abnormal   Collection Time: 09/01/20 12:15 PM  Specimen: Nasopharyngeal  Result Value Ref Range Status   MRSA by PCR POSITIVE (A) NEGATIVE Final    Comment:        The GeneXpert MRSA Assay (FDA approved for NASAL specimens only), is one component of a comprehensive MRSA colonization surveillance program. It is not intended to diagnose MRSA infection nor to guide or monitor treatment for MRSA infections. RESULT CALLED TO, READ BACK BY AND VERIFIED WITH: A CREWS RN [id number] 09/01/20 A BROWNING Performed at [facility], [place] [address], [place], [place] [address]          Radiology Studies: DG Chest 2 View  Result Date: 08/31/2020 CLINICAL DATA:  Altered mental status.  Chronic renal failure EXAM: CHEST - 2 VIEW COMPARISON:  August 15, 2020 FINDINGS: No edema or airspace opacity. Heart size and pulmonary vascularity are within normal limits. No adenopathy. IMPRESSION: No edema or airspace opacity. Cardiac silhouette within normal limits. Electronically Signed   By: Lowella Grip III M.D.   On: 08/31/2020 23:38   CT HEAD WO CONTRAST  Result Date: 09/01/2020 CLINICAL DATA:  Altered mental status. EXAM: CT HEAD WITHOUT CONTRAST TECHNIQUE: Contiguous axial images were obtained from the base of the skull through the vertex without intravenous contrast. COMPARISON:  June 16, 2020  FINDINGS: Brain: There is moderate severity cerebral atrophy with widening of the extra-axial spaces and ventricular dilatation. This is stable in severity and greater than expected for the patient's age. There are areas of decreased attenuation within the white matter tracts of the supratentorial brain, consistent with microvascular disease changes. Vascular: No hyperdense vessel or unexpected calcification. Skull: Normal. Negative for fracture or focal lesion. Sinuses/Orbits: No acute finding. Other: None. IMPRESSION: 1. Generalized cerebral atrophy. 2. No acute intracranial abnormality. Electronically Signed   By: Virgina Norfolk M.D.   On: 09/01/2020 23:29   EEG adult  Result Date: 09/01/2020 Lora Havens, MD     09/01/2020  6:17 PM Patient Name: Scott Castillo MRN: [id number] Epilepsy Attending: Lora Havens Referring Physician/Provider: Anibal Henderson, NP Date: 09/01/2020 Duration: 23.38 mins Patient history: 59yo M with episode of confusion during dialysis. EEG to evaluate for seizure Level of alertness: Awake AEDs during EEG study: Ativan Technical aspects: This EEG study was done with scalp electrodes positioned according to the 10-20 International system of electrode placement. Electrical activity was acquired at a sampling rate of 500Hz  and reviewed with a high frequency filter of 70Hz  and a low frequency filter of 1Hz . EEG data were recorded continuously and digitally stored. Description: No posterior dominant rhythm was seen. EEG showed continuous generalized 3 to 6 Hz theta-delta slowing. Hyperventilation and photic stimulation were not performed.   ABNORMALITY - Continuous slow, generalized IMPRESSION: This study is suggestive of moderate diffuse encephalopathy, nonspecific etiology. No seizures or epileptiform discharges were seen throughout the recording. Priyanka [name]        Scheduled Meds: . carvedilol  25 mg Oral BID WC  . Chlorhexidine Gluconate Cloth  6 each Topical  Q0600  . heparin  5,000 Units Subcutaneous Q8H  . insulin aspart  0-5 Units Subcutaneous QHS  . insulin aspart  0-6 Units Subcutaneous TID WC  . irbesartan  150 mg Oral Daily  . mupirocin ointment  1 application Nasal BID  . pravastatin  80 mg Oral QPM   Continuous Infusions:   LOS: 0 days    Time spent: 68mins    Kathie Dike, MD [facility]   If 7PM-7AM, please contact  night-coverage [email or web address]  09/02/2020, 10:45 AM \

## 2020-09-02 NOTE — Progress Notes (Signed)
Neurology Progress Note  Brief HPI: 60 y.o. male with PMHx of ESRD on HD MWF, DM, HTN, HLD, anemia of chronic disease, and pancreatic cancer who has had multiple admissions for evaluation of confusion, agitation, and aphasia during dialysis (Feb 2022, Sep 01, 2020) with multiple reports of aphasia following HD at home per wife lasting 5-6 hours after HD concerning for stroke verus seizure versus dialysis disequilibrium syndrome.  Subjective: No acute overnight events Mental status much improved since evaluation yesterday to patient's baseline Patient was able to provide some further information stating that he has been unable to attend dialysis sessions this week due to vomiting and diarrhea. He states that he presents with aphasia and agitation more often when he misses dialysis because "it is harder on my body when I have missed". He does endorse having 1 - 2 "episodes" at home on days that are not dialysis days. He recalls the events of yesterday and states that his agitation and anxiety were due to his aphasia and he was attempting to tell staff to stop his dialysis session.   Exam: Vitals:   09/02/20 0355 09/02/20 0801  BP: (!) 150/90 (!) 152/82  Pulse: 77 68  Resp: 18 20  Temp: 97.9 F (36.6 C) 97.6 F (36.4 C)  SpO2: 92% 98%   Gen: Sleeping in bed, in no acute distress Resp: non-labored breathing on nasal cannula with SPO2 of 96% during examination Abd: soft, non-tender, non-distended  NEURO:  Mental Status: Awake, alert, able to state name, age, and year. He is able to provide some clear and coherent history of present illness. He is unable to state the month.  Speech/Language: speech is intact without dysarthria but he is slow to answer most orientation questions and does still have some trouble with word finding. Naming, repetition, and comprehension are intact. No neglect noted.  Cranial Nerves:  II: PERRL 4 mm/brisk. Visual fields are full. III, IV, VI: EOMI without  ptosis V: Facial sensation to light touch is intact and symmetric VII: Face is symmetric resting and smiling.  VIII: Hearing is intact to voice IX, X: Palate elevation is symmetric. Phonation normal.  XI: Phonation normal, palate elevates symmetrically. XII: Tongue protrudes midline without fasciculations.   Motor: 5/5 strength present in bilateral upper and lower extremities without vertical drift on assessment.  Tone and bulk are normal.  Sensation: Sensation to light touch is intact and symmetric in bilateral upper and lower extremities Coordination: FNF intact bilaterally  Pertinent Labs: CBC    Component Value Date/Time   WBC 5.7 09/02/2020 0134   RBC 2.84 (L) 09/02/2020 0134   HGB 8.6 (L) 09/02/2020 0134   HCT 25.5 (L) 09/02/2020 0134   PLT 162 09/02/2020 0134   MCV 89.8 09/02/2020 0134   MCH 30.3 09/02/2020 0134   MCHC 33.7 09/02/2020 0134   RDW 13.0 09/02/2020 0134   LYMPHSABS 0.8 08/31/2020 2112   MONOABS 0.9 08/31/2020 2112   EOSABS 0.2 08/31/2020 2112   BASOSABS 0.0 08/31/2020 2112   CMP     Component Value Date/Time   NA 133 (L) 09/02/2020 0134   K 4.7 09/02/2020 0134   CL 98 09/02/2020 0134   CO2 21 (L) 09/02/2020 0134   GLUCOSE 105 (H) 09/02/2020 0134   BUN 96 (H) 09/02/2020 0134   CREATININE 14.46 (H) 09/02/2020 0134   CALCIUM 8.3 (L) 09/02/2020 0134   PROT 7.0 08/31/2020 2112   ALBUMIN 2.9 (L) 09/01/2020 1758   AST 13 (L) 08/31/2020 2112  ALT 13 08/31/2020 2112   ALKPHOS 66 08/31/2020 2112   BILITOT 0.9 08/31/2020 2112   GFRNONAA 4 (L) 09/02/2020 0134   GFRAA 14 (L) 07/06/2016 0502   Imaging I have reviewed the images obtained:  5/16 CT Head: 1. Generalized cerebral atrophy. 2. No acute intracranial abnormality.  5/16 EEG: "This study is suggestive of moderate diffuse encephalopathy, nonspecific etiology. No seizures or epileptiform discharges were seen throughout the recording."  Assessment: 60 y.o.malewith PMHx of ESRD on HD, diabetes  mellitus type 2, HLD, anemia of chronic disease, venous stasis dermatitis of both lower extremities presented with "brain fog" and became acutely altered with expressive aphasia during inpatient hemodialysis on 5/16. - On chart review, in February wife noted that patient has had these episodes of expressive aphasia and confusion after his dialysis sessions that takes around 5 hours for him to return to his baseline. -  Initial lab work in the ED revealed a WBC 9.3, potassium 5.7, bicarb 16, BUN 119, and creatinine of 17.5 (with Cr of ~ 7.3 14 days ago).  - Initial neurology evaluation following 1.5 hours of HD showed globally aphasic patient that was agitated and restless and it was felt that he was unsafe to undergo CT at that time. He was thrashing all extremities equally and perseverating on stating "please" without further usable speech. On reassessment 5/17 patient is calm, cooperative, and oriented. He has minimal word finding difficulties but does recall events from yesterday and states "I had another episode" - STAT EEG without seizures or epileptiform discharges, though it was obtained after Ativan administration -Transient hypoperfusion might be leading these repetitive episodes post hemodialysis with presenting blood pressure of > 200 and blood pressure on rapid response arrival was in the 321'Y systolic.  - DDx also includes disequilibrium syndrome(DDS)as a rare but serious complication of hemodialysis. It is usually producing neurological symptoms such as headaches, nausea, altered mental status, convulsions. Due to rapid return to baseline, it is felt that DDS is the most likely etiology for encephalopathy and aphasia during dialysis.   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 Agree with the history and physical as documented above. Agree with the plan as documented, which I helped formulate. I have independently reviewed the chart, obtained history, review of systems and examined the patient.I have personally reviewed pertinent head/neck/spine imaging (CT/MRI). Please feel free to call with any questions.  -- Amie Portland, MD Neurologist [facility] Pager: [phone]

## 2020-09-03 ENCOUNTER — Inpatient Hospital Stay (HOSPITAL_COMMUNITY): Payer: Medicare Other

## 2020-09-03 DIAGNOSIS — E875 Hyperkalemia: Secondary | ICD-10-CM | POA: Diagnosis not present

## 2020-09-03 DIAGNOSIS — J9601 Acute respiratory failure with hypoxia: Secondary | ICD-10-CM | POA: Diagnosis not present

## 2020-09-03 DIAGNOSIS — G9341 Metabolic encephalopathy: Secondary | ICD-10-CM

## 2020-09-03 DIAGNOSIS — E878 Other disorders of electrolyte and fluid balance, not elsewhere classified: Secondary | ICD-10-CM

## 2020-09-03 DIAGNOSIS — I160 Hypertensive urgency: Secondary | ICD-10-CM

## 2020-09-03 DIAGNOSIS — E1169 Type 2 diabetes mellitus with other specified complication: Secondary | ICD-10-CM | POA: Diagnosis not present

## 2020-09-03 DIAGNOSIS — R4182 Altered mental status, unspecified: Secondary | ICD-10-CM

## 2020-09-03 DIAGNOSIS — N186 End stage renal disease: Secondary | ICD-10-CM | POA: Diagnosis not present

## 2020-09-03 LAB — CBC WITH DIFFERENTIAL/PLATELET
Abs Immature Granulocytes: 0.01 10*3/uL (ref 0.00–0.07)
Basophils Absolute: 0 10*3/uL (ref 0.0–0.1)
Basophils Relative: 1 %
Eosinophils Absolute: 0.2 10*3/uL (ref 0.0–0.5)
Eosinophils Relative: 3 %
HCT: 27.1 % — ABNORMAL LOW (ref 39.0–52.0)
Hemoglobin: 9.2 g/dL — ABNORMAL LOW (ref 13.0–17.0)
Immature Granulocytes: 0 %
Lymphocytes Relative: 8 %
Lymphs Abs: 0.5 10*3/uL — ABNORMAL LOW (ref 0.7–4.0)
MCH: 30.9 pg (ref 26.0–34.0)
MCHC: 33.9 g/dL (ref 30.0–36.0)
MCV: 90.9 fL (ref 80.0–100.0)
Monocytes Absolute: 0.6 10*3/uL (ref 0.1–1.0)
Monocytes Relative: 9 %
Neutro Abs: 5.4 10*3/uL (ref 1.7–7.7)
Neutrophils Relative %: 79 %
Platelets: 230 10*3/uL (ref 150–400)
RBC: 2.98 MIL/uL — ABNORMAL LOW (ref 4.22–5.81)
RDW: 12.8 % (ref 11.5–15.5)
WBC: 6.8 10*3/uL (ref 4.0–10.5)
nRBC: 0 % (ref 0.0–0.2)

## 2020-09-03 LAB — COMPREHENSIVE METABOLIC PANEL
ALT: 12 U/L (ref 0–44)
AST: 11 U/L — ABNORMAL LOW (ref 15–41)
Albumin: 3 g/dL — ABNORMAL LOW (ref 3.5–5.0)
Alkaline Phosphatase: 50 U/L (ref 38–126)
Anion gap: 14 (ref 5–15)
BUN: 76 mg/dL — ABNORMAL HIGH (ref 6–20)
CO2: 21 mmol/L — ABNORMAL LOW (ref 22–32)
Calcium: 8.5 mg/dL — ABNORMAL LOW (ref 8.9–10.3)
Chloride: 97 mmol/L — ABNORMAL LOW (ref 98–111)
Creatinine, Ser: 12.66 mg/dL — ABNORMAL HIGH (ref 0.61–1.24)
GFR, Estimated: 4 mL/min — ABNORMAL LOW (ref >60)
Glucose, Bld: 154 mg/dL — ABNORMAL HIGH (ref 70–99)
Potassium: 4.7 mmol/L (ref 3.5–5.1)
Sodium: 132 mmol/L — ABNORMAL LOW (ref 135–145)
Total Bilirubin: 0.7 mg/dL (ref 0.3–1.2)
Total Protein: 6.3 g/dL — ABNORMAL LOW (ref 6.5–8.1)

## 2020-09-03 LAB — GLUCOSE, CAPILLARY
Glucose-Capillary: 108 mg/dL — ABNORMAL HIGH (ref 70–99)
Glucose-Capillary: 159 mg/dL — ABNORMAL HIGH (ref 70–99)
Glucose-Capillary: 156 mg/dL — ABNORMAL HIGH (ref 70–99)
Glucose-Capillary: 131 mg/dL — ABNORMAL HIGH (ref 70–99)
Glucose-Capillary: 143 mg/dL — ABNORMAL HIGH (ref 70–99)
Glucose-Capillary: 155 mg/dL — ABNORMAL HIGH (ref 70–99)

## 2020-09-03 LAB — PROCALCITONIN: Procalcitonin: 1.82 ng/mL

## 2020-09-03 LAB — PHOSPHORUS: Phosphorus: 9.2 mg/dL — ABNORMAL HIGH (ref 2.5–4.6)

## 2020-09-03 LAB — MAGNESIUM: Magnesium: 2.3 mg/dL (ref 1.7–2.4)

## 2020-09-03 MED ORDER — CALCITRIOL 0.5 MCG PO CAPS
ORAL_CAPSULE | ORAL | Status: AC
  Filled 2020-09-03: qty 4

## 2020-09-03 MED ORDER — LABETALOL HCL 5 MG/ML IV SOLN: 10.0000 mg | Freq: Once | INTRAVENOUS | Status: AC

## 2020-09-03 MED ORDER — VANCOMYCIN VARIABLE DOSE PER UNSTABLE RENAL FUNCTION (PHARMACIST DOSING): Status: DC

## 2020-09-03 MED ORDER — ACETAMINOPHEN 325 MG PO TABS
ORAL_TABLET | ORAL | Status: AC
  Filled 2020-09-03: qty 2

## 2020-09-03 MED ORDER — DARBEPOETIN ALFA 60 MCG/0.3ML IJ SOSY
PREFILLED_SYRINGE | INTRAMUSCULAR | Status: AC
  Administered 2020-09-03: 60 ug via INTRAVENOUS
  Filled 2020-09-03: qty 0.3

## 2020-09-03 MED ORDER — PANTOPRAZOLE SODIUM 40 MG PO TBEC
40.0000 mg | DELAYED_RELEASE_TABLET | Freq: Every day | ORAL | Status: DC
  Administered 2020-09-04 – 2020-09-08 (×5): 40 mg via ORAL
  Filled 2020-09-03 (×5): qty 1

## 2020-09-03 MED ORDER — CHLORHEXIDINE GLUCONATE CLOTH 2 % EX PADS
6.0000 | MEDICATED_PAD | Freq: Every day | CUTANEOUS | Status: DC
  Administered 2020-09-04: 6 via TOPICAL

## 2020-09-03 MED ORDER — DEXMEDETOMIDINE HCL IN NACL 400 MCG/100ML IV SOLN
0.4000 ug/kg/h | INTRAVENOUS | Status: DC
  Administered 2020-09-03: 0.5 ug/kg/h via INTRAVENOUS
  Administered 2020-09-03: 0.4 ug/kg/h via INTRAVENOUS
  Administered 2020-09-04: 0.6 ug/kg/h via INTRAVENOUS
  Filled 2020-09-03 (×3): qty 100

## 2020-09-03 MED ORDER — CALCITRIOL 0.25 MCG PO CAPS
ORAL_CAPSULE | ORAL | Status: AC
  Filled 2020-09-03: qty 1

## 2020-09-03 MED ORDER — SODIUM CHLORIDE 0.9 % IV SOLN
2.0000 g | Freq: Once | INTRAVENOUS | Status: AC
  Administered 2020-09-03: 2 g via INTRAVENOUS
  Filled 2020-09-03: qty 2

## 2020-09-03 MED ORDER — VANCOMYCIN HCL 10 G IV SOLR
2500.0000 mg | Freq: Once | INTRAVENOUS | Status: AC
  Administered 2020-09-03: 2500 mg via INTRAVENOUS
  Filled 2020-09-03: qty 2500

## 2020-09-03 MED ORDER — LABETALOL HCL 5 MG/ML IV SOLN
INTRAVENOUS | Status: AC
  Administered 2020-09-03: 10 mg via INTRAVENOUS
  Filled 2020-09-03: qty 4

## 2020-09-03 MED ORDER — ALBUTEROL SULFATE (2.5 MG/3ML) 0.083% IN NEBU
INHALATION_SOLUTION | RESPIRATORY_TRACT | Status: AC
  Administered 2020-09-03: 2.5 mg
  Filled 2020-09-03: qty 3

## 2020-09-03 MED ORDER — LABETALOL HCL 5 MG/ML IV SOLN: 10.0000 mg | INTRAVENOUS | Status: DC | PRN

## 2020-09-03 MED ORDER — SODIUM CHLORIDE 0.9 % IV SOLN
1.0000 g | INTRAVENOUS | Status: DC
  Filled 2020-09-03: qty 1

## 2020-09-03 NOTE — Significant Event (Signed)
Rapid Response Event Note   Reason for Call :  SOB, HTN Hypoxia  Initial Focused Assessment:  Upon arrival Primary RN had already given hydralazine, ativan and increased oxygen  Patient was drowsy but alert to voice and following all commands.  Appeared in no respiratory distress.  MD at bedside evaluating.  Still hypertensive 157/122 O2 95 on 6L [place]     Interventions:  Spoke with dialysis about getting patient to HD soon  Plan of Care:  Remain on unit and re-evaluated after HD   Event Summary:   MD Notified: Primary RN Call Time: [place] Time: 2536 End Time: [place]  Charlyne Quale, RN

## 2020-09-03 NOTE — Progress Notes (Signed)
Pharmacy Antibiotic Note  Scott Castillo is a 60 y.o. male admitted on 08/31/2020 with altered mental status after missing 2 dialysis sessions. Antibiotics being initiated for potential pneumonia.  Pharmacy has been consulted for Vancomycin and Cefepime dosing.  CXR showing worsening pulmonary vascular congestion.  Patient with ESRD on hemodialysis Mondays, Wednesdays, and Fridays. WBC is within normal limits. Blood and urine cultures being sent.  MRSA PCR positive. Cdiff negative.   Patient is currently in hemodialysis today - rapid response was called for hypoxia, shortness of breath and has new fever with Tmax 103. WBC is within normal limits. Blood and urine cultures being sent.  MRSA PCR positive. Cdiff negative.   Plan: Vancomycin 2500 mg IV x1 post-hemodialysis today then follow-up dialysis plans for further dosing.   Cefepime 2g IV x1 post-hemodialysis today then Cefepime 1g IV every 24 hours until determine further dialysis plans.  Follow-up clinical status/Fever curve, culture results, and for ability to narrow therapy as able.   Height: 6' (182.9 cm) Weight: 113.2 kg (249 lb 9 oz) IBW/kg (Calculated) : 77.6  Temp (24hrs), Avg:99 F (37.2 C), Min:98 F (36.7 C), Max:103 F (39.4 C)  Recent Labs  Lab 08/31/20 2112 09/01/20 1758 09/02/20 0134 09/02/20 1300 09/03/20 0938  WBC 9.3 5.9 5.7 5.7 6.8  CREATININE 17.52* 14.17*  14.27* 14.46*  --  12.66*    Estimated Creatinine Clearance: 8.3 mL/min (A) (by C-G formula based on SCr of 12.66 mg/dL (H)).    Allergies  Allergen Reactions  . Heparin Other (See Comments)    Per pt he has episodes where he can't speak  . Pioglitazone Swelling    Site of swelling not recalled by patient  . Adhesive [Tape] Other (See Comments)    Causes redness, pt prefers paper tape   . Latex Rash    Redness, also    Antimicrobials this admission: Vancomycin 5/18 >> Cefepime 5/18 >>  Dose adjustments this admission:   Microbiology  results: 5/18 BCx:  5/18 UCx:  5/16 Cdiff negative 5/16 MRSA PCR: positive 5/16 GI Panel negative  Thank you for allowing pharmacy to be a part of this patient's care.  Sloan Leiter, PharmD, BCPS, BCCCP Clinical Pharmacist Please refer to [facility] for [place] numbers 09/03/2020 4:38 PM

## 2020-09-03 NOTE — Significant Event (Addendum)
Rapid Response Event Note   Reason for Call :  Called to HD for patient becoming increasingly short of breath, tachy and agitated.   Initial Focused Assessment:  Patient oxygen on 6L was 86% RR 40-50 BP 196/122  Hot to touch with oral temp 103 Diffuse crackles, Inspiratory and expiratory wheezing and retractions noted.  CXR from 1430 had still not resulted but appeared worse than previous chest xray.  HD reported they could only pull 615cc off and was told to discontinue HD by MD [facility].  Paged MD multiple times from [facility] and after 3rd try received a call, explained situation and that CCM probably needed to get involved.  Also called RT to bedside  Patient had IV in right hand that was what appeared to be infiltrated with redness, warmth and swelling to right hand/wrist.  Patient complains of pain with light touch.    Interventions:  Ordered Bipap for now and MD ordered blood cultures, urinalysis vanc and cefipime Pulled right hand IV and placed 2 18g RFA Attempted to give tylenol but patient too agitated and uncooperative at the time so it was given upon arrival to ICU via rectal  Plan of Care:  Patient refused to keep bipap or NRB. Patient moved to ICU for higher level of care and started on precedex and will receive dialysis there.   Event Summary:   MD Notified: 1610 Call Time: [place] Time: 1600 End Time: [place]  Charlyne Quale, RN

## 2020-09-03 NOTE — Progress Notes (Signed)
Patient reporting SOB and shaking extremities. Patient's BP elevated. Notified MD. Administered hydralazine and ativan. Will continue to monitor.

## 2020-09-03 NOTE — Progress Notes (Signed)
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  He was able to answer questions in the ED and did not have any focal deficits.  Blood pressure was elevated systolics into the 852D.  Labs revealed severe nephric and amount of uremia and azotemia and a BUN/creatinine of 119/17.5.  His COVID testing was negative and chest x-ray is negative for any acute findings.  ED provider spoke lead nephrologist who recommended admission for dialysis.  Prior to him coming to the ED he had been ill and was having vomiting and diarrhea and only could tolerate 1 hour dialysis Monday.  His vomiting has now resolved and is not having abdominal pain.  Because he was significantly uremic he was admitted for dialysis and during his dialysis he became acutely agitated and could not complete more than 1/2 hours of treatment while hospitalized.  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  Nephrology continues to dialyze the patient gently daily and recommending a 2 and half hour session today and in 2 and half hour session tomorrow possibly.  Patient's mental status is back to his baseline.  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  There was subtle parenchymal volume loss greater than expected for age -EEG done 09/01/20 and showed that the study was suggestive of moderate diffuse encephalopathy which is nonspecific in etiology and no seizures or epileptiform discharges were seen throughout the recording -Presented with a BUN/Cr of 119/17.52 after missing 2.5 Dialysis Sessions -He has had similar spells in the past with dialysis when he was admitted last month -Neurology was consulted and work-up was initiated and felt his presentation was most consistent with dialysis disequilibrium syndrome and recommended no further imaging and recommended compliance with hemodialysis to largely avoid fluctuation of fluid shifts that can be caused by hemodialysis -Nephrology is following and planning slow Dialysis again today and possibly tomorrow -BUN/Cr has improved to 76/12.66 -Nephrology planning on gentle HD with a run time of 2.5 hours for the next few days to get Azotemia down  ESRD with HD MWF Metabolic Acidosis -Patient normally gets dialysis in [facility] in [place] and has missed at least 2-1/2 dialysis sessions and only completed half hour of the session on last Monday -Patient's BUN/Cr is improving from 119/17.52 -> 76/12.66 -Patient had a mild Metabolic Acidosis with a CO2 of 21, AG of 14 and a Chloride Level of 97 -Avoid Nephrotoxic Medications, Contrast Dyes, Hypotension and Renally adjust medications -Nephrology following and planning  for a 2.5 hour Dialysis Session today and possibly tomorrow until Azotemia is improved and resolved  -Continue with calcitriol 2.25 mcg p.o. every  Monday Wednesday Friday with hemodialysis and continue with ferric citrate 420 mg p.o. 3 times daily with meals -Repeat CMP int he AM   Hyperkalemia -Improved with dialysis  -Patient's potassium now is 4.7 today -Continue monitor and trend and repeat CMP in the a.m.  Hyponatremia -Mild. Patient's Na+ went from 131 -> 134 -> 133 -> 132 -Expect to correct in Dialysis -Continue to Monitor and Trend -Repeat CMP in the AM   Hypertensive Urgency -Blood pressure was elevated to systolics in the 400Q on admission -Resumed home blood pressure medications including carvedilol 25 mg p.o. twice daily and with irbesartan 150 mg p.o. daily -Continue monitor blood pressures per protocol -Last blood pressure reading was -Continue IV hydralazine 5 mg every 4 as needed for systolic blood pressure greater than 180  Diarrhea, improved  -Likely viral gastroenteritis-has not been on any recent antibiotics or has any reported fevers -His nausea vomiting and diarrhea has resolved -His GI pathogen panel and C. difficile was negative -Continue to treat supportively with antidiarrheals if necessary  Diabetes Mellitus Type 2 -Recent hemoglobin A1c was 6.4 -Continue with very sensitive NovoLog sliding scale insulin before meals and at bedtime -Continue monitor blood sugars carefully and per protocol; CBGs ranging from 99-169  Hyperlipidemia -Continue with Pravastatin 80 mg p.o. every evening  Anemia of Chronic Kidney Disease -Stable. Patient's Hgb/Hct is now 9.2/27.1 -Nephrology ordering Darbepoeting Alfa 60 mcg weekly wile hospitalized and his first dose was Wednesday, 09/03/2020 -Check Anemia Panel in the AM -Continue to Monitor for S/Sx of Bleeding; Currently no overt bleeding noted -Repeat CBC in the AM   Obesity -Complicates overall prognosis and care -Estimated body mass index is 33.31 kg/m as calculated from the following:   Height as of this encounter: 6' (1.829 m).   Weight as of this  encounter: 111.4 kg. -Weight Loss and Dietary Counseling given   DVT prophylaxis: Heparin 5,000 units sq q8h Code Status: FULL CODE  Family Communication: No family present at bedside  Disposition Plan: Pending further Nephrology Clearance and improvement in his Azotemia  Status is: Inpatient  Remains inpatient appropriate because:Unsafe d/c plan, IV treatments appropriate due to intensity of illness or inability to take PO and Inpatient level of care appropriate due to severity of illness   Dispo: The patient is from: Home              Anticipated d/c is to: Home              Patient currently is not medically stable to d/c.   Difficult to place patient No  Consultants:   Nephrology  Neurology   Procedures: Hemodialysis   EEG  Antimicrobials:  Anti-infectives (From admission, onward)   None        Subjective: Seen and examined at the bedside and denies any complaints and denies any nausea, vomiting and no more diarrhea.  Feels okay and back to his baseline and does not feel confused or anxious anymore.  Nephrology planning on dialyzing the patient again today.  No other concerns or complaints at this time  Objective: Vitals:   09/02/20 2047 09/03/20 0405 09/03/20 0736 09/03/20 1057  BP: (!) 155/84 (!) 179/95 (!) 145/61 (!) 176/97  Pulse: 67 66 82 78  Resp: 18 18 18 20   Temp: 98.4 F (36.9 C) 98.6 F (37 C) 98.4 F (36.9  C) 98.2 F (36.8 C)  TempSrc: Oral Oral Oral Oral  SpO2: 98% 92% (!) 87% 95%  Weight:      Height:        Intake/Output Summary (Last 24 hours) at 09/03/2020 1123 Last data filed at 09/03/2020 1100 Gross per 24 hour  Intake 360 ml  Output 2970 ml  Net -2610 ml   Filed Weights   09/01/20 1455 09/02/20 1250 09/02/20 1557  Weight: 113.5 kg 113.8 kg 111.4 kg   Examination: Physical Exam:  Constitutional: WN/WD obese Caucasian male currently in NAD and appears calm and comfortable Eyes: Lids and conjunctivae normal, sclerae anicteric   ENMT: External Ears, Nose appear normal. Grossly normal hearing.  Neck: Appears normal, supple, no cervical masses, normal ROM, no appreciable thyromegaly; no JVD Respiratory: Diminished to auscultation bilaterally, no wheezing, rales, rhonchi or crackles. Normal respiratory effort and patient is not tachypenic. No accessory muscle use.  Unlabored breathing Cardiovascular: RRR, no murmurs / rubs / gallops. S1 and S2 auscultated.  Is 1+ lower extremity edema Abdomen: Soft, non-tender, distended secondary body habitus.  Bowel sounds positive.  GU: Deferred. Musculoskeletal: No clubbing / cyanosis of digits/nails. No joint deformity upper and lower extremities.  Patient has a left forearm AV fistula that has a palpable thrill and auscultated bruit Skin: No rashes, lesions, ulcers on limited skin evaluation. No induration; Warm and dry.  Neurologic: CN 2-12 grossly intact with no focal deficits. Romberg sign and cerebellar reflexes not assessed.  Psychiatric: Normal judgment and insight. Alert and oriented x 3. Normal mood and appropriate affect.   Data Reviewed: I have personally reviewed following labs and imaging studies  CBC: Recent Labs  Lab 08/31/20 2112 09/01/20 1758 09/02/20 0134 09/02/20 1300 09/03/20 0938  WBC 9.3 5.9 5.7 5.7 6.8  NEUTROABS 7.3  --   --   --  5.4  HGB 10.1* 9.0* 8.6* 8.5* 9.2*  HCT 29.9* 25.8* 25.5* 25.7* 27.1*  MCV 90.3 89.0 89.8 90.5 90.9  PLT 199 176 162 188 366   Basic Metabolic Panel: Recent Labs  Lab 08/31/20 2112 09/01/20 1758 09/02/20 0134 09/03/20 0938  NA 131* 134*  133* 133* 132*  K 5.7* 4.6  4.5 4.7 4.7  CL 96* 96*  96* 98 97*  CO2 16* 22  22 21* 21*  GLUCOSE 119* 157*  157* 105* 154*  BUN 119* 89*  90* 96* 76*  CREATININE 17.52* 14.17*  14.27* 14.46* 12.66*  CALCIUM 9.0 8.5*  8.5* 8.3* 8.5*  MG  --   --   --  2.3  PHOS  --  9.1*  --  9.2*   GFR: Estimated Creatinine Clearance: 8.2 mL/min (A) (by C-G formula based on SCr of  12.66 mg/dL (H)). Liver Function Tests: Recent Labs  Lab 08/31/20 2112 09/01/20 1758 09/03/20 0938  AST 13*  --  11*  ALT 13  --  12  ALKPHOS 66  --  50  BILITOT 0.9  --  0.7  PROT 7.0  --  6.3*  ALBUMIN 3.4* 2.9* 3.0*   No results for input(s): LIPASE, AMYLASE in the last 168 hours. Recent Labs  Lab 08/31/20 2341  AMMONIA 16   Coagulation Profile: No results for input(s): INR, PROTIME in the last 168 hours. Cardiac Enzymes: No results for input(s): CKTOTAL, CKMB, CKMBINDEX, TROPONINI in the last 168 hours. BNP (last 3 results) No results for input(s): PROBNP in the last 8760 hours. HbA1C: No results for input(s): HGBA1C in the last 72 hours.  CBG: Recent Labs  Lab 09/02/20 1212 09/02/20 1647 09/02/20 2045 09/03/20 0740 09/03/20 1104  GLUCAP 141* 169* 149* 108* 159*   Lipid Profile: No results for input(s): CHOL, HDL, LDLCALC, TRIG, CHOLHDL, LDLDIRECT in the last 72 hours. Thyroid Function Tests: Recent Labs    09/01/20 0530  TSH 0.473   Anemia Panel: No results for input(s): VITAMINB12, FOLATE, FERRITIN, TIBC, IRON, RETICCTPCT in the last 72 hours. Sepsis Labs: No results for input(s): PROCALCITON, LATICACIDVEN in the last 168 hours.  Recent Results (from the past 240 hour(s))  Resp Panel by RT-PCR (Flu A&B, Covid) Nasopharyngeal Swab     Status: None   Collection Time: 08/31/20 11:43 PM   Specimen: Nasopharyngeal Swab; Nasopharyngeal(NP) swabs in vial transport medium  Result Value Ref Range Status   SARS Coronavirus 2 by RT PCR NEGATIVE NEGATIVE Final    Comment: (NOTE) SARS-CoV-2 target nucleic acids are NOT DETECTED.  The SARS-CoV-2 RNA is generally detectable in upper respiratory specimens during the acute phase of infection. The lowest concentration of SARS-CoV-2 viral copies this assay can detect is 138 copies/mL. A negative result does not preclude SARS-Cov-2 infection and should not be used as the sole basis for treatment or other patient  management decisions. A negative result may occur with  improper specimen collection/handling, submission of specimen other than nasopharyngeal swab, presence of viral mutation(s) within the areas targeted by this assay, and inadequate number of viral copies(<138 copies/mL). A negative result must be combined with clinical observations, patient history, and epidemiological information. The expected result is Negative.  Fact Sheet for Patients:  EntrepreneurPulse.com.au  Fact Sheet for Healthcare Providers:  IncredibleEmployment.be  This test is no t yet approved or cleared by the [place] FDA and  has been authorized for detection and/or diagnosis of SARS-CoV-2 by FDA under an Emergency Use Authorization (EUA). This EUA will remain  in effect (meaning this test can be used) for the duration of the COVID-19 declaration under Section 564(b)(1) of the Act, 21 U.S.C.section 360bbb-3(b)(1), unless the authorization is terminated  or revoked sooner.       Influenza A by PCR NEGATIVE NEGATIVE Final   Influenza B by PCR NEGATIVE NEGATIVE Final    Comment: (NOTE) The Xpert Xpress SARS-CoV-2/FLU/RSV plus assay is intended as an aid in the diagnosis of influenza from Nasopharyngeal swab specimens and should not be used as a sole basis for treatment. Nasal washings and aspirates are unacceptable for Xpert Xpress SARS-CoV-2/FLU/RSV testing.  Fact Sheet for Patients: EntrepreneurPulse.com.au  Fact Sheet for Healthcare Providers: IncredibleEmployment.be  This test is not yet approved or cleared by the [place] FDA and has been authorized for detection and/or diagnosis of SARS-CoV-2 by FDA under an Emergency Use Authorization (EUA). This EUA will remain in effect (meaning this test can be used) for the duration of the COVID-19 declaration under Section 564(b)(1) of the Act, 21 U.S.C. section 360bbb-3(b)(1),  unless the authorization is terminated or revoked.  Performed at [facility], [place] [address], [place], [place] [address]   Gastrointestinal Panel by PCR , Stool     Status: None   Collection Time: 09/01/20  4:05 AM   Specimen: Stool  Result Value Ref Range Status   Campylobacter species NOT DETECTED NOT DETECTED Final   Plesimonas shigelloides NOT DETECTED NOT DETECTED Final   Salmonella species NOT DETECTED NOT DETECTED Final   Yersinia enterocolitica NOT DETECTED NOT DETECTED Final   Vibrio species NOT DETECTED NOT DETECTED Final   Vibrio cholerae  NOT DETECTED NOT DETECTED Final   Enteroaggregative E coli (EAEC) NOT DETECTED NOT DETECTED Final   Enteropathogenic E coli (EPEC) NOT DETECTED NOT DETECTED Final   Enterotoxigenic E coli (ETEC) NOT DETECTED NOT DETECTED Final   Shiga like toxin producing E coli (STEC) NOT DETECTED NOT DETECTED Final   Shigella/Enteroinvasive E coli (EIEC) NOT DETECTED NOT DETECTED Final   Cryptosporidium NOT DETECTED NOT DETECTED Final   Cyclospora cayetanensis NOT DETECTED NOT DETECTED Final   Entamoeba histolytica NOT DETECTED NOT DETECTED Final   Giardia lamblia NOT DETECTED NOT DETECTED Final   Adenovirus F40/41 NOT DETECTED NOT DETECTED Final   Astrovirus NOT DETECTED NOT DETECTED Final   Norovirus GI/GII NOT DETECTED NOT DETECTED Final   Rotavirus A NOT DETECTED NOT DETECTED Final   Sapovirus (I, II, IV, and V) NOT DETECTED NOT DETECTED Final    Comment: Performed at [facility], [place] [place], [place] [address]  C Difficile Quick Screen w PCR reflex     Status: None   Collection Time: 09/01/20 12:03 PM   Specimen: STOOL  Result Value Ref Range Status   C Diff antigen NEGATIVE NEGATIVE Final   C Diff toxin NEGATIVE NEGATIVE Final   C Diff interpretation No C. difficile detected.  Final    Comment: Performed at [facility], [place] [address] [place], [place] [address]  MRSA PCR Screening     Status: Abnormal    Collection Time: 09/01/20 12:15 PM   Specimen: Nasopharyngeal  Result Value Ref Range Status   MRSA by PCR POSITIVE (A) NEGATIVE Final    Comment:        The GeneXpert MRSA Assay (FDA approved for NASAL specimens only), is one component of a comprehensive MRSA colonization surveillance program. It is not intended to diagnose MRSA infection nor to guide or monitor treatment for MRSA infections. RESULT CALLED TO, READ BACK BY AND VERIFIED WITH: A CREWS RN 1829 09/01/20 A BROWNING Performed at [facility], [place] [address] [place], [place] [address]      RN Pressure Injury Documentation:     Estimated body mass index is 33.31 kg/m as calculated from the following:   Height as of this encounter: 6' (1.829 m).   Weight as of this encounter: 111.4 kg.  Malnutrition Type:   Malnutrition Characteristics:   Nutrition Interventions:    Radiology Studies: CT HEAD WO CONTRAST  Result Date: 09/01/2020 CLINICAL DATA:  Altered mental status. EXAM: CT HEAD WITHOUT CONTRAST TECHNIQUE: Contiguous axial images were obtained from the base of the skull through the vertex without intravenous contrast. COMPARISON:  June 16, 2020 FINDINGS: Brain: There is moderate severity cerebral atrophy with widening of the extra-axial spaces and ventricular dilatation. This is stable in severity and greater than expected for the patient's age. There are areas of decreased attenuation within the white matter tracts of the supratentorial brain, consistent with microvascular disease changes. Vascular: No hyperdense vessel or unexpected calcification. Skull: Normal. Negative for fracture or focal lesion. Sinuses/Orbits: No acute finding. Other: None. IMPRESSION: 1. Generalized cerebral atrophy. 2. No acute intracranial abnormality. Electronically Signed   By: Virgina Norfolk M.D.   On: 09/01/2020 23:29   EEG adult  Result Date: 09/01/2020 Lora Havens, MD     09/01/2020  6:17 PM Patient Name: Scott Castillo MRN: [id number] Epilepsy Attending: Lora Havens Referring Physician/Provider: Anibal Henderson, NP Date: 09/01/2020 Duration: 23.38 mins Patient history: 60yo M with episode of confusion during  dialysis. EEG to evaluate for seizure Level of alertness: Awake AEDs during EEG study: Ativan Technical aspects: This EEG study was done with scalp electrodes positioned according to the 10-20 International system of electrode placement. Electrical activity was acquired at a sampling rate of 500Hz  and reviewed with a high frequency filter of 70Hz  and a low frequency filter of 1Hz . EEG data were recorded continuously and digitally stored. Description: No posterior dominant rhythm was seen. EEG showed continuous generalized 3 to 6 Hz theta-delta slowing. Hyperventilation and photic stimulation were not performed.   ABNORMALITY - Continuous slow, generalized IMPRESSION: This study is suggestive of moderate diffuse encephalopathy, nonspecific etiology. No seizures or epileptiform discharges were seen throughout the recording. Priyanka [name]   Scheduled Meds: . calcitRIOL  2.25 mcg Oral Q M,W,F-HD  . carvedilol  25 mg Oral BID WC  . Chlorhexidine Gluconate Cloth  6 each Topical Q0600  . darbepoetin (ARANESP) injection - DIALYSIS  60 mcg Intravenous Q Wed-HD  . ferric citrate  420 mg Oral TID WC  . heparin  5,000 Units Subcutaneous Q8H  . insulin aspart  0-5 Units Subcutaneous QHS  . insulin aspart  0-6 Units Subcutaneous TID WC  . irbesartan  150 mg Oral Daily  . mupirocin ointment  1 application Nasal BID  . pravastatin  80 mg Oral QPM   Continuous Infusions:   LOS: 1 day   Kerney Elbe, DO [facility] PAGER is on [place]  If 7PM-7AM, please contact night-coverage [email or web address]

## 2020-09-03 NOTE — Progress Notes (Signed)
Received a call from the floor pt is having SOB and shaking extremities. And they want to dialyze the pt immediately.  Received pt from 5w31 alert not complaining of anything, with o2 via nasal cannula at 6L. Pre assessment done and V/S taken. And HD started at 1455.  After 40 minutes of dialysis pt suddenly became agitated and breathing not ok. Dr Jonnie Finner informed and assess the pt and told me to discontinue HD and send back the pt to his room. Charge nurse was informed and called RR team and report what's going on to the pt. Rapid response team arrived and did the interventions to the pt and decided to send the pt to the ICU.

## 2020-09-03 NOTE — Progress Notes (Signed)
[facility] Progress Note  Subjective: looks good today and did not have any problems w/ gentle dialysis yesterday.   Vitals:   09/03/20 1445 09/03/20 1450 09/03/20 1500 09/03/20 1612  BP:  (!) 160/85 (!) 159/66 (!) 196/123  Pulse:  91  (!) 117  Resp:  (!) 23 (!) 23   Temp:  98.3 F (36.8 C)  (!) 103 F (39.4 C)  TempSrc:  Oral  Oral  SpO2:  99%  100%  Weight: 113.2 kg     Height:        Exam:   alert, nad   no jvd  Chest cta bilat  Cor reg no RG  Abd soft ntnd no ascites   Ext no LE edema   Alert, NF, ox3   LFA AVF+bruit    OP HD: MWF [facility]  4h 50min   400/500  111.5kg  2/2 bath P2 Hep none  - venofer 50 /wk  - mircera 75 q4, last 4/20 due 5/18  - calcitriol 2.25 ug tiw  - home meds: auryxia 2 ac tid  Assessment/ Plan: 1. AMS - presumed uremia d/t missed outpatient HD. Had severe "anxiety" on HD 5/16, suspect dialysis dysequilibrium given high BUN/ Cr and aggressive HD settings. Back to baseline now. Tolerated softer HD session yesterday, will plan the same today.  2. ESRD - HD MWF. HD daily x 2.5h until azotemia resolved.  3. HTN/vol  - BP's stable, at dry wt, min edema LE's 4. Anemia ckd - next esa due Wed, have ordered darbe 60ug weekly while here.  5. MBD ckd - CorrCa/ P are 9.3/ 9.1. Cont vdra, binder     Rob Corynne Scibilia 09/03/2020, 4:25 PM   Recent Labs  Lab 09/01/20 1758 09/02/20 0134 09/02/20 1300 09/03/20 0938  K 4.6  4.5 4.7  --  4.7  BUN 89*  90* 96*  --  76*  CREATININE 14.17*  14.27* 14.46*  --  12.66*  CALCIUM 8.5*  8.5* 8.3*  --  8.5*  PHOS 9.1*  --   --  9.2*  HGB 9.0* 8.6* 8.5* 9.2*   Inpatient medications: . calcitRIOL  2.25 mcg Oral Q M,W,F-HD  . carvedilol  25 mg Oral BID WC  . Chlorhexidine Gluconate Cloth  6 each Topical Q0600  . darbepoetin (ARANESP) injection - DIALYSIS  60 mcg Intravenous Q Wed-HD  . ferric citrate  420 mg Oral TID WC  . heparin  5,000 Units Subcutaneous Q8H  . insulin aspart  0-5 Units  Subcutaneous QHS  . insulin aspart  0-6 Units Subcutaneous TID WC  . irbesartan  150 mg Oral Daily  . mupirocin ointment  1 application Nasal BID  . pravastatin  80 mg Oral QPM    acetaminophen **OR** acetaminophen, hydrALAZINE, loperamide, LORazepam

## 2020-09-03 NOTE — Progress Notes (Signed)
A rapid response was called on this patient as he decompensated in the hemodialysis unit.  He became extremely short of breath and was febrile and became tachycardic.  Blood cultures were ordered as well as urinalysis and urine culture.  X-ray was done prior to him going to dialysis and showed look like pulmonary edema.  We will order an ABG as well as a procalcitonin level as well.  No fluid was given given that his chest x-ray looks like volume overload.  Patient was not able to be dialyzed and dialysis cut short, Given his agitation and confusion after he became febrile.  Critical care was consulted and they took the patient to the ICU to place him on a Precedex drip to get dialyzed.  He has been initiated on IV vancomycin and IV cefepime and will need to monitor his temperature curve.  Attempted to call the wife to update her about the events of today but she did not pick up.  Appreciate critical care assistance and he is going to go to bed 3M03 and be placed on a Precedex drip to be dialyzed and once He is out of the ICU and transferred back to our Service, I will resume care.

## 2020-09-03 NOTE — Consult Note (Signed)
NAME:  Scott Castillo, MRN:  [id number], DOB:  03-15-1961, LOS: 1 ADMISSION DATE:  08/31/2020, CONSULTATION DATE:  09/03/2020 REFERRING MD:  Dr. Alfredia Ferguson, CHIEF COMPLAINT:  AMS/respiratory distress  History of Present Illness:  Patient is a 60 year old male with pertinent past medical history of end-stage renal disease on HD Monday Wednesday Friday with history of non compliance, type 2 diabetes, hypertension, hyperlipidemia.  Patient admitted on 08/31/2020 with chief complaint of altered mental status after missing dialysis x2.  Admitted to [facility] for dialysis to optimize volume status and manage uremia, course complicated by likely dialysis disequilibrium. On 5/18 during HD patient became acutely agitated/confused and SOB requiring BiPAP as well as anxiolysis. Noted to also have a new fever T-max 103. HD terminated in setting of respiratory distress. PCCM consulted due to increased confusion/agitation disrupting dialysis and hypoxia requiring 100% NRB.  Labs at time of admission: Na 131, K 5.7, Glucose 119, BUN 119, Creatinine, 17.52, Albumin 3.4, AST 13, ALT 13, Alk Phos 66, Anion Gap 19, WBC 9.3  Pertinent  Medical History   Past Medical History:  Diagnosis Date  . C7 radiculopathy   . Carpal tunnel syndrome, bilateral    by EMG  R>L  . 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 H/O pancreatic cancer   . HLD (hyperlipidemia)   . HTN (hypertension)   . Lumbar radiculopathy, chronic    hemilamectomy '94  . Venous stasis dermatitis of both lower extremities      Significant Hospital Events: Including procedures, antibiotic start and stop dates in addition to other pertinent events   . 5/15: Admitted . 5/18: Patient confused and on 100% NRB pulling off mask while in dialysis. Patient would not keep BiPAP on. Transported to ICU to give Precedex. Patient hypertensive/tachycardic and started Labetolol.  Interim History / Subjective:  Patient  confused/agitated Tachycardic and hypertensive Fixing to start Precedex  Objective   Blood pressure (!) 196/123, pulse (!) 117, temperature (!) 103 F (39.4 C), temperature source Oral, resp. rate (!) 23, height 6' (1.829 m), weight 113.2 kg, SpO2 100 %.    FiO2 (%):  [100 %] 100 %   Intake/Output Summary (Last 24 hours) at 09/03/2020 1701 Last data filed at 09/03/2020 1305 Gross per 24 hour  Intake 480 ml  Output 620 ml  Net -140 ml   Filed Weights   09/02/20 1250 09/02/20 1557 09/03/20 1445  Weight: 113.8 kg 111.4 kg 113.2 kg    Examination: General: 60 yo ill appearing male, moderate distress HEENT: MM pink/moist, NRB mask in place Neuro: Confused and pulling off mask CV: s1s2, no m/r/g, Normal sinus tach, Hypertensive PULM:  Diminished/ Crackles BS throughout, RR in high 20s GI: soft, bsx4 active  Extremities: warm/dry, no edema, Left forearm fistula with good thrill, right hand swelling/erythema, BLE darker likely chronic venous insufficiency Skin: no rashes or lesions     Labs/imaging that I havepersonally reviewed  (right click and "Reselect all SmartList Selections" daily)   Labs at time of admission: Na 131, K 5.7, Glucose 119, BUN 119, Creatinine, 17.52, Albumin 3.4, AST 13, ALT 13, Alk Phos 66, Anion Gap 19, WBC 9.3  Labs 5/18: Na 132, K 4.7, Glucose 154, BUN 76, Creatinine 12.66, Phos 9.2, Albumin 3, AST 11, ALT 12, WBC 6.8, Hgb 9.2  CXR 5/18: worsening interstitial edema, hazy opacities. Likely pulmonary edema though infection difficult to exclude  Resolved Hospital Problem list     Assessment &  Plan:   ESRD Dialysis Disequilibrium HD on 5/18 got cut short due to SOB -CCM MD consulted with nephrology and plan to restart HD in ICU   Acute Respiratory Failure with hypoxia Likely pulmonary edema -plan to restart HD once on precedex - O2 for sats > 90% - ABG ordered - PRN CXR  Acute encephalopathy Metabolic encephalopathy likely due to uremia vs.  Possible infection component given tmax 103.  Patient denies ETOH/drug use but patient is a poor historian. - Precedex for agitated encephalopathy - HD as above - Workup for possible sepsis as below - Will send UDS but anticipate will be positive due hospital given  Sepsis, suspected Fever of 103  Source unknown: consider bacteremia or cellulitis MRSA PCR pos - BC X2 - Vanc and Cefepime - Close monitoring of hemodynamics - Trend fever and CBC  Mild hyponatremia Hypervolemic hyponatremia - HD as above - trend BMP   Anemia of chronic disease from CKD - Trend CBC - Iron supplementation and EPO  Diabetes T2 -SSI and basal dose insulin - CBG monitoring  HTN, with hypertensive urgency - PRN labetolol - Starting precedex for agitation, will continue telemetry  - Continue scheduled anti-hypertensives  HLD - Statin   Best practice (right click and "Reselect all SmartList Selections" daily)  Diet:  NPO Pain/Anxiety/Delirium protocol (if indicated): No VAP protocol (if indicated): Not indicated DVT prophylaxis: Subcutaneous Heparin GI prophylaxis: PPI Glucose control:  SSI Yes and Basal insulin Yes Central venous access:  N/A Arterial line:  N/A Foley:  N/A Mobility:  bed rest  PT consulted: N/A Last date of multidisciplinary goals of care discussion $RemoveBeforeD'[]'[id number]$  Code Status:  full code Disposition: ICU  Labs   CBC: Recent Labs  Lab 08/31/20 2112 09/01/20 1758 09/02/20 0134 09/02/20 1300 09/03/20 0938  WBC 9.3 5.9 5.7 5.7 6.8  NEUTROABS 7.3  --   --   --  5.4  HGB 10.1* 9.0* 8.6* 8.5* 9.2*  HCT 29.9* 25.8* 25.5* 25.7* 27.1*  MCV 90.3 89.0 89.8 90.5 90.9  PLT 199 176 162 188 388    Basic Metabolic Panel: Recent Labs  Lab 08/31/20 2112 09/01/20 1758 09/02/20 0134 09/03/20 0938  NA 131* 134*  133* 133* 132*  K 5.7* 4.6  4.5 4.7 4.7  CL 96* 96*  96* 98 97*  CO2 16* 22  22 21* 21*  GLUCOSE 119* 157*  157* 105* 154*  BUN 119* 89*  90* 96* 76*  CREATININE  17.52* 14.17*  14.27* 14.46* 12.66*  CALCIUM 9.0 8.5*  8.5* 8.3* 8.5*  MG  --   --   --  2.3  PHOS  --  9.1*  --  9.2*   GFR: Estimated Creatinine Clearance: 8.3 mL/min (A) (by C-G formula based on SCr of 12.66 mg/dL (H)). Recent Labs  Lab 09/01/20 1758 09/02/20 0134 09/02/20 1300 09/03/20 0938  WBC 5.9 5.7 5.7 6.8    Liver Function Tests: Recent Labs  Lab 08/31/20 2112 09/01/20 1758 09/03/20 0938  AST 13*  --  11*  ALT 13  --  12  ALKPHOS 66  --  50  BILITOT 0.9  --  0.7  PROT 7.0  --  6.3*  ALBUMIN 3.4* 2.9* 3.0*   No results for input(s): LIPASE, AMYLASE in the last 168 hours. Recent Labs  Lab 08/31/20 2341  AMMONIA 16    ABG    Component Value Date/Time   PHART 7.464 (H) 09/01/2020 1650   PCO2ART 30.0 (L) 09/01/2020 1650  PO2ART 67.1 (L) 09/01/2020 1650   HCO3 21.3 09/01/2020 1650   TCO2 24 06/16/2020 1420   ACIDBASEDEF 2.0 09/01/2020 1650   O2SAT 93.0 09/01/2020 1650     Coagulation Profile: No results for input(s): INR, PROTIME in the last 168 hours.  Cardiac Enzymes: No results for input(s): CKTOTAL, CKMB, CKMBINDEX, TROPONINI in the last 168 hours.  HbA1C: Hgb A1c MFr Bld  Date/Time Value Ref Range Status  08/19/2020 04:18 AM 6.4 (H) 4.8 - 5.6 % Final    Comment:    (NOTE) Pre diabetes:          5.7%-6.4%  Diabetes:              >6.4%  Glycemic control for   <7.0% adults with diabetes   06/17/2020 02:46 AM 8.0 (H) 4.8 - 5.6 % Final    Comment:    (NOTE) Pre diabetes:          5.7%-6.4%  Diabetes:              >6.4%  Glycemic control for   <7.0% adults with diabetes     CBG: Recent Labs  Lab 09/02/20 1647 09/02/20 2045 09/03/20 0740 09/03/20 1104 09/03/20 1346  GLUCAP 169* 149* 108* 159* 156*    Review of Systems:   Patient poor historian. Mostly reviewed chart and report from nurse.  Past Medical History:  He,  has a past medical history of C7 radiculopathy, Carpal tunnel syndrome, bilateral, DM (diabetes  mellitus), type 2 with neurological complications (HCC), H/O pancreatic cancer, HLD (hyperlipidemia), HTN (hypertension), Lumbar radiculopathy, chronic, and Venous stasis dermatitis of both lower extremities.   Surgical History:   Past Surgical History:  Procedure Laterality Date  . A/V FISTULAGRAM Left 09/27/2019   Procedure: A/V FISTULAGRAM;  Surgeon: Cephus Shelling, MD;  Location: [facility] INVASIVE CV LAB;  Service: Cardiovascular;  Laterality: Left;  . AV FISTULA PLACEMENT Left 09/17/2016   Procedure: ARTERIOVENOUS (AV) FISTULA CREATION;  Surgeon: Larina Earthly, MD;  Location: [facility] OR;  Service: Vascular;  Laterality: Left;  . CATARACT EXTRACTION W/ INTRAOCULAR LENS IMPLANT Left 2015  . ENDOVENOUS ABLATION SAPHENOUS VEIN W/ LASER Right 04/08/2016   EVLA R greater saphenous vein by Gretta Began MD  . ENDOVENOUS ABLATION SAPHENOUS VEIN W/ LASER Left 05/13/2016   endovenous laser ablation (left greater saphenous vein) by Gretta Began MD   . FOOT SURGERY    . HEMILAMINOTOMY LUMBAR SPINE  '94  . TONSILLECTOMY       Social History:   reports that he has never smoked. He has never used smokeless tobacco. He reports that he does not drink alcohol and does not use drugs.   Family History:  His family history includes Edema in his mother; Heart attack in his brother, brother, and father; Heart failure in his father.   Allergies Allergies  Allergen Reactions  . Heparin Other (See Comments)    Per pt he has episodes where he can't speak  . Pioglitazone Swelling    Site of swelling not recalled by patient  . Adhesive [Tape] Other (See Comments)    Causes redness, pt prefers paper tape   . Latex Rash    Redness, also     Home Medications  Prior to Admission medications   Medication Sig Start Date End Date Taking? Authorizing Provider  acetaminophen (TYLENOL) 325 MG tablet Take 2 tablets (650 mg total) by mouth every 6 (six) hours as needed for mild pain (or Fever >/= 101). 08/19/20  Yes Domenic Polite, MD  ascorbic acid (VITAMIN C) 1000 MG tablet Take 1,000 mg by mouth daily.   Yes [provider]  carvedilol (COREG) 25 MG tablet Take 25 mg by mouth 2 (two) times daily with a meal.  07/28/15  Yes [provider]  cyanocobalamin 1000 MCG tablet Take 1,000 mcg by mouth daily.   Yes [provider]  ferric citrate (AURYXIA) 1 GM 210 MG(Fe) tablet Take 210-420 mg by mouth See admin instructions. Take 2 tablets (420 mg) by mouth 3 times daily with meals and 1 tablet (210 mg) with snacks   Yes [provider]  Insulin Pen Needle (BD ULTRA-FINE PEN NEEDLES) 29G X 12.7MM MISC 1 each by Other route as directed. 09/22/19  Yes [provider]  irbesartan (AVAPRO) 150 MG tablet Take 150 mg by mouth daily. 09/05/19  Yes [provider]  pravastatin (PRAVACHOL) 80 MG tablet Take 80 mg by mouth every evening.    Yes [provider]  Semaglutide (RYBELSUS) 14 MG TABS Take 14 mg by mouth daily. 10/05/18  Yes [provider]  torsemide (DEMADEX) 100 MG tablet Take 100 mg by mouth See admin instructions. Takes on Tuesday,Thursday,Saturday and Sunday(non-dialysis days) 02/20/20  Yes [provider]     Critical care time: [place], PA-C [facility] 09/03/2020, 5:01 PM  Please see [email or web address] for pager details.  From 7A-7P if no response, please call [phone]. After hours, please call ELink [phone].

## 2020-09-04 ENCOUNTER — Inpatient Hospital Stay (HOSPITAL_COMMUNITY): Payer: Medicare Other

## 2020-09-04 DIAGNOSIS — R7881 Bacteremia: Secondary | ICD-10-CM

## 2020-09-04 DIAGNOSIS — E1169 Type 2 diabetes mellitus with other specified complication: Secondary | ICD-10-CM | POA: Diagnosis not present

## 2020-09-04 DIAGNOSIS — B9562 Methicillin resistant Staphylococcus aureus infection as the cause of diseases classified elsewhere: Secondary | ICD-10-CM | POA: Diagnosis not present

## 2020-09-04 DIAGNOSIS — J9601 Acute respiratory failure with hypoxia: Secondary | ICD-10-CM

## 2020-09-04 DIAGNOSIS — I160 Hypertensive urgency: Secondary | ICD-10-CM | POA: Diagnosis not present

## 2020-09-04 DIAGNOSIS — G9340 Encephalopathy, unspecified: Secondary | ICD-10-CM | POA: Diagnosis not present

## 2020-09-04 DIAGNOSIS — R6520 Severe sepsis without septic shock: Secondary | ICD-10-CM

## 2020-09-04 DIAGNOSIS — E875 Hyperkalemia: Secondary | ICD-10-CM | POA: Diagnosis not present

## 2020-09-04 DIAGNOSIS — A419 Sepsis, unspecified organism: Secondary | ICD-10-CM

## 2020-09-04 DIAGNOSIS — N186 End stage renal disease: Secondary | ICD-10-CM | POA: Diagnosis not present

## 2020-09-04 LAB — BLOOD CULTURE ID PANEL (REFLEXED) - BCID2

## 2020-09-04 LAB — BASIC METABOLIC PANEL
Anion gap: 13 (ref 5–15)
BUN: 42 mg/dL — ABNORMAL HIGH (ref 6–20)
CO2: 24 mmol/L (ref 22–32)
Calcium: 8.7 mg/dL — ABNORMAL LOW (ref 8.9–10.3)
Chloride: 95 mmol/L — ABNORMAL LOW (ref 98–111)
Creatinine, Ser: 8.27 mg/dL — ABNORMAL HIGH (ref 0.61–1.24)
GFR, Estimated: 7 mL/min — ABNORMAL LOW (ref >60)
Glucose, Bld: 156 mg/dL — ABNORMAL HIGH (ref 70–99)
Potassium: 4.2 mmol/L (ref 3.5–5.1)
Sodium: 132 mmol/L — ABNORMAL LOW (ref 135–145)

## 2020-09-04 LAB — RAPID URINE DRUG SCREEN, HOSP PERFORMED
Amphetamines: NOT DETECTED
Barbiturates: NOT DETECTED
Benzodiazepines: NOT DETECTED
Cocaine: NOT DETECTED
Opiates: NOT DETECTED
Tetrahydrocannabinol: NOT DETECTED

## 2020-09-04 LAB — COMPREHENSIVE METABOLIC PANEL
ALT: 11 U/L (ref 0–44)
AST: 9 U/L — ABNORMAL LOW (ref 15–41)
Albumin: 2.8 g/dL — ABNORMAL LOW (ref 3.5–5.0)
Alkaline Phosphatase: 47 U/L (ref 38–126)
Anion gap: 14 (ref 5–15)
BUN: 75 mg/dL — ABNORMAL HIGH (ref 6–20)
CO2: 21 mmol/L — ABNORMAL LOW (ref 22–32)
Calcium: 8.5 mg/dL — ABNORMAL LOW (ref 8.9–10.3)
Chloride: 98 mmol/L (ref 98–111)
Creatinine, Ser: 12.99 mg/dL — ABNORMAL HIGH (ref 0.61–1.24)
GFR, Estimated: 4 mL/min — ABNORMAL LOW (ref >60)
Glucose, Bld: 189 mg/dL — ABNORMAL HIGH (ref 70–99)
Potassium: 5.2 mmol/L — ABNORMAL HIGH (ref 3.5–5.1)
Sodium: 133 mmol/L — ABNORMAL LOW (ref 135–145)
Total Bilirubin: 1 mg/dL (ref 0.3–1.2)
Total Protein: 6.3 g/dL — ABNORMAL LOW (ref 6.5–8.1)

## 2020-09-04 LAB — URINALYSIS, ROUTINE W REFLEX MICROSCOPIC
Bilirubin Urine: NEGATIVE
Glucose, UA: >=500 mg/dL — AB
Hgb urine dipstick: NEGATIVE
Ketones, ur: NEGATIVE mg/dL
Leukocytes,Ua: NEGATIVE
Nitrite: NEGATIVE
Protein, ur: >=300 mg/dL — AB
Specific Gravity, Urine: 1.013 (ref 1.005–1.030)
pH: 7 (ref 5.0–8.0)

## 2020-09-04 LAB — IRON AND TIBC
Iron: 27 ug/dL — ABNORMAL LOW (ref 45–182)
Saturation Ratios: 13 % — ABNORMAL LOW (ref 17.9–39.5)
TIBC: 213 ug/dL — ABNORMAL LOW (ref 250–450)
UIBC: 186 ug/dL

## 2020-09-04 LAB — RETICULOCYTES
Immature Retic Fract: 4.3 % (ref 2.3–15.9)
RBC.: 3.04 MIL/uL — ABNORMAL LOW (ref 4.22–5.81)
Retic Count, Absolute: 26.4 10*3/uL (ref 19.0–186.0)
Retic Ct Pct: 0.9 % (ref 0.4–3.1)

## 2020-09-04 LAB — CBC WITH DIFFERENTIAL/PLATELET
Abs Immature Granulocytes: 0.11 10*3/uL — ABNORMAL HIGH (ref 0.00–0.07)
Basophils Absolute: 0 10*3/uL (ref 0.0–0.1)
Basophils Relative: 0 %
Eosinophils Absolute: 0 10*3/uL (ref 0.0–0.5)
Eosinophils Relative: 0 %
HCT: 27.3 % — ABNORMAL LOW (ref 39.0–52.0)
Hemoglobin: 9.2 g/dL — ABNORMAL LOW (ref 13.0–17.0)
Immature Granulocytes: 1 %
Lymphocytes Relative: 1 %
Lymphs Abs: 0.1 10*3/uL — ABNORMAL LOW (ref 0.7–4.0)
MCH: 30.7 pg (ref 26.0–34.0)
MCHC: 33.7 g/dL (ref 30.0–36.0)
MCV: 91 fL (ref 80.0–100.0)
Monocytes Absolute: 0.6 10*3/uL (ref 0.1–1.0)
Monocytes Relative: 4 %
Neutro Abs: 16 10*3/uL — ABNORMAL HIGH (ref 1.7–7.7)
Neutrophils Relative %: 94 %
Platelets: 166 10*3/uL (ref 150–400)
RBC: 3 MIL/uL — ABNORMAL LOW (ref 4.22–5.81)
RDW: 12.8 % (ref 11.5–15.5)
WBC: 16.9 10*3/uL — ABNORMAL HIGH (ref 4.0–10.5)
nRBC: 0 % (ref 0.0–0.2)

## 2020-09-04 LAB — MAGNESIUM: Magnesium: 2 mg/dL (ref 1.7–2.4)

## 2020-09-04 LAB — FOLATE: Folate: 20.3 ng/mL (ref >5.9)

## 2020-09-04 LAB — VITAMIN B12: Vitamin B-12: 357 pg/mL (ref 180–914)

## 2020-09-04 LAB — PHOSPHORUS: Phosphorus: 8.3 mg/dL — ABNORMAL HIGH (ref 2.5–4.6)

## 2020-09-04 LAB — GLUCOSE, CAPILLARY
Glucose-Capillary: 149 mg/dL — ABNORMAL HIGH (ref 70–99)
Glucose-Capillary: 183 mg/dL — ABNORMAL HIGH (ref 70–99)
Glucose-Capillary: 181 mg/dL — ABNORMAL HIGH (ref 70–99)
Glucose-Capillary: 194 mg/dL — ABNORMAL HIGH (ref 70–99)

## 2020-09-04 LAB — FERRITIN: Ferritin: 714 ng/mL — ABNORMAL HIGH (ref 24–336)

## 2020-09-04 LAB — PROCALCITONIN: Procalcitonin: 64.33 ng/mL

## 2020-09-04 MED ORDER — VANCOMYCIN HCL 1000 MG/200ML IV SOLN
1000.0000 mg | Freq: Once | INTRAVENOUS | Status: AC
  Administered 2020-09-04: 1000 mg via INTRAVENOUS
  Filled 2020-09-04: qty 200

## 2020-09-04 MED ORDER — CHLORHEXIDINE GLUCONATE CLOTH 2 % EX PADS
6.0000 | MEDICATED_PAD | Freq: Every day | CUTANEOUS | Status: DC
  Administered 2020-09-04 – 2020-09-07 (×3): 6 via TOPICAL

## 2020-09-04 NOTE — Progress Notes (Signed)
Sp02 = 87% on room air,not in distress, diminished breath sound on lower lung fields. Encouraged to do DBE. SP02 remained at 88%. Hooked to supplemental 02 at 2 lpm as ordered. Sp02 = 96%, on continous oxygen monitoring.

## 2020-09-04 NOTE — Progress Notes (Signed)
PHARMACY - PHYSICIAN COMMUNICATION CRITICAL VALUE ALERT - BLOOD CULTURE IDENTIFICATION (BCID)  Scott Castillo is an 60 y.o. male who presented to [facility] on 08/31/2020 with a chief complaint of AMS after missing 2 HD sessions, diarrhea and vomiting 1 wk PTA.   Assessment:  2/2 Bcx (both aerobic) GPCs in clusters, BCID MRSA  Name of physician (or Provider) Contacted: ID and CCM alerted   Current antibiotics: cefepime and vancomycin  Changes to prescribed antibiotics recommended:  ID auto-consulted and will adjust antibiotics  No results found for this or any previous visit.  Scott Castillo, PharmD PGY1 Pharmacy Resident 09/04/2020 1:26 PM  Please check [email or web address] for unit-specific pharmacy phone numbers.

## 2020-09-04 NOTE — Consult Note (Signed)
[facility]    Date of Admission:  08/31/2020     Total days of antibiotics 2   Vancomycin   Cefepime                Reason for Consult: MRSA Bacteremia     Referring Provider: CHAMP Primary Care Provider: Algis Greenhouse, MD    Assessment: Scott Castillo is a 60 y.o. male with 1 week history of vomiting and diarrhea (since resolved) and altered mental status after missing a few HD sessions. At presentation to the hospital he was afebrile, met no SIRS criteria and no leukocytosis. Hospital day 4 he developed sudden onset high fever to 103 and leukocytosis with blood cultures revealing MRSA in both aerobic bottles drawn from the right hand.   He does have some linear/streaking erythema and tenderness/warmth overlying a previous IV site in the right hand that I wonder is the source of his bacteremia. His lower extremity wounds do not appear to be infected. AVF appears benign. Will repeat blood cultures again tomorrow AM and continue vancomycin alone for now.  Will check TTE to start working up consideration for endocarditis. (no murmur on exam and native heart valves). No findings of metastatic infection elsewhere.    Plan: 1. Continue vancomycin alone 2. Repeat blood cultures in AM  3. TTE 4. ?r/t phlebitis from IV site 5. MRSA contact precautions     Principal Problem:   Encephalopathy Active Problems:   Diabetes mellitus type 2 in obese (HCC)   Hypertensive urgency   ESRD (end stage renal disease) ([place])   Acute metabolic encephalopathy   Hyperkalemia, diminished renal excretion   Acute respiratory failure with hypoxia (HCC)   MRSA bacteremia   . calcitRIOL  2.25 mcg Oral Q M,W,F-HD  . carvedilol  25 mg Oral BID WC  . Chlorhexidine Gluconate Cloth  6 each Topical Q0600  . darbepoetin (ARANESP) injection - DIALYSIS  60 mcg Intravenous Q Wed-HD  . ferric citrate  420 mg Oral TID WC  . heparin  5,000 Units Subcutaneous Q8H  .  insulin aspart  0-5 Units Subcutaneous QHS  . insulin aspart  0-6 Units Subcutaneous TID WC  . irbesartan  150 mg Oral Daily  . mupirocin ointment  1 application Nasal BID  . pantoprazole  40 mg Oral Q1200  . pravastatin  80 mg Oral QPM  . vancomycin variable dose per unstable renal function (pharmacist dosing)   Does not apply See admin instructions    HPI: Scott Castillo is a 60 y.o. male admitted with AMS after missed HD sessions after he "had something wrong with his brain after HD". Also with diarrhea and vomiting x 1 week.   He states that his diarrhea and vomiting have resolved. He does not report any fevers or chills prior to coming to the hospital. He denies any new musculoskeletal or soft tissue pains. Has some chronic ulcerations with scabs on bilateral shins L>R from climbing on a ladder. None are painful or draining.  He has a LUA AVF that has been functioning normally - states he has had a few balloon interventions to open up in the past but nothing recently.   Hospital day 4 he developed sudden new onset high fever to 103 with leukocytosis. Blood cultures were drawn and reveal MRSA growing in both anaerobic bottles (only 2 bottles drawn and only drawn from R hand). Mr. Surita tells me he previously  had a peripheral IV that was removed in the hand due to pain and redness.     Review of Systems: Review of Systems  Constitutional: Positive for chills and fever.  HENT: Negative for tinnitus.   Eyes: Negative for blurred vision and photophobia.  Respiratory: Negative for cough and sputum production.   Cardiovascular: Negative for chest pain.  Gastrointestinal: Negative for abdominal pain, diarrhea (resolved), nausea and vomiting (resolved).  Musculoskeletal: Negative for back pain, joint pain and myalgias.  Skin: Negative for rash.  Neurological: Negative for weakness and headaches.    Past Medical History:  Diagnosis Date  . C7 radiculopathy   . Carpal tunnel syndrome,  bilateral    by EMG  R>L  . 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 H/O pancreatic cancer   . HLD (hyperlipidemia)   . HTN (hypertension)   . Lumbar radiculopathy, chronic    hemilamectomy '94  . Venous stasis dermatitis of both lower extremities     Social History   Tobacco Use  . Smoking status: Never Smoker  . Smokeless tobacco: Never Used  Substance Use Topics  . Alcohol use: No  . Drug use: No    Family History  Problem Relation Age of Onset  . Edema Mother   . Heart attack Father   . Heart failure Father   . Heart attack Brother   . Heart attack Brother    Allergies  Allergen Reactions  . Heparin Other (See Comments)    Per pt he has episodes where he can't speak  . Pioglitazone Swelling    Site of swelling not recalled by patient  . Adhesive [Tape] Other (See Comments)    Causes redness, pt prefers paper tape   . Latex Rash    Redness, also    OBJECTIVE: Blood pressure 123/67, pulse 87, temperature 98.5 F (36.9 C), resp. rate 20, height 6' (1.829 m), weight 110.7 kg, SpO2 96 %.  Physical Exam Vitals reviewed.  Constitutional:      Appearance: Normal appearance. He is not ill-appearing.  HENT:     Mouth/Throat:     Mouth: Mucous membranes are moist.     Pharynx: Oropharynx is clear.  Eyes:     General: No scleral icterus.    Pupils: Pupils are equal, round, and reactive to light.  Cardiovascular:     Rate and Rhythm: Normal rate and regular rhythm.     Heart sounds: No murmur heard.     Comments: LUA AVF unremarkable visually  Pulmonary:     Effort: Pulmonary effort is normal.     Breath sounds: Wheezing (LUL) present.  Abdominal:     General: Bowel sounds are normal. There is no distension.     Palpations: Abdomen is soft.  Musculoskeletal:        General: No swelling or tenderness.  Skin:    General: Skin is warm and dry.     Capillary Refill: Capillary refill takes less than 2 seconds.      Findings: Rash present.     Comments: Multiple papular/pustular appearing lesions to the R arm.  R hand with streaking erythema and tender nodule where previous PIV was.   B/L LE's with chronic discoloration from venous stasis and dry black scabs from previous skin injuries.   Neurological:     Mental Status: He is alert and oriented to person, place, and time.     Lab Results Lab Results  Component Value  Date   WBC 16.9 (H) 09/04/2020   HGB 9.2 (L) 09/04/2020   HCT 27.3 (L) 09/04/2020   MCV 91.0 09/04/2020   PLT 166 09/04/2020    Lab Results  Component Value Date   CREATININE 8.27 (H) 09/04/2020   BUN 42 (H) 09/04/2020   NA 132 (L) 09/04/2020   K 4.2 09/04/2020   CL 95 (L) 09/04/2020   CO2 24 09/04/2020    Lab Results  Component Value Date   ALT 11 09/04/2020   AST 9 (L) 09/04/2020   ALKPHOS 47 09/04/2020   BILITOT 1.0 09/04/2020     Microbiology: Recent Results (from the past 240 hour(s))  Resp Panel by RT-PCR (Flu A&B, Covid) Nasopharyngeal Swab     Status: None   Collection Time: 08/31/20 11:43 PM   Specimen: Nasopharyngeal Swab; Nasopharyngeal(NP) swabs in vial transport medium  Result Value Ref Range Status   SARS Coronavirus 2 by RT PCR NEGATIVE NEGATIVE Final    Comment: (NOTE) SARS-CoV-2 target nucleic acids are NOT DETECTED.  The SARS-CoV-2 RNA is generally detectable in upper respiratory specimens during the acute phase of infection. The lowest concentration of SARS-CoV-2 viral copies this assay can detect is 138 copies/mL. A negative result does not preclude SARS-Cov-2 infection and should not be used as the sole basis for treatment or other patient management decisions. A negative result may occur with  improper specimen collection/handling, submission of specimen other than nasopharyngeal swab, presence of viral mutation(s) within the areas targeted by this assay, and inadequate number of viral copies(<138 copies/mL). A negative result must be  combined with clinical observations, patient history, and epidemiological information. The expected result is Negative.  Fact Sheet for Patients:  BloggerCourse.com  Fact Sheet for Healthcare Providers:  SeriousBroker.it  This test is no t yet approved or cleared by the [place] FDA and  has been authorized for detection and/or diagnosis of SARS-CoV-2 by FDA under an Emergency Use Authorization (EUA). This EUA will remain  in effect (meaning this test can be used) for the duration of the COVID-19 declaration under Section 564(b)(1) of the Act, 21 U.S.C.section 360bbb-3(b)(1), unless the authorization is terminated  or revoked sooner.       Influenza A by PCR NEGATIVE NEGATIVE Final   Influenza B by PCR NEGATIVE NEGATIVE Final    Comment: (NOTE) The Xpert Xpress SARS-CoV-2/FLU/RSV plus assay is intended as an aid in the diagnosis of influenza from Nasopharyngeal swab specimens and should not be used as a sole basis for treatment. Nasal washings and aspirates are unacceptable for Xpert Xpress SARS-CoV-2/FLU/RSV testing.  Fact Sheet for Patients: BloggerCourse.com  Fact Sheet for Healthcare Providers: SeriousBroker.it  This test is not yet approved or cleared by the [place] FDA and has been authorized for detection and/or diagnosis of SARS-CoV-2 by FDA under an Emergency Use Authorization (EUA). This EUA will remain in effect (meaning this test can be used) for the duration of the COVID-19 declaration under Section 564(b)(1) of the Act, 21 U.S.C. section 360bbb-3(b)(1), unless the authorization is terminated or revoked.  Performed at [facility], [address], [place], [place] [address]   Gastrointestinal Panel by PCR , Stool     Status: None   Collection Time: 09/01/20  4:05 AM   Specimen: Stool  Result Value Ref Range Status   Campylobacter species NOT  DETECTED NOT DETECTED Final   Plesimonas shigelloides NOT DETECTED NOT DETECTED Final   Salmonella species NOT DETECTED NOT DETECTED Final  Yersinia enterocolitica NOT DETECTED NOT DETECTED Final   Vibrio species NOT DETECTED NOT DETECTED Final   Vibrio cholerae NOT DETECTED NOT DETECTED Final   Enteroaggregative E coli (EAEC) NOT DETECTED NOT DETECTED Final   Enteropathogenic E coli (EPEC) NOT DETECTED NOT DETECTED Final   Enterotoxigenic E coli (ETEC) NOT DETECTED NOT DETECTED Final   Shiga like toxin producing E coli (STEC) NOT DETECTED NOT DETECTED Final   Shigella/Enteroinvasive E coli (EIEC) NOT DETECTED NOT DETECTED Final   Cryptosporidium NOT DETECTED NOT DETECTED Final   Cyclospora cayetanensis NOT DETECTED NOT DETECTED Final   Entamoeba histolytica NOT DETECTED NOT DETECTED Final   Giardia lamblia NOT DETECTED NOT DETECTED Final   Adenovirus F40/41 NOT DETECTED NOT DETECTED Final   Astrovirus NOT DETECTED NOT DETECTED Final   Norovirus GI/GII NOT DETECTED NOT DETECTED Final   Rotavirus A NOT DETECTED NOT DETECTED Final   Sapovirus (I, II, IV, and V) NOT DETECTED NOT DETECTED Final    Comment: Performed at [facility], [place] [place], [place] [address]  C Difficile Quick Screen w PCR reflex     Status: None   Collection Time: 09/01/20 12:03 PM   Specimen: STOOL  Result Value Ref Range Status   C Diff antigen NEGATIVE NEGATIVE Final   C Diff toxin NEGATIVE NEGATIVE Final   C Diff interpretation No C. difficile detected.  Final    Comment: Performed at [facility], [place] [address], [place], [place] [address]  MRSA PCR Screening     Status: Abnormal   Collection Time: 09/01/20 12:15 PM   Specimen: Nasopharyngeal  Result Value Ref Range Status   MRSA by PCR POSITIVE (A) NEGATIVE Final    Comment:        The GeneXpert MRSA Assay (FDA approved for NASAL specimens only), is one component of a comprehensive MRSA colonization surveillance program. It  is not intended to diagnose MRSA infection nor to guide or monitor treatment for MRSA infections. RESULT CALLED TO, READ BACK BY AND VERIFIED WITH: A CREWS RN 1219 09/01/20 A BROWNING Performed at [facility], [place] [address] [place], [place] [address]   Culture, blood (routine x 2)     Status: None (Preliminary result)   Collection Time: 09/03/20  6:13 PM   Specimen: BLOOD RIGHT HAND  Result Value Ref Range Status   Specimen Description BLOOD RIGHT HAND  Final   Special Requests   Final    BOTTLES DRAWN AEROBIC ONLY Blood Culture adequate volume   Culture  Setup Time   Final    GRAM POSITIVE COCCI IN CLUSTERS AEROBIC BOTTLE ONLY CRITICAL RESULT CALLED TO, READ BACK BY AND VERIFIED WITH: PHARMD MADISON YATES AT 2549 ON 09/04/20 BY KJ Performed at [facility], [place] [address] [place],  [address]    Culture GRAM POSITIVE COCCI  Final   Report Status PENDING  Incomplete  Blood Culture ID Panel (Reflexed)     Status: Abnormal   Collection Time: 09/03/20  6:13 PM  Result Value Ref Range Status   Enterococcus faecalis NOT DETECTED NOT DETECTED Final   Enterococcus Faecium NOT DETECTED NOT DETECTED Final   Listeria monocytogenes NOT DETECTED NOT DETECTED Final   Staphylococcus species DETECTED (A) NOT DETECTED Final    Comment: CRITICAL RESULT CALLED TO, READ BACK BY AND VERIFIED WITH: PHARMD MADSION YATES AT 1325 ON 09/04/20 BY KJ    Staphylococcus aureus (BCID) DETECTED (A) NOT DETECTED Final  Comment: Methicillin (oxacillin)-resistant Staphylococcus aureus (MRSA). MRSA is predictably resistant to beta-lactam antibiotics (except ceftaroline). Preferred therapy is vancomycin unless clinically contraindicated. Patient requires contact precautions if  hospitalized. CRITICAL RESULT CALLED TO, READ BACK BY AND VERIFIED WITH: PHARMD MADISON YATES AT 3241 ON 09/04/20 BY KJ    Staphylococcus epidermidis NOT DETECTED NOT DETECTED Final   Staphylococcus lugdunensis NOT  DETECTED NOT DETECTED Final   Streptococcus species NOT DETECTED NOT DETECTED Final   Streptococcus agalactiae NOT DETECTED NOT DETECTED Final   Streptococcus pneumoniae NOT DETECTED NOT DETECTED Final   Streptococcus pyogenes NOT DETECTED NOT DETECTED Final   A.calcoaceticus-baumannii NOT DETECTED NOT DETECTED Final   Bacteroides fragilis NOT DETECTED NOT DETECTED Final   Enterobacterales NOT DETECTED NOT DETECTED Final   Enterobacter cloacae complex NOT DETECTED NOT DETECTED Final   Escherichia coli NOT DETECTED NOT DETECTED Final   Klebsiella aerogenes NOT DETECTED NOT DETECTED Final   Klebsiella oxytoca NOT DETECTED NOT DETECTED Final   Klebsiella pneumoniae NOT DETECTED NOT DETECTED Final   Proteus species NOT DETECTED NOT DETECTED Final   Salmonella species NOT DETECTED NOT DETECTED Final   Serratia marcescens NOT DETECTED NOT DETECTED Final   Haemophilus influenzae NOT DETECTED NOT DETECTED Final   Neisseria meningitidis NOT DETECTED NOT DETECTED Final   Pseudomonas aeruginosa NOT DETECTED NOT DETECTED Final   Stenotrophomonas maltophilia NOT DETECTED NOT DETECTED Final   Candida albicans NOT DETECTED NOT DETECTED Final   Candida auris NOT DETECTED NOT DETECTED Final   Candida glabrata NOT DETECTED NOT DETECTED Final   Candida krusei NOT DETECTED NOT DETECTED Final   Candida parapsilosis NOT DETECTED NOT DETECTED Final   Candida tropicalis NOT DETECTED NOT DETECTED Final   Cryptococcus neoformans/gattii NOT DETECTED NOT DETECTED Final   Meth resistant mecA/C and MREJ DETECTED (A) NOT DETECTED Final    Comment: CRITICAL RESULT CALLED TO, READ BACK BY AND VERIFIED WITH: PHARMD MADISON YATES AT 1325 BY KJ ON 09/03/20 Performed at [facility], [address] [place], [place] [address]   Culture, blood (routine x 2)     Status: None (Preliminary result)   Collection Time: 09/03/20  6:15 PM   Specimen: BLOOD RIGHT HAND  Result Value Ref Range Status   Specimen Description  BLOOD RIGHT HAND  Final   Special Requests   Final    BOTTLES DRAWN AEROBIC ONLY Blood Culture adequate volume   Culture  Setup Time   Final    GRAM POSITIVE COCCI AEROBIC BOTTLE ONLY Performed at [facility], [place] [address] [place],  [address]    Culture GRAM POSITIVE COCCI  Final   Report Status PENDING  Incomplete    Janene Madeira, MSN, NP-C [facility] [email or web address] Pager: [phone] Office: [phone] [place]: [phone]

## 2020-09-04 NOTE — Progress Notes (Signed)
Pharmacy Antibiotic Note  Scott Castillo is a 60 y.o. male admitted on 08/31/2020 with altered mental status after missing 2 dialysis sessions. Antibiotics being initiated for sepsis and potential pneumonia.  Pharmacy has been consulted for vancomycin and cefepime dosing.  Patient was started on HD on 5/18, then rapid response was called for hypoxia, SOB and a new fever. Vancomycin load was given, then patient finished his dialysis session early 5/19 AM.   Tmax 101.9, WBC 16.9, PCT up 64  Plan: Vanc 1000mg  IV now Cefepime 1gm IV Q24H Monitor HD schedule, clinical progress, vanc level as indicated  Height: 6' (182.9 cm) Weight: 110.7 kg (244 lb 0.8 oz) IBW/kg (Calculated) : 77.6  Temp (24hrs), Avg:99.9 F (37.7 C), Min:98 F (36.7 C), Max:103 F (39.4 C)  Recent Labs  Lab 09/01/20 1758 09/02/20 0134 09/02/20 1300 09/03/20 0938 09/04/20 0153 09/04/20 0827  WBC 5.9 5.7 5.7 6.8 16.9*  --   CREATININE 14.17*  14.27* 14.46*  --  12.66* 12.99* 8.27*    Estimated Creatinine Clearance: 12.5 mL/min (A) (by C-G formula based on SCr of 8.27 mg/dL (H)).    Allergies  Allergen Reactions  . Heparin Other (See Comments)    Per pt he has episodes where he can't speak  . Pioglitazone Swelling    Site of swelling not recalled by patient  . Adhesive [Tape] Other (See Comments)    Causes redness, pt prefers paper tape   . Latex Rash    Redness, also    Vanc 5/18 >> Cefepime 5/18 >>  5/16 MRSA PCR - positive 5/16 C.diff - negative 5/16 GI panel PCR - negative 5/18 UCx -  5/18 BCx -   Marylynn Rigdon D. Mina Marble, PharmD, BCPS, [place] 09/04/2020, 10:16 AM

## 2020-09-04 NOTE — Progress Notes (Signed)
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  He was able to answer questions in the ED and did not have any focal deficits.  Blood pressure was elevated systolics into the 099I.  Labs revealed severe nephric and amount of uremia and azotemia and a BUN/creatinine of 119/17.5.  His COVID testing was negative and chest x-ray is negative for any acute findings.  ED provider spoke lead nephrologist who recommended admission for dialysis.  Prior to him coming to the ED he had been ill and was having vomiting and diarrhea and only could tolerate 1 hour dialysis Monday.  His vomiting has now resolved and is not having abdominal pain.  Because he was significantly uremic he was admitted for dialysis and during his dialysis he became acutely agitated and could not complete more than 1/2 hours of treatment while hospitalized.  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  Nephrology continues to dialyze the patient gently daily and recommending a 2 and half hour session today and in 2 and half hour session tomorrow possibly.  Patient's mental status was back to his baseline however he unfortunately decompensated in Dialysis yesterday and a Rapid Response was called.  **Patient became hypoxic and was septic yesterday and a stat x-ray showed some pulmonary edema.  He was placed on Precedex drip and cultures were obtained and underwent hemodialysis.  His acute hypoxic respiratory failure is now improved  significantly.  Procalcitonin severely elevated in accordance to his severe sepsis.  Unclear source but given his intermittent hemodialysis via his fistula is concern for bacteremia.  We will follow-up on cultures.  His mentation is improving but still somnolent.  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  There was subtle parenchymal volume loss greater than expected for age -EEG done 09/01/20 and showed that the study was suggestive of moderate diffuse encephalopathy which is nonspecific in etiology and no seizures or epileptiform discharges were seen throughout the recording -Presented with a BUN/Cr of 119/17.52 after missing 2.5 Dialysis Sessions -He has had similar spells in the past with dialysis when he was admitted last month -Neurology was consulted and work-up was initiated and felt his presentation was most consistent with dialysis disequilibrium syndrome and recommended no further imaging and recommended compliance with hemodialysis to largely avoid fluctuation of fluid shifts that can be caused by hemodialysis -Nephrology is following and planning slow Dialysis again today and possibly  tomorrow -BUN/Cr has improved to 76/12.66 -Nephrology was planning on gentle HD with a run time of 2.5 hours for  the next few days to get Azotemia down until he decompensated yesterday and had 4 Liters removed today  -He became extremely encephalopathic yesterday likely was multifactorial related to dialysis and on adherence, delirium and sepsis -He was placed on Precedex yesterday for dialysis initiation given his agitation and critical care is planning on stopping yesterday  ESRD with HD MWF Metabolic Acidosis -Patient normally gets dialysis in [facility] in [place] and has missed at least 2-1/2 dialysis sessions and only completed half hour of the session on last Monday -Patient's BUN/Cr is improving from 119/17.52 -> 76/12.66 and today it is 75/12.99 -> 42/8.27 -Patient had a mild Metabolic Acidosis but this is improved as CO2 is now 24, AG of 13, and Chloride of 95 -Avoid Nephrotoxic Medications, Contrast Dyes, Hypotension and Renally adjust medications -Nephrology following and planning for a 2.5 hour Dialysis Session today and possibly tomorrow until Azotemia is improved and resolved  -He had 4 Liters removed this AM and repeating CXR in the AM  -Continue with calcitriol 2.25 mcg p.o. every Monday Wednesday Friday with hemodialysis and continue with ferric citrate 420 mg p.o. 3 times daily with meals -Repeat CMP int he AM   Acute Respiratory Failure with Hypoxia with acute pulmonary edema -In the setting of Pulmonary Edema with possible Concurrent Infection -Had to be put on Precedex to be Dialyzed -Had to be placed on BiPAP and then NRB -Now off of Supplemental O2 -SpO2: 90 % O2 Flow Rate (L/min): 2 L/min FiO2 (%): 60 %  -Continuous Pulse Oximetry and Maintain O2 Saturations >90% -Continue to Monitor Respiratory Status Carefully -Repeat CXR this AM  Severe Sepsis of Unclear Etiology -Patient became Febrile, Tachycardic, Tachypenic with Acute Respiratory Failure with Hypoxia and Altered Mental Status -Checking Blood Cx x2, CXR, Urinalysis and Urine Cx -Started Abx on with IV Vancomycin and  IV Cefepime -Checked PCT and was elevated and went from 1.82 -> 64.33 -WBC went from 6.8 -> 16.9 -Continue to Monitor Cx's and Follow Temperature Carefully -If not improving may need ID assistance.   Hyperkalemia, -Improved with dialysis  -Patient's potassium was 5.2 yesterday and improved to 4.2 this AM -Defer to Nephrology to correct but may try some Lokelma  -Continue monitor and trend and repeat CMP in the a.m.  Hyponatremia -Mild. Patient's Na+ is now 132 -Expect to correct in Dialysis -Continue to Monitor and Trend -Repeat CMP in the AM   Hypertensive Urgency with likely flash pulmonary edema -Blood pressure was elevated to systolics in the 263F on admission -Resumed home blood pressure medications including carvedilol 25 mg p.o. twice daily and with irbesartan 150 mg p.o. daily -Continue monitor blood pressures per protocol -Last blood pressure reading was 126/76 -Continue IV hydralazine 5 mg every 4 as needed for systolic blood pressure greater than 180 -Also improving with dialysis  Diarrhea, improved  -Likely viral gastroenteritis-has not been on any recent antibiotics or has any reported fevers -His nausea vomiting and diarrhea has resolved -His GI pathogen panel and C. difficile was negative -Continue to treat supportively with antidiarrheals if necessary  Diabetes Mellitus Type 2 -Recent hemoglobin A1c was 6.4 -Continue with very sensitive NovoLog sliding scale insulin before meals and at bedtime -Continue monitor blood sugars carefully and per protocol; CBGs ranging from 143-183  Hyperlipidemia -Continue with Pravastatin 80 mg p.o. every evening  Anemia of Chronic Kidney Disease -Stable. Patient's Hgb/Hct  is now 9.2/27.3 today -Nephrology ordering Darbepoetin Alfa 60 mcg weekly wile hospitalized and his first dose was Wednesday, 09/03/2020 -Checked Anemia Panel and showed an iron level of 27, U IBC 186, TIBC of 213, saturation ratio 13%, ferritin level 714,  folate of 20.3, and vitamin B12 of 357 -Continue to Monitor for S/Sx of Bleeding; Currently no overt bleeding noted -Repeat CBC in the AM   Obesity -Complicates overall prognosis and care -Estimated body mass index is 33.1 kg/m as calculated from the following:   Height as of this encounter: 6' (1.829 m).   Weight as of this encounter: 110.7 kg. -Weight Loss and Dietary Counseling given   DVT prophylaxis: Heparin 5,000 units sq q8h Code Status: FULL CODE  Family Communication: No family present at bedside  Disposition Plan: Pending further Nephrology Clearance and improvement in his Azotemia and transfer out of the ICU; PCCM had them on a Precedex drip but this is now stopped  Status is: Inpatient  Remains inpatient appropriate because:Unsafe d/c plan, IV treatments appropriate due to intensity of illness or inability to take PO and Inpatient level of care appropriate due to severity of illness   Dispo: The patient is from: Home              Anticipated d/c is to: Home              Patient currently is not medically stable to d/c.   Difficult to place patient No  Consultants:   Nephrology  Neurology  PCCM   Procedures: Hemodialysis   EEG  Antimicrobials:  Anti-infectives (From admission, onward)   Start     Dose/Rate Route Frequency Ordered Stop   09/04/20 1800  ceFEPIme (MAXIPIME) 1 g in sodium chloride 0.9 % 100 mL IVPB        1 g 200 mL/hr over 30 Minutes Intravenous Every 24 hours 09/03/20 1704     09/03/20 1703  vancomycin variable dose per unstable renal function (pharmacist dosing)         Does not apply See admin instructions 09/03/20 1704     09/03/20 1700  vancomycin (VANCOCIN) 2,500 mg in sodium chloride 0.9 % 500 mL IVPB        2,500 mg 250 mL/hr over 120 Minutes Intravenous  Once 09/03/20 1656 09/03/20 2045   09/03/20 1700  ceFEPIme (MAXIPIME) 2 g in sodium chloride 0.9 % 100 mL IVPB        2 g 200 mL/hr over 30 Minutes Intravenous  Once 09/03/20 1656  09/03/20 1900        Subjective: Seen and examined at the bedside and he is somnolent but his mentation is much improved than yesterday given that he was extremely agitated.  His respiratory status improved as well and he is off of any supplemental oxygen.  He admits to having some mild abdominal pain.  No other concerns or complaints at this time.  Objective: Vitals:   09/04/20 0630 09/04/20 0645 09/04/20 0700 09/04/20 0715  BP: (!) 157/77 (!) 149/81 (!) 146/80 133/87  Pulse: 94 93 93 92  Resp: (!) 22 19 (!) 22 (!) 21  Temp:      TempSrc:      SpO2: 94% 95% 95% 97%  Weight:      Height:        Intake/Output Summary (Last 24 hours) at 09/04/2020 0727 Last data filed at 09/04/2020 0416 Gross per 24 hour  Intake 635.66 ml  Output 420 ml  Net 215.66  ml   Filed Weights   09/03/20 1445 09/03/20 1540 09/04/20 0430  Weight: 113.2 kg 113 kg 110.7 kg   Examination: Physical Exam:  Constitutional: WN/WD obese Caucasian male currently in no acute distress appears a little somnolent but does appear calm and not as anxious Eyes: Lids and conjunctivae normal, sclerae anicteric  ENMT: External Ears, Nose appear normal. Grossly normal hearing. Neck: Appears normal, supple, no cervical masses, normal ROM, no appreciable thyromegaly; no JVD Respiratory: Diminished to auscultation bilaterally, no wheezing, rales, rhonchi or crackles. Normal respiratory effort and patient is not tachypenic. No accessory muscle use. Unlabored Breathing Cardiovascular: RRR, no murmurs / rubs / gallops. S1 and S2 auscultated. 1+ LE Edema. No carotid bruits.  Abdomen: Soft, non-tender, non-distended. Bowel sounds positive.  GU: Deferred. Musculoskeletal: No clubbing / cyanosis of digits/nails. No joint deformity upper and lower extremities. Patient has a left forearm AV Fistuala Skin: Has LE venous Stasis changes and hemosiderin deposition  Neurologic: CN 2-12 grossly intact with no focal deficits. Romberg sign  and cerebellar reflexes not assessed.  Psychiatric: Normal judgment and insight. A little somnolent but not as agitated yesterday. Normal mood and appropriate affect.     Data Reviewed: I have personally reviewed following labs and imaging studies  CBC: Recent Labs  Lab 08/31/20 2112 09/01/20 1758 09/02/20 0134 09/02/20 1300 09/03/20 0938 09/04/20 0153  WBC 9.3 5.9 5.7 5.7 6.8 16.9*  NEUTROABS 7.3  --   --   --  5.4 16.0*  HGB 10.1* 9.0* 8.6* 8.5* 9.2* 9.2*  HCT 29.9* 25.8* 25.5* 25.7* 27.1* 27.3*  MCV 90.3 89.0 89.8 90.5 90.9 91.0  PLT 199 176 162 188 230 119   Basic Metabolic Panel: Recent Labs  Lab 08/31/20 2112 09/01/20 1758 09/02/20 0134 09/03/20 0938 09/04/20 0153  NA 131* 134*  133* 133* 132* 133*  K 5.7* 4.6  4.5 4.7 4.7 5.2*  CL 96* 96*  96* 98 97* 98  CO2 16* 22  22 21* 21* 21*  GLUCOSE 119* 157*  157* 105* 154* 189*  BUN 119* 89*  90* 96* 76* 75*  CREATININE 17.52* 14.17*  14.27* 14.46* 12.66* 12.99*  CALCIUM 9.0 8.5*  8.5* 8.3* 8.5* 8.5*  MG  --   --   --  2.3 2.0  PHOS  --  9.1*  --  9.2* 8.3*   GFR: Estimated Creatinine Clearance: 8 mL/min (A) (by C-G formula based on SCr of 12.99 mg/dL (H)). Liver Function Tests: Recent Labs  Lab 08/31/20 2112 09/01/20 1758 09/03/20 0938 09/04/20 0153  AST 13*  --  11* 9*  ALT 13  --  12 11  ALKPHOS 66  --  50 47  BILITOT 0.9  --  0.7 1.0  PROT 7.0  --  6.3* 6.3*  ALBUMIN 3.4* 2.9* 3.0* 2.8*   No results for input(s): LIPASE, AMYLASE in the last 168 hours. Recent Labs  Lab 08/31/20 2341  AMMONIA 16   Coagulation Profile: No results for input(s): INR, PROTIME in the last 168 hours. Cardiac Enzymes: No results for input(s): CKTOTAL, CKMB, CKMBINDEX, TROPONINI in the last 168 hours. BNP (last 3 results) No results for input(s): PROBNP in the last 8760 hours. HbA1C: No results for input(s): HGBA1C in the last 72 hours. CBG: Recent Labs  Lab 09/03/20 1104 09/03/20 1346 09/03/20 1715  09/03/20 1827 09/03/20 2156  GLUCAP 159* 156* 131* 143* 155*   Lipid Profile: No results for input(s): CHOL, HDL, LDLCALC, TRIG, CHOLHDL, LDLDIRECT in the last  72 hours. Thyroid Function Tests: No results for input(s): TSH, T4TOTAL, FREET4, T3FREE, THYROIDAB in the last 72 hours. Anemia Panel: Recent Labs    09/04/20 0153  VITAMINB12 357  FOLATE 20.3  FERRITIN 714*  TIBC 213*  IRON 27*  RETICCTPCT 0.9   Sepsis Labs: Recent Labs  Lab 09/03/20 1816 09/04/20 0153  PROCALCITON 1.82 64.33    Recent Results (from the past 240 hour(s))  Resp Panel by RT-PCR (Flu A&B, Covid) Nasopharyngeal Swab     Status: None   Collection Time: 08/31/20 11:43 PM   Specimen: Nasopharyngeal Swab; Nasopharyngeal(NP) swabs in vial transport medium  Result Value Ref Range Status   SARS Coronavirus 2 by RT PCR NEGATIVE NEGATIVE Final    Comment: (NOTE) SARS-CoV-2 target nucleic acids are NOT DETECTED.  The SARS-CoV-2 RNA is generally detectable in upper respiratory specimens during the acute phase of infection. The lowest concentration of SARS-CoV-2 viral copies this assay can detect is 138 copies/mL. A negative result does not preclude SARS-Cov-2 infection and should not be used as the sole basis for treatment or other patient management decisions. A negative result may occur with  improper specimen collection/handling, submission of specimen other than nasopharyngeal swab, presence of viral mutation(s) within the areas targeted by this assay, and inadequate number of viral copies(<138 copies/mL). A negative result must be combined with clinical observations, patient history, and epidemiological information. The expected result is Negative.  Fact Sheet for Patients:  EntrepreneurPulse.com.au  Fact Sheet for Healthcare Providers:  IncredibleEmployment.be  This test is no t yet approved or cleared by the [place] FDA and  has been authorized for  detection and/or diagnosis of SARS-CoV-2 by FDA under an Emergency Use Authorization (EUA). This EUA will remain  in effect (meaning this test can be used) for the duration of the COVID-19 declaration under Section 564(b)(1) of the Act, 21 U.S.C.section 360bbb-3(b)(1), unless the authorization is terminated  or revoked sooner.       Influenza A by PCR NEGATIVE NEGATIVE Final   Influenza B by PCR NEGATIVE NEGATIVE Final    Comment: (NOTE) The Xpert Xpress SARS-CoV-2/FLU/RSV plus assay is intended as an aid in the diagnosis of influenza from Nasopharyngeal swab specimens and should not be used as a sole basis for treatment. Nasal washings and aspirates are unacceptable for Xpert Xpress SARS-CoV-2/FLU/RSV testing.  Fact Sheet for Patients: EntrepreneurPulse.com.au  Fact Sheet for Healthcare Providers: IncredibleEmployment.be  This test is not yet approved or cleared by the [place] FDA and has been authorized for detection and/or diagnosis of SARS-CoV-2 by FDA under an Emergency Use Authorization (EUA). This EUA will remain in effect (meaning this test can be used) for the duration of the COVID-19 declaration under Section 564(b)(1) of the Act, 21 U.S.C. section 360bbb-3(b)(1), unless the authorization is terminated or revoked.  Performed at [facility], [place] [address], [place], [place] [address]   Gastrointestinal Panel by PCR , Stool     Status: None   Collection Time: 09/01/20  4:05 AM   Specimen: Stool  Result Value Ref Range Status   Campylobacter species NOT DETECTED NOT DETECTED Final   Plesimonas shigelloides NOT DETECTED NOT DETECTED Final   Salmonella species NOT DETECTED NOT DETECTED Final   Yersinia enterocolitica NOT DETECTED NOT DETECTED Final   Vibrio species NOT DETECTED NOT DETECTED Final   Vibrio cholerae NOT DETECTED NOT DETECTED Final   Enteroaggregative E coli (EAEC) NOT DETECTED NOT DETECTED Final    Enteropathogenic E coli (EPEC)  NOT DETECTED NOT DETECTED Final   Enterotoxigenic E coli (ETEC) NOT DETECTED NOT DETECTED Final   Shiga like toxin producing E coli (STEC) NOT DETECTED NOT DETECTED Final   Shigella/Enteroinvasive E coli (EIEC) NOT DETECTED NOT DETECTED Final   Cryptosporidium NOT DETECTED NOT DETECTED Final   Cyclospora cayetanensis NOT DETECTED NOT DETECTED Final   Entamoeba histolytica NOT DETECTED NOT DETECTED Final   Giardia lamblia NOT DETECTED NOT DETECTED Final   Adenovirus F40/41 NOT DETECTED NOT DETECTED Final   Astrovirus NOT DETECTED NOT DETECTED Final   Norovirus GI/GII NOT DETECTED NOT DETECTED Final   Rotavirus A NOT DETECTED NOT DETECTED Final   Sapovirus (I, II, IV, and V) NOT DETECTED NOT DETECTED Final    Comment: Performed at [facility], [address] [place], [place] [address]  C Difficile Quick Screen w PCR reflex     Status: None   Collection Time: 09/01/20 12:03 PM   Specimen: STOOL  Result Value Ref Range Status   C Diff antigen NEGATIVE NEGATIVE Final   C Diff toxin NEGATIVE NEGATIVE Final   C Diff interpretation No C. difficile detected.  Final    Comment: Performed at [facility], [place] [address], [place], [place] [address]  MRSA PCR Screening     Status: Abnormal   Collection Time: 09/01/20 12:15 PM   Specimen: Nasopharyngeal  Result Value Ref Range Status   MRSA by PCR POSITIVE (A) NEGATIVE Final    Comment:        The GeneXpert MRSA Assay (FDA approved for NASAL specimens only), is one component of a comprehensive MRSA colonization surveillance program. It is not intended to diagnose MRSA infection nor to guide or monitor treatment for MRSA infections. RESULT CALLED TO, READ BACK BY AND VERIFIED WITH: A CREWS RN [id number] 09/01/20 A BROWNING Performed at [facility], [place] [address], [place], [place] [address]      RN Pressure Injury Documentation:     Estimated body mass index is 33.1 kg/m as calculated  from the following:   Height as of this encounter: 6' (1.829 m).   Weight as of this encounter: 110.7 kg.  Malnutrition Type:   Malnutrition Characteristics:   Nutrition Interventions:    Radiology Studies: DG Chest Port 1 View  Result Date: 09/04/2020 CLINICAL DATA:  Respiratory failure.  Hypoxia. EXAM: PORTABLE CHEST 1 VIEW COMPARISON:  09/03/2020. FINDINGS: Mediastinum and hilar structures normal. Cardiomegaly and pulmonary venous congestion again noted. Low lung volumes. Interim partial clearing of interstitial edema/infiltrates. No pleural effusion or pneumothorax. Left costophrenic angle incompletely imaged. IMPRESSION: Cardiomegaly and pulmonary venous congestion again noted. Low lung volumes. Interim partial clearing of interstitial edema/infiltrates. Electronically Signed   By: Marcello Moores  Register   On: 09/04/2020 05:35   DG CHEST PORT 1 VIEW  Result Date: 09/03/2020 CLINICAL DATA:  Shortness of breath EXAM: PORTABLE CHEST 1 VIEW COMPARISON:  Radiograph 08/31/2016 FINDINGS: Diffuse fluffy reticulonodular and patchy opacities are present throughout both lungs with developing pulmonary vascular congestion and redistribution as well as fissural and septal thickening. Cardiac silhouette is prominent though may be accentuated by low volumes and portable technique. No acute osseous or soft tissue abnormality. Telemetry leads overlie the chest. IMPRESSION: Worsening pulmonary vascular congestion and features of interstitial edema. More hazy reticulonodular opacities, likely reflect developing alveolar edema though underlying or superimposed infection is difficult to exclude in the appropriate clinical setting. Electronically Signed   By: Lovena Le M.D.   On: 09/03/2020 16:24  Scheduled Meds: . calcitRIOL  2.25 mcg Oral Q M,W,F-HD  . carvedilol  25 mg Oral BID WC  . Chlorhexidine Gluconate Cloth  6 each Topical Q0600  . Chlorhexidine Gluconate Cloth  6 each Topical Q0600  . darbepoetin  (ARANESP) injection - DIALYSIS  60 mcg Intravenous Q Wed-HD  . ferric citrate  420 mg Oral TID WC  . heparin  5,000 Units Subcutaneous Q8H  . insulin aspart  0-5 Units Subcutaneous QHS  . insulin aspart  0-6 Units Subcutaneous TID WC  . irbesartan  150 mg Oral Daily  . mupirocin ointment  1 application Nasal BID  . pantoprazole  40 mg Oral Q1200  . pravastatin  80 mg Oral QPM  . vancomycin variable dose per unstable renal function (pharmacist dosing)   Does not apply See admin instructions   Continuous Infusions: . ceFEPime (MAXIPIME) IV    . dexmedetomidine (PRECEDEX) IV infusion 0.6 mcg/kg/hr (09/04/20 0455)     LOS: 2 days   Kerney Elbe, DO [facility] PAGER is on [place]  If 7PM-7AM, please contact night-coverage [email or web address]

## 2020-09-04 NOTE — Progress Notes (Signed)
[facility] Progress Note  Subjective: went to ICU, had HD early am today w/ 4 L off, CXR mid HD showing lessening of edema  Vitals:   09/04/20 0700 09/04/20 0715 09/04/20 0730 09/04/20 0740  BP: (!) 146/80 133/87 131/82 (!) 144/47  Pulse: 93 92 91 96  Resp: (!) 22 (!) 21 19 (!) 22  Temp:      TempSrc:      SpO2: 95% 97% 96% 97%  Weight:      Height:        Exam:   alert, nad   no jvd  Chest mostly CTAB, breath sounds decreased on R, nml WOB on RA  Cor reg no RG  Abd soft ntnd no ascites   Ext no LE edema, chronic skin changes   Alert, NF, Ox2   LFA AVF+bruit    OP HD: MWF [facility]  4h 68min   400/500  111.5kg  2/2 bath P2 Hep none  - venofer 50 /wk  - mircera 75 q4, last 4/20 due 5/18  - calcitriol 2.25 ug tiw  - home meds: auryxia 2 ac tid  Assessment/ Plan: 1. Acute resp failure - pulm edema, +/- infection, HD this am 4 L off. Repeat CXR this afternoon. Breathing improved on exam.  2. Fever - 103 yest, IV abx per primary 3. AMS - presumed uremia d/t missed outpatient HD, resolved 4. ESRD - HD MWF. Had HD this am. Next HD Friday or Sat.  Continues to have issues with AMS during dialysis.  5. HTN/vol  - pulm edema yest, repeat CXR ordered for this afternoon.   6. Anemia ckd - next esa due Wed, have ordered darbe 60ug weekly while here.  7. MBD ckd - CorrCa/ P are 9.3/ 9.1. Cont vdra, binder     Rob Schertz 09/04/2020, 8:03 AM   Recent Labs  Lab 09/03/20 0938 09/04/20 0153  K 4.7 5.2*  BUN 76* 75*  CREATININE 12.66* 12.99*  CALCIUM 8.5* 8.5*  PHOS 9.2* 8.3*  HGB 9.2* 9.2*   Inpatient medications: . calcitRIOL  2.25 mcg Oral Q M,W,F-HD  . carvedilol  25 mg Oral BID WC  . Chlorhexidine Gluconate Cloth  6 each Topical Q0600  . Chlorhexidine Gluconate Cloth  6 each Topical Q0600  . darbepoetin (ARANESP) injection - DIALYSIS  60 mcg Intravenous Q Wed-HD  . ferric citrate  420 mg Oral TID WC  . heparin  5,000 Units Subcutaneous Q8H  . insulin  aspart  0-5 Units Subcutaneous QHS  . insulin aspart  0-6 Units Subcutaneous TID WC  . irbesartan  150 mg Oral Daily  . mupirocin ointment  1 application Nasal BID  . pantoprazole  40 mg Oral Q1200  . pravastatin  80 mg Oral QPM  . vancomycin variable dose per unstable renal function (pharmacist dosing)   Does not apply See admin instructions   . ceFEPime (MAXIPIME) IV    . dexmedetomidine (PRECEDEX) IV infusion 0.6 mcg/kg/hr (09/04/20 0455)   acetaminophen **OR** acetaminophen, labetalol, loperamide, LORazepam

## 2020-09-04 NOTE — Plan of Care (Signed)
  Problem: Clinical Measurements: Goal: Will remain free from infection Outcome: Progressing   Problem: Activity: Goal: Risk for activity intolerance will decrease Outcome: Progressing   Problem: Safety: Goal: Ability to remain free from injury will improve Outcome: Progressing   Problem: Skin Integrity: Goal: Risk for impaired skin integrity will decrease Outcome: Progressing   

## 2020-09-04 NOTE — Progress Notes (Signed)
NAME:  Scott Castillo, MRN:  [id number], DOB:  Sep 04, 1960, LOS: 2 ADMISSION DATE:  08/31/2020, CONSULTATION DATE:  09/03/2020 REFERRING MD:  Dr. Alfredia Ferguson CHIEF COMPLAINT:  AMS/respiratory distress   History of Present Illness:  Patient is a 60 year old male with pertinent past medical history of end-stage renal disease on HD Monday Wednesday Friday with history of non compliance, type 2 diabetes, hypertension, hyperlipidemia.  Patient admitted on 08/31/2020 with chief complaint of altered mental status after missing dialysis x2.  Admitted to [facility] for dialysis to optimize volume status and manage uremia, course complicated by likely dialysis disequilibrium. On 5/18 during HD patient became acutely agitated/confused and SOB requiring BiPAP as well as anxiolysis. Noted to also have a new fever T-max 103. HD terminated in setting of respiratory distress. PCCM consulted due to increased confusion/agitation disrupting dialysis and hypoxia requiring 100% NRB.  Labs at time of admission: Na 131, K 5.7, Glucose 119, BUN 119, Creatinine, 17.52, Albumin 3.4, AST 13, ALT 13, Alk Phos 66, Anion Gap 19, WBC 9.3  Past Medical History:   Past Medical History:  Diagnosis Date  . C7 radiculopathy   . Carpal tunnel syndrome, bilateral    by EMG  R>L  . 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 H/O pancreatic cancer   . HLD (hyperlipidemia)   . HTN (hypertension)   . Lumbar radiculopathy, chronic    hemilamectomy '94  . Venous stasis dermatitis of both lower extremities     Significant Hospital Events:   5/15: Admitted  5/18: Patient confused and on 100% NRB pulling off mask while in dialysis. Patient would not keep BiPAP on. Transported to ICU to give Precedex. Patient hypertensive/tachycardic and started Labetolol.   Consults:  Nephro  Procedures:  None  Significant Diagnostic Tests:  - CT head no acute intracranial abnormality  - CXR shows slight  improvement of interstitial edema/infiltrates   Micro Data:  - Blood and urine cultures pending - GI Panel and c diff negative  - MRSA +  Antimicrobials:  Cefepime 5/18> Vancomycin 5/18>  Interim History / Subjective:   Overnight became SOB, febrile, tachycardic and confused. Presented to the ICU for precedex drip to be dialyzed.   Seen on room air this morning, lethargic but easy to wake. Denies any pain. Resting comfortably.   Objective   Blood pressure 131/82, pulse 91, temperature 98.5 F (36.9 C), resp. rate 19, height 6' (1.829 m), weight 110.7 kg, SpO2 96 %.    FiO2 (%):  [60 %-100 %] 60 %   Intake/Output Summary (Last 24 hours) at 09/04/2020 0752 Last data filed at 09/04/2020 0740 Gross per 24 hour  Intake 635.66 ml  Output 4420 ml  Net -3784.34 ml   Filed Weights   09/03/20 1445 09/03/20 1540 09/04/20 0430  Weight: 113.2 kg 113 kg 110.7 kg    Examination: General: ill appearing male, comfortably resting HEENT: MM pink/moist, on room air Neuro: lethargic, easy to wake, oriented x3, following commands CV: s1s2, no m/r/g, RRR PULM:  Diminished/ Crackles BS throughout, normal work of breathing GI: soft, bsx4 active  Extremities: warm/dry, no edema, Left forearm fistula with good thrill, right hand swelling/erythema, BLE darker likely chronic venous insufficiency Skin: no rashes or lesions   Resolved Hospital Problem list   Acute hypoxic respiratory failure  Assessment & Plan:  59 year old male with end-stage renal disease on dialysis, noncompliance with treatment who is admitted with altered mental status after missing dialysis.  Admitted to the ICU due to worsening mentation and hypoxia.   Acute Respiratory Failure with hypoxia, resolved - Likely pulmonary edema, stable on room air now  - Repeat chest radiograph shows improvement of pulmonary edema - HD as below  - O2 for sat goal 92-95%  ESRD Dialysis Disequilibrium - HD on 5/18 got cut short due to  SOB - Completed HD early this morning  - Volume status has improved  Acute metabolic encephalopathy - Mentation significantly improved,  - Likely due to uremia vs. Infection (febrile, leukocytosis, elevated procal) - Will stop precedex today  - HD as above - Workup for possible sepsis as below - Follow up UDS  Sepsis, suspected Febrile yesterday, tmax 103. Leukocytosis, 16.9. Procalcitonin elevated. Unclear source, cultures pending. MRSA PCR positive.  - Started on vancomycin and cefepime. - Follow up blood cultures  Mild hyponatremia Hypervolemic hyponatremia - HD as above - trend BMP   Anemia of chronic disease from CKD - Trend CBC - Iron supplementation and EPO   Diabetes T2 - SSI  - CBG monitoring  HTN, with hypertensive urgency - PRN labetolol - Will continue telemetry  - Continue scheduled anti-hypertensives - Stop precedex  HLD - Statin   Best practice (evaluated daily)  Diet: Regular Pain/Anxiety/Delirium protocol (if indicated): precedex, ativan prn VAP protocol (if indicated): n/a DVT prophylaxis: heparin GI prophylaxis: PPI Glucose control: SSI Mobility: bedrest Disposition: ICU  Goals of Care:  Last date of multidisciplinary goals of care discussion:5/19 Family and staff present: n/a Summary of discussion: continue current medical care Follow up goals of care discussion due: n/a Code Status: Full  Labs   CBC: Recent Labs  Lab 08/31/20 2112 09/01/20 1758 09/02/20 0134 09/02/20 1300 09/03/20 0938 09/04/20 0153  WBC 9.3 5.9 5.7 5.7 6.8 16.9*  NEUTROABS 7.3  --   --   --  5.4 16.0*  HGB 10.1* 9.0* 8.6* 8.5* 9.2* 9.2*  HCT 29.9* 25.8* 25.5* 25.7* 27.1* 27.3*  MCV 90.3 89.0 89.8 90.5 90.9 91.0  PLT 199 176 162 188 230 270    Basic Metabolic Panel: Recent Labs  Lab 08/31/20 2112 09/01/20 1758 09/02/20 0134 09/03/20 0938 09/04/20 0153  NA 131* 134*  133* 133* 132* 133*  K 5.7* 4.6  4.5 4.7 4.7 5.2*  CL 96* 96*  96* 98  97* 98  CO2 16* 22  22 21* 21* 21*  GLUCOSE 119* 157*  157* 105* 154* 189*  BUN 119* 89*  90* 96* 76* 75*  CREATININE 17.52* 14.17*  14.27* 14.46* 12.66* 12.99*  CALCIUM 9.0 8.5*  8.5* 8.3* 8.5* 8.5*  MG  --   --   --  2.3 2.0  PHOS  --  9.1*  --  9.2* 8.3*   GFR: Estimated Creatinine Clearance: 8 mL/min (A) (by C-G formula based on SCr of 12.99 mg/dL (H)). Recent Labs  Lab 09/02/20 0134 09/02/20 1300 09/03/20 0938 09/03/20 1816 09/04/20 0153  PROCALCITON  --   --   --  1.82 64.33  WBC 5.7 5.7 6.8  --  16.9*    Liver Function Tests: Recent Labs  Lab 08/31/20 2112 09/01/20 1758 09/03/20 0938 09/04/20 0153  AST 13*  --  11* 9*  ALT 13  --  12 11  ALKPHOS 66  --  50 47  BILITOT 0.9  --  0.7 1.0  PROT 7.0  --  6.3* 6.3*  ALBUMIN 3.4* 2.9* 3.0* 2.8*   No results for input(s): LIPASE, AMYLASE in the last  168 hours. Recent Labs  Lab 08/31/20 2341  AMMONIA 16    ABG    Component Value Date/Time   PHART 7.464 (H) 09/01/2020 1650   PCO2ART 30.0 (L) 09/01/2020 1650   PO2ART 67.1 (L) 09/01/2020 1650   HCO3 21.3 09/01/2020 1650   TCO2 24 06/16/2020 1420   ACIDBASEDEF 2.0 09/01/2020 1650   O2SAT 93.0 09/01/2020 1650     Coagulation Profile: No results for input(s): INR, PROTIME in the last 168 hours.  Cardiac Enzymes: No results for input(s): CKTOTAL, CKMB, CKMBINDEX, TROPONINI in the last 168 hours.  HbA1C: Hgb A1c MFr Bld  Date/Time Value Ref Range Status  08/19/2020 04:18 AM 6.4 (H) 4.8 - 5.6 % Final    Comment:    (NOTE) Pre diabetes:          5.7%-6.4%  Diabetes:              >6.4%  Glycemic control for   <7.0% adults with diabetes   06/17/2020 02:46 AM 8.0 (H) 4.8 - 5.6 % Final    Comment:    (NOTE) Pre diabetes:          5.7%-6.4%  Diabetes:              >6.4%  Glycemic control for   <7.0% adults with diabetes     CBG: Recent Labs  Lab 09/03/20 1346 09/03/20 1715 09/03/20 1827 09/03/20 2156 09/04/20 0730  GLUCAP 156* 131*  143* 155* 149*     Past Medical History:  He,  has a past medical history of C7 radiculopathy, Carpal tunnel syndrome, bilateral, DM (diabetes mellitus), type 2 with neurological complications ([place]), H/O pancreatic cancer, HLD (hyperlipidemia), HTN (hypertension), Lumbar radiculopathy, chronic, and Venous stasis dermatitis of both lower extremities.   Surgical History:   Past Surgical History:  Procedure Laterality Date  . A/V FISTULAGRAM Left 09/27/2019   Procedure: A/V FISTULAGRAM;  Surgeon: Marty Heck, MD;  Location: [place] [facility];  Service: Cardiovascular;  Laterality: Left;  . AV FISTULA PLACEMENT Left 09/17/2016   Procedure: ARTERIOVENOUS (AV) FISTULA CREATION;  Surgeon: [name], MD;  Location: [place];  Service: Vascular;  Laterality: Left;  . CATARACT EXTRACTION W/ INTRAOCULAR LENS IMPLANT Left 2015  . ENDOVENOUS ABLATION SAPHENOUS VEIN W/ LASER Right 04/08/2016   EVLA R greater saphenous vein by Curt Jews MD  . ENDOVENOUS ABLATION SAPHENOUS VEIN W/ LASER Left 05/13/2016   endovenous laser ablation (left greater saphenous vein) by Curt Jews MD   . FOOT SURGERY    . HEMILAMINOTOMY LUMBAR SPINE  '94  . TONSILLECTOMY       Social History:   reports that he has never smoked. He has never used smokeless tobacco. He reports that he does not drink alcohol and does not use drugs.   Family History:  His family history includes Edema in his mother; Heart attack in his brother, brother, and father; Heart failure in his father.   Allergies Allergies  Allergen Reactions  . Heparin Other (See Comments)    Per pt he has episodes where he can't speak  . Pioglitazone Swelling    Site of swelling not recalled by patient  . Adhesive [Tape] Other (See Comments)    Causes redness, pt prefers paper tape   . Latex Rash    Redness, also     Home Medications  Prior to Admission medications   Medication Sig Start Date End Date Taking? Authorizing Provider  acetaminophen  (TYLENOL) 325 MG tablet  Take 2 tablets (650 mg total) by mouth every 6 (six) hours as needed for mild pain (or Fever >/= 101). 08/19/20  Yes Domenic Polite, MD  ascorbic acid (VITAMIN C) 1000 MG tablet Take 1,000 mg by mouth daily.   Yes [provider]  carvedilol (COREG) 25 MG tablet Take 25 mg by mouth 2 (two) times daily with a meal.  07/28/15  Yes [provider]  cyanocobalamin 1000 MCG tablet Take 1,000 mcg by mouth daily.   Yes [provider]  ferric citrate (AURYXIA) 1 GM 210 MG(Fe) tablet Take 210-420 mg by mouth See admin instructions. Take 2 tablets (420 mg) by mouth 3 times daily with meals and 1 tablet (210 mg) with snacks   Yes [provider]  Insulin Pen Needle (BD ULTRA-FINE PEN NEEDLES) 29G X 12.7MM MISC 1 each by Other route as directed. 09/22/19  Yes [provider]  irbesartan (AVAPRO) 150 MG tablet Take 150 mg by mouth daily. 09/05/19  Yes [provider]  pravastatin (PRAVACHOL) 80 MG tablet Take 80 mg by mouth every evening.    Yes [provider]  Semaglutide (RYBELSUS) 14 MG TABS Take 14 mg by mouth daily. 10/05/18  Yes [provider]  torsemide (DEMADEX) 100 MG tablet Take 100 mg by mouth See admin instructions. Takes on Tuesday,Thursday,Saturday and Sunday(non-dialysis days) 02/20/20  Yes [provider]     Harlow Ohms, DO PGY-2 IMTS

## 2020-09-05 ENCOUNTER — Inpatient Hospital Stay (HOSPITAL_COMMUNITY): Payer: Medicare Other

## 2020-09-05 DIAGNOSIS — I809 Phlebitis and thrombophlebitis of unspecified site: Secondary | ICD-10-CM | POA: Diagnosis not present

## 2020-09-05 DIAGNOSIS — B9562 Methicillin resistant Staphylococcus aureus infection as the cause of diseases classified elsewhere: Secondary | ICD-10-CM | POA: Diagnosis not present

## 2020-09-05 DIAGNOSIS — E875 Hyperkalemia: Secondary | ICD-10-CM | POA: Diagnosis not present

## 2020-09-05 DIAGNOSIS — R7881 Bacteremia: Secondary | ICD-10-CM | POA: Diagnosis not present

## 2020-09-05 DIAGNOSIS — I160 Hypertensive urgency: Secondary | ICD-10-CM | POA: Diagnosis not present

## 2020-09-05 DIAGNOSIS — E1169 Type 2 diabetes mellitus with other specified complication: Secondary | ICD-10-CM | POA: Diagnosis not present

## 2020-09-05 DIAGNOSIS — N186 End stage renal disease: Secondary | ICD-10-CM | POA: Diagnosis not present

## 2020-09-05 LAB — ECHOCARDIOGRAM COMPLETE
AR max vel: 3.24 cm2
AV Area VTI: 3.57 cm2
AV Area mean vel: 3.27 cm2
AV Mean grad: 4 mmHg
AV Peak grad: 7.1 mmHg
Ao pk vel: 1.33 m/s
Area-P 1/2: 3.3 cm2
Calc EF: 43.8 %
Height: 72 in
MV VTI: 2.95 cm2
S' Lateral: 3.8 cm
Single Plane A2C EF: 43 %
Single Plane A4C EF: 43.9 %
Weight: 3746.06 oz

## 2020-09-05 LAB — COMPREHENSIVE METABOLIC PANEL
ALT: 12 U/L (ref 0–44)
AST: 11 U/L — ABNORMAL LOW (ref 15–41)
Albumin: 2.6 g/dL — ABNORMAL LOW (ref 3.5–5.0)
Alkaline Phosphatase: 46 U/L (ref 38–126)
Anion gap: 11 (ref 5–15)
BUN: 67 mg/dL — ABNORMAL HIGH (ref 6–20)
CO2: 23 mmol/L (ref 22–32)
Calcium: 8.7 mg/dL — ABNORMAL LOW (ref 8.9–10.3)
Chloride: 95 mmol/L — ABNORMAL LOW (ref 98–111)
Creatinine, Ser: 11.02 mg/dL — ABNORMAL HIGH (ref 0.61–1.24)
GFR, Estimated: 5 mL/min — ABNORMAL LOW (ref >60)
Glucose, Bld: 150 mg/dL — ABNORMAL HIGH (ref 70–99)
Potassium: 4.4 mmol/L (ref 3.5–5.1)
Sodium: 129 mmol/L — ABNORMAL LOW (ref 135–145)
Total Bilirubin: 0.7 mg/dL (ref 0.3–1.2)
Total Protein: 5.9 g/dL — ABNORMAL LOW (ref 6.5–8.1)

## 2020-09-05 LAB — GLUCOSE, CAPILLARY
Glucose-Capillary: 146 mg/dL — ABNORMAL HIGH (ref 70–99)
Glucose-Capillary: 190 mg/dL — ABNORMAL HIGH (ref 70–99)
Glucose-Capillary: 179 mg/dL — ABNORMAL HIGH (ref 70–99)
Glucose-Capillary: 163 mg/dL — ABNORMAL HIGH (ref 70–99)

## 2020-09-05 LAB — PROCALCITONIN: Procalcitonin: 133.23 ng/mL

## 2020-09-05 LAB — CBC WITH DIFFERENTIAL/PLATELET
Abs Immature Granulocytes: 0.03 10*3/uL (ref 0.00–0.07)
Basophils Absolute: 0 10*3/uL (ref 0.0–0.1)
Basophils Relative: 0 %
Eosinophils Absolute: 0 10*3/uL (ref 0.0–0.5)
Eosinophils Relative: 0 %
HCT: 26.9 % — ABNORMAL LOW (ref 39.0–52.0)
Hemoglobin: 8.8 g/dL — ABNORMAL LOW (ref 13.0–17.0)
Immature Granulocytes: 1 %
Lymphocytes Relative: 4 %
Lymphs Abs: 0.3 10*3/uL — ABNORMAL LOW (ref 0.7–4.0)
MCH: 29.9 pg (ref 26.0–34.0)
MCHC: 32.7 g/dL (ref 30.0–36.0)
MCV: 91.5 fL (ref 80.0–100.0)
Monocytes Absolute: 0.4 10*3/uL (ref 0.1–1.0)
Monocytes Relative: 7 %
Neutro Abs: 5 10*3/uL (ref 1.7–7.7)
Neutrophils Relative %: 88 %
Platelets: 139 10*3/uL — ABNORMAL LOW (ref 150–400)
RBC: 2.94 MIL/uL — ABNORMAL LOW (ref 4.22–5.81)
RDW: 12.8 % (ref 11.5–15.5)
WBC: 5.7 10*3/uL (ref 4.0–10.5)
nRBC: 0 % (ref 0.0–0.2)

## 2020-09-05 LAB — MAGNESIUM: Magnesium: 2.5 mg/dL — ABNORMAL HIGH (ref 1.7–2.4)

## 2020-09-05 LAB — PHOSPHORUS: Phosphorus: 7.1 mg/dL — ABNORMAL HIGH (ref 2.5–4.6)

## 2020-09-05 MED ORDER — VANCOMYCIN HCL IN DEXTROSE 1-5 GM/200ML-% IV SOLN
1000.0000 mg | INTRAVENOUS | Status: DC
  Filled 2020-09-05: qty 200

## 2020-09-05 MED ORDER — PENTAFLUOROPROP-TETRAFLUOROETH EX AERO: 1.0000 "application " | INHALATION_SPRAY | CUTANEOUS | Status: DC | PRN

## 2020-09-05 MED ORDER — FERRIC CITRATE 1 GM 210 MG(FE) PO TABS
630.0000 mg | ORAL_TABLET | Freq: Three times a day (TID) | ORAL | Status: DC
  Administered 2020-09-05 – 2020-09-08 (×9): 630 mg via ORAL
  Filled 2020-09-05 (×11): qty 3

## 2020-09-05 MED ORDER — SODIUM CHLORIDE 0.9 % IV SOLN: 100.0000 mL | INTRAVENOUS | Status: DC | PRN

## 2020-09-05 MED ORDER — VANCOMYCIN HCL 1000 MG/200ML IV SOLN
1000.0000 mg | Freq: Once | INTRAVENOUS | Status: AC
  Administered 2020-09-05: 1000 mg via INTRAVENOUS
  Filled 2020-09-05: qty 200

## 2020-09-05 MED ORDER — CALCITRIOL 0.25 MCG PO CAPS
ORAL_CAPSULE | ORAL | Status: AC
  Filled 2020-09-05: qty 1

## 2020-09-05 MED ORDER — LIDOCAINE-PRILOCAINE 2.5-2.5 % EX CREA: 1.0000 "application " | TOPICAL_CREAM | CUTANEOUS | Status: DC | PRN

## 2020-09-05 MED ORDER — CALCITRIOL 0.5 MCG PO CAPS
ORAL_CAPSULE | ORAL | Status: AC
  Filled 2020-09-05: qty 4

## 2020-09-05 MED ORDER — HEPARIN SODIUM (PORCINE) 1000 UNIT/ML DIALYSIS: 1000.0000 [IU] | INTRAMUSCULAR | Status: DC | PRN

## 2020-09-05 MED ORDER — LIDOCAINE HCL (PF) 1 % IJ SOLN: 5.0000 mL | INTRAMUSCULAR | Status: DC | PRN

## 2020-09-05 NOTE — Progress Notes (Signed)
[facility]  Date of Admission:  08/31/2020      Total days of antibiotic: 2  Vancomycin           ASSESSMENT: Scott Castillo is a 60 y.o. male with MRSA bacteremia. Suspect this was due to phlebitis a/w PIV given sudden onset on day 4 of hospitalization and rapid resolution of fevers and leukocytosis after 24 hours of antibiotics. Repeated blood cultures pending. TTE pending.  I don't think he needs TEE to work up if TTE is OK.  Further recs once all studies available.    PLAN: 1. Continue vancomycin  2. Follow TTE 3. Follow repeat BCx    Principal Problem:   Encephalopathy Active Problems:   Diabetes mellitus type 2 in obese (HCC)   Hypertensive urgency   ESRD (end stage renal disease) (HCC)   Acute metabolic encephalopathy   Hyperkalemia, diminished renal excretion   Acute respiratory failure with hypoxia (HCC)   MRSA bacteremia   . calcitRIOL  2.25 mcg Oral Q M,W,F-HD  . carvedilol  25 mg Oral BID WC  . Chlorhexidine Gluconate Cloth  6 each Topical Q0600  . darbepoetin (ARANESP) injection - DIALYSIS  60 mcg Intravenous Q Wed-HD  . ferric citrate  630 mg Oral TID WC  . heparin  5,000 Units Subcutaneous Q8H  . insulin aspart  0-5 Units Subcutaneous QHS  . insulin aspart  0-6 Units Subcutaneous TID WC  . irbesartan  150 mg Oral Daily  . mupirocin ointment  1 application Nasal BID  . pantoprazole  40 mg Oral Q1200  . pravastatin  80 mg Oral QPM    SUBJECTIVE: Undergoing dialysis today and eating a hearty breakfast.  Feeling much better. No concerns today.  TTE is pending the read.  Afebrile, leukocytosis resolved, hand redness still present but better.    Review of Systems: Review of Systems  Constitutional: Negative for chills, fever and malaise/fatigue.  Respiratory: Negative for cough and shortness of breath.   Cardiovascular: Negative for chest pain.  Gastrointestinal: Negative for abdominal pain, diarrhea, nausea  and vomiting.  Musculoskeletal: Negative for back pain, joint pain and myalgias.  Skin: Positive for rash.  Neurological: Negative for dizziness.     Allergies  Allergen Reactions  . Pioglitazone Swelling    Site of swelling not recalled by patient  . Adhesive [Tape] Other (See Comments)    Causes redness, pt prefers paper tape   . Latex Rash    Redness, also    OBJECTIVE: Vitals:   09/05/20 1100 09/05/20 1130 09/05/20 1137 09/05/20 1214  BP: 132/69 135/65 (!) 142/77 (!) 124/56  Pulse: 87  87 91  Resp:   18 18  Temp:   97.6 F (36.4 C) 97.9 F (36.6 C)  TempSrc:   Oral Oral  SpO2:   96% 94%  Weight:   106.2 kg   Height:       Body mass index is 31.75 kg/m.  Physical Exam Vitals reviewed.  HENT:     Mouth/Throat:     Mouth: Mucous membranes are moist.     Pharynx: Oropharynx is clear.  Eyes:     Pupils: Pupils are equal, round, and reactive to light.  Cardiovascular:     Rate and Rhythm: Normal rate and regular rhythm.     Heart sounds: No murmur heard.   Pulmonary:     Effort: Pulmonary effort is normal.     Breath sounds:  Normal breath sounds.  Abdominal:     General: There is no distension.     Tenderness: There is no abdominal tenderness.  Skin:    Capillary Refill: Capillary refill takes less than 2 seconds.  Neurological:     Mental Status: He is alert and oriented to person, place, and time.   R hand old PIV site improving erythema and tenderness resolved.      Lab Results Lab Results  Component Value Date   WBC 5.7 09/05/2020   HGB 8.8 (L) 09/05/2020   HCT 26.9 (L) 09/05/2020   MCV 91.5 09/05/2020   PLT 139 (L) 09/05/2020    Lab Results  Component Value Date   CREATININE 11.02 (H) 09/05/2020   BUN 67 (H) 09/05/2020   NA 129 (L) 09/05/2020   K 4.4 09/05/2020   CL 95 (L) 09/05/2020   CO2 23 09/05/2020    Lab Results  Component Value Date   ALT 12 09/05/2020   AST 11 (L) 09/05/2020   ALKPHOS 46 09/05/2020   BILITOT 0.7  09/05/2020     Microbiology: Recent Results (from the past 240 hour(s))  Resp Panel by RT-PCR (Flu A&B, Covid) Nasopharyngeal Swab     Status: None   Collection Time: 08/31/20 11:43 PM   Specimen: Nasopharyngeal Swab; Nasopharyngeal(NP) swabs in vial transport medium  Result Value Ref Range Status   SARS Coronavirus 2 by RT PCR NEGATIVE NEGATIVE Final    Comment: (NOTE) SARS-CoV-2 target nucleic acids are NOT DETECTED.  The SARS-CoV-2 RNA is generally detectable in upper respiratory specimens during the acute phase of infection. The lowest concentration of SARS-CoV-2 viral copies this assay can detect is 138 copies/mL. A negative result does not preclude SARS-Cov-2 infection and should not be used as the sole basis for treatment or other patient management decisions. A negative result may occur with  improper specimen collection/handling, submission of specimen other than nasopharyngeal swab, presence of viral mutation(s) within the areas targeted by this assay, and inadequate number of viral copies(<138 copies/mL). A negative result must be combined with clinical observations, patient history, and epidemiological information. The expected result is Negative.  Fact Sheet for Patients:  EntrepreneurPulse.com.au  Fact Sheet for Healthcare Providers:  IncredibleEmployment.be  This test is no t yet approved or cleared by the [place] FDA and  has been authorized for detection and/or diagnosis of SARS-CoV-2 by FDA under an Emergency Use Authorization (EUA). This EUA will remain  in effect (meaning this test can be used) for the duration of the COVID-19 declaration under Section 564(b)(1) of the Act, 21 U.S.C.section 360bbb-3(b)(1), unless the authorization is terminated  or revoked sooner.       Influenza A by PCR NEGATIVE NEGATIVE Final   Influenza B by PCR NEGATIVE NEGATIVE Final    Comment: (NOTE) The Xpert Xpress SARS-CoV-2/FLU/RSV  plus assay is intended as an aid in the diagnosis of influenza from Nasopharyngeal swab specimens and should not be used as a sole basis for treatment. Nasal washings and aspirates are unacceptable for Xpert Xpress SARS-CoV-2/FLU/RSV testing.  Fact Sheet for Patients: EntrepreneurPulse.com.au  Fact Sheet for Healthcare Providers: IncredibleEmployment.be  This test is not yet approved or cleared by the [place] FDA and has been authorized for detection and/or diagnosis of SARS-CoV-2 by FDA under an Emergency Use Authorization (EUA). This EUA will remain in effect (meaning this test can be used) for the duration of the COVID-19 declaration under Section 564(b)(1) of the Act, 21 U.S.C. section 360bbb-3(b)(1), unless the  authorization is terminated or revoked.  Performed at [facility], [place] [address], [place], [place] [address]   Gastrointestinal Panel by PCR , Stool     Status: None   Collection Time: 09/01/20  4:05 AM   Specimen: Stool  Result Value Ref Range Status   Campylobacter species NOT DETECTED NOT DETECTED Final   Plesimonas shigelloides NOT DETECTED NOT DETECTED Final   Salmonella species NOT DETECTED NOT DETECTED Final   Yersinia enterocolitica NOT DETECTED NOT DETECTED Final   Vibrio species NOT DETECTED NOT DETECTED Final   Vibrio cholerae NOT DETECTED NOT DETECTED Final   Enteroaggregative E coli (EAEC) NOT DETECTED NOT DETECTED Final   Enteropathogenic E coli (EPEC) NOT DETECTED NOT DETECTED Final   Enterotoxigenic E coli (ETEC) NOT DETECTED NOT DETECTED Final   Shiga like toxin producing E coli (STEC) NOT DETECTED NOT DETECTED Final   Shigella/Enteroinvasive E coli (EIEC) NOT DETECTED NOT DETECTED Final   Cryptosporidium NOT DETECTED NOT DETECTED Final   Cyclospora cayetanensis NOT DETECTED NOT DETECTED Final   Entamoeba histolytica NOT DETECTED NOT DETECTED Final   Giardia lamblia NOT DETECTED NOT DETECTED Final    Adenovirus F40/41 NOT DETECTED NOT DETECTED Final   Astrovirus NOT DETECTED NOT DETECTED Final   Norovirus GI/GII NOT DETECTED NOT DETECTED Final   Rotavirus A NOT DETECTED NOT DETECTED Final   Sapovirus (I, II, IV, and V) NOT DETECTED NOT DETECTED Final    Comment: Performed at [facility], [address] [place], [place] [address]  C Difficile Quick Screen w PCR reflex     Status: None   Collection Time: 09/01/20 12:03 PM   Specimen: STOOL  Result Value Ref Range Status   C Diff antigen NEGATIVE NEGATIVE Final   C Diff toxin NEGATIVE NEGATIVE Final   C Diff interpretation No C. difficile detected.  Final    Comment: Performed at [facility], [place] [address], [place], [place] [address]  MRSA PCR Screening     Status: Abnormal   Collection Time: 09/01/20 12:15 PM   Specimen: Nasopharyngeal  Result Value Ref Range Status   MRSA by PCR POSITIVE (A) NEGATIVE Final    Comment:        The GeneXpert MRSA Assay (FDA approved for NASAL specimens only), is one component of a comprehensive MRSA colonization surveillance program. It is not intended to diagnose MRSA infection nor to guide or monitor treatment for MRSA infections. RESULT CALLED TO, READ BACK BY AND VERIFIED WITH: A CREWS RN [id number] 09/01/20 A BROWNING Performed at [facility], [place] [address], [place], [place] [address]   Culture, blood (routine x 2)     Status: Abnormal (Preliminary result)   Collection Time: 09/03/20  6:13 PM   Specimen: BLOOD RIGHT HAND  Result Value Ref Range Status   Specimen Description BLOOD RIGHT HAND  Final   Special Requests   Final    BOTTLES DRAWN AEROBIC ONLY Blood Culture adequate volume   Culture  Setup Time   Final    GRAM POSITIVE COCCI IN CLUSTERS AEROBIC BOTTLE ONLY CRITICAL RESULT CALLED TO, READ BACK BY AND VERIFIED WITH: PHARMD MADISON YATES AT 2130 ON 09/04/20 BY KJ    Culture (A)  Final    STAPHYLOCOCCUS AUREUS SUSCEPTIBILITIES TO FOLLOW Performed at  [facility], [place] [address] [place], [place] [address]    Report Status PENDING  Incomplete  Blood Culture ID Panel (Reflexed)     Status:  Abnormal   Collection Time: 09/03/20  6:13 PM  Result Value Ref Range Status   Enterococcus faecalis NOT DETECTED NOT DETECTED Final   Enterococcus Faecium NOT DETECTED NOT DETECTED Final   Listeria monocytogenes NOT DETECTED NOT DETECTED Final   Staphylococcus species DETECTED (A) NOT DETECTED Final    Comment: CRITICAL RESULT CALLED TO, READ BACK BY AND VERIFIED WITH: PHARMD MADSION YATES AT 1325 ON 09/04/20 BY KJ    Staphylococcus aureus (BCID) DETECTED (A) NOT DETECTED Final    Comment: Methicillin (oxacillin)-resistant Staphylococcus aureus (MRSA). MRSA is predictably resistant to beta-lactam antibiotics (except ceftaroline). Preferred therapy is vancomycin unless clinically contraindicated. Patient requires contact precautions if  hospitalized. CRITICAL RESULT CALLED TO, READ BACK BY AND VERIFIED WITH: PHARMD MADISON YATES AT 7322 ON 09/04/20 BY KJ    Staphylococcus epidermidis NOT DETECTED NOT DETECTED Final   Staphylococcus lugdunensis NOT DETECTED NOT DETECTED Final   Streptococcus species NOT DETECTED NOT DETECTED Final   Streptococcus agalactiae NOT DETECTED NOT DETECTED Final   Streptococcus pneumoniae NOT DETECTED NOT DETECTED Final   Streptococcus pyogenes NOT DETECTED NOT DETECTED Final   A.calcoaceticus-baumannii NOT DETECTED NOT DETECTED Final   Bacteroides fragilis NOT DETECTED NOT DETECTED Final   Enterobacterales NOT DETECTED NOT DETECTED Final   Enterobacter cloacae complex NOT DETECTED NOT DETECTED Final   Escherichia coli NOT DETECTED NOT DETECTED Final   Klebsiella aerogenes NOT DETECTED NOT DETECTED Final   Klebsiella oxytoca NOT DETECTED NOT DETECTED Final   Klebsiella pneumoniae NOT DETECTED NOT DETECTED Final   Proteus species NOT DETECTED NOT DETECTED Final   Salmonella species NOT DETECTED NOT DETECTED Final    Serratia marcescens NOT DETECTED NOT DETECTED Final   Haemophilus influenzae NOT DETECTED NOT DETECTED Final   Neisseria meningitidis NOT DETECTED NOT DETECTED Final   Pseudomonas aeruginosa NOT DETECTED NOT DETECTED Final   Stenotrophomonas maltophilia NOT DETECTED NOT DETECTED Final   Candida albicans NOT DETECTED NOT DETECTED Final   Candida auris NOT DETECTED NOT DETECTED Final   Candida glabrata NOT DETECTED NOT DETECTED Final   Candida krusei NOT DETECTED NOT DETECTED Final   Candida parapsilosis NOT DETECTED NOT DETECTED Final   Candida tropicalis NOT DETECTED NOT DETECTED Final   Cryptococcus neoformans/gattii NOT DETECTED NOT DETECTED Final   Meth resistant mecA/C and MREJ DETECTED (A) NOT DETECTED Final    Comment: CRITICAL RESULT CALLED TO, READ BACK BY AND VERIFIED WITH: PHARMD MADISON YATES AT 1325 BY KJ ON 09/03/20 Performed at [facility], [address] [place], [place] [address]   Culture, blood (routine x 2)     Status: Abnormal (Preliminary result)   Collection Time: 09/03/20  6:15 PM   Specimen: BLOOD RIGHT HAND  Result Value Ref Range Status   Specimen Description BLOOD RIGHT HAND  Final   Special Requests   Final    BOTTLES DRAWN AEROBIC ONLY Blood Culture adequate volume   Culture  Setup Time   Final    GRAM POSITIVE COCCI AEROBIC BOTTLE ONLY Performed at [facility], [place] [address] [place], [place] [address]    Culture STAPHYLOCOCCUS AUREUS (A)  Final   Report Status PENDING  Incomplete     Janene Madeira, MSN, NP-C [facility] [email or web address] Pager: [phone] Office: [phone] [place]: [phone]

## 2020-09-05 NOTE — Progress Notes (Signed)
[facility] Progress Note   Subjective:   Patient seen and examined at bedside during dialysis.  Tolerating treatment well so far.  Denies CP, SOB, n/v/d, abdominal pain, confusion, dizziness and fatigue.   Objective Vitals:   09/05/20 0807 09/05/20 0830 09/05/20 0900 09/05/20 0930  BP: 139/84 138/73 (!) 128/52 (!) 142/75  Pulse: 84     Resp: 12     Temp:      TempSrc:      SpO2:      Weight:      Height:       Physical Exam General:chronicially ill appearing male in NAD, AAOx3 Heart:RRR, no mrg Lungs:CTAB anterolaterally, nml WOB on 2L via [place] Abdomen:soft, NTND Extremities:trace edema, +chronic venous stasis /skin changes. Erythema around IV site on R hand Dialysis Access: LU AVF in use   Filed Weights   09/04/20 0430 09/04/20 2133 09/05/20 0803  Weight: 110.7 kg 110.7 kg 109.3 kg    Intake/Output Summary (Last 24 hours) at 09/05/2020 8546 Last data filed at 09/04/2020 2133 Gross per 24 hour  Intake 751.61 ml  Output 300 ml  Net 451.61 ml    Additional Objective Labs: Basic Metabolic Panel: Recent Labs  Lab 09/03/20 0938 09/04/20 0153 09/04/20 0827 09/05/20 0559  NA 132* 133* 132* 129*  K 4.7 5.2* 4.2 4.4  CL 97* 98 95* 95*  CO2 21* 21* 24 23  GLUCOSE 154* 189* 156* 150*  BUN 76* 75* 42* 67*  CREATININE 12.66* 12.99* 8.27* 11.02*  CALCIUM 8.5* 8.5* 8.7* 8.7*  PHOS 9.2* 8.3*  --  7.1*   Liver Function Tests: Recent Labs  Lab 09/03/20 0938 09/04/20 0153 09/05/20 0559  AST 11* 9* 11*  ALT 12 11 12   ALKPHOS 50 47 46  BILITOT 0.7 1.0 0.7  PROT 6.3* 6.3* 5.9*  ALBUMIN 3.0* 2.8* 2.6*   CBC: Recent Labs  Lab 09/02/20 0134 09/02/20 1300 09/03/20 0938 09/04/20 0153 09/05/20 0559  WBC 5.7 5.7 6.8 16.9* 5.7  NEUTROABS  --   --  5.4 16.0* 5.0  HGB 8.6* 8.5* 9.2* 9.2* 8.8*  HCT 25.5* 25.7* 27.1* 27.3* 26.9*  MCV 89.8 90.5 90.9 91.0 91.5  PLT 162 188 230 166 139*   Blood Culture    Component Value Date/Time   SDES BLOOD RIGHT  HAND 09/03/2020 1815   SPECREQUEST  09/03/2020 1815    BOTTLES DRAWN AEROBIC ONLY Blood Culture adequate volume   CULT STAPHYLOCOCCUS AUREUS (A) 09/03/2020 1815   REPTSTATUS PENDING 09/03/2020 1815    CBG: Recent Labs  Lab 09/04/20 0730 09/04/20 1127 09/04/20 1658 09/04/20 2133 09/05/20 0641  GLUCAP 149* 183* 181* 194* 146*   Iron Studies:  Recent Labs    09/04/20 0153  IRON 27*  TIBC 213*  FERRITIN 714*   Lab Results  Component Value Date   INR 1.2 06/16/2020   Studies/Results: DG CHEST PORT 1 VIEW  Result Date: 09/04/2020 CLINICAL DATA:  Altered mental status. EXAM: PORTABLE CHEST 1 VIEW COMPARISON:  Same day. FINDINGS: Stable cardiomediastinal silhouette. No pneumothorax or pleural effusion is noted. Right lung is clear. Minimal residual left lung opacities are noted. Bony thorax is unremarkable. IMPRESSION: Minimal residual left lung opacities are noted. Electronically Signed   By: Marijo Conception M.D.   On: 09/04/2020 14:22   DG Chest Port 1 View  Result Date: 09/04/2020 CLINICAL DATA:  Respiratory failure.  Hypoxia. EXAM: PORTABLE CHEST 1 VIEW COMPARISON:  09/03/2020. FINDINGS: Mediastinum and hilar structures normal. Cardiomegaly and  pulmonary venous congestion again noted. Low lung volumes. Interim partial clearing of interstitial edema/infiltrates. No pleural effusion or pneumothorax. Left costophrenic angle incompletely imaged. IMPRESSION: Cardiomegaly and pulmonary venous congestion again noted. Low lung volumes. Interim partial clearing of interstitial edema/infiltrates. Electronically Signed   By: Marcello Moores  Register   On: 09/04/2020 05:35   DG CHEST PORT 1 VIEW  Result Date: 09/03/2020 CLINICAL DATA:  Shortness of breath EXAM: PORTABLE CHEST 1 VIEW COMPARISON:  Radiograph 08/31/2016 FINDINGS: Diffuse fluffy reticulonodular and patchy opacities are present throughout both lungs with developing pulmonary vascular congestion and redistribution as well as fissural and  septal thickening. Cardiac silhouette is prominent though may be accentuated by low volumes and portable technique. No acute osseous or soft tissue abnormality. Telemetry leads overlie the chest. IMPRESSION: Worsening pulmonary vascular congestion and features of interstitial edema. More hazy reticulonodular opacities, likely reflect developing alveolar edema though underlying or superimposed infection is difficult to exclude in the appropriate clinical setting. Electronically Signed   By: Lovena Le M.D.   On: 09/03/2020 16:24    Medications: . sodium chloride    . sodium chloride     . calcitRIOL  2.25 mcg Oral Q M,W,F-HD  . carvedilol  25 mg Oral BID WC  . Chlorhexidine Gluconate Cloth  6 each Topical Q0600  . darbepoetin (ARANESP) injection - DIALYSIS  60 mcg Intravenous Q Wed-HD  . ferric citrate  420 mg Oral TID WC  . heparin  5,000 Units Subcutaneous Q8H  . insulin aspart  0-5 Units Subcutaneous QHS  . insulin aspart  0-6 Units Subcutaneous TID WC  . irbesartan  150 mg Oral Daily  . mupirocin ointment  1 application Nasal BID  . pantoprazole  40 mg Oral Q1200  . pravastatin  80 mg Oral QPM  . vancomycin variable dose per unstable renal function (pharmacist dosing)   Does not apply See admin instructions    Dialysis Orders: 4h 62min   MWF 400/500  111.5kg  2/2 bath P2 Hep none  - venofer 50 /wk  - mircera 75 q4, last 4/20 due 5/18  - calcitriol 2.25 ug tiw  - home meds: auryxia 2 ac tid  Assessment/ Plan: 1. Acute resp failure - pulm edema, +/- infection, serial HD over the last 5 days. CXR yesterday improved.  2. Bacteremia  - BC +staph aureus, t max 99 x24hrs.  ?source IV site in R hand.  On Vanc.  ID following, TTE ordered. Repeat BC today.  3. Hyponatremia - Na 129, should improve with HD today.  4. AMS - resolved. Multifactorial etiology - uremia, bacteremia, delirium.  Issues with AMS x2 this week while on HD - unsure of the cause.  AAOx3 while on HD so far today.    5. ESRD - HD MWF. Serial HD x 5days.  HD today per regular schedule.  Next HD on 5/23.  6. HTN/vol  - Blood pressure well controlled.  Volume status improving with serial HD, net 7L off so far, plan for 3-3.5L again today.  CXR yesterday improved.  7. Anemia ckd - Hgb 8.8.  Plan for aranesp 60ug weekly qWed.  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  Phos elevated.  Cont vdra, increase Auryxia to 3AC TID. 9. Nutrition - Alb low, renal diet w/fluid restrictions, protein supplements.    Jen Mow, PA-C [facility] 09/05/2020,9:38 AM  LOS: 3 days

## 2020-09-05 NOTE — Progress Notes (Signed)
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  He was able to answer questions in the ED and did not have any focal deficits.  Blood pressure was elevated systolics into the 376E.  Labs revealed severe nephric and amount of uremia and azotemia and a BUN/creatinine of 119/17.5.  His COVID testing was negative and chest x-ray is negative for any acute findings.  ED provider spoke lead nephrologist who recommended admission for dialysis.  Prior to him coming to the ED he had been ill and was having vomiting and diarrhea and only could tolerate 1 hour dialysis Monday.  His vomiting has now resolved and is not having abdominal pain.  Because he was significantly uremic he was admitted for dialysis and during his dialysis he became acutely agitated and could not complete more than 1/2 hours of treatment while hospitalized.  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  Nephrology continues to dialyze the patient gently daily and recommending a 2 and half hour session today and in 2 and half hour session tomorrow possibly.  Patient's mental status was back to his baseline however he unfortunately decompensated in Dialysis yesterday and a Rapid Response was called.  **Patient became hypoxic and was septic yesterday and a stat x-ray showed some pulmonary edema.  He was placed on Precedex drip and cultures were obtained and underwent hemodialysis.  His acute hypoxic respiratory failure is now improved  significantly.  Procalcitonin severely elevated in accordance to his severe sepsis.  Unclear source but given his intermittent hemodialysis via his fistula is concern for bacteremia.  We will follow-up on cultures.  His mentation is improving but still somnolent.  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  There was subtle parenchymal volume loss greater than expected for age -EEG done 09/01/20 and showed that the study was suggestive of moderate diffuse encephalopathy which is nonspecific in etiology and no seizures or epileptiform discharges were seen throughout the recording -Presented with a BUN/Cr of 119/17.52 after missing 2.5 Dialysis Sessions -He has had similar spells in the past with dialysis when he was admitted last month -Neurology was consulted and work-up was initiated and felt his presentation was most consistent with dialysis disequilibrium syndrome and recommended no further imaging and recommended  compliance with hemodialysis to largely avoid fluctuation of fluid shifts that can be caused by hemodialysis -Nephrology is following  appreciate Dialysis recc's -BUN/Cr has improved to 76/12.66 -> 42/8.27 -> 67/11.02 -Nephrology was planning on gentle HD with a run time of 2.5 hours for the next few days to get Azotemia down until he decompensated the day before yesterday and had 4 Liters removed today  -He became extremely encephalopathic yesterday likely was multifactorial related to dialysis and on adherence, delirium and sepsis from Bacteremia  -He was placed on Precedex they day before yesterday for dialysis initiation given his agitation and was stopped by PCCM today   ESRD with HD MWF Metabolic Acidosis Hyperphosphatemia, Hypermagnesemia -Patient normally gets dialysis in [facility] in [place] and has missed at least 2-1/2 dialysis sessions and only completed half hour of the session on last Monday -Patient's BUN/Cr is improving from 119/17.52 -> 76/12.66 and today it is 75/12.99 -> 42/8.27 -> 67/11.02 -Patient had a mild Metabolic Acidosis but this is improved as CO2 is now 23, AG of 11, and Chloride of 95 -Avoid Nephrotoxic Medications, Contrast Dyes, Hypotension and Renally adjust medications -Nephrology following and planning for a 2.5 hour Dialysis Session today and possibly tomorrow until Azotemia is improved and resolved  -Patient's magnesium level is now 2.5 and phosphorus level is now 7.1 -He had 4 Liters removed yesterday AM and repeating CXR yesterday afternoon showed "Stable cardiomediastinal silhouette. No pneumothorax or pleural effusion is noted. Right lung is clear. Minimal residual left lung opacities are noted. Bony thorax is unremarkable."  -Continue with calcitriol 2.25 mcg p.o. every Monday Wednesday Friday with hemodialysis and continue with ferric citrate 420 mg p.o. 3 times daily with meals -Repeat CMP int he AM   Acute Respiratory Failure with Hypoxia with acute pulmonary edema, improving  -In the setting of Pulmonary Edema with possible Concurrent Infection -Had to be put on Precedex to be  Dialyzed -Had to be placed on BiPAP and then NRB -Was off of Supplemental O2 yesterday but on 1 Liter in Dialysis  -SpO2: 94 % O2 Flow Rate (L/min): 1 L/min FiO2 (%): 60 %  -Continuous Pulse Oximetry and Maintain O2 Saturations >90% -Continue to Monitor Respiratory Status Carefully -Repeat CXR tomorrow AM  Severe Sepsis in the setting of MRSA Bacteremia in the setting of ? Infiltrated IV -Patient became Febrile, Tachycardic, Tachypenic with Acute Respiratory Failure with Hypoxia and Altered Mental Status on 09/03/20 -Checked Blood Cx x2 and was + for MRSA, CXR as above, Urinalysis and Urine Cx obtained and Urine Cx pending but U/A was unremarkable -Started Abx on with IV Vancomycin and IV Cefepime but stopped now that MRSA is in blood Cx -Checked PCT and was elevated and went from 1.82 -> 64.33 -> 133.23 -WBC went from 6.8 -> 16.9 -> 5.7 -Continue to Monitor Cx's and Follow Temperature Carefully -Suspected source of Bacteremia could be inflitrated IV in Right Hand -ID Consulted for further evaluation and recommendations and recommending repeating blood cultures this morning as well as obtain a TTE to start working out consideration for endocarditis and continue with MRSA contact precautions. -Follow-up on TTE that is been ordered and suspected source could be right phlebitis from the IV site in his hand  Hyperkalemia, -Improved with dialysis  -Patient's potassium is now improved to 4.4 -Defer to Nephrology to correct but may try some Lokelma  -Continue monitor and trend and repeat CMP in the a.m.  Hyponatremia -Mild. Patient's Na+ is now 132 today and today is  129 -Expect to correct in Dialysis that is being done today -Continue to Monitor and Trend -Repeat CMP in the AM   Hypertensive Urgency with likely flash pulmonary edema -Blood pressure was elevated to systolics in the 932T on admission and then again became elevated on 09/03/2020 he decompensated -Resumed home blood pressure  medications including carvedilol 25 mg p.o. twice daily and with irbesartan 150 mg p.o. daily -Continue monitor blood pressures per protocol -Last blood pressure reading was 124/56 -Continue IV hydralazine 5 mg every 4 as needed for systolic blood pressure greater than 180 -Also improving with dialysis  Diarrhea, improved  -Likely viral gastroenteritis-has not been on any recent antibiotics or has any reported fevers -His nausea vomiting and diarrhea has resolved -His GI pathogen panel and C. difficile was negative -Continue to treat supportively with antidiarrheals if necessary  Diabetes Mellitus Type 2 -Recent hemoglobin A1c was 6.4 -Continue with very sensitive NovoLog sliding scale insulin before meals and at bedtime -Continue monitor blood sugars carefully and per protocol; CBGs ranging from 146-194  Hyperlipidemia -Continue with Pravastatin 80 mg p.o. every evening  Anemia of Chronic Kidney Disease -Stable. Patient's Hgb/Hct is now 9.2/27.3 yesterday and today it is 8.8/26.9 -Nephrology ordering Darbepoetin Alfa 60 mcg weekly wile hospitalized and his first dose was Wednesday, 09/03/2020 -Checked Anemia Panel and showed an iron level of 27, U IBC 186, TIBC of 213, saturation ratio 13%, ferritin level 714, folate of 20.3, and vitamin B12 of 357 -Continue to Monitor for S/Sx of Bleeding; Currently no overt bleeding noted -Repeat CBC in the AM   Obesity -Complicates overall prognosis and care -Estimated body mass index is 31.75 kg/m as calculated from the following:   Height as of this encounter: 6' (1.829 m).   Weight as of this encounter: 106.2 kg. -Weight Loss and Dietary Counseling given   DVT prophylaxis: Heparin 5,000 units sq q8h Code Status: FULL CODE  Family Communication: No family present at bedside as he is seen in the dialysis unit Disposition Plan: Pending further improvement of his infection and clearance by infectious diseases as well as nephrology  Status  is: Inpatient  Remains inpatient appropriate because:Unsafe d/c plan, IV treatments appropriate due to intensity of illness or inability to take PO and Inpatient level of care appropriate due to severity of illness   Dispo: The patient is from: Home              Anticipated d/c is to: Home              Patient currently is not medically stable to d/c.   Difficult to place patient No  Consultants:   Nephrology  Neurology  PCCM  Infectious Diseases    Procedures: Hemodialysis   EEG  Antimicrobials:  Anti-infectives (From admission, onward)   Start     Dose/Rate Route Frequency Ordered Stop   09/04/20 1800  ceFEPIme (MAXIPIME) 1 g in sodium chloride 0.9 % 100 mL IVPB  Status:  Discontinued        1 g 200 mL/hr over 30 Minutes Intravenous Every 24 hours 09/03/20 1704 09/04/20 1412   09/04/20 1115  vancomycin (VANCOREADY) IVPB 1000 mg/200 mL        1,000 mg 200 mL/hr over 60 Minutes Intravenous  Once 09/04/20 1016 09/04/20 1311   09/03/20 1703  vancomycin variable dose per unstable renal function (pharmacist dosing)         Does not apply See admin instructions 09/03/20 1704     09/03/20  1700  vancomycin (VANCOCIN) 2,500 mg in sodium chloride 0.9 % 500 mL IVPB        2,500 mg 250 mL/hr over 120 Minutes Intravenous  Once 09/03/20 1656 09/03/20 2045   09/03/20 1700  ceFEPIme (MAXIPIME) 2 g in sodium chloride 0.9 % 100 mL IVPB        2 g 200 mL/hr over 30 Minutes Intravenous  Once 09/03/20 1656 09/03/20 1900        Subjective: Seen and examined at the bedside in the dialysis center and he is much more awake and alert and in no acute distress.  Thinks he is doing better.  Understands that he has a bacteria in his bloodstream.  Was wearing 1 L supplemental oxygen via nasal cannula.  No chest pain or shortness of breath.  States his right hand is still a little sore.  No other concerns or complaints at this time  Objective: Vitals:   09/05/20 1100 09/05/20 1130 09/05/20 1137  09/05/20 1214  BP: 132/69 135/65 (!) 142/77 (!) 124/56  Pulse: 87  87 91  Resp:   18 18  Temp:   97.6 F (36.4 C) 97.9 F (36.6 C)  TempSrc:   Oral Oral  SpO2:   96% 94%  Weight:   106.2 kg   Height:        Intake/Output Summary (Last 24 hours) at 09/05/2020 1225 Last data filed at 09/05/2020 1137 Gross per 24 hour  Intake 440.07 ml  Output 3300 ml  Net -2859.93 ml   Filed Weights   09/04/20 2133 09/05/20 0803 09/05/20 1137  Weight: 110.7 kg 109.3 kg 106.2 kg   Examination: Physical Exam:  Constitutional: WN/WD obese Caucasian male currently in no acute distress and is much more awake and alert today Eyes: Lids and conjunctivae normal, sclerae anicteric  ENMT: External Ears, Nose appear normal. Grossly normal hearing.  Neck: Appears normal, supple, no cervical masses, normal ROM, no appreciable thyromegaly; no JVD Respiratory: Diminished to auscultation bilaterally with coarse breath sounds, no wheezing, rales, rhonchi or crackles. Normal respiratory effort and patient is not tachypenic. No accessory muscle use.  Unlabored breathing but is wearing supplemental oxygen nasal cannula Cardiovascular: RRR, no murmurs / rubs / gallops. S1 and S2 auscultated.  Has mild lower extremity edema Abdomen: Soft, non-tender, distended secondary to body habitus.  Bowel sounds positive.  GU: Deferred. Musculoskeletal: No clubbing / cyanosis of digits/nails. No joint deformity upper and lower extremities.  Has a left AV fistula in his forearm and is getting dialysis from  Skin: Has lower extremity wounds that do not appear to be infected and chronic venous stasis changes. No induration; right hand is a little erythematous and streaky Neurologic: CN 2-12 grossly intact with no focal deficits. Romberg sign and cerebellar reflexes not assessed.  Psychiatric: Normal judgment and insight. Alert and oriented x 3. Normal mood and appropriate affect.   Data Reviewed: I have personally reviewed following  labs and imaging studies  CBC: Recent Labs  Lab 08/31/20 2112 09/01/20 1758 09/02/20 0134 09/02/20 1300 09/03/20 0938 09/04/20 0153 09/05/20 0559  WBC 9.3   < > 5.7 5.7 6.8 16.9* 5.7  NEUTROABS 7.3  --   --   --  5.4 16.0* 5.0  HGB 10.1*   < > 8.6* 8.5* 9.2* 9.2* 8.8*  HCT 29.9*   < > 25.5* 25.7* 27.1* 27.3* 26.9*  MCV 90.3   < > 89.8 90.5 90.9 91.0 91.5  PLT 199   < > 162  188 230 166 139*   < > = values in this interval not displayed.   Basic Metabolic Panel: Recent Labs  Lab 09/01/20 1758 09/02/20 0134 09/03/20 0938 09/04/20 0153 09/04/20 0827 09/05/20 0559  NA 134*  133* 133* 132* 133* 132* 129*  K 4.6  4.5 4.7 4.7 5.2* 4.2 4.4  CL 96*  96* 98 97* 98 95* 95*  CO2 22  22 21* 21* 21* 24 23  GLUCOSE 157*  157* 105* 154* 189* 156* 150*  BUN 89*  90* 96* 76* 75* 42* 67*  CREATININE 14.17*  14.27* 14.46* 12.66* 12.99* 8.27* 11.02*  CALCIUM 8.5*  8.5* 8.3* 8.5* 8.5* 8.7* 8.7*  MG  --   --  2.3 2.0  --  2.5*  PHOS 9.1*  --  9.2* 8.3*  --  7.1*   GFR: Estimated Creatinine Clearance: 9.2 mL/min (A) (by C-G formula based on SCr of 11.02 mg/dL (H)). Liver Function Tests: Recent Labs  Lab 08/31/20 2112 09/01/20 1758 09/03/20 0938 09/04/20 0153 09/05/20 0559  AST 13*  --  11* 9* 11*  ALT 13  --  12 11 12   ALKPHOS 66  --  50 47 46  BILITOT 0.9  --  0.7 1.0 0.7  PROT 7.0  --  6.3* 6.3* 5.9*  ALBUMIN 3.4* 2.9* 3.0* 2.8* 2.6*   No results for input(s): LIPASE, AMYLASE in the last 168 hours. Recent Labs  Lab 08/31/20 2341  AMMONIA 16   Coagulation Profile: No results for input(s): INR, PROTIME in the last 168 hours. Cardiac Enzymes: No results for input(s): CKTOTAL, CKMB, CKMBINDEX, TROPONINI in the last 168 hours. BNP (last 3 results) No results for input(s): PROBNP in the last 8760 hours. HbA1C: No results for input(s): HGBA1C in the last 72 hours. CBG: Recent Labs  Lab 09/04/20 1127 09/04/20 1658 09/04/20 2133 09/05/20 0641 09/05/20 1212   GLUCAP 183* 181* 194* 146* 190*   Lipid Profile: No results for input(s): CHOL, HDL, LDLCALC, TRIG, CHOLHDL, LDLDIRECT in the last 72 hours. Thyroid Function Tests: No results for input(s): TSH, T4TOTAL, FREET4, T3FREE, THYROIDAB in the last 72 hours. Anemia Panel: Recent Labs    09/04/20 0153  VITAMINB12 357  FOLATE 20.3  FERRITIN 714*  TIBC 213*  IRON 27*  RETICCTPCT 0.9   Sepsis Labs: Recent Labs  Lab 09/03/20 1816 09/04/20 0153 09/05/20 0559  PROCALCITON 1.82 64.33 133.23    Recent Results (from the past 240 hour(s))  Resp Panel by RT-PCR (Flu A&B, Covid) Nasopharyngeal Swab     Status: None   Collection Time: 08/31/20 11:43 PM   Specimen: Nasopharyngeal Swab; Nasopharyngeal(NP) swabs in vial transport medium  Result Value Ref Range Status   SARS Coronavirus 2 by RT PCR NEGATIVE NEGATIVE Final    Comment: (NOTE) SARS-CoV-2 target nucleic acids are NOT DETECTED.  The SARS-CoV-2 RNA is generally detectable in upper respiratory specimens during the acute phase of infection. The lowest concentration of SARS-CoV-2 viral copies this assay can detect is 138 copies/mL. A negative result does not preclude SARS-Cov-2 infection and should not be used as the sole basis for treatment or other patient management decisions. A negative result may occur with  improper specimen collection/handling, submission of specimen other than nasopharyngeal swab, presence of viral mutation(s) within the areas targeted by this assay, and inadequate number of viral copies(<138 copies/mL). A negative result must be combined with clinical observations, patient history, and epidemiological information. The expected result is Negative.  Fact Sheet for Patients:  EntrepreneurPulse.com.au  Fact Sheet for Healthcare Providers:  IncredibleEmployment.be  This test is no t yet approved or cleared by the [place] FDA and  has been authorized for detection  and/or diagnosis of SARS-CoV-2 by FDA under an Emergency Use Authorization (EUA). This EUA will remain  in effect (meaning this test can be used) for the duration of the COVID-19 declaration under Section 564(b)(1) of the Act, 21 U.S.C.section 360bbb-3(b)(1), unless the authorization is terminated  or revoked sooner.       Influenza A by PCR NEGATIVE NEGATIVE Final   Influenza B by PCR NEGATIVE NEGATIVE Final    Comment: (NOTE) The Xpert Xpress SARS-CoV-2/FLU/RSV plus assay is intended as an aid in the diagnosis of influenza from Nasopharyngeal swab specimens and should not be used as a sole basis for treatment. Nasal washings and aspirates are unacceptable for Xpert Xpress SARS-CoV-2/FLU/RSV testing.  Fact Sheet for Patients: EntrepreneurPulse.com.au  Fact Sheet for Healthcare Providers: IncredibleEmployment.be  This test is not yet approved or cleared by the [place] FDA and has been authorized for detection and/or diagnosis of SARS-CoV-2 by FDA under an Emergency Use Authorization (EUA). This EUA will remain in effect (meaning this test can be used) for the duration of the COVID-19 declaration under Section 564(b)(1) of the Act, 21 U.S.C. section 360bbb-3(b)(1), unless the authorization is terminated or revoked.  Performed at [facility], [place] [address] [place], [place] [address]   Gastrointestinal Panel by PCR , Stool     Status: None   Collection Time: 09/01/20  4:05 AM   Specimen: Stool  Result Value Ref Range Status   Campylobacter species NOT DETECTED NOT DETECTED Final   Plesimonas shigelloides NOT DETECTED NOT DETECTED Final   Salmonella species NOT DETECTED NOT DETECTED Final   Yersinia enterocolitica NOT DETECTED NOT DETECTED Final   Vibrio species NOT DETECTED NOT DETECTED Final   Vibrio cholerae NOT DETECTED NOT DETECTED Final   Enteroaggregative E coli (EAEC) NOT DETECTED NOT DETECTED Final   Enteropathogenic  E coli (EPEC) NOT DETECTED NOT DETECTED Final   Enterotoxigenic E coli (ETEC) NOT DETECTED NOT DETECTED Final   Shiga like toxin producing E coli (STEC) NOT DETECTED NOT DETECTED Final   Shigella/Enteroinvasive E coli (EIEC) NOT DETECTED NOT DETECTED Final   Cryptosporidium NOT DETECTED NOT DETECTED Final   Cyclospora cayetanensis NOT DETECTED NOT DETECTED Final   Entamoeba histolytica NOT DETECTED NOT DETECTED Final   Giardia lamblia NOT DETECTED NOT DETECTED Final   Adenovirus F40/41 NOT DETECTED NOT DETECTED Final   Astrovirus NOT DETECTED NOT DETECTED Final   Norovirus GI/GII NOT DETECTED NOT DETECTED Final   Rotavirus A NOT DETECTED NOT DETECTED Final   Sapovirus (I, II, IV, and V) NOT DETECTED NOT DETECTED Final    Comment: Performed at [facility], [address] [place], [place] [address]  C Difficile Quick Screen w PCR reflex     Status: None   Collection Time: 09/01/20 12:03 PM   Specimen: STOOL  Result Value Ref Range Status   C Diff antigen NEGATIVE NEGATIVE Final   C Diff toxin NEGATIVE NEGATIVE Final   C Diff interpretation No C. difficile detected.  Final    Comment: Performed at [facility], [place] [address] [place], [place] [address]  MRSA PCR Screening     Status: Abnormal   Collection Time: 09/01/20 12:15 PM   Specimen: Nasopharyngeal  Result Value Ref Range Status   MRSA by PCR POSITIVE (A) NEGATIVE  Final    Comment:        The GeneXpert MRSA Assay (FDA approved for NASAL specimens only), is one component of a comprehensive MRSA colonization surveillance program. It is not intended to diagnose MRSA infection nor to guide or monitor treatment for MRSA infections. RESULT CALLED TO, READ BACK BY AND VERIFIED WITH: A CREWS RN [id number] 09/01/20 A BROWNING Performed at [facility], [place] [address], [place], [place] [address]   Culture, blood (routine x 2)     Status: Abnormal (Preliminary result)   Collection Time: 09/03/20  6:13 PM    Specimen: BLOOD RIGHT HAND  Result Value Ref Range Status   Specimen Description BLOOD RIGHT HAND  Final   Special Requests   Final    BOTTLES DRAWN AEROBIC ONLY Blood Culture adequate volume   Culture  Setup Time   Final    GRAM POSITIVE COCCI IN CLUSTERS AEROBIC BOTTLE ONLY CRITICAL RESULT CALLED TO, READ BACK BY AND VERIFIED WITH: PHARMD MADISON YATES AT 3976 ON 09/04/20 BY KJ    Culture (A)  Final    STAPHYLOCOCCUS AUREUS SUSCEPTIBILITIES TO FOLLOW Performed at [facility], [place] [address] [place], [place] [address]    Report Status PENDING  Incomplete  Blood Culture ID Panel (Reflexed)     Status: Abnormal   Collection Time: 09/03/20  6:13 PM  Result Value Ref Range Status   Enterococcus faecalis NOT DETECTED NOT DETECTED Final   Enterococcus Faecium NOT DETECTED NOT DETECTED Final   Listeria monocytogenes NOT DETECTED NOT DETECTED Final   Staphylococcus species DETECTED (A) NOT DETECTED Final    Comment: CRITICAL RESULT CALLED TO, READ BACK BY AND VERIFIED WITH: PHARMD MADSION YATES AT 1325 ON 09/04/20 BY KJ    Staphylococcus aureus (BCID) DETECTED (A) NOT DETECTED Final    Comment: Methicillin (oxacillin)-resistant Staphylococcus aureus (MRSA). MRSA is predictably resistant to beta-lactam antibiotics (except ceftaroline). Preferred therapy is vancomycin unless clinically contraindicated. Patient requires contact precautions if  hospitalized. CRITICAL RESULT CALLED TO, READ BACK BY AND VERIFIED WITH: PHARMD MADISON YATES AT 3790 ON 09/04/20 BY KJ    Staphylococcus epidermidis NOT DETECTED NOT DETECTED Final   Staphylococcus lugdunensis NOT DETECTED NOT DETECTED Final   Streptococcus species NOT DETECTED NOT DETECTED Final   Streptococcus agalactiae NOT DETECTED NOT DETECTED Final   Streptococcus pneumoniae NOT DETECTED NOT DETECTED Final   Streptococcus pyogenes NOT DETECTED NOT DETECTED Final   A.calcoaceticus-baumannii NOT DETECTED NOT DETECTED Final   Bacteroides  fragilis NOT DETECTED NOT DETECTED Final   Enterobacterales NOT DETECTED NOT DETECTED Final   Enterobacter cloacae complex NOT DETECTED NOT DETECTED Final   Escherichia coli NOT DETECTED NOT DETECTED Final   Klebsiella aerogenes NOT DETECTED NOT DETECTED Final   Klebsiella oxytoca NOT DETECTED NOT DETECTED Final   Klebsiella pneumoniae NOT DETECTED NOT DETECTED Final   Proteus species NOT DETECTED NOT DETECTED Final   Salmonella species NOT DETECTED NOT DETECTED Final   Serratia marcescens NOT DETECTED NOT DETECTED Final   Haemophilus influenzae NOT DETECTED NOT DETECTED Final   Neisseria meningitidis NOT DETECTED NOT DETECTED Final   Pseudomonas aeruginosa NOT DETECTED NOT DETECTED Final   Stenotrophomonas maltophilia NOT DETECTED NOT DETECTED Final   Candida albicans NOT DETECTED NOT DETECTED Final   Candida auris NOT DETECTED NOT DETECTED Final   Candida glabrata NOT DETECTED NOT DETECTED Final   Candida krusei NOT DETECTED NOT DETECTED Final   Candida parapsilosis NOT DETECTED NOT DETECTED Final  Candida tropicalis NOT DETECTED NOT DETECTED Final   Cryptococcus neoformans/gattii NOT DETECTED NOT DETECTED Final   Meth resistant mecA/C and MREJ DETECTED (A) NOT DETECTED Final    Comment: CRITICAL RESULT CALLED TO, READ BACK BY AND VERIFIED WITH: PHARMD MADISON YATES AT [place] ON 09/03/20 Performed at [facility], [place] [address], [place], [place] [address]   Culture, blood (routine x 2)     Status: Abnormal (Preliminary result)   Collection Time: 09/03/20  6:15 PM   Specimen: BLOOD RIGHT HAND  Result Value Ref Range Status   Specimen Description BLOOD RIGHT HAND  Final   Special Requests   Final    BOTTLES DRAWN AEROBIC ONLY Blood Culture adequate volume   Culture  Setup Time   Final    GRAM POSITIVE COCCI AEROBIC BOTTLE ONLY Performed at [facility], [place] [address] [place], [place] [address]    Culture STAPHYLOCOCCUS AUREUS (A)  Final   Report Status  PENDING  Incomplete     RN Pressure Injury Documentation:     Estimated body mass index is 31.75 kg/m as calculated from the following:   Height as of this encounter: 6' (1.829 m).   Weight as of this encounter: 106.2 kg.  Malnutrition Type:   Malnutrition Characteristics:   Nutrition Interventions:    Radiology Studies: DG CHEST PORT 1 VIEW  Result Date: 09/04/2020 CLINICAL DATA:  Altered mental status. EXAM: PORTABLE CHEST 1 VIEW COMPARISON:  Same day. FINDINGS: Stable cardiomediastinal silhouette. No pneumothorax or pleural effusion is noted. Right lung is clear. Minimal residual left lung opacities are noted. Bony thorax is unremarkable. IMPRESSION: Minimal residual left lung opacities are noted. Electronically Signed   By: Marijo Conception M.D.   On: 09/04/2020 14:22   DG Chest Port 1 View  Result Date: 09/04/2020 CLINICAL DATA:  Respiratory failure.  Hypoxia. EXAM: PORTABLE CHEST 1 VIEW COMPARISON:  09/03/2020. FINDINGS: Mediastinum and hilar structures normal. Cardiomegaly and pulmonary venous congestion again noted. Low lung volumes. Interim partial clearing of interstitial edema/infiltrates. No pleural effusion or pneumothorax. Left costophrenic angle incompletely imaged. IMPRESSION: Cardiomegaly and pulmonary venous congestion again noted. Low lung volumes. Interim partial clearing of interstitial edema/infiltrates. Electronically Signed   By: Marcello Moores  Register   On: 09/04/2020 05:35   DG CHEST PORT 1 VIEW  Result Date: 09/03/2020 CLINICAL DATA:  Shortness of breath EXAM: PORTABLE CHEST 1 VIEW COMPARISON:  Radiograph 08/31/2016 FINDINGS: Diffuse fluffy reticulonodular and patchy opacities are present throughout both lungs with developing pulmonary vascular congestion and redistribution as well as fissural and septal thickening. Cardiac silhouette is prominent though may be accentuated by low volumes and portable technique. No acute osseous or soft tissue abnormality. Telemetry  leads overlie the chest. IMPRESSION: Worsening pulmonary vascular congestion and features of interstitial edema. More hazy reticulonodular opacities, likely reflect developing alveolar edema though underlying or superimposed infection is difficult to exclude in the appropriate clinical setting. Electronically Signed   By: Lovena Le M.D.   On: 09/03/2020 16:24   Scheduled Meds: . calcitRIOL  2.25 mcg Oral Q M,W,F-HD  . carvedilol  25 mg Oral BID WC  . Chlorhexidine Gluconate Cloth  6 each Topical Q0600  . darbepoetin (ARANESP) injection - DIALYSIS  60 mcg Intravenous Q Wed-HD  . ferric citrate  630 mg Oral TID WC  . heparin  5,000 Units Subcutaneous Q8H  . insulin aspart  0-5 Units Subcutaneous QHS  . insulin aspart  0-6 Units Subcutaneous TID  WC  . irbesartan  150 mg Oral Daily  . mupirocin ointment  1 application Nasal BID  . pantoprazole  40 mg Oral Q1200  . pravastatin  80 mg Oral QPM  . vancomycin variable dose per unstable renal function (pharmacist dosing)   Does not apply See admin instructions   Continuous Infusions:   LOS: 3 days   Kerney Elbe, DO [facility] PAGER is on [place]  If 7PM-7AM, please contact night-coverage [email or web address]

## 2020-09-05 NOTE — Progress Notes (Signed)
Pharmacy Antibiotic Note  Scott Castillo is a 59 y.o. male admitted on 08/31/2020 with altered mental status after missing 2 dialysis sessions. Antibiotics being initiated for potential pneumonia.  Pharmacy has been consulted for Vancomycin and Cefepime dosing.  CXR showing worsening pulmonary vascular congestion.  Patient with ESRD on hemodialysis Mondays, Wednesdays, and Fridays. WBC is within normal limits. Blood and urine cultures being sent.  MRSA PCR positive. Cdiff negative.   Patient is currently in hemodialysis today - rapid response was called for hypoxia, shortness of breath and has new fever with Tmax 103. WBC is within normal limits. Blood and urine cultures being sent.  MRSA PCR positive. Cdiff negative.   Pt is now on D3 abx. Blood cultures are now positive for MRSA bacteremia with suspicion for source as IV site. Plan for TTE. Pt has gotten HD x5 with plan to resume MWF.   Plan:  Vanc 1g IV x1 today then start 1g MWF Level as needed  Height: 6' (182.9 cm) Weight: 106.2 kg (234 lb 2.1 oz) IBW/kg (Calculated) : 77.6  Temp (24hrs), Avg:98.4 F (36.9 C), Min:97.6 F (36.4 C), Max:99 F (37.2 C)  Recent Labs  Lab 09/02/20 0134 09/02/20 1300 09/03/20 0938 09/04/20 0153 09/04/20 0827 09/05/20 0559  WBC 5.7 5.7 6.8 16.9*  --  5.7  CREATININE 14.46*  --  12.66* 12.99* 8.27* 11.02*    Estimated Creatinine Clearance: 9.2 mL/min (A) (by C-G formula based on SCr of 11.02 mg/dL (H)).    Allergies  Allergen Reactions  . Pioglitazone Swelling    Site of swelling not recalled by patient  . Adhesive [Tape] Other (See Comments)    Causes redness, pt prefers paper tape   . Latex Rash    Redness, also    Antimicrobials this admission: Vancomycin 5/18 >> Cefepime 5/18 x1  Dose adjustments this admission:   Microbiology results: 5/16 MRSA PCR - positive 5/16 C.diff - negative 5/16 GI panel PCR - negative 5/18 UCx - sent 5/18 BCx - MRSA  5/20 blood>>  Onnie Boer, PharmD, [place], AAHIVP, CPP Infectious Disease Pharmacist 09/05/2020 12:32 PM

## 2020-09-05 NOTE — Progress Notes (Signed)
  Echocardiogram 2D Echocardiogram has been performed.  Scott Castillo 09/05/2020, 5:24 PM

## 2020-09-05 NOTE — Progress Notes (Signed)
Per patient is allergic to heparin  Notified provider

## 2020-09-06 DIAGNOSIS — R7881 Bacteremia: Secondary | ICD-10-CM | POA: Diagnosis not present

## 2020-09-06 DIAGNOSIS — E1169 Type 2 diabetes mellitus with other specified complication: Secondary | ICD-10-CM | POA: Diagnosis not present

## 2020-09-06 DIAGNOSIS — J9601 Acute respiratory failure with hypoxia: Secondary | ICD-10-CM | POA: Diagnosis not present

## 2020-09-06 DIAGNOSIS — I809 Phlebitis and thrombophlebitis of unspecified site: Secondary | ICD-10-CM

## 2020-09-06 DIAGNOSIS — N186 End stage renal disease: Secondary | ICD-10-CM | POA: Diagnosis not present

## 2020-09-06 LAB — COMPREHENSIVE METABOLIC PANEL
ALT: 14 U/L (ref 0–44)
AST: 14 U/L — ABNORMAL LOW (ref 15–41)
Albumin: 2.5 g/dL — ABNORMAL LOW (ref 3.5–5.0)
Alkaline Phosphatase: 48 U/L (ref 38–126)
Anion gap: 12 (ref 5–15)
BUN: 45 mg/dL — ABNORMAL HIGH (ref 6–20)
CO2: 25 mmol/L (ref 22–32)
Calcium: 8.7 mg/dL — ABNORMAL LOW (ref 8.9–10.3)
Chloride: 94 mmol/L — ABNORMAL LOW (ref 98–111)
Creatinine, Ser: 8.18 mg/dL — ABNORMAL HIGH (ref 0.61–1.24)
GFR, Estimated: 7 mL/min — ABNORMAL LOW (ref >60)
Glucose, Bld: 144 mg/dL — ABNORMAL HIGH (ref 70–99)
Potassium: 3.9 mmol/L (ref 3.5–5.1)
Sodium: 131 mmol/L — ABNORMAL LOW (ref 135–145)
Total Bilirubin: 0.9 mg/dL (ref 0.3–1.2)
Total Protein: 5.8 g/dL — ABNORMAL LOW (ref 6.5–8.1)

## 2020-09-06 LAB — PHOSPHORUS: Phosphorus: 5.6 mg/dL — ABNORMAL HIGH (ref 2.5–4.6)

## 2020-09-06 LAB — GLUCOSE, CAPILLARY
Glucose-Capillary: 138 mg/dL — ABNORMAL HIGH (ref 70–99)
Glucose-Capillary: 136 mg/dL — ABNORMAL HIGH (ref 70–99)
Glucose-Capillary: 168 mg/dL — ABNORMAL HIGH (ref 70–99)
Glucose-Capillary: 210 mg/dL — ABNORMAL HIGH (ref 70–99)

## 2020-09-06 LAB — CBC WITH DIFFERENTIAL/PLATELET
Abs Immature Granulocytes: 0.03 10*3/uL (ref 0.00–0.07)
Basophils Absolute: 0 10*3/uL (ref 0.0–0.1)
Basophils Relative: 0 %
Eosinophils Absolute: 0.1 10*3/uL (ref 0.0–0.5)
Eosinophils Relative: 3 %
HCT: 25.7 % — ABNORMAL LOW (ref 39.0–52.0)
Hemoglobin: 8.5 g/dL — ABNORMAL LOW (ref 13.0–17.0)
Immature Granulocytes: 1 %
Lymphocytes Relative: 12 %
Lymphs Abs: 0.6 10*3/uL — ABNORMAL LOW (ref 0.7–4.0)
MCH: 30.5 pg (ref 26.0–34.0)
MCHC: 33.1 g/dL (ref 30.0–36.0)
MCV: 92.1 fL (ref 80.0–100.0)
Monocytes Absolute: 0.8 10*3/uL (ref 0.1–1.0)
Monocytes Relative: 16 %
Neutro Abs: 3.4 10*3/uL (ref 1.7–7.7)
Neutrophils Relative %: 68 %
Platelets: 138 10*3/uL — ABNORMAL LOW (ref 150–400)
RBC: 2.79 MIL/uL — ABNORMAL LOW (ref 4.22–5.81)
RDW: 12.6 % (ref 11.5–15.5)
WBC: 4.9 10*3/uL (ref 4.0–10.5)
nRBC: 0 % (ref 0.0–0.2)

## 2020-09-06 LAB — CULTURE, BLOOD (ROUTINE X 2)
Special Requests: ADEQUATE
Special Requests: ADEQUATE

## 2020-09-06 LAB — URINE CULTURE: Culture: <10000 — AB

## 2020-09-06 LAB — MAGNESIUM: Magnesium: 2.2 mg/dL (ref 1.7–2.4)

## 2020-09-06 NOTE — Plan of Care (Signed)
  Problem: Activity: Goal: Risk for activity intolerance will decrease Outcome: Progressing   

## 2020-09-06 NOTE — Progress Notes (Signed)
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  He was able to answer questions in the ED and did not have any focal deficits.  Blood pressure was elevated systolics into the 557D.  Labs revealed severe nephric and amount of uremia and azotemia and a BUN/creatinine of 119/17.5.  His COVID testing was negative and chest x-ray is negative for any acute findings.  ED provider spoke lead nephrologist who recommended admission for dialysis.  Prior to him coming to the ED he had been ill and was having vomiting and diarrhea and only could tolerate 1 hour dialysis Monday.  His vomiting has now resolved and is not having abdominal pain.  Because he was significantly uremic he was admitted for dialysis and during his dialysis he became acutely agitated and could not complete more than 1/2 hours of treatment while hospitalized.  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  Nephrology continues to dialyze the patient gently daily and recommending a 2 and half hour session today and in 2 and half hour session tomorrow possibly.  Patient's mental status was back to his baseline however he unfortunately decompensated in Dialysis yesterday and a Rapid Response was called.  **Patient became hypoxic and was septic 5/18 and a stat x-ray showed some pulmonary edema.  He was placed on Precedex drip and cultures were obtained showing MRSA Bacteremia and underwent hemodialysis.  His acute hypoxic respiratory failure is  now improved significantly.  Procalcitonin severely elevated in accordance to his severe sepsis with the MRSA Bacteremia.  He was transferred back to the medical floor on 09/05/20 and is improving Mentation wise. ID consulted and recommending repeat Blood Cx and TTE. They may forego the TEE.   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  There was subtle parenchymal volume loss greater than expected for age -EEG done 09/01/20 and showed that the study was suggestive of moderate diffuse encephalopathy which is nonspecific in etiology and no seizures or epileptiform discharges were seen throughout the recording -Presented with a BUN/Cr of 119/17.52 after missing 2.5 Dialysis Sessions -He has had similar spells in the past with dialysis when he was admitted last month -Neurology was consulted and work-up was initiated and felt  his presentation was most consistent with dialysis disequilibrium syndrome and recommended no further imaging and recommended compliance with hemodialysis to largely avoid fluctuation of fluid shifts that  can be caused by hemodialysis -Nephrology is following appreciate Dialysis recc's -BUN/Cr has improved to 76/12.66 -> 42/8.27 -> 67/11.02 -> 45/8.18 -Nephrology was planning on gentle HD with a run time of 2.5 hours for the next few days to get Azotemia down until he decompensated the day before yesterday and had 4 Liters removed today  -He became extremely encephalopathic on 09/03/2020 likely was multifactorial related to dialysis and on adherence, delirium and sepsis from Bacteremia  -His mentation is much improved and is back to his baseline.  He had to be placed on a Precedex drip to be dialyzed on 09/03/2020 and was watched in the ICU on 09/04/2020 and transferred back to [facility] on 09/05/2020  ESRD with HD MWF Metabolic Acidosis Hyperphosphatemia, Hypermagnesemia -Patient normally gets dialysis in [facility] in [place] and has missed at least 2.5 dialysis sessions PTA -Patient's BUN/Cr is now 45/8.18 -Patient had a mild Metabolic Acidosis but this is improved as CO2 is now 25, AG of 12, and Chloride of 94 -Avoid Nephrotoxic Medications, Contrast Dyes, Hypotension and Renally adjust medications -Nephrology following and planning for a 2.5 hour Dialysis Session today and possibly tomorrow until Azotemia is improved and resolved  -Patient's magnesium level is now 2.2 and phosphorus level is now 5.6 -He Dialysis yesterday netting UF 3 Liters  -Repeat CXR 5/19 afternoon showed "Stable cardiomediastinal silhouette. No pneumothorax or pleural effusion is noted. Right lung is clear. Minimal residual left lung opacities are noted. Bony thorax is unremarkable."  -Continue with Calcitriol 2.25 mcg p.o. every Monday Wednesday Friday with hemodialysis and continue with ferric citrate 420 mg p.o. 3 times daily with meals -Repeat CMP int he AM   Acute Respiratory Failure with Hypoxia with acute pulmonary edema, improved -In the setting of Pulmonary Edema with possible Concurrent Infection -Had to be put on  Precedex to be Dialyzed on 5/18 but now off -Had to be placed on BiPAP and then NRB and now not wearing Supplemental O2 today  -SpO2: 97 % O2 Flow Rate (L/min): 1 L/min FiO2 (%): 60 %  -Continuous Pulse Oximetry and Maintain O2 Saturations >90% -Continue to Monitor Respiratory Status Carefully -Repeat CXR tomorrow AM  Severe Sepsis in the setting of MRSA Bacteremia in the setting of ? Infiltrated IV -Patient became Febrile, Tachycardic, Tachypenic with Acute Respiratory Failure with Hypoxia and Altered Mental Status on 09/03/20 -Checked Blood Cx x2 and was + for MRSA, CXR as above, Urinalysis and Urine Cx obtained and Urine Cx pending but U/A was unremarkable -Started Abx on with IV Vancomycin and IV Cefepime but stopped now that MRSA is in blood Cx -Checked PCT and was elevated and went from 1.82 -> 64.33 -> 133.23 -WBC went from 6.8 -> 16.9 -> 5.7 -> 4.9 -Continue to Monitor Cx's and Follow Temperature Carefully -Suspected source of Bacteremia could be inflitrated IV in Right Hand -ID Consulted for further evaluation and recommendations and recommending repeating blood cultures this morning as well as obtain a TTE to start working out consideration for endocarditis and continue with MRSA contact precautions. -TTE done and showed "No evidence of valvular vegetations on this transthoracic echocardiogram. Would recommend a transesophageal echocardiogram to exclude infective endocarditis if clinically indicated."   -Repeat Blood Cx Pending  -Per infectious diseases if TTE looks okay and his repeat blood cultures remain with no growth they will  likely forego his TEE as endocarditis would seem unlikely and) the case I would anticipate 4 weeks of vancomycin and hemodialysis  Hyperkalemia, improved  -Improved with dialysis  -Patient's potassium is now improved to 3.9 -Defer to Nephrology to correct but may try some Lokelma  -Continue monitor and trend and repeat CMP in the  a.m.  Hyponatremia -Mild. Patient's Na+ is now 131 -Expect to correct in Dialysis that is being done today -Continue to Monitor and Trend -Repeat CMP in the AM   Hypertensive Urgency with likely flash pulmonary edema, improved  -Blood pressure was elevated to systolics in the 948N on admission and then again became elevated on 09/03/2020 he decompensated -Resumed home blood pressure medications including carvedilol 25 mg p.o. twice daily and with irbesartan 150 mg p.o. daily -Continue monitor blood pressures per protocol -Last blood pressure reading was 136/78 -Continue IV hydralazine 5 mg every 4 as needed for systolic blood pressure greater than 180 -Also improving with dialysis  Diarrhea, improved  -Likely viral gastroenteritis-has not been on any recent antibiotics or has any reported fevers -His nausea vomiting and diarrhea has resolved -His GI pathogen panel and C. difficile was negative -Continue to treat supportively with antidiarrheals if necessary  Diabetes Mellitus Type 2 -Recent hemoglobin A1c was 6.4 -Continue with very sensitive NovoLog sliding scale insulin before meals and at bedtime -Continue monitor blood sugars carefully and per protocol; CBGs ranging from 146-194  Hyperlipidemia -Continue with Pravastatin 80 mg p.o. every evening  Anemia of Chronic Kidney Disease -Stable. Patient's Hgb/Hct is now gone from 9.2/27.3 -> 8.8/26.9 -> 8.5/25.7 -Nephrology ordering Darbepoetin Alfa 60 mcg weekly wile hospitalized and his first dose was Wednesday, 09/03/2020 -Checked Anemia Panel and showed an iron level of 27, U IBC 186, TIBC of 213, saturation ratio 13%, ferritin level 714, folate of 20.3, and vitamin B12 of 357 -Continue to Monitor for S/Sx of Bleeding; Currently no overt bleeding noted -Repeat CBC in the AM   Thrombocytopenia -Patient's Platelet Count went from 166 -> 139 -> 138 -In the setting of Infection and Bacteremia -Continue to Monitor for S/Sx of  Bleeding; Currently no overt bleeding noted -Repeat CBC in the AM   Obesity -Complicates overall prognosis and care -Estimated body mass index is 31.75 kg/m as calculated from the following:   Height as of this encounter: 6' (1.829 m).   Weight as of this encounter: 106.2 kg. -Weight Loss and Dietary Counseling given   DVT prophylaxis: Heparin 5,000 units sq q8h Code Status: FULL CODE  Family Communication: No family present at bedside as he is seen in the dialysis unit Disposition Plan: Pending further improvement of his infection and clearance by infectious diseases as well as nephrology  Status is: Inpatient  Remains inpatient appropriate because:Unsafe d/c plan, IV treatments appropriate due to intensity of illness or inability to take PO and Inpatient level of care appropriate due to severity of illness   Dispo: The patient is from: Home              Anticipated d/c is to: Home              Patient currently is not medically stable to d/c.   Difficult to place patient No  Consultants:   Nephrology  Neurology  PCCM  Infectious Diseases    Procedures: Hemodialysis   EEG  ECHOCARDIOGRAM IMPRESSIONS    1. Left ventricular ejection fraction, by estimation, is 45 to 50%. The  left ventricle has mildly decreased function. The  left ventricle  demonstrates global hypokinesis. There is mild left ventricular  hypertrophy. Left ventricular diastolic parameters  are consistent with Grade I diastolic dysfunction (impaired relaxation).  2. Right ventricular systolic function is normal. The right ventricular  size is normal. Tricuspid regurgitation signal is inadequate for assessing  PA pressure.  3. Left atrial size was severely dilated.  4. The mitral valve is abnormal. There is mild thickening of the mitral  valve leaflet(s). There is mild calcification of the mitral valve  leaflet(s). Trivial mitral valve regurgitation.  5. The aortic valve was not well  visualized. Aortic valve regurgitation  is not visualized. Mild to moderate aortic valve sclerosis/calcification  is present, without any evidence of aortic stenosis.  6. Aortic dilatation noted. There is mild dilatation of the aortic root,  measuring 38 mm. There is borderline dilatation of the ascending aorta,  measuring 36 mm.  7. No valvular vegetations visualized.   Comparison(s): Compared to prior TTE in 06/2020, the LVEF appears mildly  reduced 45-50%. LV trabeculations were not well visualzed like they were  on previous study.   Conclusion(s)/Recommendation(s): No evidence of valvular vegetations on  this transthoracic echocardiogram. Would recommend a transesophageal  echocardiogram to exclude infective endocarditis if clinically indicated.   FINDINGS  Left Ventricle: Left ventricular ejection fraction, by estimation, is 45  to 50%. The left ventricle has mildly decreased function. The left  ventricle demonstrates global hypokinesis. The left ventricular internal  cavity size was normal in size. There is  mild left ventricular hypertrophy. Left ventricular diastolic parameters  are consistent with Grade I diastolic dysfunction (impaired relaxation).   Right Ventricle: The right ventricular size is normal. Right vetricular  wall thickness was not well visualized. Right ventricular systolic  function is normal. Tricuspid regurgitation signal is inadequate for  assessing PA pressure.   Left Atrium: Left atrial size was severely dilated.   Right Atrium: Right atrial size was normal in size.   Pericardium: There is no evidence of pericardial effusion.   Mitral Valve: The mitral valve is abnormal. There is mild thickening of  the mitral valve leaflet(s). There is mild calcification of the mitral  valve leaflet(s). Mild to moderate mitral annular calcification. Trivial  mitral valve regurgitation. MV peak  gradient, 4.4 mmHg. The mean mitral valve gradient is 2.0 mmHg.    Tricuspid Valve: The tricuspid valve is normal in structure. Tricuspid  valve regurgitation is trivial.   Aortic Valve: The aortic valve was not well visualized. Aortic valve  regurgitation is not visualized. Mild to moderate aortic valve  sclerosis/calcification is present, without any evidence of aortic  stenosis. Aortic valve mean gradient measures 4.0 mmHg.  Aortic valve peak gradient measures 7.1 mmHg. Aortic valve area, by VTI  measures 3.57 cm.   Pulmonic Valve: The pulmonic valve was normal in structure. Pulmonic valve  regurgitation is trivial.   Aorta: The aortic root is normal in size and structure and aortic  dilatation noted. There is mild dilatation of the aortic root, measuring  38 mm. There is borderline dilatation of the ascending aorta, measuring 36  mm.   IAS/Shunts: No atrial level shunt detected by color flow Doppler.     LEFT VENTRICLE  PLAX 2D  LVIDd:     4.20 cm   Diastology  LVIDs:     3.80 cm   LV e' medial:  5.66 cm/s  LV PW:     1.70 cm   LV E/e' medial: 14.3  LV IVS:  1.40 cm   LV e' lateral:  11.50 cm/s  LVOT diam:   2.40 cm   LV E/e' lateral: 7.0  LV SV:     76  LV SV Index:  33  LVOT Area:   4.52 cm    LV Volumes (MOD)  LV vol d, MOD A2C: 169.0 ml  LV vol d, MOD A4C: 212.0 ml  LV vol s, MOD A2C: 96.3 ml  LV vol s, MOD A4C: 119.0 ml  LV SV MOD A2C:   72.7 ml  LV SV MOD A4C:   212.0 ml  LV SV MOD BP:   83.4 ml   RIGHT VENTRICLE  RV Basal diam: 3.20 cm  RV S prime:   13.20 cm/s  TAPSE (M-mode): 2.6 cm   LEFT ATRIUM       Index    RIGHT ATRIUM      Index  LA diam:    4.30 cm 1.89 cm/m RA Area:   20.30 cm  LA Vol (A2C):  132.0 ml 57.94 ml/m RA Volume:  59.60 ml 26.16 ml/m  LA Vol (A4C):  102.0 ml 44.77 ml/m  LA Biplane Vol: 118.0 ml 51.80 ml/m  AORTIC VALVE  AV Area (Vmax):  3.24 cm  AV Area (Vmean):  3.27 cm  AV Area (VTI):   3.57  cm  AV Vmax:      133.00 cm/s  AV Vmean:     90.700 cm/s  AV VTI:      0.213 m  AV Peak Grad:   7.1 mmHg  AV Mean Grad:   4.0 mmHg  LVOT Vmax:     95.30 cm/s  LVOT Vmean:    65.500 cm/s  LVOT VTI:     0.168 m  LVOT/AV VTI ratio: 0.79    AORTA  Ao Root diam: 3.90 cm  Ao Asc diam: 3.60 cm   MITRAL VALVE  MV Area (PHT): 3.30 cm  SHUNTS  MV Area VTI:  2.95 cm  Systemic VTI: 0.17 m  MV Peak grad: 4.4 mmHg  Systemic Diam: 2.40 cm  MV Mean grad: 2.0 mmHg  MV Vmax:    1.05 m/s  MV Vmean:   65.9 cm/s  MV Decel Time: 230 msec  MV E velocity: 81.00 cm/s  MV A velocity: 85.30 cm/s  MV E/A ratio: 0.95   Antimicrobials:  Anti-infectives (From admission, onward)   Start     Dose/Rate Route Frequency Ordered Stop   09/08/20 1200  vancomycin (VANCOCIN) IVPB 1000 mg/200 mL premix        1,000 mg 200 mL/hr over 60 Minutes Intravenous Every M-W-F (Hemodialysis) 09/05/20 1227     09/05/20 1300  vancomycin (VANCOREADY) IVPB 1000 mg/200 mL        1,000 mg 200 mL/hr over 60 Minutes Intravenous  Once 09/05/20 1227 09/05/20 1601   09/04/20 1800  ceFEPIme (MAXIPIME) 1 g in sodium chloride 0.9 % 100 mL IVPB  Status:  Discontinued        1 g 200 mL/hr over 30 Minutes Intravenous Every 24 hours 09/03/20 1704 09/04/20 1412   09/04/20 1115  vancomycin (VANCOREADY) IVPB 1000 mg/200 mL        1,000 mg 200 mL/hr over 60 Minutes Intravenous  Once 09/04/20 1016 09/04/20 1311   09/03/20 1703  vancomycin variable dose per unstable renal function (pharmacist dosing)  Status:  Discontinued         Does not apply See admin instructions 09/03/20 1704 09/05/20 1227   09/03/20  1700  vancomycin (VANCOCIN) 2,500 mg in sodium chloride 0.9 % 500 mL IVPB        2,500 mg 250 mL/hr over 120 Minutes Intravenous  Once 09/03/20 1656 09/03/20 2045   09/03/20 1700  ceFEPIme (MAXIPIME) 2 g in sodium chloride 0.9 % 100 mL IVPB        2 g 200 mL/hr over 30 Minutes  Intravenous  Once 09/03/20 1656 09/03/20 1900        Subjective: Seen and examined at the bedside and he states that he is doing better.  Did not sleep as well.  No nausea or vomiting.  Denies any shortness of breath and not wearing any supplemental oxygen via nasal cannula.  Feels okay.  Denies any chest pain or lightheadedness or dizziness.  No other concerns or complaints at this time  Objective: Vitals:   09/05/20 1805 09/05/20 2154 09/06/20 0420 09/06/20 0923  BP: 138/86 114/69 (!) 148/82 136/78  Pulse: 88 78 82 79  Resp: 18 14 14 18   Temp: 98.2 F (36.8 C) 98.4 F (36.9 C) 98.5 F (36.9 C) 98.7 F (37.1 C)  TempSrc: Oral Oral Oral Oral  SpO2: 94% 97% 97% 97%  Weight:      Height:        Intake/Output Summary (Last 24 hours) at 09/06/2020 1259 Last data filed at 09/06/2020 1258 Gross per 24 hour  Intake 679.01 ml  Output 0 ml  Net 679.01 ml   Filed Weights   09/04/20 2133 09/05/20 0803 09/05/20 1137  Weight: 110.7 kg 109.3 kg 106.2 kg   Examination: Physical Exam:  Constitutional: WN/WD obese Caucasian male in NAD and appears calm and comfortable Eyes: Lids and conjunctivae normal, sclerae anicteric  ENMT: External Ears, Nose appear normal. Grossly normal hearing.  Neck: Appears normal, supple, no cervical masses, normal ROM, no appreciable thyromegaly; no JVD Respiratory: Diminished to auscultation bilaterally with coarse breath sounds, no wheezing, rales, rhonchi or crackles. Normal respiratory effort and patient is not tachypenic. No accessory muscle use. Unlabored breathing and is no longer wearing Supplemental O2 via Superior Cardiovascular: RRR, no murmurs / rubs / gallops. S1 and S2 auscultated. Has some mild LE edema Abdomen: Soft, non-tender, Distended 2/2 to body habitus. Bowel sounds positive.  GU: Deferred. Musculoskeletal: No clubbing / cyanosis of digits/nails. No joint deformity upper and lower extremities. Left forearm AVF.   Skin: No rashes, lesions,  ulcers on a limited skin evaluation. No induration; Warm and dry.  Neurologic: CN 2-12 grossly intact with no focal deficits. Romberg sign and cerebellar reflexes not assessed.  Psychiatric: Normal judgment and insight. Alert and oriented x 3. Normal mood and appropriate affect.   Data Reviewed: I have personally reviewed following labs and imaging studies  CBC: Recent Labs  Lab 08/31/20 2112 09/01/20 1758 09/02/20 1300 09/03/20 0938 09/04/20 0153 09/05/20 0559 09/06/20 0146  WBC 9.3   < > 5.7 6.8 16.9* 5.7 4.9  NEUTROABS 7.3  --   --  5.4 16.0* 5.0 3.4  HGB 10.1*   < > 8.5* 9.2* 9.2* 8.8* 8.5*  HCT 29.9*   < > 25.7* 27.1* 27.3* 26.9* 25.7*  MCV 90.3   < > 90.5 90.9 91.0 91.5 92.1  PLT 199   < > 188 230 166 139* 138*   < > = values in this interval not displayed.   Basic Metabolic Panel: Recent Labs  Lab 09/01/20 1758 09/02/20 0134 09/03/20 5638 09/04/20 0153 09/04/20 0827 09/05/20 0559 09/06/20 0146  NA 134*  133*   < > 132* 133* 132* 129* 131*  K 4.6  4.5   < > 4.7 5.2* 4.2 4.4 3.9  CL 96*  96*   < > 97* 98 95* 95* 94*  CO2 22  22   < > 21* 21* 24 23 25   GLUCOSE 157*  157*   < > 154* 189* 156* 150* 144*  BUN 89*  90*   < > 76* 75* 42* 67* 45*  CREATININE 14.17*  14.27*   < > 12.66* 12.99* 8.27* 11.02* 8.18*  CALCIUM 8.5*  8.5*   < > 8.5* 8.5* 8.7* 8.7* 8.7*  MG  --   --  2.3 2.0  --  2.5* 2.2  PHOS 9.1*  --  9.2* 8.3*  --  7.1* 5.6*   < > = values in this interval not displayed.   GFR: Estimated Creatinine Clearance: 12.4 mL/min (A) (by C-G formula based on SCr of 8.18 mg/dL (H)). Liver Function Tests: Recent Labs  Lab 08/31/20 2112 09/01/20 1758 09/03/20 0938 09/04/20 0153 09/05/20 0559 09/06/20 0146  AST 13*  --  11* 9* 11* 14*  ALT 13  --  12 11 12 14   ALKPHOS 66  --  50 47 46 48  BILITOT 0.9  --  0.7 1.0 0.7 0.9  PROT 7.0  --  6.3* 6.3* 5.9* 5.8*  ALBUMIN 3.4* 2.9* 3.0* 2.8* 2.6* 2.5*   No results for input(s): LIPASE, AMYLASE in the last  168 hours. Recent Labs  Lab 08/31/20 2341  AMMONIA 16   Coagulation Profile: No results for input(s): INR, PROTIME in the last 168 hours. Cardiac Enzymes: No results for input(s): CKTOTAL, CKMB, CKMBINDEX, TROPONINI in the last 168 hours. BNP (last 3 results) No results for input(s): PROBNP in the last 8760 hours. HbA1C: No results for input(s): HGBA1C in the last 72 hours. CBG: Recent Labs  Lab 09/05/20 1212 09/05/20 1639 09/05/20 2150 09/06/20 0638 09/06/20 1235  GLUCAP 190* 179* 163* 138* 136*   Lipid Profile: No results for input(s): CHOL, HDL, LDLCALC, TRIG, CHOLHDL, LDLDIRECT in the last 72 hours. Thyroid Function Tests: No results for input(s): TSH, T4TOTAL, FREET4, T3FREE, THYROIDAB in the last 72 hours. Anemia Panel: Recent Labs    09/04/20 0153  VITAMINB12 357  FOLATE 20.3  FERRITIN 714*  TIBC 213*  IRON 27*  RETICCTPCT 0.9   Sepsis Labs: Recent Labs  Lab 09/03/20 1816 09/04/20 0153 09/05/20 0559  PROCALCITON 1.82 64.33 133.23    Recent Results (from the past 240 hour(s))  Resp Panel by RT-PCR (Flu A&B, Covid) Nasopharyngeal Swab     Status: None   Collection Time: 08/31/20 11:43 PM   Specimen: Nasopharyngeal Swab; Nasopharyngeal(NP) swabs in vial transport medium  Result Value Ref Range Status   SARS Coronavirus 2 by RT PCR NEGATIVE NEGATIVE Final    Comment: (NOTE) SARS-CoV-2 target nucleic acids are NOT DETECTED.  The SARS-CoV-2 RNA is generally detectable in upper respiratory specimens during the acute phase of infection. The lowest concentration of SARS-CoV-2 viral copies this assay can detect is 138 copies/mL. A negative result does not preclude SARS-Cov-2 infection and should not be used as the sole basis for treatment or other patient management decisions. A negative result may occur with  improper specimen collection/handling, submission of specimen other than nasopharyngeal swab, presence of viral mutation(s) within the areas  targeted by this assay, and inadequate number of viral copies(<138 copies/mL). A negative result must be combined with clinical observations, patient history,  and epidemiological information. The expected result is Negative.  Fact Sheet for Patients:  EntrepreneurPulse.com.au  Fact Sheet for Healthcare Providers:  IncredibleEmployment.be  This test is no t yet approved or cleared by the [place] FDA and  has been authorized for detection and/or diagnosis of SARS-CoV-2 by FDA under an Emergency Use Authorization (EUA). This EUA will remain  in effect (meaning this test can be used) for the duration of the COVID-19 declaration under Section 564(b)(1) of the Act, 21 U.S.C.section 360bbb-3(b)(1), unless the authorization is terminated  or revoked sooner.       Influenza A by PCR NEGATIVE NEGATIVE Final   Influenza B by PCR NEGATIVE NEGATIVE Final    Comment: (NOTE) The Xpert Xpress SARS-CoV-2/FLU/RSV plus assay is intended as an aid in the diagnosis of influenza from Nasopharyngeal swab specimens and should not be used as a sole basis for treatment. Nasal washings and aspirates are unacceptable for Xpert Xpress SARS-CoV-2/FLU/RSV testing.  Fact Sheet for Patients: EntrepreneurPulse.com.au  Fact Sheet for Healthcare Providers: [email or web address]  This test is not yet approved or cleared by the [place] FDA and has been authorized for detection and/or diagnosis of SARS-CoV-2 by FDA under an Emergency Use Authorization (EUA). This EUA will remain in effect (meaning this test can be used) for the duration of the COVID-19 declaration under Section 564(b)(1) of the Act, 21 U.S.C. section 360bbb-3(b)(1), unless the authorization is terminated or revoked.  Performed at [facility], [place] [address] [place], [place] [address]   Gastrointestinal Panel by PCR , Stool     Status: None    Collection Time: 09/01/20  4:05 AM   Specimen: Stool  Result Value Ref Range Status   Campylobacter species NOT DETECTED NOT DETECTED Final   Plesimonas shigelloides NOT DETECTED NOT DETECTED Final   Salmonella species NOT DETECTED NOT DETECTED Final   Yersinia enterocolitica NOT DETECTED NOT DETECTED Final   Vibrio species NOT DETECTED NOT DETECTED Final   Vibrio cholerae NOT DETECTED NOT DETECTED Final   Enteroaggregative E coli (EAEC) NOT DETECTED NOT DETECTED Final   Enteropathogenic E coli (EPEC) NOT DETECTED NOT DETECTED Final   Enterotoxigenic E coli (ETEC) NOT DETECTED NOT DETECTED Final   Shiga like toxin producing E coli (STEC) NOT DETECTED NOT DETECTED Final   Shigella/Enteroinvasive E coli (EIEC) NOT DETECTED NOT DETECTED Final   Cryptosporidium NOT DETECTED NOT DETECTED Final   Cyclospora cayetanensis NOT DETECTED NOT DETECTED Final   Entamoeba histolytica NOT DETECTED NOT DETECTED Final   Giardia lamblia NOT DETECTED NOT DETECTED Final   Adenovirus F40/41 NOT DETECTED NOT DETECTED Final   Astrovirus NOT DETECTED NOT DETECTED Final   Norovirus GI/GII NOT DETECTED NOT DETECTED Final   Rotavirus A NOT DETECTED NOT DETECTED Final   Sapovirus (I, II, IV, and V) NOT DETECTED NOT DETECTED Final    Comment: Performed at [facility], [address] [place], [place] [address]  C Difficile Quick Screen w PCR reflex     Status: None   Collection Time: 09/01/20 12:03 PM   Specimen: STOOL  Result Value Ref Range Status   C Diff antigen NEGATIVE NEGATIVE Final   C Diff toxin NEGATIVE NEGATIVE Final   C Diff interpretation No C. difficile detected.  Final    Comment: Performed at [facility], [place] [address], [place], [place] [address]  MRSA PCR Screening     Status: Abnormal   Collection Time: 09/01/20 12:15 PM   Specimen: Nasopharyngeal  Result Value Ref Range Status   MRSA by PCR POSITIVE (A) NEGATIVE Final    Comment:        The GeneXpert MRSA Assay  (FDA approved for NASAL specimens only), is one component of a comprehensive MRSA colonization surveillance program. It is not intended to diagnose MRSA infection nor to guide or monitor treatment for MRSA infections. RESULT CALLED TO, READ BACK BY AND VERIFIED WITH: A CREWS RN [id number] 09/01/20 A BROWNING Performed at [facility], [place] [address] [place], [place] [address]   Culture, blood (routine x 2)     Status: Abnormal   Collection Time: 09/03/20  6:13 PM   Specimen: BLOOD RIGHT HAND  Result Value Ref Range Status   Specimen Description BLOOD RIGHT HAND  Final   Special Requests   Final    BOTTLES DRAWN AEROBIC ONLY Blood Culture adequate volume   Culture  Setup Time   Final    GRAM POSITIVE COCCI IN CLUSTERS AEROBIC BOTTLE ONLY CRITICAL RESULT CALLED TO, READ BACK BY AND VERIFIED WITH: PHARMD MADISON YATES AT 1324 ON 09/04/20 BY KJ Performed at [facility], [place] [address] [place], [place] [address]    Culture METHICILLIN RESISTANT STAPHYLOCOCCUS AUREUS (A)  Final   Report Status 09/06/2020 FINAL  Final   Organism ID, Bacteria METHICILLIN RESISTANT STAPHYLOCOCCUS AUREUS  Final      Susceptibility   Methicillin resistant staphylococcus aureus - MIC*    CIPROFLOXACIN >=8 RESISTANT Resistant     ERYTHROMYCIN <=0.25 SENSITIVE Sensitive     GENTAMICIN <=0.5 SENSITIVE Sensitive     OXACILLIN >=4 RESISTANT Resistant     TETRACYCLINE <=1 SENSITIVE Sensitive     VANCOMYCIN <=0.5 SENSITIVE Sensitive     TRIMETH/SULFA <=10 SENSITIVE Sensitive     CLINDAMYCIN <=0.25 SENSITIVE Sensitive     RIFAMPIN <=0.5 SENSITIVE Sensitive     Inducible Clindamycin NEGATIVE Sensitive     * METHICILLIN RESISTANT STAPHYLOCOCCUS AUREUS  Blood Culture ID Panel (Reflexed)     Status: Abnormal   Collection Time: 09/03/20  6:13 PM  Result Value Ref Range Status   Enterococcus faecalis NOT DETECTED NOT DETECTED Final   Enterococcus Faecium NOT DETECTED NOT DETECTED Final   Listeria  monocytogenes NOT DETECTED NOT DETECTED Final   Staphylococcus species DETECTED (A) NOT DETECTED Final    Comment: CRITICAL RESULT CALLED TO, READ BACK BY AND VERIFIED WITH: PHARMD MADSION YATES AT 1325 ON 09/04/20 BY KJ    Staphylococcus aureus (BCID) DETECTED (A) NOT DETECTED Final    Comment: Methicillin (oxacillin)-resistant Staphylococcus aureus (MRSA). MRSA is predictably resistant to beta-lactam antibiotics (except ceftaroline). Preferred therapy is vancomycin unless clinically contraindicated. Patient requires contact precautions if  hospitalized. CRITICAL RESULT CALLED TO, READ BACK BY AND VERIFIED WITH: PHARMD MADISON YATES AT 7253 ON 09/04/20 BY KJ    Staphylococcus epidermidis NOT DETECTED NOT DETECTED Final   Staphylococcus lugdunensis NOT DETECTED NOT DETECTED Final   Streptococcus species NOT DETECTED NOT DETECTED Final   Streptococcus agalactiae NOT DETECTED NOT DETECTED Final   Streptococcus pneumoniae NOT DETECTED NOT DETECTED Final   Streptococcus pyogenes NOT DETECTED NOT DETECTED Final   A.calcoaceticus-baumannii NOT DETECTED NOT DETECTED Final   Bacteroides fragilis NOT DETECTED NOT DETECTED Final   Enterobacterales NOT DETECTED NOT DETECTED Final   Enterobacter cloacae complex NOT DETECTED NOT DETECTED Final   Escherichia coli NOT DETECTED NOT DETECTED Final   Klebsiella aerogenes NOT DETECTED NOT DETECTED Final   Klebsiella oxytoca NOT DETECTED NOT DETECTED  Final   Klebsiella pneumoniae NOT DETECTED NOT DETECTED Final   Proteus species NOT DETECTED NOT DETECTED Final   Salmonella species NOT DETECTED NOT DETECTED Final   Serratia marcescens NOT DETECTED NOT DETECTED Final   Haemophilus influenzae NOT DETECTED NOT DETECTED Final   Neisseria meningitidis NOT DETECTED NOT DETECTED Final   Pseudomonas aeruginosa NOT DETECTED NOT DETECTED Final   Stenotrophomonas maltophilia NOT DETECTED NOT DETECTED Final   Candida albicans NOT DETECTED NOT DETECTED Final   Candida  auris NOT DETECTED NOT DETECTED Final   Candida glabrata NOT DETECTED NOT DETECTED Final   Candida krusei NOT DETECTED NOT DETECTED Final   Candida parapsilosis NOT DETECTED NOT DETECTED Final   Candida tropicalis NOT DETECTED NOT DETECTED Final   Cryptococcus neoformans/gattii NOT DETECTED NOT DETECTED Final   Meth resistant mecA/C and MREJ DETECTED (A) NOT DETECTED Final    Comment: CRITICAL RESULT CALLED TO, READ BACK BY AND VERIFIED WITH: PHARMD MADISON YATES AT 1325 BY KJ ON 09/03/20 Performed at [facility], [address] [place], [place] [address]   Culture, blood (routine x 2)     Status: Abnormal   Collection Time: 09/03/20  6:15 PM   Specimen: BLOOD RIGHT HAND  Result Value Ref Range Status   Specimen Description BLOOD RIGHT HAND  Final   Special Requests   Final    BOTTLES DRAWN AEROBIC ONLY Blood Culture adequate volume   Culture  Setup Time GRAM POSITIVE COCCI AEROBIC BOTTLE ONLY   Final   Culture (A)  Final    STAPHYLOCOCCUS AUREUS SUSCEPTIBILITIES PERFORMED ON PREVIOUS CULTURE WITHIN THE LAST 5 DAYS. Performed at [facility], [place] [address], [place], [place] [address]    Report Status 09/06/2020 FINAL  Final  Culture, Urine     Status: Abnormal   Collection Time: 09/03/20  9:39 PM   Specimen: Urine, Random  Result Value Ref Range Status   Specimen Description URINE, RANDOM  Final   Special Requests NONE  Final   Culture (A)  Final    <10,000 COLONIES/mL INSIGNIFICANT GROWTH Performed at [facility], [place] [address] [place], [place] [address]    Report Status 09/06/2020 FINAL  Final     RN Pressure Injury Documentation:     Estimated body mass index is 31.75 kg/m as calculated from the following:   Height as of this encounter: 6' (1.829 m).   Weight as of this encounter: 106.2 kg.  Malnutrition Type:   Malnutrition Characteristics:   Nutrition Interventions:    Radiology Studies: ECHOCARDIOGRAM COMPLETE  Result Date:  09/05/2020    ECHOCARDIOGRAM REPORT   Patient Name:   Scott Castillo Salatino Date of Exam: 09/05/2020 Medical Rec #:  [id number]       Height:       72.0 in Accession #:    [id number]      Weight:       234.1 lb Date of Birth:  [birth year]       BSA:          2.278 m Patient Age:    32 years        BP:           124/56 mmHg Patient Gender: M               HR:           84 bpm. Exam Location:  Inpatient Procedure: 2D Echo, Cardiac Doppler and Color Doppler Indications:  Bacteremia  History:        Patient has prior history of Echocardiogram examinations, most                 recent 06/17/2020. Risk Factors:Diabetes and Hypertension. ESRD.                 MRSA bacteremia.  Sonographer:    Clayton Lefort RDCS (AE) Referring Phys: [id number] [place]  Sonographer Comments: No subcostal window and suboptimal parasternal window. IMPRESSIONS  1. Left ventricular ejection fraction, by estimation, is 45 to 50%. The left ventricle has mildly decreased function. The left ventricle demonstrates global hypokinesis. There is mild left ventricular hypertrophy. Left ventricular diastolic parameters are consistent with Grade I diastolic dysfunction (impaired relaxation).  2. Right ventricular systolic function is normal. The right ventricular size is normal. Tricuspid regurgitation signal is inadequate for assessing PA pressure.  3. Left atrial size was severely dilated.  4. The mitral valve is abnormal. There is mild thickening of the mitral valve leaflet(s). There is mild calcification of the mitral valve leaflet(s). Trivial mitral valve regurgitation.  5. The aortic valve was not well visualized. Aortic valve regurgitation is not visualized. Mild to moderate aortic valve sclerosis/calcification is present, without any evidence of aortic stenosis.  6. Aortic dilatation noted. There is mild dilatation of the aortic root, measuring 38 mm. There is borderline dilatation of the ascending aorta, measuring 36 mm.  7. No valvular vegetations  visualized. Comparison(s): Compared to prior TTE in 06/2020, the LVEF appears mildly reduced 45-50%. LV trabeculations were not well visualzed like they were on previous study. Conclusion(s)/Recommendation(s): No evidence of valvular vegetations on this transthoracic echocardiogram. Would recommend a transesophageal echocardiogram to exclude infective endocarditis if clinically indicated. FINDINGS  Left Ventricle: Left ventricular ejection fraction, by estimation, is 45 to 50%. The left ventricle has mildly decreased function. The left ventricle demonstrates global hypokinesis. The left ventricular internal cavity size was normal in size. There is  mild left ventricular hypertrophy. Left ventricular diastolic parameters are consistent with Grade I diastolic dysfunction (impaired relaxation). Right Ventricle: The right ventricular size is normal. Right vetricular wall thickness was not well visualized. Right ventricular systolic function is normal. Tricuspid regurgitation signal is inadequate for assessing PA pressure. Left Atrium: Left atrial size was severely dilated. Right Atrium: Right atrial size was normal in size. Pericardium: There is no evidence of pericardial effusion. Mitral Valve: The mitral valve is abnormal. There is mild thickening of the mitral valve leaflet(s). There is mild calcification of the mitral valve leaflet(s). Mild to moderate mitral annular calcification. Trivial mitral valve regurgitation. MV peak gradient, 4.4 mmHg. The mean mitral valve gradient is 2.0 mmHg. Tricuspid Valve: The tricuspid valve is normal in structure. Tricuspid valve regurgitation is trivial. Aortic Valve: The aortic valve was not well visualized. Aortic valve regurgitation is not visualized. Mild to moderate aortic valve sclerosis/calcification is present, without any evidence of aortic stenosis. Aortic valve mean gradient measures 4.0 mmHg.  Aortic valve peak gradient measures 7.1 mmHg. Aortic valve area, by VTI  measures 3.57 cm. Pulmonic Valve: The pulmonic valve was normal in structure. Pulmonic valve regurgitation is trivial. Aorta: The aortic root is normal in size and structure and aortic dilatation noted. There is mild dilatation of the aortic root, measuring 38 mm. There is borderline dilatation of the ascending aorta, measuring 36 mm. IAS/Shunts: No atrial level shunt detected by color flow Doppler.  LEFT VENTRICLE PLAX 2D LVIDd:  4.20 cm      Diastology LVIDs:         3.80 cm      LV e' medial:    5.66 cm/s LV PW:         1.70 cm      LV E/e' medial:  14.3 LV IVS:        1.40 cm      LV e' lateral:   11.50 cm/s LVOT diam:     2.40 cm      LV E/e' lateral: 7.0 LV SV:         76 LV SV Index:   33 LVOT Area:     4.52 cm  LV Volumes (MOD) LV vol d, MOD A2C: 169.0 ml LV vol d, MOD A4C: 212.0 ml LV vol s, MOD A2C: 96.3 ml LV vol s, MOD A4C: 119.0 ml LV SV MOD A2C:     72.7 ml LV SV MOD A4C:     212.0 ml LV SV MOD BP:      83.4 ml RIGHT VENTRICLE RV Basal diam:  3.20 cm RV S prime:     13.20 cm/s TAPSE (M-mode): 2.6 cm LEFT ATRIUM              Index       RIGHT ATRIUM           Index LA diam:        4.30 cm  1.89 cm/m  RA Area:     20.30 cm LA Vol (A2C):   132.0 ml 57.94 ml/m RA Volume:   59.60 ml  26.16 ml/m LA Vol (A4C):   102.0 ml 44.77 ml/m LA Biplane Vol: 118.0 ml 51.80 ml/m  AORTIC VALVE AV Area (Vmax):    3.24 cm AV Area (Vmean):   3.27 cm AV Area (VTI):     3.57 cm AV Vmax:           133.00 cm/s AV Vmean:          90.700 cm/s AV VTI:            0.213 m AV Peak Grad:      7.1 mmHg AV Mean Grad:      4.0 mmHg LVOT Vmax:         95.30 cm/s LVOT Vmean:        65.500 cm/s LVOT VTI:          0.168 m LVOT/AV VTI ratio: 0.79  AORTA Ao Root diam: 3.90 cm Ao Asc diam:  3.60 cm MITRAL VALVE MV Area (PHT): 3.30 cm    SHUNTS MV Area VTI:   2.95 cm    Systemic VTI:  0.17 m MV Peak grad:  4.4 mmHg    Systemic Diam: 2.40 cm MV Mean grad:  2.0 mmHg MV Vmax:       1.05 m/s MV Vmean:      65.9 cm/s MV Decel Time:  230 msec MV E velocity: 81.00 cm/s MV A velocity: 85.30 cm/s MV E/A ratio:  0.95 Gwyndolyn Kaufman MD Electronically signed by Gwyndolyn Kaufman MD Signature Date/Time: 09/05/2020/6:33:08 PM    Final    Scheduled Meds: . calcitRIOL  2.25 mcg Oral Q M,W,F-HD  . carvedilol  25 mg Oral BID WC  . Chlorhexidine Gluconate Cloth  6 each Topical Q0600  . darbepoetin (ARANESP) injection - DIALYSIS  60 mcg Intravenous Q Wed-HD  . ferric citrate  630 mg Oral TID WC  . insulin aspart  0-5  Units Subcutaneous QHS  . insulin aspart  0-6 Units Subcutaneous TID WC  . irbesartan  150 mg Oral Daily  . pantoprazole  40 mg Oral Q1200  . pravastatin  80 mg Oral QPM   Continuous Infusions: . [START ON 09/08/2020] vancomycin      LOS: 4 days   Kerney Elbe, DO [facility] PAGER is on [place]  If 7PM-7AM, please contact night-coverage [email or web address]

## 2020-09-06 NOTE — Progress Notes (Addendum)
[facility] Progress Note   Subjective:    Seen in room. No complaints this am. Tolerated dialysis well yesterday net UF 3L    Objective Vitals:   09/05/20 1805 09/05/20 2154 09/06/20 0420 09/06/20 0923  BP: 138/86 114/69 (!) 148/82 136/78  Pulse: 88 78 82 79  Resp: 18 14 14 18   Temp: 98.2 F (36.8 C) 98.4 F (36.9 C) 98.5 F (36.9 C) 98.7 F (37.1 C)  TempSrc: Oral Oral Oral Oral  SpO2: 94% 97% 97% 97%  Weight:      Height:       Physical Exam General: chronicially ill appearing male in NAD, AAOx3 Heart: RRR, no mrg Lungs: CTAB anterolaterally, nml WOB on 2L via [place] Abdomen: soft, NTND Extremities: trace edema, +chronic venous stasis /skin changes. Erythema around IV site on R hand Dialysis Access: LU AVF +bruiut   Filed Weights   09/04/20 2133 09/05/20 0803 09/05/20 1137  Weight: 110.7 kg 109.3 kg 106.2 kg    Intake/Output Summary (Last 24 hours) at 09/06/2020 0947 Last data filed at 09/05/2020 2200 Gross per 24 hour  Intake 319.01 ml  Output 3000 ml  Net -2680.99 ml    Additional Objective Labs: Basic Metabolic Panel: Recent Labs  Lab 09/04/20 0153 09/04/20 0827 09/05/20 0559 09/06/20 0146  NA 133* 132* 129* 131*  K 5.2* 4.2 4.4 3.9  CL 98 95* 95* 94*  CO2 21* 24 23 25   GLUCOSE 189* 156* 150* 144*  BUN 75* 42* 67* 45*  CREATININE 12.99* 8.27* 11.02* 8.18*  CALCIUM 8.5* 8.7* 8.7* 8.7*  PHOS 8.3*  --  7.1* 5.6*   Liver Function Tests: Recent Labs  Lab 09/04/20 0153 09/05/20 0559 09/06/20 0146  AST 9* 11* 14*  ALT 11 12 14   ALKPHOS 47 46 48  BILITOT 1.0 0.7 0.9  PROT 6.3* 5.9* 5.8*  ALBUMIN 2.8* 2.6* 2.5*   CBC: Recent Labs  Lab 09/02/20 1300 09/03/20 0938 09/04/20 0153 09/05/20 0559 09/06/20 0146  WBC 5.7 6.8 16.9* 5.7 4.9  NEUTROABS  --  5.4 16.0* 5.0 3.4  HGB 8.5* 9.2* 9.2* 8.8* 8.5*  HCT 25.7* 27.1* 27.3* 26.9* 25.7*  MCV 90.5 90.9 91.0 91.5 92.1  PLT 188 230 166 139* 138*   Blood Culture    Component Value  Date/Time   SDES BLOOD RIGHT HAND 09/03/2020 1815   SPECREQUEST  09/03/2020 1815    BOTTLES DRAWN AEROBIC ONLY Blood Culture adequate volume   CULT (A) 09/03/2020 1815    STAPHYLOCOCCUS AUREUS SUSCEPTIBILITIES PERFORMED ON PREVIOUS CULTURE WITHIN THE LAST 5 DAYS. Performed at [facility], [place] [address], [place], [place] [address]    REPTSTATUS 09/06/2020 FINAL 09/03/2020 1815    CBG: Recent Labs  Lab 09/05/20 0641 09/05/20 1212 09/05/20 1639 09/05/20 2150 09/06/20 0638  GLUCAP 146* 190* 179* 163* 138*   Iron Studies:  Recent Labs    09/04/20 0153  IRON 27*  TIBC 213*  FERRITIN 714*   Lab Results  Component Value Date   INR 1.2 06/16/2020   Studies/Results: DG CHEST PORT 1 VIEW  Result Date: 09/04/2020 CLINICAL DATA:  Altered mental status. EXAM: PORTABLE CHEST 1 VIEW COMPARISON:  Same day. FINDINGS: Stable cardiomediastinal silhouette. No pneumothorax or pleural effusion is noted. Right lung is clear. Minimal residual left lung opacities are noted. Bony thorax is unremarkable. IMPRESSION: Minimal residual left lung opacities are noted. Electronically Signed   By: Marijo Conception M.D.   On: 09/04/2020 14:22  ECHOCARDIOGRAM COMPLETE  Result Date: 09/05/2020    ECHOCARDIOGRAM REPORT   Patient Name:   Scott Castillo Date of Exam: 09/05/2020 Medical Rec #:  [id number]       Height:       72.0 in Accession #:    [id number]      Weight:       234.1 lb Date of Birth:  1960/04/27       BSA:          2.278 m Patient Age:    60 years        BP:           124/56 mmHg Patient Gender: M               HR:           84 bpm. Exam Location:  Inpatient Procedure: 2D Echo, Cardiac Doppler and Color Doppler Indications:    Bacteremia  History:        Patient has prior history of Echocardiogram examinations, most                 recent 06/17/2020. Risk Factors:Diabetes and Hypertension. ESRD.                 MRSA bacteremia.  Sonographer:    Clayton Lefort RDCS (AE) Referring Phys: [id number]  [place]  Sonographer Comments: No subcostal window and suboptimal parasternal window. IMPRESSIONS  1. Left ventricular ejection fraction, by estimation, is 45 to 50%. The left ventricle has mildly decreased function. The left ventricle demonstrates global hypokinesis. There is mild left ventricular hypertrophy. Left ventricular diastolic parameters are consistent with Grade I diastolic dysfunction (impaired relaxation).  2. Right ventricular systolic function is normal. The right ventricular size is normal. Tricuspid regurgitation signal is inadequate for assessing PA pressure.  3. Left atrial size was severely dilated.  4. The mitral valve is abnormal. There is mild thickening of the mitral valve leaflet(s). There is mild calcification of the mitral valve leaflet(s). Trivial mitral valve regurgitation.  5. The aortic valve was not well visualized. Aortic valve regurgitation is not visualized. Mild to moderate aortic valve sclerosis/calcification is present, without any evidence of aortic stenosis.  6. Aortic dilatation noted. There is mild dilatation of the aortic root, measuring 38 mm. There is borderline dilatation of the ascending aorta, measuring 36 mm.  7. No valvular vegetations visualized. Comparison(s): Compared to prior TTE in 06/2020, the LVEF appears mildly reduced 45-50%. LV trabeculations were not well visualzed like they were on previous study. Conclusion(s)/Recommendation(s): No evidence of valvular vegetations on this transthoracic echocardiogram. Would recommend a transesophageal echocardiogram to exclude infective endocarditis if clinically indicated. FINDINGS  Left Ventricle: Left ventricular ejection fraction, by estimation, is 45 to 50%. The left ventricle has mildly decreased function. The left ventricle demonstrates global hypokinesis. The left ventricular internal cavity size was normal in size. There is  mild left ventricular hypertrophy. Left ventricular diastolic parameters are  consistent with Grade I diastolic dysfunction (impaired relaxation). Right Ventricle: The right ventricular size is normal. Right vetricular wall thickness was not well visualized. Right ventricular systolic function is normal. Tricuspid regurgitation signal is inadequate for assessing PA pressure. Left Atrium: Left atrial size was severely dilated. Right Atrium: Right atrial size was normal in size. Pericardium: There is no evidence of pericardial effusion. Mitral Valve: The mitral valve is abnormal. There is mild thickening of the mitral valve leaflet(s). There is mild calcification of the mitral valve leaflet(s). Mild to  moderate mitral annular calcification. Trivial mitral valve regurgitation. MV peak gradient, 4.4 mmHg. The mean mitral valve gradient is 2.0 mmHg. Tricuspid Valve: The tricuspid valve is normal in structure. Tricuspid valve regurgitation is trivial. Aortic Valve: The aortic valve was not well visualized. Aortic valve regurgitation is not visualized. Mild to moderate aortic valve sclerosis/calcification is present, without any evidence of aortic stenosis. Aortic valve mean gradient measures 4.0 mmHg.  Aortic valve peak gradient measures 7.1 mmHg. Aortic valve area, by VTI measures 3.57 cm. Pulmonic Valve: The pulmonic valve was normal in structure. Pulmonic valve regurgitation is trivial. Aorta: The aortic root is normal in size and structure and aortic dilatation noted. There is mild dilatation of the aortic root, measuring 38 mm. There is borderline dilatation of the ascending aorta, measuring 36 mm. IAS/Shunts: No atrial level shunt detected by color flow Doppler.  LEFT VENTRICLE PLAX 2D LVIDd:         4.20 cm      Diastology LVIDs:         3.80 cm      LV e' medial:    5.66 cm/s LV PW:         1.70 cm      LV E/e' medial:  14.3 LV IVS:        1.40 cm      LV e' lateral:   11.50 cm/s LVOT diam:     2.40 cm      LV E/e' lateral: 7.0 LV SV:         76 LV SV Index:   33 LVOT Area:     4.52 cm   LV Volumes (MOD) LV vol d, MOD A2C: 169.0 ml LV vol d, MOD A4C: 212.0 ml LV vol s, MOD A2C: 96.3 ml LV vol s, MOD A4C: 119.0 ml LV SV MOD A2C:     72.7 ml LV SV MOD A4C:     212.0 ml LV SV MOD BP:      83.4 ml RIGHT VENTRICLE RV Basal diam:  3.20 cm RV S prime:     13.20 cm/s TAPSE (M-mode): 2.6 cm LEFT ATRIUM              Index       RIGHT ATRIUM           Index LA diam:        4.30 cm  1.89 cm/m  RA Area:     20.30 cm LA Vol (A2C):   132.0 ml 57.94 ml/m RA Volume:   59.60 ml  26.16 ml/m LA Vol (A4C):   102.0 ml 44.77 ml/m LA Biplane Vol: 118.0 ml 51.80 ml/m  AORTIC VALVE AV Area (Vmax):    3.24 cm AV Area (Vmean):   3.27 cm AV Area (VTI):     3.57 cm AV Vmax:           133.00 cm/s AV Vmean:          90.700 cm/s AV VTI:            0.213 m AV Peak Grad:      7.1 mmHg AV Mean Grad:      4.0 mmHg LVOT Vmax:         95.30 cm/s LVOT Vmean:        65.500 cm/s LVOT VTI:          0.168 m LVOT/AV VTI ratio: 0.79  AORTA Ao Root diam: 3.90 cm Ao Asc diam:  3.60 cm MITRAL  VALVE MV Area (PHT): 3.30 cm    SHUNTS MV Area VTI:   2.95 cm    Systemic VTI:  0.17 m MV Peak grad:  4.4 mmHg    Systemic Diam: 2.40 cm MV Mean grad:  2.0 mmHg MV Vmax:       1.05 m/s MV Vmean:      65.9 cm/s MV Decel Time: 230 msec MV E velocity: 81.00 cm/s MV A velocity: 85.30 cm/s MV E/A ratio:  0.95 Gwyndolyn Kaufman MD Electronically signed by Gwyndolyn Kaufman MD Signature Date/Time: 09/05/2020/6:33:08 PM    Final     Medications: . [START ON 09/08/2020] vancomycin     . calcitRIOL  2.25 mcg Oral Q M,W,F-HD  . carvedilol  25 mg Oral BID WC  . Chlorhexidine Gluconate Cloth  6 each Topical Q0600  . darbepoetin (ARANESP) injection - DIALYSIS  60 mcg Intravenous Q Wed-HD  . ferric citrate  630 mg Oral TID WC  . insulin aspart  0-5 Units Subcutaneous QHS  . insulin aspart  0-6 Units Subcutaneous TID WC  . irbesartan  150 mg Oral Daily  . mupirocin ointment  1 application Nasal BID  . pantoprazole  40 mg Oral Q1200  . pravastatin  80  mg Oral QPM    Dialysis Orders: 4h 57min   MWF 400/500  111.5kg  2/2 bath P2 Hep none  - venofer 50 /wk  - mircera 75 q4, last 4/20 due 5/18  - calcitriol 2.25 ug tiw  - home meds: auryxia 2 ac tid  Assessment/ Plan: 1. Acute resp failure - pulm edema, +/- infection, serial HD over the last 5 days. Repeat CXR improved.  2. MRSA bacteremia  - [facility] 5/18 +Staph aureus   ?source IV site in R hand.  On Vanc.  ID following, No vegetation on TTE.  Repeat BC pending.  3. Hyponatremia - improving with UF on HD.  4. AMS - resolved. Multifactorial etiology - uremia, bacteremia, delirium.  Issues with AMS x2 this week while on HD - unsure of the cause.  No episodes on HD 5/20.  5. ESRD - HD MWF. Had serial HD x 5days.    Next HD on 5/23.  6. HTN/vol  - Blood pressure well controlled.  Volume status improving with serial HD, net 10L off so far. Tolerated 3L UF 5/20 w/o issue   7. Anemia ckd - Hgb 8.5.  Continue aranesp 60ug weekly qWed.  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  Phos elevated.  Cont vdra, increase Auryxia to 3AC TID. 9. Nutrition - Alb low, renal diet w/fluid restrictions, protein supplements.    Lynnda Child PA-C [facility] 09/06/2020,9:47 AM  Seen and examined independently.  Agree with note and exam as documented above by physician extender and as noted here.  General adult male in bed in no acute distress HEENT normocephalic atraumatic extraocular movements intact sclera anicteric Neck supple trachea midline Lungs clear to auscultation bilaterally normal work of breathing at rest  Heart S1S2 no rub Abdomen soft nontender nondistended Extremities no edema  Psych normal mood and affect Access left AVF bruit and thrill   ESRD on HD MWF schedule  MRSA bacteremia - 5/18 blood cultures positive and repeat cultures appear pending.  ID following. On vanc  Secondary hyperpara - continue calcitriol here (will be given outpatient with HD so do not include on discharge  med list)\  AMS - resolved. S/p serial HD   Claudia Desanctis, MD 09/06/2020  10:20 AM

## 2020-09-07 DIAGNOSIS — N186 End stage renal disease: Secondary | ICD-10-CM | POA: Diagnosis not present

## 2020-09-07 DIAGNOSIS — R7881 Bacteremia: Secondary | ICD-10-CM | POA: Diagnosis not present

## 2020-09-07 DIAGNOSIS — J9601 Acute respiratory failure with hypoxia: Secondary | ICD-10-CM | POA: Diagnosis not present

## 2020-09-07 DIAGNOSIS — E1169 Type 2 diabetes mellitus with other specified complication: Secondary | ICD-10-CM | POA: Diagnosis not present

## 2020-09-07 LAB — MAGNESIUM: Magnesium: 2.4 mg/dL (ref 1.7–2.4)

## 2020-09-07 LAB — COMPREHENSIVE METABOLIC PANEL
ALT: 15 U/L (ref 0–44)
AST: 14 U/L — ABNORMAL LOW (ref 15–41)
Albumin: 2.5 g/dL — ABNORMAL LOW (ref 3.5–5.0)
Alkaline Phosphatase: 48 U/L (ref 38–126)
Anion gap: 13 (ref 5–15)
BUN: 64 mg/dL — ABNORMAL HIGH (ref 6–20)
CO2: 24 mmol/L (ref 22–32)
Calcium: 8.4 mg/dL — ABNORMAL LOW (ref 8.9–10.3)
Chloride: 94 mmol/L — ABNORMAL LOW (ref 98–111)
Creatinine, Ser: 10.57 mg/dL — ABNORMAL HIGH (ref 0.61–1.24)
GFR, Estimated: 5 mL/min — ABNORMAL LOW (ref >60)
Glucose, Bld: 191 mg/dL — ABNORMAL HIGH (ref 70–99)
Potassium: 4.1 mmol/L (ref 3.5–5.1)
Sodium: 131 mmol/L — ABNORMAL LOW (ref 135–145)
Total Bilirubin: 0.4 mg/dL (ref 0.3–1.2)
Total Protein: 5.6 g/dL — ABNORMAL LOW (ref 6.5–8.1)

## 2020-09-07 LAB — CBC WITH DIFFERENTIAL/PLATELET
Abs Immature Granulocytes: 0.02 10*3/uL (ref 0.00–0.07)
Basophils Absolute: 0 10*3/uL (ref 0.0–0.1)
Basophils Relative: 1 %
Eosinophils Absolute: 0.2 10*3/uL (ref 0.0–0.5)
Eosinophils Relative: 4 %
HCT: 24.2 % — ABNORMAL LOW (ref 39.0–52.0)
Hemoglobin: 8.1 g/dL — ABNORMAL LOW (ref 13.0–17.0)
Immature Granulocytes: 1 %
Lymphocytes Relative: 17 %
Lymphs Abs: 0.7 10*3/uL (ref 0.7–4.0)
MCH: 30 pg (ref 26.0–34.0)
MCHC: 33.5 g/dL (ref 30.0–36.0)
MCV: 89.6 fL (ref 80.0–100.0)
Monocytes Absolute: 0.7 10*3/uL (ref 0.1–1.0)
Monocytes Relative: 16 %
Neutro Abs: 2.6 10*3/uL (ref 1.7–7.7)
Neutrophils Relative %: 61 %
Platelets: 138 10*3/uL — ABNORMAL LOW (ref 150–400)
RBC: 2.7 MIL/uL — ABNORMAL LOW (ref 4.22–5.81)
RDW: 12.5 % (ref 11.5–15.5)
WBC: 4.2 10*3/uL (ref 4.0–10.5)
nRBC: 0 % (ref 0.0–0.2)

## 2020-09-07 LAB — GLUCOSE, CAPILLARY
Glucose-Capillary: 147 mg/dL — ABNORMAL HIGH (ref 70–99)
Glucose-Capillary: 175 mg/dL — ABNORMAL HIGH (ref 70–99)
Glucose-Capillary: 164 mg/dL — ABNORMAL HIGH (ref 70–99)
Glucose-Capillary: 163 mg/dL — ABNORMAL HIGH (ref 70–99)

## 2020-09-07 LAB — PHOSPHORUS: Phosphorus: 5.6 mg/dL — ABNORMAL HIGH (ref 2.5–4.6)

## 2020-09-07 NOTE — Progress Notes (Signed)
Patient ambulated unassisted twice,he walked with a steady gait with the help of front wheel walker.No complaint while ambulating.

## 2020-09-07 NOTE — Progress Notes (Signed)
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  He was able to answer questions in the ED and did not have any focal deficits.  Blood pressure was elevated systolics into the 564P.  Labs revealed severe nephric and amount of uremia and azotemia and a BUN/creatinine of 119/17.5.  His COVID testing was negative and chest x-ray is negative for any acute findings.  ED provider spoke lead nephrologist who recommended admission for dialysis.  Prior to him coming to the ED he had been ill and was having vomiting and diarrhea and only could tolerate 1 hour dialysis Monday.  His vomiting has now resolved and is not having abdominal pain.  Because he was significantly uremic he was admitted for dialysis and during his dialysis he became acutely agitated and could not complete more than 1/2 hours of treatment while hospitalized.  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  Nephrology continues to dialyze the patient gently daily and recommending a 2 and half hour session today and in 2 and half hour session tomorrow possibly.  Patient's mental status was back to his baseline however he unfortunately decompensated in Dialysis yesterday and a Rapid Response was called.  **Patient became hypoxic and was septic 5/18 and a stat x-ray showed some pulmonary edema.  He was placed on Precedex drip and cultures were obtained showing MRSA Bacteremia and underwent hemodialysis.  His acute hypoxic respiratory failure is  now improved significantly.  Procalcitonin severely elevated in accordance to his severe sepsis with the MRSA Bacteremia.  He was transferred back to the medical floor on 09/05/20 and is improving Mentation wise. ID consulted and recommending repeat Blood Cx and TTE. They may forego the TEE given that his TTE was unrevealing and because repeat Blood Cx show NGTD at 2 Days.   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  There was subtle parenchymal volume loss greater than expected for age -EEG done 09/01/20 and showed that the study was suggestive of moderate diffuse encephalopathy which is nonspecific in etiology and no seizures or epileptiform discharges were seen throughout the recording -Presented with a BUN/Cr of 119/17.52 after missing 2.5 Dialysis Sessions -He has had similar spells in the past with  dialysis when he was admitted last month -Neurology was consulted and work-up was initiated and felt his presentation was most consistent with dialysis disequilibrium syndrome and recommended no further imaging and  recommended compliance with hemodialysis to largely avoid fluctuation of fluid shifts that can be caused by hemodialysis -Nephrology is following appreciate Dialysis recc's -BUN/Cr has improved to 76/12.66 -> 42/8.27 -> 67/11.02 -> 45/8.18 -> 64/10.57 -Nephrology was planning on gentle HD with a run time of 2.5 hours for the next few days to get Azotemia down until he decompensated the day before yesterday and had 4 Liters removed today  -He became extremely encephalopathic on 09/03/2020 likely was multifactorial related to dialysis and on adherence, delirium and sepsis from Bacteremia  -His mentation is much improved and is back to his baseline.  He had to be placed on a Precedex drip to be dialyzed on 09/03/2020 and was watched in the ICU on 09/04/2020 and transferred back to [facility] on 09/05/2020  ESRD with HD MWF Metabolic Acidosis Hyperphosphatemia, Hypermagnesemia -Patient normally gets dialysis in [facility] in [place] and has missed at least 2.5 dialysis sessions PTA -Patient's BUN/Cr is now 64/10.57 -Patient had a mild Metabolic Acidosis but this is improved as CO2 is now 24, AG of 13, and Chloride of 94 -Avoid Nephrotoxic Medications, Contrast Dyes, Hypotension and Renally adjust medications -Nephrology following and since patient is under drwy weight they are recommending Lowering EDW at Discharge -Patient's magnesium level is now 2.4 and phosphorus level is now 5.6 -He Dialysis the day before yesterday netting UF 3 Liters; Next Dialysis Session is 09/08/20 -Repeat CXR 5/19 afternoon showed "Stable cardiomediastinal silhouette. No pneumothorax or pleural effusion is noted. Right lung is clear. Minimal residual left lung opacities are noted. Bony thorax is unremarkable."  -Continue with Calcitriol 2.25 mcg p.o. every Monday Wednesday Friday with hemodialysis and continue with ferric citrate 420 mg p.o. 3 times daily with meals -Repeat CMP int he AM   Acute Respiratory Failure with Hypoxia with  acute pulmonary edema, improved -In the setting of Pulmonary Edema with possible Concurrent Infection -Had to be put on Precedex to be Dialyzed on 5/18 but now off -Had to be placed on BiPAP and then NRB and now not wearing Supplemental O2 today  -SpO2: 98 % O2 Flow Rate (L/min): 1 L/min FiO2 (%): 60 %  -Continuous Pulse Oximetry and Maintain O2 Saturations >90% -Continue to Monitor Respiratory Status Carefully -Repeat CXR in AM and will need an Ambulatory Home O2 Screen Prior to D/C   Severe Sepsis in the setting of MRSA Bacteremia in the setting of ? Infiltrated IV -Patient became Febrile, Tachycardic, Tachypenic with Acute Respiratory Failure with Hypoxia and Altered Mental Status on 09/03/20 -Checked Blood Cx x2 and was + for MRSA, CXR as above, Urinalysis and Urine Cx obtained and Urine Cx pending but U/A was unremarkable -Started Abx on with IV Vancomycin and IV Cefepime but stopped now that MRSA is in blood Cx -Checked PCT and was elevated and went from 1.82 -> 64.33 -> 133.23 -WBC went from 6.8 -> 16.9 -> 5.7 -> 4.9 -Continue to Monitor Cx's and Follow Temperature Carefully -Suspected source of Bacteremia could be inflitrated IV in Right Hand -ID Consulted for further evaluation and recommendations and recommending repeating blood cultures this morning as well as obtain a TTE to start working out consideration for endocarditis and continue with MRSA contact precautions. -TTE done and showed "No evidence of valvular vegetations on this transthoracic echocardiogram. Would recommend a transesophageal echocardiogram  to exclude infective endocarditis if clinically indicated."   -Repeat Blood Cx (NGTD 2 Days) -Per infectious diseases if TTE looks okay and his repeat blood cultures remain with no growth they will likely forego his TEE as endocarditis would seem unlikely and) the case -Anticipate 4 weeks of vancomycin with hemodialysis per ID recc's  Hyperkalemia, improved  -Improved with  dialysis  -Patient's potassium is now improved to 4.1 -Defer to Nephrology to correct but may try some Lokelma  -Continue monitor and trend and repeat CMP in the a.m.  Hyponatremia -Mild. Patient's Na+ is now 131 -Expect to correct in Dialysis that is being done today -Continue to Monitor and Trend -Repeat CMP in the AM   Hypertensive Urgency with likely flash pulmonary edema, improved  -Blood pressure was elevated to systolics in the 798X on admission and then again became elevated on 09/03/2020 he decompensated -Resumed home blood pressure medications including carvedilol 25 mg p.o. twice daily and with irbesartan 150 mg p.o. daily -Continue monitor blood pressures per protocol -Last blood pressure reading was 166/91 -Continue IV hydralazine 5 mg every 4 as needed for systolic blood pressure greater than 180 -Also improving with dialysis  Diarrhea, improved  -Likely viral gastroenteritis-has not been on any recent antibiotics or has any reported fevers -His nausea vomiting and diarrhea has resolved -His GI pathogen panel and C. difficile was negative -Continue to treat supportively with antidiarrheals if necessary  Diabetes Mellitus Type 2 -Recent hemoglobin A1c was 6.4 -Continue with very sensitive NovoLog sliding scale insulin before meals and at bedtime -Continue monitor blood sugars carefully and per protocol; CBGs ranging from 136-210  Hyperlipidemia -Continue with Pravastatin 80 mg p.o. every evening  Anemia of Chronic Kidney Disease -Stable. Patient's Hgb/Hct is now gone from 9.2/27.3 -> 8.8/26.9 -> 8.5/25.7 -> 8.1/24.2 -Nephrology ordering Darbepoetin Alfa 60 mcg weekly wile hospitalized and his first dose was Wednesday, 09/03/2020 -Checked Anemia Panel and showed an iron level of 27, U IBC 186, TIBC of 213, saturation ratio 13%, ferritin level 714, folate of 20.3, and vitamin B12 of 357 -Continue to Monitor for S/Sx of Bleeding; Currently no overt bleeding noted -Repeat  CBC in the AM   Thrombocytopenia -Patient's Platelet Count went from 166 -> 139 -> 138 and again 138 -In the setting of Infection and Bacteremia -Continue to Monitor for S/Sx of Bleeding; Currently no overt bleeding noted -Repeat CBC in the AM   Obesity -Complicates overall prognosis and care -Estimated body mass index is 31.75 kg/m as calculated from the following:   Height as of this encounter: 6' (1.829 m).   Weight as of this encounter: 106.2 kg. -Weight Loss and Dietary Counseling given   DVT prophylaxis: Heparin 5,000 units sq q8h stopped. SCDs; May try Lovenox 30 units sq  Code Status: FULL CODE  Family Communication: No family present at bedside as he is seen in the dialysis unit Disposition Plan: Pending further improvement of his infection and clearance by infectious diseases as well as nephrology  Status is: Inpatient  Remains inpatient appropriate because:Unsafe d/c plan, IV treatments appropriate due to intensity of illness or inability to take PO and Inpatient level of care appropriate due to severity of illness   Dispo: The patient is from: Home              Anticipated d/c is to: Home              Patient currently is not medically stable to d/c.   Difficult to place  patient No  Consultants:   Nephrology  Neurology  PCCM  Infectious Diseases    Procedures: Hemodialysis   EEG  ECHOCARDIOGRAM IMPRESSIONS    1. Left ventricular ejection fraction, by estimation, is 45 to 50%. The  left ventricle has mildly decreased function. The left ventricle  demonstrates global hypokinesis. There is mild left ventricular  hypertrophy. Left ventricular diastolic parameters  are consistent with Grade I diastolic dysfunction (impaired relaxation).  2. Right ventricular systolic function is normal. The right ventricular  size is normal. Tricuspid regurgitation signal is inadequate for assessing  PA pressure.  3. Left atrial size was severely dilated.  4. The  mitral valve is abnormal. There is mild thickening of the mitral  valve leaflet(s). There is mild calcification of the mitral valve  leaflet(s). Trivial mitral valve regurgitation.  5. The aortic valve was not well visualized. Aortic valve regurgitation  is not visualized. Mild to moderate aortic valve sclerosis/calcification  is present, without any evidence of aortic stenosis.  6. Aortic dilatation noted. There is mild dilatation of the aortic root,  measuring 38 mm. There is borderline dilatation of the ascending aorta,  measuring 36 mm.  7. No valvular vegetations visualized.   Comparison(s): Compared to prior TTE in 06/2020, the LVEF appears mildly  reduced 45-50%. LV trabeculations were not well visualzed like they were  on previous study.   Conclusion(s)/Recommendation(s): No evidence of valvular vegetations on  this transthoracic echocardiogram. Would recommend a transesophageal  echocardiogram to exclude infective endocarditis if clinically indicated.   FINDINGS  Left Ventricle: Left ventricular ejection fraction, by estimation, is 45  to 50%. The left ventricle has mildly decreased function. The left  ventricle demonstrates global hypokinesis. The left ventricular internal  cavity size was normal in size. There is  mild left ventricular hypertrophy. Left ventricular diastolic parameters  are consistent with Grade I diastolic dysfunction (impaired relaxation).   Right Ventricle: The right ventricular size is normal. Right vetricular  wall thickness was not well visualized. Right ventricular systolic  function is normal. Tricuspid regurgitation signal is inadequate for  assessing PA pressure.   Left Atrium: Left atrial size was severely dilated.   Right Atrium: Right atrial size was normal in size.   Pericardium: There is no evidence of pericardial effusion.   Mitral Valve: The mitral valve is abnormal. There is mild thickening of  the mitral valve leaflet(s).  There is mild calcification of the mitral  valve leaflet(s). Mild to moderate mitral annular calcification. Trivial  mitral valve regurgitation. MV peak  gradient, 4.4 mmHg. The mean mitral valve gradient is 2.0 mmHg.   Tricuspid Valve: The tricuspid valve is normal in structure. Tricuspid  valve regurgitation is trivial.   Aortic Valve: The aortic valve was not well visualized. Aortic valve  regurgitation is not visualized. Mild to moderate aortic valve  sclerosis/calcification is present, without any evidence of aortic  stenosis. Aortic valve mean gradient measures 4.0 mmHg.  Aortic valve peak gradient measures 7.1 mmHg. Aortic valve area, by VTI  measures 3.57 cm.   Pulmonic Valve: The pulmonic valve was normal in structure. Pulmonic valve  regurgitation is trivial.   Aorta: The aortic root is normal in size and structure and aortic  dilatation noted. There is mild dilatation of the aortic root, measuring  38 mm. There is borderline dilatation of the ascending aorta, measuring 36  mm.   IAS/Shunts: No atrial level shunt detected by color flow Doppler.     LEFT VENTRICLE  PLAX 2D  LVIDd:     4.20 cm   Diastology  LVIDs:     3.80 cm   LV e' medial:  5.66 cm/s  LV PW:     1.70 cm   LV E/e' medial: 14.3  LV IVS:    1.40 cm   LV e' lateral:  11.50 cm/s  LVOT diam:   2.40 cm   LV E/e' lateral: 7.0  LV SV:     76  LV SV Index:  33  LVOT Area:   4.52 cm    LV Volumes (MOD)  LV vol d, MOD A2C: 169.0 ml  LV vol d, MOD A4C: 212.0 ml  LV vol s, MOD A2C: 96.3 ml  LV vol s, MOD A4C: 119.0 ml  LV SV MOD A2C:   72.7 ml  LV SV MOD A4C:   212.0 ml  LV SV MOD BP:   83.4 ml   RIGHT VENTRICLE  RV Basal diam: 3.20 cm  RV S prime:   13.20 cm/s  TAPSE (M-mode): 2.6 cm   LEFT ATRIUM       Index    RIGHT ATRIUM      Index  LA diam:    4.30 cm 1.89 cm/m RA Area:   20.30 cm  LA Vol (A2C):  132.0 ml 57.94  ml/m RA Volume:  59.60 ml 26.16 ml/m  LA Vol (A4C):  102.0 ml 44.77 ml/m  LA Biplane Vol: 118.0 ml 51.80 ml/m  AORTIC VALVE  AV Area (Vmax):  3.24 cm  AV Area (Vmean):  3.27 cm  AV Area (VTI):   3.57 cm  AV Vmax:      133.00 cm/s  AV Vmean:     90.700 cm/s  AV VTI:      0.213 m  AV Peak Grad:   7.1 mmHg  AV Mean Grad:   4.0 mmHg  LVOT Vmax:     95.30 cm/s  LVOT Vmean:    65.500 cm/s  LVOT VTI:     0.168 m  LVOT/AV VTI ratio: 0.79    AORTA  Ao Root diam: 3.90 cm  Ao Asc diam: 3.60 cm   MITRAL VALVE  MV Area (PHT): 3.30 cm  SHUNTS  MV Area VTI:  2.95 cm  Systemic VTI: 0.17 m  MV Peak grad: 4.4 mmHg  Systemic Diam: 2.40 cm  MV Mean grad: 2.0 mmHg  MV Vmax:    1.05 m/s  MV Vmean:   65.9 cm/s  MV Decel Time: 230 msec  MV E velocity: 81.00 cm/s  MV A velocity: 85.30 cm/s  MV E/A ratio: 0.95   Antimicrobials:  Anti-infectives (From admission, onward)   Start     Dose/Rate Route Frequency Ordered Stop   09/08/20 1200  vancomycin (VANCOCIN) IVPB 1000 mg/200 mL premix        1,000 mg 200 mL/hr over 60 Minutes Intravenous Every M-W-F (Hemodialysis) 09/05/20 1227     09/05/20 1300  vancomycin (VANCOREADY) IVPB 1000 mg/200 mL        1,000 mg 200 mL/hr over 60 Minutes Intravenous  Once 09/05/20 1227 09/05/20 1601   09/04/20 1800  ceFEPIme (MAXIPIME) 1 g in sodium chloride 0.9 % 100 mL IVPB  Status:  Discontinued        1 g 200 mL/hr over 30 Minutes Intravenous Every 24 hours 09/03/20 1704 09/04/20 1412   09/04/20 1115  vancomycin (VANCOREADY) IVPB 1000 mg/200 mL        1,000 mg 200 mL/hr over 60  Minutes Intravenous  Once 09/04/20 1016 09/04/20 1311   09/03/20 1703  vancomycin variable dose per unstable renal function (pharmacist dosing)  Status:  Discontinued         Does not apply See admin instructions 09/03/20 1704 09/05/20 1227   09/03/20 1700  vancomycin (VANCOCIN) 2,500 mg in sodium chloride 0.9 % 500 mL  IVPB        2,500 mg 250 mL/hr over 120 Minutes Intravenous  Once 09/03/20 1656 09/03/20 2045   09/03/20 1700  ceFEPIme (MAXIPIME) 2 g in sodium chloride 0.9 % 100 mL IVPB        2 g 200 mL/hr over 30 Minutes Intravenous  Once 09/03/20 1656 09/03/20 1900        Subjective: Seen and examined at the bedside and felt better today.  No nausea or vomiting.  States he slept very well last night.  No other concerns or complaints at this time and mentation is much improved back to his baseline.  Happy that his repeat blood cultures are negative and showing no growth to date so far.  Next dialysis session is tomorrow morning and hoping to be discharged after infectious disease clears the patient for discharge.  Objective: Vitals:   09/06/20 1628 09/06/20 2124 09/07/20 0539 09/07/20 0858  BP: (!) 110/94 136/72 (!) 144/73 (!) 166/91  Pulse: 68 70 67 73  Resp: 18 16 18 18   Temp: 98.1 F (36.7 C) 98.5 F (36.9 C) 99.2 F (37.3 C) 97.7 F (36.5 C)  TempSrc: Oral Oral Oral Oral  SpO2: 99% 97% 100% 98%  Weight:      Height:        Intake/Output Summary (Last 24 hours) at 09/07/2020 9604 Last data filed at 09/07/2020 0600 Gross per 24 hour  Intake 840 ml  Output 0 ml  Net 840 ml   Filed Weights   09/04/20 2133 09/05/20 0803 09/05/20 1137  Weight: 110.7 kg 109.3 kg 106.2 kg   Examination: Physical Exam:  Constitutional: WN/WD obese Caucasian male in NAD and appears calm and comfortable Eyes: Lids and conjunctivae normal, sclerae anicteric  ENMT: External Ears, Nose appear normal. Grossly normal hearing.  Neck: Appears normal, supple, no cervical masses, normal ROM, no appreciable thyromegaly; no JVD Respiratory: Diminished to auscultation bilaterally, no wheezing, rales, rhonchi or crackles. Normal respiratory effort and patient is not tachypenic. No accessory muscle use. Unlabored breathing and not wearing Supplemental O2 via [place] Cardiovascular: RRR, no murmurs / rubs / gallops. S1 and  S2 auscultated. Trace LE edema Abdomen: Soft, non-tender, Distended 2/2 body habitus. Bowel sounds positive.  GU: Deferred. Musculoskeletal: No clubbing / cyanosis of digits/nails. No joint deformity upper and lower extremities. Has a Left forearm AVF Skin: No rashes, lesions, ulcers on a limited skin evaluation. No induration; Warm and dry.  Neurologic: CN 2-12 grossly intact with no focal deficits. Romberg sign and cerebellar reflexes not assessed.  Psychiatric: Normal judgment and insight. Alert and oriented x 3. Normal mood and appropriate affect.   Data Reviewed: I have personally reviewed following labs and imaging studies  CBC: Recent Labs  Lab 09/03/20 0938 09/04/20 0153 09/05/20 0559 09/06/20 0146 09/07/20 0146  WBC 6.8 16.9* 5.7 4.9 4.2  NEUTROABS 5.4 16.0* 5.0 3.4 2.6  HGB 9.2* 9.2* 8.8* 8.5* 8.1*  HCT 27.1* 27.3* 26.9* 25.7* 24.2*  MCV 90.9 91.0 91.5 92.1 89.6  PLT 230 166 139* 138* 540*   Basic Metabolic Panel: Recent Labs  Lab 09/03/20 0938 09/04/20 0153 09/04/20 0827  09/05/20 0559 09/06/20 0146 09/07/20 0146  NA 132* 133* 132* 129* 131* 131*  K 4.7 5.2* 4.2 4.4 3.9 4.1  CL 97* 98 95* 95* 94* 94*  CO2 21* 21* 24 23 25 24   GLUCOSE 154* 189* 156* 150* 144* 191*  BUN 76* 75* 42* 67* 45* 64*  CREATININE 12.66* 12.99* 8.27* 11.02* 8.18* 10.57*  CALCIUM 8.5* 8.5* 8.7* 8.7* 8.7* 8.4*  MG 2.3 2.0  --  2.5* 2.2 2.4  PHOS 9.2* 8.3*  --  7.1* 5.6* 5.6*   GFR: Estimated Creatinine Clearance: 9.6 mL/min (A) (by C-G formula based on SCr of 10.57 mg/dL (H)). Liver Function Tests: Recent Labs  Lab 09/03/20 0938 09/04/20 0153 09/05/20 0559 09/06/20 0146 09/07/20 0146  AST 11* 9* 11* 14* 14*  ALT 12 11 12 14 15   ALKPHOS 50 47 46 48 48  BILITOT 0.7 1.0 0.7 0.9 0.4  PROT 6.3* 6.3* 5.9* 5.8* 5.6*  ALBUMIN 3.0* 2.8* 2.6* 2.5* 2.5*   No results for input(s): LIPASE, AMYLASE in the last 168 hours. Recent Labs  Lab 08/31/20 2341  AMMONIA 16   Coagulation  Profile: No results for input(s): INR, PROTIME in the last 168 hours. Cardiac Enzymes: No results for input(s): CKTOTAL, CKMB, CKMBINDEX, TROPONINI in the last 168 hours. BNP (last 3 results) No results for input(s): PROBNP in the last 8760 hours. HbA1C: No results for input(s): HGBA1C in the last 72 hours. CBG: Recent Labs  Lab 09/06/20 0638 09/06/20 1235 09/06/20 1628 09/06/20 2124 09/07/20 0651  GLUCAP 138* 136* 168* 210* 147*   Lipid Profile: No results for input(s): CHOL, HDL, LDLCALC, TRIG, CHOLHDL, LDLDIRECT in the last 72 hours. Thyroid Function Tests: No results for input(s): TSH, T4TOTAL, FREET4, T3FREE, THYROIDAB in the last 72 hours. Anemia Panel: No results for input(s): VITAMINB12, FOLATE, FERRITIN, TIBC, IRON, RETICCTPCT in the last 72 hours. Sepsis Labs: Recent Labs  Lab 09/03/20 1816 09/04/20 0153 09/05/20 0559  PROCALCITON 1.82 64.33 133.23    Recent Results (from the past 240 hour(s))  Resp Panel by RT-PCR (Flu A&B, Covid) Nasopharyngeal Swab     Status: None   Collection Time: 08/31/20 11:43 PM   Specimen: Nasopharyngeal Swab; Nasopharyngeal(NP) swabs in vial transport medium  Result Value Ref Range Status   SARS Coronavirus 2 by RT PCR NEGATIVE NEGATIVE Final    Comment: (NOTE) SARS-CoV-2 target nucleic acids are NOT DETECTED.  The SARS-CoV-2 RNA is generally detectable in upper respiratory specimens during the acute phase of infection. The lowest concentration of SARS-CoV-2 viral copies this assay can detect is 138 copies/mL. A negative result does not preclude SARS-Cov-2 infection and should not be used as the sole basis for treatment or other patient management decisions. A negative result may occur with  improper specimen collection/handling, submission of specimen other than nasopharyngeal swab, presence of viral mutation(s) within the areas targeted by this assay, and inadequate number of viral copies(<138 copies/mL). A negative result  must be combined with clinical observations, patient history, and epidemiological information. The expected result is Negative.  Fact Sheet for Patients:  EntrepreneurPulse.com.au  Fact Sheet for Healthcare Providers:  IncredibleEmployment.be  This test is no t yet approved or cleared by the [place] FDA and  has been authorized for detection and/or diagnosis of SARS-CoV-2 by FDA under an Emergency Use Authorization (EUA). This EUA will remain  in effect (meaning this test can be used) for the duration of the COVID-19 declaration under Section 564(b)(1) of the Act, 21 U.S.C.section 360bbb-3(b)(1), unless  the authorization is terminated  or revoked sooner.       Influenza A by PCR NEGATIVE NEGATIVE Final   Influenza B by PCR NEGATIVE NEGATIVE Final    Comment: (NOTE) The Xpert Xpress SARS-CoV-2/FLU/RSV plus assay is intended as an aid in the diagnosis of influenza from Nasopharyngeal swab specimens and should not be used as a sole basis for treatment. Nasal washings and aspirates are unacceptable for Xpert Xpress SARS-CoV-2/FLU/RSV testing.  Fact Sheet for Patients: EntrepreneurPulse.com.au  Fact Sheet for Healthcare Providers: [email or web address]  This test is not yet approved or cleared by the [place] FDA and has been authorized for detection and/or diagnosis of SARS-CoV-2 by FDA under an Emergency Use Authorization (EUA). This EUA will remain in effect (meaning this test can be used) for the duration of the COVID-19 declaration under Section 564(b)(1) of the Act, 21 U.S.C. section 360bbb-3(b)(1), unless the authorization is terminated or revoked.  Performed at [facility], [place] [address], [place], [place] [address]   Gastrointestinal Panel by PCR , Stool     Status: None   Collection Time: 09/01/20  4:05 AM   Specimen: Stool  Result Value Ref Range Status   Campylobacter  species NOT DETECTED NOT DETECTED Final   Plesimonas shigelloides NOT DETECTED NOT DETECTED Final   Salmonella species NOT DETECTED NOT DETECTED Final   Yersinia enterocolitica NOT DETECTED NOT DETECTED Final   Vibrio species NOT DETECTED NOT DETECTED Final   Vibrio cholerae NOT DETECTED NOT DETECTED Final   Enteroaggregative E coli (EAEC) NOT DETECTED NOT DETECTED Final   Enteropathogenic E coli (EPEC) NOT DETECTED NOT DETECTED Final   Enterotoxigenic E coli (ETEC) NOT DETECTED NOT DETECTED Final   Shiga like toxin producing E coli (STEC) NOT DETECTED NOT DETECTED Final   Shigella/Enteroinvasive E coli (EIEC) NOT DETECTED NOT DETECTED Final   Cryptosporidium NOT DETECTED NOT DETECTED Final   Cyclospora cayetanensis NOT DETECTED NOT DETECTED Final   Entamoeba histolytica NOT DETECTED NOT DETECTED Final   Giardia lamblia NOT DETECTED NOT DETECTED Final   Adenovirus F40/41 NOT DETECTED NOT DETECTED Final   Astrovirus NOT DETECTED NOT DETECTED Final   Norovirus GI/GII NOT DETECTED NOT DETECTED Final   Rotavirus A NOT DETECTED NOT DETECTED Final   Sapovirus (I, II, IV, and V) NOT DETECTED NOT DETECTED Final    Comment: Performed at [facility], [place] [place], [place] [address]  C Difficile Quick Screen w PCR reflex     Status: None   Collection Time: 09/01/20 12:03 PM   Specimen: STOOL  Result Value Ref Range Status   C Diff antigen NEGATIVE NEGATIVE Final   C Diff toxin NEGATIVE NEGATIVE Final   C Diff interpretation No C. difficile detected.  Final    Comment: Performed at [facility], [place] [address] [place], [place] [address]  MRSA PCR Screening     Status: Abnormal   Collection Time: 09/01/20 12:15 PM   Specimen: Nasopharyngeal  Result Value Ref Range Status   MRSA by PCR POSITIVE (A) NEGATIVE Final    Comment:        The GeneXpert MRSA Assay (FDA approved for NASAL specimens only), is one component of a comprehensive MRSA colonization surveillance  program. It is not intended to diagnose MRSA infection nor to guide or monitor treatment for MRSA infections. RESULT CALLED TO, READ BACK BY AND VERIFIED WITH: A CREWS RN 0623 09/01/20 A BROWNING Performed at [facility], [place]  [address] [place], [place] [address]   Culture, blood (routine x 2)     Status: Abnormal   Collection Time: 09/03/20  6:13 PM   Specimen: BLOOD RIGHT HAND  Result Value Ref Range Status   Specimen Description BLOOD RIGHT HAND  Final   Special Requests   Final    BOTTLES DRAWN AEROBIC ONLY Blood Culture adequate volume   Culture  Setup Time   Final    GRAM POSITIVE COCCI IN CLUSTERS AEROBIC BOTTLE ONLY CRITICAL RESULT CALLED TO, READ BACK BY AND VERIFIED WITH: PHARMD MADISON YATES AT 2025 ON 09/04/20 BY KJ Performed at [facility], [place] [address], [place], [place] [address]    Culture METHICILLIN RESISTANT STAPHYLOCOCCUS AUREUS (A)  Final   Report Status 09/06/2020 FINAL  Final   Organism ID, Bacteria METHICILLIN RESISTANT STAPHYLOCOCCUS AUREUS  Final      Susceptibility   Methicillin resistant staphylococcus aureus - MIC*    CIPROFLOXACIN >=8 RESISTANT Resistant     ERYTHROMYCIN <=0.25 SENSITIVE Sensitive     GENTAMICIN <=0.5 SENSITIVE Sensitive     OXACILLIN >=4 RESISTANT Resistant     TETRACYCLINE <=1 SENSITIVE Sensitive     VANCOMYCIN <=0.5 SENSITIVE Sensitive     TRIMETH/SULFA <=10 SENSITIVE Sensitive     CLINDAMYCIN <=0.25 SENSITIVE Sensitive     RIFAMPIN <=0.5 SENSITIVE Sensitive     Inducible Clindamycin NEGATIVE Sensitive     * METHICILLIN RESISTANT STAPHYLOCOCCUS AUREUS  Blood Culture ID Panel (Reflexed)     Status: Abnormal   Collection Time: 09/03/20  6:13 PM  Result Value Ref Range Status   Enterococcus faecalis NOT DETECTED NOT DETECTED Final   Enterococcus Faecium NOT DETECTED NOT DETECTED Final   Listeria monocytogenes NOT DETECTED NOT DETECTED Final   Staphylococcus species DETECTED (A) NOT DETECTED Final    Comment:  CRITICAL RESULT CALLED TO, READ BACK BY AND VERIFIED WITH: PHARMD MADSION YATES AT 1325 ON 09/04/20 BY KJ    Staphylococcus aureus (BCID) DETECTED (A) NOT DETECTED Final    Comment: Methicillin (oxacillin)-resistant Staphylococcus aureus (MRSA). MRSA is predictably resistant to beta-lactam antibiotics (except ceftaroline). Preferred therapy is vancomycin unless clinically contraindicated. Patient requires contact precautions if  hospitalized. CRITICAL RESULT CALLED TO, READ BACK BY AND VERIFIED WITH: PHARMD MADISON YATES AT 2376 ON 09/04/20 BY KJ    Staphylococcus epidermidis NOT DETECTED NOT DETECTED Final   Staphylococcus lugdunensis NOT DETECTED NOT DETECTED Final   Streptococcus species NOT DETECTED NOT DETECTED Final   Streptococcus agalactiae NOT DETECTED NOT DETECTED Final   Streptococcus pneumoniae NOT DETECTED NOT DETECTED Final   Streptococcus pyogenes NOT DETECTED NOT DETECTED Final   A.calcoaceticus-baumannii NOT DETECTED NOT DETECTED Final   Bacteroides fragilis NOT DETECTED NOT DETECTED Final   Enterobacterales NOT DETECTED NOT DETECTED Final   Enterobacter cloacae complex NOT DETECTED NOT DETECTED Final   Escherichia coli NOT DETECTED NOT DETECTED Final   Klebsiella aerogenes NOT DETECTED NOT DETECTED Final   Klebsiella oxytoca NOT DETECTED NOT DETECTED Final   Klebsiella pneumoniae NOT DETECTED NOT DETECTED Final   Proteus species NOT DETECTED NOT DETECTED Final   Salmonella species NOT DETECTED NOT DETECTED Final   Serratia marcescens NOT DETECTED NOT DETECTED Final   Haemophilus influenzae NOT DETECTED NOT DETECTED Final   Neisseria meningitidis NOT DETECTED NOT DETECTED Final   Pseudomonas aeruginosa NOT DETECTED NOT DETECTED Final   Stenotrophomonas maltophilia NOT DETECTED NOT DETECTED Final   Candida albicans NOT DETECTED NOT DETECTED Final   Candida auris NOT  DETECTED NOT DETECTED Final   Candida glabrata NOT DETECTED NOT DETECTED Final   Candida krusei NOT  DETECTED NOT DETECTED Final   Candida parapsilosis NOT DETECTED NOT DETECTED Final   Candida tropicalis NOT DETECTED NOT DETECTED Final   Cryptococcus neoformans/gattii NOT DETECTED NOT DETECTED Final   Meth resistant mecA/C and MREJ DETECTED (A) NOT DETECTED Final    Comment: CRITICAL RESULT CALLED TO, READ BACK BY AND VERIFIED WITH: PHARMD MADISON YATES AT [place] ON 09/03/20 Performed at [facility], [place] [address], [place], [place] [address]   Culture, blood (routine x 2)     Status: Abnormal   Collection Time: 09/03/20  6:15 PM   Specimen: BLOOD RIGHT HAND  Result Value Ref Range Status   Specimen Description BLOOD RIGHT HAND  Final   Special Requests   Final    BOTTLES DRAWN AEROBIC ONLY Blood Culture adequate volume   Culture  Setup Time GRAM POSITIVE COCCI AEROBIC BOTTLE ONLY   Final   Culture (A)  Final    STAPHYLOCOCCUS AUREUS SUSCEPTIBILITIES PERFORMED ON PREVIOUS CULTURE WITHIN THE LAST 5 DAYS. Performed at [facility], [place] [address] [place], [place] [address]    Report Status 09/06/2020 FINAL  Final  Culture, Urine     Status: Abnormal   Collection Time: 09/03/20  9:39 PM   Specimen: Urine, Random  Result Value Ref Range Status   Specimen Description URINE, RANDOM  Final   Special Requests NONE  Final   Culture (A)  Final    <10,000 COLONIES/mL INSIGNIFICANT GROWTH Performed at [facility], [place] [address], [place], [place] [address]    Report Status 09/06/2020 FINAL  Final  Culture, blood (routine x 2)     Status: None (Preliminary result)   Collection Time: 09/05/20  5:59 AM   Specimen: BLOOD  Result Value Ref Range Status   Specimen Description BLOOD RIGHT ANTECUBITAL  Final   Special Requests   Final    BOTTLES DRAWN AEROBIC AND ANAEROBIC Blood Culture adequate volume   Culture   Final    NO GROWTH 2 DAYS Performed at [facility], [place] [address], [place], [place] [address]    Report Status PENDING  Incomplete  Culture,  blood (routine x 2)     Status: None (Preliminary result)   Collection Time: 09/05/20  6:02 AM   Specimen: BLOOD  Result Value Ref Range Status   Specimen Description BLOOD BLOOD RIGHT HAND  Final   Special Requests AEROBIC BOTTLE ONLY Blood Culture adequate volume  Final   Culture   Final    NO GROWTH 2 DAYS Performed at [facility], [place] [address], [place], [place] [address]    Report Status PENDING  Incomplete     RN Pressure Injury Documentation:     Estimated body mass index is 31.75 kg/m as calculated from the following:   Height as of this encounter: 6' (1.829 m).   Weight as of this encounter: 106.2 kg.  Malnutrition Type:   Malnutrition Characteristics:   Nutrition Interventions:    Radiology Studies: ECHOCARDIOGRAM COMPLETE  Result Date: 09/05/2020    ECHOCARDIOGRAM REPORT   Patient Name:   HILARY PUNDT Durell Date of Exam: 09/05/2020 Medical Rec #:  [id number]       Height:       72.0 in Accession #:    [id number]      Weight:       234.1  lb Date of Birth:  11/25/1960       BSA:          2.278 m Patient Age:    60 years        BP:           124/56 mmHg Patient Gender: M               HR:           84 bpm. Exam Location:  Inpatient Procedure: 2D Echo, Cardiac Doppler and Color Doppler Indications:    Bacteremia  History:        Patient has prior history of Echocardiogram examinations, most                 recent 06/17/2020. Risk Factors:Diabetes and Hypertension. ESRD.                 MRSA bacteremia.  Sonographer:    Cl[place]ton Lefort RDCS (AE) Referring Phys: [id number] [place]  Sonographer Comments: No subcostal window and suboptimal parasternal window. IMPRESSIONS  1. Left ventricular ejection fraction, by estimation, is 45 to 50%. The left ventricle has mildly decreased function. The left ventricle demonstrates global hypokinesis. There is mild left ventricular hypertrophy. Left ventricular diastolic parameters are consistent with Grade I diastolic dysfunction (impaired  relaxation).  2. Right ventricular systolic function is normal. The right ventricular size is normal. Tricuspid regurgitation signal is inadequate for assessing PA pressure.  3. Left atrial size was severely dilated.  4. The mitral valve is abnormal. There is mild thickening of the mitral valve leaflet(s). There is mild calcification of the mitral valve leaflet(s). Trivial mitral valve regurgitation.  5. The aortic valve was not well visualized. Aortic valve regurgitation is not visualized. Mild to moderate aortic valve sclerosis/calcification is present, without any evidence of aortic stenosis.  6. Aortic dilatation noted. There is mild dilatation of the aortic root, measuring 38 mm. There is borderline dilatation of the ascending aorta, measuring 36 mm.  7. No valvular vegetations visualized. Comparison(s): Compared to prior TTE in 06/2020, the LVEF appears mildly reduced 45-50%. LV trabeculations were not well visualzed like they were on previous study. Conclusion(s)/Recommendation(s): No evidence of valvular vegetations on this transthoracic echocardiogram. Would recommend a transesophageal echocardiogram to exclude infective endocarditis if clinically indicated. FINDINGS  Left Ventricle: Left ventricular ejection fraction, by estimation, is 45 to 50%. The left ventricle has mildly decreased function. The left ventricle demonstrates global hypokinesis. The left ventricular internal cavity size was normal in size. There is  mild left ventricular hypertrophy. Left ventricular diastolic parameters are consistent with Grade I diastolic dysfunction (impaired relaxation). Right Ventricle: The right ventricular size is normal. Right vetricular wall thickness was not well visualized. Right ventricular systolic function is normal. Tricuspid regurgitation signal is inadequate for assessing PA pressure. Left Atrium: Left atrial size was severely dilated. Right Atrium: Right atrial size was normal in size. Pericardium:  There is no evidence of pericardial effusion. Mitral Valve: The mitral valve is abnormal. There is mild thickening of the mitral valve leaflet(s). There is mild calcification of the mitral valve leaflet(s). Mild to moderate mitral annular calcification. Trivial mitral valve regurgitation. MV peak gradient, 4.4 mmHg. The mean mitral valve gradient is 2.0 mmHg. Tricuspid Valve: The tricuspid valve is normal in structure. Tricuspid valve regurgitation is trivial. Aortic Valve: The aortic valve was not well visualized. Aortic valve regurgitation is not visualized. Mild to moderate aortic valve sclerosis/calcification is present, without any evidence of aortic  stenosis. Aortic valve mean gradient measures 4.0 mmHg.  Aortic valve peak gradient measures 7.1 mmHg. Aortic valve area, by VTI measures 3.57 cm. Pulmonic Valve: The pulmonic valve was normal in structure. Pulmonic valve regurgitation is trivial. Aorta: The aortic root is normal in size and structure and aortic dilatation noted. There is mild dilatation of the aortic root, measuring 38 mm. There is borderline dilatation of the ascending aorta, measuring 36 mm. IAS/Shunts: No atrial level shunt detected by color flow Doppler.  LEFT VENTRICLE PLAX 2D LVIDd:         4.20 cm      Diastology LVIDs:         3.80 cm      LV e' medial:    5.66 cm/s LV PW:         1.70 cm      LV E/e' medial:  14.3 LV IVS:        1.40 cm      LV e' lateral:   11.50 cm/s LVOT diam:     2.40 cm      LV E/e' lateral: 7.0 LV SV:         76 LV SV Index:   33 LVOT Area:     4.52 cm  LV Volumes (MOD) LV vol d, MOD A2C: 169.0 ml LV vol d, MOD A4C: 212.0 ml LV vol s, MOD A2C: 96.3 ml LV vol s, MOD A4C: 119.0 ml LV SV MOD A2C:     72.7 ml LV SV MOD A4C:     212.0 ml LV SV MOD BP:      83.4 ml RIGHT VENTRICLE RV Basal diam:  3.20 cm RV S prime:     13.20 cm/s TAPSE (M-mode): 2.6 cm LEFT ATRIUM              Index       RIGHT ATRIUM           Index LA diam:        4.30 cm  1.89 cm/m  RA Area:      20.30 cm LA Vol (A2C):   132.0 ml 57.94 ml/m RA Volume:   59.60 ml  26.16 ml/m LA Vol (A4C):   102.0 ml 44.77 ml/m LA Biplane Vol: 118.0 ml 51.80 ml/m  AORTIC VALVE AV Area (Vmax):    3.24 cm AV Area (Vmean):   3.27 cm AV Area (VTI):     3.57 cm AV Vmax:           133.00 cm/s AV Vmean:          90.700 cm/s AV VTI:            0.213 m AV Peak Grad:      7.1 mmHg AV Mean Grad:      4.0 mmHg LVOT Vmax:         95.30 cm/s LVOT Vmean:        65.500 cm/s LVOT VTI:          0.168 m LVOT/AV VTI ratio: 0.79  AORTA Ao Root diam: 3.90 cm Ao Asc diam:  3.60 cm MITRAL VALVE MV Area (PHT): 3.30 cm    SHUNTS MV Area VTI:   2.95 cm    Systemic VTI:  0.17 m MV Peak grad:  4.4 mmHg    Systemic Diam: 2.40 cm MV Mean grad:  2.0 mmHg MV Vmax:       1.05 m/s MV Vmean:      65.9 cm/s  MV Decel Time: 230 msec MV E velocity: 81.00 cm/s MV A velocity: 85.30 cm/s MV E/A ratio:  0.95 Gwyndolyn Kaufman MD Electronically signed by Gwyndolyn Kaufman MD Signature Date/Time: 09/05/2020/6:33:08 PM    Final    Scheduled Meds: . calcitRIOL  2.25 mcg Oral Q M,W,F-HD  . carvedilol  25 mg Oral BID WC  . Chlorhexidine Gluconate Cloth  6 each Topical Q0600  . darbepoetin (ARANESP) injection - DIALYSIS  60 mcg Intravenous Q Wed-HD  . ferric citrate  630 mg Oral TID WC  . insulin aspart  0-5 Units Subcutaneous QHS  . insulin aspart  0-6 Units Subcutaneous TID WC  . irbesartan  150 mg Oral Daily  . pantoprazole  40 mg Oral Q1200  . pravastatin  80 mg Oral QPM   Continuous Infusions: . [START ON 09/08/2020] vancomycin      LOS: 5 days   Kerney Elbe, DO [facility] PAGER is on [place]  If 7PM-7AM, please contact night-coverage [email or web address]

## 2020-09-07 NOTE — Progress Notes (Signed)
[facility] Progress Note   Subjective:    Seen in room. No events overnight. No complaints this am. No cp, sob.    Objective Vitals:   09/06/20 0923 09/06/20 1628 09/06/20 2124 09/07/20 0539  BP: 136/78 (!) 110/94 136/72 (!) 144/73  Pulse: 79 68 70 67  Resp: 18 18 16 18   Temp: 98.7 F (37.1 C) 98.1 F (36.7 C) 98.5 F (36.9 C) 99.2 F (37.3 C)  TempSrc: Oral Oral Oral Oral  SpO2: 97% 99% 97% 100%  Weight:      Height:       Physical Exam General: chronicially ill appearing male in NAD, AAOx3 Heart: RRR, no mrg Lungs: CTAB anterolaterally, nml WOB  Abdomen: soft, NTND Extremities: trace edema, +chronic venous stasis /skin changes.  Dialysis Access: LU AVF +bruiut   Filed Weights   09/04/20 2133 09/05/20 0803 09/05/20 1137  Weight: 110.7 kg 109.3 kg 106.2 kg    Intake/Output Summary (Last 24 hours) at 09/07/2020 0820 Last data filed at 09/07/2020 0600 Gross per 24 hour  Intake 840 ml  Output 0 ml  Net 840 ml    Additional Objective Labs: Basic Metabolic Panel: Recent Labs  Lab 09/05/20 0559 09/06/20 0146 09/07/20 0146  NA 129* 131* 131*  K 4.4 3.9 4.1  CL 95* 94* 94*  CO2 23 25 24   GLUCOSE 150* 144* 191*  BUN 67* 45* 64*  CREATININE 11.02* 8.18* 10.57*  CALCIUM 8.7* 8.7* 8.4*  PHOS 7.1* 5.6* 5.6*   Liver Function Tests: Recent Labs  Lab 09/05/20 0559 09/06/20 0146 09/07/20 0146  AST 11* 14* 14*  ALT 12 14 15   ALKPHOS 46 48 48  BILITOT 0.7 0.9 0.4  PROT 5.9* 5.8* 5.6*  ALBUMIN 2.6* 2.5* 2.5*   CBC: Recent Labs  Lab 09/03/20 0938 09/04/20 0153 09/05/20 0559 09/06/20 0146 09/07/20 0146  WBC 6.8 16.9* 5.7 4.9 4.2  NEUTROABS 5.4 16.0* 5.0 3.4 2.6  HGB 9.2* 9.2* 8.8* 8.5* 8.1*  HCT 27.1* 27.3* 26.9* 25.7* 24.2*  MCV 90.9 91.0 91.5 92.1 89.6  PLT 230 166 139* 138* 138*   Blood Culture    Component Value Date/Time   SDES URINE, RANDOM 09/03/2020 2139   SPECREQUEST NONE 09/03/2020 2139   CULT (A) 09/03/2020 2139     <10,000 COLONIES/mL INSIGNIFICANT GROWTH Performed at [facility], [place] [address] [place],  [address]    REPTSTATUS 09/06/2020 FINAL 09/03/2020 2139    CBG: Recent Labs  Lab 09/06/20 0638 09/06/20 1235 09/06/20 1628 09/06/20 2124 09/07/20 0651  GLUCAP 138* 136* 168* 210* 147*   Iron Studies:  No results for input(s): IRON, TIBC, TRANSFERRIN, FERRITIN in the last 72 hours. Lab Results  Component Value Date   INR 1.2 06/16/2020   Studies/Results: ECHOCARDIOGRAM COMPLETE  Result Date: 09/05/2020    ECHOCARDIOGRAM REPORT   Patient Name:   Scott Castillo Date of Exam: 09/05/2020 Medical Rec #:  [id number]       Height:       72.0 in Accession #:    [id number]      Weight:       234.1 lb Date of Birth:  Sep 03, 1960       BSA:          2.278 m Patient Age:    61 years        BP:           124/56 mmHg Patient Gender: M  HR:           84 bpm. Exam Location:  Inpatient Procedure: 2D Echo, Cardiac Doppler and Color Doppler Indications:    Bacteremia  History:        Patient has prior history of Echocardiogram examinations, most                 recent 06/17/2020. Risk Factors:Diabetes and Hypertension. ESRD.                 MRSA bacteremia.  Sonographer:    Clayton Lefort RDCS (AE) Referring Phys: [id number] [place]  Sonographer Comments: No subcostal window and suboptimal parasternal window. IMPRESSIONS  1. Left ventricular ejection fraction, by estimation, is 45 to 50%. The left ventricle has mildly decreased function. The left ventricle demonstrates global hypokinesis. There is mild left ventricular hypertrophy. Left ventricular diastolic parameters are consistent with Grade I diastolic dysfunction (impaired relaxation).  2. Right ventricular systolic function is normal. The right ventricular size is normal. Tricuspid regurgitation signal is inadequate for assessing PA pressure.  3. Left atrial size was severely dilated.  4. The mitral valve is abnormal. There is mild  thickening of the mitral valve leaflet(s). There is mild calcification of the mitral valve leaflet(s). Trivial mitral valve regurgitation.  5. The aortic valve was not well visualized. Aortic valve regurgitation is not visualized. Mild to moderate aortic valve sclerosis/calcification is present, without any evidence of aortic stenosis.  6. Aortic dilatation noted. There is mild dilatation of the aortic root, measuring 38 mm. There is borderline dilatation of the ascending aorta, measuring 36 mm.  7. No valvular vegetations visualized. Comparison(s): Compared to prior TTE in 06/2020, the LVEF appears mildly reduced 45-50%. LV trabeculations were not well visualzed like they were on previous study. Conclusion(s)/Recommendation(s): No evidence of valvular vegetations on this transthoracic echocardiogram. Would recommend a transesophageal echocardiogram to exclude infective endocarditis if clinically indicated. FINDINGS  Left Ventricle: Left ventricular ejection fraction, by estimation, is 45 to 50%. The left ventricle has mildly decreased function. The left ventricle demonstrates global hypokinesis. The left ventricular internal cavity size was normal in size. There is  mild left ventricular hypertrophy. Left ventricular diastolic parameters are consistent with Grade I diastolic dysfunction (impaired relaxation). Right Ventricle: The right ventricular size is normal. Right vetricular wall thickness was not well visualized. Right ventricular systolic function is normal. Tricuspid regurgitation signal is inadequate for assessing PA pressure. Left Atrium: Left atrial size was severely dilated. Right Atrium: Right atrial size was normal in size. Pericardium: There is no evidence of pericardial effusion. Mitral Valve: The mitral valve is abnormal. There is mild thickening of the mitral valve leaflet(s). There is mild calcification of the mitral valve leaflet(s). Mild to moderate mitral annular calcification. Trivial mitral  valve regurgitation. MV peak gradient, 4.4 mmHg. The mean mitral valve gradient is 2.0 mmHg. Tricuspid Valve: The tricuspid valve is normal in structure. Tricuspid valve regurgitation is trivial. Aortic Valve: The aortic valve was not well visualized. Aortic valve regurgitation is not visualized. Mild to moderate aortic valve sclerosis/calcification is present, without any evidence of aortic stenosis. Aortic valve mean gradient measures 4.0 mmHg.  Aortic valve peak gradient measures 7.1 mmHg. Aortic valve area, by VTI measures 3.57 cm. Pulmonic Valve: The pulmonic valve was normal in structure. Pulmonic valve regurgitation is trivial. Aorta: The aortic root is normal in size and structure and aortic dilatation noted. There is mild dilatation of the aortic root, measuring 38 mm. There is borderline dilatation of  the ascending aorta, measuring 36 mm. IAS/Shunts: No atrial level shunt detected by color flow Doppler.  LEFT VENTRICLE PLAX 2D LVIDd:         4.20 cm      Diastology LVIDs:         3.80 cm      LV e' medial:    5.66 cm/s LV PW:         1.70 cm      LV E/e' medial:  14.3 LV IVS:        1.40 cm      LV e' lateral:   11.50 cm/s LVOT diam:     2.40 cm      LV E/e' lateral: 7.0 LV SV:         76 LV SV Index:   33 LVOT Area:     4.52 cm  LV Volumes (MOD) LV vol d, MOD A2C: 169.0 ml LV vol d, MOD A4C: 212.0 ml LV vol s, MOD A2C: 96.3 ml LV vol s, MOD A4C: 119.0 ml LV SV MOD A2C:     72.7 ml LV SV MOD A4C:     212.0 ml LV SV MOD BP:      83.4 ml RIGHT VENTRICLE RV Basal diam:  3.20 cm RV S prime:     13.20 cm/s TAPSE (M-mode): 2.6 cm LEFT ATRIUM              Index       RIGHT ATRIUM           Index LA diam:        4.30 cm  1.89 cm/m  RA Area:     20.30 cm LA Vol (A2C):   132.0 ml 57.94 ml/m RA Volume:   59.60 ml  26.16 ml/m LA Vol (A4C):   102.0 ml 44.77 ml/m LA Biplane Vol: 118.0 ml 51.80 ml/m  AORTIC VALVE AV Area (Vmax):    3.24 cm AV Area (Vmean):   3.27 cm AV Area (VTI):     3.57 cm AV Vmax:            133.00 cm/s AV Vmean:          90.700 cm/s AV VTI:            0.213 m AV Peak Grad:      7.1 mmHg AV Mean Grad:      4.0 mmHg LVOT Vmax:         95.30 cm/s LVOT Vmean:        65.500 cm/s LVOT VTI:          0.168 m LVOT/AV VTI ratio: 0.79  AORTA Ao Root diam: 3.90 cm Ao Asc diam:  3.60 cm MITRAL VALVE MV Area (PHT): 3.30 cm    SHUNTS MV Area VTI:   2.95 cm    Systemic VTI:  0.17 m MV Peak grad:  4.4 mmHg    Systemic Diam: 2.40 cm MV Mean grad:  2.0 mmHg MV Vmax:       1.05 m/s MV Vmean:      65.9 cm/s MV Decel Time: 230 msec MV E velocity: 81.00 cm/s MV A velocity: 85.30 cm/s MV E/A ratio:  0.95 Gwyndolyn Kaufman MD Electronically signed by Gwyndolyn Kaufman MD Signature Date/Time: 09/05/2020/6:33:08 PM    Final     Medications: . [START ON 09/08/2020] vancomycin     . calcitRIOL  2.25 mcg Oral Q M,W,F-HD  . carvedilol  25 mg Oral BID WC  .  Chlorhexidine Gluconate Cloth  6 each Topical Q0600  . darbepoetin (ARANESP) injection - DIALYSIS  60 mcg Intravenous Q Wed-HD  . ferric citrate  630 mg Oral TID WC  . insulin aspart  0-5 Units Subcutaneous QHS  . insulin aspart  0-6 Units Subcutaneous TID WC  . irbesartan  150 mg Oral Daily  . pantoprazole  40 mg Oral Q1200  . pravastatin  80 mg Oral QPM    Dialysis Orders: 4h 75min   MWF 400/500  111.5kg  2/2 bath P2 Hep none  - venofer 50 /wk  - mircera 75 q4, last 4/20 due 5/18  - calcitriol 2.25 ug tiw  - home meds: auryxia 2 ac tid  Assessment/ Plan: 1. Acute resp failure - pulm edema, +/- infection, serial HD over the last 5 days. Repeat CXR improved.  2. MRSA bacteremia  - [facility] 5/18 +Staph aureus   ?source IV site in R hand.  On Vanc.  ID following, No vegetation on TTE. Anticipate 4 weeks of IV vanc per ID.  Repeat BC pending.  3. Hyponatremia - improving with UF on HD.  4. AMS - resolved. Multifactorial etiology - uremia, bacteremia, delirium.  Issues with AMS x2 this week while on HD - unsure of the cause.  No episodes on HD 5/20.  5. ESRD  - HD MWF. Had serial HD x 5days.    Next HD on 5/23.  6. HTN/vol  - Blood pressure well controlled.  Volume status improving with serial HD, net 10L off so far. Tolerated 3L UF 5/20 w/o issue  Now under dry weight. Lower EDW at discharge.  7. Anemia ckd - Hgb 8.5>8.1 . On aranesp 60ug weekly qWed.  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  Phos elevated.  Cont vdra, increase Auryxia to 3AC TID. 9. Nutrition - Alb low, renal diet w/fluid restrictions, protein supplements.    Lynnda Child PA-C [facility] 09/07/2020,8:20 AM

## 2020-09-07 NOTE — Plan of Care (Signed)
  Problem: Elimination: Goal: Will not experience complications related to bowel motility Outcome: Adequate for Discharge   

## 2020-09-08 DIAGNOSIS — J9601 Acute respiratory failure with hypoxia: Secondary | ICD-10-CM | POA: Diagnosis not present

## 2020-09-08 DIAGNOSIS — R7881 Bacteremia: Secondary | ICD-10-CM | POA: Diagnosis not present

## 2020-09-08 DIAGNOSIS — N186 End stage renal disease: Secondary | ICD-10-CM | POA: Diagnosis not present

## 2020-09-08 DIAGNOSIS — E1169 Type 2 diabetes mellitus with other specified complication: Secondary | ICD-10-CM | POA: Diagnosis not present

## 2020-09-08 DIAGNOSIS — B9562 Methicillin resistant Staphylococcus aureus infection as the cause of diseases classified elsewhere: Secondary | ICD-10-CM | POA: Diagnosis not present

## 2020-09-08 LAB — CBC WITH DIFFERENTIAL/PLATELET
Abs Immature Granulocytes: 0.07 10*3/uL (ref 0.00–0.07)
Basophils Absolute: 0 10*3/uL (ref 0.0–0.1)
Basophils Relative: 1 %
Eosinophils Absolute: 0.3 10*3/uL (ref 0.0–0.5)
Eosinophils Relative: 4 %
HCT: 25 % — ABNORMAL LOW (ref 39.0–52.0)
Hemoglobin: 8.4 g/dL — ABNORMAL LOW (ref 13.0–17.0)
Immature Granulocytes: 1 %
Lymphocytes Relative: 18 %
Lymphs Abs: 1.1 10*3/uL (ref 0.7–4.0)
MCH: 30.1 pg (ref 26.0–34.0)
MCHC: 33.6 g/dL (ref 30.0–36.0)
MCV: 89.6 fL (ref 80.0–100.0)
Monocytes Absolute: 0.7 10*3/uL (ref 0.1–1.0)
Monocytes Relative: 11 %
Neutro Abs: 4.2 10*3/uL (ref 1.7–7.7)
Neutrophils Relative %: 65 %
Platelets: 156 10*3/uL (ref 150–400)
RBC: 2.79 MIL/uL — ABNORMAL LOW (ref 4.22–5.81)
RDW: 12.2 % (ref 11.5–15.5)
WBC: 6.4 10*3/uL (ref 4.0–10.5)
nRBC: 0 % (ref 0.0–0.2)

## 2020-09-08 LAB — COMPREHENSIVE METABOLIC PANEL
ALT: 15 U/L (ref 0–44)
AST: 14 U/L — ABNORMAL LOW (ref 15–41)
Albumin: 2.8 g/dL — ABNORMAL LOW (ref 3.5–5.0)
Alkaline Phosphatase: 50 U/L (ref 38–126)
Anion gap: 13 (ref 5–15)
BUN: 79 mg/dL — ABNORMAL HIGH (ref 6–20)
CO2: 24 mmol/L (ref 22–32)
Calcium: 8.8 mg/dL — ABNORMAL LOW (ref 8.9–10.3)
Chloride: 92 mmol/L — ABNORMAL LOW (ref 98–111)
Creatinine, Ser: 12.9 mg/dL — ABNORMAL HIGH (ref 0.61–1.24)
GFR, Estimated: 4 mL/min — ABNORMAL LOW (ref >60)
Glucose, Bld: 174 mg/dL — ABNORMAL HIGH (ref 70–99)
Potassium: 5.2 mmol/L — ABNORMAL HIGH (ref 3.5–5.1)
Sodium: 129 mmol/L — ABNORMAL LOW (ref 135–145)
Total Bilirubin: 0.6 mg/dL (ref 0.3–1.2)
Total Protein: 6.1 g/dL — ABNORMAL LOW (ref 6.5–8.1)

## 2020-09-08 LAB — GLUCOSE, CAPILLARY
Glucose-Capillary: 162 mg/dL — ABNORMAL HIGH (ref 70–99)
Glucose-Capillary: 161 mg/dL — ABNORMAL HIGH (ref 70–99)

## 2020-09-08 LAB — PHOSPHORUS: Phosphorus: 5.6 mg/dL — ABNORMAL HIGH (ref 2.5–4.6)

## 2020-09-08 LAB — MAGNESIUM: Magnesium: 2.5 mg/dL — ABNORMAL HIGH (ref 1.7–2.4)

## 2020-09-08 MED ORDER — SODIUM CHLORIDE 0.9 % IV SOLN: 100.0000 mL | INTRAVENOUS | Status: DC | PRN

## 2020-09-08 MED ORDER — PENTAFLUOROPROP-TETRAFLUOROETH EX AERO: 1.0000 "application " | INHALATION_SPRAY | CUTANEOUS | Status: DC | PRN

## 2020-09-08 MED ORDER — ACETAMINOPHEN 325 MG PO TABS: 650.0000 mg | ORAL_TABLET | Freq: Four times a day (QID) | ORAL | 0 refills | Status: DC | PRN

## 2020-09-08 MED ORDER — CALCITRIOL 0.25 MCG PO CAPS: 2.2500 ug | ORAL_CAPSULE | ORAL | Status: DC

## 2020-09-08 MED ORDER — LIDOCAINE HCL (PF) 1 % IJ SOLN: 5.0000 mL | INTRAMUSCULAR | Status: DC | PRN

## 2020-09-08 MED ORDER — VANCOMYCIN HCL IN DEXTROSE 1-5 GM/200ML-% IV SOLN
INTRAVENOUS | Status: AC
  Administered 2020-09-08: 1000 mg via INTRAVENOUS
  Filled 2020-09-08: qty 200

## 2020-09-08 MED ORDER — PANTOPRAZOLE SODIUM 40 MG PO TBEC: 40.0000 mg | DELAYED_RELEASE_TABLET | Freq: Every day | ORAL | 0 refills | Status: DC

## 2020-09-08 MED ORDER — LIDOCAINE-PRILOCAINE 2.5-2.5 % EX CREA: 1.0000 "application " | TOPICAL_CREAM | CUTANEOUS | Status: DC | PRN

## 2020-09-08 MED ORDER — LOPERAMIDE HCL 2 MG PO CAPS: 2.0000 mg | ORAL_CAPSULE | Freq: Four times a day (QID) | ORAL | 0 refills | Status: DC | PRN

## 2020-09-08 MED ORDER — ALTEPLASE 2 MG IJ SOLR: 2.0000 mg | Freq: Once | INTRAMUSCULAR | Status: DC | PRN

## 2020-09-08 MED ORDER — VANCOMYCIN HCL IN DEXTROSE 1-5 GM/200ML-% IV SOLN
1000.0000 mg | INTRAVENOUS | Status: DC
Start: 2020-09-10 — End: 2021-04-20

## 2020-09-08 NOTE — TOC Transition Note (Signed)
Transition of Care [facility]) - CM/SW Discharge Note   Patient Details  Name: Scott Castillo MRN: [id number] Date of Birth: 08/16/1960  Transition of [facility]) CM/SW Contact:  Ninfa Meeker, RN Phone Number: 09/08/2020, 4:44 PM   Clinical Narrative:   Case manager spoke with patient concerning discharge needs. Discussed Home Health Agencies. Patient gave Case manager permission to arrange for a [place]. Referral called to [facility] Liaison.No DME needs.     Final next level of care: Home w Home Health Services Barriers to Discharge: No Barriers Identified   Patient Goals and CMS Choice Patient states their goals for this hospitalization and ongoing recovery are:: go home   Choice offered to / list presented to : Patient  Discharge Placement                       Discharge Plan and Services In-house Referral: NA Discharge Planning Services: CM Consult Post Acute Care Choice: Home Health          DME Arranged: N/A DME Agency: NA       HH Arranged: PT HH Agency: [place] Date [facility] Agency Contacted: 09/08/20 Time HH Agency Contacted: 1630 Representative spoke with at [place]: [place] Interventions     Readmission Risk Interventions Readmission Risk Prevention Plan 06/19/2020  Transportation Screening Complete  PCP or Specialist Appt within 3-5 Days Complete  HRI or Home Care Consult Complete  Social Work Consult for [place] Planning/Counseling Complete  Palliative Care Screening Complete  Medication Review [employer]) Referral to Pharmacy  Some recent data might be hidden

## 2020-09-08 NOTE — Discharge Summary (Signed)
Physician Discharge Summary  Marice Angelino Gerken TDD:[id number] DOB: [birth year] DOA: 08/31/2020  PCP: Algis Greenhouse, MD  Admit date: 08/31/2020 Discharge date: 09/08/2020  Admitted From: Home Disposition: Home with [facility]   Recommendations for Outpatient Follow-up:  1. Follow up with PCP in 1-2 weeks 2. Follow up with Nephrology 3. Follow up with Infectious Diseases  4. Please obtain CMP/CBC, Mag, Phos in one week 5. 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  He was able to answer questions in the ED and did not have any focal deficits.  Blood pressure was elevated systolics into the 623J.  Labs revealed severe nephric and amount of uremia and azotemia and a BUN/creatinine of 119/17.5.  His COVID testing was negative and chest x-ray is negative for any acute findings.  ED provider spoke lead nephrologist who recommended admission for dialysis.  Prior to him coming to the ED he had been ill and was having vomiting and diarrhea and only could tolerate 1 hour dialysis Monday.  His vomiting has now resolved and is not having abdominal pain.  Because he was significantly uremic he was admitted for dialysis and during his dialysis he became acutely agitated and could not complete more than 1/2 hours of treatment while hospitalized.  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   Nephrology continues to dialyze the patient gently daily and recommending a 2 and half hour session today and in 2 and half hour session tomorrow possibly.  Patient's mental status was back to his baseline however he unfortunately decompensated in Dialysis yesterday and a Rapid Response was called.  **Patient became hypoxic and was septic 5/18 and a stat x-ray showed some pulmonary edema.  He was placed on Precedex drip and cultures were obtained showing MRSA Bacteremia and underwent hemodialysis.  His acute hypoxic respiratory failure is now improved significantly.  Procalcitonin severely elevated in accordance to his severe sepsis with the MRSA Bacteremia.  He was transferred back to the medical floor on 09/05/20 and is improving Mentation wise. ID consulted and recommending repeat Blood Cx and TTE. They may forego the TEE given that his TTE was unrevealing and because repeat Blood Cx show NGTD at 3 Days still.   He was taken to dialysis morning and felt a little anxious while on dialysis and stated that his left hand became numb but then resolved quickly.  He attributed it to the dialysis disequilibrium syndrome and after he came back PT OT evaluated and he was deemed medically stable for discharge as they recommended home health.  Patient felt back to his baseline and no acute issues.  He is stable to be discharged and follow-up with nephrology as well as neurology outpatient setting.  He also need to follow-up with ID given his MRSA bacteremia and will continue IV antibiotics with IV vancomycin 1 g every Monday Wednesday Friday until 10/03/2020.  All his questions were answered and he was deemed stable for discharge at this time and  understands agrees with the plan of care.   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  There was subtle parenchymal volume loss greater than expected for age -EEG done 09/01/20 and showed that the study was suggestive of moderate diffuse encephalopathy which is nonspecific in etiology and no seizures or epileptiform discharges were seen throughout the recording -Presented with a BUN/Cr of 119/17.52 after missing 2.5 Dialysis Sessions -He has had similar spells in the past with dialysis when he was admitted last month -Neurology was consulted and work-up was initiated and felt his presentation was most consistent with dialysis disequilibrium syndrome and recommended no further imaging and recommended compliance with hemodialysis to largely avoid fluctuation of fluid shifts that can be caused by hemodialysis -Nephrology is following appreciate Dialysis recc's -BUN/Cr has improved to 76/12.66 -> 42/8.27 -> 67/11.02 -> 45/8.18 -> 64/10.57 and today it is 79/12.90 -Nephrology was planning on gentle HD with a run time of 2.5 hours for the next few days to get Azotemia down until he decompensated the day before yesterday and had 4 Liters removed today  -He became extremely encephalopathic on 09/03/2020 likely was multifactorial related to dialysis and on  adherence, delirium and sepsis from Bacteremia  -His mentation is much improved and is back to his baseline.  He had to be placed on a Precedex drip to be dialyzed on 09/03/2020 and was watched in the ICU on 09/04/2020 and transferred back to [facility] on 09/05/2020 -He was taken to dialysis today and was deemed medically stable to be discharged afterwards and dialysis renal navigator has going to be arranging antibiotics for the patient at discharge  ESRD with HD MWF Metabolic Acidosis Hyperphosphatemia, Hypermagnesemia; hypokalemia -Patient normally gets dialysis in [facility] in [place] and has missed at least 2.5 dialysis sessions PTA -Patient's BUN/Cr is now gone from 64/10.57 -> 79/12.90 -Patient had a mild Metabolic Acidosis but this is improved as CO2 is now 24, AG of 13, and Chloride of 92 -Avoid Nephrotoxic Medications, Contrast Dyes, Hypotension and Renally adjust medications -Nephrology following and since patient is under drwy weight they are recommending Lowering EDW at Discharge -Patient's magnesium level is now 2.5 and phosphorus level is now 5.6; patient's potassium was 5.2 -He Dialysis the day before yesterday netting UF 3 Liters; Next Dialysis Session is 09/08/20 -Repeat CXR 5/19 afternoon showed "Stable cardiomediastinal silhouette. No pneumothorax or pleural effusion is noted. Right lung is clear. Minimal residual left lung opacities are noted. Bony thorax is unremarkable."  -Continue with Calcitriol 2.25 mcg p.o. every Monday Wednesday Friday with hemodialysis and continue with ferric citrate 420 mg p.o. 3 times daily with meals -Repeat  CMP within 1 week of discharge -PT OT recommending home health  Acute Respiratory Failure with Hypoxia with acute pulmonary edema, improved -In the setting of Pulmonary Edema with possible Concurrent Infection -Had to be put on Precedex to be Dialyzed on 5/18 but now off -Had to be placed on BiPAP and then NRB and now not wearing Supplemental O2  today  -SpO2: 100 % O2 Flow Rate (L/min): 1 L/min FiO2 (%): 60 % -Continuous Pulse Oximetry and Maintain O2 Saturations >90% -Continue to Monitor Respiratory Status Carefully -Repeat CXR in AM and will need an Ambulatory Home O2 Screen Prior to D/C   Severe Sepsis in the setting of MRSA Bacteremia in the setting of ? Infiltrated IV -Patient became Febrile, Tachycardic, Tachypenic with Acute Respiratory Failure with Hypoxia and Altered Mental Status on 09/03/20 -Checked Blood Cx x2 and was + for MRSA, CXR as above, Urinalysis and Urine Cx obtained and Urine Cx pending but U/A was unremarkable -Started Abx on with IV Vancomycin and IV Cefepime but stopped now that MRSA is in blood Cx -Checked PCT and was elevated and went from 1.82 -> 64.33 -> 133.23 -WBC went from 6.8 -> 16.9 -> 5.7 -> 4.9 -> 6.4 -Continue to Monitor Cx's and Follow Temperature Carefully -Suspected source of Bacteremia could be inflitrated IV in Right Hand -ID Consulted for further evaluation and recommendations and recommending repeating blood cultures this morning as well as obtain a TTE to start working out consideration for endocarditis and continue with MRSA contact precautions. -TTE done and showed "No evidence of valvular vegetations on this transthoracic echocardiogram. Would recommend a transesophageal echocardiogram to exclude infective endocarditis if clinically indicated."   -Repeat Blood Cx (NGTD 2 Days) -Per infectious diseases if TTE looks okay and his repeat blood cultures remain with no growth they will likely forego his TEE as endocarditis would seem unlikely and) the case -Anticipate 4 weeks of vancomycin with hemodialysis per ID recc's  Hyperkalemia -Likely to improve with dialysis  -Patient's potassium was 4.1 yesterday and today is 5.2 -Defer to Nephrology to correct but may try some Lokelma if necessary  -Continue monitor and trend and repeat CMP in the a.m.  Hyponatremia -Mild. Patient's Na+ is  now 129 -Expect to correct in Dialysis that is being done today -Continue to Monitor and Trend -Repeat CMP in the AM   Hypertensive Urgency with likely flash pulmonary edema, improved  -Blood pressure was elevated to systolics in the 932T on admission and then again became elevated on 09/03/2020 he decompensated -Resumed home blood pressure medications including carvedilol 25 mg p.o. twice daily and with irbesartan 150 mg p.o. daily -Continue monitor blood pressures per protocol -Last blood pressure reading was 172/96 -Continue IV hydralazine 5 mg every 4 as needed for systolic blood pressure greater than 180 -Also improving with dialysis  Diarrhea, improved  -Likely viral gastroenteritis-has not been on any recent antibiotics or has any reported fevers -His nausea vomiting and diarrhea has resolved -His GI pathogen panel and C. difficile was negative -Continue to treat supportively with antidiarrheals if necessary  Diabetes Mellitus Type 2 -Recent hemoglobin A1c was 6.4 -Continue with very sensitive NovoLog sliding scale insulin before meals and at bedtime -Continue monitor blood sugars carefully and per protocol; CBGs ranging from 147-175  Hyperlipidemia -Continue with Pravastatin 80 mg p.o. every evening  Anemia of Chronic Kidney Disease -Stable. Patient's Hgb/Hct is now gone from 9.2/27.3 -> 8.8/26.9 -> 8.5/25.7 -> 8.1/24.2 -> 8.4/25.0 -Nephrology ordering Darbepoetin Alfa 60 mcg weekly  wile hospitalized and his first dose was Wednesday, 09/03/2020 -Checked Anemia Panel and showed an iron level of 27, U IBC 186, TIBC of 213, saturation ratio 13%, ferritin level 714, folate of 20.3, and vitamin B12 of 357 -Continue to Monitor for S/Sx of Bleeding; Currently no overt bleeding noted -Repeat CBC in the AM   Thrombocytopenia -Patient's Platelet Count went from 166 -> 139 -> 138 -> 138 -> 156 -In the setting of Infection and Bacteremia -Continue to Monitor for S/Sx of Bleeding;  Currently no overt bleeding noted -Repeat CBC in the AM   Obesity -Complicates overall prognosis and care -Estimated body mass index is 32.68 kg/m as calculated from the following:   Height as of this encounter: 6' (1.829 m).   Weight as of this encounter: 109.3 kg. -Weight Loss and Dietary Counseling given   Discharge Instructions  Discharge Instructions    Call MD for:  difficulty breathing, headache or visual disturbances   Complete by: As directed    Call MD for:  difficulty breathing, headache or visual disturbances   Complete by: As directed    Call MD for:  extreme fatigue   Complete by: As directed    Call MD for:  extreme fatigue   Complete by: As directed    Call MD for:  hives   Complete by: As directed    Call MD for:  hives   Complete by: As directed    Call MD for:  persistant dizziness or light-headedness   Complete by: As directed    Call MD for:  persistant dizziness or light-headedness   Complete by: As directed    Call MD for:  persistant nausea and vomiting   Complete by: As directed    Call MD for:  persistant nausea and vomiting   Complete by: As directed    Call MD for:  redness, tenderness, or signs of infection (pain, swelling, redness, odor or green/yellow discharge around incision site)   Complete by: As directed    Call MD for:  redness, tenderness, or signs of infection (pain, swelling, redness, odor or green/yellow discharge around incision site)   Complete by: As directed    Call MD for:  severe uncontrolled pain   Complete by: As directed    Call MD for:  severe uncontrolled pain   Complete by: As directed    Call MD for:  temperature >100.4   Complete by: As directed    Call MD for:  temperature >100.4   Complete by: As directed    Diet - low sodium heart healthy   Complete by: As directed    RENAL CARB MODIFIED DIET with 1200 mL Fluid Restriction   Diet - low sodium heart healthy   Complete by: As directed    RENAL CARB MODIFIED  DIET with 1200 mL FLUID RESTRICTION   Discharge instructions   Complete by: As directed    You were cared for by a hospitalist during your hospital stay. If you have any questions about your discharge medications or the care you received while you were in the hospital after you are discharged, you can call the unit and ask to speak with the hospitalist on call if the hospitalist that took care of you is not available. Once you are discharged, your primary care physician will handle any further medical issues. Please note that NO REFILLS for any discharge medications will be authorized once you are discharged, as it is imperative that you return to your  primary care physician (or establish a relationship with a primary care physician if you do not have one) for your aftercare needs so that they can reassess your need for medications and monitor your lab values.  Follow up with PCP, Nephrology, Neurology, and ID as an outpatient. Take all medications as prescribed. If symptoms change or worsen please return to the ED for evaluation.   Discharge instructions   Complete by: As directed    You were cared for by a hospitalist during your hospital stay. If you have any questions about your discharge medications or the care you received while you were in the hospital after you are discharged, you can call the unit and ask to speak with the hospitalist on call if the hospitalist that took care of you is not available. Once you are discharged, your primary care physician will handle any further medical issues. Please note that NO REFILLS for any discharge medications will be authorized once you are discharged, as it is imperative that you return to your primary care physician (or establish a relationship with a primary care physician if you do not have one) for your aftercare needs so that they can reassess your need for medications and monitor your lab values.  Follow up with PCP, Nephrology, Neurology and  Infectious Diseases as an outpatient. Take all medications as prescribed. If symptoms change or worsen please return to the ED for evaluation   Increase activity slowly   Complete by: As directed    Increase activity slowly   Complete by: As directed      Allergies as of 09/08/2020      Reactions   Heparin Other (See Comments)   Per pt he has episodes where he can't speak   Pioglitazone Swelling   Site of swelling not recalled by patient   Adhesive [tape] Other (See Comments)   Causes redness, pt prefers paper tape    Latex Rash   Redness, also      Medication List    TAKE these medications   acetaminophen 325 MG tablet Commonly known as: TYLENOL Take 2 tablets (650 mg total) by mouth every 6 (six) hours as needed for mild pain (or Fever >/= 101).   ascorbic acid 1000 MG tablet Commonly known as: VITAMIN C Take 1,000 mg by mouth daily.   BD ULTRA-FINE PEN NEEDLES 29G X 12.7MM Misc Generic drug: Insulin Pen Needle 1 each by Other route as directed.   calcitRIOL 0.25 MCG capsule Commonly known as: ROCALTROL Take 9 capsules (2.25 mcg total) by mouth every Monday, Wednesday, and Friday with hemodialysis. Start taking on: Sep 10, 2020   carvedilol 25 MG tablet Commonly known as: COREG Take 25 mg by mouth 2 (two) times daily with a meal.   cyanocobalamin 1000 MCG tablet Take 1,000 mcg by mouth daily.   ferric citrate 1 GM 210 MG(Fe) tablet Commonly known as: AURYXIA Take 210-420 mg by mouth See admin instructions. Take 2 tablets (420 mg) by mouth 3 times daily with meals and 1 tablet (210 mg) with snacks   irbesartan 150 MG tablet Commonly known as: AVAPRO Take 150 mg by mouth daily.   loperamide 2 MG capsule Commonly known as: IMODIUM Take 1 capsule (2 mg total) by mouth every 6 (six) hours as needed for diarrhea or loose stools.   pantoprazole 40 MG tablet Commonly known as: PROTONIX Take 1 tablet (40 mg total) by mouth daily at 12 noon. Start taking on: Sep 09, 2020  pravastatin 80 MG tablet Commonly known as: PRAVACHOL Take 80 mg by mouth every evening.   Rybelsus 14 MG Tabs Generic drug: Semaglutide Take 14 mg by mouth daily.   torsemide 100 MG tablet Commonly known as: DEMADEX Take 100 mg by mouth See admin instructions. Takes on Tuesday,Thursday,Saturday and Sunday(non-dialysis days)   vancomycin 1-5 GM/200ML-% Soln Commonly known as: VANCOCIN Inject 200 mLs (1,000 mg total) into the vein every Monday, Wednesday, and Friday with hemodialysis. Start taking on: Sep 10, 2020       Follow-up Information    Dough, Jaymes Graff, MD. Call.   Specialty: Family Medicine Why: Follow up within 1-2 weeks Contact information: [address] [place] [address] [phone]        [facility]             . Call.   Why: Follow up within 4 weeks of Discharge Contact information: [address] [place] [address]       Care, [facility] Follow up.   Specialty: [facility] Why: A representative from [facility] will contact you to arrange start date and time for your therapy. Contact information: [place] [address] [phone]              Allergies  Allergen Reactions  . Heparin Other (See Comments)    Per pt he has episodes where he can't speak  . Pioglitazone Swelling    Site of swelling not recalled by patient  . Adhesive [Tape] Other (See Comments)    Causes redness, pt prefers paper tape   . Latex Rash    Redness, also   Consultations:  Infectious diseases  Neurology  Nephrology  Procedures/Studies: DG Chest 2 View  Result Date: 08/31/2020 CLINICAL DATA:  Altered mental status.  Chronic renal failure EXAM: CHEST - 2 VIEW COMPARISON:  August 15, 2020 FINDINGS: No edema or airspace opacity. Heart size and pulmonary vascularity are within normal limits. No adenopathy. IMPRESSION: No edema or  airspace opacity. Cardiac silhouette within normal limits. Electronically Signed   By: Lowella Grip III M.D.   On: 08/31/2020 23:38   CT HEAD WO CONTRAST  Result Date: 09/01/2020 CLINICAL DATA:  Altered mental status. EXAM: CT HEAD WITHOUT CONTRAST TECHNIQUE: Contiguous axial images were obtained from the base of the skull through the vertex without intravenous contrast. COMPARISON:  June 16, 2020 FINDINGS: Brain: There is moderate severity cerebral atrophy with widening of the extra-axial spaces and ventricular dilatation. This is stable in severity and greater than expected for the patient's age. There are areas of decreased attenuation within the white matter tracts of the supratentorial brain, consistent with microvascular disease changes. Vascular: No hyperdense vessel or unexpected calcification. Skull: Normal. Negative for fracture or focal lesion. Sinuses/Orbits: No acute finding. Other: None. IMPRESSION: 1. Generalized cerebral atrophy. 2. No acute intracranial abnormality. Electronically Signed   By: Virgina Norfolk M.D.   On: 09/01/2020 23:29   DG CHEST PORT 1 VIEW  Result Date: 09/04/2020 CLINICAL DATA:  Altered mental status. EXAM: PORTABLE CHEST 1 VIEW COMPARISON:  Same day. FINDINGS: Stable cardiomediastinal silhouette. No pneumothorax or pleural effusion is noted. Right lung is clear. Minimal residual left lung opacities are noted. Bony thorax is unremarkable. IMPRESSION: Minimal residual left lung opacities are noted. Electronically Signed   By: Marijo Conception M.D.   On: 09/04/2020 14:22   DG Chest [facility] 1 View  Result  Date: 09/04/2020 CLINICAL DATA:  Respiratory failure.  Hypoxia. EXAM: PORTABLE CHEST 1 VIEW COMPARISON:  09/03/2020. FINDINGS: Mediastinum and hilar structures normal. Cardiomegaly and pulmonary venous congestion again noted. Low lung volumes. Interim partial clearing of interstitial edema/infiltrates. No pleural effusion or pneumothorax. Left costophrenic angle  incompletely imaged. IMPRESSION: Cardiomegaly and pulmonary venous congestion again noted. Low lung volumes. Interim partial clearing of interstitial edema/infiltrates. Electronically Signed   By: Marcello Moores  Register   On: 09/04/2020 05:35   DG CHEST PORT 1 VIEW  Result Date: 09/03/2020 CLINICAL DATA:  Shortness of breath EXAM: PORTABLE CHEST 1 VIEW COMPARISON:  Radiograph 08/31/2016 FINDINGS: Diffuse fluffy reticulonodular and patchy opacities are present throughout both lungs with developing pulmonary vascular congestion and redistribution as well as fissural and septal thickening. Cardiac silhouette is prominent though may be accentuated by low volumes and portable technique. No acute osseous or soft tissue abnormality. Telemetry leads overlie the chest. IMPRESSION: Worsening pulmonary vascular congestion and features of interstitial edema. More hazy reticulonodular opacities, likely reflect developing alveolar edema though underlying or superimposed infection is difficult to exclude in the appropriate clinical setting. Electronically Signed   By: Lovena Le M.D.   On: 09/03/2020 16:24   DG Chest Port 1 View  Result Date: 08/15/2020 CLINICAL DATA:  Short of breath.  Dialysis patient. EXAM: PORTABLE CHEST 1 VIEW COMPARISON:  05/02/2020 FINDINGS: Heart size upper normal. Mild vascular congestion without edema or effusion. Negative for infiltrate. IMPRESSION: Mild vascular congestion without edema Electronically Signed   By: Franchot Gallo M.D.   On: 08/15/2020 11:02   EEG adult  Result Date: 09/01/2020 Lora Havens, MD     09/01/2020  6:17 PM Patient Name: Nova Evett Demello MRN: [id number] Epilepsy Attending: Lora Havens Referring Physician/Provider: Anibal Henderson, NP Date: 09/01/2020 Duration: 23.38 mins Patient history: 60yo M with episode of confusion during dialysis. EEG to evaluate for seizure Level of alertness: Awake AEDs during EEG study: Ativan Technical aspects: This EEG study was done  with scalp electrodes positioned according to the 10-20 International system of electrode placement. Electrical activity was acquired at a sampling rate of 500Hz  and reviewed with a high frequency filter of 70Hz  and a low frequency filter of 1Hz . EEG data were recorded continuously and digitally stored. Description: No posterior dominant rhythm was seen. EEG showed continuous generalized 3 to 6 Hz theta-delta slowing. Hyperventilation and photic stimulation were not performed.   ABNORMALITY - Continuous slow, generalized IMPRESSION: This study is suggestive of moderate diffuse encephalopathy, nonspecific etiology. No seizures or epileptiform discharges were seen throughout the recording. Priyanka [name]   Overnight EEG with video  Result Date: 08/17/2020 Lora Havens, MD     08/17/2020  2:03 PM Patient Name: Shaheim Mahar Leitch MRN: [id number] Epilepsy Attending: Lora Havens Referring Physician/Provider: Dr Su Monks Duration: 08/16/2020 4403 to 08/17/2020 0855 Patient history: 60yo M with episodic aphasia, slurred speech,  suspected o be closely associated with his hemodialysis. EEG to evaluate for seizure. Level of alertness: Awake, asleep AEDs during EEG study: None Technical aspects: This EEG study was done with scalp electrodes positioned according to the 10-20 International system of electrode placement. Electrical activity was acquired at a sampling rate of 500Hz  and reviewed with a high frequency filter of 70Hz  and a low frequency filter of 1Hz . EEG data were recorded continuously and digitally stored. Description: The posterior dominant rhythm consists of 7.5 Hz activity of moderate voltage (25-35 uV) seen predominantly in posterior head regions, symmetric and reactive  to eye opening and eye closing. Sleep was characterized by vertex waves, sleep spindles (12 to 14 Hz), maximal frontocentral region.  EEG showed intermittent generalized and maximal left temporal 3 to 6 Hz theta-delta slowing.  Hyperventilation and photic stimulation were not performed.   ABNORMALITY - Intermittent slow, generalized and maximal left temporal region IMPRESSION: This study is suggestive of cortical dysfunction arising from left temporal region, nonspecific etiology. Additionally, there iss mild diffuse encephalopathy, nonspecific etiology. No seizures or epileptiform discharges were seen throughout the recording. Lora Havens   ECHOCARDIOGRAM COMPLETE  Result Date: 09/05/2020    ECHOCARDIOGRAM REPORT   Patient Name:   LENVILLE HIBBERD Jorge Date of Exam: 09/05/2020 Medical Rec #:  [id number]       Height:       72.0 in Accession #:    [id number]      Weight:       234.1 lb Date of Birth:  [birth year]       BSA:          2.278 m Patient Age:    60 years        BP:           124/56 mmHg Patient Gender: M               HR:           84 bpm. Exam Location:  Inpatient Procedure: 2D Echo, Cardiac Doppler and Color Doppler Indications:    Bacteremia  History:        Patient has prior history of Echocardiogram examinations, most                 recent 06/17/2020. Risk Factors:Diabetes and Hypertension. ESRD.                 MRSA bacteremia.  Sonographer:    Clayton Lefort RDCS (AE) Referring Phys: [id number] [place]  Sonographer Comments: No subcostal window and suboptimal parasternal window. IMPRESSIONS  1. Left ventricular ejection fraction, by estimation, is 45 to 50%. The left ventricle has mildly decreased function. The left ventricle demonstrates global hypokinesis. There is mild left ventricular hypertrophy. Left ventricular diastolic parameters are consistent with Grade I diastolic dysfunction (impaired relaxation).  2. Right ventricular systolic function is normal. The right ventricular size is normal. Tricuspid regurgitation signal is inadequate for assessing PA pressure.  3. Left atrial size was severely dilated.  4. The mitral valve is abnormal. There is mild thickening of the mitral valve leaflet(s). There is mild  calcification of the mitral valve leaflet(s). Trivial mitral valve regurgitation.  5. The aortic valve was not well visualized. Aortic valve regurgitation is not visualized. Mild to moderate aortic valve sclerosis/calcification is present, without any evidence of aortic stenosis.  6. Aortic dilatation noted. There is mild dilatation of the aortic root, measuring 38 mm. There is borderline dilatation of the ascending aorta, measuring 36 mm.  7. No valvular vegetations visualized. Comparison(s): Compared to prior TTE in 06/2020, the LVEF appears mildly reduced 45-50%. LV trabeculations were not well visualzed like they were on previous study. Conclusion(s)/Recommendation(s): No evidence of valvular vegetations on this transthoracic echocardiogram. Would recommend a transesophageal echocardiogram to exclude infective endocarditis if clinically indicated. FINDINGS  Left Ventricle: Left ventricular ejection fraction, by estimation, is 45 to 50%. The left ventricle has mildly decreased function. The left ventricle demonstrates global hypokinesis. The left ventricular internal cavity size was normal in size. There is  mild left ventricular hypertrophy. Left ventricular diastolic  parameters are consistent with Grade I diastolic dysfunction (impaired relaxation). Right Ventricle: The right ventricular size is normal. Right vetricular wall thickness was not well visualized. Right ventricular systolic function is normal. Tricuspid regurgitation signal is inadequate for assessing PA pressure. Left Atrium: Left atrial size was severely dilated. Right Atrium: Right atrial size was normal in size. Pericardium: There is no evidence of pericardial effusion. Mitral Valve: The mitral valve is abnormal. There is mild thickening of the mitral valve leaflet(s). There is mild calcification of the mitral valve leaflet(s). Mild to moderate mitral annular calcification. Trivial mitral valve regurgitation. MV peak gradient, 4.4 mmHg. The mean  mitral valve gradient is 2.0 mmHg. Tricuspid Valve: The tricuspid valve is normal in structure. Tricuspid valve regurgitation is trivial. Aortic Valve: The aortic valve was not well visualized. Aortic valve regurgitation is not visualized. Mild to moderate aortic valve sclerosis/calcification is present, without any evidence of aortic stenosis. Aortic valve mean gradient measures 4.0 mmHg.  Aortic valve peak gradient measures 7.1 mmHg. Aortic valve area, by VTI measures 3.57 cm. Pulmonic Valve: The pulmonic valve was normal in structure. Pulmonic valve regurgitation is trivial. Aorta: The aortic root is normal in size and structure and aortic dilatation noted. There is mild dilatation of the aortic root, measuring 38 mm. There is borderline dilatation of the ascending aorta, measuring 36 mm. IAS/Shunts: No atrial level shunt detected by color flow Doppler.  LEFT VENTRICLE PLAX 2D LVIDd:         4.20 cm      Diastology LVIDs:         3.80 cm      LV e' medial:    5.66 cm/s LV PW:         1.70 cm      LV E/e' medial:  14.3 LV IVS:        1.40 cm      LV e' lateral:   11.50 cm/s LVOT diam:     2.40 cm      LV E/e' lateral: 7.0 LV SV:         76 LV SV Index:   33 LVOT Area:     4.52 cm  LV Volumes (MOD) LV vol d, MOD A2C: 169.0 ml LV vol d, MOD A4C: 212.0 ml LV vol s, MOD A2C: 96.3 ml LV vol s, MOD A4C: 119.0 ml LV SV MOD A2C:     72.7 ml LV SV MOD A4C:     212.0 ml LV SV MOD BP:      83.4 ml RIGHT VENTRICLE RV Basal diam:  3.20 cm RV S prime:     13.20 cm/s TAPSE (M-mode): 2.6 cm LEFT ATRIUM              Index       RIGHT ATRIUM           Index LA diam:        4.30 cm  1.89 cm/m  RA Area:     20.30 cm LA Vol (A2C):   132.0 ml 57.94 ml/m RA Volume:   59.60 ml  26.16 ml/m LA Vol (A4C):   102.0 ml 44.77 ml/m LA Biplane Vol: 118.0 ml 51.80 ml/m  AORTIC VALVE AV Area (Vmax):    3.24 cm AV Area (Vmean):   3.27 cm AV Area (VTI):     3.57 cm AV Vmax:           133.00 cm/s AV Vmean:  90.700 cm/s AV VTI:             0.213 m AV Peak Grad:      7.1 mmHg AV Mean Grad:      4.0 mmHg LVOT Vmax:         95.30 cm/s LVOT Vmean:        65.500 cm/s LVOT VTI:          0.168 m LVOT/AV VTI ratio: 0.79  AORTA Ao Root diam: 3.90 cm Ao Asc diam:  3.60 cm MITRAL VALVE MV Area (PHT): 3.30 cm    SHUNTS MV Area VTI:   2.95 cm    Systemic VTI:  0.17 m MV Peak grad:  4.4 mmHg    Systemic Diam: 2.40 cm MV Mean grad:  2.0 mmHg MV Vmax:       1.05 m/s MV Vmean:      65.9 cm/s MV Decel Time: 230 msec MV E velocity: 81.00 cm/s MV A velocity: 85.30 cm/s MV E/A ratio:  0.95 Gwyndolyn Kaufman MD Electronically signed by Gwyndolyn Kaufman MD Signature Date/Time: 09/05/2020/6:33:08 PM    Final     Subjective: Seen and examined at bedside in dialysis and states that he is feeling a little off this morning and states that he has been saying "feeling" that he had previously but states that is not as bad.  After dialysis is feeling improved and back to his baseline.  He stated that he had some mild numbness in his left hand but this was improved after his dialysis session was completed.  PT OT evaluated and recommended home health.  Patient was deemed medically stable to be discharged as he is significantly improved and back to his baseline mentation wise.  He will be discharged home and understands that he will need to continue antibiotics with dialysis until 10/03/2020.   Discharge Exam: Vitals:   09/08/20 1135 09/08/20 1200  BP: (!) 172/96 (!) 172/96  Pulse:    Resp: 16 15  Temp:  98 F (36.7 C)  SpO2: 100% 100%   Vitals:   09/08/20 1100 09/08/20 1130 09/08/20 1135 09/08/20 1200  BP: (!) 153/85 (!) 160/85 (!) 172/96 (!) 172/96  Pulse:      Resp: 15 14 16 15   Temp:    98 F (36.7 C)  TempSrc:    Oral  SpO2:   100% 100%  Weight:    109.3 kg  Height:       General: Pt is alert, awake, not in acute distress but does appear a little bit anxious Cardiovascular: RRR, S1/S2 +, no rubs, no gallops Respiratory: Diminished bilaterally,  no wheezing, no rhonchi; unlabored breathing and not wearing supplemental oxygen via nasal cannula Abdominal: Soft, NT, distended, bowel sounds + Extremities: 1+ edema with chronic wounds that do not appear to be infected and some venous stasis, no cyanosis  The results of significant diagnostics from this hospitalization (including imaging, microbiology, ancillary and laboratory) are listed below for reference.    Microbiology: Recent Results (from the past 240 hour(s))  Resp Panel by RT-PCR (Flu A&B, Covid) Nasopharyngeal Swab     Status: None   Collection Time: 08/31/20 11:43 PM   Specimen: Nasopharyngeal Swab; Nasopharyngeal(NP) swabs in vial transport medium  Result Value Ref Range Status   SARS Coronavirus 2 by RT PCR NEGATIVE NEGATIVE Final    Comment: (NOTE) SARS-CoV-2 target nucleic acids are NOT DETECTED.  The SARS-CoV-2 RNA is generally detectable in upper respiratory specimens during the acute phase  of infection. The lowest concentration of SARS-CoV-2 viral copies this assay can detect is 138 copies/mL. A negative result does not preclude SARS-Cov-2 infection and should not be used as the sole basis for treatment or other patient management decisions. A negative result may occur with  improper specimen collection/handling, submission of specimen other than nasopharyngeal swab, presence of viral mutation(s) within the areas targeted by this assay, and inadequate number of viral copies(<138 copies/mL). A negative result must be combined with clinical observations, patient history, and epidemiological information. The expected result is Negative.  Fact Sheet for Patients:  EntrepreneurPulse.com.au  Fact Sheet for Healthcare Providers:  IncredibleEmployment.be  This test is no t yet approved or cleared by the [place] FDA and  has been authorized for detection and/or diagnosis of SARS-CoV-2 by FDA under an Emergency Use  Authorization (EUA). This EUA will remain  in effect (meaning this test can be used) for the duration of the COVID-19 declaration under Section 564(b)(1) of the Act, 21 U.S.C.section 360bbb-3(b)(1), unless the authorization is terminated  or revoked sooner.       Influenza A by PCR NEGATIVE NEGATIVE Final   Influenza B by PCR NEGATIVE NEGATIVE Final    Comment: (NOTE) The Xpert Xpress SARS-CoV-2/FLU/RSV plus assay is intended as an aid in the diagnosis of influenza from Nasopharyngeal swab specimens and should not be used as a sole basis for treatment. Nasal washings and aspirates are unacceptable for Xpert Xpress SARS-CoV-2/FLU/RSV testing.  Fact Sheet for Patients: EntrepreneurPulse.com.au  Fact Sheet for Healthcare Providers: IncredibleEmployment.be  This test is not yet approved or cleared by the [place] FDA and has been authorized for detection and/or diagnosis of SARS-CoV-2 by FDA under an Emergency Use Authorization (EUA). This EUA will remain in effect (meaning this test can be used) for the duration of the COVID-19 declaration under Section 564(b)(1) of the Act, 21 U.S.C. section 360bbb-3(b)(1), unless the authorization is terminated or revoked.  Performed at [facility], [place] [address], [place], [place] [address]   Gastrointestinal Panel by PCR , Stool     Status: None   Collection Time: 09/01/20  4:05 AM   Specimen: Stool  Result Value Ref Range Status   Campylobacter species NOT DETECTED NOT DETECTED Final   Plesimonas shigelloides NOT DETECTED NOT DETECTED Final   Salmonella species NOT DETECTED NOT DETECTED Final   Yersinia enterocolitica NOT DETECTED NOT DETECTED Final   Vibrio species NOT DETECTED NOT DETECTED Final   Vibrio cholerae NOT DETECTED NOT DETECTED Final   Enteroaggregative E coli (EAEC) NOT DETECTED NOT DETECTED Final   Enteropathogenic E coli (EPEC) NOT DETECTED NOT DETECTED Final    Enterotoxigenic E coli (ETEC) NOT DETECTED NOT DETECTED Final   Shiga like toxin producing E coli (STEC) NOT DETECTED NOT DETECTED Final   Shigella/Enteroinvasive E coli (EIEC) NOT DETECTED NOT DETECTED Final   Cryptosporidium NOT DETECTED NOT DETECTED Final   Cyclospora cayetanensis NOT DETECTED NOT DETECTED Final   Entamoeba histolytica NOT DETECTED NOT DETECTED Final   Giardia lamblia NOT DETECTED NOT DETECTED Final   Adenovirus F40/41 NOT DETECTED NOT DETECTED Final   Astrovirus NOT DETECTED NOT DETECTED Final   Norovirus GI/GII NOT DETECTED NOT DETECTED Final   Rotavirus A NOT DETECTED NOT DETECTED Final   Sapovirus (I, II, IV, and V) NOT DETECTED NOT DETECTED Final    Comment: Performed at [facility], [address] [place], [place] [address]  C Difficile Quick Screen w PCR reflex  Status: None   Collection Time: 09/01/20 12:03 PM   Specimen: STOOL  Result Value Ref Range Status   C Diff antigen NEGATIVE NEGATIVE Final   C Diff toxin NEGATIVE NEGATIVE Final   C Diff interpretation No C. difficile detected.  Final    Comment: Performed at [facility], [place] [address], [place], [place] [address]  MRSA PCR Screening     Status: Abnormal   Collection Time: 09/01/20 12:15 PM   Specimen: Nasopharyngeal  Result Value Ref Range Status   MRSA by PCR POSITIVE (A) NEGATIVE Final    Comment:        The GeneXpert MRSA Assay (FDA approved for NASAL specimens only), is one component of a comprehensive MRSA colonization surveillance program. It is not intended to diagnose MRSA infection nor to guide or monitor treatment for MRSA infections. RESULT CALLED TO, READ BACK BY AND VERIFIED WITH: A CREWS RN [id number] 09/01/20 A BROWNING Performed at [facility], [place] [address], [place], [place] [address]   Culture, blood (routine x 2)     Status: Abnormal   Collection Time: 09/03/20  6:13 PM   Specimen: BLOOD RIGHT HAND  Result Value Ref Range Status   Specimen  Description BLOOD RIGHT HAND  Final   Special Requests   Final    BOTTLES DRAWN AEROBIC ONLY Blood Culture adequate volume   Culture  Setup Time   Final    GRAM POSITIVE COCCI IN CLUSTERS AEROBIC BOTTLE ONLY CRITICAL RESULT CALLED TO, READ BACK BY AND VERIFIED WITH: PHARMD MADISON YATES AT 1287 ON 09/04/20 BY KJ Performed at [facility], [place] [address] [place], [place] [address]    Culture METHICILLIN RESISTANT STAPHYLOCOCCUS AUREUS (A)  Final   Report Status 09/06/2020 FINAL  Final   Organism ID, Bacteria METHICILLIN RESISTANT STAPHYLOCOCCUS AUREUS  Final      Susceptibility   Methicillin resistant staphylococcus aureus - MIC*    CIPROFLOXACIN >=8 RESISTANT Resistant     ERYTHROMYCIN <=0.25 SENSITIVE Sensitive     GENTAMICIN <=0.5 SENSITIVE Sensitive     OXACILLIN >=4 RESISTANT Resistant     TETRACYCLINE <=1 SENSITIVE Sensitive     VANCOMYCIN <=0.5 SENSITIVE Sensitive     TRIMETH/SULFA <=10 SENSITIVE Sensitive     CLINDAMYCIN <=0.25 SENSITIVE Sensitive     RIFAMPIN <=0.5 SENSITIVE Sensitive     Inducible Clindamycin NEGATIVE Sensitive     * METHICILLIN RESISTANT STAPHYLOCOCCUS AUREUS  Blood Culture ID Panel (Reflexed)     Status: Abnormal   Collection Time: 09/03/20  6:13 PM  Result Value Ref Range Status   Enterococcus faecalis NOT DETECTED NOT DETECTED Final   Enterococcus Faecium NOT DETECTED NOT DETECTED Final   Listeria monocytogenes NOT DETECTED NOT DETECTED Final   Staphylococcus species DETECTED (A) NOT DETECTED Final    Comment: CRITICAL RESULT CALLED TO, READ BACK BY AND VERIFIED WITH: PHARMD MADSION YATES AT 1325 ON 09/04/20 BY KJ    Staphylococcus aureus (BCID) DETECTED (A) NOT DETECTED Final    Comment: Methicillin (oxacillin)-resistant Staphylococcus aureus (MRSA). MRSA is predictably resistant to beta-lactam antibiotics (except ceftaroline). Preferred therapy is vancomycin unless clinically contraindicated. Patient requires contact precautions if   hospitalized. CRITICAL RESULT CALLED TO, READ BACK BY AND VERIFIED WITH: PHARMD MADISON YATES AT 2094 ON 09/04/20 BY KJ    Staphylococcus epidermidis NOT DETECTED NOT DETECTED Final   Staphylococcus lugdunensis NOT DETECTED NOT DETECTED Final   Streptococcus species NOT DETECTED NOT DETECTED Final   Streptococcus  agalactiae NOT DETECTED NOT DETECTED Final   Streptococcus pneumoniae NOT DETECTED NOT DETECTED Final   Streptococcus pyogenes NOT DETECTED NOT DETECTED Final   A.calcoaceticus-baumannii NOT DETECTED NOT DETECTED Final   Bacteroides fragilis NOT DETECTED NOT DETECTED Final   Enterobacterales NOT DETECTED NOT DETECTED Final   Enterobacter cloacae complex NOT DETECTED NOT DETECTED Final   Escherichia coli NOT DETECTED NOT DETECTED Final   Klebsiella aerogenes NOT DETECTED NOT DETECTED Final   Klebsiella oxytoca NOT DETECTED NOT DETECTED Final   Klebsiella pneumoniae NOT DETECTED NOT DETECTED Final   Proteus species NOT DETECTED NOT DETECTED Final   Salmonella species NOT DETECTED NOT DETECTED Final   Serratia marcescens NOT DETECTED NOT DETECTED Final   Haemophilus influenzae NOT DETECTED NOT DETECTED Final   Neisseria meningitidis NOT DETECTED NOT DETECTED Final   Pseudomonas aeruginosa NOT DETECTED NOT DETECTED Final   Stenotrophomonas maltophilia NOT DETECTED NOT DETECTED Final   Candida albicans NOT DETECTED NOT DETECTED Final   Candida auris NOT DETECTED NOT DETECTED Final   Candida glabrata NOT DETECTED NOT DETECTED Final   Candida krusei NOT DETECTED NOT DETECTED Final   Candida parapsilosis NOT DETECTED NOT DETECTED Final   Candida tropicalis NOT DETECTED NOT DETECTED Final   Cryptococcus neoformans/gattii NOT DETECTED NOT DETECTED Final   Meth resistant mecA/C and MREJ DETECTED (A) NOT DETECTED Final    Comment: CRITICAL RESULT CALLED TO, READ BACK BY AND VERIFIED WITH: PHARMD MADISON YATES AT 1325 BY KJ ON 09/03/20 Performed at [facility], [address], [place], [place] [address]   Culture, blood (routine x 2)     Status: Abnormal   Collection Time: 09/03/20  6:15 PM   Specimen: BLOOD RIGHT HAND  Result Value Ref Range Status   Specimen Description BLOOD RIGHT HAND  Final   Special Requests   Final    BOTTLES DRAWN AEROBIC ONLY Blood Culture adequate volume   Culture  Setup Time GRAM POSITIVE COCCI AEROBIC BOTTLE ONLY   Final   Culture (A)  Final    STAPHYLOCOCCUS AUREUS SUSCEPTIBILITIES PERFORMED ON PREVIOUS CULTURE WITHIN THE LAST 5 DAYS. Performed at [facility], [place] [address], [place], [place] [address]    Report Status 09/06/2020 FINAL  Final  Culture, Urine     Status: Abnormal   Collection Time: 09/03/20  9:39 PM   Specimen: Urine, Random  Result Value Ref Range Status   Specimen Description URINE, RANDOM  Final   Special Requests NONE  Final   Culture (A)  Final    <10,000 COLONIES/mL INSIGNIFICANT GROWTH Performed at [facility], [place] [address] [place], [place] [address]    Report Status 09/06/2020 FINAL  Final  Culture, blood (routine x 2)     Status: None (Preliminary result)   Collection Time: 09/05/20  5:59 AM   Specimen: BLOOD  Result Value Ref Range Status   Specimen Description BLOOD RIGHT ANTECUBITAL  Final   Special Requests   Final    BOTTLES DRAWN AEROBIC AND ANAEROBIC Blood Culture adequate volume   Culture   Final    NO GROWTH 3 DAYS Performed at [facility], [place] [address], [place], [place] [address]    Report Status PENDING  Incomplete  Culture, blood (routine x 2)     Status: None (Preliminary result)   Collection Time: 09/05/20  6:02 AM   Specimen: BLOOD  Result Value Ref Range Status   Specimen Description BLOOD BLOOD RIGHT HAND  Final   Special Requests AEROBIC BOTTLE ONLY Blood Culture adequate volume  Final   Culture   Final    NO GROWTH 3 DAYS Performed at [facility], [address], [place], [place] [address]    Report Status PENDING  Incomplete     Labs: BNP (last 3 results) No results for input(s): BNP in the last 8760 hours. Basic Metabolic Panel: Recent Labs  Lab 09/04/20 0153 09/04/20 0827 09/05/20 0559 09/06/20 0146 09/07/20 0146 09/08/20 0407  NA 133* 132* 129* 131* 131* 129*  K 5.2* 4.2 4.4 3.9 4.1 5.2*  CL 98 95* 95* 94* 94* 92*  CO2 21* 24 23 25 24 24   GLUCOSE 189* 156* 150* 144* 191* 174*  BUN 75* 42* 67* 45* 64* 79*  CREATININE 12.99* 8.27* 11.02* 8.18* 10.57* 12.90*  CALCIUM 8.5* 8.7* 8.7* 8.7* 8.4* 8.8*  MG 2.0  --  2.5* 2.2 2.4 2.5*  PHOS 8.3*  --  7.1* 5.6* 5.6* 5.6*   Liver Function Tests: Recent Labs  Lab 09/04/20 0153 09/05/20 0559 09/06/20 0146 09/07/20 0146 09/08/20 0407  AST 9* 11* 14* 14* 14*  ALT 11 12 14 15 15   ALKPHOS 47 46 48 48 50  BILITOT 1.0 0.7 0.9 0.4 0.6  PROT 6.3* 5.9* 5.8* 5.6* 6.1*  ALBUMIN 2.8* 2.6* 2.5* 2.5* 2.8*   No results for input(s): LIPASE, AMYLASE in the last 168 hours. No results for input(s): AMMONIA in the last 168 hours. CBC: Recent Labs  Lab 09/04/20 0153 09/05/20 0559 09/06/20 0146 09/07/20 0146 09/08/20 0407  WBC 16.9* 5.7 4.9 4.2 6.4  NEUTROABS 16.0* 5.0 3.4 2.6 4.2  HGB 9.2* 8.8* 8.5* 8.1* 8.4*  HCT 27.3* 26.9* 25.7* 24.2* 25.0*  MCV 91.0 91.5 92.1 89.6 89.6  PLT 166 139* 138* 138* 156   Cardiac Enzymes: No results for input(s): CKTOTAL, CKMB, CKMBINDEX, TROPONINI in the last 168 hours. BNP: Invalid input(s): POCBNP CBG: Recent Labs  Lab 09/07/20 1139 09/07/20 1624 09/07/20 2131 09/08/20 0657 09/08/20 1256  GLUCAP 175* 164* 163* 162* 161*   D-Dimer No results for input(s): DDIMER in the last 72 hours. Hgb A1c No results for input(s): HGBA1C in the last 72 hours. Lipid Profile No results for input(s): CHOL, HDL, LDLCALC, TRIG, CHOLHDL, LDLDIRECT in the last 72 hours. Thyroid function studies No results for input(s): TSH, T4TOTAL, T3FREE, THYROIDAB in the last 72 hours.  Invalid input(s): FREET3 Anemia work up No results for  input(s): VITAMINB12, FOLATE, FERRITIN, TIBC, IRON, RETICCTPCT in the last 72 hours. Urinalysis    Component Value Date/Time   COLORURINE YELLOW 09/04/2020 2200   APPEARANCEUR HAZY (A) 09/04/2020 2200   LABSPEC 1.013 09/04/2020 2200   PHURINE 7.0 09/04/2020 2200   GLUCOSEU >=500 (A) 09/04/2020 2200   HGBUR NEGATIVE 09/04/2020 2200   BILIRUBINUR NEGATIVE 09/04/2020 2200   KETONESUR NEGATIVE 09/04/2020 2200   PROTEINUR >=300 (A) 09/04/2020 2200   NITRITE NEGATIVE 09/04/2020 2200   LEUKOCYTESUR NEGATIVE 09/04/2020 2200   Sepsis Labs Invalid input(s): PROCALCITONIN,  WBC,  LACTICIDVEN Microbiology Recent Results (from the past 240 hour(s))  Resp Panel by RT-PCR (Flu A&B, Covid) Nasopharyngeal Swab     Status: None   Collection Time: 08/31/20 11:43 PM   Specimen: Nasopharyngeal Swab; Nasopharyngeal(NP) swabs in vial transport medium  Result Value Ref Range Status   SARS Coronavirus 2 by RT PCR NEGATIVE NEGATIVE Final    Comment: (NOTE) SARS-CoV-2 target nucleic acids are NOT DETECTED.  The SARS-CoV-2 RNA is generally detectable  in upper respiratory specimens during the acute phase of infection. The lowest concentration of SARS-CoV-2 viral copies this assay can detect is 138 copies/mL. A negative result does not preclude SARS-Cov-2 infection and should not be used as the sole basis for treatment or other patient management decisions. A negative result may occur with  improper specimen collection/handling, submission of specimen other than nasopharyngeal swab, presence of viral mutation(s) within the areas targeted by this assay, and inadequate number of viral copies(<138 copies/mL). A negative result must be combined with clinical observations, patient history, and epidemiological information. The expected result is Negative.  Fact Sheet for Patients:  EntrepreneurPulse.com.au  Fact Sheet for Healthcare Providers:   [employer]  This test is no t yet approved or cleared by the [place] FDA and  has been authorized for detection and/or diagnosis of SARS-CoV-2 by FDA under an Emergency Use Authorization (EUA). This EUA will remain  in effect (meaning this test can be used) for the duration of the COVID-19 declaration under Section 564(b)(1) of the Act, 21 U.S.C.section 360bbb-3(b)(1), unless the authorization is terminated  or revoked sooner.       Influenza A by PCR NEGATIVE NEGATIVE Final   Influenza B by PCR NEGATIVE NEGATIVE Final    Comment: (NOTE) The Xpert Xpress SARS-CoV-2/FLU/RSV plus assay is intended as an aid in the diagnosis of influenza from Nasopharyngeal swab specimens and should not be used as a sole basis for treatment. Nasal washings and aspirates are unacceptable for Xpert Xpress SARS-CoV-2/FLU/RSV testing.  Fact Sheet for Patients: EntrepreneurPulse.com.au  Fact Sheet for Healthcare Providers: [employer]  This test is not yet approved or cleared by the [place] FDA and has been authorized for detection and/or diagnosis of SARS-CoV-2 by FDA under an Emergency Use Authorization (EUA). This EUA will remain in effect (meaning this test can be used) for the duration of the COVID-19 declaration under Section 564(b)(1) of the Act, 21 U.S.C. section 360bbb-3(b)(1), unless the authorization is terminated or revoked.  Performed at [facility], [place] [address] [place], [place] [address]   Gastrointestinal Panel by PCR , Stool     Status: None   Collection Time: 09/01/20  4:05 AM   Specimen: Stool  Result Value Ref Range Status   Campylobacter species NOT DETECTED NOT DETECTED Final   Plesimonas shigelloides NOT DETECTED NOT DETECTED Final   Salmonella species NOT DETECTED NOT DETECTED Final   Yersinia enterocolitica NOT DETECTED NOT DETECTED Final   Vibrio species NOT DETECTED  NOT DETECTED Final   Vibrio cholerae NOT DETECTED NOT DETECTED Final   Enteroaggregative E coli (EAEC) NOT DETECTED NOT DETECTED Final   Enteropathogenic E coli (EPEC) NOT DETECTED NOT DETECTED Final   Enterotoxigenic E coli (ETEC) NOT DETECTED NOT DETECTED Final   Shiga like toxin producing E coli (STEC) NOT DETECTED NOT DETECTED Final   Shigella/Enteroinvasive E coli (EIEC) NOT DETECTED NOT DETECTED Final   Cryptosporidium NOT DETECTED NOT DETECTED Final   Cyclospora cayetanensis NOT DETECTED NOT DETECTED Final   Entamoeba histolytica NOT DETECTED NOT DETECTED Final   Giardia lamblia NOT DETECTED NOT DETECTED Final   Adenovirus F40/41 NOT DETECTED NOT DETECTED Final   Astrovirus NOT DETECTED NOT DETECTED Final   Norovirus GI/GII NOT DETECTED NOT DETECTED Final   Rotavirus A NOT DETECTED NOT DETECTED Final   Sapovirus (I, II, IV, and V) NOT DETECTED NOT DETECTED Final    Comment: Performed at [facility], [address] [place], [place] [address]  C  Difficile Quick Screen w PCR reflex     Status: None   Collection Time: 09/01/20 12:03 PM   Specimen: STOOL  Result Value Ref Range Status   C Diff antigen NEGATIVE NEGATIVE Final   C Diff toxin NEGATIVE NEGATIVE Final   C Diff interpretation No C. difficile detected.  Final    Comment: Performed at [facility], [place] [address], [place], [place] [address]  MRSA PCR Screening     Status: Abnormal   Collection Time: 09/01/20 12:15 PM   Specimen: Nasopharyngeal  Result Value Ref Range Status   MRSA by PCR POSITIVE (A) NEGATIVE Final    Comment:        The GeneXpert MRSA Assay (FDA approved for NASAL specimens only), is one component of a comprehensive MRSA colonization surveillance program. It is not intended to diagnose MRSA infection nor to guide or monitor treatment for MRSA infections. RESULT CALLED TO, READ BACK BY AND VERIFIED WITH: A CREWS RN 6389 09/01/20 A BROWNING Performed at [facility],  [place] [address] [place], [place] [address]   Culture, blood (routine x 2)     Status: Abnormal   Collection Time: 09/03/20  6:13 PM   Specimen: BLOOD RIGHT HAND  Result Value Ref Range Status   Specimen Description BLOOD RIGHT HAND  Final   Special Requests   Final    BOTTLES DRAWN AEROBIC ONLY Blood Culture adequate volume   Culture  Setup Time   Final    GRAM POSITIVE COCCI IN CLUSTERS AEROBIC BOTTLE ONLY CRITICAL RESULT CALLED TO, READ BACK BY AND VERIFIED WITH: PHARMD MADISON YATES AT 8768 ON 09/04/20 BY KJ Performed at [facility], [place] [address] [place], [place] [address]    Culture METHICILLIN RESISTANT STAPHYLOCOCCUS AUREUS (A)  Final   Report Status 09/06/2020 FINAL  Final   Organism ID, Bacteria METHICILLIN RESISTANT STAPHYLOCOCCUS AUREUS  Final      Susceptibility   Methicillin resistant staphylococcus aureus - MIC*    CIPROFLOXACIN >=8 RESISTANT Resistant     ERYTHROMYCIN <=0.25 SENSITIVE Sensitive     GENTAMICIN <=0.5 SENSITIVE Sensitive     OXACILLIN >=4 RESISTANT Resistant     TETRACYCLINE <=1 SENSITIVE Sensitive     VANCOMYCIN <=0.5 SENSITIVE Sensitive     TRIMETH/SULFA <=10 SENSITIVE Sensitive     CLINDAMYCIN <=0.25 SENSITIVE Sensitive     RIFAMPIN <=0.5 SENSITIVE Sensitive     Inducible Clindamycin NEGATIVE Sensitive     * METHICILLIN RESISTANT STAPHYLOCOCCUS AUREUS  Blood Culture ID Panel (Reflexed)     Status: Abnormal   Collection Time: 09/03/20  6:13 PM  Result Value Ref Range Status   Enterococcus faecalis NOT DETECTED NOT DETECTED Final   Enterococcus Faecium NOT DETECTED NOT DETECTED Final   Listeria monocytogenes NOT DETECTED NOT DETECTED Final   Staphylococcus species DETECTED (A) NOT DETECTED Final    Comment: CRITICAL RESULT CALLED TO, READ BACK BY AND VERIFIED WITH: PHARMD MADSION YATES AT 1325 ON 09/04/20 BY KJ    Staphylococcus aureus (BCID) DETECTED (A) NOT DETECTED Final    Comment: Methicillin (oxacillin)-resistant Staphylococcus aureus  (MRSA). MRSA is predictably resistant to beta-lactam antibiotics (except ceftaroline). Preferred therapy is vancomycin unless clinically contraindicated. Patient requires contact precautions if  hospitalized. CRITICAL RESULT CALLED TO, READ BACK BY AND VERIFIED WITH: PHARMD MADISON YATES AT 6203 ON 09/04/20 BY KJ    Staphylococcus epidermidis NOT DETECTED NOT DETECTED Final   Staphylococcus lugdunensis NOT DETECTED NOT DETECTED Final  Streptococcus species NOT DETECTED NOT DETECTED Final   Streptococcus agalactiae NOT DETECTED NOT DETECTED Final   Streptococcus pneumoniae NOT DETECTED NOT DETECTED Final   Streptococcus pyogenes NOT DETECTED NOT DETECTED Final   A.calcoaceticus-baumannii NOT DETECTED NOT DETECTED Final   Bacteroides fragilis NOT DETECTED NOT DETECTED Final   Enterobacterales NOT DETECTED NOT DETECTED Final   Enterobacter cloacae complex NOT DETECTED NOT DETECTED Final   Escherichia coli NOT DETECTED NOT DETECTED Final   Klebsiella aerogenes NOT DETECTED NOT DETECTED Final   Klebsiella oxytoca NOT DETECTED NOT DETECTED Final   Klebsiella pneumoniae NOT DETECTED NOT DETECTED Final   Proteus species NOT DETECTED NOT DETECTED Final   Salmonella species NOT DETECTED NOT DETECTED Final   Serratia marcescens NOT DETECTED NOT DETECTED Final   Haemophilus influenzae NOT DETECTED NOT DETECTED Final   Neisseria meningitidis NOT DETECTED NOT DETECTED Final   Pseudomonas aeruginosa NOT DETECTED NOT DETECTED Final   Stenotrophomonas maltophilia NOT DETECTED NOT DETECTED Final   Candida albicans NOT DETECTED NOT DETECTED Final   Candida auris NOT DETECTED NOT DETECTED Final   Candida glabrata NOT DETECTED NOT DETECTED Final   Candida krusei NOT DETECTED NOT DETECTED Final   Candida parapsilosis NOT DETECTED NOT DETECTED Final   Candida tropicalis NOT DETECTED NOT DETECTED Final   Cryptococcus neoformans/gattii NOT DETECTED NOT DETECTED Final   Meth resistant mecA/C and MREJ DETECTED  (A) NOT DETECTED Final    Comment: CRITICAL RESULT CALLED TO, READ BACK BY AND VERIFIED WITH: PHARMD MADISON YATES AT 1325 BY KJ ON 09/03/20 Performed at [facility], [address], [place], [place] [address]   Culture, blood (routine x 2)     Status: Abnormal   Collection Time: 09/03/20  6:15 PM   Specimen: BLOOD RIGHT HAND  Result Value Ref Range Status   Specimen Description BLOOD RIGHT HAND  Final   Special Requests   Final    BOTTLES DRAWN AEROBIC ONLY Blood Culture adequate volume   Culture  Setup Time GRAM POSITIVE COCCI AEROBIC BOTTLE ONLY   Final   Culture (A)  Final    STAPHYLOCOCCUS AUREUS SUSCEPTIBILITIES PERFORMED ON PREVIOUS CULTURE WITHIN THE LAST 5 DAYS. Performed at [facility], [place] [address], [place], [place] [address]    Report Status 09/06/2020 FINAL  Final  Culture, Urine     Status: Abnormal   Collection Time: 09/03/20  9:39 PM   Specimen: Urine, Random  Result Value Ref Range Status   Specimen Description URINE, RANDOM  Final   Special Requests NONE  Final   Culture (A)  Final    <10,000 COLONIES/mL INSIGNIFICANT GROWTH Performed at [facility], [place] [address], [place], [place] [address]    Report Status 09/06/2020 FINAL  Final  Culture, blood (routine x 2)     Status: None (Preliminary result)   Collection Time: 09/05/20  5:59 AM   Specimen: BLOOD  Result Value Ref Range Status   Specimen Description BLOOD RIGHT ANTECUBITAL  Final   Special Requests   Final    BOTTLES DRAWN AEROBIC AND ANAEROBIC Blood Culture adequate volume   Culture   Final    NO GROWTH 3 DAYS Performed at [facility], [place] [address] [place], [place] [address]    Report Status PENDING  Incomplete  Culture, blood (routine x 2)     Status: None (Preliminary result)   Collection Time: 09/05/20  6:02 AM   Specimen: BLOOD  Result Value Ref  Range Status   Specimen Description BLOOD BLOOD RIGHT HAND  Final   Special Requests AEROBIC BOTTLE ONLY Blood  Culture adequate volume  Final   Culture   Final    NO GROWTH 3 DAYS Performed at [facility], [address] [place], [place] [address]    Report Status PENDING  Incomplete   Time coordinating discharge: 35 minutes  SIGNED:  Kerney Elbe, DO [facility] 09/08/2020, 6:27 PM Pager is on [place]  If 7PM-7AM, please contact night-coverage [email or web address]

## 2020-09-08 NOTE — Progress Notes (Signed)
Hemodialysis- System clotted with 30 minutes remaining. Unable to rinse all venous line back. Approx 20-50cc blood loss. Dr. Augustin Coupe notified. Ok to [employer] treatment. Vancomycin switched to patient iv to finish infusion. Reported off to [employer].

## 2020-09-08 NOTE — Progress Notes (Signed)
Pharmacy Antibiotic Note  Scott Castillo is a 60 y.o. male admitted on 08/31/2020 with altered mental status after missing 2 dialysis sessions. Antibiotics being initiated for potential pneumonia.  Pharmacy has been consulted for Vancomycin and Cefepime dosing.  CXR showing worsening pulmonary vascular congestion.  Patient with ESRD on hemodialysis Mondays, Wednesdays, and Fridays. WBC is within normal limits. Blood and urine cultures being sent.  MRSA PCR positive. Cdiff negative.   Patient is currently in hemodialysis today - rapid response was called for hypoxia, shortness of breath and has new fever with Tmax 103. WBC is within normal limits. Blood and urine cultures being sent.  MRSA PCR positive. Cdiff negative.   Pt is now on D6 abx. Blood cultures are now positive for MRSA bacteremia with suspicion for source as IV site. TTE neg for endocarditis. ID plans for 4 wks of abx with vanc.   Plan:  Vanc 1g IV MWF Level as needed  Height: 6' (182.9 cm) Weight: 109.3 kg (240 lb 15.4 oz) IBW/kg (Calculated) : 77.6  Temp (24hrs), Avg:97.9 F (36.6 C), Min:97.6 F (36.4 C), Max:98.2 F (36.8 C)  Recent Labs  Lab 09/04/20 0153 09/04/20 0827 09/05/20 0559 09/06/20 0146 09/07/20 0146 09/08/20 0407  WBC 16.9*  --  5.7 4.9 4.2 6.4  CREATININE 12.99* 8.27* 11.02* 8.18* 10.57* 12.90*    Estimated Creatinine Clearance: 8 mL/min (A) (by C-G formula based on SCr of 12.9 mg/dL (H)).    Allergies  Allergen Reactions  . Heparin Other (See Comments)    Per pt he has episodes where he can't speak  . Pioglitazone Swelling    Site of swelling not recalled by patient  . Adhesive [Tape] Other (See Comments)    Causes redness, pt prefers paper tape   . Latex Rash    Redness, also    Antimicrobials this admission: Vancomycin 5/18 >>6/15 Cefepime 5/18 x1  Dose adjustments this admission:   Microbiology results:  5/16 MRSA PCR - positive 5/16 C.diff - negative 5/16 GI panel PCR -  negative 5/18 UCx - neg 5/18 BCx - MRSA  5/20 blood>>ngtd  Onnie Boer, PharmD, [place], AAHIVP, CPP Infectious Disease Pharmacist 09/08/2020 12:47 PM

## 2020-09-08 NOTE — Evaluation (Signed)
Physical Therapy Evaluation Patient Details Name: Scott Castillo MRN: [id number] DOB: Jul 11, 1960 Today's Date: 09/08/2020   History of Present Illness  Pt is a 60 y.o. M who presents with acute encephalopathy secondary to dialysis dysequilibrium syndrome along with MRSA bacteremia, metabolic acidosis, acute respiratory failure with acute pulmonary edema. Significant PMH: ESRD, DM2, HTN, HLD.  Clinical Impression  Prior to admission, pt lives with his spouse and is independent. Pt presents with decreased functional mobility secondary to weakness, dynamic balance deficits, gait abnormalities, and decreased safety awareness. Pt ambulating x 200 feet with vs without RW. Pt demonstrates improved stability and requires less assist with rolling walker. Education provided regarding DME recommendations and fall prevention techniques I.e. proper footwear, removing obstacles and pets from pathways, using night lights, sitting when showering or performing standing tasks. Pt verbalized understanding. Recommend follow up HHPT to address deficits and maximize functional independence.     Follow Up Recommendations Home health PT;Supervision for mobility/OOB    Equipment Recommendations  None recommended by PT    Recommendations for Other Services       Precautions / Restrictions Precautions Precautions: Fall Restrictions Weight Bearing Restrictions: No      Mobility  Bed Mobility Overal bed mobility: Needs Assistance Bed Mobility: Supine to Sit;Sit to Supine     Supine to sit: Supervision Sit to supine: Supervision   General bed mobility comments: Supervision for safety, no physical assist required    Transfers Overall transfer level: Needs assistance Equipment used: Rolling walker (2 wheeled) Transfers: Sit to/from Stand Sit to Stand: Supervision         General transfer comment: Supervision to rise from edge of bed x 2  Ambulation/Gait Ambulation/Gait assistance: Min guard;Min  assist Gait Distance (Feet): 200 Feet Assistive device: Rolling walker (2 wheeled);None Gait Pattern/deviations: Step-through pattern;Decreased dorsiflexion - right;Drifts right/left Gait velocity: decreased   General Gait Details: Pt initially ambulating first ~150 ft with no AD, demonstrates right foot drop and compensatory gait pattern with hip hiking and circumduction, up to minA for balance, impulsivity with turning and pivots rather than picking up feet. Improvement in balance with RW, progressing to min guard assist level. Cues for walker proximity and upright posture  Stairs            Wheelchair Mobility    Modified Rankin (Stroke Patients Only)       Balance Overall balance assessment: Needs assistance Sitting-balance support: Feet supported Sitting balance-Leahy Scale: Good Sitting balance - Comments: Donning socks/shoes at edge of bed   Standing balance support: No upper extremity supported;During functional activity Standing balance-Leahy Scale: Fair Standing balance comment: Fair statically                             Pertinent Vitals/Pain Pain Assessment: No/denies pain    Home Living Family/patient expects to be discharged to:: Private residence Living Arrangements: Spouse/significant other Available Help at Discharge: Family Type of Home: Mobile home Home Access: Stairs to enter   Entrance Stairs-Number of Steps: 3 Home Layout: One level Home Equipment: [place] - 4 wheels;Cane - single point;Bedside commode;Shower seat;Walker - 2 wheels      Prior Function Level of Independence: Independent         Comments: drives self to dialysis, reports 2 falls in past year     Hand Dominance   Dominant Hand: Right    Extremity/Trunk Assessment   Upper Extremity Assessment Upper Extremity Assessment: Overall WFL for tasks  assessed    Lower Extremity Assessment Lower Extremity Assessment: RLE deficits/detail;LLE deficits/detail RLE  Deficits / Details: 0/5 ankle dorsiflexion (pt reports baseline) RLE Sensation: history of peripheral neuropathy LLE Sensation: history of peripheral neuropathy       Communication   Communication: No difficulties  Cognition Arousal/Alertness: Awake/alert Behavior During Therapy: Impulsive Overall Cognitive Status: Impaired/Different from baseline Area of Impairment: Safety/judgement                         Safety/Judgement: Decreased awareness of safety;Decreased awareness of deficits     General Comments: Requires cues to maintain safety during mobility      General Comments      Exercises     Assessment/Plan    PT Assessment Patient needs continued PT services  PT Problem List Decreased strength;Decreased activity tolerance;Decreased balance;Decreased mobility;Decreased coordination;Decreased safety awareness       PT Treatment Interventions DME instruction;Gait training;Stair training;Therapeutic activities;Functional mobility training;Therapeutic exercise;Balance training;Patient/family education    PT Goals (Current goals can be found in the Care Plan section)  Acute Rehab PT Goals Patient Stated Goal: return home PT Goal Formulation: With patient Time For Goal Achievement: 09/22/20 Potential to Achieve Goals: Good    Frequency Min 3X/week   Barriers to discharge        Co-evaluation               AM-PAC PT "6 Clicks" Mobility  Outcome Measure Help needed turning from your back to your side while in a flat bed without using bedrails?: None Help needed moving from lying on your back to sitting on the side of a flat bed without using bedrails?: A Little Help needed moving to and from a bed to a chair (including a wheelchair)?: A Little Help needed standing up from a chair using your arms (e.g., wheelchair or bedside chair)?: A Little Help needed to walk in hospital room?: A Little Help needed climbing 3-5 steps with a railing? : A Little 6  Click Score: 19    End of Session Equipment Utilized During Treatment: Gait belt Activity Tolerance: Patient tolerated treatment well Patient left: in bed;with call bell/phone within reach;with bed alarm set Nurse Communication: Mobility status PT Visit Diagnosis: Unsteadiness on feet (R26.81);Other abnormalities of gait and mobility (R26.89);Difficulty in walking, not elsewhere classified (R26.2)    Time: 6195-0932 PT Time Calculation (min) (ACUTE ONLY): 19 min   Charges:   PT Evaluation $PT Eval Moderate Complexity: [place], PT, DPT Acute Rehabilitation Services Pager [phone] Office [phone]   Deno Etienne 09/08/2020, 4:46 PM

## 2020-09-08 NOTE — Progress Notes (Addendum)
I have seen and examined the patient. I have personally reviewed the clinical findings, laboratory findings, microbiological data and imaging studies. The assessment and treatment plan was discussed with the  Advance Practice Provider. I agree with her/his recommendations except following additions/corrections.  Continue Vancomycin with HD as is for 4 weeks from 5/20 as planned previously for MRSA bacteremia in the setting of phlebitis. Blood cultures 5/20 no growth in 3 days. TTE is negative for vegetations. End date 10/03/20 Monitor CBC, BMP and Vancomycin trough while on antibiotics  Follow-up with [facility] will be made upon discharge  Scott Castillo, [place] for Infectious [place] for Infectious Disease  Date of Admission:  08/31/2020      Total days of antibiotic: 2  Vancomycin           ASSESSMENT: Scott Castillo is a 60 y.o. male with MRSA bacteremia. Suspect this was due to phlebitis a/w PIV given sudden onset on day 4 of hospitalization and rapid resolution of fevers and leukocytosis after 24 hours of antibiotics. Cleared cultures quickly. TTE negative for any concerning features or surrogate findings for endocarditis.   Continue vancomycin for 4 weeks through 6/15 - he understands the plan and comfortable with that. His fistula looks well and functioning normally. Can follow up with nephrology team and PCP. Happy to see him I the ID clinic if there is any concern.   Will sign off - please call with any questions or concerns.     PLAN: 1. Continue vancomycin after HD with last dose 6/17   Principal Problem:   MRSA bacteremia Active Problems:   Diabetes mellitus type 2 in obese [facility])   Hypertensive urgency   ESRD (end stage renal disease) ([place])   Acute metabolic encephalopathy   Hyperkalemia, diminished renal excretion   Encephalopathy   Acute respiratory failure with hypoxia (HCC)   Phlebitis   .  calcitRIOL  2.25 mcg Oral Q M,W,F-HD  . carvedilol  25 mg Oral BID WC  . Chlorhexidine Gluconate Cloth  6 each Topical Q0600  . darbepoetin (ARANESP) injection - DIALYSIS  60 mcg Intravenous Q Wed-HD  . ferric citrate  630 mg Oral TID WC  . insulin aspart  0-5 Units Subcutaneous QHS  . insulin aspart  0-6 Units Subcutaneous TID WC  . irbesartan  150 mg Oral Daily  . pantoprazole  40 mg Oral Q1200  . pravastatin  80 mg Oral QPM    SUBJECTIVE: Undergoing dialysis today. Worried about poor night of sleep last night but hopeful he can discharge today.    Review of Systems: Review of Systems  Constitutional: Negative for chills, fever and malaise/fatigue.  Respiratory: Negative for cough and shortness of breath.   Cardiovascular: Negative for chest pain.  Gastrointestinal: Negative for abdominal pain, diarrhea, nausea and vomiting.  Musculoskeletal: Negative for back pain, joint pain and myalgias.  Skin: Positive for rash. Itching: chronic stasis dermatitis to B LEs.       Painful old PIV site   Neurological: Negative for dizziness.     Allergies  Allergen Reactions  . Heparin Other (See Comments)    Per pt he has episodes where he can't speak  . Pioglitazone Swelling    Site of swelling not recalled by patient  . Adhesive [Tape] Other (See Comments)    Causes redness, pt prefers paper tape   . Latex Rash    Redness, also  OBJECTIVE: Vitals:   09/08/20 0830 09/08/20 0900 09/08/20 0930 09/08/20 1000  BP: (!) 159/75 (!) 165/91 (!) 177/87 (!) 158/106  Pulse: 76 76 75 (!) 55  Resp:      Temp:      TempSrc:      SpO2:      Weight:      Height:       Body mass index is 33.43 kg/m.  Physical Exam Vitals reviewed.  HENT:     Mouth/Throat:     Mouth: Mucous membranes are moist.     Pharynx: Oropharynx is clear.  Cardiovascular:     Rate and Rhythm: Normal rate and regular rhythm.     Heart sounds: No murmur heard.   Pulmonary:     Effort: Pulmonary effort is  normal.     Breath sounds: Normal breath sounds.  Abdominal:     General: There is no distension.     Tenderness: There is no abdominal tenderness.  Skin:    General: Skin is warm and dry.     Capillary Refill: Capillary refill takes less than 2 seconds.     Comments: Stable appearing chronic stasis dermatitis on B LEs. Swelling 1+. Scattered dry black scabs and linear excoriations.   Neurological:     Mental Status: He is alert and oriented to person, place, and time.  Psychiatric:        Mood and Affect: Mood normal.        Behavior: Behavior normal.   R hand old PIV site improving erythema, still some tenderness. Allevyn foam dressing over the site for comfort. No drainage.    Lab Results Lab Results  Component Value Date   WBC 6.4 09/08/2020   HGB 8.4 (L) 09/08/2020   HCT 25.0 (L) 09/08/2020   MCV 89.6 09/08/2020   PLT 156 09/08/2020    Lab Results  Component Value Date   CREATININE 12.90 (H) 09/08/2020   BUN 79 (H) 09/08/2020   NA 129 (L) 09/08/2020   K 5.2 (H) 09/08/2020   CL 92 (L) 09/08/2020   CO2 24 09/08/2020    Lab Results  Component Value Date   ALT 15 09/08/2020   AST 14 (L) 09/08/2020   ALKPHOS 50 09/08/2020   BILITOT 0.6 09/08/2020     Microbiology: Recent Results (from the past 240 hour(s))  Resp Panel by RT-PCR (Flu A&B, Covid) Nasopharyngeal Swab     Status: None   Collection Time: 08/31/20 11:43 PM   Specimen: Nasopharyngeal Swab; Nasopharyngeal(NP) swabs in vial transport medium  Result Value Ref Range Status   SARS Coronavirus 2 by RT PCR NEGATIVE NEGATIVE Final    Comment: (NOTE) SARS-CoV-2 target nucleic acids are NOT DETECTED.  The SARS-CoV-2 RNA is generally detectable in upper respiratory specimens during the acute phase of infection. The lowest concentration of SARS-CoV-2 viral copies this assay can detect is 138 copies/mL. A negative result does not preclude SARS-Cov-2 infection and should not be used as the sole basis for  treatment or other patient management decisions. A negative result may occur with  improper specimen collection/handling, submission of specimen other than nasopharyngeal swab, presence of viral mutation(s) within the areas targeted by this assay, and inadequate number of viral copies(<138 copies/mL). A negative result must be combined with clinical observations, patient history, and epidemiological information. The expected result is Negative.  Fact Sheet for Patients:  EntrepreneurPulse.com.au  Fact Sheet for Healthcare Providers:  IncredibleEmployment.be  This test is no t yet approved or  cleared by the [place] and  has been authorized for detection and/or diagnosis of SARS-CoV-2 by FDA under an Emergency Use Authorization (EUA). This EUA will remain  in effect (meaning this test can be used) for the duration of the COVID-19 declaration under Section 564(b)(1) of the Act, 21 U.S.C.section 360bbb-3(b)(1), unless the authorization is terminated  or revoked sooner.       Influenza A by PCR NEGATIVE NEGATIVE Final   Influenza B by PCR NEGATIVE NEGATIVE Final    Comment: (NOTE) The Xpert Xpress SARS-CoV-2/FLU/RSV plus assay is intended as an aid in the diagnosis of influenza from Nasopharyngeal swab specimens and should not be used as a sole basis for treatment. Nasal washings and aspirates are unacceptable for Xpert Xpress SARS-CoV-2/FLU/RSV testing.  Fact Sheet for Patients: EntrepreneurPulse.com.au  Fact Sheet for Healthcare Providers: IncredibleEmployment.be  This test is not yet approved or cleared by the [place] FDA and has been authorized for detection and/or diagnosis of SARS-CoV-2 by FDA under an Emergency Use Authorization (EUA). This EUA will remain in effect (meaning this test can be used) for the duration of the COVID-19 declaration under Section 564(b)(1) of the Act, 21  U.S.C. section 360bbb-3(b)(1), unless the authorization is terminated or revoked.  Performed at [facility], [place] [address], [place], [place] [address]   Gastrointestinal Panel by PCR , Stool     Status: None   Collection Time: 09/01/20  4:05 AM   Specimen: Stool  Result Value Ref Range Status   Campylobacter species NOT DETECTED NOT DETECTED Final   Plesimonas shigelloides NOT DETECTED NOT DETECTED Final   Salmonella species NOT DETECTED NOT DETECTED Final   Yersinia enterocolitica NOT DETECTED NOT DETECTED Final   Vibrio species NOT DETECTED NOT DETECTED Final   Vibrio cholerae NOT DETECTED NOT DETECTED Final   Enteroaggregative E coli (EAEC) NOT DETECTED NOT DETECTED Final   Enteropathogenic E coli (EPEC) NOT DETECTED NOT DETECTED Final   Enterotoxigenic E coli (ETEC) NOT DETECTED NOT DETECTED Final   Shiga like toxin producing E coli (STEC) NOT DETECTED NOT DETECTED Final   Shigella/Enteroinvasive E coli (EIEC) NOT DETECTED NOT DETECTED Final   Cryptosporidium NOT DETECTED NOT DETECTED Final   Cyclospora cayetanensis NOT DETECTED NOT DETECTED Final   Entamoeba histolytica NOT DETECTED NOT DETECTED Final   Giardia lamblia NOT DETECTED NOT DETECTED Final   Adenovirus F40/41 NOT DETECTED NOT DETECTED Final   Astrovirus NOT DETECTED NOT DETECTED Final   Norovirus GI/GII NOT DETECTED NOT DETECTED Final   Rotavirus A NOT DETECTED NOT DETECTED Final   Sapovirus (I, II, IV, and V) NOT DETECTED NOT DETECTED Final    Comment: Performed at [facility], [place] [place], [place] [address]  C Difficile Quick Screen w PCR reflex     Status: None   Collection Time: 09/01/20 12:03 PM   Specimen: STOOL  Result Value Ref Range Status   C Diff antigen NEGATIVE NEGATIVE Final   C Diff toxin NEGATIVE NEGATIVE Final   C Diff interpretation No C. difficile detected.  Final    Comment: Performed at [facility], [place] [address], [place], [place] [address]  MRSA PCR  Screening     Status: Abnormal   Collection Time: 09/01/20 12:15 PM   Specimen: Nasopharyngeal  Result Value Ref Range Status   MRSA by PCR POSITIVE (A) NEGATIVE Final    Comment:        The GeneXpert MRSA Assay (FDA approved  for NASAL specimens only), is one component of a comprehensive MRSA colonization surveillance program. It is not intended to diagnose MRSA infection nor to guide or monitor treatment for MRSA infections. RESULT CALLED TO, READ BACK BY AND VERIFIED WITH: A CREWS RN 2536 09/01/20 A BROWNING Performed at [facility], [place] [address] [place], [place] [address]   Culture, blood (routine x 2)     Status: Abnormal   Collection Time: 09/03/20  6:13 PM   Specimen: BLOOD RIGHT HAND  Result Value Ref Range Status   Specimen Description BLOOD RIGHT HAND  Final   Special Requests   Final    BOTTLES DRAWN AEROBIC ONLY Blood Culture adequate volume   Culture  Setup Time   Final    GRAM POSITIVE COCCI IN CLUSTERS AEROBIC BOTTLE ONLY CRITICAL RESULT CALLED TO, READ BACK BY AND VERIFIED WITH: PHARMD MADISON YATES AT 4742 ON 09/04/20 BY KJ Performed at [facility], [place] [address], [place], [place] [address]    Culture METHICILLIN RESISTANT STAPHYLOCOCCUS AUREUS (A)  Final   Report Status 09/06/2020 FINAL  Final   Organism ID, Bacteria METHICILLIN RESISTANT STAPHYLOCOCCUS AUREUS  Final      Susceptibility   Methicillin resistant staphylococcus aureus - MIC*    CIPROFLOXACIN >=8 RESISTANT Resistant     ERYTHROMYCIN <=0.25 SENSITIVE Sensitive     GENTAMICIN <=0.5 SENSITIVE Sensitive     OXACILLIN >=4 RESISTANT Resistant     TETRACYCLINE <=1 SENSITIVE Sensitive     VANCOMYCIN <=0.5 SENSITIVE Sensitive     TRIMETH/SULFA <=10 SENSITIVE Sensitive     CLINDAMYCIN <=0.25 SENSITIVE Sensitive     RIFAMPIN <=0.5 SENSITIVE Sensitive     Inducible Clindamycin NEGATIVE Sensitive     * METHICILLIN RESISTANT STAPHYLOCOCCUS AUREUS  Blood Culture ID Panel (Reflexed)      Status: Abnormal   Collection Time: 09/03/20  6:13 PM  Result Value Ref Range Status   Enterococcus faecalis NOT DETECTED NOT DETECTED Final   Enterococcus Faecium NOT DETECTED NOT DETECTED Final   Listeria monocytogenes NOT DETECTED NOT DETECTED Final   Staphylococcus species DETECTED (A) NOT DETECTED Final    Comment: CRITICAL RESULT CALLED TO, READ BACK BY AND VERIFIED WITH: PHARMD MADSION YATES AT 1325 ON 09/04/20 BY KJ    Staphylococcus aureus (BCID) DETECTED (A) NOT DETECTED Final    Comment: Methicillin (oxacillin)-resistant Staphylococcus aureus (MRSA). MRSA is predictably resistant to beta-lactam antibiotics (except ceftaroline). Preferred therapy is vancomycin unless clinically contraindicated. Patient requires contact precautions if  hospitalized. CRITICAL RESULT CALLED TO, READ BACK BY AND VERIFIED WITH: PHARMD MADISON YATES AT 8756 ON 09/04/20 BY KJ    Staphylococcus epidermidis NOT DETECTED NOT DETECTED Final   Staphylococcus lugdunensis NOT DETECTED NOT DETECTED Final   Streptococcus species NOT DETECTED NOT DETECTED Final   Streptococcus agalactiae NOT DETECTED NOT DETECTED Final   Streptococcus pneumoniae NOT DETECTED NOT DETECTED Final   Streptococcus pyogenes NOT DETECTED NOT DETECTED Final   A.calcoaceticus-baumannii NOT DETECTED NOT DETECTED Final   Bacteroides fragilis NOT DETECTED NOT DETECTED Final   Enterobacterales NOT DETECTED NOT DETECTED Final   Enterobacter cloacae complex NOT DETECTED NOT DETECTED Final   Escherichia coli NOT DETECTED NOT DETECTED Final   Klebsiella aerogenes NOT DETECTED NOT DETECTED Final   Klebsiella oxytoca NOT DETECTED NOT DETECTED Final   Klebsiella pneumoniae NOT DETECTED NOT DETECTED Final   Proteus species NOT DETECTED NOT DETECTED Final   Salmonella species NOT DETECTED NOT DETECTED Final   Serratia marcescens  NOT DETECTED NOT DETECTED Final   Haemophilus influenzae NOT DETECTED NOT DETECTED Final   Neisseria meningitidis NOT  DETECTED NOT DETECTED Final   Pseudomonas aeruginosa NOT DETECTED NOT DETECTED Final   Stenotrophomonas maltophilia NOT DETECTED NOT DETECTED Final   Candida albicans NOT DETECTED NOT DETECTED Final   Candida auris NOT DETECTED NOT DETECTED Final   Candida glabrata NOT DETECTED NOT DETECTED Final   Candida krusei NOT DETECTED NOT DETECTED Final   Candida parapsilosis NOT DETECTED NOT DETECTED Final   Candida tropicalis NOT DETECTED NOT DETECTED Final   Cryptococcus neoformans/gattii NOT DETECTED NOT DETECTED Final   Meth resistant mecA/C and MREJ DETECTED (A) NOT DETECTED Final    Comment: CRITICAL RESULT CALLED TO, READ BACK BY AND VERIFIED WITH: PHARMD MADISON YATES AT 1325 BY KJ ON 09/03/20 Performed at [facility], [place] [address], [place], [place] [address]   Culture, blood (routine x 2)     Status: Abnormal   Collection Time: 09/03/20  6:15 PM   Specimen: BLOOD RIGHT HAND  Result Value Ref Range Status   Specimen Description BLOOD RIGHT HAND  Final   Special Requests   Final    BOTTLES DRAWN AEROBIC ONLY Blood Culture adequate volume   Culture  Setup Time GRAM POSITIVE COCCI AEROBIC BOTTLE ONLY   Final   Culture (A)  Final    STAPHYLOCOCCUS AUREUS SUSCEPTIBILITIES PERFORMED ON PREVIOUS CULTURE WITHIN THE LAST 5 DAYS. Performed at [facility], [place] [address] [place], [place] [address]    Report Status 09/06/2020 FINAL  Final  Culture, Urine     Status: Abnormal   Collection Time: 09/03/20  9:39 PM   Specimen: Urine, Random  Result Value Ref Range Status   Specimen Description URINE, RANDOM  Final   Special Requests NONE  Final   Culture (A)  Final    <10,000 COLONIES/mL INSIGNIFICANT GROWTH Performed at [facility], [place] [address] [place], [place] [address]    Report Status 09/06/2020 FINAL  Final  Culture, blood (routine x 2)     Status: None (Preliminary result)   Collection Time: 09/05/20  5:59 AM   Specimen: BLOOD  Result Value Ref Range  Status   Specimen Description BLOOD RIGHT ANTECUBITAL  Final   Special Requests   Final    BOTTLES DRAWN AEROBIC AND ANAEROBIC Blood Culture adequate volume   Culture   Final    NO GROWTH 3 DAYS Performed at [facility], [place] [address] [place], [place] [address]    Report Status PENDING  Incomplete  Culture, blood (routine x 2)     Status: None (Preliminary result)   Collection Time: 09/05/20  6:02 AM   Specimen: BLOOD  Result Value Ref Range Status   Specimen Description BLOOD BLOOD RIGHT HAND  Final   Special Requests AEROBIC BOTTLE ONLY Blood Culture adequate volume  Final   Culture   Final    NO GROWTH 3 DAYS Performed at [facility], [place] [address], [place], [place] [address]    Report Status PENDING  Incomplete     Janene Madeira, MSN, NP-C [place] for Infectious Disease [email or web address] Pager: [phone] Office: [phone] [place]: [phone]

## 2020-09-08 NOTE — Progress Notes (Addendum)
[facility] Progress Note   Dialysis Orders Rhea [facility]: 4h 29min MWF 400/500 111.5kg 2/2 bath P2 Hep none - venofer 50 /wk - mircera 75 q4, last 4/20 due 5/18 - calcitriol 2.25 ug tiw - home meds: auryxia 2 ac tid    Assessment/ Plan:   1. Acute resp failure - pulm edema, +/- infection, serial HD over 5 days. Repeat CXR improved.  2. MRSA bacteremia  - [facility] 5/18 +Staph aureus   ?source IV site in R hand.  On Vanc.  ID following, No vegetation on TTE. Anticipate 4 weeks of IV vanc per ID.  Repeat BC pending.  - 1st dose of vanc on 5/18; continue through 6/15 when d/c. 1gm with each HD treatment. 3. Hyponatremia - improving with UF on HD.  4. AMS - resolved. Multifactorial etiology - uremia, bacteremia, delirium.  Issues with AMS x2 this week while on HD - unsure of the cause.  No episodes on HD 5/20.  5. ESRD - HD MWF.Had serial HD x 5days.    Next HD on 5/23.  6. HTN/vol - Blood pressure well controlled.  Volume status improving with serial HD, net 10L off so far. Tolerated 3L UF 5/20 w/o issue  Now under dry weight. Lower EDW at discharge.  7. Anemia ckd - Hgb 8.5>8.1 . On aranesp 60ug weekly qWed.  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  Phos elevated (5.6 on 5/22).  Cont vdra, increase Auryxia to 3AC TID. 9. Nutrition - Alb low, renal diet w/fluid restrictions, protein supplements.   Subjective:   Seen in room. No events overnight. No complaints this am.  No cp, sob. Great appetite. Ambulating.   Objective:   BP (!) 175/94 (BP Location: Right Arm)   Pulse 68   Temp 97.6 F (36.4 C) (Oral)   Resp 17   Ht 6' (1.829 m)   Wt 106.2 kg   SpO2 98%   BMI 31.75 kg/m   Intake/Output Summary (Last 24 hours) at 09/08/2020 0701 Last data filed at 09/07/2020 2200 Gross per 24 hour  Intake 600 ml  Output 0 ml  Net 600 ml   Weight change:   Physical Exam: General: chronically ill appearing male in NAD, AAOx3 Heart: RRR, no mrg Lungs: CTAB anterolaterally, nml WOB   Abdomen: soft, NTND Extremities: trace edema, +chronic venous stasis /skin changes.  Dialysis Access: lt Cimino  +bruit   Imaging: No results found.  Labs: BMET Recent Labs  Lab 09/01/20 1758 09/02/20 0134 09/03/20 8413 09/04/20 0153 09/04/20 0827 09/05/20 0559 09/06/20 0146 09/07/20 0146 09/08/20 0407  NA 134*  133*   < > 132* 133* 132* 129* 131* 131* 129*  K 4.6  4.5   < > 4.7 5.2* 4.2 4.4 3.9 4.1 5.2*  CL 96*  96*   < > 97* 98 95* 95* 94* 94* 92*  CO2 22  22   < > 21* 21* 24 23 25 24 24   GLUCOSE 157*  157*   < > 154* 189* 156* 150* 144* 191* 174*  BUN 89*  90*   < > 76* 75* 42* 67* 45* 64* 79*  CREATININE 14.17*  14.27*   < > 12.66* 12.99* 8.27* 11.02* 8.18* 10.57* 12.90*  CALCIUM 8.5*  8.5*   < > 8.5* 8.5* 8.7* 8.7* 8.7* 8.4* 8.8*  PHOS 9.1*  --  9.2* 8.3*  --  7.1* 5.6* 5.6* 5.6*   < > = values in this interval not displayed.   CBC  Recent Labs  Lab 09/05/20 0559 09/06/20 0146 09/07/20 0146 09/08/20 0407  WBC 5.7 4.9 4.2 6.4  NEUTROABS 5.0 3.4 2.6 4.2  HGB 8.8* 8.5* 8.1* 8.4*  HCT 26.9* 25.7* 24.2* 25.0*  MCV 91.5 92.1 89.6 89.6  PLT 139* 138* 138* 156    Medications:    . calcitRIOL  2.25 mcg Oral Q M,W,F-HD  . carvedilol  25 mg Oral BID WC  . Chlorhexidine Gluconate Cloth  6 each Topical Q0600  . darbepoetin (ARANESP) injection - DIALYSIS  60 mcg Intravenous Q Wed-HD  . ferric citrate  630 mg Oral TID WC  . insulin aspart  0-5 Units Subcutaneous QHS  . insulin aspart  0-6 Units Subcutaneous TID WC  . irbesartan  150 mg Oral Daily  . pantoprazole  40 mg Oral Q1200  . pravastatin  80 mg Oral QPM      Otelia Santee, MD 09/08/2020, 7:01 AM

## 2020-09-08 NOTE — Progress Notes (Signed)
DISCHARGE NOTE HOME Scott Castillo to be discharged Home per MD order. Discussed prescriptions and follow up appointments with the patient. Prescriptions given to patient; medication list explained in detail. Patient verbalized understanding.  Skin clean, dry and intact without evidence of skin break down, no evidence of skin tears noted. IV catheter discontinued intact. Site without signs and symptoms of complications. Dressing and pressure applied. Pt denies pain at the site currently. No complaints noted.  Patient free of lines, drains, and wounds.   An After Visit Summary (AVS) was printed and given to the patient. Patient escorted via wheelchair, and discharged home via private auto.  Orville Govern, RN

## 2020-09-10 LAB — CULTURE, BLOOD (ROUTINE X 2)
Culture: NO GROWTH
Culture: NO GROWTH
Special Requests: ADEQUATE
Special Requests: ADEQUATE

## 2020-10-03 ENCOUNTER — Inpatient Hospital Stay: Payer: BC Managed Care – PPO | Admitting: Infectious Diseases

## 2020-10-06 ENCOUNTER — Observation Stay (HOSPITAL_COMMUNITY)
Admission: EM | Admit: 2020-10-06 | Discharge: 2020-10-10 | Disposition: A | Discharge status: Home / Self Care | Payer: Medicare Other | Attending: Internal Medicine | Admitting: Internal Medicine

## 2020-10-06 ENCOUNTER — Emergency Department (HOSPITAL_COMMUNITY): Payer: Medicare Other

## 2020-10-06 DIAGNOSIS — J189 Pneumonia, unspecified organism: Secondary | ICD-10-CM

## 2020-10-06 DIAGNOSIS — Y900 Blood alcohol level of less than 20 mg/100 ml: Secondary | ICD-10-CM | POA: Diagnosis not present

## 2020-10-06 DIAGNOSIS — E1122 Type 2 diabetes mellitus with diabetic chronic kidney disease: Secondary | ICD-10-CM | POA: Diagnosis not present

## 2020-10-06 DIAGNOSIS — I160 Hypertensive urgency: Secondary | ICD-10-CM | POA: Diagnosis not present

## 2020-10-06 DIAGNOSIS — I120 Hypertensive chronic kidney disease with stage 5 chronic kidney disease or end stage renal disease: Secondary | ICD-10-CM | POA: Insufficient documentation

## 2020-10-06 DIAGNOSIS — Z79899 Other long term (current) drug therapy: Secondary | ICD-10-CM | POA: Insufficient documentation

## 2020-10-06 DIAGNOSIS — Z20822 Contact with and (suspected) exposure to covid-19: Secondary | ICD-10-CM | POA: Insufficient documentation

## 2020-10-06 DIAGNOSIS — Z992 Dependence on renal dialysis: Secondary | ICD-10-CM | POA: Insufficient documentation

## 2020-10-06 DIAGNOSIS — G9341 Metabolic encephalopathy: Secondary | ICD-10-CM | POA: Diagnosis not present

## 2020-10-06 DIAGNOSIS — N186 End stage renal disease: Secondary | ICD-10-CM | POA: Insufficient documentation

## 2020-10-06 DIAGNOSIS — R4182 Altered mental status, unspecified: Secondary | ICD-10-CM | POA: Diagnosis present

## 2020-10-06 DIAGNOSIS — E785 Hyperlipidemia, unspecified: Secondary | ICD-10-CM | POA: Diagnosis present

## 2020-10-06 DIAGNOSIS — E669 Obesity, unspecified: Secondary | ICD-10-CM | POA: Diagnosis present

## 2020-10-06 DIAGNOSIS — Z794 Long term (current) use of insulin: Secondary | ICD-10-CM | POA: Insufficient documentation

## 2020-10-06 DIAGNOSIS — R0602 Shortness of breath: Secondary | ICD-10-CM | POA: Diagnosis present

## 2020-10-06 DIAGNOSIS — R4701 Aphasia: Secondary | ICD-10-CM

## 2020-10-06 DIAGNOSIS — Z91040 Latex allergy status: Secondary | ICD-10-CM | POA: Insufficient documentation

## 2020-10-06 DIAGNOSIS — R4702 Dysphasia: Secondary | ICD-10-CM | POA: Diagnosis present

## 2020-10-06 LAB — APTT: aPTT: 29 seconds (ref 24–36)

## 2020-10-06 LAB — CBC
HCT: 27.3 % — ABNORMAL LOW (ref 39.0–52.0)
Hemoglobin: 9 g/dL — ABNORMAL LOW (ref 13.0–17.0)
MCH: 30.5 pg (ref 26.0–34.0)
MCHC: 33 g/dL (ref 30.0–36.0)
MCV: 92.5 fL (ref 80.0–100.0)
Platelets: 229 10*3/uL (ref 150–400)
RBC: 2.95 MIL/uL — ABNORMAL LOW (ref 4.22–5.81)
RDW: 13.5 % (ref 11.5–15.5)
WBC: 7.1 10*3/uL (ref 4.0–10.5)
nRBC: 0 % (ref 0.0–0.2)

## 2020-10-06 LAB — DIFFERENTIAL
Abs Immature Granulocytes: 0.03 10*3/uL (ref 0.00–0.07)
Basophils Absolute: 0.1 10*3/uL (ref 0.0–0.1)
Basophils Relative: 1 %
Eosinophils Absolute: 0.1 10*3/uL (ref 0.0–0.5)
Eosinophils Relative: 2 %
Immature Granulocytes: 0 %
Lymphocytes Relative: 9 %
Lymphs Abs: 0.6 10*3/uL — ABNORMAL LOW (ref 0.7–4.0)
Monocytes Absolute: 0.7 10*3/uL (ref 0.1–1.0)
Monocytes Relative: 10 %
Neutro Abs: 5.6 10*3/uL (ref 1.7–7.7)
Neutrophils Relative %: 78 %

## 2020-10-06 LAB — I-STAT CHEM 8, ED
BUN: 36 mg/dL — ABNORMAL HIGH (ref 6–20)
Calcium, Ion: 0.95 mmol/L — ABNORMAL LOW (ref 1.15–1.40)
Chloride: 95 mmol/L — ABNORMAL LOW (ref 98–111)
Creatinine, Ser: 7.7 mg/dL — ABNORMAL HIGH (ref 0.61–1.24)
Glucose, Bld: 185 mg/dL — ABNORMAL HIGH (ref 70–99)
HCT: 25 % — ABNORMAL LOW (ref 39.0–52.0)
Hemoglobin: 8.5 g/dL — ABNORMAL LOW (ref 13.0–17.0)
Potassium: 4.2 mmol/L (ref 3.5–5.1)
Sodium: 136 mmol/L (ref 135–145)
TCO2: 30 mmol/L (ref 22–32)

## 2020-10-06 LAB — COMPREHENSIVE METABOLIC PANEL
ALT: 13 U/L (ref 0–44)
AST: 17 U/L (ref 15–41)
Albumin: 3.3 g/dL — ABNORMAL LOW (ref 3.5–5.0)
Alkaline Phosphatase: 77 U/L (ref 38–126)
Anion gap: 11 (ref 5–15)
BUN: 35 mg/dL — ABNORMAL HIGH (ref 6–20)
CO2: 29 mmol/L (ref 22–32)
Calcium: 8.9 mg/dL (ref 8.9–10.3)
Chloride: 95 mmol/L — ABNORMAL LOW (ref 98–111)
Creatinine, Ser: 6.74 mg/dL — ABNORMAL HIGH (ref 0.61–1.24)
GFR, Estimated: 9 mL/min — ABNORMAL LOW (ref >60)
Glucose, Bld: 185 mg/dL — ABNORMAL HIGH (ref 70–99)
Potassium: 4.1 mmol/L (ref 3.5–5.1)
Sodium: 135 mmol/L (ref 135–145)
Total Bilirubin: 1.1 mg/dL (ref 0.3–1.2)
Total Protein: 6.4 g/dL — ABNORMAL LOW (ref 6.5–8.1)

## 2020-10-06 LAB — CBG MONITORING, ED: Glucose-Capillary: 180 mg/dL — ABNORMAL HIGH (ref 70–99)

## 2020-10-06 LAB — AMMONIA: Ammonia: 21 umol/L (ref 9–35)

## 2020-10-06 LAB — PROTIME-INR
INR: 1.2 (ref 0.8–1.2)
Prothrombin Time: 15.1 seconds (ref 11.4–15.2)

## 2020-10-06 LAB — ETHANOL: Alcohol, Ethyl (B): <10 mg/dL (ref <10)

## 2020-10-06 LAB — RESP PANEL BY RT-PCR (FLU A&B, COVID) ARPGX2
Influenza A by PCR: NEGATIVE
Influenza B by PCR: NEGATIVE
SARS Coronavirus 2 by RT PCR: NEGATIVE

## 2020-10-06 NOTE — ED Notes (Signed)
Patient transported to MRI 
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
## 2020-10-07 ENCOUNTER — Encounter (HOSPITAL_COMMUNITY): Payer: Self-pay | Admitting: Internal Medicine

## 2020-10-07 ENCOUNTER — Observation Stay (HOSPITAL_COMMUNITY): Payer: Medicare Other

## 2020-10-07 ENCOUNTER — Inpatient Hospital Stay: Payer: BC Managed Care – PPO | Admitting: Infectious Diseases

## 2020-10-07 DIAGNOSIS — E785 Hyperlipidemia, unspecified: Secondary | ICD-10-CM

## 2020-10-07 DIAGNOSIS — E1122 Type 2 diabetes mellitus with diabetic chronic kidney disease: Secondary | ICD-10-CM

## 2020-10-07 DIAGNOSIS — N186 End stage renal disease: Secondary | ICD-10-CM | POA: Diagnosis not present

## 2020-10-07 DIAGNOSIS — I160 Hypertensive urgency: Secondary | ICD-10-CM | POA: Diagnosis not present

## 2020-10-07 DIAGNOSIS — R0602 Shortness of breath: Secondary | ICD-10-CM | POA: Diagnosis not present

## 2020-10-07 DIAGNOSIS — G9341 Metabolic encephalopathy: Secondary | ICD-10-CM | POA: Diagnosis not present

## 2020-10-07 LAB — URINALYSIS, ROUTINE W REFLEX MICROSCOPIC
Bacteria, UA: NONE SEEN
Bilirubin Urine: NEGATIVE
Glucose, UA: >=500 mg/dL — AB
Hgb urine dipstick: NEGATIVE
Ketones, ur: NEGATIVE mg/dL
Leukocytes,Ua: NEGATIVE
Nitrite: NEGATIVE
Protein, ur: >=300 mg/dL — AB
Specific Gravity, Urine: 1.011 (ref 1.005–1.030)
pH: 9 — ABNORMAL HIGH (ref 5.0–8.0)

## 2020-10-07 LAB — CBC WITH DIFFERENTIAL/PLATELET
Abs Immature Granulocytes: 0.03 10*3/uL (ref 0.00–0.07)
Basophils Absolute: 0 10*3/uL (ref 0.0–0.1)
Basophils Relative: 1 %
Eosinophils Absolute: 0.1 10*3/uL (ref 0.0–0.5)
Eosinophils Relative: 2 %
HCT: 25.9 % — ABNORMAL LOW (ref 39.0–52.0)
Hemoglobin: 8.7 g/dL — ABNORMAL LOW (ref 13.0–17.0)
Immature Granulocytes: 0 %
Lymphocytes Relative: 15 %
Lymphs Abs: 1 10*3/uL (ref 0.7–4.0)
MCH: 31 pg (ref 26.0–34.0)
MCHC: 33.6 g/dL (ref 30.0–36.0)
MCV: 92.2 fL (ref 80.0–100.0)
Monocytes Absolute: 0.7 10*3/uL (ref 0.1–1.0)
Monocytes Relative: 10 %
Neutro Abs: 4.9 10*3/uL (ref 1.7–7.7)
Neutrophils Relative %: 72 %
Platelets: 221 10*3/uL (ref 150–400)
RBC: 2.81 MIL/uL — ABNORMAL LOW (ref 4.22–5.81)
RDW: 13.4 % (ref 11.5–15.5)
WBC: 6.8 10*3/uL (ref 4.0–10.5)
nRBC: 0 % (ref 0.0–0.2)

## 2020-10-07 LAB — COMPREHENSIVE METABOLIC PANEL
ALT: 13 U/L (ref 0–44)
AST: 17 U/L (ref 15–41)
Albumin: 3.2 g/dL — ABNORMAL LOW (ref 3.5–5.0)
Alkaline Phosphatase: 86 U/L (ref 38–126)
Anion gap: 12 (ref 5–15)
BUN: 44 mg/dL — ABNORMAL HIGH (ref 6–20)
CO2: 28 mmol/L (ref 22–32)
Calcium: 9 mg/dL (ref 8.9–10.3)
Chloride: 96 mmol/L — ABNORMAL LOW (ref 98–111)
Creatinine, Ser: 7.56 mg/dL — ABNORMAL HIGH (ref 0.61–1.24)
GFR, Estimated: 8 mL/min — ABNORMAL LOW (ref >60)
Glucose, Bld: 149 mg/dL — ABNORMAL HIGH (ref 70–99)
Potassium: 4.2 mmol/L (ref 3.5–5.1)
Sodium: 136 mmol/L (ref 135–145)
Total Bilirubin: 0.8 mg/dL (ref 0.3–1.2)
Total Protein: 6.5 g/dL (ref 6.5–8.1)

## 2020-10-07 LAB — RAPID URINE DRUG SCREEN, HOSP PERFORMED
Amphetamines: NOT DETECTED
Barbiturates: NOT DETECTED
Benzodiazepines: NOT DETECTED
Cocaine: NOT DETECTED
Opiates: NOT DETECTED
Tetrahydrocannabinol: NOT DETECTED

## 2020-10-07 LAB — PHOSPHORUS: Phosphorus: 6.3 mg/dL — ABNORMAL HIGH (ref 2.5–4.6)

## 2020-10-07 LAB — C-REACTIVE PROTEIN: CRP: 1 mg/dL — ABNORMAL HIGH (ref <1.0)

## 2020-10-07 LAB — CBG MONITORING, ED
Glucose-Capillary: 166 mg/dL — ABNORMAL HIGH (ref 70–99)
Glucose-Capillary: 166 mg/dL — ABNORMAL HIGH (ref 70–99)
Glucose-Capillary: 164 mg/dL — ABNORMAL HIGH (ref 70–99)

## 2020-10-07 LAB — PROTIME-INR
INR: 1.2 (ref 0.8–1.2)
Prothrombin Time: 15.1 seconds (ref 11.4–15.2)

## 2020-10-07 LAB — GLUCOSE, CAPILLARY: Glucose-Capillary: 181 mg/dL — ABNORMAL HIGH (ref 70–99)

## 2020-10-07 LAB — PROCALCITONIN: Procalcitonin: 0.17 ng/mL

## 2020-10-07 LAB — APTT: aPTT: 28 seconds (ref 24–36)

## 2020-10-07 LAB — RPR: RPR Ser Ql: NONREACTIVE

## 2020-10-07 MED ORDER — ACETAMINOPHEN 650 MG RE SUPP: 650.0000 mg | Freq: Four times a day (QID) | RECTAL | Status: DC | PRN

## 2020-10-07 MED ORDER — CHLORHEXIDINE GLUCONATE CLOTH 2 % EX PADS: 6.0000 | MEDICATED_PAD | Freq: Every day | CUTANEOUS | Status: DC

## 2020-10-07 MED ORDER — ALTEPLASE 2 MG IJ SOLR: 2.0000 mg | Freq: Once | INTRAMUSCULAR | Status: DC | PRN

## 2020-10-07 MED ORDER — PANTOPRAZOLE SODIUM 40 MG PO TBEC
40.0000 mg | DELAYED_RELEASE_TABLET | Freq: Every day | ORAL | Status: DC
  Administered 2020-10-07 – 2020-10-09 (×3): 40 mg via ORAL
  Filled 2020-10-07 (×3): qty 1

## 2020-10-07 MED ORDER — CALCITRIOL 0.5 MCG PO CAPS
2.2500 ug | ORAL_CAPSULE | ORAL | Status: DC
  Administered 2020-10-08: 2.25 ug via ORAL

## 2020-10-07 MED ORDER — LIDOCAINE HCL (PF) 1 % IJ SOLN: 5.0000 mL | INTRAMUSCULAR | Status: DC | PRN

## 2020-10-07 MED ORDER — LIDOCAINE-PRILOCAINE 2.5-2.5 % EX CREA: 1.0000 "application " | TOPICAL_CREAM | CUTANEOUS | Status: DC | PRN

## 2020-10-07 MED ORDER — INSULIN ASPART 100 UNIT/ML IJ SOLN
0.0000 [IU] | Freq: Three times a day (TID) | INTRAMUSCULAR | Status: DC
  Administered 2020-10-07 (×3): 2 [IU] via SUBCUTANEOUS
  Administered 2020-10-08: 3 [IU] via SUBCUTANEOUS
  Administered 2020-10-08 (×2): 1 [IU] via SUBCUTANEOUS
  Administered 2020-10-08 – 2020-10-09 (×2): 2 [IU] via SUBCUTANEOUS
  Administered 2020-10-09: 1 [IU] via SUBCUTANEOUS
  Administered 2020-10-09: 2 [IU] via SUBCUTANEOUS
  Administered 2020-10-10: 1 [IU] via SUBCUTANEOUS

## 2020-10-07 MED ORDER — SODIUM CHLORIDE 0.9 % IV SOLN: 100.0000 mL | INTRAVENOUS | Status: DC | PRN

## 2020-10-07 MED ORDER — PRAVASTATIN SODIUM 40 MG PO TABS
80.0000 mg | ORAL_TABLET | Freq: Every evening | ORAL | Status: DC
  Administered 2020-10-07 – 2020-10-09 (×3): 80 mg via ORAL
  Filled 2020-10-07 (×3): qty 2

## 2020-10-07 MED ORDER — ONDANSETRON HCL 4 MG PO TABS: 4.0000 mg | ORAL_TABLET | Freq: Four times a day (QID) | ORAL | Status: DC | PRN

## 2020-10-07 MED ORDER — ONDANSETRON HCL 4 MG/2ML IJ SOLN: 4.0000 mg | Freq: Four times a day (QID) | INTRAMUSCULAR | Status: DC | PRN

## 2020-10-07 MED ORDER — IRBESARTAN 75 MG PO TABS
150.0000 mg | ORAL_TABLET | Freq: Every day | ORAL | Status: DC
  Administered 2020-10-07 – 2020-10-09 (×3): 150 mg via ORAL
  Filled 2020-10-07: qty 1
  Filled 2020-10-07 (×2): qty 2
  Filled 2020-10-07: qty 1

## 2020-10-07 MED ORDER — PENTAFLUOROPROP-TETRAFLUOROETH EX AERO: 1.0000 "application " | INHALATION_SPRAY | CUTANEOUS | Status: DC | PRN

## 2020-10-07 MED ORDER — POLYETHYLENE GLYCOL 3350 17 G PO PACK: 17.0000 g | PACK | Freq: Every day | ORAL | Status: DC | PRN

## 2020-10-07 MED ORDER — ACETAMINOPHEN 325 MG PO TABS: 650.0000 mg | ORAL_TABLET | Freq: Four times a day (QID) | ORAL | Status: DC | PRN

## 2020-10-07 MED ORDER — HYDRALAZINE HCL 20 MG/ML IJ SOLN: 10.0000 mg | Freq: Four times a day (QID) | INTRAMUSCULAR | Status: DC | PRN

## 2020-10-07 MED ORDER — TORSEMIDE 100 MG PO TABS
100.0000 mg | ORAL_TABLET | ORAL | Status: DC
  Administered 2020-10-07 – 2020-10-09 (×2): 100 mg via ORAL
  Filled 2020-10-07: qty 1
  Filled 2020-10-07: qty 5

## 2020-10-07 MED ORDER — CARVEDILOL 25 MG PO TABS
25.0000 mg | ORAL_TABLET | Freq: Two times a day (BID) | ORAL | Status: DC
  Administered 2020-10-07 – 2020-10-09 (×6): 25 mg via ORAL
  Filled 2020-10-07: qty 2
  Filled 2020-10-07 (×2): qty 1
  Filled 2020-10-07: qty 8
  Filled 2020-10-07 (×2): qty 1

## 2020-10-07 NOTE — ED Notes (Signed)
Breakfast Orders placed 

## 2020-10-07 NOTE — ED Notes (Signed)
One set of cultures obtained 

## 2020-10-07 NOTE — Care Plan (Signed)
This is a 60 year old male that was admitted due to confusion after hemodialysis yesterday.  He was admitted for encephalopathy/encephalitis.  He has end-stage renal disease on hemodialysis Monday Wednesday and Friday.  Patient seen and examined in the ED He stated he feels much better.  He informed me he is due for hemodialysis tomorrow and if he still here that they need to make arrangement for him to have hemodialysis. I will consult nephrology for hemodialysis
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
## 2020-10-07 NOTE — ED Notes (Signed)
On assessment, patient is A/O and answers questions appropriately. Patient is resting on stretcher with HOB elevated, SR up X 2 for safety. Patient is able to use call bell as TV remote. MAEWP, EOMs intact, speech is clear. Skin is warm and dry, color WNL.
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
## 2020-10-07 NOTE — ED Notes (Signed)
Pt put on 2 l [place]

## 2020-10-08 ENCOUNTER — Observation Stay (HOSPITAL_BASED_OUTPATIENT_CLINIC_OR_DEPARTMENT_OTHER): Payer: Medicare Other

## 2020-10-08 DIAGNOSIS — G9341 Metabolic encephalopathy: Secondary | ICD-10-CM

## 2020-10-08 DIAGNOSIS — E669 Obesity, unspecified: Secondary | ICD-10-CM | POA: Diagnosis not present

## 2020-10-08 DIAGNOSIS — R4702 Dysphasia: Secondary | ICD-10-CM | POA: Diagnosis present

## 2020-10-08 DIAGNOSIS — N186 End stage renal disease: Secondary | ICD-10-CM | POA: Diagnosis not present

## 2020-10-08 LAB — GLUCOSE, CAPILLARY
Glucose-Capillary: 166 mg/dL — ABNORMAL HIGH (ref 70–99)
Glucose-Capillary: 148 mg/dL — ABNORMAL HIGH (ref 70–99)
Glucose-Capillary: 203 mg/dL — ABNORMAL HIGH (ref 70–99)
Glucose-Capillary: 148 mg/dL — ABNORMAL HIGH (ref 70–99)

## 2020-10-08 MED ORDER — CALCITRIOL 0.5 MCG PO CAPS
ORAL_CAPSULE | ORAL | Status: AC
  Filled 2020-10-08: qty 4

## 2020-10-08 MED ORDER — CALCITRIOL 0.25 MCG PO CAPS
ORAL_CAPSULE | ORAL | Status: AC
  Filled 2020-10-08: qty 1

## 2020-10-08 NOTE — Procedures (Signed)
I was present at this dialysis session. I have reviewed the session itself and made appropriate changes.   No further confusion.  This AM w/o c/o. 2K bath, 2L UF goal using AVF.  As prevoiusly stated, I do not think that is episodic confusion is dialysis dysequilibrium.  COnt current plan. OK for DC today post HD   [facility] Weights   10/06/20 2031 10/08/20 0822  Weight: 104.3 kg 108.8 kg    Recent Labs  Lab 10/07/20 0317  NA 136  K 4.2  CL 96*  CO2 28  GLUCOSE 149*  BUN 44*  CREATININE 7.56*  CALCIUM 9.0  PHOS 6.3*    Recent Labs  Lab 10/06/20 2036 10/06/20 2052 10/07/20 0441  WBC 7.1  --  6.8  NEUTROABS 5.6  --  4.9  HGB 9.0* 8.5* 8.7*  HCT 27.3* 25.0* 25.9*  MCV 92.5  --  92.2  PLT 229  --  221    Scheduled Meds:  calcitRIOL  2.25 mcg Oral Q M,W,F-HD   carvedilol  25 mg Oral BID WC   Chlorhexidine Gluconate Cloth  6 each Topical Q0600   insulin aspart  0-9 Units Subcutaneous TID AC & HS   irbesartan  150 mg Oral Daily   pantoprazole  40 mg Oral Q1200   pravastatin  80 mg Oral QPM   torsemide  100 mg Oral Once per day on Sun Tue Thu Sat   Continuous Infusions:  sodium chloride     sodium chloride     PRN Meds:.sodium chloride, sodium chloride, acetaminophen **OR** acetaminophen, alteplase, hydrALAZINE, lidocaine (PF), lidocaine-prilocaine, ondansetron **OR** ondansetron (ZOFRAN) IV, pentafluoroprop-tetrafluoroeth, polyethylene glycol   Pearson Grippe  MD 10/08/2020, 10:10 AM

## 2020-10-08 NOTE — Plan of Care (Signed)
  Problem: Health Behavior/Discharge Planning: Goal: Ability to manage health-related needs will improve Outcome: Progressing   Problem: Clinical Measurements: Goal: Ability to maintain clinical measurements within normal limits will improve Outcome: Progressing   
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
## 2020-10-09 DIAGNOSIS — G9341 Metabolic encephalopathy: Secondary | ICD-10-CM | POA: Diagnosis not present

## 2020-10-09 DIAGNOSIS — N186 End stage renal disease: Secondary | ICD-10-CM | POA: Diagnosis not present

## 2020-10-09 DIAGNOSIS — E669 Obesity, unspecified: Secondary | ICD-10-CM | POA: Diagnosis not present

## 2020-10-09 LAB — GLUCOSE, CAPILLARY
Glucose-Capillary: 169 mg/dL — ABNORMAL HIGH (ref 70–99)
Glucose-Capillary: 134 mg/dL — ABNORMAL HIGH (ref 70–99)
Glucose-Capillary: 198 mg/dL — ABNORMAL HIGH (ref 70–99)
Glucose-Capillary: 139 mg/dL — ABNORMAL HIGH (ref 70–99)

## 2020-10-09 MED ORDER — DARBEPOETIN ALFA 40 MCG/0.4ML IJ SOSY: 40.0000 ug | PREFILLED_SYRINGE | INTRAMUSCULAR | Status: DC

## 2020-10-09 MED ORDER — FERRIC CITRATE 1 GM 210 MG(FE) PO TABS
420.0000 mg | ORAL_TABLET | Freq: Three times a day (TID) | ORAL | Status: DC
  Administered 2020-10-09: 420 mg via ORAL
  Filled 2020-10-09: qty 2

## 2020-10-09 MED ORDER — CHLORHEXIDINE GLUCONATE CLOTH 2 % EX PADS: 6.0000 | MEDICATED_PAD | Freq: Every day | CUTANEOUS | Status: DC

## 2020-10-09 NOTE — Progress Notes (Addendum)
[facility] Progress Note   Subjective:     Patient seen and examined at bedside. No complaints-denies SOB, CP, ABD pain, N/V/D. Completed yesterday's HD with net UF 1L. Reports his legs felt mildly restless-denies cramping during treatment. Plan for HD 6/24 if patient is still here.  Objective Vitals:   10/08/20 1323 10/08/20 1754 10/08/20 2101 10/09/20 0446  BP: (!) 154/85 (!) 152/88 (!) 160/96 (!) 145/85  Pulse: 81 84 82 79  Resp: 16 19 17 18   Temp: (!) 97.3 F (36.3 C) 97.8 F (36.6 C) 98 F (36.7 C) (!) 97.5 F (36.4 C)  TempSrc: Oral Oral Oral Oral  SpO2: 95% 94% 96% 93%  Weight:   108.8 kg   Height:       Physical Exam General:Well appearing; NAD Heart: Normal S1 and S2; No murmurs, rubs, gallops Lungs:Clear throughout, no wheezing, rales, or rhonchi Abdomen:Soft, non-tender Extremities:No edema BLLE Dialysis Access: L AVF   Filed Weights   10/08/20 0822 10/08/20 1220 10/08/20 2101  Weight: 108.8 kg 107.8 kg 108.8 kg    Intake/Output Summary (Last 24 hours) at 10/09/2020 0828 Last data filed at 10/09/2020 0600 Gross per 24 hour  Intake 960 ml  Output 1500 ml  Net -540 ml    Additional Objective Labs: Basic Metabolic Panel: Recent Labs  Lab 10/06/20 2036 10/06/20 2052 10/07/20 0317  NA 135 136 136  K 4.1 4.2 4.2  CL 95* 95* 96*  CO2 29  --  28  GLUCOSE 185* 185* 149*  BUN 35* 36* 44*  CREATININE 6.74* 7.70* 7.56*  CALCIUM 8.9  --  9.0  PHOS  --   --  6.3*   Liver Function Tests: Recent Labs  Lab 10/06/20 2036 10/07/20 0317  AST 17 17  ALT 13 13  ALKPHOS 77 86  BILITOT 1.1 0.8  PROT 6.4* 6.5  ALBUMIN 3.3* 3.2*   No results for input(s): LIPASE, AMYLASE in the last 168 hours. CBC: Recent Labs  Lab 10/06/20 2036 10/06/20 2052 10/07/20 0441  WBC 7.1  --  6.8  NEUTROABS 5.6  --  4.9  HGB 9.0* 8.5* 8.7*  HCT 27.3* 25.0* 25.9*  MCV 92.5  --  92.2  PLT 229  --  221   Blood Culture    Component Value Date/Time    SDES BLOOD RIGHT ANTECUBITAL 10/07/2020 0318   SDES BLOOD RIGHT FOREARM 10/07/2020 0318   SPECREQUEST  10/07/2020 0318    BOTTLES DRAWN AEROBIC AND ANAEROBIC Blood Culture adequate volume   SPECREQUEST  10/07/2020 0318    BOTTLES DRAWN AEROBIC AND ANAEROBIC Blood Culture adequate volume   CULT  10/07/2020 0318    NO GROWTH 1 DAY Performed at [facility] [address] [place], [place] [address]    CULT  10/07/2020 0318    NO GROWTH 1 DAY Performed at [place] [address] [place], [place] [address]    REPTSTATUS PENDING 10/07/2020 0318   REPTSTATUS PENDING 10/07/2020 0318    Cardiac Enzymes: No results for input(s): CKTOTAL, CKMB, CKMBINDEX, TROPONINI in the last 168 hours. CBG: Recent Labs  Lab 10/08/20 0643 10/08/20 1328 10/08/20 1722 10/08/20 2116 10/09/20 0636  GLUCAP 166* 148* 203* 148* 169*   Iron Studies: No results for input(s): IRON, TIBC, TRANSFERRIN, FERRITIN in the last 72 hours. Lab Results  Component Value Date   INR 1.2 10/07/2020   INR 1.2 10/06/2020   INR 1.2 06/16/2020   Studies/Results: VAS US CAROTID  Result Date: 10/08/2020 Carotid Arterial Duplex Study Patient Name:  MUZAMIL HARKER Devoss  Date of Exam:   10/08/2020 Medical Rec #: [id number]        Accession #:    [id number] Date of Birth: May 29, 1960        Patient Gender: M Patient Age:   59Y Exam Location:  [facility] Procedure:      VAS US CAROTID Referring Phys: [id number] Cisco --------------------------------------------------------------------------------  Indications:       Acute encephalopathy. Risk Factors:      Hypertension, hyperlipidemia, Diabetes, no history of                    smoking. Other Factors:     Venous stasis BLE, ESRD(HD). Comparison Study:  No previous exams Performing Technologist: Jody Hill RVT, RDMS  Examination Guidelines: A complete evaluation includes B-mode imaging, spectral Doppler, color Doppler, and power Doppler as needed of all  accessible portions of each vessel. Bilateral testing is considered an integral part of a complete examination. Limited examinations for reoccurring indications may be performed as noted.  Right Carotid Findings: +----------+--------+--------+--------+------------------+------------------+           PSV cm/sEDV cm/sStenosisPlaque DescriptionComments           +----------+--------+--------+--------+------------------+------------------+ CCA Prox  75      14                                                   +----------+--------+--------+--------+------------------+------------------+ CCA Distal62      11                                intimal thickening +----------+--------+--------+--------+------------------+------------------+ ICA Prox  56      17                                                   +----------+--------+--------+--------+------------------+------------------+ ICA Distal65      19                                                   +----------+--------+--------+--------+------------------+------------------+ ECA       73      5                                                    +----------+--------+--------+--------+------------------+------------------+ +----------+--------+-------+----------------+-------------------+           PSV cm/sEDV cmsDescribe        Arm Pressure (mmHG) +----------+--------+-------+----------------+-------------------+ [id number]             Multiphasic, WNL                    +----------+--------+-------+----------------+-------------------+ +---------+--------+--+--------+--+---------+ VertebralPSV cm/s39EDV cm/s12Antegrade +---------+--------+--+--------+--+---------+  Left Carotid Findings: +----------+--------+--------+--------+------------------+------------------+           PSV cm/sEDV cm/sStenosisPlaque DescriptionComments            +----------+--------+--------+--------+------------------+------------------+  CCA Prox  87      10                                intimal thickening +----------+--------+--------+--------+------------------+------------------+ CCA Distal72      10                                intimal thickening +----------+--------+--------+--------+------------------+------------------+ ICA Prox  103     30                                                   +----------+--------+--------+--------+------------------+------------------+ ICA Distal105     30                                                   +----------+--------+--------+--------+------------------+------------------+ ECA       73      0                                                    +----------+--------+--------+--------+------------------+------------------+ +----------+--------+--------+-----------------------------+-------------------+           PSV cm/sEDV cm/sDescribe                     Arm Pressure (mmHG) +----------+--------+--------+-----------------------------+-------------------+ [id number]     45      Elevated velocities without                                                evidence of stenosis.                            +----------+--------+--------+-----------------------------+-------------------+ +---------+--------+--+--------+--+---------+ VertebralPSV cm/s35EDV cm/s10Antegrade +---------+--------+--+--------+--+---------+   Summary: Right Carotid: The extracranial vessels were near-normal with only minimal wall                thickening or plaque. Left Carotid: The extracranial vessels were near-normal with only minimal wall               thickening or plaque. Vertebrals:  Bilateral vertebral arteries demonstrate antegrade flow. Subclavians: Normal flow hemodynamics were seen in the right subclavian artery.              Left subclavian show elevated velocities without evidence of               stenosis. *See table(s) above for measurements and observations.     Preliminary     Medications:   calcitRIOL  2.25 mcg Oral Q M,W,F-HD   carvedilol  25 mg Oral BID WC   Chlorhexidine Gluconate Cloth  6 each Topical Q0600   insulin aspart  0-9 Units Subcutaneous TID AC & HS   irbesartan  150 mg Oral Daily   pantoprazole  40 mg Oral Q1200   pravastatin  80 mg Oral  QPM   torsemide  100 mg Oral Once per day on Sun Tue Thu Sat    Dialysis Orders: MWF - [facility]  4hrs/73min BFR 400, DFR 800,  EDW:108.5kg, 2K/ 2Ca   Assessment/Plan: Acute Metabolic Encephalopathy- his mental status appears to be improving. Blood cxs 6/21 (-), RFP (-), COVID (-), Head CT and MRI (-).  ESRD - on HD MWF; tolerated yesterday's HD th net UF 1L-plan for HD 6/24 if patient is still here  Hypertension/volume patient euvolemic on exam-BP mildly elevated this morning-continue Carvedilol, Avapro, Torsemide, and Hydralazine   Anemia of CKD Hgb now 8.7-last ESA given on 09/19/20-will resume here if patient is still here. Otherwise will continue SA per protocol in outpatient.  Secondary Hyperparathyroidism -  Corr Ca ok, PO4 slightly high-will resume binder.  Nutrition - Renal diet with fluid restriction.  Tobie Poet, NP [facility] 10/09/2020,8:28 AM  LOS: 0 days

## 2020-10-09 NOTE — Plan of Care (Signed)
  Problem: Education: Goal: Knowledge of General Education information will improve Description Including pain rating scale, medication(s)/side effects and non-pharmacologic comfort measures Outcome: Progressing   Problem: Health Behavior/Discharge Planning: Goal: Ability to manage health-related needs will improve Outcome: Progressing   

## 2020-10-09 NOTE — Plan of Care (Signed)
  Problem: Activity: Goal: Risk for activity intolerance will decrease Outcome: Progressing   Problem: Nutrition: Goal: Adequate nutrition will be maintained Outcome: Progressing   
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
## 2020-10-09 NOTE — Consult Note (Signed)
Neurology Consultation Reason for Consult: Episodes of altered mental status Referring Physician: Durene Romans  CC: Episodes of altered mental status  History is obtained from: Patient, chart review  HPI: Scott Castillo is a 60 y.o. male with a history of diabetes and end-stage renal failure who presents with an episode of altered mental status.  He has had multiple episodes of this, he estimates somewhere between 12 and 20 going back to it least February.  They typically occur after dialysis, or during dialysis.  He becomes confused, sometimes agitated, and aphasic.  They typically are relatively short-lived, lasting less than a few hours, but symptoms have lasted longer, even with one event during which I met him previously lasting for several days.  He is having worked up extensively with MRI in March as well as June, CT angio head and neck in February, as well as continuous EEG during one of his aphasic episodes in May.  Given that these happened so consistently with hemodialysis, there has been thought of these could be related to dialysis disequilibrium syndrome.  He thinks that he may have had one of these episodes on a day that was not associated with hemodialysis.  With a least one of these episodes in the past, he has had right-sided weakness as well.  With his most recent episode, he also states that he had severe restless legs, this was bilateral and was simply of an internal sensation of restlessness, no twitching at all.  He also has complaint of bilateral numbness with these episodes in the past as well.  He currently feels like he is back near to baseline.    ROS: A 14 point ROS was performed and is negative except as noted in the HPI.  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 He has never used smokeless tobacco. He reports that he does not drink alcohol and does not use drugs.   Exam: Current vital signs: BP (!) 156/96 (BP Location: Right Arm)   Pulse 77   Temp 98.3 F (36.8 C) (Oral)   Resp 16   Ht 6' (1.829 m)   Wt 108.8 kg   SpO2 99%   BMI 32.53 kg/m  Vital signs in last 24 hours: Temp:  [97.4 F (36.3 C)-98.3 F (36.8 C)] 98.3 F (36.8 C) (06/23 2115) Pulse Rate:  [72-79] 77 (06/23 2115) Resp:  [16-18] 16 (06/23 2115) BP: (145-156)/(85-96) 156/96 (06/23 2115) SpO2:  [92 %-99 %] 99 % (06/23 2115)   Physical Exam  Constitutional: Appears well-developed and well-nourished.  Psych: Affect appropriate to situation Eyes: No scleral injection HENT: No OP obstruction MSK: no joint deformities.  Cardiovascular: Normal rate and regular rhythm.  Respiratory: Effort normal, non-labored breathing GI: Soft.  No distension. There is no tenderness.  Skin: WDI  Neuro: Mental Status: Patient is awake, alert, oriented to person, place, month, year, and situation. Patient is able to give a clear and coherent history. No signs of neglect Though he occasionally has some latency of speech, he has no true difficulty with naming, repetition or other clear evidence of aphasia.  He is able to spell world backwards without difficulty. Cranial Nerves: II: Visual Fields are full. Pupils are equal, round, and reactive to light.   III,IV, VI: EOMI without ptosis or diploplia.  V: Facial sensation is symmetric to temperature VII: Facial movement is symmetric.  VIII: hearing is intact to  voice X: Uvula elevates symmetrically XI: Shoulder shrug is symmetric. XII: tongue is midline without atrophy or fasciculations.  Motor: Tone is normal. Bulk is normal. 5/5 strength was present in all four extremities.  Sensory: Sensation is symmetric to light touch and temperature in the arms and legs. Cerebellar: No clear ataxia  I have reviewed labs in epic and the results pertinent to this consultation are:  BUN from admission 35 Ammonia 21 CRP borderline at 1.0 Procalcitonin negative Sodium 136 Potassium 4.2 Calcium 9.0 Normal LFTs  I have reviewed the images obtained: MRI brain-negative  Impression: 60 year old male with a history of recurrent episodes of aphasia, confusion almost always occurring during dialysis.  He does report a single episode that might have occurred outside of dialysis, and if this is true then it makes typical dialysis disequilibrium less likely.  In that setting, he may be sensitive to some type of metabolic disequilibrium to which dialysis predisposes such as sodium fluctuations, blood pressure fluctuations, or osmolar shifts.   The fact that it occurs so reliably associated with dialysis would make etiologies such as stroke, seizure, autoimmune encephalitis, or other causes of focal neurological deficits much less likely.  It is known that ESRD can lead to impaired cerebral autoregulation(e.g. pres) but this has not been seen on the MRIs that have been obtained.  Symptoms hypertension has been associated with his presentations in the past, but not always.  Overall I am not certain that we have any further diagnostic testing which is likely to be of much benefit.  I have seen adrenal insufficiency result and aphasia in the past, but this is more of a persistent deficit but I still think it may be reasonable to check an a.m. cortisol.  Recommendations: 1) a.m. cortisol 2) if negative, I am not certain that we have a lot to offer beyond trying to pursue  as gentle dialysis as possible.   Roland Rack, MD [facility] [phone]  If 7pm- 7am, please page neurology on call as listed in [place].
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
## 2020-10-10 DIAGNOSIS — N186 End stage renal disease: Secondary | ICD-10-CM | POA: Diagnosis not present

## 2020-10-10 DIAGNOSIS — I160 Hypertensive urgency: Secondary | ICD-10-CM | POA: Diagnosis not present

## 2020-10-10 DIAGNOSIS — E669 Obesity, unspecified: Secondary | ICD-10-CM | POA: Diagnosis not present

## 2020-10-10 DIAGNOSIS — G9341 Metabolic encephalopathy: Secondary | ICD-10-CM | POA: Diagnosis not present

## 2020-10-10 LAB — GLUCOSE, CAPILLARY: Glucose-Capillary: 135 mg/dL — ABNORMAL HIGH (ref 70–99)

## 2020-10-10 LAB — CORTISOL-AM, BLOOD: Cortisol - AM: 23.5 ug/dL — ABNORMAL HIGH (ref 6.7–22.6)
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
## 2020-10-10 NOTE — Progress Notes (Addendum)
[facility] Progress Note   Subjective: Seen in room. No C/Os this AM. Says he thinks he may go home post HD today. Mental status at baseline. Seen on the way out the door  Objective Vitals:   10/09/20 2114 10/09/20 2115 10/10/20 0523 10/10/20 0524  BP: (!) 156/89 (!) 156/96 (!) 158/93   Pulse: 78 77 79   Resp:  16  14  Temp:  98.3 F (36.8 C) (!) 97.5 F (36.4 C)   TempSrc:  Oral Oral   SpO2: 94% 99% 95%   Weight:      Height:       Physical Exam General: Pleasant older male in NAD Heart: S1,S2 RRR No M/R/G Lungs: CTAB Abdomen: S, NT Extremities:No LE edema Dialysis Access: L AVF +T/B  Additional Objective Labs: Basic Metabolic Panel: Recent Labs  Lab 10/06/20 2036 10/06/20 2052 10/07/20 0317  NA 135 136 136  K 4.1 4.2 4.2  CL 95* 95* 96*  CO2 29  --  28  GLUCOSE 185* 185* 149*  BUN 35* 36* 44*  CREATININE 6.74* 7.70* 7.56*  CALCIUM 8.9  --  9.0  PHOS  --   --  6.3*   Liver Function Tests: Recent Labs  Lab 10/06/20 2036 10/07/20 0317  AST 17 17  ALT 13 13  ALKPHOS 77 86  BILITOT 1.1 0.8  PROT 6.4* 6.5  ALBUMIN 3.3* 3.2*   No results for input(s): LIPASE, AMYLASE in the last 168 hours. CBC: Recent Labs  Lab 10/06/20 2036 10/06/20 2052 10/07/20 0441  WBC 7.1  --  6.8  NEUTROABS 5.6  --  4.9  HGB 9.0* 8.5* 8.7*  HCT 27.3* 25.0* 25.9*  MCV 92.5  --  92.2  PLT 229  --  221   Blood Culture    Component Value Date/Time   SDES BLOOD RIGHT ANTECUBITAL 10/07/2020 0318   SDES BLOOD RIGHT FOREARM 10/07/2020 0318   SPECREQUEST  10/07/2020 0318    BOTTLES DRAWN AEROBIC AND ANAEROBIC Blood Culture adequate volume   SPECREQUEST  10/07/2020 0318    BOTTLES DRAWN AEROBIC AND ANAEROBIC Blood Culture adequate volume   CULT  10/07/2020 0318    NO GROWTH 2 DAYS Performed at [facility], [place] [address] [place], [place] [address]    CULT  10/07/2020 0318    NO GROWTH 2 DAYS Performed at [facility], [place] [address]  [place], [place] [address]    REPTSTATUS PENDING 10/07/2020 0318   REPTSTATUS PENDING 10/07/2020 0318    Cardiac Enzymes: No results for input(s): CKTOTAL, CKMB, CKMBINDEX, TROPONINI in the last 168 hours. CBG: Recent Labs  Lab 10/09/20 0636 10/09/20 1127 10/09/20 1828 10/09/20 2121 10/10/20 0732  GLUCAP 169* 134* 198* 139* 135*   Iron Studies: No results for input(s): IRON, TIBC, TRANSFERRIN, FERRITIN in the last 72 hours. @lablastinr3 @ Studies/Results: VAS US CAROTID  Result Date: 10/09/2020 Carotid Arterial Duplex Study Patient Name:  Scott Castillo [place]  Date of Exam:   10/08/2020 Medical Rec #: [id number]        Accession #:    [id number] Date of Birth: Sep 10, 1960        Patient Gender: M Patient Age:   59Y Exam Location:  [facility] Procedure:      VAS US CAROTID Referring Phys: [id number] [place] --------------------------------------------------------------------------------  Indications:       Acute encephalopathy. Risk Factors:      Hypertension, hyperlipidemia, Diabetes, no history of  smoking. Other Factors:     Venous stasis BLE, ESRD(HD). Comparison Study:  No previous exams Performing Technologist: Jody Hill RVT, RDMS  Examination Guidelines: A complete evaluation includes B-mode imaging, spectral Doppler, color Doppler, and power Doppler as needed of all accessible portions of each vessel. Bilateral testing is considered an integral part of a complete examination. Limited examinations for reoccurring indications may be performed as noted.  Right Carotid Findings: +----------+--------+--------+--------+------------------+------------------+           PSV cm/sEDV cm/sStenosisPlaque DescriptionComments           +----------+--------+--------+--------+------------------+------------------+ CCA Prox  75      14                                                   +----------+--------+--------+--------+------------------+------------------+ CCA  Distal62      11                                intimal thickening +----------+--------+--------+--------+------------------+------------------+ ICA Prox  56      17                                                   +----------+--------+--------+--------+------------------+------------------+ ICA Distal65      19                                                   +----------+--------+--------+--------+------------------+------------------+ ECA       73      5                                                    +----------+--------+--------+--------+------------------+------------------+ +----------+--------+-------+----------------+-------------------+           PSV cm/sEDV cmsDescribe        Arm Pressure (mmHG) +----------+--------+-------+----------------+-------------------+ [id number]             Multiphasic, WNL                    +----------+--------+-------+----------------+-------------------+ +---------+--------+--+--------+--+---------+ VertebralPSV cm/s39EDV cm/s12Antegrade +---------+--------+--+--------+--+---------+  Left Carotid Findings: +----------+--------+--------+--------+------------------+------------------+           PSV cm/sEDV cm/sStenosisPlaque DescriptionComments           +----------+--------+--------+--------+------------------+------------------+ CCA Prox  87      10                                intimal thickening +----------+--------+--------+--------+------------------+------------------+ CCA Distal72      10                                intimal thickening +----------+--------+--------+--------+------------------+------------------+ ICA Prox  103     30                                                   +----------+--------+--------+--------+------------------+------------------+  ICA Distal105     30                                                    +----------+--------+--------+--------+------------------+------------------+ ECA       73      0                                                    +----------+--------+--------+--------+------------------+------------------+ +----------+--------+--------+-----------------------------+-------------------+           PSV cm/sEDV cm/sDescribe                     Arm Pressure (mmHG) +----------+--------+--------+-----------------------------+-------------------+ [id number]     45      Elevated velocities without                                                evidence of stenosis.                            +----------+--------+--------+-----------------------------+-------------------+ +---------+--------+--+--------+--+---------+ VertebralPSV cm/s35EDV cm/s10Antegrade +---------+--------+--+--------+--+---------+   Summary: Right Carotid: The extracranial vessels were near-normal with only minimal wall                thickening or plaque. Left Carotid: The extracranial vessels were near-normal with only minimal wall               thickening or plaque. Vertebrals:  Bilateral vertebral arteries demonstrate antegrade flow. Subclavians: Normal flow hemodynamics were seen in the right subclavian artery.              Left subclavian show elevated velocities without evidence of              stenosis. *See table(s) above for measurements and observations.  Electronically signed by Jamelle Haring on 10/09/2020 at 12:25:41 PM.    Final    Medications:   calcitRIOL  2.25 mcg Oral Q M,W,F-HD   carvedilol  25 mg Oral BID WC   Chlorhexidine Gluconate Cloth  6 each Topical Q0600   darbepoetin (ARANESP) injection - DIALYSIS  40 mcg Intravenous Q Fri-HD   ferric citrate  420 mg Oral TID WC   insulin aspart  0-9 Units Subcutaneous TID AC & HS   irbesartan  150 mg Oral Daily   pantoprazole  40 mg Oral Q1200   pravastatin  80 mg Oral QPM   torsemide  100 mg Oral Once per day on Sun Tue Thu Sat      Dialysis Orders: MWF - [facility]  4hrs/40min BFR 400, DFR 800,  EDW:108.5kg, 2K/ 2Ca -No Heparin -Mircera 75 mcg IV q 4 weeks (last dose 09/19/20) -Venofer 50 mg IV weekly    Assessment/Plan: Acute Metabolic Encephalopathy- Extensive work up, basically negative. Patient has been on HD since 2018 so dialysis disequilibrium syndrome by definition not likely.Initially associated with HD but not so much lately. Has been tolerating better. Seen by neurology again, nothing further to add other than checking cortisol levels.   ESRD - on HD MWF:  HD today on schedule.   Hypertension/volume patient euvolemic on exam-BP mildly elevated this morning-continue Carvedilol, Avapro, Torsemide, and Hydralazine. UF as tolerated.    Anemia of CKD Hgb now 8.7-last ESA given on 09/19/20-will resume here if patient is still here. Otherwise will continue SA per protocol in outpatient.  Secondary Hyperparathyroidism -  Corr Ca ok, PO4 slightly high-will resume binder.  Nutrition - Renal diet with fluid restriction.  DM-per primary  Jimmye Norman. Brown NP-C 10/10/2020, 8:09 AM  [facility] [phone]  Patient seen and examined, agree with above note with above modifications. Pt for discharge today-  is really back to his baseline mentally-  true that initially his sxms were coincident with HD but now not so much and is has been tolerating dialysis better of late- BP a little high-  can fine tune regimen as OP  Corliss Parish, MD 10/10/2020

## 2020-10-10 NOTE — TOC Transition Note (Addendum)
Transition of Care [facility]) - CM/SW Discharge Note   Patient Details  Name: Scott Castillo MRN: [id number] Date of Birth: 1960-10-30  [facility] CM/SW Contact:  Verdell Carmine, RN Phone Number: 10/10/2020, 10:19 AM   Clinical Narrative:     Will be discharging home today with Home health. [facility] made aware  Final next level of care: [place] Barriers to Discharge: No Barriers Identified   Patient Goals and CMS Choice        Discharge Placement               Home with [place]        Discharge Plan and Services   Discharge Planning Services: CM Consult                      HH Arranged: PT [facility] Agency: Center   Time [place]: 2080 Representative spoke with at [place]: [place] ([place]) Interventions     Readmission Risk Interventions Readmission Risk Prevention Plan 06/19/2020  Transportation Screening Complete  PCP or Specialist Appt within 3-5 Days Complete  HRI or [place] Complete  Social Work Consult for [place] Planning/Counseling Complete  Palliative Care Screening Complete  Medication Review Press photographer) Referral to Pharmacy  Some recent data might be hidden

## 2020-10-10 NOTE — Progress Notes (Signed)
DISCHARGE NOTE HOME Kasch E Vides to be discharged Home per MD order. Discussed prescriptions and follow up appointments with the patient. Prescriptions given to patient; medication list explained in detail. Patient verbalized understanding.  Skin clean, dry and intact without evidence of skin break down, no evidence of skin tears noted. IV catheter discontinued intact. Site without signs and symptoms of complications. Dressing and pressure applied. Pt denies pain at the site currently. No complaints noted.  Patient free of lines, drains, and wounds.   An After Visit Summary (AVS) was printed and given to the patient. Patient escorted via wheelchair, and discharged home via private auto.  Berneta Levins, RN

## 2020-10-10 NOTE — Progress Notes (Signed)
Renal Navigator learned in progression that patient is ready for discharge after HD. Navigator spoke with Attending yesterday and knows that patient was awaiting consult by Neurology and sees that he has had this late last night. Navigator spoke with Renal PA and Attending to see if it is acceptable to speak with patient about going to outpatient HD clinic for treatment today, as the inpt unit is very full today. His clinic can accommodate him as long as he arrives by noon (and will still be treated if he gets there later, for as long as he has left to treat). Navigator met with patient, who is pleasant and agreeable to above. He called his mom, who will leave now to pick him up and take him to his clinic. Attending to put in discharge and Renal PA to send orders to clinic. Software engineer. Bedside RN aware and charge to assist for timeliness.   Alphonzo Cruise, [place] Renal Navigator [phone]
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
## 2020-10-11 ENCOUNTER — Telehealth: Payer: Self-pay | Admitting: Nurse Practitioner

## 2020-10-11 NOTE — Telephone Encounter (Signed)
Transition of care contact from inpatient facility  Date of Discharge: 10/10/2020 Date of Contact:10/11/2020 Method of contact: Phone  Attempted to contact patient to discuss transition of care from inpatient admission. Patient did not answer the phone. Message was left on the patient's voicemail with call back number [phone].  

## 2020-10-12 LAB — CULTURE, BLOOD (ROUTINE X 2)
Culture: NO GROWTH
Culture: NO GROWTH
Special Requests: ADEQUATE
Special Requests: ADEQUATE

## 2021-04-20 ENCOUNTER — Encounter (HOSPITAL_COMMUNITY): Payer: Self-pay

## 2021-04-20 ENCOUNTER — Observation Stay (HOSPITAL_COMMUNITY)
Admission: EM | Admit: 2021-04-20 | Discharge: 2021-04-22 | Disposition: A | Discharge status: Home / Self Care | Payer: Medicare Other | Attending: Student in an Organized Health Care Education/Training Program | Admitting: Student in an Organized Health Care Education/Training Program

## 2021-04-20 ENCOUNTER — Observation Stay (HOSPITAL_COMMUNITY): Payer: Medicare Other

## 2021-04-20 ENCOUNTER — Emergency Department (HOSPITAL_COMMUNITY): Payer: Medicare Other

## 2021-04-20 DIAGNOSIS — Z992 Dependence on renal dialysis: Secondary | ICD-10-CM | POA: Insufficient documentation

## 2021-04-20 DIAGNOSIS — I120 Hypertensive chronic kidney disease with stage 5 chronic kidney disease or end stage renal disease: Secondary | ICD-10-CM | POA: Diagnosis not present

## 2021-04-20 DIAGNOSIS — Z79899 Other long term (current) drug therapy: Secondary | ICD-10-CM | POA: Insufficient documentation

## 2021-04-20 DIAGNOSIS — I5042 Chronic combined systolic (congestive) and diastolic (congestive) heart failure: Secondary | ICD-10-CM

## 2021-04-20 DIAGNOSIS — R4182 Altered mental status, unspecified: Secondary | ICD-10-CM | POA: Diagnosis not present

## 2021-04-20 DIAGNOSIS — Z91040 Latex allergy status: Secondary | ICD-10-CM | POA: Diagnosis not present

## 2021-04-20 DIAGNOSIS — Z20822 Contact with and (suspected) exposure to covid-19: Secondary | ICD-10-CM | POA: Diagnosis not present

## 2021-04-20 DIAGNOSIS — I639 Cerebral infarction, unspecified: Secondary | ICD-10-CM

## 2021-04-20 DIAGNOSIS — R479 Unspecified speech disturbances: Secondary | ICD-10-CM

## 2021-04-20 DIAGNOSIS — R2689 Other abnormalities of gait and mobility: Secondary | ICD-10-CM | POA: Diagnosis not present

## 2021-04-20 DIAGNOSIS — D631 Anemia in chronic kidney disease: Secondary | ICD-10-CM | POA: Diagnosis not present

## 2021-04-20 DIAGNOSIS — F801 Expressive language disorder: Principal | ICD-10-CM | POA: Insufficient documentation

## 2021-04-20 DIAGNOSIS — R4701 Aphasia: Secondary | ICD-10-CM | POA: Diagnosis present

## 2021-04-20 DIAGNOSIS — R2681 Unsteadiness on feet: Secondary | ICD-10-CM | POA: Insufficient documentation

## 2021-04-20 DIAGNOSIS — E1122 Type 2 diabetes mellitus with diabetic chronic kidney disease: Secondary | ICD-10-CM | POA: Diagnosis not present

## 2021-04-20 DIAGNOSIS — G459 Transient cerebral ischemic attack, unspecified: Secondary | ICD-10-CM | POA: Diagnosis not present

## 2021-04-20 DIAGNOSIS — N186 End stage renal disease: Secondary | ICD-10-CM | POA: Diagnosis not present

## 2021-04-20 LAB — DIFFERENTIAL
Abs Immature Granulocytes: 0.03 10*3/uL (ref 0.00–0.07)
Basophils Absolute: 0 10*3/uL (ref 0.0–0.1)
Basophils Relative: 1 %
Eosinophils Absolute: 0.3 10*3/uL (ref 0.0–0.5)
Eosinophils Relative: 3 %
Immature Granulocytes: 0 %
Lymphocytes Relative: 6 %
Lymphs Abs: 0.5 10*3/uL — ABNORMAL LOW (ref 0.7–4.0)
Monocytes Absolute: 0.5 10*3/uL (ref 0.1–1.0)
Monocytes Relative: 6 %
Neutro Abs: 7.1 10*3/uL (ref 1.7–7.7)
Neutrophils Relative %: 84 %

## 2021-04-20 LAB — I-STAT CHEM 8, ED
BUN: 87 mg/dL — ABNORMAL HIGH (ref 6–20)
Calcium, Ion: 1.08 mmol/L — ABNORMAL LOW (ref 1.15–1.40)
Chloride: 94 mmol/L — ABNORMAL LOW (ref 98–111)
Creatinine, Ser: 12 mg/dL — ABNORMAL HIGH (ref 0.61–1.24)
Glucose, Bld: 322 mg/dL — ABNORMAL HIGH (ref 70–99)
HCT: 27 % — ABNORMAL LOW (ref 39.0–52.0)
Hemoglobin: 9.2 g/dL — ABNORMAL LOW (ref 13.0–17.0)
Potassium: 4.8 mmol/L (ref 3.5–5.1)
Sodium: 127 mmol/L — ABNORMAL LOW (ref 135–145)
TCO2: 22 mmol/L (ref 22–32)

## 2021-04-20 LAB — COMPREHENSIVE METABOLIC PANEL
ALT: 20 U/L (ref 0–44)
AST: 17 U/L (ref 15–41)
Albumin: 3.1 g/dL — ABNORMAL LOW (ref 3.5–5.0)
Alkaline Phosphatase: 91 U/L (ref 38–126)
Anion gap: 18 — ABNORMAL HIGH (ref 5–15)
BUN: 83 mg/dL — ABNORMAL HIGH (ref 6–20)
CO2: 21 mmol/L — ABNORMAL LOW (ref 22–32)
Calcium: 9.4 mg/dL (ref 8.9–10.3)
Chloride: 90 mmol/L — ABNORMAL LOW (ref 98–111)
Creatinine, Ser: 10.93 mg/dL — ABNORMAL HIGH (ref 0.61–1.24)
GFR, Estimated: 5 mL/min — ABNORMAL LOW (ref >60)
Glucose, Bld: 337 mg/dL — ABNORMAL HIGH (ref 70–99)
Potassium: 4.8 mmol/L (ref 3.5–5.1)
Sodium: 129 mmol/L — ABNORMAL LOW (ref 135–145)
Total Bilirubin: 1 mg/dL (ref 0.3–1.2)
Total Protein: 6.6 g/dL (ref 6.5–8.1)

## 2021-04-20 LAB — CBC
HCT: 27.6 % — ABNORMAL LOW (ref 39.0–52.0)
Hemoglobin: 8.9 g/dL — ABNORMAL LOW (ref 13.0–17.0)
MCH: 29 pg (ref 26.0–34.0)
MCHC: 32.2 g/dL (ref 30.0–36.0)
MCV: 89.9 fL (ref 80.0–100.0)
Platelets: 225 10*3/uL (ref 150–400)
RBC: 3.07 MIL/uL — ABNORMAL LOW (ref 4.22–5.81)
RDW: 15.9 % — ABNORMAL HIGH (ref 11.5–15.5)
WBC: 8.4 10*3/uL (ref 4.0–10.5)
nRBC: 0 % (ref 0.0–0.2)

## 2021-04-20 LAB — HEMOGLOBIN A1C
Hgb A1c MFr Bld: 8.5 % — ABNORMAL HIGH (ref 4.8–5.6)
Mean Plasma Glucose: 197.25 mg/dL

## 2021-04-20 LAB — CBG MONITORING, ED
Glucose-Capillary: 317 mg/dL — ABNORMAL HIGH (ref 70–99)
Glucose-Capillary: 279 mg/dL — ABNORMAL HIGH (ref 70–99)
Glucose-Capillary: 197 mg/dL — ABNORMAL HIGH (ref 70–99)

## 2021-04-20 LAB — LDL CHOLESTEROL, DIRECT: Direct LDL: 55.3 mg/dL (ref 0–99)

## 2021-04-20 LAB — GLUCOSE, CAPILLARY: Glucose-Capillary: 205 mg/dL — ABNORMAL HIGH (ref 70–99)

## 2021-04-20 LAB — PROTIME-INR
INR: 1.3 — ABNORMAL HIGH (ref 0.8–1.2)
Prothrombin Time: 15.9 seconds — ABNORMAL HIGH (ref 11.4–15.2)

## 2021-04-20 LAB — RESP PANEL BY RT-PCR (FLU A&B, COVID) ARPGX2
Influenza A by PCR: NEGATIVE
Influenza B by PCR: NEGATIVE
SARS Coronavirus 2 by RT PCR: NEGATIVE

## 2021-04-20 LAB — APTT: aPTT: 29 seconds (ref 24–36)

## 2021-04-20 MED ORDER — LIDOCAINE-PRILOCAINE 2.5-2.5 % EX CREA
1.0000 "application " | TOPICAL_CREAM | CUTANEOUS | Status: DC | PRN
  Filled 2021-04-20: qty 5

## 2021-04-20 MED ORDER — INSULIN ASPART 100 UNIT/ML IJ SOLN
0.0000 [IU] | Freq: Three times a day (TID) | INTRAMUSCULAR | Status: DC
  Administered 2021-04-20: 4 [IU] via SUBCUTANEOUS
  Administered 2021-04-20 – 2021-04-21 (×2): 1 [IU] via SUBCUTANEOUS
  Administered 2021-04-21: 2 [IU] via SUBCUTANEOUS

## 2021-04-20 MED ORDER — NA FERRIC GLUC CPLX IN SUCROSE 12.5 MG/ML IV SOLN
125.0000 mg | INTRAVENOUS | Status: DC
  Filled 2021-04-20: qty 10

## 2021-04-20 MED ORDER — FERRIC CITRATE 1 GM 210 MG(FE) PO TABS
630.0000 mg | ORAL_TABLET | ORAL | Status: DC | PRN
  Filled 2021-04-20: qty 3

## 2021-04-20 MED ORDER — FERRIC CITRATE 1 GM 210 MG(FE) PO TABS
420.0000 mg | ORAL_TABLET | Freq: Three times a day (TID) | ORAL | Status: DC
  Administered 2021-04-21 – 2021-04-22 (×4): 420 mg via ORAL
  Filled 2021-04-20 (×6): qty 2

## 2021-04-20 MED ORDER — ONDANSETRON HCL 4 MG/2ML IJ SOLN: 4.0000 mg | Freq: Four times a day (QID) | INTRAMUSCULAR | Status: DC | PRN

## 2021-04-20 MED ORDER — METOPROLOL TARTRATE 5 MG/5ML IV SOLN
5.0000 mg | INTRAVENOUS | Status: DC | PRN
  Administered 2021-04-20 (×2): 5 mg via INTRAVENOUS
  Filled 2021-04-20 (×2): qty 5

## 2021-04-20 MED ORDER — ATORVASTATIN CALCIUM 40 MG PO TABS
40.0000 mg | ORAL_TABLET | Freq: Every day | ORAL | Status: DC
  Administered 2021-04-20 – 2021-04-22 (×3): 40 mg via ORAL
  Filled 2021-04-20 (×3): qty 1

## 2021-04-20 MED ORDER — CHLORHEXIDINE GLUCONATE CLOTH 2 % EX PADS
6.0000 | MEDICATED_PAD | Freq: Every day | CUTANEOUS | Status: DC
  Administered 2021-04-21 – 2021-04-22 (×2): 6 via TOPICAL

## 2021-04-20 MED ORDER — SODIUM CHLORIDE 0.9% FLUSH
3.0000 mL | Freq: Two times a day (BID) | INTRAVENOUS | Status: DC
  Administered 2021-04-20 – 2021-04-21 (×4): 3 mL via INTRAVENOUS

## 2021-04-20 MED ORDER — CLOPIDOGREL BISULFATE 75 MG PO TABS
75.0000 mg | ORAL_TABLET | Freq: Every day | ORAL | Status: DC
  Administered 2021-04-20 – 2021-04-22 (×3): 75 mg via ORAL
  Filled 2021-04-20 (×3): qty 1

## 2021-04-20 MED ORDER — LORAZEPAM 2 MG/ML IJ SOLN
1.0000 mg | Freq: Once | INTRAMUSCULAR | Status: AC
  Administered 2021-04-20: 1 mg via INTRAVENOUS
  Filled 2021-04-20: qty 1

## 2021-04-20 MED ORDER — SODIUM CHLORIDE 0.9 % IV SOLN: 100.0000 mL | INTRAVENOUS | Status: DC | PRN

## 2021-04-20 MED ORDER — SENNOSIDES-DOCUSATE SODIUM 8.6-50 MG PO TABS: 1.0000 | ORAL_TABLET | Freq: Every evening | ORAL | Status: DC | PRN

## 2021-04-20 MED ORDER — IOHEXOL 350 MG/ML SOLN
100.0000 mL | Freq: Once | INTRAVENOUS | Status: AC | PRN
  Administered 2021-04-20: 100 mL via INTRAVENOUS

## 2021-04-20 MED ORDER — ONDANSETRON HCL 4 MG PO TABS: 4.0000 mg | ORAL_TABLET | Freq: Four times a day (QID) | ORAL | Status: DC | PRN

## 2021-04-20 MED ORDER — LOPERAMIDE HCL 2 MG PO CAPS: 2.0000 mg | ORAL_CAPSULE | Freq: Four times a day (QID) | ORAL | Status: DC | PRN

## 2021-04-20 MED ORDER — LIDOCAINE HCL (PF) 1 % IJ SOLN: 5.0000 mL | INTRAMUSCULAR | Status: DC | PRN

## 2021-04-20 MED ORDER — ASPIRIN 81 MG PO CHEW
81.0000 mg | CHEWABLE_TABLET | Freq: Every day | ORAL | Status: DC
  Administered 2021-04-21 – 2021-04-22 (×2): 81 mg via ORAL
  Filled 2021-04-20 (×2): qty 1

## 2021-04-20 MED ORDER — SODIUM CHLORIDE 0.9% FLUSH
3.0000 mL | Freq: Once | INTRAVENOUS | Status: AC
  Administered 2021-04-20: 3 mL via INTRAVENOUS

## 2021-04-20 MED ORDER — ASPIRIN 325 MG PO TABS
325.0000 mg | ORAL_TABLET | Freq: Once | ORAL | Status: AC
  Administered 2021-04-20: 325 mg via ORAL
  Filled 2021-04-20: qty 1

## 2021-04-20 MED ORDER — CALCITRIOL 0.25 MCG PO CAPS: 1.5000 ug | ORAL_CAPSULE | ORAL | Status: DC

## 2021-04-20 MED ORDER — ACETAMINOPHEN 650 MG RE SUPP: 650.0000 mg | Freq: Four times a day (QID) | RECTAL | Status: DC | PRN

## 2021-04-20 MED ORDER — HEPARIN SODIUM (PORCINE) 5000 UNIT/ML IJ SOLN
5000.0000 [IU] | Freq: Three times a day (TID) | INTRAMUSCULAR | Status: DC
  Administered 2021-04-20 – 2021-04-21 (×4): 5000 [IU] via SUBCUTANEOUS
  Filled 2021-04-20 (×5): qty 1

## 2021-04-20 MED ORDER — FERRIC CITRATE 1 GM 210 MG(FE) PO TABS: 210.0000 mg | ORAL_TABLET | ORAL | Status: DC | PRN

## 2021-04-20 MED ORDER — ACETAMINOPHEN 325 MG PO TABS: 650.0000 mg | ORAL_TABLET | Freq: Four times a day (QID) | ORAL | Status: DC | PRN

## 2021-04-20 MED ORDER — PENTAFLUOROPROP-TETRAFLUOROETH EX AERO
1.0000 "application " | INHALATION_SPRAY | CUTANEOUS | Status: DC | PRN
  Filled 2021-04-20: qty 116

## 2021-04-20 NOTE — ED Notes (Signed)
Pt was brought back to room by transporter. O2 was at 85% on RA. Placed on 2LPM [place]. O2 is now 93-96% on the monitor.

## 2021-04-20 NOTE — ED Notes (Signed)
MRI is coming to take pt. Pt is claustrophobic. Ativan IVP given.

## 2021-04-20 NOTE — ED Notes (Signed)
MRI called and states unable to do MRI despite pt was given IV ativan prior to going to MRI. Will notify MD.

## 2021-04-20 NOTE — Evaluation (Signed)
Physical Therapy Evaluation Patient Details Name: Scott Castillo MRN: [id number] DOB: 08-12-1960 Today's Date: 04/20/2021  History of Present Illness  Scott Castillo is a 61 y.o. 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  MRI without acute findings. Premature brain atrophy present. MRI is motion degraded  Clinical Impression  Patient presents with decreased mobility most likely due to bedrest.  He is intermittently confused and has some difficulty expressing his history, but continued to report his mobility is not far from baseline despite some imbalance with ambulation, which he reports is due to being in the bed.  He normally works at [employer] 3 hours 3 days a week just after dialysis.  He only reports one fall in 6 months out of the bed.  He is not reliable historian, but feel he could potentially return home with intermittent assist from wife and using his compensatory techniques as needed due to balance issues (including having walkers at home.)  Feel he could benefit from outpatient PT for further balance training.  PT to continue to follow.        Recommendations for follow up therapy are one component of a multi-disciplinary discharge planning process, led by the attending physician.  Recommendations may be updated based on patient status, additional functional criteria and insurance authorization.  Follow Up Recommendations Outpatient PT    Assistance Recommended at Discharge Intermittent Supervision/Assistance  Functional Status Assessment Patient has had a recent decline in their functional status and demonstrates the ability to make significant improvements in function in a reasonable and predictable amount of time.  Equipment Recommendations  None recommended by PT    Recommendations for Other  Services       Precautions / Restrictions Precautions Precautions: Fall Precaution Comments: reports no recent falls except out of bed x 1 in 6 months      Mobility  Bed Mobility Overal bed mobility: Modified Independent                  Transfers Overall transfer level: Needs assistance   Transfers: Sit to/from Stand Sit to Stand: Supervision;Min guard           General transfer comment: from stretcher in ED assist for balance/safety    Ambulation/Gait Ambulation/Gait assistance: Min guard Gait Distance (Feet): 100 Feet Assistive device: None Gait Pattern/deviations: Step-to pattern;Step-through pattern;Decreased stride length;Wide base of support;Decreased dorsiflexion - right;Decreased dorsiflexion - left;Trunk flexed       General Gait Details: heavily lifting feet off the ground, no ankle push off or heel strike, flexed posture throughout, minguard for safety/balance varying speeds from fast to slow as progressed  Financial trader Rankin (Stroke Patients Only) Modified Rankin (Stroke Patients Only) Pre-Morbid Rankin Score: No significant disability Modified Rankin: Moderately severe disability     Balance Overall balance assessment: Needs assistance   Sitting balance-Leahy Scale: Good Sitting balance - Comments: seated EOB able top cross leg to don shoes   Standing balance support: No upper extremity supported Standing balance-Leahy Scale: Fair Standing balance comment: multiple compensatory techniques utilized due to h/o balance issues due to ankle and foot neuropathy (bracing backs of legs against EOB, flexed posture, making sure to don shoes first, wide BOS, etc.) static balance unaided, at least S for  ambulation to minguard due to instability                             Pertinent Vitals/Pain Pain Assessment: No/denies pain    Home Living Family/patient expects to be discharged to:: Private  residence Living Arrangements: Spouse/significant other Available Help at Discharge: Family Type of Home: Mobile home Home Access: Stairs to enter Entrance Stairs-Rails: Psychiatric nurse of Steps: 3   Home Layout: One level Home Equipment: Rollator (4 wheels);Rolling Walker (2 wheels);BSC/3in1;Grab bars - tub/shower Additional Comments: wife works in Ambulance person at [employer]    Prior Function Prior Level of Function : Independent/Modified Independent             Mobility Comments: fell out of bed x 1 in past six months ADLs Comments: 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; restocks     Hand Dominance   Dominant Hand: Right    Extremity/Trunk Assessment   Upper Extremity Assessment Upper Extremity Assessment: Defer to OT evaluation (numbness in bilateral hands)    Lower Extremity Assessment Lower Extremity Assessment: RLE deficits/detail;LLE deficits/detail RLE Deficits / Details: AROM WFL, except ankle DF limited on R due to significant weakness of dorsiflexors, pt relates is chronic RLE Sensation: history of peripheral neuropathy RLE Coordination: decreased gross motor LLE Deficits / Details: limited ankle DF with ROM limitations, strength ankle DF 3+/5 LLE Sensation: history of peripheral neuropathy LLE Coordination: decreased gross motor       Communication   Communication: No difficulties  Cognition Arousal/Alertness: Awake/alert Behavior During Therapy: WFL for tasks assessed/performed Overall Cognitive Status: No family/caregiver present to determine baseline cognitive functioning                                 General Comments: word finding difficulties, confusion about when and if he ate lunch (RN confirmed,) initial lethargy, but easliy aroused, RN reports Ativan for MRI.        General Comments General comments (skin integrity, edema, etc.): Reported he has not had falls,  but admits to furniture walking at home    Exercises     Assessment/Plan    PT Assessment Patient needs continued PT services  PT Problem List Decreased balance;Decreased cognition;Decreased activity tolerance;Decreased mobility;Decreased strength;Decreased knowledge of use of DME       PT Treatment Interventions DME instruction;Therapeutic activities;Gait training;Therapeutic exercise;Patient/family education;Balance training;Functional mobility training    PT Goals (Current goals can be found in the Care Plan section)  Acute Rehab PT Goals Patient Stated Goal: to go home after he gets dialysis PT Goal Formulation: With patient Time For Goal Achievement: 05/04/21 Potential to Achieve Goals: Good    Frequency Min 3X/week   Barriers to discharge        Co-evaluation               AM-PAC PT "6 Clicks" Mobility  Outcome Measure Help needed turning from your back to your side while in a flat bed without using bedrails?: A Little Help needed moving from lying on your back to sitting on the side of a flat bed without using bedrails?: A Little Help needed moving to and from a bed to a chair (including a wheelchair)?: None Help needed standing up from a chair using your arms (e.g., wheelchair or bedside chair)?: A Little Help needed to walk  in hospital room?: A Little Help needed climbing 3-5 steps with a railing? : A Little 6 Click Score: 19    End of Session Equipment Utilized During Treatment: Gait belt Activity Tolerance: Patient limited by fatigue Patient left: in bed;with call bell/phone within reach Nurse Communication: Mobility status PT Visit Diagnosis: Other abnormalities of gait and mobility (R26.89);Other symptoms and signs involving the nervous system (R29.898)    Time: 4665-9935 PT Time Calculation (min) (ACUTE ONLY): 23 min   Charges:   PT Evaluation $PT Eval Moderate Complexity: 1 Mod PT Treatments $Gait Training: 8-22 mins        Magda Kiel,  PT Acute Rehabilitation Services Pager:[phone] Office:[phone] 04/20/2021   Reginia Naas 04/20/2021, 5:10 PM

## 2021-04-20 NOTE — Consult Note (Signed)
NEUROLOGY CONSULTATION NOTE   Date of service: April 20, 2021 Patient Name: Scott Castillo MRN:  [id number] DOB:  01/25/1961 Reason for consult: stroke code Requesting physician: Dr. Davonna Belling _ _ _   _ __   _ __ _ _  __ __   _ __   __ _  History of Present Illness   61 yo patient with hx ESRD on dialysis, DM2, HTN, HL who presents with expressive aphasia, LKW last night. He was seen by his wife at 0400 when she woke up and he told her his whole R side was numb. He then went to work, and was found in the breakroom by coworkers not making sense with his speech. He has presented [facility] ED multiple times previously with transient aphasia, nearly always during or shortly after dialysis. On exam he has R NLF flattening, fluctuating aphasia expressive only, L hand numbness which may be chronic, and mild dysarthria. NIHSS = 4. Head CT NAICP. TNK not administered 2/2 presentation outside the window. CTA showed no LVO. CTP no perfusion deficit.  CNS imaging personally reviewed.   ROS   UTA 2/2 aphasia  Past History   I have reviewed the following:  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   H/O pancreatic cancer    HLD (hyperlipidemia)    HTN (hypertension)    Lumbar radiculopathy, chronic    hemilamectomy '94   Venous stasis dermatitis of both lower extremities    Past Surgical History:  Procedure Laterality Date   A/V FISTULAGRAM Left 09/27/2019   Procedure: A/V FISTULAGRAM;  Surgeon: Marty Heck, MD;  Location: [facility];  Service: Cardiovascular;  Laterality: Left;   AV FISTULA PLACEMENT Left 09/17/2016   Procedure: ARTERIOVENOUS (AV) FISTULA CREATION;  Surgeon: [name], MD;  Location: [facility] OR;  Service: Vascular;  Laterality: Left;   CATARACT EXTRACTION W/ INTRAOCULAR LENS IMPLANT Left 2015    ENDOVENOUS ABLATION SAPHENOUS VEIN W/ LASER Right 04/08/2016   EVLA R greater saphenous vein by Curt Jews MD   ENDOVENOUS ABLATION SAPHENOUS VEIN W/ LASER Left 05/13/2016   endovenous laser ablation (left greater saphenous vein) by Curt Jews MD    FOOT SURGERY     HEMILAMINOTOMY LUMBAR SPINE  '94   TONSILLECTOMY     Family History  Problem Relation Age of Onset   Edema Mother    Heart attack Father    Heart failure Father    Heart attack Brother    Heart attack Brother    Social History   Socioeconomic History   Marital status: Married    Spouse name: Not on file   Number of children: Not on file   Years of education: Not on file   Highest education level: Not on file  Occupational History   Not on file  Tobacco Use   Smoking status: Never   Smokeless tobacco: Never  Substance and Sexual Activity   Alcohol use: No   Drug use: No   Sexual activity: Not on file  Other Topics Concern   Not on file  Social History Narrative   Not on file   Social Determinants of Health   Financial Resource Strain: Not on file  Food Insecurity: Not on file  Transportation Needs: Not on file  Physical  Activity: Not on file  Stress: Not on file  Social Connections: Not on file   Allergies  Allergen Reactions   Heparin Other (See Comments)    Per pt he has episodes where he can't speak   Pioglitazone Swelling    Site of swelling not recalled by patient   Adhesive [Tape] Other (See Comments)    Causes redness, pt prefers paper tape    Latex Rash    Redness, also    Medications   (Not in a hospital admission)     Current Facility-Administered Medications:    sodium chloride flush (NS) 0.9 % injection 3 mL, 3 mL, Intravenous, Once, Davonna Belling, MD  Current Outpatient Medications:    acetaminophen (TYLENOL) 325 MG tablet, Take 2 tablets (650 mg total) by mouth every 6 (six) hours as needed for mild pain (or Fever >/= 101)., Disp: , Rfl:    ascorbic acid (VITAMIN C)  1000 MG tablet, Take 1,000 mg by mouth daily., Disp: , Rfl:    calcitRIOL (ROCALTROL) 0.25 MCG capsule, Take 9 capsules (2.25 mcg total) by mouth every Monday, Wednesday, and Friday with hemodialysis., Disp: , Rfl:    carvedilol (COREG) 25 MG tablet, Take 25 mg by mouth 2 (two) times daily with a meal. , Disp: , Rfl:    co-enzyme Q-10 30 MG capsule, Take 30 mg by mouth 2 (two) times daily., Disp: , Rfl:    cyanocobalamin 1000 MCG tablet, Take 1,000 mcg by mouth daily., Disp: , Rfl:    ferric citrate (AURYXIA) 1 GM 210 MG(Fe) tablet, Take 210-420 mg by mouth See admin instructions. Take 2 tablets (420 mg) by mouth 3 times daily with meals and 1 tablet (210 mg) with snacks, Disp: , Rfl:    Insulin Pen Needle (BD ULTRA-FINE PEN NEEDLES) 29G X 12.7MM MISC, 1 each by Other route as directed., Disp: , Rfl:    irbesartan (AVAPRO) 150 MG tablet, Take 150 mg by mouth daily., Disp: , Rfl:    loperamide (IMODIUM) 2 MG capsule, Take 1 capsule (2 mg total) by mouth every 6 (six) hours as needed for diarrhea or loose stools., Disp: 30 capsule, Rfl: 0   pantoprazole (PROTONIX) 40 MG tablet, Take 1 tablet (40 mg total) by mouth daily at 12 noon., Disp: 30 tablet, Rfl: 0   pravastatin (PRAVACHOL) 80 MG tablet, Take 80 mg by mouth every evening. , Disp: , Rfl:    Semaglutide (RYBELSUS) 14 MG TABS, Take 14 mg by mouth daily., Disp: , Rfl:    torsemide (DEMADEX) 100 MG tablet, Take 100 mg by mouth See admin instructions. Takes on Tuesday,Thursday,Saturday and Sunday(non-dialysis days), Disp: , Rfl:    vancomycin (VANCOCIN) 1-5 GM/200ML-% SOLN, Inject 200 mLs (1,000 mg total) into the vein every Monday, Wednesday, and Friday with hemodialysis., Disp: 4000 mL, Rfl:   Vitals   Vitals:   04/20/21 0700 04/20/21 0756 04/20/21 0758  BP:  (!) 166/77   Pulse:  94   Resp:  15   SpO2:  95%   Weight: 117 kg    Height:   6' (1.829 m)     Body mass index is 34.98 kg/m.  Physical Exam   Physical Exam Gen: A&O x3,  NAD HEENT: Atraumatic, normocephalic;mucous membranes moist; oropharynx clear, tongue without atrophy or fasciculations. Neck: Supple, trachea midline. Resp: CTAB, no w/r/r CV: RRR, no m/g/r; nml S1 and S2. 2+ symmetric peripheral pulses. Abd: soft/NT/ND; nabs x 4 quad Extrem: Nml bulk; no cyanosis, clubbing, or edema.  Neuro: *MS: A&O x3. Follows multi-step commands.  *Speech: mild dysarthria, fluctuating impairment of naming anjd WFD in conversation *CN:    I: Deferred   II,III: PERRLA, VFF by confrontation, optic discs unable to be visualized 2/2 pupillary constriction   III,IV,VI: EOMI w/o nystagmus, no ptosis   V: Sensation intact from V1 to V3 to LT   VII: Eyelid closure was full.  R NLF flattening   VIII: Hearing intact to voice   IX,X: Voice normal, palate elevates symmetrically    XI: SCM/trap 5/5 bilat   XII: Tongue protrudes midline, no atrophy or fasciculations   *Motor:   Normal bulk.  No tremor, rigidity or bradykinesia. No pronator drift.    Strength: Dlt Bic Tri WrE WrF FgS Gr HF KnF KnE PlF DoF    Left 5 5 5 5 5 5 5 5 5 5 5 5     Right 5 5 5 5 5 5 5 5 5 5 5 5     *Sensory: Intact to light touch, pinprick, temperature vibration throughout except chronic sensory deficit L hand. Symmetric. Propioception intact bilat.  No double-simultaneous extinction.  *Coordination:  Finger-to-nose, heel-to-shin, rapid alternating motions were intact. *Reflexes:  2+ and symmetric throughout without clonus; toes down-going bilat *Gait: deferred  NIHSS  1a Level of Conscious.: 0 1b LOC Questions: 0 1c LOC Commands: 0 2 Best Gaze: 0 3 Visual: 0 4 Facial Palsy: 1 5a Motor Arm - left: 0 5b Motor Arm - Right: 0 6a Motor Leg - Left: 0 6b Motor Leg - Right: 0 7 Limb Ataxia: 0 8 Sensory: 1 9 Best Language: 1 10 Dysarthria: 1 11 Extinct. and Inatten.: 0  TOTAL: 4   Premorbid mRS = 0   Labs   CBC:  Recent Labs  Lab 04/20/21 0747 04/20/21 0756  WBC 8.4  --    NEUTROABS 7.1  --   HGB 8.9* 9.2*  HCT 27.6* 27.0*  MCV 89.9  --   PLT 225  --     Basic Metabolic Panel:  Lab Results  Component Value Date   NA 127 (L) 04/20/2021   K 4.8 04/20/2021   CO2 28 10/07/2020   GLUCOSE 322 (H) 04/20/2021   BUN 87 (H) 04/20/2021   CREATININE 12.00 (H) 04/20/2021   CALCIUM 9.0 10/07/2020   GFRNONAA 8 (L) 10/07/2020   GFRAA 14 (L) 07/06/2016   Lipid Panel: No results found for: LDLCALC HgbA1c:  Lab Results  Component Value Date   HGBA1C 6.4 (H) 08/19/2020   Urine Drug Screen:     Component Value Date/Time   LABOPIA NONE DETECTED 10/07/2020 0317   COCAINSCRNUR NONE DETECTED 10/07/2020 0317   LABBENZ NONE DETECTED 10/07/2020 0317   AMPHETMU NONE DETECTED 10/07/2020 0317   THCU NONE DETECTED 10/07/2020 0317   LABBARB NONE DETECTED 10/07/2020 0317    Alcohol Level     Component Value Date/Time   ETH <10 10/06/2020 2036     Impression   61 yo patient with hx ESRD on dialysis, DM2, HTN, HL who presents with fluctuating expressive aphasia and R NLF flattening c/f TIA vs small stroke. Presented outside window for TNK. No LVO and neg CTP. Patient has had prior episodes of expressive aphasia but these were primarily during dialysis.   Recommendations   - Admit to hospitalist service for stroke w/u; stroke team will consult - Permissive HTN x48 hrs from sx onset or until stroke ruled out by MRI goal BP <220/110. PRN labetalol or hydralazine if BP  above these parameters. Avoid oral antihypertensives. - MRI brain wo contrast - TTE w/ bubble - Check A1c and LDL + add statin per guidelines - ASA 325mg  now f/b ASA 81mg  daily - Plavix 75mg  daily x21 days - q4 hr neuro checks - STAT head CT for any change in neuro exam - Tele - PT/OT/SLP - Stroke education - Amb referral to neurology upon discharge - rEEG  Stroke team will continue to follow.   ______________________________________________________________________   Thank you for the  opportunity to take part in the care of this patient. If you have any further questions, please contact the neurology consultation attending.  Signed,  Su Monks, MD [facility] [phone]  If 7pm- 7am, please page neurology on call as listed in [place].

## 2021-04-20 NOTE — H&P (Addendum)
Date: 04/20/2021               Patient Name:  Scott Castillo MRN: [id number]  DOB: 10-Sep-1960 Age / Sex: 61 y.o., male   PCP: Algis Greenhouse, MD         Medical Service: Internal Medicine Teaching Service         Attending Physician: Dr. Evette Doffing, Mallie Mussel, *    First Contact: Buddy Duty, DO Pager: [phone]  Second Contact: Hadassah Pais, MD Pager: Rudean Curt [phone]       After Hours (After 5p/  First Contact Pager: [phone]  weekends / holidays): Second Contact Pager: [phone]   SUBJECTIVE  Chief Complaint: speech difficulty  History of Present Illness: Scott Castillo is a 61 y.o. 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  History obtained by chart review and spouse. Scott Castillo was in his usual state of health until this morning. He awoke around 4AM and was getting ready for work. He endorsed some right arm numbness prior to work and asked her to rub his arm which improved his symptoms. She did not note any dysarthria at that time. Around 7:30AM, she received a call from him and he was noted to have some slurred speech and noted to be confused with new concerns for left sided numbness at work.  Wife reports that patient has been having trouble with a "nerve in his neck" and has not been able to get much sleep over the past few days. He has also had a nonproductive cough since his hospitalization in May 2022. She reports that this was thought to be in setting of pulmonary congestion; however, has not improved with dialysis. He has not missed any hemodialysis sessions recently. She denies any concerns for fevers/chills, complaints of headache, or vision changes. She does note that he occasionally has transient "chest and abdomen cramping" following dialysis sessions.  In the ED, the patient was noted to  have ongoing expressive aphasia and left hand numbness with mild dysarthria. He was evaluated by neurology for code stroke. CT Head and Angio negative for acute intracranial pathology or large vessel occlusion. Patient admitted for stroke work up.   Medications: No current facility-administered medications on file prior to encounter.   Current Outpatient Medications on File Prior to Encounter  Medication Sig Dispense Refill   ascorbic acid (VITAMIN C) 1000 MG tablet Take 1,000 mg by mouth daily.     atorvastatin (LIPITOR) 40 MG tablet Take 40 mg by mouth daily.     co-enzyme Q-10 30 MG capsule Take 30 mg by mouth 2 (two) times daily.     cyanocobalamin 1000 MCG tablet Take 1,000 mcg by mouth daily.     ferric citrate (AURYXIA) 1 GM 210 MG(Fe) tablet Take 210-420 mg by mouth See admin instructions. Take 2 tablets (420 mg) by mouth 3 times daily with meals and 1 tablet (210 mg) with snacks     irbesartan (AVAPRO) 150 MG tablet Take 150 mg by mouth daily.     loperamide (IMODIUM) 2 MG capsule Take 1 capsule (2 mg total) by mouth every 6 (six) hours as needed for diarrhea or loose stools. 30 capsule 0   Semaglutide (RYBELSUS) 14 MG TABS Take 14 mg by mouth daily.     torsemide (DEMADEX) 100 MG tablet Take 100 mg by  mouth See admin instructions. Takes on Tuesday,Thursday,Saturday and Sunday(non-dialysis days)      Past Medical History:  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   H/O pancreatic cancer    HLD (hyperlipidemia)    HTN (hypertension)    Lumbar radiculopathy, chronic    hemilamectomy '94   Venous stasis dermatitis of both lower extremities     Social:  Patient lives at home with his wife. He works in the Geophysicist/field seismologist at [employer]. He is independent in ADLs. No tobacco use,alcohol use or illicit substance use per  wife.  PCP - Dr Teressa Lower   Family History: Family History  Problem Relation Age of Onset   Edema Mother    Heart attack Father    Heart failure Father    Heart attack Brother    Heart attack Brother     Allergies: Allergies as of 04/20/2021 - Review Complete 04/20/2021  Allergen Reaction Noted   Heparin Other (See Comments) 08/15/2020   Pioglitazone Swelling 05/26/2015   Adhesive [tape] Other (See Comments) 09/15/2016   Latex Rash 06/17/2020    Review of Systems: A complete ROS was negative except as per HPI.   OBJECTIVE:  Physical Exam: Blood pressure (!) 201/117, pulse 91, temperature 97.7 F (36.5 C), temperature source Oral, resp. rate 20, height 6' (1.829 m), weight 117 kg, SpO2 93 %. Physical Exam  Constitutional: Obese middle aged male, no acute distress  HENT: Normocephalic and atraumatic, EOMI, conjunctiva normal, moist mucous membranes Cardiovascular: Normal rate, regular rhythm, S1 and S2 present, no murmurs, rubs, gallops.  Distal pulses intact - palpable thrill on L forearm  Respiratory: No respiratory distress, Lungs are clear to auscultation bilaterally. GI: Nondistended, soft, nontender to palpation, normal bowel sounds Musculoskeletal: Normal bulk and tone.  No peripheral edema noted. Neurological: Alert and oriented to self, place and month but unable to recall year or situation. CN II-XII grossly intact. Strength 5/5 in bilateral upper and lower extremities. Sensation intact to light tough. Expressive aphasia  Skin: Warm and dry.  BLE with changes consistent with chronic venous stasis   Pertinent Labs: CBC    Component Value Date/Time   WBC 8.4 04/20/2021 0747   RBC 3.07 (L) 04/20/2021 0747   HGB 9.2 (L) 04/20/2021 0756   HCT 27.0 (L) 04/20/2021 0756   PLT 225 04/20/2021 0747   MCV 89.9 04/20/2021 0747   MCH 29.0 04/20/2021 0747   MCHC 32.2 04/20/2021 0747   RDW 15.9 (H) 04/20/2021 0747   LYMPHSABS 0.5 (L) 04/20/2021 0747   MONOABS 0.5  04/20/2021 0747   EOSABS 0.3 04/20/2021 0747   BASOSABS 0.0 04/20/2021 0747     CMP     Component Value Date/Time   NA 127 (L) 04/20/2021 0756   K 4.8 04/20/2021 0756   CL 94 (L) 04/20/2021 0756   CO2 21 (L) 04/20/2021 0747   GLUCOSE 322 (H) 04/20/2021 0756   BUN 87 (H) 04/20/2021 0756   CREATININE 12.00 (H) 04/20/2021 0756   CALCIUM 9.4 04/20/2021 0747   PROT 6.6 04/20/2021 0747   ALBUMIN 3.1 (L) 04/20/2021 0747   AST 17 04/20/2021 0747   ALT 20 04/20/2021 0747   ALKPHOS 91 04/20/2021 0747   BILITOT 1.0 04/20/2021 0747   GFRNONAA 5 (L) 04/20/2021 0747   GFRAA 14 (L)  07/06/2016 0502     EKG: personally reviewed my interpretation is normal EKG, normal sinus rhythm, normal sinus rhythm  ASSESSMENT & PLAN:   Assessment:  Scott Castillo is a 61 y.o. 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  Of note, patient has had prior similar episodes of expressive aphasia but this was noted mostly following dialysis that was thought to be secondary to dialysis disequilibrium syndrome.  However, patient's current episode was prior to dialysis this morning.  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  Initial CT head negative for acute intracranial abnormality and CTA head and neck negative for large vessel occlusion.  MRI brain obtained that was negative for acute infarct but also with motion degraded; did note premature brain atrophy.  He has not missed any recent HD sessions so doubt that this is uremic encephalopathy.  He was noted to have a mildly  low vitamin B12 with [facility] neurology on 9/22 months was recommended over-the-counter multivitamins.  Unclear if this could be contributing to his symptoms. -Neurology consulted, appreciate their recommendations -Follow-up echo with bubble study -Follow-up EEG -Start dual antiplatelet therapy -Cardiac monitoring -PT/OT eval -Follow-up A1c and lipid panel -Frequent neuro checks -UDS and UA -Permissive hypertension for 48 hours goal BP is <220/110  #ESRD on HD MWF #Anion gap metabolic acidosis  Patient has a history of ESRD on dialysis Monday Wednesday Friday.  Does not have any recent missed HD sessions.  Labs with stable potassium and stable BUN/creatinine. He does have an anion gap acidosis; however, bicarb is 21.  No signs of infection at this time. No emergent need for HD at this time. -Nephrology consulted, appreciate recommendation -HD per nephro -Trend renal function  #Anemia of chronic disease  Hemoglobin stable around baseline. -Trend CBC -Resume home Auryxia  #Hyponatremia  Pseudohyponatremia in setting of hyperglycemia.  Corrected sodium 134. -Trend BMP -Glucose control  #Type II diabetes mellitus  Patient has a history of diabetes for which she is on Rybelsus daily.  Prior A1c of 6.4 8 months ago. -Repeat A1c -SSI sensitive -CBG monitoring  #Hypertension #Hyperlipidemia  Patient has a history of hypertension for which she is on irbesartan 150 mg daily.  He is also on Lipitor 40 mg daily for hyperlipidemia.  Currently holding p.o. antihypertensives in setting of possible stroke and permissive hypertension. -IV metoprolol as needed -Resume home Lipitor 40 mg daily  #Chronic venous stasis Patient with bilateral lower extremity chronic venous stasis.  Does have some superficial wounds that appear to be healing well.  No signs of infection or cellulitis at this time. -WC consult  Best Practice: Diet: Renal diet IVF: Fluids: None, Rate: None VTE: heparin  injection 5,000 Units Start: 04/20/21 1400 Code: Full AB: None Status: Observation with expected length of stay less than 2 midnights. Anticipated Discharge Location: Home Barriers to Discharge:  Stroke work up  Signature: Harvie Heck, MD Internal Medicine Resident, PGY-3 [facility]  Pager: [phone] 11:16 AM, 04/20/2021   Please contact the on call pager after 5 pm and on weekends at [phone].

## 2021-04-20 NOTE — ED Provider Notes (Signed)
[facility] EMERGENCY DEPARTMENT Provider Note   CSN: [id number] Arrival date & time: 04/20/21  7425  An emergency department physician performed an initial assessment on this suspected stroke patient at 46.  History  Chief Complaint  Patient presents with   Code Stroke    Scott Castillo is a 61 y.o. male. Level 5 caveat due to difficulty speaking. HPI Patient with history of hypertension and end-stage renal disease on dialysis.  Presents with some numbness and difficulty speaking.  Came in as a code stroke.  Reportedly last normal at 4 AM, however with further history sounds as if he would to bed normal and woke up with symptoms.  Difficulty speaking.  Also had some right-sided numbness.  He has end-stage renal disease patient due for dialysis today.  No headache.  No confusion.  Hyperglycemic and mildly hypertensive for EMS.    Home Medications Prior to Admission medications   Medication Sig Start Date End Date Taking? Authorizing Provider  ascorbic acid (VITAMIN C) 1000 MG tablet Take 1,000 mg by mouth daily.   Yes [provider]  atorvastatin (LIPITOR) 40 MG tablet Take 40 mg by mouth daily. 04/09/21  Yes [provider]  co-enzyme Q-10 30 MG capsule Take 30 mg by mouth 2 (two) times daily.   Yes [provider]  cyanocobalamin 1000 MCG tablet Take 1,000 mcg by mouth daily.   Yes [provider]  ferric citrate (AURYXIA) 1 GM 210 MG(Fe) tablet Take 210-420 mg by mouth See admin instructions. Take 2 tablets (420 mg) by mouth 3 times daily with meals and 1 tablet (210 mg) with snacks   Yes [provider]  irbesartan (AVAPRO) 150 MG tablet Take 150 mg by mouth daily. 09/05/19  Yes [provider]  loperamide (IMODIUM) 2 MG capsule Take 1 capsule (2 mg total) by mouth every 6 (six) hours as needed for diarrhea or loose stools. 09/08/20  Yes Sheikh, Omair Latif, DO  Semaglutide (RYBELSUS) 14 MG TABS Take 14 mg by  mouth daily. 10/05/18  Yes [provider]  torsemide (DEMADEX) 100 MG tablet Take 100 mg by mouth See admin instructions. Takes on Tuesday,Thursday,Saturday and Sunday(non-dialysis days) 02/20/20  Yes [provider]      Allergies    Heparin, Pioglitazone, Adhesive [tape], and Latex    Review of Systems   Review of Systems  Unable to perform ROS: Mental status change   Physical Exam Updated Vital Signs BP (!) 174/115    Pulse 87    Temp 98.1 F (36.7 C) (Oral)    Resp (!) 21    Ht 6' (1.829 m)    Wt 117 kg    SpO2 96%    BMI 34.98 kg/m  Physical Exam Vitals and nursing note reviewed.  Constitutional:      Appearance: Normal appearance.  Eyes:     Extraocular Movements: Extraocular movements intact.     Pupils: Pupils are equal, round, and reactive to light.  Cardiovascular:     Rate and Rhythm: Regular rhythm.  Chest:     Chest wall: No tenderness.  Abdominal:     Tenderness: There is no abdominal tenderness.  Musculoskeletal:     Cervical back: Neck supple.     Comments: Dialysis graft left upper extremity.  Skin:    General: Skin is warm.  Neurological:     Mental Status: He is alert and oriented to person, place, and time.     Comments: Awake and  appropriate.  Does have some expressive aphasia that somewhat wax and wanes.  Paresthesias in left hand.  Equal strength bilaterally.  Complete NIH scoring done by neurology.    ED Results / Procedures / Treatments   Labs (all labs ordered are listed, but only abnormal results are displayed) Labs Reviewed  PROTIME-INR - Abnormal; Notable for the following components:      Result Value   Prothrombin Time 15.9 (*)    INR 1.3 (*)    All other components within normal limits  CBC - Abnormal; Notable for the following components:   RBC 3.07 (*)    Hemoglobin 8.9 (*)    HCT 27.6 (*)    RDW 15.9 (*)    All other components within normal limits  DIFFERENTIAL - Abnormal; Notable for the following components:    Lymphs Abs 0.5 (*)    All other components within normal limits  COMPREHENSIVE METABOLIC PANEL - Abnormal; Notable for the following components:   Sodium 129 (*)    Chloride 90 (*)    CO2 21 (*)    Glucose, Bld 337 (*)    BUN 83 (*)    Creatinine, Ser 10.93 (*)    Albumin 3.1 (*)    GFR, Estimated 5 (*)    Anion gap 18 (*)    All other components within normal limits  HEMOGLOBIN A1C - Abnormal; Notable for the following components:   Hgb A1c MFr Bld 8.5 (*)    All other components within normal limits  I-STAT CHEM 8, ED - Abnormal; Notable for the following components:   Sodium 127 (*)    Chloride 94 (*)    BUN 87 (*)    Creatinine, Ser 12.00 (*)    Glucose, Bld 322 (*)    Calcium, Ion 1.08 (*)    Hemoglobin 9.2 (*)    HCT 27.0 (*)    All other components within normal limits  CBG MONITORING, ED - Abnormal; Notable for the following components:   Glucose-Capillary 317 (*)    All other components within normal limits  CBG MONITORING, ED - Abnormal; Notable for the following components:   Glucose-Capillary 279 (*)    All other components within normal limits  RESP PANEL BY RT-PCR (FLU A&B, COVID) ARPGX2  APTT  LDL CHOLESTEROL, DIRECT  RAPID URINE DRUG SCREEN, HOSP PERFORMED    EKG EKG Interpretation  Date/Time:  Monday April 20 2021 08:34:25 EST Ventricular Rate:  89 PR Interval:  200 QRS Duration: 93 QT Interval:  370 QTC Calculation: 451 R Axis:   48 Text Interpretation: Sinus rhythm Probable left atrial enlargement Confirmed by Davonna Belling [phone]) on 04/20/2021 8:54:07 AM  Radiology MR BRAIN WO CONTRAST  Result Date: 04/20/2021 CLINICAL DATA:  Neuro deficit with stroke suspected EXAM: MRI HEAD WITHOUT CONTRAST TECHNIQUE: Multiplanar, multiecho pulse sequences of the brain and surrounding structures were obtained without intravenous contrast. COMPARISON:  CTA from earlier the same day FINDINGS: Brain: No acute infarction, hemorrhage, hydrocephalus,  extra-axial collection or mass lesion. Premature brain atrophy. Vascular: Normal flow voids. Skull and upper cervical spine: No gross marrow lesion Sinuses/Orbits: No acute finding. Other: Marked motion degradation which could easily obscure pathology. IMPRESSION: 1. Very motion degraded study.  No acute finding including infarct. 2. Premature brain atrophy. Electronically Signed   By: Jorje Guild M.D.   On: 04/20/2021 09:58   EEG adult  Result Date: 04/20/2021 Lora Havens, MD     04/20/2021 11:45 AM Patient Name: Scott Guice  Castillo MRN: [id number] Epilepsy Attending: Lora Havens Referring Physician/Provider: Dr Su Monks Date: 04/20/2021 Duration: 21.12 mins Patient history: 61 yo patient with hx ESRD on dialysis, DM2, HTN, HL who presents with fluctuating expressive aphasia and R NLF flattening c/f TIA vs small stroke. EEG to evaluate for seizure Level of alertness: Awake AEDs during EEG study: None Technical aspects: This EEG study was done with scalp electrodes positioned according to the 10-20 International system of electrode placement. Electrical activity was acquired at a sampling rate of 500Hz  and reviewed with a high frequency filter of 70Hz  and a low frequency filter of 1Hz . EEG data were recorded continuously and digitally stored. Description: The posterior dominant rhythm consists of 7 Hz activity of moderate voltage (25-35 uV) seen predominantly in posterior head regions, symmetric and reactive to eye opening and eye closing. EEG showed continuous generalized 5 to 7 Hz theta as well intermittent 2-3hz  delta slowing. Hyperventilation and photic stimulation were not performed.   ABNORMALITY - Continuous slow, generalized - Background slow IMPRESSION: This study is suggestive of moderate diffuse encephalopathy, nonspecific etiology. No seizures or epileptiform discharges were seen throughout the recording. Lora Havens   CT HEAD CODE STROKE WO CONTRAST  Result Date:  04/20/2021 CLINICAL DATA:  Code stroke. EXAM: CT HEAD WITHOUT CONTRAST TECHNIQUE: Contiguous axial images were obtained from the base of the skull through the vertex without intravenous contrast. COMPARISON:  Head CT 10/06/2020 FINDINGS: Brain: No evidence of acute infarction, hemorrhage, hydrocephalus, extra-axial collection or mass lesion/mass effect. Generalized atrophy. Vascular: No hyperdense vessel.  Extensive arterial calcification. Skull: Normal. Negative for fracture or focal lesion. Sinuses/Orbits: No acute finding. Other: These results were communicated to Dr Quinn Axe at 8:01 am on 04/20/2021 by text page via the [facility] messaging system. ASPECTS [facility] Stroke Program Early CT Score) Not scored without localizing symptom IMPRESSION: 1. No acute finding. 2. Premature brain atrophy. Electronically Signed   By: Jorje Guild M.D.   On: 04/20/2021 08:02   CT ANGIO HEAD NECK W WO CM W PERF (CODE STROKE)  Result Date: 04/20/2021 CLINICAL DATA:  Acute stroke suspected.  Expressive aphasia. EXAM: CT ANGIOGRAPHY HEAD AND NECK CT PERFUSION BRAIN TECHNIQUE: Multidetector CT imaging of the head and neck was performed using the standard protocol during bolus administration of intravenous contrast. Multiplanar CT image reconstructions and MIPs were obtained to evaluate the vascular anatomy. Carotid stenosis measurements (when applicable) are obtained utilizing NASCET criteria, using the distal internal carotid diameter as the denominator. Multiphase CT imaging of the brain was performed following IV bolus contrast injection. Subsequent parametric perfusion maps were calculated using RAPID software. CONTRAST:  176mL OMNIPAQUE IOHEXOL 350 MG/ML SOLN COMPARISON:  Noncontrast head CT from earlier today. Head CTA 06/16/2020 FINDINGS: CTA NECK FINDINGS Aortic arch: Unremarkable.  Three vessel branching. Right carotid system: Calcified plaque at the bifurcation without stenosis or ulceration. Left carotid system: Calcified  plaque at the bifurcation primarily. No stenosis or ulceration. Vertebral arteries: Proximal subclavian atheromatous plaque. The vertebral arteries are smoothly contoured and widely patent to the dura. Skeleton: Multilevel cervical spine degeneration. Other neck: Chronic left sphenoid sinusitis. No acute soft tissue finding. Upper chest: Interlobular septal thickening, airway cuffing, ground-glass opacity, and dependent right pleural fluid Review of the MIP images confirms the above findings CTA HEAD FINDINGS Anterior circulation: Confluent atheromatous calcification on the carotid siphons. No branch occlusion, flow reducing stenosis, or aneurysm affecting [place] vessels. Posterior circulation: Thin calcified plaque on the V4 segments. The vertebral and basilar arteries are  smoothly contoured and widely patent. Hypoplastic left P1 segment. No branch occlusion, beading, or aneurysm. Venous sinuses: Unremarkable Anatomic variants: Unremarkable Review of the MIP images confirms the above findings CT Brain Perfusion Findings: ASPECTS: 10 CBF (<30%) Volume: 44mL Perfusion (Tmax>6.0s) volume: 65mL IMPRESSION: 1. No emergent vascular finding. 2. Atherosclerosis without flow limiting stenosis of Oravec vessels. 3. CHF findings in the visible chest. Electronically Signed   By: Jorje Guild M.D.   On: 04/20/2021 08:26    Procedures Procedures    Medications Ordered in ED Medications  aspirin tablet 325 mg (325 mg Oral Given 04/20/21 0903)    Followed by  aspirin chewable tablet 81 mg (has no administration in time range)  clopidogrel (PLAVIX) tablet 75 mg (75 mg Oral Given 04/20/21 0903)  metoprolol tartrate (LOPRESSOR) injection 5 mg (5 mg Intravenous Given 04/20/21 1024)  heparin injection 5,000 Units (5,000 Units Subcutaneous Given 04/20/21 1144)  sodium chloride flush (NS) 0.9 % injection 3 mL (3 mLs Intravenous Given 04/20/21 1143)  acetaminophen (TYLENOL) tablet 650 mg (has no administration in time range)    Or   acetaminophen (TYLENOL) suppository 650 mg (has no administration in time range)  ondansetron (ZOFRAN) tablet 4 mg (has no administration in time range)    Or  ondansetron (ZOFRAN) injection 4 mg (has no administration in time range)  atorvastatin (LIPITOR) tablet 40 mg (40 mg Oral Given 04/20/21 1142)  ferric citrate (AURYXIA) tablet 420 mg (420 mg Oral Not Given 04/20/21 1146)  loperamide (IMODIUM) capsule 2 mg (has no administration in time range)  insulin aspart (novoLOG) injection 0-6 Units (4 Units Subcutaneous Given 04/20/21 1143)  ferric citrate (AURYXIA) tablet 210 mg (has no administration in time range)  sodium chloride flush (NS) 0.9 % injection 3 mL (3 mLs Intravenous Given 04/20/21 0817)  iohexol (OMNIPAQUE) 350 MG/ML injection 100 mL (100 mLs Intravenous Contrast Given 04/20/21 0813)  LORazepam (ATIVAN) injection 1 mg (1 mg Intravenous Given 04/20/21 9163)    ED Course/ Medical Decision Making/ A&P                           Medical Decision Making  This patient presents to the ED for concern of stroke, this involves an extensive number of treatment options, and is a complaint that carries with it a high risk of complications and morbidity.  The differential diagnosis includes stroke TIA hypoglycemia encephalopathy.   Co morbidities that complicate the patient evaluation  Diabetes, end-stage renal disease on dialysis.   Additional history obtained:  Additional history obtained from hospital admission records, EMS. External records from outside source obtained and reviewed including hospital admission records, EMS   Lab Tests:  I Ordered, and personally interpreted labs.  The pertinent results include: Hyperglycemia.  End-stage renal disease.   Imaging Studies ordered:  I ordered imaging studies including CT CTA CT perfusion. I independently visualized and interpreted imaging which showed no acute stroke. I agree with the radiologist interpretation   Cardiac  Monitoring:  The patient was maintained on a cardiac monitor.  I personally viewed and interpreted the cardiac monitored which showed an underlying rhythm of: Sinus   Medicines ordered and prescription drug management:   I have reviewed the patients home medicines and have made adjustments as needed        Consultations Obtained:  I requested consultation with the neurology,  and discussed lab and imaging findings as well as pertinent plan - they recommend: Admission to  the hospital for stroke work-up.      Reevaluation:  After the interventions noted above, I reevaluated the patient and found that they have :improved   Patient presented with neurologic deficits.  Initially somewhat difficult to get last normal but appears to been when he went to bed last night.  Difficulty speaking that somewhat wax and wanes.  Not a tPA candidate due to onset of symptoms and overall somewhat mild symptoms.  Also waxing and waning.  Neurology discussed with patient's wife and he had been complaining of numbness on the right side.  That is since improved.  Has had admissions for similar symptoms in the past and thought to be related to his dialysis, however is due for dialysis today.  If anything is a little volume overloaded.  Will admit to hospital.  [facility] medicine.   Dispostion:  After consideration of the diagnostic results and the patients response to treatment, I feel that the patent would benefit from admission to the hospital        Final Clinical Impression(s) / ED Diagnoses Final diagnoses:  End stage renal disease on dialysis [facility])  Aphasia    Rx / DC Orders ED Discharge Orders     None         Davonna Belling, MD 04/20/21 1517

## 2021-04-20 NOTE — Code Documentation (Signed)
Stroke Response Nurse Documentation Code Documentation  Rusk is a 61 y.o. male arriving to [facility] ED via [facility] on 04/20/21 with past medical hx of DM, HLD, HTN, ESRD on HD MWF. On No antithrombotic. Code stroke was activated by EMS.   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 He woke up this morning at 0400 with normal speech though he complained to her his entire right side was weak and right arm numb. On arrival patient with transient aphasia, right facial droop, left hand numbness, dysarthria.   Stroke team at the bedside on patient arrival. Labs drawn and patient cleared for CT by Dr. Alvino Chapel. Patient to CT with team. NIHSS 4, see documentation for details and code stroke times. Patient with right facial droop, left decreased sensation, Expressive aphasia , and dysarthria  on exam.   The following imaging was completed:  CT, CTA head and neck, CTP. Patient is not a candidate for IV Thrombolytic due to outside of window.  Care/Plan: q2h mNIHSS x12h then q4h, permissive HTN <220/110, MRI.  Bedside handoff with ED RN Wendelyn Breslow.    Candace Cruise K  Stroke Response RN

## 2021-04-20 NOTE — Progress Notes (Signed)
EEG complete - results pending 

## 2021-04-20 NOTE — Procedures (Signed)
Patient Name: Scott Castillo  MRN: [id number]  Epilepsy Attending: Lora Havens  Referring Physician/Provider: Dr Su Monks Date: 04/20/2021 Duration: 21.12 mins  Patient history: 61 yo patient with hx ESRD on dialysis, DM2, HTN, HL who presents with fluctuating expressive aphasia and R NLF flattening c/f TIA vs small stroke. EEG to evaluate for seizure  Level of alertness: Awake  AEDs during EEG study: None  Technical aspects: This EEG study was done with scalp electrodes positioned according to the 10-20 International system of electrode placement. Electrical activity was acquired at a sampling rate of 500Hz  and reviewed with a high frequency filter of 70Hz  and a low frequency filter of 1Hz . EEG data were recorded continuously and digitally stored.   Description: The posterior dominant rhythm consists of 7 Hz activity of moderate voltage (25-35 uV) seen predominantly in posterior head regions, symmetric and reactive to eye opening and eye closing. EEG showed continuous generalized 5 to 7 Hz theta as well intermittent 2-3hz  delta slowing. Hyperventilation and photic stimulation were not performed.     ABNORMALITY - Continuous slow, generalized - Background slow   IMPRESSION: This study is suggestive of moderate diffuse encephalopathy, nonspecific etiology. No seizures or epileptiform discharges were seen throughout the recording.  [name] [name]

## 2021-04-20 NOTE — Consult Note (Addendum)
[facility] Renal Consultation Note    Indication for Consultation:  Management of ESRD/hemodialysis; anemia, hypertension/volume and secondary hyperparathyroidism PCP:  HPI: Scott Castillo is a 61 y.o. male with ESRD on hemodialysis MWF at [facility]. Last HD 04/17/2021 ran full treatment left 0.8 kg above OP EDW. He is compliant with HD. H/O high IDWG. PMH: DMT2, HTN, pancreatic cancer, C7 radiculopathy, venous stasis BLE. H/O Intermittent aphasia/confusion on HD -> [facility] admit 2/28 - 06/19/20 with negative work up.   Apparently today he developed slurred speech, L sided numbness at work. He was brought to [facility] for work up for CVA. Na 129 Corrected Na 135 BS 337 K+ 4.8 SCr 10.0 BUN 83 CO2 21 WBC 8.4 HGB 8.9 PLT 225. MRI without acute findings. Premature brain atrophy present. MRI is motion degraded.   Seen in ED. He denies C/O SOB. Speech deliberate, still with numbness L hand. He really cannot tell what happened prior to onset of symptoms other than he was at work, eating crackers. He denies chest pain, SOB, F,C,N, V. Said he had diarrhea over W/E otherwise no C/Os No C/Os at present. No evidence of volume overload by exam. He has been admitted as observation patient for expressive aphasia.   Note: Dialysis disequilibrium syndrome is seen usually only in new dialysis patients when dialysis is initiated at [facility] that are too high or patients returning to HD after being away from treatment for an extended amount of time. Patient has been on dialysis since 2018.   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   H/O pancreatic cancer    HLD (hyperlipidemia)    HTN (hypertension)    Lumbar radiculopathy, chronic    hemilamectomy '94   Venous stasis dermatitis of both lower extremities    Past Surgical History:   Procedure Laterality Date   A/V FISTULAGRAM Left 09/27/2019   Procedure: A/V FISTULAGRAM;  Surgeon: Marty Heck, MD;  Location: [place] [facility];  Service: Cardiovascular;  Laterality: Left;   AV FISTULA PLACEMENT Left 09/17/2016   Procedure: ARTERIOVENOUS (AV) FISTULA CREATION;  Surgeon: [name], MD;  Location: [facility] OR;  Service: Vascular;  Laterality: Left;   CATARACT EXTRACTION W/ INTRAOCULAR LENS IMPLANT Left 2015   ENDOVENOUS ABLATION SAPHENOUS VEIN W/ LASER Right 04/08/2016   EVLA R greater saphenous vein by Curt Jews MD   ENDOVENOUS ABLATION SAPHENOUS VEIN W/ LASER Left 05/13/2016   endovenous laser ablation (left greater saphenous vein) by Curt Jews MD    FOOT SURGERY     HEMILAMINOTOMY LUMBAR SPINE  '94   TONSILLECTOMY     Family History  Problem Relation Age of Onset   Edema Mother    Heart attack Father    Heart failure Father    Heart attack Brother    Heart attack Brother    Social History:  reports that he has never smoked. He has never used smokeless tobacco. He reports that he does not drink alcohol and does not use drugs. Allergies  Allergen Reactions   Heparin Other (See Comments)    Per pt he has episodes where he can't speak. He has tolerated this med multiple times since this entered so unlikely it is true allergy   Pioglitazone Swelling    Site of swelling not recalled by  patient   Adhesive [Tape] Other (See Comments)    Causes redness, pt prefers paper tape    Latex Rash    Redness, also   Prior to Admission medications   Medication Sig Start Date End Date Taking? Authorizing Provider  ascorbic acid (VITAMIN C) 1000 MG tablet Take 1,000 mg by mouth daily.   Yes [provider]  atorvastatin (LIPITOR) 40 MG tablet Take 40 mg by mouth daily. 04/09/21  Yes [provider]  co-enzyme Q-10 30 MG capsule Take 30 mg by mouth 2 (two) times daily.   Yes [provider]  cyanocobalamin 1000 MCG tablet Take 1,000 mcg by  mouth daily.   Yes [provider]  ferric citrate (AURYXIA) 1 GM 210 MG(Fe) tablet Take 210-420 mg by mouth See admin instructions. Take 2 tablets (420 mg) by mouth 3 times daily with meals and 1 tablet (210 mg) with snacks   Yes [provider]  irbesartan (AVAPRO) 150 MG tablet Take 150 mg by mouth daily. 09/05/19  Yes [provider]  loperamide (IMODIUM) 2 MG capsule Take 1 capsule (2 mg total) by mouth every 6 (six) hours as needed for diarrhea or loose stools. 09/08/20  Yes Sheikh, Omair Latif, DO  Semaglutide (RYBELSUS) 14 MG TABS Take 14 mg by mouth daily. 10/05/18  Yes [provider]  torsemide (DEMADEX) 100 MG tablet Take 100 mg by mouth See admin instructions. Takes on Tuesday,Thursday,Saturday and Sunday(non-dialysis days) 02/20/20  Yes [provider]   Current Facility-Administered Medications  Medication Dose Route Frequency Provider Last Rate Last Admin   acetaminophen (TYLENOL) tablet 650 mg  650 mg Oral Q6H PRN Aslam, Loralyn Freshwater, MD       Or   acetaminophen (TYLENOL) suppository 650 mg  650 mg Rectal Q6H PRN Harvie Heck, MD       [START ON 04/21/2021] aspirin chewable tablet 81 mg  81 mg Oral Daily Derek Jack, MD       atorvastatin (LIPITOR) tablet 40 mg  40 mg Oral Daily Aslam, Loralyn Freshwater, MD   40 mg at 04/20/21 1142   clopidogrel (PLAVIX) tablet 75 mg  75 mg Oral Daily Derek Jack, MD   75 mg at 04/20/21 0932   ferric citrate (AURYXIA) tablet 210 mg  210 mg Oral PRN Sid Falcon, MD       ferric citrate (AURYXIA) tablet 420 mg  420 mg Oral TID WC Aslam, Loralyn Freshwater, MD       heparin injection 5,000 Units  5,000 Units Subcutaneous Q8H Aslam, Loralyn Freshwater, MD   5,000 Units at 04/20/21 1144   insulin aspart (novoLOG) injection 0-6 Units  0-6 Units Subcutaneous TID WC Harvie Heck, MD   4 Units at 04/20/21 1143   loperamide (IMODIUM) capsule 2 mg  2 mg Oral Q6H PRN Harvie Heck, MD       metoprolol tartrate (LOPRESSOR) injection 5 mg  5 mg  Intravenous PRN Harvie Heck, MD   5 mg at 04/20/21 1024   ondansetron (ZOFRAN) tablet 4 mg  4 mg Oral Q6H PRN Aslam, Loralyn Freshwater, MD       Or   ondansetron (ZOFRAN) injection 4 mg  4 mg Intravenous Q6H PRN Aslam, Sadia, MD       sodium chloride flush (NS) 0.9 % injection 3 mL  3 mL Intravenous Q12H Harvie Heck, MD   3 mL at 04/20/21 1143   Current Outpatient Medications  Medication Sig Dispense Refill   ascorbic acid (VITAMIN C) 1000  MG tablet Take 1,000 mg by mouth daily.     atorvastatin (LIPITOR) 40 MG tablet Take 40 mg by mouth daily.     co-enzyme Q-10 30 MG capsule Take 30 mg by mouth 2 (two) times daily.     cyanocobalamin 1000 MCG tablet Take 1,000 mcg by mouth daily.     ferric citrate (AURYXIA) 1 GM 210 MG(Fe) tablet Take 210-420 mg by mouth See admin instructions. Take 2 tablets (420 mg) by mouth 3 times daily with meals and 1 tablet (210 mg) with snacks     irbesartan (AVAPRO) 150 MG tablet Take 150 mg by mouth daily.     loperamide (IMODIUM) 2 MG capsule Take 1 capsule (2 mg total) by mouth every 6 (six) hours as needed for diarrhea or loose stools. 30 capsule 0   Semaglutide (RYBELSUS) 14 MG TABS Take 14 mg by mouth daily.     torsemide (DEMADEX) 100 MG tablet Take 100 mg by mouth See admin instructions. Takes on Tuesday,Thursday,Saturday and Sunday(non-dialysis days)     Labs: Basic Metabolic Panel: Recent Labs  Lab 04/20/21 0747 04/20/21 0756  NA 129* 127*  K 4.8 4.8  CL 90* 94*  CO2 21*  --   GLUCOSE 337* 322*  BUN 83* 87*  CREATININE 10.93* 12.00*  CALCIUM 9.4  --    Liver Function Tests: Recent Labs  Lab 04/20/21 0747  AST 17  ALT 20  ALKPHOS 91  BILITOT 1.0  PROT 6.6  ALBUMIN 3.1*   No results for input(s): LIPASE, AMYLASE in the last 168 hours. No results for input(s): AMMONIA in the last 168 hours. CBC: Recent Labs  Lab 04/20/21 0747 04/20/21 0756  WBC 8.4  --   NEUTROABS 7.1  --   HGB 8.9* 9.2*  HCT 27.6* 27.0*  MCV 89.9  --   PLT 225  --     Cardiac Enzymes: No results for input(s): CKTOTAL, CKMB, CKMBINDEX, TROPONINI in the last 168 hours. CBG: Recent Labs  Lab 04/20/21 0745 04/20/21 1128  GLUCAP 317* 279*   Iron Studies: No results for input(s): IRON, TIBC, TRANSFERRIN, FERRITIN in the last 72 hours. Studies/Results: MR BRAIN WO CONTRAST  Result Date: 04/20/2021 CLINICAL DATA:  Neuro deficit with stroke suspected EXAM: MRI HEAD WITHOUT CONTRAST TECHNIQUE: Multiplanar, multiecho pulse sequences of the brain and surrounding structures were obtained without intravenous contrast. COMPARISON:  CTA from earlier the same day FINDINGS: Brain: No acute infarction, hemorrhage, hydrocephalus, extra-axial collection or mass lesion. Premature brain atrophy. Vascular: Normal flow voids. Skull and upper cervical spine: No gross marrow lesion Sinuses/Orbits: No acute finding. Other: Marked motion degradation which could easily obscure pathology. IMPRESSION: 1. Very motion degraded study.  No acute finding including infarct. 2. Premature brain atrophy. Electronically Signed   By: Jorje Guild M.D.   On: 04/20/2021 09:58   EEG adult  Result Date: 04/20/2021 Lora Havens, MD     04/20/2021 11:45 AM Patient Name: Scott Castillo MRN: [id number] Epilepsy Attending: Lora Havens Referring Physician/Provider: Dr Su Monks Date: 04/20/2021 Duration: 21.12 mins Patient history: 61 yo patient with hx ESRD on dialysis, DM2, HTN, HL who presents with fluctuating expressive aphasia and R NLF flattening c/f TIA vs small stroke. EEG to evaluate for seizure Level of alertness: Awake AEDs during EEG study: None Technical aspects: This EEG study was done with scalp electrodes positioned according to the 10-20 International system of electrode placement. Electrical activity was acquired at a sampling rate of 500Hz  and  reviewed with a high frequency filter of 70Hz  and a low frequency filter of 1Hz . EEG data were recorded continuously and digitally stored.  Description: The posterior dominant rhythm consists of 7 Hz activity of moderate voltage (25-35 uV) seen predominantly in posterior head regions, symmetric and reactive to eye opening and eye closing. EEG showed continuous generalized 5 to 7 Hz theta as well intermittent 2-3hz  delta slowing. Hyperventilation and photic stimulation were not performed.   ABNORMALITY - Continuous slow, generalized - Background slow IMPRESSION: This study is suggestive of moderate diffuse encephalopathy, nonspecific etiology. No seizures or epileptiform discharges were seen throughout the recording. Lora Havens   CT HEAD CODE STROKE WO CONTRAST  Result Date: 04/20/2021 CLINICAL DATA:  Code stroke. EXAM: CT HEAD WITHOUT CONTRAST TECHNIQUE: Contiguous axial images were obtained from the base of the skull through the vertex without intravenous contrast. COMPARISON:  Head CT 10/06/2020 FINDINGS: Brain: No evidence of acute infarction, hemorrhage, hydrocephalus, extra-axial collection or mass lesion/mass effect. Generalized atrophy. Vascular: No hyperdense vessel.  Extensive arterial calcification. Skull: Normal. Negative for fracture or focal lesion. Sinuses/Orbits: No acute finding. Other: These results were communicated to Dr Quinn Axe at 8:01 am on 04/20/2021 by text page via the [employer] messaging system. ASPECTS [facility] Stroke Program Early CT Score) Not scored without localizing symptom IMPRESSION: 1. No acute finding. 2. Premature brain atrophy. Electronically Signed   By: Jorje Guild M.D.   On: 04/20/2021 08:02   CT ANGIO HEAD NECK W WO CM W PERF (CODE STROKE)  Result Date: 04/20/2021 CLINICAL DATA:  Acute stroke suspected.  Expressive aphasia. EXAM: CT ANGIOGRAPHY HEAD AND NECK CT PERFUSION BRAIN TECHNIQUE: Multidetector CT imaging of the head and neck was performed using the standard protocol during bolus administration of intravenous contrast. Multiplanar CT image reconstructions and MIPs were obtained to evaluate the  vascular anatomy. Carotid stenosis measurements (when applicable) are obtained utilizing NASCET criteria, using the distal internal carotid diameter as the denominator. Multiphase CT imaging of the brain was performed following IV bolus contrast injection. Subsequent parametric perfusion maps were calculated using RAPID software. CONTRAST:  193mL OMNIPAQUE IOHEXOL 350 MG/ML SOLN COMPARISON:  Noncontrast head CT from earlier today. Head CTA 06/16/2020 FINDINGS: CTA NECK FINDINGS Aortic arch: Unremarkable.  Three vessel branching. Right carotid system: Calcified plaque at the bifurcation without stenosis or ulceration. Left carotid system: Calcified plaque at the bifurcation primarily. No stenosis or ulceration. Vertebral arteries: Proximal subclavian atheromatous plaque. The vertebral arteries are smoothly contoured and widely patent to the dura. Skeleton: Multilevel cervical spine degeneration. Other neck: Chronic left sphenoid sinusitis. No acute soft tissue finding. Upper chest: Interlobular septal thickening, airway cuffing, ground-glass opacity, and dependent right pleural fluid Review of the MIP images confirms the above findings CTA HEAD FINDINGS Anterior circulation: Confluent atheromatous calcification on the carotid siphons. No branch occlusion, flow reducing stenosis, or aneurysm affecting [place] vessels. Posterior circulation: Thin calcified plaque on the V4 segments. The vertebral and basilar arteries are smoothly contoured and widely patent. Hypoplastic left P1 segment. No branch occlusion, beading, or aneurysm. Venous sinuses: Unremarkable Anatomic variants: Unremarkable Review of the MIP images confirms the above findings CT Brain Perfusion Findings: ASPECTS: 10 CBF (<30%) Volume: 60mL Perfusion (Tmax>6.0s) volume: 28mL IMPRESSION: 1. No emergent vascular finding. 2. Atherosclerosis without flow limiting stenosis of [place] vessels. 3. CHF findings in the visible chest. Electronically Signed   By: Jorje Guild M.D.   On: 04/20/2021 08:26    ROS: As per HPI otherwise negative.  Physical Exam: Vitals:   04/20/21 1300 04/20/21 1430 04/20/21 1500 04/20/21 1530  BP: (!) 182/108 (!) 174/115 (!) 187/106 (!) 172/97  Pulse: 84 87 78 76  Resp: (!) 23 (!) 21 (!) 24 15  Temp:      TempSrc:      SpO2: 95% 96% 91% 93%  Weight:      Height:         General: Well developed, well nourished, in no acute distress. Head: Normocephalic, atraumatic, sclera non-icteric, mucus membranes are moist Neck: Supple. JVD not elevated. Lungs: Clear bilaterally to auscultation without wheezes, rales, or rhonchi. Breathing is unlabored. Heart: RRR with S1 S2. No murmurs, rubs, or gallops appreciated. Abdomen: Soft, non-tender, non-distended with normoactive bowel sounds. No rebound/guarding. No obvious abdominal masses. M-S:  Strength and tone appear normal for age. Lower extremities: No LE edema. Old venous stasis scarring present.  Neuro: Alert and oriented X 3. Moves all extremities spontaneously. Psych:  Responds to questions appropriately with a normal affect. Dialysis Access: L AVF + T/B  Dialysis Orders: [facility] MWF 4 hr 15 min 200NRe 400/800 108.3 kg 2.0 K/ 2.0 Ca UFP 2 AVF -No Heparin  -Mircera 225 mcg IV q 2 weeks (04/15/2021) -Venofer 100 mg IV X 10 doses (4/10 doses have been given).  -Calcitriol 2.0 mcg PO TIW   Assessment/Plan:  Expressive aphasia: Per neurology note, TIA vs small CVA. Per primary.   ESRD -  MWF. Holding HD today. Will have HD off schedule tomorrow and again 04/22/2020 to resume T,Th,S schedule.   Hypertension/volume  - Hypertensive at present but allowing for permissive hypertension for next 48 hrs per stroke team. Not overtly volume overloaded by exam. H/O high gains. UF as tolerated.   Anemia  - HGB 8.9. Recent OP ESA dose. Continue Fe load.   Metabolic bone disease -  H/O noncompliance with binders. Continue Auryxia binders. Corrected Calcium 10.1. Decrease calcitriol  dose.   Nutrition -Renal/Carb Mod diet. Albumin 3.1. Add protein supps. DMT2-per primary  Jimmye Norman. Owens Shark, NP-C 04/20/2021, 4:05 PM  [employer] [phone]    Seen and examined independently.  Agree with note and exam as documented above by physician extender and as noted here.  Mr. Tungate is a 61 yo gentleman with ESRD on HD p/w slurred speech.  "I wasn't able to complete my sentences".  He denies any shortness of breath.  Here for concern for stroke.   178/109 on room air with HR 86 on my exam  General adult male in bed in no acute distress HEENT normocephalic atraumatic extraocular movements intact sclera anicteric Neck supple trachea midline Lungs clear to auscultation bilaterally normal work of breathing at rest  Heart regular rate and rhythm no rubs or gallops appreciated Abdomen soft nontender nondistended Extremities no edema; missing a digit on left foot and left lower leg is wrapped. Chronic venous stasis changes  Psych normal mood and affect Neuro - he is conversant and alert and oriented x 3 for me  Access left AVF with bruit and thrill   Expressive aphasia  - per neurology  - concern for TIA vs small CVA  ESRD - no acute need for HD today and concern for acute CVA/TIA - defer HD today and plan for treatment tomorrow   HTN - permissive HTN at present; per primary team and neurology   Anemia CKD  - recently received ESA outpatient.  hold ESA here in setting of concern for acute CVA  Metabolic bone  disease - as above, auryxia and calcitriol   Claudia Desanctis, MD 04/20/2021  6:50 PM

## 2021-04-20 NOTE — ED Notes (Signed)
Admitting MD notified of pt bp of 201/117. MD states she will order PRN med for hypertension after she sees him. Will continue to monitor.

## 2021-04-20 NOTE — ED Notes (Signed)
EEG at bedside.

## 2021-04-20 NOTE — ED Triage Notes (Signed)
Code stroke with main symptom of slurred speech. Pt is alert and oriented x 4.

## 2021-04-20 NOTE — ED Notes (Signed)
Lopressor IVP given for bp. Will continue to monitor.

## 2021-04-20 NOTE — ED Notes (Signed)
Taken to MRI by transporter.

## 2021-04-21 ENCOUNTER — Other Ambulatory Visit: Payer: Self-pay

## 2021-04-21 ENCOUNTER — Encounter (HOSPITAL_COMMUNITY): Payer: Self-pay | Admitting: Internal Medicine

## 2021-04-21 ENCOUNTER — Observation Stay (HOSPITAL_BASED_OUTPATIENT_CLINIC_OR_DEPARTMENT_OTHER): Payer: Medicare Other

## 2021-04-21 ENCOUNTER — Observation Stay (HOSPITAL_COMMUNITY): Payer: Medicare Other

## 2021-04-21 DIAGNOSIS — I10 Essential (primary) hypertension: Secondary | ICD-10-CM | POA: Diagnosis not present

## 2021-04-21 DIAGNOSIS — R4701 Aphasia: Secondary | ICD-10-CM | POA: Diagnosis not present

## 2021-04-21 DIAGNOSIS — F801 Expressive language disorder: Secondary | ICD-10-CM | POA: Diagnosis not present

## 2021-04-21 LAB — RENAL FUNCTION PANEL
Albumin: 3 g/dL — ABNORMAL LOW (ref 3.5–5.0)
Anion gap: 17 — ABNORMAL HIGH (ref 5–15)
BUN: 90 mg/dL — ABNORMAL HIGH (ref 6–20)
CO2: 21 mmol/L — ABNORMAL LOW (ref 22–32)
Calcium: 9.4 mg/dL (ref 8.9–10.3)
Chloride: 90 mmol/L — ABNORMAL LOW (ref 98–111)
Creatinine, Ser: 12.06 mg/dL — ABNORMAL HIGH (ref 0.61–1.24)
GFR, Estimated: 4 mL/min — ABNORMAL LOW (ref >60)
Glucose, Bld: 207 mg/dL — ABNORMAL HIGH (ref 70–99)
Phosphorus: 9 mg/dL — ABNORMAL HIGH (ref 2.5–4.6)
Potassium: 5.1 mmol/L (ref 3.5–5.1)
Sodium: 128 mmol/L — ABNORMAL LOW (ref 135–145)

## 2021-04-21 LAB — ECHOCARDIOGRAM COMPLETE BUBBLE STUDY
AR max vel: 2.59 cm2
AV Area VTI: 2.36 cm2
AV Area mean vel: 2.41 cm2
AV Mean grad: 4 mmHg
AV Peak grad: 8.1 mmHg
Ao pk vel: 1.42 m/s
Area-P 1/2: 4.89 cm2
Calc EF: 37.7 %
MV M vel: 5.97 m/s
MV Peak grad: 142.6 mmHg
Radius: 0.7 cm
S' Lateral: 3.8 cm
Single Plane A2C EF: 39.3 %
Single Plane A4C EF: 31.5 %

## 2021-04-21 LAB — GLUCOSE, CAPILLARY
Glucose-Capillary: 195 mg/dL — ABNORMAL HIGH (ref 70–99)
Glucose-Capillary: 203 mg/dL — ABNORMAL HIGH (ref 70–99)
Glucose-Capillary: 115 mg/dL — ABNORMAL HIGH (ref 70–99)
Glucose-Capillary: 154 mg/dL — ABNORMAL HIGH (ref 70–99)

## 2021-04-21 LAB — CBC
HCT: 26.4 % — ABNORMAL LOW (ref 39.0–52.0)
Hemoglobin: 8.5 g/dL — ABNORMAL LOW (ref 13.0–17.0)
MCH: 28.5 pg (ref 26.0–34.0)
MCHC: 32.2 g/dL (ref 30.0–36.0)
MCV: 88.6 fL (ref 80.0–100.0)
Platelets: 229 10*3/uL (ref 150–400)
RBC: 2.98 MIL/uL — ABNORMAL LOW (ref 4.22–5.81)
RDW: 16.5 % — ABNORMAL HIGH (ref 11.5–15.5)
WBC: 9.7 10*3/uL (ref 4.0–10.5)
nRBC: 0 % (ref 0.0–0.2)

## 2021-04-21 LAB — PROTIME-INR
INR: 1.3 — ABNORMAL HIGH (ref 0.8–1.2)
Prothrombin Time: 16.2 seconds — ABNORMAL HIGH (ref 11.4–15.2)

## 2021-04-21 LAB — HEPATITIS B SURFACE ANTIBODY,QUALITATIVE: Hep B S Ab: REACTIVE — AB

## 2021-04-21 LAB — HEPATITIS B SURFACE ANTIGEN: Hepatitis B Surface Ag: NONREACTIVE

## 2021-04-21 MED ORDER — MUPIROCIN CALCIUM 2 % EX CREA
TOPICAL_CREAM | Freq: Every day | CUTANEOUS | Status: DC
  Filled 2021-04-21: qty 15

## 2021-04-21 MED ORDER — LORAZEPAM 2 MG/ML IJ SOLN
1.0000 mg | Freq: Once | INTRAMUSCULAR | Status: AC
  Administered 2021-04-21: 1 mg via INTRAVENOUS
  Filled 2021-04-21: qty 1

## 2021-04-21 MED ORDER — PERFLUTREN LIPID MICROSPHERE
1.0000 mL | INTRAVENOUS | Status: AC | PRN
  Administered 2021-04-21: 2 mL via INTRAVENOUS
  Filled 2021-04-21: qty 10

## 2021-04-21 MED ORDER — INSULIN GLARGINE-YFGN 100 UNIT/ML ~~LOC~~ SOLN
5.0000 [IU] | Freq: Every day | SUBCUTANEOUS | Status: DC
  Administered 2021-04-21: 5 [IU] via SUBCUTANEOUS
  Filled 2021-04-21 (×2): qty 0.05

## 2021-04-21 NOTE — Hospital Course (Addendum)
#  Expressive aphasia  Patient presented with acute onset expressive aphasia and concerns for left-sided numbness.  Of note, patient has had prior similar episodes of expressive aphasia but this was noted mostly following dialysis that was thought to be secondary to dialysis disequilibrium syndrome.  However, patient's current episode was prior to dialysis this morning. 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  Initial CT head negative for acute intracranial abnormality and CTA head and neck negative for large vessel occlusion.  Initial MRI brain obtained that was negative for acute infarct but also noted to be motion degraded; did note premature brain atrophy. Repeat MRI on 1/3 confirmed no acute infarction. Will discharge with aspirin 81 mg daily and atorvastatin 40 mg daily. No need for DAPT as acute infarction was ruled out. PT/OT recommending outpatient therapy, which was arranged for the patient.  #ESRD on HD MWF #Anion gap metabolic acidosis  Patient has a history of ESRD on dialysis Monday Wednesday Friday.  Does not have any recent missed HD sessions.  Labs with stable electrolytes on admission. He did have an anion gap acidosis; however, bicarb was 21.  No signs of infection. No emergent need for HD initially and he received dialysis off schedule, on Tuesday 1/3. Will resume normal HD schedule on discharge.  #Combined chronic systolic and diastolic heart failure Echo this admission with reduced EF of 35-40% (reduced from 45-50% in May), grade III diastolic dysfunction (previously grade I), LA dilatation, new RA dilatation, and new elevated pulmonary artery systolic pressure. Patient has not had an ischemic evaluation/workup by cardiology in the past. Will refer to outpatient cardiology regarding this workup and for optimization of heart failure medications. Will  continue home irbesartan and carvedilol on discharge.   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 He has not had imaging to confirm this, but has reportedly followed with neurology in the past. Would recommend CT imaging of cervical spine to confirm diagnosis, and possible referral back to neurology vs neurosurgery, as numbness/tingling is very distressing to the patient.  #Anemia of chronic disease  Hemoglobin stable around baseline. Did not require any transfusions.   #Type II diabetes mellitus  Patient has a history of diabetes for which he is on Rybelsus daily.  Prior A1c of 6.4 8 months ago, elevated at 8.5 this admission, although could still be falsely low in the setting of ESRD on HD. Recommend close follow up with PCP for medication optimization.   #Hypertension #Hyperlipidemia  Patient has a history of hypertension for which she is on irbesartan 150 mg daily.  He is also on Lipitor 40 mg daily for hyperlipidemia.  Held p.o. antihypertensives in setting of possible stroke and permissive hypertension; resumed high intensity statin. Discharged back on home irbesartan and carvedilol, as well as statin.   #Chronic venous stasis dermatitis Patient with bilateral lower extremity chronic venous stasis.  Does have some superficial wounds that appear to be healing well.  No signs of infection or cellulitis at this time. Wound care consulted during hospitalization.

## 2021-04-21 NOTE — TOC Transition Note (Signed)
Transition of Care [facility]) - CM/SW Discharge Note   Patient Details  Name: ABDULHAMID OLGIN MRN: [id number] Date of Birth: [birth year]  [facility] Contact:  Pollie Friar, RN Phone Number: 04/21/2021, 12:27 PM   Clinical Narrative:    Patient is from home with spouse that works during the day. Patient does the driving for home as his wife's car was in an accident.  Pt is responsible for his own medications. He denies any issues at home.  Recommendations for outpatient therapy. Pt prefers to attend in [facility]. Orders faxed to [facility] Outpatient therapy: [phone]. Information on the AVS. Pt has transport home when medically ready.    Final next level of care: OP Rehab Barriers to Discharge: No Barriers Identified   Patient Goals and CMS Choice     Choice offered to / list presented to : Patient  Discharge Placement                       Discharge Plan and Services                                     Social Determinants of Health (SDOH) Interventions     Readmission Risk Interventions Readmission Risk Prevention Plan 06/19/2020  Transportation Screening Complete  PCP or Specialist Appt within 3-5 Days Complete  HRI or [place] Complete  Social Work Consult for [place] Planning/Counseling Complete  Palliative Care Screening Complete  Medication Review [employer]) Referral to Pharmacy  Some recent data might be hidden

## 2021-04-21 NOTE — Progress Notes (Addendum)
[facility] Progress Note   Subjective: Seen on HD. C/O numbness in L hand but this has been present prior to admission. Do not believe this is related to AVF. Says he saw neurologist at OP but only started on B vits. Referral to Neurosurgeon??? Will disc with primary.   High goal which is not unusual for him. Last BS 203. Still OBS pt.   Objective Vitals:   04/21/21 1145 04/21/21 1242 04/21/21 1255 04/21/21 1300  BP: (!) 183/99 (!) 180/105 (!) 184/112 (!) 185/108  Pulse: 87 87  89  Resp: 16 (!) 25  20  Temp: 98.1 F (36.7 C) 98.4 F (36.9 C)    TempSrc: Oral Oral    SpO2: 96% 95%  96%  Weight:  112.9 kg    Height:       General: Well developed, well nourished, in no acute distress. Neck: Supple. JVD not elevated. Lungs: Clear bilaterally to auscultation without wheezes, rales, or rhonchi. Breathing is unlabored. Heart: S1,S2 RRR No M/R/G. SR on monitor.  Abdomen: obese, NABS, NT Lower extremities: Trace BLE edema. Trace Pedal edema. Old venous stasis scarring present BLE.  Neuro: Alert and oriented X 3. Moves all extremities spontaneously. Face symmetrical. Grips equal.  Dialysis Access: L AVF + T/B   Additional Objective Labs: Basic Metabolic Panel: Recent Labs  Lab 04/20/21 0747 04/20/21 0756 04/21/21 0207  NA 129* 127* 128*  K 4.8 4.8 5.1  CL 90* 94* 90*  CO2 21*  --  21*  GLUCOSE 337* 322* 207*  BUN 83* 87* 90*  CREATININE 10.93* 12.00* 12.06*  CALCIUM 9.4  --  9.4  PHOS  --   --  9.0*   Liver Function Tests: Recent Labs  Lab 04/20/21 0747 04/21/21 0207  AST 17  --   ALT 20  --   ALKPHOS 91  --   BILITOT 1.0  --   PROT 6.6  --   ALBUMIN 3.1* 3.0*   No results for input(s): LIPASE, AMYLASE in the last 168 hours. CBC: Recent Labs  Lab 04/20/21 0747 04/20/21 0756 04/21/21 0207  WBC 8.4  --  9.7  NEUTROABS 7.1  --   --   HGB 8.9* 9.2* 8.5*  HCT 27.6* 27.0* 26.4*  MCV 89.9  --  88.6  PLT 225  --  229   Blood Culture     Component Value Date/Time   SDES BLOOD RIGHT ANTECUBITAL 10/07/2020 0318   SDES BLOOD RIGHT FOREARM 10/07/2020 0318   SPECREQUEST  10/07/2020 0318    BOTTLES DRAWN AEROBIC AND ANAEROBIC Blood Culture adequate volume   SPECREQUEST  10/07/2020 0318    BOTTLES DRAWN AEROBIC AND ANAEROBIC Blood Culture adequate volume   CULT  10/07/2020 0318    NO GROWTH 5 DAYS Performed at [facility], [place] [address] [place], [place] [address]    CULT  10/07/2020 0318    NO GROWTH 5 DAYS Performed at [facility], [place] [address], [place], [place] [address]    REPTSTATUS 10/12/2020 FINAL 10/07/2020 0318   REPTSTATUS 10/12/2020 FINAL 10/07/2020 0318    Cardiac Enzymes: No results for input(s): CKTOTAL, CKMB, CKMBINDEX, TROPONINI in the last 168 hours. CBG: Recent Labs  Lab 04/20/21 1128 04/20/21 1652 04/20/21 2135 04/21/21 0618 04/21/21 1139  GLUCAP 279* 197* 205* 195* 203*   Iron Studies: No results for input(s): IRON, TIBC, TRANSFERRIN, FERRITIN in the last 72 hours. @lablastinr3 @ Studies/Results: MR BRAIN WO CONTRAST  Result Date: 04/20/2021 CLINICAL  DATA:  Neuro deficit with stroke suspected EXAM: MRI HEAD WITHOUT CONTRAST TECHNIQUE: Multiplanar, multiecho pulse sequences of the brain and surrounding structures were obtained without intravenous contrast. COMPARISON:  CTA from earlier the same day FINDINGS: Brain: No acute infarction, hemorrhage, hydrocephalus, extra-axial collection or mass lesion. Premature brain atrophy. Vascular: Normal flow voids. Skull and upper cervical spine: No gross marrow lesion Sinuses/Orbits: No acute finding. Other: Marked motion degradation which could easily obscure pathology. IMPRESSION: 1. Very motion degraded study.  No acute finding including infarct. 2. Premature brain atrophy. Electronically Signed   By: Jorje Guild M.D.   On: 04/20/2021 09:58   EEG adult  Result Date: 04/20/2021 Lora Havens, MD     04/20/2021 11:45 AM Patient Name:  Scott Castillo MRN: [id number] Epilepsy Attending: Lora Havens Referring Physician/Provider: Dr Su Monks Date: 04/20/2021 Duration: 21.12 mins Patient history: 61 yo patient with hx ESRD on dialysis, DM2, HTN, HL who presents with fluctuating expressive aphasia and R NLF flattening c/f TIA vs small stroke. EEG to evaluate for seizure Level of alertness: Awake AEDs during EEG study: None Technical aspects: This EEG study was done with scalp electrodes positioned according to the 10-20 International system of electrode placement. Electrical activity was acquired at a sampling rate of 500Hz  and reviewed with a high frequency filter of 70Hz  and a low frequency filter of 1Hz . EEG data were recorded continuously and digitally stored. Description: The posterior dominant rhythm consists of 7 Hz activity of moderate voltage (25-35 uV) seen predominantly in posterior head regions, symmetric and reactive to eye opening and eye closing. EEG showed continuous generalized 5 to 7 Hz theta as well intermittent 2-3hz  delta slowing. Hyperventilation and photic stimulation were not performed.   ABNORMALITY - Continuous slow, generalized - Background slow IMPRESSION: This study is suggestive of moderate diffuse encephalopathy, nonspecific etiology. No seizures or epileptiform discharges were seen throughout the recording. Lora Havens   CT HEAD CODE STROKE WO CONTRAST  Result Date: 04/20/2021 CLINICAL DATA:  Code stroke. EXAM: CT HEAD WITHOUT CONTRAST TECHNIQUE: Contiguous axial images were obtained from the base of the skull through the vertex without intravenous contrast. COMPARISON:  Head CT 10/06/2020 FINDINGS: Brain: No evidence of acute infarction, hemorrhage, hydrocephalus, extra-axial collection or mass lesion/mass effect. Generalized atrophy. Vascular: No hyperdense vessel.  Extensive arterial calcification. Skull: Normal. Negative for fracture or focal lesion. Sinuses/Orbits: No acute finding. Other: These  results were communicated to Dr Quinn Axe at 8:01 am on 04/20/2021 by text page via the [facility] messaging system. ASPECTS [facility] Stroke Program Early CT Score) Not scored without localizing symptom IMPRESSION: 1. No acute finding. 2. Premature brain atrophy. Electronically Signed   By: Jorje Guild M.D.   On: 04/20/2021 08:02   CT ANGIO HEAD NECK W WO CM W PERF (CODE STROKE)  Result Date: 04/20/2021 CLINICAL DATA:  Acute stroke suspected.  Expressive aphasia. EXAM: CT ANGIOGRAPHY HEAD AND NECK CT PERFUSION BRAIN TECHNIQUE: Multidetector CT imaging of the head and neck was performed using the standard protocol during bolus administration of intravenous contrast. Multiplanar CT image reconstructions and MIPs were obtained to evaluate the vascular anatomy. Carotid stenosis measurements (when applicable) are obtained utilizing NASCET criteria, using the distal internal carotid diameter as the denominator. Multiphase CT imaging of the brain was performed following IV bolus contrast injection. Subsequent parametric perfusion maps were calculated using RAPID software. CONTRAST:  164mL OMNIPAQUE IOHEXOL 350 MG/ML SOLN COMPARISON:  Noncontrast head CT from earlier today. Head CTA 06/16/2020 FINDINGS:  CTA NECK FINDINGS Aortic arch: Unremarkable.  Three vessel branching. Right carotid system: Calcified plaque at the bifurcation without stenosis or ulceration. Left carotid system: Calcified plaque at the bifurcation primarily. No stenosis or ulceration. Vertebral arteries: Proximal subclavian atheromatous plaque. The vertebral arteries are smoothly contoured and widely patent to the dura. Skeleton: Multilevel cervical spine degeneration. Other neck: Chronic left sphenoid sinusitis. No acute soft tissue finding. Upper chest: Interlobular septal thickening, airway cuffing, ground-glass opacity, and dependent right pleural fluid Review of the MIP images confirms the above findings CTA HEAD FINDINGS Anterior circulation: Confluent  atheromatous calcification on the carotid siphons. No branch occlusion, flow reducing stenosis, or aneurysm affecting Rovira vessels. Posterior circulation: Thin calcified plaque on the V4 segments. The vertebral and basilar arteries are smoothly contoured and widely patent. Hypoplastic left P1 segment. No branch occlusion, beading, or aneurysm. Venous sinuses: Unremarkable Anatomic variants: Unremarkable Review of the MIP images confirms the above findings CT Brain Perfusion Findings: ASPECTS: 10 CBF (<30%) Volume: 90mL Perfusion (Tmax>6.0s) volume: 74mL IMPRESSION: 1. No emergent vascular finding. 2. Atherosclerosis without flow limiting stenosis of Lumbra vessels. 3. CHF findings in the visible chest. Electronically Signed   By: Jorje Guild M.D.   On: 04/20/2021 08:26   Medications:  sodium chloride     sodium chloride     ferric gluconate (FERRLECIT) IVPB      aspirin  81 mg Oral Daily   atorvastatin  40 mg Oral Daily   calcitRIOL  1.5 mcg Oral Q M,W,F-HD   Chlorhexidine Gluconate Cloth  6 each Topical Q0600   clopidogrel  75 mg Oral Daily   ferric citrate  420 mg Oral TID WC   heparin  5,000 Units Subcutaneous Q8H   insulin aspart  0-6 Units Subcutaneous TID WC   insulin glargine-yfgn  5 Units Subcutaneous QHS   mupirocin cream   Topical Daily   sodium chloride flush  3 mL Intravenous Q12H     Dialysis Orders: [facility] MWF 4 hr 15 min 200NRe 400/800 108.3 kg 2.0 K/ 2.0 Ca UFP 2 AVF -No Heparin  -Mircera 225 mcg IV q 2 weeks (04/15/2021) -Venofer 100 mg IV X 10 doses (4/10 doses have been given).  -Calcitriol 2.0 mcg PO TIW     Assessment/Plan:  Expressive aphasia: Resolved. Per primary.   ESRD -  MWF. HD off schedule today and again 04/22/2020 to resume MWF schedule  Hypertension/volume  - Hypertensive at present but allowing for permissive hypertension for next 48 hrs per stroke team. UFG set to 4.5 liters which is not unusual for him.  UF as tolerated.   Anemia  - HGB 8.5  Recent OP ESA dose. Continue Fe load.   Metabolic bone disease - PO4 high . H/O noncompliance with binders. Continue Auryxia binders. Corrected Calcium 10.1. Decrease calcitriol dose.   Nutrition -Renal/Carb Mod diet. Albumin 3.1. Add protein supps. DMT2-per primary  Jimmye Norman. Brown NP-C 04/21/2021, 1:26 PM  [facility] [phone]   Seen and examined independently this am.  Agree with note and exam as documented above by physician extender and as noted here.  He states speech is a little better  General adult male in bed in no acute distress HEENT normocephalic atraumatic extraocular movements intact sclera anicteric Neck supple trachea midline Lungs clear to auscultation bilaterally normal work of breathing at rest on room air Heart S1S2 no rub Abdomen soft nontender nondistended Extremities 1+edema; missing a digit on left foot. Chronic venous stasis changes  Psych normal  mood and affect Neuro - he is more conversant and alert and oriented x 2 for me; states twice that it's 2015 but then knows later that was incorrect.  does have some word finding difficulties Access left AVF with bruit and thrill    Expressive aphasia  - per neurology  - concern for TIA vs small CVA initially    ESRD - HD today off schedule.  Then back to MWF schedule as staffing allows   HTN - permissive HTN at present; per primary team and neurology    Anemia CKD  - recently received ESA outpatient.  hold ESA here in setting of concern for acute CVA   Metabolic bone disease - as above, auryxia and calcitriol     Claudia Desanctis, MD 04/21/2021  1:53 PM

## 2021-04-21 NOTE — Progress Notes (Signed)
Neurology Plan of care Note   NP came to see patient around 11am. He had just finished his Echocardiogram and was headed to HD. Order placed for r/p MRI brain, but will not be done until after HD, so maybe 4-5 pm. Patient asking for something to help him relax in CT. He says he has stood alone. He says he has no further extremity weakness.    O: Current vital signs: BP (!) 188/104 (BP Location: Right Arm)    Pulse 85    Temp 98 F (36.7 C) (Oral)    Resp 20    Ht 6' (1.829 m)    Wt 117 kg    SpO2 96%    BMI 34.98 kg/m  Vital signs in last 24 hours: Temp:  [98 F (36.7 C)-98.6 F (37 C)] 98 F (36.7 C) (01/03 0457) Pulse Rate:  [75-88] 85 (01/03 0457) Resp:  [10-24] 20 (01/03 0457) BP: (166-193)/(78-116) 188/104 (01/03 0457) SpO2:  [91 %-97 %] 96 % (01/03 0457)  GENERAL: Well appearing. Awake, alert in NAD.  Medications  Current Facility-Administered Medications:    0.9 %  sodium chloride infusion, 100 mL, Intravenous, PRN, Valentina Gu, NP   0.9 %  sodium chloride infusion, 100 mL, Intravenous, PRN, Valentina Gu, NP   acetaminophen (TYLENOL) tablet 650 mg, 650 mg, Oral, Q6H PRN **OR** acetaminophen (TYLENOL) suppository 650 mg, 650 mg, Rectal, Q6H PRN, Aslam, Sadia, MD   [COMPLETED] aspirin tablet 325 mg, 325 mg, Oral, Once, 325 mg at 04/20/21 0903 **FOLLOWED BY** aspirin chewable tablet 81 mg, 81 mg, Oral, Daily, Derek Jack, MD, 81 mg at 04/21/21 0857   atorvastatin (LIPITOR) tablet 40 mg, 40 mg, Oral, Daily, Aslam, Sadia, MD, 40 mg at 04/21/21 0857   calcitRIOL (ROCALTROL) capsule 1.5 mcg, 1.5 mcg, Oral, Q M,W,F-HD, Valentina Gu, NP   Chlorhexidine Gluconate Cloth 2 % PADS 6 each, 6 each, Topical, Q0600, Valentina Gu, NP, 6 each at 04/21/21 0636   clopidogrel (PLAVIX) tablet 75 mg, 75 mg, Oral, Daily, Derek Jack, MD, 75 mg at 04/21/21 0857   ferric citrate (AURYXIA) tablet 420 mg, 420 mg, Oral, TID WC, Aslam, Sadia, MD, 420 mg at 04/21/21  0857   ferric citrate (AURYXIA) tablet 630 mg, 630 mg, Oral, PRN, Valentina Gu, NP   ferric gluconate (FERRLECIT) 125 mg in sodium chloride 0.9 % 100 mL IVPB, 125 mg, Intravenous, Q M,W,F-HD, Valentina Gu, NP   heparin injection 5,000 Units, 5,000 Units, Subcutaneous, Q8H, Aslam, Sadia, MD, 5,000 Units at 04/21/21 0636   insulin aspart (novoLOG) injection 0-6 Units, 0-6 Units, Subcutaneous, TID WC, Aslam, Sadia, MD, 2 Units at 04/21/21 1143   insulin glargine-yfgn (SEMGLEE) injection 5 Units, 5 Units, Subcutaneous, QHS, Atway, Rayann N, DO   lidocaine (PF) (XYLOCAINE) 1 % injection 5 mL, 5 mL, Intradermal, PRN, Valentina Gu, NP   lidocaine-prilocaine (EMLA) cream 1 application, 1 application, Topical, PRN, Valentina Gu, NP   loperamide (IMODIUM) capsule 2 mg, 2 mg, Oral, Q6H PRN, Aslam, Sadia, MD   metoprolol tartrate (LOPRESSOR) injection 5 mg, 5 mg, Intravenous, PRN, Aslam, Sadia, MD, 5 mg at 04/20/21 2146   mupirocin cream (BACTROBAN) 2 %, , Topical, Daily, Axel Filler, MD, Given at 04/21/21 1031   ondansetron (ZOFRAN) tablet 4 mg, 4 mg, Oral, Q6H PRN **OR** ondansetron (ZOFRAN) injection 4 mg, 4 mg, Intravenous, Q6H PRN, Aslam, Sadia, MD   pentafluoroprop-tetrafluoroeth (GEBAUERS) aerosol 1 application, 1 application, Topical, PRN,  Valentina Gu, NP   perflutren lipid microspheres (DEFINITY) IV suspension, 1-10 mL, Intravenous, PRN, Harrie Jeans C, MD, 2 mL at 04/21/21 1101   sodium chloride flush (NS) 0.9 % injection 3 mL, 3 mL, Intravenous, Q12H, Aslam, Sadia, MD, 3 mL at 04/21/21 1031  Unable to see patient given all of his tests today. Will f/up tomorrow.  Pt seen by Clance Boll, MSN, APN-BC/Nurse Practitioner

## 2021-04-21 NOTE — Consult Note (Addendum)
[place] Nurse Consult Note: Reason for Consult: Consult requested for BLE.  Pt with generalized darker colored skin to feet and legs which are cold to the touch. Wound type:  Left anterior calf with full thickness wound; 4X3X.2cm, yellow dry wound bed. Right anterior calf with scattered dry scabbed areas, no open wounds or drainage.  Right great toe with partial thickness wound; .5X.5X.1cm, small amt pink drainage Dressing procedure/placement/frequency: Topical treatment orders provided for bedside nurses to perform as follows to promote moist healing and provide antimicrobial benefits: Apply Bactroban to left leg wound and right great toe wound Q day, then cover with foam dressing.  (Change foam dressing Q 3 days or PRN soiling.) Please re-consult if further assistance is needed.  Thank-you,  Julien Girt MSN, [place], [place], [place], [place]

## 2021-04-21 NOTE — Progress Notes (Signed)
°  Echocardiogram 2D Echocardiogram with contrast has been performed.  Scott Castillo F 04/21/2021, 11:00 AM

## 2021-04-21 NOTE — Progress Notes (Signed)
Online clearance tes

## 2021-04-21 NOTE — Evaluation (Signed)
Occupational Therapy Evaluation Patient Details Name: Scott Castillo MRN: [id number] DOB: Jun 14, 1960 Today's Date: 04/21/2021   History of Present Illness Scott Castillo is a 61 y.o. male with a pertinent PMH of ESRD on HD MWF, diabetes mellitus with polyneuropathy, hypertension, hyperlipidemia, and C7-8 radiculopathy, venous stasis BLE who presents to [facility] with expressive aphasia and concerns for left sided numbness.  MRI without acute findings. Premature brain atrophy present. MRI is motion degraded   Clinical Impression   Scott Castillo was indep PTA including driving and working at [employer]. He lives in a mobile home with his wife who can assist as needed at d/c. Upon evaluation pt required min A for transfers and functional ambulation with RW for verbal cues and carry over of safety compensatory techniques. As well as up to min A for ADLs. He has mild paraesthesias in the ulnar aspect of his L hand, but other wise is equally [facility] on both sides. He is limited by poor insight to safety, problem solving and impaired balance. He will benefit from OT acutely. Recommend d/c home with Neuro OP OT follow up.      Recommendations for follow up therapy are one component of a multi-disciplinary discharge planning process, led by the attending physician.  Recommendations may be updated based on patient status, additional functional criteria and insurance authorization.   Follow Up Recommendations  Outpatient OT (neuro)    Assistance Recommended at Discharge Intermittent Supervision/Assistance  Patient can return home with the following A little help with walking and/or transfers    Functional Status Assessment  Patient has had a recent decline in their functional status and demonstrates the ability to make significant improvements in function in a reasonable and predictable amount of time.  Equipment Recommendations  Tub/shower seat       Precautions / Restrictions Precautions Precautions:  Fall Restrictions Weight Bearing Restrictions: No      Mobility Bed Mobility Overal bed mobility: Modified Independent                  Transfers Overall transfer level: Needs assistance   Transfers: Sit to/from Stand Sit to Stand: Min assist           General transfer comment: min A for verbal cues. no physical asssit required      Balance Overall balance assessment: Needs assistance   Sitting balance-Leahy Scale: Good     Standing balance support: No upper extremity supported;During functional activity Standing balance-Leahy Scale: Fair       ADL either performed or assessed with clinical judgement   ADL Overall ADL's : Needs assistance/impaired Eating/Feeding: Independent;Sitting   Grooming: Min guard;Standing   Upper Body Bathing: Set up;Sitting   Lower Body Bathing: Min guard;Sit to/from stand   Upper Body Dressing : Set up;Sitting   Lower Body Dressing: Min guard;Sit to/from stand   Toilet Transfer: Minimal Teacher, English as a foreign language;Ambulation;Rolling walker (2 wheels) Toilet Transfer Details (indicate cue type and reason): min A for verbal cues Toileting- Clothing Manipulation and Hygiene: Supervision/safety;Sit to/from stand       Functional mobility during ADLs: Minimal assistance;Rolling walker (2 wheels) General ADL Comments: pt attempted 3x to sit<>stand with pulling from the walker. required vcs each time to push from the bed with better ability to stand.     Vision Baseline Vision/History: 0 No visual deficits Ability to See in Adequate Light: 0 Adequate Vision Assessment?: No apparent visual deficits            Pertinent Vitals/Pain Pain Assessment:  No/denies pain     Hand Dominance Right   Extremity/Trunk Assessment Upper Extremity Assessment Upper Extremity Assessment: LUE deficits/detail LUE Deficits / Details: mildly incoordinated, 5th digit sensation imapirment. ROM and MMT is overall WFL LUE Sensation: decreased  light touch;decreased proprioception LUE Coordination: decreased fine motor   Lower Extremity Assessment Lower Extremity Assessment: Defer to PT evaluation   Cervical / Trunk Assessment Cervical / Trunk Assessment: Normal   Communication Communication Communication: No difficulties   Cognition Arousal/Alertness: Awake/alert Behavior During Therapy: WFL for tasks assessed/performed Overall Cognitive Status: No family/caregiver present to determine baseline cognitive functioning             General Comments: mild word finding problems noted, vcs for safety and problem solving. Limited insight to safety a deficits. Attributing L ulnar aspect sensation loss to beign cold     General Comments  VSS on RA, no falls. admits to furtinure walking, and sometimes tripping at home due to bilat foot numbness            Home Living Family/patient expects to be discharged to:: Private residence Living Arrangements: Spouse/significant other Available Help at Discharge: Family Type of Home: Mobile home Home Access: Stairs to enter Entrance Stairs-Number of Steps: 3 Entrance Stairs-Rails: [place]: One level     Bathroom Shower/Tub: Teacher, early years/pre: Handicapped height     Home Equipment: [place] (4 wheels);Rolling Walker (2 wheels);BSC/3in1;Grab bars - tub/shower   Additional Comments: wife works in Ambulance person at [employer]      Prior Functioning/Environment Prior Level of Function : Independent/Modified Independent             Mobility Comments: fell out of bed x 1 in past six months ADLs Comments: 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; restocks        OT Problem List: Decreased range of motion;Decreased strength;Decreased activity tolerance;Impaired balance (sitting and/or standing);Decreased coordination;Decreased cognition;Decreased safety awareness;Decreased knowledge of precautions       OT Treatment/Interventions: Self-care/ADL training;Therapeutic exercise;Balance training;Patient/family education;Therapeutic activities;DME and/or AE instruction    OT Goals(Current goals can be found in the care plan section) Acute Rehab OT Goals Patient Stated Goal: home OT Goal Formulation: With patient Time For Goal Achievement: 05/05/21 Potential to Achieve Goals: Good ADL Goals Pt Will Perform Grooming: with modified independence;standing Pt Will Perform Lower Body Dressing: with modified independence;sit to/from stand Pt Will Transfer to Toilet: with modified independence;ambulating Pt Will Perform Tub/Shower Transfer: with modified independence;ambulating Pt/caregiver will Perform Home Exercise Program: Increased ROM;Increased strength;Left upper extremity;With written HEP provided  OT Frequency: Min 2X/week       AM-PAC OT "6 Clicks" Daily Activity     Outcome Measure Help from another person eating meals?: None Help from another person taking care of personal grooming?: A Little Help from another person toileting, which includes using toliet, bedpan, or urinal?: A Little Help from another person bathing (including washing, rinsing, drying)?: A Little Help from another person to put on and taking off regular upper body clothing?: None Help from another person to put on and taking off regular lower body clothing?: A Little 6 Click Score: 20   End of Session Equipment Utilized During Treatment: Gait belt;Rolling walker (2 wheels) Nurse Communication: Mobility status  Activity Tolerance: Patient tolerated treatment well Patient left: in chair;with call bell/phone within reach  OT Visit Diagnosis: Unsteadiness on feet (R26.81);Other abnormalities of gait and mobility (R26.89);Hemiplegia and hemiparesis Hemiplegia -  Right/Left: Left Hemiplegia - caused by: Unspecified                Time: 2831-5176 OT Time Calculation (min): 22 min Charges:  OT General Charges $OT  Visit: 1 Visit OT Evaluation $OT Eval Moderate Complexity: 1 Mod   Scott Castillo A Scott Castillo 04/21/2021, 9:18 AM

## 2021-04-21 NOTE — Progress Notes (Signed)
PT Cancellation Note  Patient Details Name: Scott Castillo MRN: [id number] DOB: 02-03-1961   Cancelled Treatment:    Reason Eval/Treat Not Completed: (P) Patient at procedure or test/unavailable (pt at HD dept) Will continue efforts per PT plan of care as schedule permits.   Kara Pacer Antoneo Ghrist 04/21/2021, 12:51 PM

## 2021-04-21 NOTE — Progress Notes (Addendum)
HD#1 SUBJECTIVE:  Patient Summary: Scott Castillo is a 61 y.o. 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   Overnight Events: No acute events overnight  Interim History: This is hospital day 1 for Scott Castillo who was seen and evaluated at the bedside. He states that he is feeling much better today. He feels that his speech is back to his baseline. The patient does still endorse numbness in his left hand, but he also notes this has been ongoing since he pinched a nerve in his neck 1 year ago.   OBJECTIVE:  Vital Signs: Vitals:   04/20/21 2015 04/20/21 2100 04/21/21 0021 04/21/21 0457  BP: (!) 166/96 (!) 193/116 (!) 168/93 (!) 188/104  Pulse: 86 82 81 85  Resp: 16 18 20 20   Temp: 98.2 F (36.8 C) 98 F (36.7 C) 98.6 F (37 C) 98 F (36.7 C)  TempSrc: Oral Oral Oral Oral  SpO2: 95% 92% 97% 96%  Weight:      Height:       Supplemental O2: Room Air SpO2: 96 %  Filed Weights   04/20/21 0700  Weight: 117 kg    No intake or output data in the 24 hours ending 04/21/21 3976 Net IO Since Admission: No IO data has been entered for this period [04/21/21 0613]  Physical Exam: General: Pleasant, well-appearing male laying in bed. No acute distress. CV: RRR. No murmurs, rubs, or gallops. No LE edema Pulmonary: Lungs CTAB. Normal effort. No wheezing or rales. Abdominal: Soft, nontender, nondistended. Normal bowel sounds. Extremities: Some wasting noted in L thenar eminence. Palpable thrill in L forearm.  Skin: Warm and dry. Venous stasis dermatitis in bilateral lower extremities Neuro: A&Ox3. Moves all extremities, strength intact. Normal sensation with numbness/tingling in L hand.  Psych: Normal mood and affect     ASSESSMENT/PLAN:  Assessment:  Plan:  #Expressive aphasia Patient presented with left sided numbness and expressive aphasia/dysphasia.  First attempt at MRI brain was inadequate to rule out infarction, patient is claustrophobic. CTA with atherosclerosis without flow limiting stenosis of Sabatino vessels. A1c increased to 8.5, and LDL 55. PT/OT recommending outpatient therapy.  - TTE - Repeat MRI with ativan support to evaluate for cerebral infarction - Continue aspirin and plavix for now - Check lipid panel, continuing home atorvastatin 40mg  daily - Continue permissive HTN for 24 hrs  #ESRD on HD MWF Patient has not missed any dialysis sessions recently, aside from yesterday, as he presented to the hospital. There was no emergent need for dialysis yesterday. K 5.1 and Na 128 this morning.  - Nephro consulted for dialysis; plan for HD today and will monitor for recrudescence of neurologic symptoms  #Anemia of chronic disease Hb 8.5, with baseline around 8.5-9.0. No signs of active bleeding. - Daily CBC  #Type 2 diabetes A1c 6.4 previously, elevated to 8.5 this admission, although could still be falsely low in the setting of ESRD on HD. On Rybelsus at home. CBGs in the 200s over the last 24 hrs.  - Start semglee 5u daily  - SSI  #Hypertension  #Hyperlipidemia Patient is on irbesartan 150 mg at home for HTN and lipitor 40 mg for HLD. Currently holding antihypertensives in the setting of permissive hypertension. - Continue lipitor 40 mg daily   #Chronic venous stasis dermatitis No active signs of infection noted.  - Wound care consult  Best Practice:  Diet: Renal diet IVF: Fluids: none VTE: heparin injection 5,000 Units Start: 04/20/21 1400 Code: Full AB: None Therapy Recs:  Outpatient PT , DME: none DISPO: Anticipated discharge  in 1-2 days  to Home pending Medical stability.  Signature: Buddy Duty, D.O.  Internal Medicine Resident, PGY-1 [facility]  Pager: [phone] 6:13 AM, 04/21/2021   Please contact the on call pager after 5 pm and on weekends at [phone].

## 2021-04-22 ENCOUNTER — Other Ambulatory Visit (HOSPITAL_COMMUNITY): Payer: Self-pay

## 2021-04-22 DIAGNOSIS — R4701 Aphasia: Secondary | ICD-10-CM | POA: Diagnosis not present

## 2021-04-22 DIAGNOSIS — F801 Expressive language disorder: Secondary | ICD-10-CM | POA: Diagnosis not present

## 2021-04-22 LAB — GLUCOSE, CAPILLARY
Glucose-Capillary: 144 mg/dL — ABNORMAL HIGH (ref 70–99)
Glucose-Capillary: 146 mg/dL — ABNORMAL HIGH (ref 70–99)

## 2021-04-22 LAB — CBC
HCT: 27.5 % — ABNORMAL LOW (ref 39.0–52.0)
Hemoglobin: 8.9 g/dL — ABNORMAL LOW (ref 13.0–17.0)
MCH: 28.7 pg (ref 26.0–34.0)
MCHC: 32.4 g/dL (ref 30.0–36.0)
MCV: 88.7 fL (ref 80.0–100.0)
Platelets: 222 10*3/uL (ref 150–400)
RBC: 3.1 MIL/uL — ABNORMAL LOW (ref 4.22–5.81)
RDW: 16.6 % — ABNORMAL HIGH (ref 11.5–15.5)
WBC: 7.1 10*3/uL (ref 4.0–10.5)
nRBC: 0 % (ref 0.0–0.2)

## 2021-04-22 LAB — RENAL FUNCTION PANEL
Albumin: 2.8 g/dL — ABNORMAL LOW (ref 3.5–5.0)
Anion gap: 11 (ref 5–15)
BUN: 57 mg/dL — ABNORMAL HIGH (ref 6–20)
CO2: 25 mmol/L (ref 22–32)
Calcium: 9 mg/dL (ref 8.9–10.3)
Chloride: 94 mmol/L — ABNORMAL LOW (ref 98–111)
Creatinine, Ser: 9.2 mg/dL — ABNORMAL HIGH (ref 0.61–1.24)
GFR, Estimated: 6 mL/min — ABNORMAL LOW (ref >60)
Glucose, Bld: 161 mg/dL — ABNORMAL HIGH (ref 70–99)
Phosphorus: 7 mg/dL — ABNORMAL HIGH (ref 2.5–4.6)
Potassium: 4.5 mmol/L (ref 3.5–5.1)
Sodium: 130 mmol/L — ABNORMAL LOW (ref 135–145)

## 2021-04-22 LAB — HEPATITIS B SURFACE ANTIBODY, QUANTITATIVE: Hep B S AB Quant (Post): 487.3 m[IU]/mL (ref >9.9)

## 2021-04-22 MED ORDER — IRBESARTAN 150 MG PO TABS
150.0000 mg | ORAL_TABLET | Freq: Every day | ORAL | Status: DC
  Administered 2021-04-22: 150 mg via ORAL
  Filled 2021-04-22: qty 1

## 2021-04-22 MED ORDER — CARVEDILOL 12.5 MG PO TABS: 25.0000 mg | ORAL_TABLET | Freq: Two times a day (BID) | ORAL | Status: DC

## 2021-04-22 MED ORDER — CARVEDILOL 25 MG PO TABS
25.0000 mg | ORAL_TABLET | Freq: Two times a day (BID) | ORAL | 0 refills | Status: AC
  Filled 2021-04-22: qty 60, 30d supply, fill #0

## 2021-04-22 MED ORDER — ASPIRIN 81 MG PO CHEW
81.0000 mg | CHEWABLE_TABLET | Freq: Every day | ORAL | 0 refills | Status: AC
  Filled 2021-04-22: qty 30, 30d supply, fill #0

## 2021-04-22 MED ORDER — ATORVASTATIN CALCIUM 40 MG PO TABS
40.0000 mg | ORAL_TABLET | Freq: Every day | ORAL | 0 refills | Status: AC
  Filled 2021-04-22: qty 30, 30d supply, fill #0

## 2021-04-22 NOTE — TOC Transition Note (Signed)
Transition of Care [facility]) - CM/SW Discharge Note   Patient Details  Name: Scott Castillo MRN: [id number] Date of Birth: 16-May-1960  Transition of Care [facility]) CM/SW Contact:  Pollie Friar, RN Phone Number: 04/22/2021, 10:42 AM   Clinical Narrative:    Patient discharging home with outpatient therapy through [facility] outpatient therapy. Orders faxed and CM confirmed the receipt.  Pt has transportation home.    Final next level of care: OP Rehab Barriers to Discharge: No Barriers Identified   Patient Goals and CMS Choice     Choice offered to / list presented to : Patient  Discharge Placement                       Discharge Plan and Services                                     Social Determinants of Health (SDOH) Interventions     Readmission Risk Interventions Readmission Risk Prevention Plan 06/19/2020  Transportation Screening Complete  PCP or Specialist Appt within 3-5 Days Complete  HRI or [place] Complete  Social Work Consult for [place] Planning/Counseling Complete  Palliative Care Screening Complete  Medication Review [employer] photographer) Referral to Pharmacy  Some recent data might be hidden

## 2021-04-22 NOTE — Discharge Instructions (Addendum)
Dear Scott Castillo,  You were hospitalized for your difficulties with speech, which resolved while you were in the hospital. We confirmed that you did not have a stroke based on the CT scan and MRI that you had done. However, we recommend that you take aspirin 81 mg daily and atorvastatin 40 mg daily to prevent any future strokes.   While you were hospitalized, we also did an ultrasound of your heart which showed that it is not squeezing as well as it used to. We recommend you continue to take your carvedilol 25 mg twice a day and you should follow up with a cardiologist to evaluate this further and to optimize your medications. We have referred you to a cardiologist and they should call you to set up an appt.   Please follow up with your primary care physician Dr. Garlon Hatchet, this month for a hospital follow up.

## 2021-04-22 NOTE — Progress Notes (Addendum)
Pt receives out-pt HD at [facility] on MWF. Pt arrives at 9:15 for 9:45 chair time. Chart reviewed which states possible d/c today or tomorrow. Contacted attending via secure chat this am to inquire if pt to be d/c today could pt be d/c this am in order to receive out-pt HD at clinic. Pt would need to arrive at clinic by 12:00. Awaiting a response regarding plan.Will assist as needed.  Melven Sartorius Renal Navigator [phone]   Addendum at 10:33 am: Plan is for pt to d/c this am in order to arrive at [facility] by 12:00. Spoke to pt via phone who is aware of plans and speaking to wife via phone regarding transportation at d/c. Made pt's RN aware of plan as well. Spoke to [employer], Agricultural consultant, at clinic to make her aware of pt's plan to arrive by 12:00 for treatment today.

## 2021-04-22 NOTE — Discharge Summary (Signed)
Name: Scott Castillo MRN: [id number] DOB: 23-Dec-1960 61 y.o. PCP: Algis Greenhouse, MD  Date of Admission: 04/20/2021  7:43 AM Date of Discharge:  04/22/2021 Attending Physician: Dr. Evette Doffing  DISCHARGE DIAGNOSIS:  Primary Problem: Expressive aphasia   Hospital Problems: Principal Problem:   Expressive aphasia Active Problems:   Type 2 diabetes mellitus with ESRD (end-stage renal disease) ([place])   ESRD (end stage renal disease) ([place])   Expressive aphasia, resolved ESRD on HD Anemia of chronic disease Type 2 diabetes C7-8 radiculopathy Hypertension Hyperlipidemia Chronic venous stasis dermatitis  DISCHARGE MEDICATIONS:   Allergies as of 04/22/2021       Reactions   Heparin Other (See Comments)   Per pt he has episodes where he can't speak. He has tolerated this med multiple times since this entered so unlikely it is true allergy   Pioglitazone Swelling   Site of swelling not recalled by patient   Adhesive [tape] Other (See Comments)   Causes redness, pt prefers paper tape    Latex Rash   Redness, also        Medication List     TAKE these medications    ascorbic acid 1000 MG tablet Commonly known as: VITAMIN C Take 1,000 mg by mouth daily.   aspirin 81 MG chewable tablet Chew 1 tablet (81 mg total) by mouth daily.   atorvastatin 40 MG tablet Commonly known as: LIPITOR Take 1 tablet (40 mg total) by mouth daily.   carvedilol 25 MG tablet Commonly known as: COREG Take 1 tablet (25 mg total) by mouth 2 (two) times daily with a meal.   co-enzyme Q-10 30 MG capsule Take 30 mg by mouth 2 (two) times daily.   cyanocobalamin 1000 MCG tablet Take 1,000 mcg by mouth daily.   ferric citrate 1 GM 210 MG(Fe) tablet Commonly known as: AURYXIA Take 210-420 mg by mouth See admin instructions. Take 2 tablets (420 mg) by mouth 3 times daily with meals and 1 tablet (210 mg) with snacks   irbesartan 150 MG tablet Commonly known as: AVAPRO Take 150 mg by mouth  daily.   loperamide 2 MG capsule Commonly known as: IMODIUM Take 1 capsule (2 mg total) by mouth every 6 (six) hours as needed for diarrhea or loose stools.   Rybelsus 14 MG Tabs Generic drug: Semaglutide Take 14 mg by mouth daily.   torsemide 100 MG tablet Commonly known as: DEMADEX Take 100 mg by mouth See admin instructions. Takes on Tuesday,Thursday,Saturday and Sunday(non-dialysis days)               Discharge Care Instructions  (From admission, onward)           Start     Ordered   04/22/21 0000  Discharge wound care:       Comments: Apply Bactroban to left leg wound and right great toe wound Q day, then cover with foam dressing.  (Change foam dressing every 3 days or as needed with soiling   04/22/21 1004            DISPOSITION AND FOLLOW-UP:  ScottAristidis E Castillo was discharged from [facility] in Stable condition. At the hospital follow up visit please address:  Expressive aphasia: reassess that symptoms are back to baseline and that patient is taking aspirin 81 mg and lipitor 40 mg  Heart failure: Ambulatory referral to cardiology placed as patient would benefit from ischemic workup and medication optimization. 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   Why: The outpatient therapy will contact you for the first appointment Contact information: [place] [address] [phone]         Algis Greenhouse, MD. Schedule an appointment as soon as possible for a visit in 1 week(s).   Specialty: Family Medicine Why: Please make a follow-up appointment with your primary care  within the next week or so. Contact information: [address] [place] [address] [phone]                 HOSPITAL COURSE:  Patient Summary: #Expressive aphasia  Patient presented with acute onset expressive aphasia and concerns for left-sided numbness.  Of note, patient has had prior similar episodes of expressive aphasia but this was noted mostly following dialysis that was thought to be secondary to dialysis disequilibrium syndrome.  However, patient's current episode was prior to dialysis this morning. 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  Initial CT head negative for acute intracranial abnormality and CTA head and neck negative for large vessel occlusion.  Initial MRI brain obtained that was negative for acute infarct but also noted to be motion degraded; did note premature brain atrophy. Repeat MRI on 1/3 confirmed no acute infarction. Will discharge with aspirin 81 mg daily and atorvastatin 40 mg daily. No need for DAPT as acute infarction was ruled out. PT/OT recommending outpatient therapy, which was arranged for the patient.  #ESRD on HD MWF #Anion gap metabolic acidosis  Patient has a history of ESRD on dialysis Monday Wednesday Friday.  Does not have any recent missed HD sessions.  Labs with stable electrolytes on admission. He did have an anion gap acidosis; however, bicarb was 21.  No signs of infection. No emergent need for HD initially and he received dialysis off schedule, on Tuesday 1/3. Will resume normal HD schedule on discharge.  #Combined chronic systolic and diastolic heart failure Echo this admission with reduced EF of 35-40% (reduced from 45-50% in May), grade III diastolic dysfunction (previously grade I), LA dilatation, new RA dilatation, and new elevated pulmonary artery systolic pressure. Patient has not had an  ischemic evaluation/workup by cardiology in the past. Will refer to outpatient cardiology regarding this workup and for optimization of heart failure medications. Will continue home irbesartan and carvedilol on discharge.   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 He has not had imaging to confirm this, but has reportedly followed with neurology in the past. Would recommend CT imaging of cervical spine to confirm diagnosis, and possible referral back to neurology vs neurosurgery, as numbness/tingling is very distressing to the patient.  #Anemia of chronic disease  Hemoglobin stable around baseline. Did not require any transfusions.   #Type II diabetes mellitus  Patient has a history of diabetes for which he is on Rybelsus daily.  Prior A1c of 6.4 8 months ago, elevated at 8.5 this admission, although could still be falsely low in the setting of ESRD on HD. Recommend close follow up with PCP for medication optimization.   #Hypertension #Hyperlipidemia  Patient has a history of hypertension for which she is on irbesartan 150 mg daily.  He is also on Lipitor 40 mg daily for hyperlipidemia.  Held p.o. antihypertensives in setting of possible stroke and permissive hypertension; resumed high intensity statin. Discharged back on home irbesartan and carvedilol, as well as statin.   #Chronic venous stasis dermatitis Patient with bilateral lower extremity chronic venous stasis.  Does have some superficial wounds that appear to be healing well.  No signs of infection or cellulitis at this time. Wound care consulted during hospitalization.    DISCHARGE INSTRUCTIONS:   Discharge Instructions     (HEART FAILURE PATIENTS) Call MD:  Anytime you have any of the following symptoms: 1) 3 pound weight gain in 24 hours or 5 pounds in 1 week 2) shortness of breath, with or without a dry hacking cough 3) swelling in the hands, feet or stomach 4) if you have to  sleep on extra pillows at night in order to breathe.   Complete by: As directed    Ambulatory referral to Cardiology   Complete by: As directed    Preferably cardiology in [place]; reduced EF and has not had ischemic evaluation/work up   Ambulatory referral to Occupational Therapy   Complete by: As directed    Ambulatory referral to Physical Therapy   Complete by: As directed    Call MD for:  difficulty breathing, headache or visual disturbances   Complete by: As directed    Diet Carb Modified   Complete by: As directed    Discharge wound care:   Complete by: As directed    Apply Bactroban to left leg wound and right great toe wound Q day, then cover with foam dressing.  (Change foam dressing every 3 days or as needed with soiling   Increase activity slowly   Complete by: As directed        SUBJECTIVE:  Jordynn E Luellen was seen and evaluated on the day of discharge. He feels that his speech is back to his baseline. He also notes that his left hand numbness and tingling is still present, although it feels improved compared to the day prior. No new acute complaints today.   Discharge Vitals:   BP (!) 167/91 (BP Location: Right Arm)    Pulse 87    Temp 98 F (36.7 C) (Oral)    Resp 19    Ht 6' (1.829 m)    Wt 109.3 kg    SpO2 95%    BMI 32.68 kg/m   OBJECTIVE:  General: Pleasant, well-appearing male laying in bed. No acute distress. CV: RRR. No murmurs, rubs, or gallops. 1+ LE edema Pulmonary: Lungs CTAB. Normal effort. No wheezing or rales. Abdominal: Soft, nontender, nondistended.  Extremities: Palpable thrill in L forearm. L thenar eminence wasting Skin: Warm and dry. No obvious rash or lesions. Neuro: A&Ox3. Moves all extremities with upper and lower extremity strength intact bilaterally. Normal sensation with numbness/tingling in L hand, improved. Psych: Normal mood and affect    Pertinent Labs, Studies, and Procedures:  CBC Latest Ref Rng & Units 04/22/2021 04/21/2021  04/20/2021  WBC 4.0 - 10.5 K/uL 7.1 9.7 -  Hemoglobin 13.0 - 17.0 g/dL 8.9(L) 8.5(L) 9.2(L)  Hematocrit 39.0 - 52.0 % 27.5(L) 26.4(L) 27.0(L)  Platelets 150 - 400 K/uL 222 229 -    CMP Latest Ref Rng & Units 04/22/2021 04/21/2021 04/20/2021  Glucose 70 - 99 mg/dL 161(H) 207(H) 322(H)  BUN 6 - 20 mg/dL 57(H) 90(H)  87(H)  Creatinine 0.61 - 1.24 mg/dL 9.20(H) 12.06(H) 12.00(H)  Sodium 135 - 145 mmol/L 130(L) 128(L) 127(L)  Potassium 3.5 - 5.1 mmol/L 4.5 5.1 4.8  Chloride 98 - 111 mmol/L 94(L) 90(L) 94(L)  CO2 22 - 32 mmol/L 25 21(L) -  Calcium 8.9 - 10.3 mg/dL 9.0 9.4 -  Total Protein 6.5 - 8.1 g/dL - - -  Total Bilirubin 0.3 - 1.2 mg/dL - - -  Alkaline Phos 38 - 126 U/L - - -  AST 15 - 41 U/L - - -  ALT 0 - 44 U/L - - -     Signed: Buddy Duty, D.O.  Internal Medicine Resident, PGY-1 [facility]  Pager: [phone] 10:12 AM, 04/22/2021

## 2021-04-23 ENCOUNTER — Telehealth: Payer: Self-pay | Admitting: Nephrology

## 2021-04-23 NOTE — Telephone Encounter (Signed)
Transition of Care Contact from [place]  Date of Discharge: 04/22/21 Date of Contact: 04/23/21 Method of contact: phone - attempted  Attempted to contact patient to discuss transition of care from inpatient admission.  Patient did not answer the phone.  Unable to leave message due to mailbox being full.  Will follow up at dialysis.  Jen Mow, PA-C [facility] Pager: [phone]

## 2021-12-02 IMAGING — DX DG CHEST 1V PORT
1 series · 1 of 1 positions shown · non-contrast
Comparison: 09/03/2020.

CLINICAL DATA: Respiratory failure.  Hypoxia.

EXAM:
PORTABLE CHEST 1 VIEW

[chest]
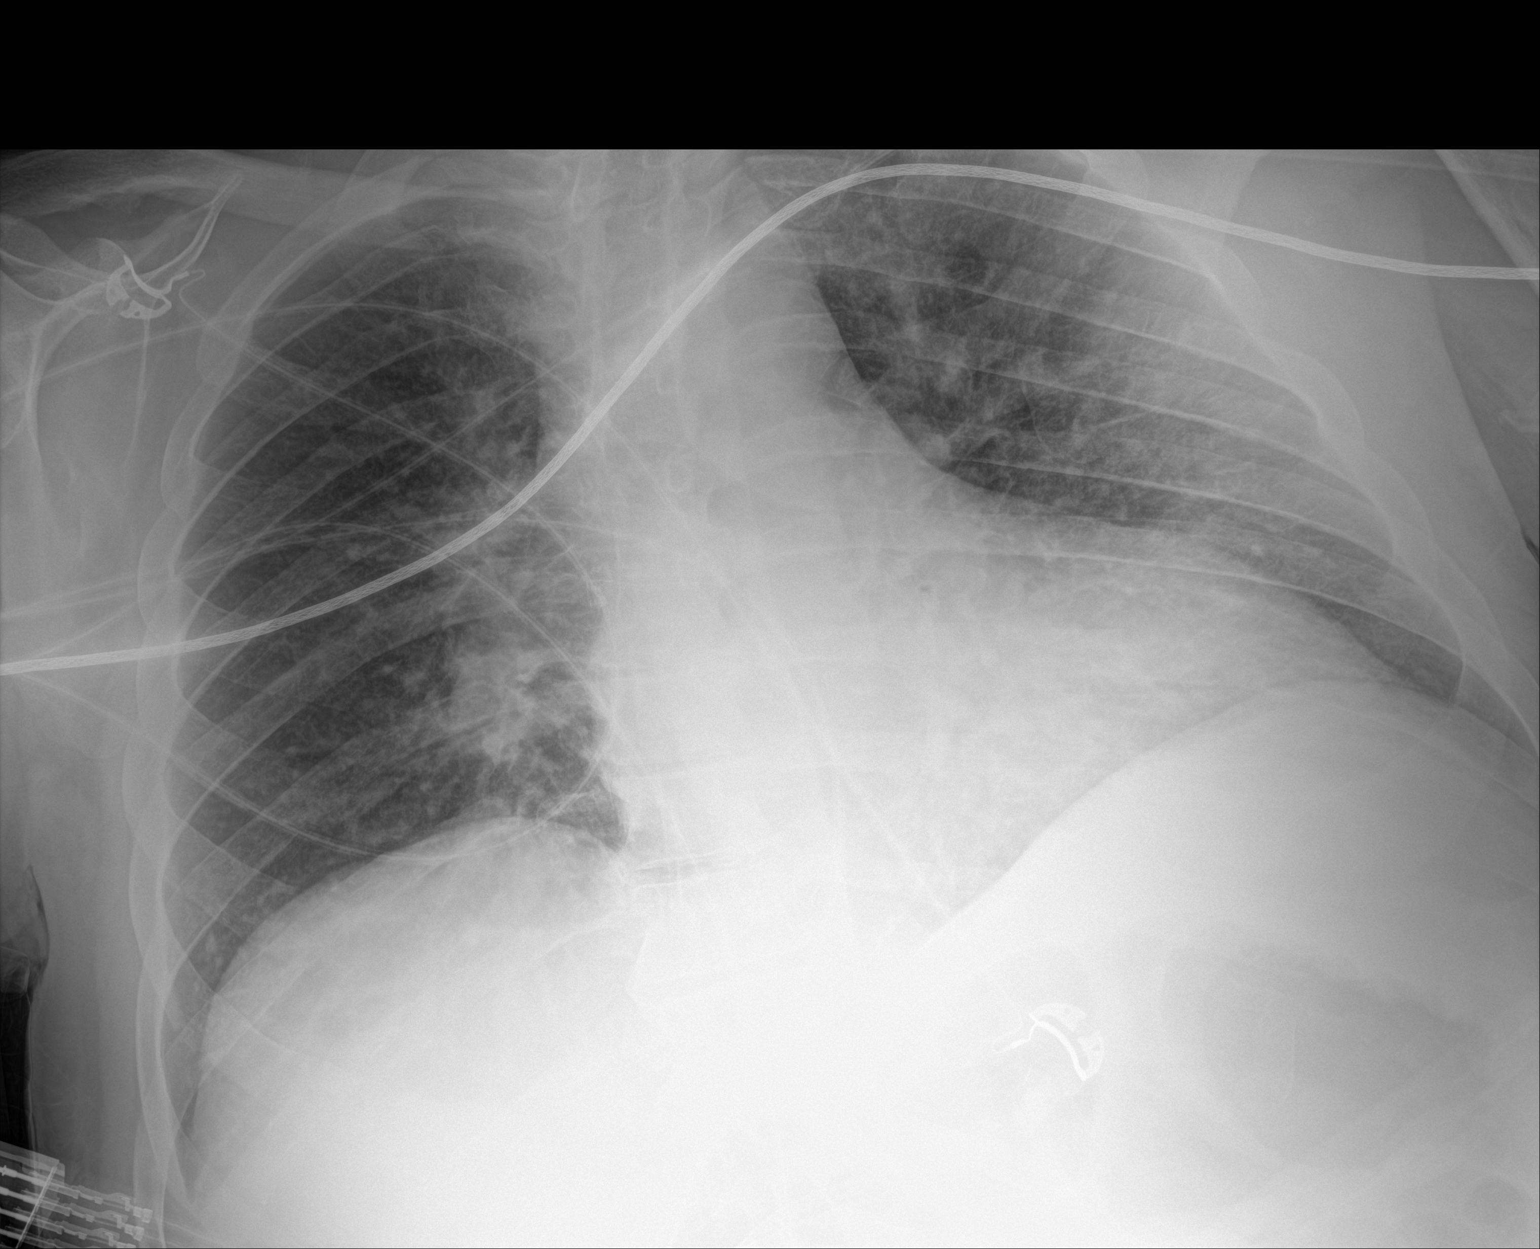

[1 of 1 positions shown; findings below may reference images not displayed]

FINDINGS: Mediastinum and hilar structures normal. Cardiomegaly and pulmonary
venous congestion again noted. Low lung volumes. Interim partial
clearing of interstitial edema/infiltrates. No pleural effusion or
pneumothorax. Left costophrenic angle incompletely imaged.
IMPRESSION: Cardiomegaly and pulmonary venous congestion again noted. Low lung
volumes. Interim partial clearing of interstitial edema/infiltrates.

## 2022-01-06 ENCOUNTER — Telehealth: Payer: Self-pay | Admitting: *Deleted

## 2022-01-06 NOTE — Patient Outreach (Signed)
  Care Coordination   01/06/2022 Name: Scott Castillo MRN: [id number] DOB: May 11, 1960   Care Coordination Outreach Attempts:  An unsuccessful telephone outreach was attempted today to offer the patient information about available care coordination services as a benefit of their health plan.   Follow Up Plan:  Additional outreach attempts will be made to offer the patient care coordination information and services.   Encounter Outcome:  No Answer  Care Coordination Interventions Activated:  No   Care Coordination Interventions:  No, not indicated    Jacqlyn Larsen [facility], [place] RN Care Coordinator [phone]

## 2022-01-07 ENCOUNTER — Telehealth: Payer: Self-pay | Admitting: *Deleted

## 2022-01-07 NOTE — Patient Outreach (Signed)
  Care Coordination   01/07/2022 Name: Scott Castillo MRN: [id number] DOB: 02-10-1961   Care Coordination Outreach Attempts:  A second unsuccessful outreach was attempted today to offer the patient with information about available care coordination services as a benefit of their health plan.     Follow Up Plan:  Additional outreach attempts will be made to offer the patient care coordination information and services.   Encounter Outcome:  No Answer  Care Coordination Interventions Activated:  No   Care Coordination Interventions:  No, not indicated    Jacqlyn Larsen [facility], [place] RN Care Coordinator [phone]

## 2022-01-08 ENCOUNTER — Telehealth: Payer: Self-pay | Admitting: *Deleted

## 2022-01-08 NOTE — Patient Outreach (Signed)
  Care Coordination   01/08/2022 Name: Scott Castillo MRN: [id number] DOB: February 18, 1961   Care Coordination Outreach Attempts:  A third unsuccessful outreach was attempted today to offer the patient with information about available care coordination services as a benefit of their health plan.   Follow Up Plan:  No further outreach attempts will be made at this time. We have been unable to contact the patient to offer or enroll patient in care coordination services  Encounter Outcome:  No Answer  Care Coordination Interventions Activated:  No   Care Coordination Interventions:  No, not indicated    Jacqlyn Larsen [facility], BSN [facility] RN Care Coordinator [phone]

## 2022-06-08 ENCOUNTER — Encounter (HOSPITAL_COMMUNITY): Payer: Self-pay

## 2022-06-08 ENCOUNTER — Encounter (HOSPITAL_COMMUNITY): Payer: Self-pay | Admitting: Internal Medicine

## 2022-06-08 ENCOUNTER — Other Ambulatory Visit: Payer: Self-pay

## 2022-06-08 ENCOUNTER — Inpatient Hospital Stay (HOSPITAL_COMMUNITY)
Admission: EM | Admit: 2022-06-08 | Discharge: 2022-06-15 | DRG: 617 | Disposition: A | Discharge status: Rehabilitation Facility | Payer: Medicare Other | Source: Other Acute Inpatient Hospital | Attending: Internal Medicine | Admitting: Internal Medicine

## 2022-06-08 DIAGNOSIS — Z91040 Latex allergy status: Secondary | ICD-10-CM

## 2022-06-08 DIAGNOSIS — E1165 Type 2 diabetes mellitus with hyperglycemia: Secondary | ICD-10-CM | POA: Diagnosis present

## 2022-06-08 DIAGNOSIS — N185 Chronic kidney disease, stage 5: Secondary | ICD-10-CM | POA: Diagnosis not present

## 2022-06-08 DIAGNOSIS — E871 Hypo-osmolality and hyponatremia: Secondary | ICD-10-CM | POA: Diagnosis present

## 2022-06-08 DIAGNOSIS — E669 Obesity, unspecified: Secondary | ICD-10-CM | POA: Diagnosis not present

## 2022-06-08 DIAGNOSIS — N189 Chronic kidney disease, unspecified: Secondary | ICD-10-CM | POA: Diagnosis not present

## 2022-06-08 DIAGNOSIS — Z961 Presence of intraocular lens: Secondary | ICD-10-CM | POA: Diagnosis present

## 2022-06-08 DIAGNOSIS — I10 Essential (primary) hypertension: Secondary | ICD-10-CM | POA: Diagnosis present

## 2022-06-08 DIAGNOSIS — E785 Hyperlipidemia, unspecified: Secondary | ICD-10-CM | POA: Diagnosis present

## 2022-06-08 DIAGNOSIS — R34 Anuria and oliguria: Secondary | ICD-10-CM | POA: Diagnosis not present

## 2022-06-08 DIAGNOSIS — L89151 Pressure ulcer of sacral region, stage 1: Secondary | ICD-10-CM | POA: Diagnosis not present

## 2022-06-08 DIAGNOSIS — M86172 Other acute osteomyelitis, left ankle and foot: Secondary | ICD-10-CM | POA: Diagnosis present

## 2022-06-08 DIAGNOSIS — M25551 Pain in right hip: Secondary | ICD-10-CM | POA: Diagnosis present

## 2022-06-08 DIAGNOSIS — Y712 Prosthetic and other implants, materials and accessory cardiovascular devices associated with adverse incidents: Secondary | ICD-10-CM | POA: Diagnosis not present

## 2022-06-08 DIAGNOSIS — Z8249 Family history of ischemic heart disease and other diseases of the circulatory system: Secondary | ICD-10-CM

## 2022-06-08 DIAGNOSIS — L02612 Cutaneous abscess of left foot: Secondary | ICD-10-CM | POA: Diagnosis not present

## 2022-06-08 DIAGNOSIS — E8809 Other disorders of plasma-protein metabolism, not elsewhere classified: Secondary | ICD-10-CM | POA: Diagnosis present

## 2022-06-08 DIAGNOSIS — M8468XA Pathological fracture in other disease, other site, initial encounter for fracture: Secondary | ICD-10-CM | POA: Diagnosis present

## 2022-06-08 DIAGNOSIS — E11610 Type 2 diabetes mellitus with diabetic neuropathic arthropathy: Secondary | ICD-10-CM | POA: Diagnosis present

## 2022-06-08 DIAGNOSIS — Z79899 Other long term (current) drug therapy: Secondary | ICD-10-CM

## 2022-06-08 DIAGNOSIS — M869 Osteomyelitis, unspecified: Secondary | ICD-10-CM | POA: Diagnosis present

## 2022-06-08 DIAGNOSIS — Z9842 Cataract extraction status, left eye: Secondary | ICD-10-CM

## 2022-06-08 DIAGNOSIS — L89152 Pressure ulcer of sacral region, stage 2: Secondary | ICD-10-CM | POA: Diagnosis present

## 2022-06-08 DIAGNOSIS — D631 Anemia in chronic kidney disease: Secondary | ICD-10-CM | POA: Diagnosis present

## 2022-06-08 DIAGNOSIS — R2689 Other abnormalities of gait and mobility: Secondary | ICD-10-CM | POA: Diagnosis present

## 2022-06-08 DIAGNOSIS — Z992 Dependence on renal dialysis: Secondary | ICD-10-CM | POA: Diagnosis not present

## 2022-06-08 DIAGNOSIS — Z89512 Acquired absence of left leg below knee: Secondary | ICD-10-CM | POA: Diagnosis present

## 2022-06-08 DIAGNOSIS — E1169 Type 2 diabetes mellitus with other specified complication: Principal | ICD-10-CM | POA: Diagnosis present

## 2022-06-08 DIAGNOSIS — L089 Local infection of the skin and subcutaneous tissue, unspecified: Secondary | ICD-10-CM | POA: Diagnosis present

## 2022-06-08 DIAGNOSIS — D72829 Elevated white blood cell count, unspecified: Secondary | ICD-10-CM | POA: Diagnosis not present

## 2022-06-08 DIAGNOSIS — L02416 Cutaneous abscess of left lower limb: Secondary | ICD-10-CM

## 2022-06-08 DIAGNOSIS — L89620 Pressure ulcer of left heel, unstageable: Secondary | ICD-10-CM | POA: Diagnosis present

## 2022-06-08 DIAGNOSIS — E1122 Type 2 diabetes mellitus with diabetic chronic kidney disease: Secondary | ICD-10-CM | POA: Diagnosis present

## 2022-06-08 DIAGNOSIS — Z7984 Long term (current) use of oral hypoglycemic drugs: Secondary | ICD-10-CM

## 2022-06-08 DIAGNOSIS — M86272 Subacute osteomyelitis, left ankle and foot: Secondary | ICD-10-CM | POA: Diagnosis not present

## 2022-06-08 DIAGNOSIS — Z8507 Personal history of malignant neoplasm of pancreas: Secondary | ICD-10-CM

## 2022-06-08 DIAGNOSIS — R195 Other fecal abnormalities: Secondary | ICD-10-CM | POA: Diagnosis not present

## 2022-06-08 DIAGNOSIS — T82898A Other specified complication of vascular prosthetic devices, implants and grafts, initial encounter: Secondary | ICD-10-CM | POA: Diagnosis not present

## 2022-06-08 DIAGNOSIS — L97509 Non-pressure chronic ulcer of other part of unspecified foot with unspecified severity: Secondary | ICD-10-CM | POA: Diagnosis present

## 2022-06-08 DIAGNOSIS — E1149 Type 2 diabetes mellitus with other diabetic neurological complication: Secondary | ICD-10-CM | POA: Diagnosis present

## 2022-06-08 DIAGNOSIS — Z1152 Encounter for screening for COVID-19: Secondary | ICD-10-CM | POA: Diagnosis not present

## 2022-06-08 DIAGNOSIS — E1152 Type 2 diabetes mellitus with diabetic peripheral angiopathy with gangrene: Secondary | ICD-10-CM | POA: Diagnosis present

## 2022-06-08 DIAGNOSIS — E1141 Type 2 diabetes mellitus with diabetic mononeuropathy: Secondary | ICD-10-CM | POA: Diagnosis present

## 2022-06-08 DIAGNOSIS — G546 Phantom limb syndrome with pain: Secondary | ICD-10-CM | POA: Diagnosis present

## 2022-06-08 DIAGNOSIS — E11319 Type 2 diabetes mellitus with unspecified diabetic retinopathy without macular edema: Secondary | ICD-10-CM | POA: Diagnosis present

## 2022-06-08 DIAGNOSIS — I70262 Atherosclerosis of native arteries of extremities with gangrene, left leg: Secondary | ICD-10-CM | POA: Diagnosis present

## 2022-06-08 DIAGNOSIS — Z794 Long term (current) use of insulin: Secondary | ICD-10-CM | POA: Diagnosis not present

## 2022-06-08 DIAGNOSIS — N186 End stage renal disease: Secondary | ICD-10-CM | POA: Diagnosis present

## 2022-06-08 DIAGNOSIS — E11628 Type 2 diabetes mellitus with other skin complications: Secondary | ICD-10-CM | POA: Diagnosis present

## 2022-06-08 DIAGNOSIS — M79651 Pain in right thigh: Secondary | ICD-10-CM | POA: Diagnosis not present

## 2022-06-08 DIAGNOSIS — I120 Hypertensive chronic kidney disease with stage 5 chronic kidney disease or end stage renal disease: Secondary | ICD-10-CM | POA: Diagnosis present

## 2022-06-08 DIAGNOSIS — R052 Subacute cough: Secondary | ICD-10-CM | POA: Diagnosis not present

## 2022-06-08 DIAGNOSIS — G4700 Insomnia, unspecified: Secondary | ICD-10-CM | POA: Diagnosis not present

## 2022-06-08 DIAGNOSIS — E11621 Type 2 diabetes mellitus with foot ulcer: Secondary | ICD-10-CM | POA: Diagnosis present

## 2022-06-08 DIAGNOSIS — Z9181 History of falling: Secondary | ICD-10-CM

## 2022-06-08 DIAGNOSIS — S88112A Complete traumatic amputation at level between knee and ankle, left lower leg, initial encounter: Secondary | ICD-10-CM | POA: Diagnosis not present

## 2022-06-08 DIAGNOSIS — L8990 Pressure ulcer of unspecified site, unspecified stage: Secondary | ICD-10-CM | POA: Diagnosis present

## 2022-06-08 DIAGNOSIS — Z6835 Body mass index (BMI) 35.0-35.9, adult: Secondary | ICD-10-CM

## 2022-06-08 DIAGNOSIS — L0390 Cellulitis, unspecified: Secondary | ICD-10-CM | POA: Diagnosis not present

## 2022-06-08 DIAGNOSIS — Z888 Allergy status to other drugs, medicaments and biological substances status: Secondary | ICD-10-CM

## 2022-06-08 DIAGNOSIS — E8770 Fluid overload, unspecified: Secondary | ICD-10-CM | POA: Diagnosis not present

## 2022-06-08 DIAGNOSIS — L89616 Pressure-induced deep tissue damage of right heel: Secondary | ICD-10-CM | POA: Diagnosis not present

## 2022-06-08 LAB — HEMOGLOBIN A1C
Hgb A1c MFr Bld: 7.4 % — ABNORMAL HIGH (ref 4.8–5.6)
Mean Plasma Glucose: 165.68 mg/dL

## 2022-06-08 LAB — GLUCOSE, CAPILLARY: Glucose-Capillary: 246 mg/dL — ABNORMAL HIGH (ref 70–99)

## 2022-06-08 MED ORDER — INSULIN ASPART 100 UNIT/ML IJ SOLN
0.0000 [IU] | Freq: Three times a day (TID) | INTRAMUSCULAR | Status: DC
  Administered 2022-06-09: 4 [IU] via SUBCUTANEOUS
  Administered 2022-06-09: 2 [IU] via SUBCUTANEOUS
  Administered 2022-06-10: 4 [IU] via SUBCUTANEOUS
  Administered 2022-06-10: 3 [IU] via SUBCUTANEOUS
  Administered 2022-06-10: 4 [IU] via SUBCUTANEOUS
  Administered 2022-06-11: 1 [IU] via SUBCUTANEOUS
  Administered 2022-06-11 – 2022-06-12 (×2): 3 [IU] via SUBCUTANEOUS
  Administered 2022-06-12: 1 [IU] via SUBCUTANEOUS
  Administered 2022-06-13: 3 [IU] via SUBCUTANEOUS
  Administered 2022-06-13: 4 [IU] via SUBCUTANEOUS
  Administered 2022-06-13: 2 [IU] via SUBCUTANEOUS
  Filled 2022-06-08: qty 1

## 2022-06-08 MED ORDER — INSULIN ASPART 100 UNIT/ML IJ SOLN
0.0000 [IU] | Freq: Every day | INTRAMUSCULAR | Status: DC
  Administered 2022-06-08: 2 [IU] via SUBCUTANEOUS
  Administered 2022-06-09 – 2022-06-12 (×4): 3 [IU] via SUBCUTANEOUS
  Administered 2022-06-14: 2 [IU] via SUBCUTANEOUS

## 2022-06-08 MED ORDER — ORAL CARE MOUTH RINSE: 15.0000 mL | OROMUCOSAL | Status: DC | PRN

## 2022-06-09 ENCOUNTER — Inpatient Hospital Stay (HOSPITAL_COMMUNITY): Payer: Medicare Other

## 2022-06-09 DIAGNOSIS — M25551 Pain in right hip: Secondary | ICD-10-CM | POA: Diagnosis present

## 2022-06-09 DIAGNOSIS — I10 Essential (primary) hypertension: Secondary | ICD-10-CM | POA: Diagnosis not present

## 2022-06-09 DIAGNOSIS — L089 Local infection of the skin and subcutaneous tissue, unspecified: Secondary | ICD-10-CM | POA: Diagnosis not present

## 2022-06-09 DIAGNOSIS — E11628 Type 2 diabetes mellitus with other skin complications: Secondary | ICD-10-CM | POA: Diagnosis not present

## 2022-06-09 DIAGNOSIS — L89151 Pressure ulcer of sacral region, stage 1: Secondary | ICD-10-CM | POA: Diagnosis not present

## 2022-06-09 DIAGNOSIS — N186 End stage renal disease: Secondary | ICD-10-CM

## 2022-06-09 DIAGNOSIS — L0390 Cellulitis, unspecified: Secondary | ICD-10-CM | POA: Diagnosis not present

## 2022-06-09 DIAGNOSIS — E1122 Type 2 diabetes mellitus with diabetic chronic kidney disease: Secondary | ICD-10-CM

## 2022-06-09 DIAGNOSIS — L8990 Pressure ulcer of unspecified site, unspecified stage: Secondary | ICD-10-CM | POA: Diagnosis present

## 2022-06-09 LAB — GLUCOSE, CAPILLARY
Glucose-Capillary: 321 mg/dL — ABNORMAL HIGH (ref 70–99)
Glucose-Capillary: 311 mg/dL — ABNORMAL HIGH (ref 70–99)
Glucose-Capillary: 224 mg/dL — ABNORMAL HIGH (ref 70–99)
Glucose-Capillary: 272 mg/dL — ABNORMAL HIGH (ref 70–99)

## 2022-06-09 LAB — PREALBUMIN: Prealbumin: 5 mg/dL — ABNORMAL LOW (ref 18–38)

## 2022-06-09 LAB — CBC
HCT: 23.8 % — ABNORMAL LOW (ref 39.0–52.0)
Hemoglobin: 7.5 g/dL — ABNORMAL LOW (ref 13.0–17.0)
MCH: 27.4 pg (ref 26.0–34.0)
MCHC: 31.5 g/dL (ref 30.0–36.0)
MCV: 86.9 fL (ref 80.0–100.0)
Platelets: 304 10*3/uL (ref 150–400)
RBC: 2.74 MIL/uL — ABNORMAL LOW (ref 4.22–5.81)
RDW: 17.7 % — ABNORMAL HIGH (ref 11.5–15.5)
WBC: 16.4 10*3/uL — ABNORMAL HIGH (ref 4.0–10.5)
nRBC: 0 % (ref 0.0–0.2)

## 2022-06-09 LAB — BASIC METABOLIC PANEL
Anion gap: 14 (ref 5–15)
BUN: 50 mg/dL — ABNORMAL HIGH (ref 6–20)
CO2: 28 mmol/L (ref 22–32)
Calcium: 9 mg/dL (ref 8.9–10.3)
Chloride: 87 mmol/L — ABNORMAL LOW (ref 98–111)
Creatinine, Ser: 6.95 mg/dL — ABNORMAL HIGH (ref 0.61–1.24)
GFR, Estimated: 8 mL/min — ABNORMAL LOW (ref >60)
Glucose, Bld: 330 mg/dL — ABNORMAL HIGH (ref 70–99)
Potassium: 3.3 mmol/L — ABNORMAL LOW (ref 3.5–5.1)
Sodium: 129 mmol/L — ABNORMAL LOW (ref 135–145)

## 2022-06-09 LAB — SEDIMENTATION RATE: Sed Rate: 92 mm/hr — ABNORMAL HIGH (ref 0–16)

## 2022-06-09 LAB — HEPATITIS B SURFACE ANTIGEN: Hepatitis B Surface Ag: NONREACTIVE

## 2022-06-09 LAB — C-REACTIVE PROTEIN: CRP: 15.3 mg/dL — ABNORMAL HIGH (ref <1.0)

## 2022-06-09 LAB — VAS US ABI WITH/WO TBI

## 2022-06-09 LAB — HIV ANTIBODY (ROUTINE TESTING W REFLEX): HIV Screen 4th Generation wRfx: NONREACTIVE

## 2022-06-09 MED ORDER — PIPERACILLIN-TAZOBACTAM IN DEX 2-0.25 GM/50ML IV SOLN
2.2500 g | Freq: Three times a day (TID) | INTRAVENOUS | Status: DC
  Administered 2022-06-09 – 2022-06-12 (×8): 2.25 g via INTRAVENOUS
  Filled 2022-06-09 (×11): qty 50

## 2022-06-09 MED ORDER — ATORVASTATIN CALCIUM 40 MG PO TABS
40.0000 mg | ORAL_TABLET | Freq: Every day | ORAL | Status: DC
  Administered 2022-06-09 – 2022-06-15 (×7): 40 mg via ORAL
  Filled 2022-06-09 (×7): qty 1

## 2022-06-09 MED ORDER — ACETAMINOPHEN 325 MG PO TABS: 650.0000 mg | ORAL_TABLET | Freq: Four times a day (QID) | ORAL | Status: DC | PRN

## 2022-06-09 MED ORDER — TORSEMIDE 20 MG PO TABS
100.0000 mg | ORAL_TABLET | ORAL | Status: DC
  Administered 2022-06-10 – 2022-06-15 (×3): 100 mg via ORAL
  Filled 2022-06-09 (×4): qty 5

## 2022-06-09 MED ORDER — CALCITRIOL 0.5 MCG PO CAPS
1.2500 ug | ORAL_CAPSULE | ORAL | Status: DC
  Administered 2022-06-09 – 2022-06-14 (×2): 1.25 ug via ORAL
  Filled 2022-06-09 (×2): qty 1

## 2022-06-09 MED ORDER — SODIUM CHLORIDE 0.9 % IV SOLN
2.0000 g | Freq: Once | INTRAVENOUS | Status: AC
  Administered 2022-06-09: 2 g via INTRAVENOUS
  Filled 2022-06-09: qty 20

## 2022-06-09 MED ORDER — CARVEDILOL 25 MG PO TABS
25.0000 mg | ORAL_TABLET | Freq: Two times a day (BID) | ORAL | Status: DC
  Administered 2022-06-09 – 2022-06-15 (×10): 25 mg via ORAL
  Filled 2022-06-09 (×10): qty 1

## 2022-06-09 MED ORDER — ONDANSETRON HCL 4 MG/2ML IJ SOLN: 4.0000 mg | Freq: Four times a day (QID) | INTRAMUSCULAR | Status: DC | PRN

## 2022-06-09 MED ORDER — FLORANEX PO PACK
1.0000 g | PACK | Freq: Three times a day (TID) | ORAL | Status: DC
  Administered 2022-06-09 – 2022-06-15 (×15): 1 g via ORAL
  Filled 2022-06-09 (×22): qty 1

## 2022-06-09 MED ORDER — SUCROFERRIC OXYHYDROXIDE 500 MG PO CHEW
1000.0000 mg | CHEWABLE_TABLET | Freq: Three times a day (TID) | ORAL | Status: DC
  Administered 2022-06-09 – 2022-06-15 (×15): 1000 mg via ORAL
  Filled 2022-06-09 (×15): qty 2

## 2022-06-09 MED ORDER — SODIUM CHLORIDE 0.9 % IV SOLN: 2.0000 g | INTRAVENOUS | Status: DC

## 2022-06-09 MED ORDER — ACETAMINOPHEN 650 MG RE SUPP: 650.0000 mg | Freq: Four times a day (QID) | RECTAL | Status: DC | PRN

## 2022-06-09 MED ORDER — IRBESARTAN 75 MG PO TABS
150.0000 mg | ORAL_TABLET | Freq: Every day | ORAL | Status: DC
  Administered 2022-06-09 – 2022-06-14 (×5): 150 mg via ORAL
  Filled 2022-06-09 (×6): qty 2

## 2022-06-09 MED ORDER — HEPARIN SODIUM (PORCINE) 5000 UNIT/ML IJ SOLN
5000.0000 [IU] | Freq: Three times a day (TID) | INTRAMUSCULAR | Status: DC
  Administered 2022-06-10: 5000 [IU] via SUBCUTANEOUS
  Filled 2022-06-09 (×7): qty 1

## 2022-06-09 MED ORDER — VANCOMYCIN VARIABLE DOSE PER UNSTABLE RENAL FUNCTION (PHARMACIST DOSING): Status: DC

## 2022-06-09 MED ORDER — CHLORHEXIDINE GLUCONATE CLOTH 2 % EX PADS
6.0000 | MEDICATED_PAD | Freq: Every day | CUTANEOUS | Status: DC
  Administered 2022-06-10 – 2022-06-14 (×4): 6 via TOPICAL

## 2022-06-09 MED ORDER — VANCOMYCIN HCL IN DEXTROSE 1-5 GM/200ML-% IV SOLN
1000.0000 mg | INTRAVENOUS | Status: DC
  Administered 2022-06-09: 1000 mg via INTRAVENOUS
  Filled 2022-06-09 (×2): qty 200

## 2022-06-09 MED ORDER — ONDANSETRON HCL 4 MG PO TABS: 4.0000 mg | ORAL_TABLET | Freq: Four times a day (QID) | ORAL | Status: DC | PRN

## 2022-06-09 MED ORDER — METRONIDAZOLE 500 MG PO TABS
500.0000 mg | ORAL_TABLET | Freq: Two times a day (BID) | ORAL | Status: DC
  Administered 2022-06-09 (×2): 500 mg via ORAL
  Filled 2022-06-09 (×2): qty 1

## 2022-06-09 NOTE — Assessment & Plan Note (Signed)
On MWF dialysis. Call nephrology in AM for routine dialysis during hospital stay.

## 2022-06-09 NOTE — Hospital Course (Signed)
PMH of type II DM, HTN, ESRD on HD MWF, neuropathy, nephropathy, retinopathy, nonhealing ulcers on the leg seen by podiatry. Present to the hospital with complaints of worsening wound and drainage ongoing since December 2023.  Was initially sent to current of hospital ER from podiatry office-[facility] pod[facility]. 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  Orthopedic consulted.  Most likely will require surgical management tentatively on Friday.  Nephrology also following for HD needs.

## 2022-06-09 NOTE — Progress Notes (Signed)
Received patient in bed to unit.  Alert and oriented.  Informed consent signed and in chart.   TX duration:  Patient tolerated well.  Transported back to the room  Alert, without acute distress.  Hand-off given to patient's nurse.   Access used: left AVF Access issues: none  Total UF removed: 1.5L Medication(s) given: calcitriol, vancomycin    06/09/22 1721  Vitals  Temp 98.6 F (37 C)  Temp Source Oral  BP 127/70  MAP (mmHg) 85  BP Location Right Arm  BP Method Automatic  Patient Position (if appropriate) Lying  Pulse Rate 100  Pulse Rate Source Monitor  Oxygen Therapy  SpO2 93 %  O2 Device Room Air  During Treatment Monitoring  Blood Flow Rate (mL/min) 400 mL/min  Arterial Pressure (mmHg) -170 mmHg  Venous Pressure (mmHg) 280 mmHg  TMP (mmHg) -12 mmHg  Ultrafiltration Rate (mL/min) 594 mL/min  Dialysate Flow Rate (mL/min) 300 ml/min  HD Safety Checks Performed Yes  Intra-Hemodialysis Comments Tx completed  Dialysis Fluid Bolus Normal Saline  Bolus Amount (mL) 300 mL  Post Treatment  Dialyzer Clearance Lightly streaked  Duration of HD Treatment -hour(s) 3.15 hour(s)  Liters Processed 78  Fluid Removed (mL) 1500 mL  Tolerated HD Treatment Yes  AVG/AVF Arterial Site Held (minutes) 7 minutes  AVG/AVF Venous Site Held (minutes) 7 minutes  Fistula / Graft Left Forearm Arteriovenous fistula  No placement date or time found.   Orientation: Left  Access Location: Forearm  Access Type: Arteriovenous fistula  Status Deaccessed;Flushed      Mercades Bajaj S [facility]

## 2022-06-09 NOTE — Assessment & Plan Note (Addendum)
LE wound pathway X ray from [facility] worrisome for osteomyelitis and fracture of calcaneus Rocephin, flagyl, vanc ABI Call ortho surgery vs podiatry (day teams choice) in AM. Limb threatening infection, (see photos).

## 2022-06-09 NOTE — Consult Note (Signed)
[facility] Nurse Consult Note: Reason for Consult: LLE wound Patient sent by podiatry Phoebe Perch) to hospital for abx tx.  Patient history ESRD, DM, HTN.  LE venous stasis dermatitis Wound type: Neuropathic vs arterial, mixed etiology; heel pressure injury ABIs pending, MRI pending  Left lateral foot: blood filled blister that extends on the plantar surface and to dorsal foot. Areas on the dorsal foot forming eschar Left plantar surface: just medial to above dark purple; deep tissue injury Left heel: Unstageable Pressure Injury; 100% eschar   Pressure Injury POA: Yes Measurement:see nursing flow sheets Wound bed:see above  Drainage (amount, consistency, odor) see nursing flow sheets Periwound: charcot foot deformity  Dressing procedure/placement/frequency: Paint with betadine, cover with xeroform Pending evaluation per ortho vs podiatry.  Would also assume VVS after ABIs and MRI resulted.    Re consult if needed, will not follow at this time. Thanks  Ryanna Teschner [employer], RN,CWOCN, CNS, [place] [phone])

## 2022-06-09 NOTE — Assessment & Plan Note (Addendum)
Small stage 1 sacral decubitus. 1x0.5 in Wound care consult (for this as well as foot)

## 2022-06-09 NOTE — Procedures (Signed)
   I was present at this dialysis session, have reviewed the session itself and made  appropriate changes Kelly Splinter MD [place] pager [phone]   06/09/2022, 3:53 PM

## 2022-06-09 NOTE — Inpatient Diabetes Management (Signed)
Inpatient Diabetes Program Recommendations  AACE/ADA: New Consensus Statement on Inpatient Glycemic Control (2015)  Target Ranges:  Prepandial:   less than 140 mg/dL      Peak postprandial:   less than 180 mg/dL (1-2 hours)      Critically ill patients:  140 - 180 mg/dL   Lab Results  Component Value Date   GLUCAP 311 (H) 06/09/2022   HGBA1C 7.4 (H) 06/08/2022    Review of Glycemic Control  Latest Reference Range & Units 06/08/22 20:02 06/09/22 07:52 06/09/22 12:02  Glucose-Capillary 70 - 99 mg/dL 246 (H) 321 (H) 311 (H)  (H): Data is abnormally high  Diabetes history: DM2 Outpatient Diabetes medications: Amaryl 2 mg QD, Rybelsus 14 mg QD Current orders for Inpatient glycemic control: Novolog 0-6 units TID and 0-5 units QHS  Inpatient Diabetes Program Recommendations:    Please consider Semglee 16 units QD (0.15 units x 109.6 kg)  Will continue to follow while inpatient.  Thank you, Reche Dixon, MSN, [place] Diabetes Coordinator Inpatient Diabetes Program [phone] (team pager from 8a-5p)

## 2022-06-09 NOTE — Assessment & Plan Note (Signed)
Hold home PO hypoglycemics and semglutide. Very sensitive SSI AC/HS for the moment.

## 2022-06-09 NOTE — Progress Notes (Signed)
Pharmacy Antibiotic Note  Scott Castillo is a 62 y.o. male admitted on 06/08/2022 with  diabetic foot infection .  Pharmacy has been consulted for vancomycin dosing for 7 days. Patient has PMH of DM2, HTN, ESRD on MWF dialysis, severe diabetic neuropathy of lower extremities, and history of ulcer with abscess drained in December and Xray at [facility] worrisome for osteomyelitis with calcaneous fracture present. Was given a dose of vancomycin 2g given at 1117 AM and cefepime 2g at 1415 on 2/20 Labs at [place] were sCr 6 (CrCl ~13m/min), BUN 47, WBC 21.6, PCT 1.71, afebrile, elevated HR, and Bcx pending. Last hemodialysis session unknown.  Plan: Ceftriaxone 2g IV every 24 hours (dosed per MD) Flagyl 5013mPO q 12h (dosed per MD) Vancomycin 1g IV every M,W,F post hemodialysis.  Monitor hemodialysis schedule that continues Mon, Wed, Fri Monitor Cultures, fever curve, WBC, signs of clinical improvement and plan.  Height: 6' (182.9 cm) Weight: 109.6 kg (241 lb 10 oz) IBW/kg (Calculated) : 77.6  Temp (24hrs), Avg:97.7 F (36.5 C), Min:97.7 F (36.5 C), Max:97.7 F (36.5 C)   Allergies  Allergen Reactions   Heparin Other (See Comments)    Per pt he has episodes where he can't speak. He has tolerated this med multiple times since this entered so unlikely it is true allergy   Pioglitazone Swelling    Site of swelling not recalled by patient   Adhesive [Tape] Other (See Comments)    Causes redness, pt prefers paper tape    Latex Rash    Redness, also    Antimicrobials this admission: Ceftriaxone 2/21 >>  flagyl 2/21 >>  Vancomycin 2/20>>  Microbiology results: 2/20 BCx: pending 2/20 MRSA PCR: sent  Thank you for allowing pharmacy to be a part of this patient's care.  JaSandford CrazePharmD. [facility] PGY-1  06/09/2022 2:08 AM

## 2022-06-09 NOTE — Progress Notes (Signed)
[facility] Progress Note Patient: Scott Castillo [id number] DOB: [birth year] DOA: 06/08/2022  DOS: the patient was seen and examined on 06/09/2022  Brief hospital course: PMH of type II DM, HTN, ESRD on HD MWF, neuropathy, nephropathy, retinopathy, nonhealing ulcers on the leg seen by podiatry. Present to the hospital with complaints of worsening wound and drainage ongoing since December 2023.  Was initially sent to current of hospital ER from podiatry office-[facility] pod[facility]ry. 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  Orthopedic consulted.  Most likely will require surgical management tentatively on Friday.  Nephrology also following for HD needs. Assessment and Plan: Osteomyelitis with pathological fracture. Gas-forming infection organism. No blood cultures performed before initiation of antibiotic here in the hospital. I do not see any blood cultures performed at the end of hospital as well. Will perform blood cultures. Initially started on Vanco Flagyl and Rocephin. I changed to Paden. ABI unable to compress the vasculature. Orthopedic consulted.  Patient will be seen for possible surgery on Friday.  Right hip pain. X-ray unremarkable. Monitor.  ESRD on HD MWF. Nephrology consulted. Undergoing HD in the hospital.  HTN. Blood pressure stable. Continue home regimen.  Type 2 diabetes mellitus with ESRD and neuropathy with nonhealing wound, uncontrolled with hyperglycemia. Currently on sliding scale insulin.   Subjective: No nausea no vomiting no fever no chills.  Due to neuropathy no significant pain reported.  Physical Exam: General: in Mild distress, No Rash Cardiovascular: S1 and S2 Present, No Murmur Respiratory: Good respiratory effort, Bilateral Air entry present. No Crackles, No wheezes Abdomen: Bowel Sound present, No tenderness Extremities: bilateral edema Neuro: Alert and oriented x3, no new focal deficit  Data Reviewed: I have Reviewed  nursing notes, Vitals, and Lab results. Since last encounter, pertinent lab results CBC and BMP   . I have ordered test including CBC BMP blood culture  . I have discussed pt's care plan and test results with nephrology and orthopedics Dr. Sharol Given  . I have ordered imaging MRI  .   Disposition: Status is: Inpatient Remains inpatient appropriate because: Need for IV antibiotics and further workup and therapy  heparin injection 5,000 Units Start: 06/09/22 0600   Family Communication: No one at bedside Level of care: Med-Surg  Vitals:   06/09/22 1600 06/09/22 1630 06/09/22 1700 06/09/22 1721  BP: 123/70 108/76 120/83 127/70  Pulse: 98 85 (!) 55 100  Resp:  17 (!) 22   Temp:    98.6 F (37 C)  TempSrc:    Oral  SpO2: 93% 93% 93% 93%  Weight:    112.2 kg  Height:         Author: Berle Mull, MD 06/09/2022 7:23 PM  Please look on [email or web address] to find out who is on call.

## 2022-06-09 NOTE — Assessment & Plan Note (Signed)
R hip pain following a fall in recent past. Getting X ray.

## 2022-06-09 NOTE — Progress Notes (Signed)
Pharmacy Antibiotic Note  Scott Castillo is a 62 y.o. male admitted on 06/08/2022 with  diabetic foot infection . Pharmacy was consulted for vancomycin dosing for 7 days on admit. Now also consulted for Zosyn.   Patient has PMH of DM2, HTN, ESRD on MWF dialysis, severe diabetic neuropathy of lower extremities, and history of ulcer with abscess drained in December. Xray at [facility] worrisome for osteomyelitis with calcaneous fracture present. Vancomycin 2g IV at 1117 AM and cefepime 2g IV at 1415 given at [facility] on 06/08/22.  In HD this afternoon.   Plan: Vancomycin 1gm IV MWF after HD to begin post-HD. Will begin Zosyn 2.25 gm IV q8h after HD. Monitor HD schedule for any need to adjust timing of Vanc doses. Follow up culture data, clinical course and antibiotic plans.  Height: 6' (182.9 cm) Weight: 113.8 kg (250 lb 14.1 oz) IBW/kg (Calculated) : 77.6  Temp (24hrs), Avg:97.9 F (36.6 C), Min:97.4 F (36.3 C), Max:98.7 F (37.1 C)  Recent Labs  Lab 06/09/22 0238  WBC 16.4*  CREATININE 6.95*    ESRD  Allergies  Allergen Reactions   Heparin Other (See Comments)    Per pt he has episodes where he can't speak. He has tolerated this med multiple times since this entered so unlikely it is true allergy   Pioglitazone Swelling    Site of swelling not recalled by patient   Adhesive [Tape] Other (See Comments)    Causes redness, pt prefers paper tape    Latex Rash    Redness, also    Antimicrobials this admission: Vancomycin 2/20 (2gm at [facility] 11am)>> Cefepime 2gm x 1 at [facility] on 2/20 Ceftriaxone x 1 on 2/21  Flagyl PO 2/21 x 2 doses  Zosyn 2/21>>  Dose adjustments this admission: n/a  Microbiology results: 2/21 blood: sent 2/21 MRSA PCR: pending  Thank you for allowing pharmacy to be a part of this patient's care.  Arty Baumgartner, [place] 06/09/2022 2:34 PM

## 2022-06-09 NOTE — H&P (Addendum)
History and Physical    Patient: Scott Castillo DOB: [birth year] DOA: 06/08/2022 DOS: the patient was seen and examined on 06/09/2022 PCP: Algis Greenhouse, MD  Patient coming from: Outside Hospital  Chief Complaint:  Chief Complaint  Patient presents with   osteomylitis   wound care   HPI: Scott Castillo is a 62 y.o. male with medical history significant of DM2, HTN, ESRD on MWF dialysis.  ESRD in setting of diabetic nephropathy, also has known diabetic retinopathy and severe diabetic neuropathy of LE.  Pt in to ED at [facility] with diabetic foot infection and ulcer.  Ulcer with abscess drained in Dec.  Has been being cared fore by podiatry.  Despite ABx and wound care has now progressed to infection.  Saw Dr. Gean [facility] podiatry) today who sent him in to ED at [facility] for ABx.  X ray at [facility] worrisome for osteomyelitis with calcaneus fracture present.  Pt transferred to [facility] for availability of surgery for foot.  Despite appearance, fracture, infection, etc.  Pt reports only a 3/10 pain earlier today in foot, notes his diabetic neuropathy is pretty severe.  Review of Systems: As mentioned in the history of present illness. All other systems reviewed and are negative. 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   H/O pancreatic cancer    HLD (hyperlipidemia)    HTN (hypertension)    Lumbar radiculopathy, chronic    hemilamectomy '94   Venous stasis dermatitis of both lower extremities    Past Surgical History:  Procedure Laterality Date   A/V FISTULAGRAM Left 09/27/2019   Procedure: A/V FISTULAGRAM;  Surgeon: Marty Heck, MD;  Location: [place];  Service: Cardiovascular;  Laterality: Left;   AV FISTULA PLACEMENT Left 09/17/2016   Procedure: ARTERIOVENOUS (AV) FISTULA CREATION;  Surgeon: [name],  MD;  Location: [facility] OR;  Service: Vascular;  Laterality: Left;   CATARACT EXTRACTION W/ INTRAOCULAR LENS IMPLANT Left 2015   ENDOVENOUS ABLATION SAPHENOUS VEIN W/ LASER Right 04/08/2016   EVLA R greater saphenous vein by Curt Jews MD   ENDOVENOUS ABLATION SAPHENOUS VEIN W/ LASER Left 05/13/2016   endovenous laser ablation (left greater saphenous vein) by Curt Jews MD    FOOT SURGERY     HEMILAMINOTOMY LUMBAR SPINE  '94   TONSILLECTOMY     Social History:  reports that he has never smoked. He has never used smokeless tobacco. He reports that he does not drink alcohol and does not use drugs.  Allergies  Allergen Reactions   Heparin Other (See Comments)    Per pt he has episodes where he can't speak. He has tolerated this med multiple times since this entered so unlikely it is true allergy   Pioglitazone Swelling    Site of swelling not recalled by patient   Adhesive [Tape] Other (See Comments)    Causes redness, pt prefers paper tape    Latex Rash    Redness, also    Family History  Problem Relation Age of Onset   Edema Mother    Heart attack Father    Heart failure Father    Heart attack Brother    Heart attack Brother     Prior to Admission medications   Medication Sig Start Date End Date Taking? Authorizing Provider  ascorbic  acid (VITAMIN C) 1000 MG tablet Take 1,000 mg by mouth daily.   Yes [provider]  atorvastatin (LIPITOR) 40 MG tablet Take 1 tablet (40 mg total) by mouth daily. 04/22/21 06/09/23 Yes Atway, Rayann N, DO  carvedilol (COREG) 25 MG tablet Take 1 tablet (25 mg total) by mouth 2 (two) times daily with a meal. 04/22/21 06/09/23 Yes Atway, Rayann N, DO  co-enzyme Q-10 30 MG capsule Take 30 mg by mouth 2 (two) times daily.   Yes [provider]  cyanocobalamin 1000 MCG tablet Take 1,000 mcg by mouth daily.   Yes [provider]  glimepiride (AMARYL) 2 MG tablet Take 2 mg by mouth daily with breakfast. 12/14/21  Yes [provider]  irbesartan (AVAPRO) 150 MG tablet Take 150 mg by mouth daily. 09/05/19  Yes [provider]  loperamide (IMODIUM) 2 MG capsule Take 1 capsule (2 mg total) by mouth every 6 (six) hours as needed for diarrhea or loose stools. 09/08/20  Yes Sheikh, Omair Latif, DO  Probiotic Product (UP4 PROBIOTICS) CHEW Chew 1 tablet by mouth daily.   Yes [provider]  SANTYL 250 UNIT/GM ointment Apply 1 Application topically daily.   Yes [provider]  Semaglutide (RYBELSUS) 14 MG TABS Take 14 mg by mouth daily. 10/05/18  Yes [provider]  torsemide (DEMADEX) 100 MG tablet Take 100 mg by mouth See admin instructions. Takes on Tuesday,Thursday,Saturday and Sunday(non-dialysis days) 02/20/20  Yes [provider]  VELPHORO 500 MG chewable tablet Chew 1,000 mg by mouth 3 (three) times daily with meals. 03/15/22   [provider]    Physical Exam: Vitals:   06/08/22 1953  BP: 132/88  Pulse: 68  Resp: 18  Temp: 97.7 F (36.5 C)  TempSrc: Oral  SpO2: 99%  Weight: 109.6 kg  Height: 6' (1.829 m)   Constitutional: NAD, calm, comfortable Respiratory: clear to auscultation bilaterally, no wheezing, no crackles. Normal respiratory effort. No accessory muscle use.  Cardiovascular: Regular rate and rhythm, no murmurs / rubs / gallops. No extremity edema. 2+ pedal pulses. No carotid bruits.  Abdomen: no tenderness, no masses palpated. No hepatosplenomegaly. Bowel sounds positive.  Skin: Chronic venous stasis changes of both lower legs, but no venous stasis ulcer above the ankle.  Regarding the foot however, see photos below.      Neurologic: CN 2-12 grossly intact. Sensation intact, DTR normal. Strength 5/5 in all 4.  Psychiatric: Normal judgment and insight. Alert and oriented x 3. Normal mood.   Data Reviewed:  Results are pending, will review when available.    Assessment and Plan: * Diabetic infection of left foot ([place]) LE wound pathway X  ray from [facility] worrisome for osteomyelitis and fracture of calcaneus Rocephin, flagyl, vanc ABI Call ortho surgery vs podiatry (day teams choice) in AM. Limb threatening infection, (see photos).  Right hip pain R hip pain following a fall in recent past. Getting X ray.  HTN (hypertension) Cont Coreg, irbesartan, torsemide on non-dialysis days.  Pressure injury of skin Small stage 1 sacral decubitus. 1x0.5 in Wound care consult (for this as well as foot)  ESRD (end stage renal disease) ([place]) On MWF dialysis. Call nephrology in AM for routine dialysis during hospital stay.  Type 2 diabetes mellitus with ESRD (end-stage renal disease) ([place]) Hold home PO hypoglycemics and semglutide. Very sensitive SSI AC/HS for the moment.      Advance Care Planning:   Code Status: Full Code  Consults: None, call nephro  and ortho vs podiatry in AM  Family Communication: No family in room, answered all patient questions.  Severity of Illness: The appropriate patient status for this patient is INPATIENT. Inpatient status is judged to be reasonable and necessary in order to provide the required intensity of service to ensure the patient's safety. The patient's presenting symptoms, physical exam findings, and initial radiographic and laboratory data in the context of their chronic comorbidities is felt to place them at high risk for further clinical deterioration. Furthermore, it is not anticipated that the patient will be medically stable for discharge from the hospital within 2 midnights of admission.   * I certify that at the point of admission it is my clinical judgment that the patient will require inpatient hospital care spanning beyond 2 midnights from the point of admission due to high intensity of service, high risk for further deterioration and high frequency of surveillance required.*  Author: Etta Quill., DO 06/09/2022 1:18 AM  For on call review [email or web address].

## 2022-06-09 NOTE — Progress Notes (Signed)
Initial Nutrition Assessment  DOCUMENTATION CODES:   Obesity unspecified  INTERVENTION:  - Add Nepro Shake po BID, each supplement provides 425 kcal and 19 grams protein   - Add -1 packet Juven BID, each packet provides 95 calories, 2.5 grams of protein (collagen), and 9.8 grams of carbohydrate (3 grams sugar); also contains 7 grams of L-arginine and L-glutamine, 300 mg vitamin C, 15 mg vitamin E, 1.2 mcg vitamin B-12, 9.5 mg zinc, 200 mg calcium, and 1.5 g  Calcium Beta-hydroxy-Beta-methylbutyrate to support wound healing   - Add Rena-vit q day.   NUTRITION DIAGNOSIS:   Increased nutrient needs related to wound healing as evidenced by estimated needs.  GOAL:   Patient will meet greater than or equal to 90% of their needs  MONITOR:   PO intake, Supplement acceptance  REASON FOR ASSESSMENT:   Consult Wound healing  ASSESSMENT:   62 y.o. male admits related to osteomyelitis and wound care. PMH includes: DM, pancreatic cancer, HLD, HTN, venous stasis dermatitis of lower extremities. Pt is currently receiving medical management related to diabetic infection of left foot.  Meds reviewed: lipitor, sliding scale insulin, flagyl. Labs reviewed: Na low, K low, BUN/Creatinine high. FS BG 246-321 mg/dL. HgA1c 7.4%.   Pt reports that he has been eating well since admission. He reports that he normally has a good appetite and was also eating well PTA. He denies any wt loss. No wt loss per record. However, pt does have increased energy requirements due to wound healing. Plan is also for pt to undergo L BKA tomorrow. RD will add protein shakes as well as Juven BID and vitamins to aide in wound healing.   NUTRITION - FOCUSED PHYSICAL EXAM:  Flowsheet Row Most Recent Value  Orbital Region No depletion  Upper Arm Region No depletion  Thoracic and Lumbar Region No depletion  Buccal Region No depletion  Temple Region Mild depletion  Clavicle Bone Region Mild depletion  Clavicle and  Acromion Bone Region No depletion  Scapular Bone Region No depletion  Dorsal Hand Mild depletion  Patellar Region No depletion  Anterior Thigh Region No depletion  Posterior Calf Region No depletion  Edema (RD Assessment) None  Hair Reviewed  Eyes Reviewed  Mouth Reviewed  Skin Reviewed  Nails Reviewed       Diet Order:   Diet Order             Diet renal/carb modified with fluid restriction Diet-HS Snack? Nothing; Fluid restriction: 1200 mL Fluid; Room service appropriate? Yes; Fluid consistency: Thin  Diet effective now                   EDUCATION NEEDS:   Not appropriate for education at this time  Skin:  Skin Assessment: Skin Integrity Issues: Skin Integrity Issues:: Stage II, DTI DTI: left heel Stage II: right coccyx  Last BM:  2/21  Height:   Ht Readings from Last 1 Encounters:  06/08/22 6' (1.829 m)    Weight:   Wt Readings from Last 1 Encounters:  06/09/22 112.2 kg    Ideal Body Weight:     BMI:  Body mass index is 33.55 kg/m.  Estimated Nutritional Needs:   Kcal:  [id number] kcals  Protein:  110-135 gm  Fluid:  >/= 2.1 L  Thalia Bloodgood, RD, LDN, CNSC.

## 2022-06-09 NOTE — Consult Note (Signed)
Renal Service Consult Note [facility]  Scott Castillo 06/09/2022 Scott Blazing, MD Requesting Physician: Dr. Posey Pronto, [name].   Reason for Consult: ESRD pt w/ infected foot ulcer, possible osteomyelitis HPI: The patient is a 62 y.o. year-old w/ PMH as below who presented to [facility] ED sent by podiatrist for worsening foot infection. The pt rec'd abx IV and was transferred to [facility] for admission. Xray at [facility] was worrisome for osteomyelitis. We are asked to see for dialysis.    Pt seen in room. Pt is pleasant. Gets rides to HD via friends lately, lives w/ his wife. No sob or cough.   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   H/O pancreatic cancer    HLD (hyperlipidemia)    HTN (hypertension)    Lumbar radiculopathy, chronic    hemilamectomy '94   Venous stasis dermatitis of both lower extremities    Past Surgical History  Past Surgical History:  Procedure Laterality Date   A/V FISTULAGRAM Left 09/27/2019   Procedure: A/V FISTULAGRAM;  Surgeon: Marty Heck, MD;  Location: [facility];  Service: Cardiovascular;  Laterality: Left;   AV FISTULA PLACEMENT Left 09/17/2016   Procedure: ARTERIOVENOUS (AV) FISTULA CREATION;  Surgeon: [name], MD;  Location: [facility] OR;  Service: Vascular;  Laterality: Left;   CATARACT EXTRACTION W/ INTRAOCULAR LENS IMPLANT Left 2015   ENDOVENOUS ABLATION SAPHENOUS VEIN W/ LASER Right 04/08/2016   EVLA R greater saphenous vein by Curt Jews MD   ENDOVENOUS ABLATION SAPHENOUS VEIN W/ LASER Left 05/13/2016   endovenous laser ablation (left greater saphenous vein) by Curt Jews MD    FOOT SURGERY     HEMILAMINOTOMY  LUMBAR SPINE  '94   TONSILLECTOMY     Family History  Family History  Problem Relation Age of Onset   Edema Mother    Heart attack Father    Heart failure Father    Heart attack Brother    Heart attack Brother    Social History  reports that he has never smoked. He has never used smokeless tobacco. He reports that he does not drink alcohol and does not use drugs. Allergies  Allergies  Allergen Reactions   Heparin Other (See Comments)    Per pt he has episodes where he can't speak. He has tolerated this med multiple times since this entered so unlikely it is true allergy   Pioglitazone Swelling    Site of swelling not recalled by patient   Adhesive [Tape] Other (See Comments)    Causes redness, pt prefers paper tape    Latex Rash    Redness, also   Home medications Prior to Admission medications   Medication Sig Start Date End Date Taking? Authorizing Provider  ascorbic acid (VITAMIN C) 1000 MG tablet Take 1,000 mg by mouth daily.   Yes [provider]  atorvastatin (LIPITOR) 40 MG tablet Take 1 tablet (40 mg total) by mouth daily. 04/22/21 06/09/23 Yes Atway, Rayann N, DO  carvedilol (COREG) 25 MG tablet Take 1 tablet (25 mg total) by mouth 2 (two) times daily with a meal. 04/22/21 06/09/23 Yes Atway,  Rayann N, DO  co-enzyme Q-10 30 MG capsule Take 30 mg by mouth 2 (two) times daily.   Yes [provider]  cyanocobalamin 1000 MCG tablet Take 1,000 mcg by mouth daily.   Yes [provider]  glimepiride (AMARYL) 2 MG tablet Take 2 mg by mouth daily with breakfast. 12/14/21  Yes [provider]  irbesartan (AVAPRO) 150 MG tablet Take 150 mg by mouth daily. 09/05/19  Yes [provider]  loperamide (IMODIUM) 2 MG capsule Take 1 capsule (2 mg total) by mouth every 6 (six) hours as needed for diarrhea or loose stools. 09/08/20  Yes Sheikh, Omair Latif, DO  Probiotic Product (UP4 PROBIOTICS) CHEW Chew 1 tablet by mouth daily.   Yes [provider]  SANTYL 250 UNIT/GM ointment Apply 1 Application topically daily.   Yes [provider]  Semaglutide (RYBELSUS) 14 MG TABS Take 14 mg by mouth daily. 10/05/18  Yes [provider]  torsemide (DEMADEX) 100 MG tablet Take 100 mg by mouth See admin instructions. Takes on Tuesday,Thursday,Saturday and Sunday(non-dialysis days) 02/20/20  Yes [provider]  VELPHORO 500 MG chewable tablet Chew 1,000 mg by mouth 3 (three) times daily with meals. 03/15/22   [provider]     Vitals:   06/08/22 1953 06/09/22 0430 06/09/22 0947  BP: 132/88 (!) 125/100 (!) 155/101  Pulse: 68 100 79  Resp: 18 18 17  $ Temp: 97.7 F (36.5 C) 97.8 F (36.6 C) (!) 97.4 F (36.3 C)  TempSrc: Oral Oral Oral  SpO2: 99% 95% 95%  Weight: 109.6 kg    Height: 6' (1.829 m)     Exam Gen alert, no distress No rash, cyanosis or gangrene Sclera anicteric, throat clear  No jvd or bruits Chest clear bilat to bases, no rales/ wheezing RRR no MRG Abd soft ntnd no mass or ascites +bs GU normal male Ext moderate bilat lower leg edema / inflammation, no hip / abd wall edema Neuro is alert, Ox 3 , nf    LFA AVF+bruit      Home meds include - lipitor, coreg 25 bid, amaryl, irbesartan 150 qd, semaglutide, demadex 138m on non-hd days, velphoro 10017mac tid, prns/ vits/ supps     OP HD: [facility] MWF 4h 1544m 400/800   108.5kg   2/2 bath  LFA AVF  Hep none - last HD 2/19, post wt 108kg   - rocaltrol 1.25 mcg po tiw - mircera 225 mcg IV  q 2wks, last 2/12, due 2/26 - last Hb 8.6 on 2/14    Na 129  K 3.3   BUN 50  Cr 6.9  Ca 9.0  Hb 7.5   wBC 16K    BP's a bit high, HR 80, RR 17 afeb    Assessment/ Plan: Diabetic infection L foot - getting IV abx w/ vanc, flagyl and rocephin ESRD - on HD MWF. Has not missed HD. Plan HD today or tonight.  HTN/ volume - BP is good, cont home meds. Get vol down w/ HD as well. Get wt's when in a room Anemia esrd - Hb 7.5, on esa at OP unit  next dose due on 2/26. Follow.  MBD ckd - Ca in range, add on phos/ alb. Cont po vdra and velphoro as binder.  DM2 per pmd   RobKelly SplinterD CKA 06/09/2022, 10:09 AM  Recent Labs  Lab 06/09/22 0238  HGB 7.5*  CALCIUM 9.0  CREATININE 6.95*  K 3.3*  Inpatient medications:  atorvastatin  40 mg Oral Daily   carvedilol  25 mg Oral BID WC   heparin  5,000 Units Subcutaneous Q8H   insulin aspart  0-5 Units Subcutaneous QHS   insulin aspart  0-6 Units Subcutaneous TID WC   irbesartan  150 mg Oral Daily   lactobacillus  1 g Oral TID WC   metroNIDAZOLE  500 mg Oral Q12H   sucroferric oxyhydroxide  1,000 mg Oral TID WC   [START ON 06/10/2022] torsemide  100 mg Oral Once per day on Sun Tue Thu Sat    [START ON 06/10/2022] cefTRIAXone (ROCEPHIN)  IV     vancomycin     acetaminophen **OR** acetaminophen, ondansetron **OR** ondansetron (ZOFRAN) IV, mouth rinse

## 2022-06-09 NOTE — Progress Notes (Signed)
ABI's have been completed. Preliminary results can be found in CV Proc through chart review.   06/09/22 12:32 PM Scott Castillo RVT

## 2022-06-09 NOTE — Assessment & Plan Note (Signed)
Cont Coreg, irbesartan, torsemide on non-dialysis days.

## 2022-06-10 DIAGNOSIS — L089 Local infection of the skin and subcutaneous tissue, unspecified: Secondary | ICD-10-CM | POA: Diagnosis not present

## 2022-06-10 DIAGNOSIS — M86272 Subacute osteomyelitis, left ankle and foot: Secondary | ICD-10-CM | POA: Diagnosis not present

## 2022-06-10 DIAGNOSIS — E11628 Type 2 diabetes mellitus with other skin complications: Secondary | ICD-10-CM | POA: Diagnosis not present

## 2022-06-10 LAB — GLUCOSE, CAPILLARY
Glucose-Capillary: 258 mg/dL — ABNORMAL HIGH (ref 70–99)
Glucose-Capillary: 320 mg/dL — ABNORMAL HIGH (ref 70–99)
Glucose-Capillary: 315 mg/dL — ABNORMAL HIGH (ref 70–99)
Glucose-Capillary: 297 mg/dL — ABNORMAL HIGH (ref 70–99)

## 2022-06-10 LAB — CBC WITH DIFFERENTIAL/PLATELET
Abs Immature Granulocytes: 0.18 10*3/uL — ABNORMAL HIGH (ref 0.00–0.07)
Basophils Absolute: 0 10*3/uL (ref 0.0–0.1)
Basophils Relative: 0 %
Eosinophils Absolute: 0.1 10*3/uL (ref 0.0–0.5)
Eosinophils Relative: 1 %
HCT: 24.2 % — ABNORMAL LOW (ref 39.0–52.0)
Hemoglobin: 7.6 g/dL — ABNORMAL LOW (ref 13.0–17.0)
Immature Granulocytes: 1 %
Lymphocytes Relative: 4 %
Lymphs Abs: 0.6 10*3/uL — ABNORMAL LOW (ref 0.7–4.0)
MCH: 27.5 pg (ref 26.0–34.0)
MCHC: 31.4 g/dL (ref 30.0–36.0)
MCV: 87.7 fL (ref 80.0–100.0)
Monocytes Absolute: 0.7 10*3/uL (ref 0.1–1.0)
Monocytes Relative: 5 %
Neutro Abs: 12.3 10*3/uL — ABNORMAL HIGH (ref 1.7–7.7)
Neutrophils Relative %: 89 %
Platelets: 271 10*3/uL (ref 150–400)
RBC: 2.76 MIL/uL — ABNORMAL LOW (ref 4.22–5.81)
RDW: 17.7 % — ABNORMAL HIGH (ref 11.5–15.5)
WBC: 13.9 10*3/uL — ABNORMAL HIGH (ref 4.0–10.5)
nRBC: 0 % (ref 0.0–0.2)

## 2022-06-10 LAB — COMPREHENSIVE METABOLIC PANEL
ALT: 24 U/L (ref 0–44)
AST: 40 U/L (ref 15–41)
Albumin: 1.7 g/dL — ABNORMAL LOW (ref 3.5–5.0)
Alkaline Phosphatase: 98 U/L (ref 38–126)
Anion gap: 14 (ref 5–15)
BUN: 44 mg/dL — ABNORMAL HIGH (ref 6–20)
CO2: 26 mmol/L (ref 22–32)
Calcium: 8.8 mg/dL — ABNORMAL LOW (ref 8.9–10.3)
Chloride: 89 mmol/L — ABNORMAL LOW (ref 98–111)
Creatinine, Ser: 6.08 mg/dL — ABNORMAL HIGH (ref 0.61–1.24)
GFR, Estimated: 10 mL/min — ABNORMAL LOW (ref >60)
Glucose, Bld: 309 mg/dL — ABNORMAL HIGH (ref 70–99)
Potassium: 3.9 mmol/L (ref 3.5–5.1)
Sodium: 129 mmol/L — ABNORMAL LOW (ref 135–145)
Total Bilirubin: 0.7 mg/dL (ref 0.3–1.2)
Total Protein: 6.4 g/dL — ABNORMAL LOW (ref 6.5–8.1)

## 2022-06-10 LAB — PREPARE RBC (CROSSMATCH)

## 2022-06-10 LAB — HEPATITIS B SURFACE ANTIBODY, QUANTITATIVE: Hep B S AB Quant (Post): 313.9 m[IU]/mL (ref >9.9)

## 2022-06-10 LAB — CK: Total CK: 128 U/L (ref 49–397)

## 2022-06-10 LAB — ABO/RH: ABO/RH(D): O POS

## 2022-06-10 LAB — PHOSPHORUS: Phosphorus: 4.8 mg/dL — ABNORMAL HIGH (ref 2.5–4.6)

## 2022-06-10 LAB — MAGNESIUM: Magnesium: 2 mg/dL (ref 1.7–2.4)

## 2022-06-10 LAB — MRSA NEXT GEN BY PCR, NASAL: MRSA by PCR Next Gen: NOT DETECTED

## 2022-06-10 MED ORDER — SODIUM CHLORIDE 0.9% IV SOLUTION: Freq: Once | INTRAVENOUS | Status: AC

## 2022-06-10 MED ORDER — JUVEN PO PACK
1.0000 | PACK | Freq: Two times a day (BID) | ORAL | Status: DC
  Administered 2022-06-10 – 2022-06-15 (×8): 1 via ORAL
  Filled 2022-06-10 (×9): qty 1

## 2022-06-10 MED ORDER — PROSOURCE PLUS PO LIQD
30.0000 mL | Freq: Three times a day (TID) | ORAL | Status: DC
  Administered 2022-06-10 – 2022-06-15 (×11): 30 mL via ORAL
  Filled 2022-06-10 (×11): qty 30

## 2022-06-10 MED ORDER — NEPRO/CARBSTEADY PO LIQD
237.0000 mL | Freq: Two times a day (BID) | ORAL | Status: DC
  Administered 2022-06-10 – 2022-06-15 (×7): 237 mL via ORAL

## 2022-06-10 MED ORDER — RENA-VITE PO TABS
1.0000 | ORAL_TABLET | Freq: Every day | ORAL | Status: DC
  Administered 2022-06-10 – 2022-06-14 (×5): 1 via ORAL
  Filled 2022-06-10 (×5): qty 1

## 2022-06-10 NOTE — Progress Notes (Signed)
[facility] Progress Note Patient: Scott Castillo [id number] DOB: 1961/02/25 DOA: 06/08/2022  DOS: the patient was seen and examined on 06/10/2022  Brief hospital course: PMH of type II DM, HTN, ESRD on HD MWF, neuropathy, nephropathy, retinopathy, nonhealing ulcers on the leg seen by podiatry. Present to the hospital with complaints of worsening wound and drainage ongoing since December 2023.  Was initially sent to current of hospital ER from podiatry office-[facility]. 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  Orthopedic consulted.  Most likely will require surgical management tentatively on Friday.  Nephrology also following for HD needs. 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 Infection from gas-forming organism. No blood cultures performed before initiation of antibiotic here in the hospital.  Follow-up on blood cultures. Initially started on Vanco Flagyl and Rocephin. I changed to [place]. ABI unable to compress the vasculature. Orthopedic consulted.  Patient will be seen for possible surgery on Friday.   Right hip pain. X-ray unremarkable. Monitor.   ESRD on HD MWF. Nephrology consulted. Undergoing HD in the hospital.   HTN. Blood pressure stable. Continue home regimen.   Type 2 diabetes mellitus with ESRD and neuropathy with nonhealing wound, uncontrolled with hyperglycemia. Currently on sliding scale insulin.   Subjective: No nausea no vomiting no fever no chills.  No chest pain.  Unchanged pain in the lower extremity.  Physical Exam: General: in Mild distress, No Rash Cardiovascular: S1 and S2 Present, No Murmur Respiratory: Good respiratory effort, Bilateral Air entry present. No Crackles, No wheezes Abdomen: Bowel Sound present, No tenderness Extremities: Bilateral edema Neuro: Alert and oriented x3, no new focal deficit  Data Reviewed: I have  Reviewed nursing notes, Vitals, and Lab results. Since last encounter, pertinent lab results CBC and BMP   . I have ordered test including CBC and BMP  .   Disposition: Status is: Inpatient Remains inpatient appropriate because: Need for IV antibiotics and further therapy  heparin injection 5,000 Units Start: 06/09/22 0600   Family Communication: No one at bedside Level of care: Med-Surg  Vitals:   06/10/22 1249 06/10/22 1508 06/10/22 1522 06/10/22 1530  BP: 108/73 116/80 120/82 131/89  Pulse: 87 84 86 80  Resp: 19 18 18 18  $ Temp: 98.2 F (36.8 C) (!) 97.2 F (36.2 C) (!) 97.2 F (36.2 C) 98.1 F (36.7 C)  TempSrc: Oral Oral Oral Oral  SpO2:  96% 96% 96%  Weight:      Height:         Author: Berle Mull, MD 06/10/2022 5:34 PM  Please look on [email or web address] to find out who is on call.

## 2022-06-10 NOTE — Inpatient Diabetes Management (Signed)
Inpatient Diabetes Program Recommendations  AACE/ADA: New Consensus Statement on Inpatient Glycemic Control (2015)  Target Ranges:  Prepandial:   less than 140 mg/dL      Peak postprandial:   less than 180 mg/dL (1-2 hours)      Critically ill patients:  140 - 180 mg/dL   Lab Results  Component Value Date   GLUCAP 258 (H) 06/10/2022   HGBA1C 7.4 (H) 06/08/2022    Review of Glycemic Control  Latest Reference Range & Units 06/09/22 07:52 06/09/22 12:02 06/09/22 18:01 06/09/22 22:24 06/10/22 07:28  Glucose-Capillary 70 - 99 mg/dL 321 (H) 311 (H) 224 (H) 272 (H) 258 (H)  (H): Data is abnormally high  Diabetes history: DM2 Outpatient Diabetes medications: Amaryl 2 mg QD, Rybelsus 14 mg QD Current orders for Inpatient glycemic control: Novolog 0-6 units TID and 0-5 units QHS   Inpatient Diabetes Program Recommendations:     Please consider Semglee 16 units QD (0.15 units x 109.6 kg)  Will continue to follow while inpatient.  Thank you, Reche Dixon, MSN, [place] Diabetes Coordinator Inpatient Diabetes Program [phone] (team pager from 8a-5p)

## 2022-06-10 NOTE — Progress Notes (Signed)
Pt receives out-pt HD at [facility] on MWF. Contacted clinic to confirm pt's chair time in the event rehab is needed after surgery. Will assist as needed.   Melven Sartorius Renal Navigator [phone]

## 2022-06-10 NOTE — H&P (View-Only) (Signed)
ORTHOPAEDIC CONSULTATION  REQUESTING PHYSICIAN: Lavina Hamman, MD  Chief Complaint: Abscess left foot  HPI: Scott Castillo is a 62 y.o. male who presents with chronic abscess left foot.  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  Patient states he has undergone serial debridement in [place] without resolution of the abscess.  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   H/O pancreatic cancer    HLD (hyperlipidemia)    HTN (hypertension)    Lumbar radiculopathy, chronic    hemilamectomy '94   Venous stasis dermatitis of both lower extremities    Past Surgical History:  Procedure Laterality Date   A/V FISTULAGRAM Left 09/27/2019   Procedure: A/V FISTULAGRAM;  Surgeon: Marty Heck, MD;  Location: [facility];  Service: Cardiovascular;  Laterality: Left;   AV FISTULA PLACEMENT Left 09/17/2016   Procedure: ARTERIOVENOUS (AV) FISTULA CREATION;  Surgeon: [name], MD;  Location: [facility];  Service: Vascular;  Laterality: Left;   CATARACT EXTRACTION W/ INTRAOCULAR LENS IMPLANT Left 2015   ENDOVENOUS ABLATION SAPHENOUS VEIN W/ LASER Right 04/08/2016   EVLA R greater saphenous vein by Curt Jews MD   ENDOVENOUS ABLATION SAPHENOUS VEIN W/ LASER Left 05/13/2016   endovenous laser ablation (left greater saphenous vein) by Curt Jews MD    FOOT SURGERY     HEMILAMINOTOMY LUMBAR SPINE  '94   TONSILLECTOMY     Social History   Socioeconomic History   Marital status: Married    Spouse name: Not on file   Number of children: Not on file   Years of education: Not on file   Highest education level: Not on file  Occupational History   Not on file  Tobacco Use   Smoking status: Never   Smokeless tobacco: Never  Substance and Sexual Activity   Alcohol  use: No   Drug use: No   Sexual activity: Not on file  Other Topics Concern   Not on file  Social History Narrative   Not on file   Social Determinants of Health   Financial Resource Strain: Not on file  Food Insecurity: No Food Insecurity (06/08/2022)   Hunger Vital Sign    Worried About Running Out of Food in the Last Year: Never true    Ran Out of Food in the Last Year: Never true  Transportation Needs: No Transportation Needs (06/08/2022)   PRAPARE - Hydrologist (Medical): No    Lack of Transportation (Non-Medical): No  Physical Activity: Not on file  Stress: Not on file  Social Connections: Not on file   Family History  Problem Relation Age of Onset   Edema Mother    Heart attack Father    Heart failure Father    Heart attack Brother    Heart attack Brother    - negative except otherwise stated in the family history section Allergies  Allergen Reactions   Heparin Other (See Comments)    Per pt he has episodes where he can't speak. He has tolerated this med multiple times since this entered so unlikely it is true allergy   Pioglitazone Swelling    Site of swelling not recalled by patient   Adhesive [Tape] Other (See Comments)    Causes  redness, pt prefers paper tape    Latex Rash    Redness, also   Prior to Admission medications   Medication Sig Start Date End Date Taking? Authorizing Provider  ascorbic acid (VITAMIN C) 1000 MG tablet Take 1,000 mg by mouth daily.   Yes [provider]  atorvastatin (LIPITOR) 40 MG tablet Take 1 tablet (40 mg total) by mouth daily. 04/22/21 06/09/23 Yes Atway, Rayann N, DO  carvedilol (COREG) 25 MG tablet Take 1 tablet (25 mg total) by mouth 2 (two) times daily with a meal. 04/22/21 06/09/23 Yes Atway, Rayann N, DO  co-enzyme Q-10 30 MG capsule Take 30 mg by mouth 2 (two) times daily.   Yes [provider]  cyanocobalamin 1000 MCG tablet Take 1,000 mcg by mouth daily.   Yes [provider]  glimepiride (AMARYL) 2 MG tablet Take 2 mg by mouth daily with breakfast. 12/14/21  Yes [provider]  irbesartan (AVAPRO) 150 MG tablet Take 150 mg by mouth daily. 09/05/19  Yes [provider]  loperamide (IMODIUM) 2 MG capsule Take 1 capsule (2 mg total) by mouth every 6 (six) hours as needed for diarrhea or loose stools. 09/08/20  Yes Sheikh, Omair Latif, DO  Probiotic Product (UP4 PROBIOTICS) CHEW Chew 1 tablet by mouth daily.   Yes [provider]  SANTYL 250 UNIT/GM ointment Apply 1 Application topically daily.   Yes [provider]  Semaglutide (RYBELSUS) 14 MG TABS Take 14 mg by mouth daily. 10/05/18  Yes [provider]  torsemide (DEMADEX) 100 MG tablet Take 100 mg by mouth See admin instructions. Takes on Tuesday,Thursday,Saturday and "Sunday(non-dialysis days) 02/20/20  Yes [provider]  VELPHORO 500 MG chewable tablet Chew 1,000 mg by mouth 3 (three) times daily with meals. 03/15/22   [provider]   VAS US ABI WITH/WO TBI  Result Date: 06/09/2022  LOWER EXTREMITY DOPPLER STUDY Patient Name:  Scott Castillo  Date of Exam:   06/09/2022 Medical Rec #: [id number]        Accession #:    [id number] Date of Birth: 12/12/1960        Patient Gender: M Patient Age:   61 years Exam Location:  [facility] Procedure:      VAS US ABI WITH/WO TBI Referring Phys: JARED GARDNER --------------------------------------------------------------------------------  Indications: Ulceration. High Risk Factors: Hypertension, hyperlipidemia, Diabetes.  Vascular Interventions: 09/17/2016 - ARTERIOVENOUS (AV) FISTULA CREATION Left. Limitations: Today's exam was limited due to an open wound, bandages and Great              toe girth. Comparison Study: No prior studies. Performing Technologist: Collins, Greg RVT  Examination Guidelines: A complete evaluation includes at minimum, Doppler waveform signals and systolic blood pressure reading  at the level of bilateral brachial, anterior tibial, and posterior tibial arteries, when vessel segments are accessible. Bilateral testing is considered an integral part of a complete examination. Photoelectric Plethysmograph (PPG) waveforms and toe systolic pressure readings are included as required and additional duplex testing as needed. Limited examinations for reoccurring indications may be performed as noted.  ABI Findings: +--------+------------------+-----+-----------+--------+ Right   Rt Pressure (mmHg)IndexWaveform   Comment  +--------+------------------+-----+-----------+--------+ Brachial127                    triphasic           +--------+------------------+-----+-----------+--------+ PTA     25"$ 4               2.00  multiphasic         +--------+------------------+-----+-----------+--------+ DP      254               2.00 multiphasic         +--------+------------------+-----+-----------+--------+ +--------+------------------+-----+-----------+-------+ Left    Lt Pressure (mmHg)IndexWaveform   Comment +--------+------------------+-----+-----------+-------+ Brachial                                  AVF     +--------+------------------+-----+-----------+-------+ PTA     254               2.00 multiphasic        +--------+------------------+-----+-----------+-------+ DP      254               2.00 multiphasic        +--------+------------------+-----+-----------+-------+ +-------+-----------+-----------+------------+------------+ ABI/TBIToday's ABIToday's TBIPrevious ABIPrevious TBI +-------+-----------+-----------+------------+------------+ Right  [place]                                             +-------+-----------+-----------+------------+------------+ Left   [place]                                             +-------+-----------+-----------+------------+------------+   Summary: Right: Resting right ankle-brachial index indicates noncompressible  right lower extremity arteries. Unable to obtain TBI due to great toe girth. Left: Resting left ankle-brachial index indicates noncompressible left lower extremity arteries. Unable to obtain TBI due to great toe girth. *See table(s) above for measurements and observations.  Electronically signed by Jamelle Haring on 06/09/2022 at 10:05:17 PM.    Final    MR FOOT LEFT WO CONTRAST  Result Date: 06/09/2022 CLINICAL DATA:  Soft tissue infection suspected, foot, xray done EXAM: MRI OF THE LEFT FOOT WITHOUT CONTRAST TECHNIQUE: Multiplanar, multisequence MR imaging of the left foot was performed. No intravenous contrast was administered. COMPARISON:  Radiograph 06/08/2022 FINDINGS: Bones/Joint/Cartilage There is a tongue-type posterior calcaneus fracture with extensive marrow edema. There is fluid within the fracture cleft and soft tissue gas. There is a tibiotalar joint effusion with periarticular marrow edema and erosive changes at the tear lateral distal tibia and adjacent anterior aspect of the lateral malleolus (coronal T1 image 18). 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 Prior partial second toe amputation. There is extensive marrow edema within the fifth metatarsal most prominent at the distal metatarsal head/neck. Ligaments Intact Lisfranc ligament. Muscles and Tendons Chronic denervation changes. Mild tendons of itis of the PT tendon and peroneal tendons. Soft tissues Extensive soft tissue swelling of the foot with scattered soft tissue gas as seen on recent radiograph. Along the lateral midfoot, there is thick heterogeneous T2 hyperintense soft tissue of similar intensity to the fluid within the calcaneal fracture cleft, measuring up to 5.7 x 4.7 cm in the axial plane and at least 8.3 cm in proximal-distal extent. This is present along the plantar midfoot and hindfoot posteriorly and may communicate with the fluid in the calcaneal fracture cleft (series 5 images 20-46). IMPRESSION:  Displaced tongue-type posterior calcaneus fracture with extensive associated marrow edema and probable perifracture osteomyelitis. Fluid and soft tissue gas noted within the fracture cleft. Extensive soft tissue swelling and scattered gas of  the foot, with thick heterogeneous fluid signal extending along the lateral midfoot/forefoot, along the plantar musculature posteriorly and potentially communicating with the fluid within the fracture cleft. This heterogeneous collection measures a maximum 5.7 x 4.7 x 8.3 cm along the lateral midfoot, and could represent phlegmon/abscess. 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 Tibiotalar joint effusion with periarticular marrow edema and mild erosive changes in the anterolateral distal tibia and adjacent anterior aspect of the lateral malleolus, concerning for potential infectious involvement of the tibiotalar joint. Additional marrow edema signal within the fifth metatarsal most prominent in the region of the distal metatarsal head/neck, concerning for early osteomyelitis. Mild tenosynovitis of the PT tendon and peroneal tendons. Electronically Signed   By: Maurine Simmering M.D.   On: 06/09/2022 13:57   DG HIP UNILAT WITH PELVIS 2-3 VIEWS RIGHT  Result Date: 06/09/2022 CLINICAL DATA:  Right hip pain for several days. The patient reports a recent fall. EXAM: DG HIP (WITH OR WITHOUT PELVIS) 2-3V RIGHT COMPARISON:  None Available. FINDINGS: There is no acute or focal bony or joint abnormality. The patient has moderate bilateral hip osteoarthritis. Chondrocalcinosis is seen about both hips. Extensive vascular calcifications are noted. IMPRESSION: No acute abnormality. Moderate bilateral hip osteoarthritis. Electronically Signed   By: Inge Rise M.D.   On: 06/09/2022 08:18   - pertinent xrays, CT, MRI studies were reviewed and independently interpreted  Positive ROS: All other systems have been reviewed and were otherwise negative with the exception  of those mentioned in the HPI and as above.  Physical Exam: General: Alert, no acute distress Psychiatric: Patient is competent for consent with normal mood and affect Lymphatic: No axillary or cervical lymphadenopathy Cardiovascular: No pedal edema Respiratory: No cyanosis, no use of accessory musculature GI: No organomegaly, abdomen is soft and non-tender    Images:  '@ENCIMAGES'$ @  Labs:  Lab Results  Component Value Date   HGBA1C 7.4 (H) 06/08/2022   HGBA1C 8.5 (H) 04/20/2021   HGBA1C 6.4 (H) 08/19/2020   ESRSEDRATE 92 (H) 06/09/2022   CRP 15.3 (H) 06/09/2022   CRP 1.0 (H) 10/07/2020   REPTSTATUS PENDING 06/09/2022   REPTSTATUS PENDING 06/09/2022   CULT  06/09/2022    NO GROWTH < 24 HOURS Performed at [facility], [place] [address], [place], [place] [address]    CULT  06/09/2022    NO GROWTH < 24 HOURS Performed at [facility] [address], [place],  [address]    LABORGA METHICILLIN RESISTANT STAPHYLOCOCCUS AUREUS 09/03/2020    Lab Results  Component Value Date   ALBUMIN 2.8 (L) 04/22/2021   ALBUMIN 3.0 (L) 04/21/2021   ALBUMIN 3.1 (L) 04/20/2021   PREALBUMIN 5 (L) 06/09/2022        Latest Ref Rng & Units 06/09/2022    2:38 AM 04/22/2021    2:28 AM 04/21/2021    2:07 AM  CBC EXTENDED  WBC 4.0 - 10.5 K/uL 16.4  7.1  9.7   RBC 4.22 - 5.81 MIL/uL 2.74  3.10  2.98   Hemoglobin 13.0 - 17.0 g/dL 7.5  8.9  8.5   HCT 39.0 - 52.0 % 23.8  27.5  26.4   Platelets 150 - 400 K/uL 304  222  229     Neurologic: Patient does not have protective sensation bilateral lower extremities.   MUSCULOSKELETAL:   Skin: Examination patient has a large fluctuant abscess over the lateral aspect the left foot.  There is a necrotic ulcer over the  left heel that is fluctuant.  Patient does not have palpable pulses due to the swelling.  Ankle-brachial indices shows noncompressible flow to the ankle.  Review of the MRI scan shows a Charcot tongue type calcaneal  fracture with a large associated abscess that extends to the skin.  Patient has osteomyelitis of the calcaneus.  Patient also has a large abscess over the lateral fifth metatarsal head with osteomyelitis of the fifth metatarsal.  White blood cell count 16.4 with a hemoglobin of 7.6.  Albumin 2.8.  Hemoglobin A1c 7.4.  Sed rate 92. BUN 50 with creatinine of 6.95.  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  Plan: Will plan for a left below-knee amputation on Friday afternoon.    Ideally change dialysis to Saturday to allow for surgery on Friday.    I will write an order to type and cross and transfuse with 1 unit packed red blood cells.  Thank you for the consult and the opportunity to see Scott Castillo, [place] [phone] 7:32 AM

## 2022-06-10 NOTE — Consult Note (Signed)
ORTHOPAEDIC CONSULTATION  REQUESTING PHYSICIAN: Lavina Hamman, MD  Chief Complaint: Abscess left foot  HPI: Scott Castillo is a 62 y.o. male who presents with chronic abscess left foot.  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  Patient states he has undergone serial debridement in [place] without resolution of the abscess.  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   H/O pancreatic cancer    HLD (hyperlipidemia)    HTN (hypertension)    Lumbar radiculopathy, chronic    hemilamectomy '94   Venous stasis dermatitis of both lower extremities    Past Surgical History:  Procedure Laterality Date   A/V FISTULAGRAM Left 09/27/2019   Procedure: A/V FISTULAGRAM;  Surgeon: Marty Heck, MD;  Location: [facility];  Service: Cardiovascular;  Laterality: Left;   AV FISTULA PLACEMENT Left 09/17/2016   Procedure: ARTERIOVENOUS (AV) FISTULA CREATION;  Surgeon: [name], MD;  Location: [facility];  Service: Vascular;  Laterality: Left;   CATARACT EXTRACTION W/ INTRAOCULAR LENS IMPLANT Left 2015   ENDOVENOUS ABLATION SAPHENOUS VEIN W/ LASER Right 04/08/2016   EVLA R greater saphenous vein by Curt Jews MD   ENDOVENOUS ABLATION SAPHENOUS VEIN W/ LASER Left 05/13/2016   endovenous laser ablation (left greater saphenous vein) by Curt Jews MD    FOOT SURGERY     HEMILAMINOTOMY LUMBAR SPINE  '94   TONSILLECTOMY     Social History   Socioeconomic History   Marital status: Married    Spouse name: Not on file   Number of children: Not on file   Years of education: Not on file   Highest education level: Not on file  Occupational History   Not on file  Tobacco Use   Smoking status: Never   Smokeless tobacco: Never  Substance and Sexual Activity   Alcohol  use: No   Drug use: No   Sexual activity: Not on file  Other Topics Concern   Not on file  Social History Narrative   Not on file   Social Determinants of Health   Financial Resource Strain: Not on file  Food Insecurity: No Food Insecurity (06/08/2022)   Hunger Vital Sign    Worried About Running Out of Food in the Last Year: Never true    Ran Out of Food in the Last Year: Never true  Transportation Needs: No Transportation Needs (06/08/2022)   PRAPARE - Hydrologist (Medical): No    Lack of Transportation (Non-Medical): No  Physical Activity: Not on file  Stress: Not on file  Social Connections: Not on file   Family History  Problem Relation Age of Onset   Edema Mother    Heart attack Father    Heart failure Father    Heart attack Brother    Heart attack Brother    - negative except otherwise stated in the family history section Allergies  Allergen Reactions   Heparin Other (See Comments)    Per pt he has episodes where he can't speak. He has tolerated this med multiple times since this entered so unlikely it is true allergy   Pioglitazone Swelling    Site of swelling not recalled by patient   Adhesive [Tape] Other (See Comments)    Causes  redness, pt prefers paper tape    Latex Rash    Redness, also   Prior to Admission medications   Medication Sig Start Date End Date Taking? Authorizing Provider  ascorbic acid (VITAMIN C) 1000 MG tablet Take 1,000 mg by mouth daily.   Yes [provider]  atorvastatin (LIPITOR) 40 MG tablet Take 1 tablet (40 mg total) by mouth daily. 04/22/21 06/09/23 Yes Atway, Rayann N, DO  carvedilol (COREG) 25 MG tablet Take 1 tablet (25 mg total) by mouth 2 (two) times daily with a meal. 04/22/21 06/09/23 Yes Atway, Rayann N, DO  co-enzyme Q-10 30 MG capsule Take 30 mg by mouth 2 (two) times daily.   Yes [provider]  cyanocobalamin 1000 MCG tablet Take 1,000 mcg by mouth daily.   Yes [provider]  glimepiride (AMARYL) 2 MG tablet Take 2 mg by mouth daily with breakfast. 12/14/21  Yes [provider]  irbesartan (AVAPRO) 150 MG tablet Take 150 mg by mouth daily. 09/05/19  Yes [provider]  loperamide (IMODIUM) 2 MG capsule Take 1 capsule (2 mg total) by mouth every 6 (six) hours as needed for diarrhea or loose stools. 09/08/20  Yes Sheikh, Omair Latif, DO  Probiotic Product (UP4 PROBIOTICS) CHEW Chew 1 tablet by mouth daily.   Yes [provider]  SANTYL 250 UNIT/GM ointment Apply 1 Application topically daily.   Yes [provider]  Semaglutide (RYBELSUS) 14 MG TABS Take 14 mg by mouth daily. 10/05/18  Yes [provider]  torsemide (DEMADEX) 100 MG tablet Take 100 mg by mouth See admin instructions. Takes on Tuesday,Thursday,Saturday and Sunday(non-dialysis days) 02/20/20  Yes [provider]  VELPHORO 500 MG chewable tablet Chew 1,000 mg by mouth 3 (three) times daily with meals. 03/15/22   [provider]   VAS US ABI WITH/WO TBI  Result Date: 06/09/2022  LOWER EXTREMITY DOPPLER STUDY Patient Name:  Jaasiel E Garmon  Date of Exam:   06/09/2022 Medical Rec #: [id number]        Accession #:    [id number] Date of Birth: 08/27/1960        Patient Gender: M Patient Age:   61 years Exam Location:  [facility] Procedure:      VAS US ABI WITH/WO TBI Referring Phys: JARED GARDNER --------------------------------------------------------------------------------  Indications: Ulceration. High Risk Factors: Hypertension, hyperlipidemia, Diabetes.  Vascular Interventions: 09/17/2016 - ARTERIOVENOUS (AV) FISTULA CREATION Left. Limitations: Today's exam was limited due to an open wound, bandages and Great              toe girth. Comparison Study: No prior studies. Performing Technologist: Collins, Greg RVT  Examination Guidelines: A complete evaluation includes at minimum, Doppler waveform signals and systolic blood pressure reading  at the level of bilateral brachial, anterior tibial, and posterior tibial arteries, when vessel segments are accessible. Bilateral testing is considered an integral part of a complete examination. Photoelectric Plethysmograph (PPG) waveforms and toe systolic pressure readings are included as required and additional duplex testing as needed. Limited examinations for reoccurring indications may be performed as noted.  ABI Findings: +--------+------------------+-----+-----------+--------+ Right   Rt Pressure (mmHg)IndexWaveform   Comment  +--------+------------------+-----+-----------+--------+ Brachial127                    triphasic           +--------+------------------+-----+-----------+--------+ PTA     25$ 4               2.00  multiphasic         +--------+------------------+-----+-----------+--------+ DP      254               2.00 multiphasic         +--------+------------------+-----+-----------+--------+ +--------+------------------+-----+-----------+-------+ Left    Lt Pressure (mmHg)IndexWaveform   Comment +--------+------------------+-----+-----------+-------+ Brachial                                  AVF     +--------+------------------+-----+-----------+-------+ PTA     254               2.00 multiphasic        +--------+------------------+-----+-----------+-------+ DP      254               2.00 multiphasic        +--------+------------------+-----+-----------+-------+ +-------+-----------+-----------+------------+------------+ ABI/TBIToday's ABIToday's TBIPrevious ABIPrevious TBI +-------+-----------+-----------+------------+------------+ Right  [place]                                             +-------+-----------+-----------+------------+------------+ Left   [place]                                             +-------+-----------+-----------+------------+------------+   Summary: Right: Resting right ankle-brachial index indicates noncompressible  right lower extremity arteries. Unable to obtain TBI due to great toe girth. Left: Resting left ankle-brachial index indicates noncompressible left lower extremity arteries. Unable to obtain TBI due to great toe girth. *See table(s) above for measurements and observations.  Electronically signed by Jamelle Haring on 06/09/2022 at 10:05:17 PM.    Final    MR FOOT LEFT WO CONTRAST  Result Date: 06/09/2022 CLINICAL DATA:  Soft tissue infection suspected, foot, xray done EXAM: MRI OF THE LEFT FOOT WITHOUT CONTRAST TECHNIQUE: Multiplanar, multisequence MR imaging of the left foot was performed. No intravenous contrast was administered. COMPARISON:  Radiograph 06/08/2022 FINDINGS: Bones/Joint/Cartilage There is a tongue-type posterior calcaneus fracture with extensive marrow edema. There is fluid within the fracture cleft and soft tissue gas. There is a tibiotalar joint effusion with periarticular marrow edema and erosive changes at the tear lateral distal tibia and adjacent anterior aspect of the lateral malleolus (coronal T1 image 18). 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 Prior partial second toe amputation. There is extensive marrow edema within the fifth metatarsal most prominent at the distal metatarsal head/neck. Ligaments Intact Lisfranc ligament. Muscles and Tendons Chronic denervation changes. Mild tendons of itis of the PT tendon and peroneal tendons. Soft tissues Extensive soft tissue swelling of the foot with scattered soft tissue gas as seen on recent radiograph. Along the lateral midfoot, there is thick heterogeneous T2 hyperintense soft tissue of similar intensity to the fluid within the calcaneal fracture cleft, measuring up to 5.7 x 4.7 cm in the axial plane and at least 8.3 cm in proximal-distal extent. This is present along the plantar midfoot and hindfoot posteriorly and may communicate with the fluid in the calcaneal fracture cleft (series 5 images 20-46). IMPRESSION:  Displaced tongue-type posterior calcaneus fracture with extensive associated marrow edema and probable perifracture osteomyelitis. Fluid and soft tissue gas noted within the fracture cleft. Extensive soft tissue swelling and scattered gas of  the foot, with thick heterogeneous fluid signal extending along the lateral midfoot/forefoot, along the plantar musculature posteriorly and potentially communicating with the fluid within the fracture cleft. This heterogeneous collection measures a maximum 5.7 x 4.7 x 8.3 cm along the lateral midfoot, and could represent phlegmon/abscess. 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 Tibiotalar joint effusion with periarticular marrow edema and mild erosive changes in the anterolateral distal tibia and adjacent anterior aspect of the lateral malleolus, concerning for potential infectious involvement of the tibiotalar joint. Additional marrow edema signal within the fifth metatarsal most prominent in the region of the distal metatarsal head/neck, concerning for early osteomyelitis. Mild tenosynovitis of the PT tendon and peroneal tendons. Electronically Signed   By: Maurine Simmering M.D.   On: 06/09/2022 13:57   DG HIP UNILAT WITH PELVIS 2-3 VIEWS RIGHT  Result Date: 06/09/2022 CLINICAL DATA:  Right hip pain for several days. The patient reports a recent fall. EXAM: DG HIP (WITH OR WITHOUT PELVIS) 2-3V RIGHT COMPARISON:  None Available. FINDINGS: There is no acute or focal bony or joint abnormality. The patient has moderate bilateral hip osteoarthritis. Chondrocalcinosis is seen about both hips. Extensive vascular calcifications are noted. IMPRESSION: No acute abnormality. Moderate bilateral hip osteoarthritis. Electronically Signed   By: Inge Rise M.D.   On: 06/09/2022 08:18   - pertinent xrays, CT, MRI studies were reviewed and independently interpreted  Positive ROS: All other systems have been reviewed and were otherwise negative with the exception  of those mentioned in the HPI and as above.  Physical Exam: General: Alert, no acute distress Psychiatric: Patient is competent for consent with normal mood and affect Lymphatic: No axillary or cervical lymphadenopathy Cardiovascular: No pedal edema Respiratory: No cyanosis, no use of accessory musculature GI: No organomegaly, abdomen is soft and non-tender    Images:  @ENCIMAGES$ @  Labs:  Lab Results  Component Value Date   HGBA1C 7.4 (H) 06/08/2022   HGBA1C 8.5 (H) 04/20/2021   HGBA1C 6.4 (H) 08/19/2020   ESRSEDRATE 92 (H) 06/09/2022   CRP 15.3 (H) 06/09/2022   CRP 1.0 (H) 10/07/2020   REPTSTATUS PENDING 06/09/2022   REPTSTATUS PENDING 06/09/2022   CULT  06/09/2022    NO GROWTH < 24 HOURS Performed at [facility], [place] [address] [place], [place] [address]    CULT  06/09/2022    NO GROWTH < 24 HOURS Performed at [facility] [address], [place], [place] [address]    LABORGA METHICILLIN RESISTANT STAPHYLOCOCCUS AUREUS 09/03/2020    Lab Results  Component Value Date   ALBUMIN 2.8 (L) 04/22/2021   ALBUMIN 3.0 (L) 04/21/2021   ALBUMIN 3.1 (L) 04/20/2021   PREALBUMIN 5 (L) 06/09/2022        Latest Ref Rng & Units 06/09/2022    2:38 AM 04/22/2021    2:28 AM 04/21/2021    2:07 AM  CBC EXTENDED  WBC 4.0 - 10.5 K/uL 16.4  7.1  9.7   RBC 4.22 - 5.81 MIL/uL 2.74  3.10  2.98   Hemoglobin 13.0 - 17.0 g/dL 7.5  8.9  8.5   HCT 39.0 - 52.0 % 23.8  27.5  26.4   Platelets 150 - 400 K/uL 304  222  229     Neurologic: Patient does not have protective sensation bilateral lower extremities.   MUSCULOSKELETAL:   Skin: Examination patient has a large fluctuant abscess over the lateral aspect the left foot.  There is a necrotic ulcer over the  left heel that is fluctuant.  Patient does not have palpable pulses due to the swelling.  Ankle-brachial indices shows noncompressible flow to the ankle.  Review of the MRI scan shows a Charcot tongue type calcaneal  fracture with a large associated abscess that extends to the skin.  Patient has osteomyelitis of the calcaneus.  Patient also has a large abscess over the lateral fifth metatarsal head with osteomyelitis of the fifth metatarsal.  White blood cell count 16.4 with a hemoglobin of 7.6.  Albumin 2.8.  Hemoglobin A1c 7.4.  Sed rate 92. BUN 50 with creatinine of 6.95.  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  Plan: Will plan for a left below-knee amputation on Friday afternoon.    Ideally change dialysis to Saturday to allow for surgery on Friday.    I will write an order to type and cross and transfuse with 1 unit packed red blood cells.  Thank you for the consult and the opportunity to see Mr. Zolton Chalifour, [facility] [phone] 7:32 AM

## 2022-06-10 NOTE — Progress Notes (Signed)
[facility] Progress Note   Subjective:    Seen and examined patient at bedside. Tolerated yesterday's HD with net UF 1.5L. Scheduled for left BKA tomorrow afternoon. Discussed with Dr. Sharol Given. Plan for HD 1st case on Saturday 2/24.  Objective Vitals:   06/10/22 0516 06/10/22 0800 06/10/22 1245 06/10/22 1249  BP: 120/70 (!) 131/90 117/62 108/73  Pulse: 80 89 77 87  Resp: 17 18 18 19  $ Temp: 97.7 F (36.5 C) 97.6 F (36.4 C) 97.7 F (36.5 C) 98.2 F (36.8 C)  TempSrc: Oral Oral Oral Oral  SpO2: 100% 93% 92%   Weight:      Height:       Physical Exam General: Awake, alert, on RA, NAD Heart: S1 and S2; No murmurs, gallops, or rubs Lungs: Clear throughout; No wheezing, rales, or rhonchi Abdomen: Soft and non-tender Extremities:1+ LE edema bilaterally and discolored; chronic abscess left foot Dialysis Access: L AVF (+) B/T   Filed Weights   06/08/22 1953 06/09/22 1332 06/09/22 1721  Weight: 109.6 kg 113.8 kg 112.2 kg    Intake/Output Summary (Last 24 hours) at 06/10/2022 1427 Last data filed at 06/10/2022 1249 Gross per 24 hour  Intake 865.15 ml  Output 1500 ml  Net -634.85 ml    Additional Objective Labs: Basic Metabolic Panel: Recent Labs  Lab 06/09/22 0238 06/10/22 0817  NA 129* 129*  K 3.3* 3.9  CL 87* 89*  CO2 28 26  GLUCOSE 330* 309*  BUN 50* 44*  CREATININE 6.95* 6.08*  CALCIUM 9.0 8.8*  PHOS  --  4.8*   Liver Function Tests: Recent Labs  Lab 06/10/22 0817  AST 40  ALT 24  ALKPHOS 98  BILITOT 0.7  PROT 6.4*  ALBUMIN 1.7*   No results for input(s): "LIPASE", "AMYLASE" in the last 168 hours. CBC: Recent Labs  Lab 06/09/22 0238 06/10/22 0817  WBC 16.4* 13.9*  NEUTROABS  --  12.3*  HGB 7.5* 7.6*  HCT 23.8* 24.2*  MCV 86.9 87.7  PLT 304 271   Blood Culture    Component Value Date/Time   SDES BLOOD RIGHT FOREARM 06/09/2022 1028   SDES BLOOD RIGHT HAND 06/09/2022 1028   SPECREQUEST  06/09/2022 1028    BOTTLES DRAWN  AEROBIC AND ANAEROBIC Blood Culture adequate volume   SPECREQUEST  06/09/2022 1028    BOTTLES DRAWN AEROBIC AND ANAEROBIC Blood Culture adequate volume   CULT  06/09/2022 1028    NO GROWTH < 24 HOURS Performed at [facility] [address], [place], [place] [address]    CULT  06/09/2022 1028    NO GROWTH < 24 HOURS Performed at [facility], [place] [address] [place], [place] [address]    REPTSTATUS PENDING 06/09/2022 1028   REPTSTATUS PENDING 06/09/2022 1028    Cardiac Enzymes: Recent Labs  Lab 06/10/22 0817  CKTOTAL 128   CBG: Recent Labs  Lab 06/09/22 1202 06/09/22 1801 06/09/22 2224 06/10/22 0728 06/10/22 1126  GLUCAP 311* 224* 272* 258* 320*   Iron Studies: No results for input(s): "IRON", "TIBC", "TRANSFERRIN", "FERRITIN" in the last 72 hours. Lab Results  Component Value Date   INR 1.3 (H) 04/21/2021   INR 1.3 (H) 04/20/2021   INR 1.2 10/07/2020   Studies/Results: VAS [place] ABI WITH/WO TBI  Result Date: 06/09/2022  LOWER EXTREMITY DOPPLER STUDY Patient Name:  Scott Castillo  Date of Exam:   06/09/2022 Medical Rec #: [id number]        Accession #:  [id number] Date of Birth: [birth year]        Patient Gender: M Patient Age:   62 years Exam Location:  [facility] Procedure:      VAS [place] ABI WITH/WO TBI Referring Phys: JARED GARDNER --------------------------------------------------------------------------------  Indications: Ulceration. High Risk Factors: Hypertension, hyperlipidemia, Diabetes.  Vascular Interventions: 09/17/2016 - ARTERIOVENOUS (AV) FISTULA CREATION Left. Limitations: Today's exam was limited due to an open wound, bandages and Great              toe girth. Comparison Study: No prior studies. Performing Technologist: Carlos Levering RVT  Examination Guidelines: A complete evaluation includes at minimum, Doppler waveform signals and systolic blood pressure reading at the level of bilateral brachial, anterior tibial, and posterior tibial  arteries, when vessel segments are accessible. Bilateral testing is considered an integral part of a complete examination. Photoelectric Plethysmograph (PPG) waveforms and toe systolic pressure readings are included as required and additional duplex testing as needed. Limited examinations for reoccurring indications may be performed as noted.  ABI Findings: +--------+------------------+-----+-----------+--------+ Right   Rt Pressure (mmHg)IndexWaveform   Comment  +--------+------------------+-----+-----------+--------+ [id number]                    triphasic           +--------+------------------+-----+-----------+--------+ PTA     254               2.00 multiphasic         +--------+------------------+-----+-----------+--------+ DP      254               2.00 multiphasic         +--------+------------------+-----+-----------+--------+ +--------+------------------+-----+-----------+-------+ Left    Lt Pressure (mmHg)IndexWaveform   Comment +--------+------------------+-----+-----------+-------+ Brachial                                  AVF     +--------+------------------+-----+-----------+-------+ PTA     254               2.00 multiphasic        +--------+------------------+-----+-----------+-------+ DP      254               2.00 multiphasic        +--------+------------------+-----+-----------+-------+ +-------+-----------+-----------+------------+------------+ ABI/TBIToday's ABIToday's TBIPrevious ABIPrevious TBI +-------+-----------+-----------+------------+------------+ Right  [place]                                             +-------+-----------+-----------+------------+------------+ Left   [place]                                             +-------+-----------+-----------+------------+------------+   Summary: Right: Resting right ankle-brachial index indicates noncompressible right lower extremity arteries. Unable to obtain TBI due to great toe  girth. Left: Resting left ankle-brachial index indicates noncompressible left lower extremity arteries. Unable to obtain TBI due to great toe girth. *See table(s) above for measurements and observations.  Electronically signed by Jamelle Haring on 06/09/2022 at 10:05:17 PM.    Final    MR FOOT LEFT WO CONTRAST  Result Date: 06/09/2022 CLINICAL DATA:  Soft tissue infection suspected, foot, xray done EXAM: MRI OF THE LEFT FOOT WITHOUT CONTRAST TECHNIQUE: Multiplanar,  multisequence MR imaging of the left foot was performed. No intravenous contrast was administered. COMPARISON:  Radiograph 06/08/2022 FINDINGS: Bones/Joint/Cartilage There is a tongue-type posterior calcaneus fracture with extensive marrow edema. There is fluid within the fracture cleft and soft tissue gas. There is a tibiotalar joint effusion with periarticular marrow edema and erosive changes at the tear lateral distal tibia and adjacent anterior aspect of the lateral malleolus (coronal T1 image 18). 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 Prior partial second toe amputation. There is extensive marrow edema within the fifth metatarsal most prominent at the distal metatarsal head/neck. Ligaments Intact Lisfranc ligament. Muscles and Tendons Chronic denervation changes. Mild tendons of itis of the PT tendon and peroneal tendons. Soft tissues Extensive soft tissue swelling of the foot with scattered soft tissue gas as seen on recent radiograph. Along the lateral midfoot, there is thick heterogeneous T2 hyperintense soft tissue of similar intensity to the fluid within the calcaneal fracture cleft, measuring up to 5.7 x 4.7 cm in the axial plane and at least 8.3 cm in proximal-distal extent. This is present along the plantar midfoot and hindfoot posteriorly and may communicate with the fluid in the calcaneal fracture cleft (series 5 images 20-46). IMPRESSION: Displaced tongue-type posterior calcaneus fracture with extensive  associated marrow edema and probable perifracture osteomyelitis. Fluid and soft tissue gas noted within the fracture cleft. Extensive soft tissue swelling and scattered gas of the foot, with thick heterogeneous fluid signal extending along the lateral midfoot/forefoot, along the plantar musculature posteriorly and potentially communicating with the fluid within the fracture cleft. This heterogeneous collection measures a maximum 5.7 x 4.7 x 8.3 cm along the lateral midfoot, and could represent phlegmon/abscess. 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 Tibiotalar joint effusion with periarticular marrow edema and mild erosive changes in the anterolateral distal tibia and adjacent anterior aspect of the lateral malleolus, concerning for potential infectious involvement of the tibiotalar joint. Additional marrow edema signal within the fifth metatarsal most prominent in the region of the distal metatarsal head/neck, concerning for early osteomyelitis. Mild tenosynovitis of the PT tendon and peroneal tendons. Electronically Signed   By: Maurine Simmering M.D.   On: 06/09/2022 13:57   DG HIP UNILAT WITH PELVIS 2-3 VIEWS RIGHT  Result Date: 06/09/2022 CLINICAL DATA:  Right hip pain for several days. The patient reports a recent fall. EXAM: DG HIP (WITH OR WITHOUT PELVIS) 2-3V RIGHT COMPARISON:  None Available. FINDINGS: There is no acute or focal bony or joint abnormality. The patient has moderate bilateral hip osteoarthritis. Chondrocalcinosis is seen about both hips. Extensive vascular calcifications are noted. IMPRESSION: No acute abnormality. Moderate bilateral hip osteoarthritis. Electronically Signed   By: Inge Rise M.D.   On: 06/09/2022 08:18    Medications:  piperacillin-tazobactam (ZOSYN)  IV 2.25 g (06/10/22 0849)   vancomycin Stopped (06/09/22 1720)    atorvastatin  40 mg Oral Daily   calcitRIOL  1.25 mcg Oral Q M,W,F-HD   carvedilol  25 mg Oral BID WC   Chlorhexidine  Gluconate Cloth  6 each Topical Q0600   feeding supplement (NEPRO CARB STEADY)  237 mL Oral BID BM   heparin  5,000 Units Subcutaneous Q8H   insulin aspart  0-5 Units Subcutaneous QHS   insulin aspart  0-6 Units Subcutaneous TID WC   irbesartan  150 mg Oral Daily   lactobacillus  1 g Oral TID WC   multivitamin  1 tablet Oral QHS   nutrition supplement (JUVEN)  1 packet Oral  BID BM   sucroferric oxyhydroxide  1,000 mg Oral TID WC   torsemide  100 mg Oral Once per day on Sun Tue Thu Sat    Dialysis Orders: [facility] MWF 4h 66mn  400/800   108.5kg   2/2 bath  LFA AVF  Hep none - last HD 2/19, post wt 108kg   - rocaltrol 1.25 mcg po tiw - mircera 225 mcg IV  q 2wks, last 2/12, due 2/26 - last Hb 8.6 on 2/14  Home meds include - lipitor, coreg 25 bid, amaryl, irbesartan 150 qd, semaglutide, demadex 1058mon non-hd days, velphoro 100054mc tid, prns/ vits/ supps   Assessment/Plan: Diabetic infection L foot - getting IV abx w/ vanc, flagyl and rocephin ESRD - on HD MWF. Has not missed HD. Received HD 2/22. Plan for next HD 1st case Saturday to work around Ortho procedure. Current K+ and BUN stable. Checking labs in AM. HTN/ volume - BP is good, cont home meds. Get vol down w/ HD as well. 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 Follow.  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 Cont po vdra and velphoro as binder. Will add protein supplements DM2 per pmd  CouTobie PoetP [facility] 06/10/2022,2:27 PM  LOS: 2 days

## 2022-06-11 ENCOUNTER — Other Ambulatory Visit: Payer: Self-pay

## 2022-06-11 ENCOUNTER — Inpatient Hospital Stay (HOSPITAL_COMMUNITY): Payer: Medicare Other | Admitting: Anesthesiology

## 2022-06-11 ENCOUNTER — Encounter (HOSPITAL_COMMUNITY)
Admission: EM | Disposition: A | Discharge status: Rehabilitation Facility | Payer: Self-pay | Source: Other Acute Inpatient Hospital | Attending: Internal Medicine

## 2022-06-11 ENCOUNTER — Encounter (HOSPITAL_COMMUNITY): Payer: Self-pay | Admitting: Internal Medicine

## 2022-06-11 DIAGNOSIS — E11628 Type 2 diabetes mellitus with other skin complications: Secondary | ICD-10-CM | POA: Diagnosis not present

## 2022-06-11 DIAGNOSIS — E1169 Type 2 diabetes mellitus with other specified complication: Secondary | ICD-10-CM

## 2022-06-11 DIAGNOSIS — L089 Local infection of the skin and subcutaneous tissue, unspecified: Secondary | ICD-10-CM | POA: Diagnosis not present

## 2022-06-11 DIAGNOSIS — I10 Essential (primary) hypertension: Secondary | ICD-10-CM

## 2022-06-11 DIAGNOSIS — L02612 Cutaneous abscess of left foot: Secondary | ICD-10-CM

## 2022-06-11 DIAGNOSIS — M869 Osteomyelitis, unspecified: Secondary | ICD-10-CM

## 2022-06-11 DIAGNOSIS — M86272 Subacute osteomyelitis, left ankle and foot: Secondary | ICD-10-CM | POA: Diagnosis not present

## 2022-06-11 DIAGNOSIS — L02416 Cutaneous abscess of left lower limb: Secondary | ICD-10-CM

## 2022-06-11 HISTORY — PX: AMPUTATION: SHX166

## 2022-06-11 LAB — BPAM RBC
Blood Product Expiration Date: 202403252359
Blood Product Expiration Date: 202403252359
ISSUE DATE / TIME: 202402221236
ISSUE DATE / TIME: 202402221513
Unit Type and Rh: 5100
Unit Type and Rh: 5100

## 2022-06-11 LAB — COMPREHENSIVE METABOLIC PANEL
ALT: 31 U/L (ref 0–44)
AST: 50 U/L — ABNORMAL HIGH (ref 15–41)
Albumin: 1.9 g/dL — ABNORMAL LOW (ref 3.5–5.0)
Alkaline Phosphatase: 172 U/L — ABNORMAL HIGH (ref 38–126)
Anion gap: 14 (ref 5–15)
BUN: 71 mg/dL — ABNORMAL HIGH (ref 6–20)
CO2: 25 mmol/L (ref 22–32)
Calcium: 9.1 mg/dL (ref 8.9–10.3)
Chloride: 87 mmol/L — ABNORMAL LOW (ref 98–111)
Creatinine, Ser: 6.9 mg/dL — ABNORMAL HIGH (ref 0.61–1.24)
GFR, Estimated: 8 mL/min — ABNORMAL LOW (ref >60)
Glucose, Bld: 267 mg/dL — ABNORMAL HIGH (ref 70–99)
Potassium: 4.4 mmol/L (ref 3.5–5.1)
Sodium: 126 mmol/L — ABNORMAL LOW (ref 135–145)
Total Bilirubin: 0.6 mg/dL (ref 0.3–1.2)
Total Protein: 6.9 g/dL (ref 6.5–8.1)

## 2022-06-11 LAB — TYPE AND SCREEN
ABO/RH(D): O POS
Antibody Screen: NEGATIVE
Unit division: 0
Unit division: 0

## 2022-06-11 LAB — CBC WITH DIFFERENTIAL/PLATELET
Abs Immature Granulocytes: 0.23 10*3/uL — ABNORMAL HIGH (ref 0.00–0.07)
Basophils Absolute: 0.1 10*3/uL (ref 0.0–0.1)
Basophils Relative: 0 %
Eosinophils Absolute: 0.2 10*3/uL (ref 0.0–0.5)
Eosinophils Relative: 1 %
HCT: 27.6 % — ABNORMAL LOW (ref 39.0–52.0)
Hemoglobin: 8.9 g/dL — ABNORMAL LOW (ref 13.0–17.0)
Immature Granulocytes: 1 %
Lymphocytes Relative: 4 %
Lymphs Abs: 0.6 10*3/uL — ABNORMAL LOW (ref 0.7–4.0)
MCH: 27.8 pg (ref 26.0–34.0)
MCHC: 32.2 g/dL (ref 30.0–36.0)
MCV: 86.3 fL (ref 80.0–100.0)
Monocytes Absolute: 0.9 10*3/uL (ref 0.1–1.0)
Monocytes Relative: 5 %
Neutro Abs: 15 10*3/uL — ABNORMAL HIGH (ref 1.7–7.7)
Neutrophils Relative %: 89 %
Platelets: 285 10*3/uL (ref 150–400)
RBC: 3.2 MIL/uL — ABNORMAL LOW (ref 4.22–5.81)
RDW: 17.1 % — ABNORMAL HIGH (ref 11.5–15.5)
WBC: 16.9 10*3/uL — ABNORMAL HIGH (ref 4.0–10.5)
nRBC: 0.1 % (ref 0.0–0.2)

## 2022-06-11 LAB — MAGNESIUM: Magnesium: 2.2 mg/dL (ref 1.7–2.4)

## 2022-06-11 LAB — GLUCOSE, CAPILLARY
Glucose-Capillary: 262 mg/dL — ABNORMAL HIGH (ref 70–99)
Glucose-Capillary: 227 mg/dL — ABNORMAL HIGH (ref 70–99)
Glucose-Capillary: 215 mg/dL — ABNORMAL HIGH (ref 70–99)
Glucose-Capillary: 178 mg/dL — ABNORMAL HIGH (ref 70–99)
Glucose-Capillary: 205 mg/dL — ABNORMAL HIGH (ref 70–99)
Glucose-Capillary: 181 mg/dL — ABNORMAL HIGH (ref 70–99)
Glucose-Capillary: 261 mg/dL — ABNORMAL HIGH (ref 70–99)

## 2022-06-11 LAB — SURGICAL PCR SCREEN
MRSA, PCR: NEGATIVE
Staphylococcus aureus: NEGATIVE

## 2022-06-11 SURGERY — AMPUTATION BELOW KNEE
Anesthesia: Monitor Anesthesia Care | Site: Knee | Laterality: Left

## 2022-06-11 MED ORDER — PHENOL 1.4 % MT LIQD: 1.0000 | OROMUCOSAL | Status: DC | PRN

## 2022-06-11 MED ORDER — POTASSIUM CHLORIDE CRYS ER 20 MEQ PO TBCR: 20.0000 meq | EXTENDED_RELEASE_TABLET | Freq: Every day | ORAL | Status: DC | PRN

## 2022-06-11 MED ORDER — SODIUM CHLORIDE 0.9 % IV SOLN: INTRAVENOUS | Status: DC

## 2022-06-11 MED ORDER — FENTANYL CITRATE (PF) 100 MCG/2ML IJ SOLN: 50.0000 ug | Freq: Once | INTRAMUSCULAR | Status: AC

## 2022-06-11 MED ORDER — HYDRALAZINE HCL 20 MG/ML IJ SOLN: 5.0000 mg | INTRAMUSCULAR | Status: DC | PRN

## 2022-06-11 MED ORDER — OXYCODONE HCL 5 MG/5ML PO SOLN: 5.0000 mg | Freq: Once | ORAL | Status: AC | PRN

## 2022-06-11 MED ORDER — MIDAZOLAM HCL 2 MG/2ML IJ SOLN
INTRAMUSCULAR | Status: AC
  Administered 2022-06-11: 1 mg via INTRAVENOUS
  Filled 2022-06-11: qty 2

## 2022-06-11 MED ORDER — METOPROLOL TARTRATE 5 MG/5ML IV SOLN: 2.0000 mg | INTRAVENOUS | Status: DC | PRN

## 2022-06-11 MED ORDER — PROPOFOL 10 MG/ML IV BOLUS
INTRAVENOUS | Status: DC | PRN
  Administered 2022-06-11 (×2): 30 mg via INTRAVENOUS
  Administered 2022-06-11 (×2): 20 mg via INTRAVENOUS
  Administered 2022-06-11: 40 mg via INTRAVENOUS

## 2022-06-11 MED ORDER — VANCOMYCIN HCL 500 MG IV SOLR
INTRAVENOUS | Status: DC | PRN
  Administered 2022-06-11: 1000 mg

## 2022-06-11 MED ORDER — ALUM & MAG HYDROXIDE-SIMETH 200-200-20 MG/5ML PO SUSP
15.0000 mL | ORAL | Status: DC | PRN
  Administered 2022-06-14 – 2022-06-15 (×2): 30 mL via ORAL
  Filled 2022-06-11 (×2): qty 30

## 2022-06-11 MED ORDER — ONDANSETRON HCL 4 MG/2ML IJ SOLN: 4.0000 mg | Freq: Four times a day (QID) | INTRAMUSCULAR | Status: DC | PRN

## 2022-06-11 MED ORDER — TRANEXAMIC ACID 1000 MG/10ML IV SOLN
2000.0000 mg | INTRAVENOUS | Status: AC
  Administered 2022-06-11: 1000 mg via TOPICAL
  Filled 2022-06-11: qty 20

## 2022-06-11 MED ORDER — AMISULPRIDE (ANTIEMETIC) 5 MG/2ML IV SOLN: 10.0000 mg | Freq: Once | INTRAVENOUS | Status: DC | PRN

## 2022-06-11 MED ORDER — FENTANYL CITRATE (PF) 100 MCG/2ML IJ SOLN: 25.0000 ug | INTRAMUSCULAR | Status: DC | PRN

## 2022-06-11 MED ORDER — CEFAZOLIN SODIUM-DEXTROSE 2-4 GM/100ML-% IV SOLN: 2.0000 g | Freq: Three times a day (TID) | INTRAVENOUS | Status: DC

## 2022-06-11 MED ORDER — OXYCODONE HCL 5 MG PO TABS
ORAL_TABLET | ORAL | Status: AC
  Filled 2022-06-11: qty 1

## 2022-06-11 MED ORDER — POVIDONE-IODINE 10 % EX SWAB: 2.0000 | Freq: Once | CUTANEOUS | Status: DC

## 2022-06-11 MED ORDER — BISACODYL 5 MG PO TBEC: 5.0000 mg | DELAYED_RELEASE_TABLET | Freq: Every day | ORAL | Status: DC | PRN

## 2022-06-11 MED ORDER — JUVEN PO PACK: 1.0000 | PACK | Freq: Two times a day (BID) | ORAL | Status: DC

## 2022-06-11 MED ORDER — BUPIVACAINE LIPOSOME 1.3 % IJ SUSP
INTRAMUSCULAR | Status: AC
  Filled 2022-06-11: qty 10

## 2022-06-11 MED ORDER — POLYETHYLENE GLYCOL 3350 17 G PO PACK: 17.0000 g | PACK | Freq: Every day | ORAL | Status: DC | PRN

## 2022-06-11 MED ORDER — VANCOMYCIN HCL 1000 MG IV SOLR
INTRAVENOUS | Status: AC
  Filled 2022-06-11: qty 20

## 2022-06-11 MED ORDER — OXYCODONE HCL 5 MG PO TABS: 10.0000 mg | ORAL_TABLET | ORAL | Status: DC | PRN

## 2022-06-11 MED ORDER — HYDROMORPHONE HCL 1 MG/ML IJ SOLN: 0.5000 mg | INTRAMUSCULAR | Status: DC | PRN

## 2022-06-11 MED ORDER — PANTOPRAZOLE SODIUM 40 MG PO TBEC
40.0000 mg | DELAYED_RELEASE_TABLET | Freq: Every day | ORAL | Status: DC
  Administered 2022-06-11 – 2022-06-15 (×5): 40 mg via ORAL
  Filled 2022-06-11 (×5): qty 1

## 2022-06-11 MED ORDER — MAGNESIUM CITRATE PO SOLN: 1.0000 | Freq: Once | ORAL | Status: DC | PRN

## 2022-06-11 MED ORDER — INSULIN ASPART 100 UNIT/ML IJ SOLN: 0.0000 [IU] | INTRAMUSCULAR | Status: DC | PRN

## 2022-06-11 MED ORDER — GUAIFENESIN-DM 100-10 MG/5ML PO SYRP: 15.0000 mL | ORAL_SOLUTION | ORAL | Status: DC | PRN

## 2022-06-11 MED ORDER — VITAMIN C 500 MG PO TABS
1000.0000 mg | ORAL_TABLET | Freq: Every day | ORAL | Status: DC
  Administered 2022-06-11 – 2022-06-15 (×5): 1000 mg via ORAL
  Filled 2022-06-11 (×5): qty 2

## 2022-06-11 MED ORDER — TRANEXAMIC ACID-NACL 1000-0.7 MG/100ML-% IV SOLN
1000.0000 mg | INTRAVENOUS | Status: DC
  Filled 2022-06-11 (×2): qty 100

## 2022-06-11 MED ORDER — ZINC SULFATE 220 (50 ZN) MG PO CAPS
220.0000 mg | ORAL_CAPSULE | Freq: Every day | ORAL | Status: DC
  Administered 2022-06-11 – 2022-06-15 (×5): 220 mg via ORAL
  Filled 2022-06-11 (×5): qty 1

## 2022-06-11 MED ORDER — OXYCODONE HCL 5 MG PO TABS
5.0000 mg | ORAL_TABLET | ORAL | Status: DC | PRN
  Administered 2022-06-11: 5 mg via ORAL
  Administered 2022-06-12: 10 mg via ORAL
  Filled 2022-06-11: qty 1
  Filled 2022-06-11: qty 2

## 2022-06-11 MED ORDER — BUPIVACAINE LIPOSOME 1.3 % IJ SUSP
INTRAMUSCULAR | Status: DC | PRN
  Administered 2022-06-11: 10 mL via PERINEURAL

## 2022-06-11 MED ORDER — DOCUSATE SODIUM 100 MG PO CAPS
100.0000 mg | ORAL_CAPSULE | Freq: Every day | ORAL | Status: DC
  Administered 2022-06-12 – 2022-06-14 (×3): 100 mg via ORAL
  Filled 2022-06-11 (×3): qty 1

## 2022-06-11 MED ORDER — CEFAZOLIN SODIUM-DEXTROSE 2-4 GM/100ML-% IV SOLN
2.0000 g | INTRAVENOUS | Status: AC
  Administered 2022-06-11: 2 g via INTRAVENOUS
  Filled 2022-06-11: qty 100

## 2022-06-11 MED ORDER — ACETAMINOPHEN 325 MG PO TABS: 325.0000 mg | ORAL_TABLET | Freq: Four times a day (QID) | ORAL | Status: DC | PRN

## 2022-06-11 MED ORDER — BUPIVACAINE HCL (PF) 0.5 % IJ SOLN
INTRAMUSCULAR | Status: DC | PRN
  Administered 2022-06-11: 20 mL via PERINEURAL

## 2022-06-11 MED ORDER — 0.9 % SODIUM CHLORIDE (POUR BTL) OPTIME
TOPICAL | Status: DC | PRN
  Administered 2022-06-11: 1000 mL

## 2022-06-11 MED ORDER — CHLORHEXIDINE GLUCONATE 0.12 % MT SOLN
OROMUCOSAL | Status: AC
  Filled 2022-06-11: qty 15

## 2022-06-11 MED ORDER — VANCOMYCIN HCL IN DEXTROSE 1-5 GM/200ML-% IV SOLN
1000.0000 mg | INTRAVENOUS | Status: DC
  Filled 2022-06-11: qty 200

## 2022-06-11 MED ORDER — MIDAZOLAM HCL 2 MG/2ML IJ SOLN: 1.0000 mg | Freq: Once | INTRAMUSCULAR | Status: AC

## 2022-06-11 MED ORDER — CHLORHEXIDINE GLUCONATE 0.12 % MT SOLN: 15.0000 mL | Freq: Once | OROMUCOSAL | Status: DC

## 2022-06-11 MED ORDER — LABETALOL HCL 5 MG/ML IV SOLN: 10.0000 mg | INTRAVENOUS | Status: DC | PRN

## 2022-06-11 MED ORDER — FENTANYL CITRATE (PF) 100 MCG/2ML IJ SOLN
INTRAMUSCULAR | Status: AC
  Administered 2022-06-11: 50 ug via INTRAVENOUS
  Filled 2022-06-11: qty 2

## 2022-06-11 MED ORDER — OXYCODONE HCL 5 MG PO TABS
5.0000 mg | ORAL_TABLET | Freq: Once | ORAL | Status: AC | PRN
  Administered 2022-06-11: 5 mg via ORAL

## 2022-06-11 MED ORDER — CHLORHEXIDINE GLUCONATE 4 % EX LIQD
60.0000 mL | Freq: Once | CUTANEOUS | Status: AC
  Administered 2022-06-11: 4 via TOPICAL
  Filled 2022-06-11: qty 60

## 2022-06-11 MED ORDER — ORAL CARE MOUTH RINSE: 15.0000 mL | Freq: Once | OROMUCOSAL | Status: DC

## 2022-06-11 MED ORDER — MAGNESIUM SULFATE 2 GM/50ML IV SOLN: 2.0000 g | Freq: Every day | INTRAVENOUS | Status: DC | PRN

## 2022-06-11 SURGICAL SUPPLY — 43 items
BAG COUNTER SPONGE SURGICOUNT (BAG) IMPLANT
BAG SPNG CNTER NS LX DISP (BAG)
BIT DRILL 3.2XOCPTL (BIT) ×2 IMPLANT
BIT DRL 3.2XOCPTL (BIT) ×1
BLADE SAW RECIP 87.9 MT (BLADE) ×2 IMPLANT
BLADE SURG 21 STRL SS (BLADE) ×2 IMPLANT
BNDG CMPR 5X6 CHSV STRCH STRL (GAUZE/BANDAGES/DRESSINGS)
BNDG COHESIVE 6X5 TAN ST LF (GAUZE/BANDAGES/DRESSINGS) IMPLANT
CANISTER WOUND CARE 500ML ATS (WOUND CARE) ×2 IMPLANT
COVER SURGICAL LIGHT HANDLE (MISCELLANEOUS) ×2 IMPLANT
CUFF TOURN SGL QUICK 34 (TOURNIQUET CUFF) ×1
CUFF TRNQT CYL 34X4.125X (TOURNIQUET CUFF) ×2 IMPLANT
DRAPE DERMATAC (DRAPES) IMPLANT
DRAPE INCISE IOBAN 66X45 STRL (DRAPES) ×2 IMPLANT
DRAPE U-SHAPE 47X51 STRL (DRAPES) ×2 IMPLANT
DRESSING PREVENA PLUS CUSTOM (GAUZE/BANDAGES/DRESSINGS) ×2 IMPLANT
DRILL BIT (BIT) ×1
DRSG PREVENA PLUS CUSTOM (GAUZE/BANDAGES/DRESSINGS) ×1
DURAPREP 26ML APPLICATOR (WOUND CARE) ×2 IMPLANT
ELECT REM PT RETURN 9FT ADLT (ELECTROSURGICAL) ×1
ELECTRODE REM PT RTRN 9FT ADLT (ELECTROSURGICAL) ×2 IMPLANT
GLOVE BIOGEL PI IND STRL 9 (GLOVE) ×2 IMPLANT
GLOVE SURG ORTHO 9.0 STRL STRW (GLOVE) ×2 IMPLANT
GOWN STRL REUS W/ TWL XL LVL3 (GOWN DISPOSABLE) ×4 IMPLANT
GOWN STRL REUS W/TWL XL LVL3 (GOWN DISPOSABLE) ×2
GRAFT SKIN WND MICRO 38 (Tissue) IMPLANT
KIT BASIN OR (CUSTOM PROCEDURE TRAY) ×2 IMPLANT
KIT TURNOVER KIT B (KITS) ×2 IMPLANT
MANIFOLD NEPTUNE II (INSTRUMENTS) ×2 IMPLANT
NS IRRIG 1000ML POUR BTL (IV SOLUTION) ×2 IMPLANT
PACK ORTHO EXTREMITY (CUSTOM PROCEDURE TRAY) ×2 IMPLANT
PAD ARMBOARD 7.5X6 YLW CONV (MISCELLANEOUS) ×2 IMPLANT
PREVENA RESTOR ARTHOFORM 46X30 (CANNISTER) ×2 IMPLANT
SPONGE T-LAP 18X18 ~~LOC~~+RFID (SPONGE) IMPLANT
STAPLER VISISTAT 35W (STAPLE) IMPLANT
STOCKINETTE IMPERVIOUS LG (DRAPES) ×2 IMPLANT
SUT ETHILON 2 0 PSLX (SUTURE) IMPLANT
SUT SILK 2 0 (SUTURE) ×1
SUT SILK 2-0 18XBRD TIE 12 (SUTURE) ×2 IMPLANT
SUT VIC AB 1 CTX 27 (SUTURE) ×4 IMPLANT
TOWEL GREEN STERILE (TOWEL DISPOSABLE) ×2 IMPLANT
TUBE CONNECTING 12X1/4 (SUCTIONS) ×2 IMPLANT
YANKAUER SUCT BULB TIP NO VENT (SUCTIONS) ×2 IMPLANT

## 2022-06-11 NOTE — Progress Notes (Signed)
[facility] Progress Note   Subjective:    OR today for L BKA.   Objective Vitals:   06/11/22 1515 06/11/22 1522 06/11/22 1530 06/11/22 1545  BP: (!) 88/61 100/66 91/71 107/74  Pulse: 74 74 80   Resp: 19 20 (!) 22 (!) 21  Temp:    98.2 F (36.8 C)  TempSrc:      SpO2: 96% 96% 92% 97%  Weight:      Height:       Physical Exam General: Awake, alert, on RA, NAD Heart: RRR No murmur, rub  Lungs: Clear throughout;  Abdomen: Soft non-tender Extremities: L BKA  Dialysis Access: L AVF (+) B/T   Filed Weights   06/08/22 1953 06/09/22 1332 06/09/22 1721  Weight: 109.6 kg 113.8 kg 112.2 kg    Intake/Output Summary (Last 24 hours) at 06/11/2022 1606 Last data filed at 06/11/2022 1500 Gross per 24 hour  Intake 240 ml  Output 50 ml  Net 190 ml     Additional Objective Labs: Basic Metabolic Panel: Recent Labs  Lab 06/09/22 0238 06/10/22 0817 06/11/22 0358  NA 129* 129* 126*  K 3.3* 3.9 4.4  CL 87* 89* 87*  CO2 '28 26 25  '$ GLUCOSE 330* 309* 267*  BUN 50* 44* 71*  CREATININE 6.95* 6.08* 6.90*  CALCIUM 9.0 8.8* 9.1  PHOS  --  4.8*  --     Liver Function Tests: Recent Labs  Lab 06/10/22 0817 06/11/22 0358  AST 40 50*  ALT 24 31  ALKPHOS 98 172*  BILITOT 0.7 0.6  PROT 6.4* 6.9  ALBUMIN 1.7* 1.9*    No results for input(s): "LIPASE", "AMYLASE" in the last 168 hours. CBC: Recent Labs  Lab 06/09/22 0238 06/10/22 0817 06/11/22 0358  WBC 16.4* 13.9* 16.9*  NEUTROABS  --  12.3* 15.0*  HGB 7.5* 7.6* 8.9*  HCT 23.8* 24.2* 27.6*  MCV 86.9 87.7 86.3  PLT 304 271 285    Blood Culture    Component Value Date/Time   SDES BLOOD RIGHT FOREARM 06/09/2022 1028   SDES BLOOD RIGHT HAND 06/09/2022 1028   SPECREQUEST  06/09/2022 1028    BOTTLES DRAWN AEROBIC AND ANAEROBIC Blood Culture adequate volume   SPECREQUEST  06/09/2022 1028    BOTTLES DRAWN AEROBIC AND ANAEROBIC Blood Culture adequate volume   CULT  06/09/2022 1028    NO GROWTH 2  DAYS Performed at [facility] [address] [place], [place] [address]    CULT  06/09/2022 1028    NO GROWTH 2 DAYS Performed at [facility] [address] [place], [place] [address]    REPTSTATUS PENDING 06/09/2022 1028   REPTSTATUS PENDING 06/09/2022 1028    Cardiac Enzymes: Recent Labs  Lab 06/10/22 0817  CKTOTAL 128    CBG: Recent Labs  Lab 06/11/22 0730 06/11/22 1102 06/11/22 1229 06/11/22 1415 06/11/22 1512  GLUCAP 262* 227* 215* 178* 205*    Iron Studies: No results for input(s): "IRON", "TIBC", "TRANSFERRIN", "FERRITIN" in the last 72 hours. Lab Results  Component Value Date   INR 1.3 (H) 04/21/2021   INR 1.3 (H) 04/20/2021   INR 1.2 10/07/2020   Studies/Results: No results found.  Medications:  piperacillin-tazobactam (ZOSYN)  IV 2.25 g (06/11/22 0908)   [START ON 06/12/2022] vancomycin      (feeding supplement) PROSource Plus  30 mL Oral TID BM   atorvastatin  40 mg Oral Daily   calcitRIOL  1.25 mcg Oral Q M,W,F-HD  carvedilol  25 mg Oral BID WC   chlorhexidine       Chlorhexidine Gluconate Cloth  6 each Topical Q0600   feeding supplement (NEPRO CARB STEADY)  237 mL Oral BID BM   heparin  5,000 Units Subcutaneous Q8H   insulin aspart  0-5 Units Subcutaneous QHS   insulin aspart  0-6 Units Subcutaneous TID WC   irbesartan  150 mg Oral Daily   lactobacillus  1 g Oral TID WC   multivitamin  1 tablet Oral QHS   nutrition supplement (JUVEN)  1 packet Oral BID BM   oxyCODONE       sucroferric oxyhydroxide  1,000 mg Oral TID WC   torsemide  100 mg Oral Once per day on Sun Tue Thu Sat    Dialysis Orders: [facility] MWF 4h 56mn  400/800   108.5kg   2/2 bath  LFA AVF  Hep none - last HD 2/19, post wt 108kg   - rocaltrol 1.25 mcg po tiw - mircera 225 mcg IV  q 2wks, last 2/12, due 2/26 - last Hb 8.6 on 2/14  Home meds include - lipitor, coreg 25 bid, amaryl, irbesartan 150 qd, semaglutide, demadex '100mg'$  on non-hd days, velphoro '1000mg'$   ac tid, prns/ vits/ supps   Assessment/Plan: L foot  abscess/osteo - s/p L BKA 06/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 Off schedule d/t surgery. Next HD Saturday. HTN/ volume - BP low post op. Follow trends. May need to titrate meds. Needs  vol down w/ HD as well. Follow weights post amputation.  Anemia esrd - Hb 8.9 s/p 1 unit prbcs  2/22. Next ESA due 2/26.  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 Cont po vdra and velphoro as binder. Will add protein supplements DM2 per pmd  OLynnda ChildPA-C [facility] 06/11/2022,4:06 PM   Pt seen, examined and agree w A/P as above.  RKelly Splinter MD 06/11/2022, 4:27 PM

## 2022-06-11 NOTE — Inpatient Diabetes Management (Signed)
Inpatient Diabetes Program Recommendations  AACE/ADA: New Consensus Statement on Inpatient Glycemic Control   Target Ranges:  Prepandial:   less than 140 mg/dL      Peak postprandial:   less than 180 mg/dL (1-2 hours)      Critically ill patients:  140 - 180 mg/dL    Latest Reference Range & Units 06/10/22 07:28 06/10/22 11:26 06/10/22 16:45 06/10/22 21:03 06/11/22 07:30 06/11/22 11:02  Glucose-Capillary 70 - 99 mg/dL 258 (H) 320 (H) 315 (H) 297 (H) 262 (H) 227 (H)   Review of Glycemic Control  Diabetes history: DM2 Outpatient Diabetes medications: Amaryl 2 mg daily, Rybelsus 14 mg daily Current orders for Inpatient glycemic control: Novolog 0-6 units TID with meals, Novolog 0-5 units QHS  Inpatient Diabetes Program Recommendations:    Insulin: Please consider ordering Semglee 6 units Q24H. Once diet resumed, may want to consider ordering Novolog 2 units TID with meals for meal coverage if patient eats at least 50% of meals.  Thanks, Barnie Alderman, RN, MSN, [facility] Coordinator Inpatient Diabetes Program [phone] (Team Pager from 8am to [place])

## 2022-06-11 NOTE — Progress Notes (Signed)
Orthopedic Tech Progress Note Patient Details:  Scott Castillo 1960/12/13 [id number]  Called in order to [facility] for an [place]   Patient ID: Scott Castillo, male   DOB: 1960/06/14, 62 y.o.   MRN: [id number]  Scott Castillo 06/11/2022, 9:47 PM

## 2022-06-11 NOTE — Progress Notes (Signed)
Pt is NPO since 400, 06/11/2022

## 2022-06-11 NOTE — Transfer of Care (Signed)
Immediate Anesthesia Transfer of Care Note  Patient: Scott Castillo  Procedure(s) Performed: LEFT BELOW THE KNEE AMPUTATION (Left: Knee)  Patient Location: PACU  Anesthesia Type:MAC combined with regional for post-op pain  Level of Consciousness: drowsy and patient cooperative  Airway & Oxygen Therapy: Patient Spontanous Breathing and Patient connected to face mask oxygen  Post-op Assessment: Report given to RN, Post -op Vital signs reviewed and stable, and Patient moving all extremities X 4  Post vital signs: Reviewed and stable  Last Vitals:  Vitals Value Taken Time  BP    Temp    Pulse 73 06/11/22 1511  Resp    SpO2 92 % 06/11/22 1511  Vitals shown include unvalidated device data.  Last Pain:  Vitals:   06/11/22 1240  TempSrc:   PainSc: 4          Complications: No notable events documented.

## 2022-06-11 NOTE — Progress Notes (Signed)
Pharmacy Antibiotic Note  Scott Castillo is a 62 y.o. male admitted on 06/08/2022 with  diabetic foot infection . Pharmacy was consulted for vancomycin dosing for 7 days on admit. Now also consulted for Zosyn.   Patient has PMH of DM2, HTN, ESRD on MWF dialysis, severe diabetic neuropathy of lower extremities, and history of ulcer with abscess drained in December. Xray at [facility] worrisome for osteomyelitis with calcaneous fracture present. Vancomycin 2g IV at 1117 AM and cefepime 2g IV at 1415 given at [facility] on 06/08/22.  06/11/22:   last HD done 2/21, vanc given 2/21 post HD>>Patient currently in OR for Left BKA today.   No HD today, nephrology plans HD tomorrow AM 2/24.  Thus no vancomycin needed today and will change order to given vanc tomorrow post HD.    Plan: Change Vancomycin to 1gm IV TTSat,  to give dose  after HD tomorrow 06/12/22 (off usual HD schedule).  Monitor HD schedule for any need to adjust timing of Vanc doses. Continue Zosyn 2.25 gm IV q8h. Follow up culture data, clinical course and antibiotic plans. Follow post op for any antibiotic changes.  Height: 6' (182.9 cm) Weight: 112.2 kg (247 lb 5.7 oz) IBW/kg (Calculated) : 77.6  Temp (24hrs), Avg:97.4 F (36.3 C), Min:97.2 F (36.2 C), Max:98.1 F (36.7 C)  Recent Labs  Lab 06/09/22 0238 06/10/22 0817 06/11/22 0358  WBC 16.4* 13.9* 16.9*  CREATININE 6.95* 6.08* 6.90*     ESRD  Allergies  Allergen Reactions   Heparin Other (See Comments)    Per pt he has episodes where he can't speak. He has tolerated this med multiple times since this entered so unlikely it is true allergy   Pioglitazone Swelling    Site of swelling not recalled by patient   Adhesive [Tape] Other (See Comments)    Causes redness, pt prefers paper tape    Latex Rash    Redness, also    Antimicrobials this admission: Vancomycin 2/20 (2gm at [facility] 11am)>> Cefepime 2gm x 1 at [facility] on 2/20 Ceftriaxone x 1 on 2/21  Flagyl PO 2/21 x 2 doses  Zosyn  2/21>>  Dose adjustments this admission: n/a  Microbiology results: 2/23 MRSA PCR: negative  2/21 blood: NGTD 2/21 MRSA [id number]   Thank you for allowing pharmacy to be a part of this patient's care. Nicole Cella, RPh Clinical Pharmacist  06/11/2022 1:10 PM

## 2022-06-11 NOTE — Op Note (Signed)
06/11/2022  3:08 PM  PATIENT:  Scott Castillo    PRE-OPERATIVE DIAGNOSIS:  Abscess/Osteomyelitis Left Foot  POST-OPERATIVE DIAGNOSIS:  Same  PROCEDURE:  LEFT BELOW THE KNEE AMPUTATION Debridement abscess posterior left calf. Soft tissue sent for cultures. Application of vancomycin powder 1 g. Application Kerecis tissue graft 38 cm. Application of customizable and Arnell Sieving form wound VAC.   SURGEON:  Newt Minion, MD  PHYSICIAN ASSISTANT:None ANESTHESIA:   General  PREOPERATIVE INDICATIONS:  Chanoch Fechter Breeding is a  62 y.o. male with a diagnosis of Abscess/Osteomyelitis Left Foot who failed conservative measures and elected for surgical management.    The risks benefits and alternatives were discussed with the patient preoperatively including but not limited to the risks of infection, bleeding, nerve injury, cardiopulmonary complications, the need for revision surgery, among others, and the patient was willing to proceed.  OPERATIVE IMPLANTS: 1 g vancomycin powder.  Kerecis micro graft 38 cm.  '@ENCIMAGES'$ @  OPERATIVE FINDINGS: Patient had an abscess in the posterior aspect of the calf that was superficial to the gastrocnemius fascia.  The fascia was sent for cultures margins were clear.  OPERATIVE PROCEDURE: Patient is brought the operating room and underwent a regional anesthetic.  After adequate levels anesthesia were obtained patient's left lower extremity was prepped using DuraPrep draped into a sterile field a timeout was called.  A transverse incision was made 12 cm distal to the tibial tubercle this curved proximally and a large posterior flap was created.  Visualization patient had a large abscess that was in the fascia between the gastroc muscle and the skin.  The fascia and abscess were debrided irrigated and the tissue was filled with 1 g vancomycin powder.  The tissue was sent for cultures.  A reciprocating saw was used to resect the tibia just 1 cm proximal to the skin  incision and the fib was transected just 1 cm proximal to the tibia incision.  The tibia was beveled anteriorly.  The sciatic nerve was pulled cut and allowed to retract.  The vascular bundles were suture-ligated with 2-0 silk x 2.  There was bleeding due to the calcification of the arteries.  Electrocautery was used for hemostasis the wound was irrigated further with normal saline Kerecis micro powder was applied.  The deep and superficial fascial layers were closed using #1 Vicryl the skin was closed using staples.  The customizable and Arnell Sieving form wound VAC was applied this had a good suction fit this was wrapped with the stump shrinker and limb protector.  Patient was taken the PACU in stable condition.   DISCHARGE PLANNING:  Antibiotic duration: 24-hour IV antibiotics  Weightbearing: Nonweightbearing on the left  Pain medication: Opioid pathway  Dressing care/ Wound VAC: Continue wound VAC for a week  Ambulatory devices: Walker  Discharge to: Discharge planning based on therapy recommendations either inpatient or outpatient rehab.  Follow-up: In the office 1 week post operative.

## 2022-06-11 NOTE — Anesthesia Preprocedure Evaluation (Signed)
Anesthesia Evaluation  Patient identified by MRN, date of birth, ID band Patient awake    Reviewed: Allergy & Precautions, NPO status , Patient's Chart, lab work & pertinent test results  History of Anesthesia Complications Negative for: history of anesthetic complications  Airway Mallampati: II  TM Distance: >3 FB     Dental no notable dental hx. (+) Dental Advisory Given   Pulmonary neg pulmonary ROS   Pulmonary exam normal        Cardiovascular hypertension, Pt. on medications and Pt. on home beta blockers Normal cardiovascular exam     Neuro/Psych negative neurological ROS     GI/Hepatic   Endo/Other  diabetes    Renal/GU      Musculoskeletal   Abdominal   Peds  Hematology   Anesthesia Other Findings   Reproductive/Obstetrics                             Anesthesia Physical Anesthesia Plan  ASA: 3  Anesthesia Plan: Regional and MAC   Post-op Pain Management: Minimal or no pain anticipated   Induction:   PONV Risk Score and Plan: 1  Airway Management Planned: Simple Face Mask  Additional Equipment:   Intra-op Plan:   Post-operative Plan:   Informed Consent: I have reviewed the patients History and Physical, chart, labs and discussed the procedure including the risks, benefits and alternatives for the proposed anesthesia with the patient or authorized representative who has indicated his/her understanding and acceptance.     Dental advisory given  Plan Discussed with: Anesthesiologist  Anesthesia Plan Comments:        Anesthesia Quick Evaluation

## 2022-06-11 NOTE — Anesthesia Procedure Notes (Signed)
Procedure Name: MAC Date/Time: 06/11/2022 2:30 PM  Performed by: Rande Brunt, CRNAPre-anesthesia Checklist: Emergency Drugs available, Patient identified, Suction available and Patient being monitored Patient Re-evaluated:Patient Re-evaluated prior to induction Oxygen Delivery Method: Simple face mask Preoxygenation: Pre-oxygenation with 100% oxygen Induction Type: IV induction Placement Confirmation: positive ETCO2 and CO2 detector

## 2022-06-11 NOTE — Plan of Care (Signed)
  Problem: Education: Goal: Knowledge of General Education information will improve Description: Including pain rating scale, medication(s)/side effects and non-pharmacologic comfort measures Outcome: Progressing   Problem: Health Behavior/Discharge Planning: Goal: Ability to manage health-related needs will improve Outcome: Progressing   Problem: Clinical Measurements: Goal: Ability to maintain clinical measurements within normal limits will improve Outcome: Progressing Goal: Will remain free from infection Outcome: Progressing Goal: Diagnostic test results will improve Outcome: Progressing Goal: Respiratory complications will improve Outcome: Progressing Goal: Cardiovascular complication will be avoided Outcome: Progressing   Problem: Activity: Goal: Risk for activity intolerance will decrease Outcome: Progressing   Problem: Nutrition: Goal: Adequate nutrition will be maintained Outcome: Progressing   Problem: Coping: Goal: Level of anxiety will decrease Outcome: Progressing   Problem: Elimination: Goal: Will not experience complications related to bowel motility Outcome: Progressing Goal: Will not experience complications related to urinary retention Outcome: Progressing   Problem: Skin Integrity: Goal: Risk for impaired skin integrity will decrease Outcome: Progressing   Problem: Education: Goal: Knowledge of disease and its progression will improve Outcome: Progressing Goal: Individualized Educational Video(s) Outcome: Progressing   Problem: Fluid Volume: Goal: Compliance with measures to maintain balanced fluid volume will improve Outcome: Progressing   Problem: Nutritional: Goal: Ability to make healthy dietary choices will improve Outcome: Progressing   Problem: Clinical Measurements: Goal: Complications related to the disease process, condition or treatment will be avoided or minimized Outcome: Progressing   Problem: Education: Goal: Ability to  describe self-care measures that may prevent or decrease complications (Diabetes Survival Skills Education) will improve Outcome: Progressing Goal: Individualized Educational Video(s) Outcome: Progressing   Problem: Coping: Goal: Ability to adjust to condition or change in health will improve Outcome: Progressing   Problem: Health Behavior/Discharge Planning: Goal: Ability to identify and utilize available resources and services will improve Outcome: Progressing Goal: Ability to manage health-related needs will improve Outcome: Progressing   Problem: Metabolic: Goal: Ability to maintain appropriate glucose levels will improve Outcome: Progressing   Problem: Nutritional: Goal: Maintenance of adequate nutrition will improve Outcome: Progressing Goal: Progress toward achieving an optimal weight will improve Outcome: Progressing   Problem: Skin Integrity: Goal: Risk for impaired skin integrity will decrease Outcome: Progressing   Problem: Tissue Perfusion: Goal: Adequacy of tissue perfusion will improve Outcome: Progressing

## 2022-06-11 NOTE — TOC Initial Note (Signed)
Transition of Care [facility]) - Initial/Assessment Note    Patient Details  Name: Scott Castillo MRN: [id number] Date of Birth: [birth year]  Transition [facility]) CM/SW Contact:    Tom-Johnson, Renea Ee, RN Phone Number: 06/11/2022, 12:59 PM  Clinical Narrative:                  CM spoke with patient at bedside about needs for post hospital transition. Admitted for Diabetic Infection of Left foot 2/2 Abscess/Osteomyelitis. On IV abx. Has scheduled Lt BKA today by Dr. Sharol Given.  Patient is from home with wife, has a daughter, mother and step father whom are supportive with care. Independent with care prior to admission. Currently on Disability, goes to outpatient Dialysis on MWF schedule and step father and friend transports.  Has a walker, cane and bsc. Uses his friend's w/c to and from Dialysis. PCP is Dough, Jaymes Graff, MD and uses [facility] on [address]  Awaiting recommendation post surgery for disposition. CM will continue to follow with needs.           Barriers to Discharge: Continued Medical Work up   Patient Goals and CMS Choice Patient states their goals for this hospitalization and ongoing recovery are:: To return home CMS Medicare.gov Compare Post Acute Care list provided to:: Patient        Expected Discharge Plan and Services   Discharge Planning Services: CM Consult   Living arrangements for the past 2 months: Single Family Home                                      Prior Living Arrangements/Services Living arrangements for the past 2 months: Single Family Home Lives with:: Spouse Patient language and need for interpreter reviewed:: Yes Do you feel safe going back to the place where you live?: Yes      Need for Family Participation in Patient Care: Yes (Comment) Care giver support system in place?: Yes (comment)   Criminal Activity/Legal Involvement Pertinent to Current Situation/Hospitalization: No - Comment as needed  Activities of  Daily Living Home Assistive Devices/Equipment: Walker (specify type), Eyeglasses ADL Screening (condition at time of admission) Patient's cognitive ability adequate to safely complete daily activities?: Yes Is the patient deaf or have difficulty hearing?: No Does the patient have difficulty seeing, even when wearing glasses/contacts?: No Does the patient have difficulty concentrating, remembering, or making decisions?: No Patient able to express need for assistance with ADLs?: Yes Does the patient have difficulty dressing or bathing?: No Independently performs ADLs?: No Communication: Independent Dressing (OT): Independent Grooming: Independent Feeding: Independent Bathing: Needs assistance Is this a change from baseline?: Pre-admission baseline Toileting: Needs assistance Is this a change from baseline?: Pre-admission baseline In/Out Bed: Needs assistance Is this a change from baseline?: Pre-admission baseline Walks in Home: Needs assistance Is this a change from baseline?: Pre-admission baseline Does the patient have difficulty walking or climbing stairs?: Yes Weakness of Legs: Both Weakness of Arms/Hands: None  Permission Sought/Granted Permission sought to share information with : Case Manager, Family Supports Permission granted to share information with : Yes, Verbal Permission Granted              Emotional Assessment Appearance:: Appears stated age Attitude/Demeanor/Rapport: Engaged, Gracious Affect (typically observed): Accepting, Appropriate, Calm, Hopeful, Pleasant Orientation: : Oriented to Self, Oriented to Place, Oriented to  Time, Oriented to Situation Alcohol / Substance Use: Not  Applicable Psych Involvement: No (comment)  Admission diagnosis:  Diabetic infection of left foot ([place]) [id number], L08.9] Patient Active Problem List   Diagnosis Date Noted   Subacute osteomyelitis of left foot ([place]) 06/10/2022   Pressure injury of skin 06/09/2022   Diabetic  infection of left foot ([place]) 06/09/2022   HTN (hypertension) 06/09/2022   Right hip pain 06/09/2022   Expressive aphasia 04/20/2021   Obesity (BMI 30-39.9) 10/08/2020   Hyperkalemia, diminished renal excretion 08/15/2020   ESRD (end stage renal disease) ([place]) 09/04/2019   Type 2 diabetes mellitus with ESRD (end-stage renal disease) ([place]) 07/03/2016   Hypertensive urgency 07/03/2016   Hyperlipidemia 07/03/2016   Anemia of chronic disease 07/03/2016   PCP:  Algis Greenhouse, MD Pharmacy:   [facility] [address] [place] [address] Phone: [phone] Fax: [phone]     Social Determinants of Health (SDOH) Social History: SDOH Screenings   Food Insecurity: No Food Insecurity (06/08/2022)  Housing: Low Risk  (06/08/2022)  Transportation Needs: No Transportation Needs (06/08/2022)  Utilities: Not At Risk (06/08/2022)  Tobacco Use: Low Risk  (06/11/2022)   SDOH Interventions: Transportation Interventions: Intervention Not Indicated, Inpatient TOC, Patient Resources (Friends/Family)   Readmission Risk Interventions    06/19/2020    3:04 PM  Readmission Risk Prevention Plan  Transportation Screening Complete  PCP or Specialist Appt within 3-5 Days Complete  HRI or [place] Complete  Social Work Consult for [place] Planning/Counseling Complete  Palliative Care Screening Complete  Medication Review Press photographer) Referral to Pharmacy

## 2022-06-11 NOTE — Anesthesia Procedure Notes (Addendum)
Anesthesia Regional Block: Popliteal block   Pre-Anesthetic Checklist: , timeout performed,  Correct Patient, Correct Site, Correct Laterality,  Correct Procedure, Correct Position, site marked,  Risks and benefits discussed,  Surgical consent,  Pre-op evaluation,  At surgeon's request and post-op pain management  Laterality: Left  Prep: chloraprep       Needles:  Injection technique: Single-shot  Needle Type: Echogenic Stimulator Needle          Additional Needles:   Procedures:,,,, ultrasound used (permanent image in chart),,    Narrative:  Start time: 06/11/2022 12:41 PM End time: 06/11/2022 12:51 PM Injection made incrementally with aspirations every 5 mL.  Performed by: Personally  Anesthesiologist: Duane Boston, MD  Additional Notes: A functioning IV was confirmed and monitors were applied.  Sterile prep and drape, hand hygiene and sterile gloves were used.  Negative aspiration and test dose prior to incremental administration of local anesthetic. The patient tolerated the procedure well.Ultrasound  guidance: relevant anatomy identified, needle position confirmed, local anesthetic spread visualized around nerve(s), vascular puncture avoided.  Image printed for medical record. ACB supplementation.

## 2022-06-11 NOTE — Progress Notes (Signed)
Patient returned to floor from OR where he had a left BKA. VSS. Alert and oriented x 4. Family at bedside.

## 2022-06-11 NOTE — Progress Notes (Signed)
[facility] Progress Note Patient: Scott Castillo [id number] DOB: [birth year] DOA: 06/08/2022  DOS: the patient was seen and examined on 06/11/2022  Brief hospital course: PMH of type II DM, HTN, ESRD on HD MWF, neuropathy, nephropathy, retinopathy, nonhealing ulcers on the leg seen by podiatry. Present to the hospital with complaints of worsening wound and drainage ongoing since December 2023.  Was initially sent to current of hospital ER from podiatry office-[facility]. 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  Orthopedic consulted.  Most likely will require surgical management tentatively on Friday.  Nephrology also following for HD needs. 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 Infection from gas-forming organism. No blood cultures performed before initiation of antibiotic here in the hospital.  Follow-up on blood cultures. Initially started on Vanco Flagyl and Rocephin. I changed to [place]. ABI unable to compress the vasculature. Orthopedic consulted.  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 Culture sent.  Monitor.   Right hip pain. X-ray unremarkable. Monitor.   ESRD on HD MWF. Nephrology consulted. Undergoing HD in the hospital.   HTN. Blood pressure stable. Continue home regimen.   Type 2 diabetes mellitus with ESRD and neuropathy with nonhealing wound, uncontrolled with hyperglycemia. Currently on sliding scale insulin.  Obesity Body mass index is 33.55 kg/m.  Placing the pt at higher risk of poor outcomes.   Subjective: No nausea no vomiting no fever no chills.  Wife and daughter at bedside.  Physical Exam: General: in Mild distress, No Rash Cardiovascular: S1 and S2 Present, No Murmur Respiratory: Good respiratory effort, Bilateral Air entry present. No Crackles, No wheezes Abdomen: Bowel Sound present, No  tenderness Extremities: No edema now with left BKA Neuro: Alert and oriented x3, no new focal deficit  Data Reviewed: I have Reviewed nursing notes, Vitals, and Lab results. Since last encounter, pertinent lab results CBC and BMP   . I have ordered test including CBC and BMP  .   Disposition: Status is: Inpatient Remains inpatient appropriate because: Monitor postop recovery.  SCD's Start: 06/11/22 1706 heparin injection 5,000 Units Start: 06/09/22 0600   Family Communication: Wife and daughter at bedside. Level of care: Med-Surg  Vitals:   06/11/22 1522 06/11/22 1530 06/11/22 1545 06/11/22 1602  BP: 100/66 91/71 107/74 101/67  Pulse: 74 80  77  Resp: 20 (!) 22 (!) 21 20  Temp:   98.2 F (36.8 C)   TempSrc:      SpO2: 96% 92% 97% 98%  Weight:      Height:         Author: Berle Mull, MD 06/11/2022 7:25 PM  Please look on [email or web address] to find out who is on call.

## 2022-06-11 NOTE — Anesthesia Postprocedure Evaluation (Signed)
Anesthesia Post Note  Patient: Scott Castillo  Procedure(s) Performed: LEFT BELOW THE KNEE AMPUTATION (Left: Knee)     Patient location during evaluation: PACU Anesthesia Type: Regional Level of consciousness: awake and alert Pain management: pain level controlled Vital Signs Assessment: post-procedure vital signs reviewed and stable Respiratory status: spontaneous breathing, nonlabored ventilation and respiratory function stable Cardiovascular status: blood pressure returned to baseline Postop Assessment: no apparent nausea or vomiting Anesthetic complications: no   No notable events documented.  Last Vitals:  Vitals:   06/11/22 1545 06/11/22 1602  BP: 107/74 101/67  Pulse:  77  Resp: (!) 21 20  Temp: 36.8 C   SpO2: 97% 98%    Last Pain:  Vitals:   06/11/22 1240  TempSrc:   PainSc: [place]

## 2022-06-11 NOTE — Progress Notes (Signed)
Pt's chair time at [facility] is 9:45 on MWF.   Scott Castillo Renal Navigator [phone]

## 2022-06-11 NOTE — Progress Notes (Signed)
Patient off floor to the OR.

## 2022-06-11 NOTE — Interval H&P Note (Signed)
History and Physical Interval Note:  06/11/2022 10:50 AM  Scott Castillo  has presented today for surgery, with the diagnosis of Abscess/Osteomyelitis Left Foot.  The various methods of treatment have been discussed with the patient and family. After consideration of risks, benefits and other options for treatment, the patient has consented to  Procedure(s): LEFT BELOW KNEE AMPUTATION (Left) as a surgical intervention.  The patient's history has been reviewed, patient examined, no change in status, stable for surgery.  I have reviewed the patient's chart and labs.  Questions were answered to the patient's satisfaction.     Newt Minion

## 2022-06-12 DIAGNOSIS — L089 Local infection of the skin and subcutaneous tissue, unspecified: Secondary | ICD-10-CM | POA: Diagnosis not present

## 2022-06-12 DIAGNOSIS — E11628 Type 2 diabetes mellitus with other skin complications: Secondary | ICD-10-CM | POA: Diagnosis not present

## 2022-06-12 LAB — CBC WITH DIFFERENTIAL/PLATELET
Abs Immature Granulocytes: 0.23 10*3/uL — ABNORMAL HIGH (ref 0.00–0.07)
Basophils Absolute: 0.1 10*3/uL (ref 0.0–0.1)
Basophils Relative: 0 %
Eosinophils Absolute: 0.2 10*3/uL (ref 0.0–0.5)
Eosinophils Relative: 1 %
HCT: 26 % — ABNORMAL LOW (ref 39.0–52.0)
Hemoglobin: 8 g/dL — ABNORMAL LOW (ref 13.0–17.0)
Immature Granulocytes: 1 %
Lymphocytes Relative: 5 %
Lymphs Abs: 0.8 10*3/uL (ref 0.7–4.0)
MCH: 27.2 pg (ref 26.0–34.0)
MCHC: 30.8 g/dL (ref 30.0–36.0)
MCV: 88.4 fL (ref 80.0–100.0)
Monocytes Absolute: 0.9 10*3/uL (ref 0.1–1.0)
Monocytes Relative: 5 %
Neutro Abs: 14.3 10*3/uL — ABNORMAL HIGH (ref 1.7–7.7)
Neutrophils Relative %: 88 %
Platelets: 273 10*3/uL (ref 150–400)
RBC: 2.94 MIL/uL — ABNORMAL LOW (ref 4.22–5.81)
RDW: 17.2 % — ABNORMAL HIGH (ref 11.5–15.5)
WBC: 16.4 10*3/uL — ABNORMAL HIGH (ref 4.0–10.5)
nRBC: 0 % (ref 0.0–0.2)

## 2022-06-12 LAB — COMPREHENSIVE METABOLIC PANEL
ALT: 24 U/L (ref 0–44)
AST: 37 U/L (ref 15–41)
Albumin: 1.6 g/dL — ABNORMAL LOW (ref 3.5–5.0)
Alkaline Phosphatase: 118 U/L (ref 38–126)
Anion gap: 13 (ref 5–15)
BUN: 85 mg/dL — ABNORMAL HIGH (ref 6–20)
CO2: 23 mmol/L (ref 22–32)
Calcium: 8.6 mg/dL — ABNORMAL LOW (ref 8.9–10.3)
Chloride: 88 mmol/L — ABNORMAL LOW (ref 98–111)
Creatinine, Ser: 7.72 mg/dL — ABNORMAL HIGH (ref 0.61–1.24)
GFR, Estimated: 7 mL/min — ABNORMAL LOW (ref >60)
Glucose, Bld: 255 mg/dL — ABNORMAL HIGH (ref 70–99)
Potassium: 4.4 mmol/L (ref 3.5–5.1)
Sodium: 124 mmol/L — ABNORMAL LOW (ref 135–145)
Total Bilirubin: 0.6 mg/dL (ref 0.3–1.2)
Total Protein: 5.9 g/dL — ABNORMAL LOW (ref 6.5–8.1)

## 2022-06-12 LAB — GLUCOSE, CAPILLARY
Glucose-Capillary: 193 mg/dL — ABNORMAL HIGH (ref 70–99)
Glucose-Capillary: 177 mg/dL — ABNORMAL HIGH (ref 70–99)
Glucose-Capillary: 257 mg/dL — ABNORMAL HIGH (ref 70–99)
Glucose-Capillary: 281 mg/dL — ABNORMAL HIGH (ref 70–99)

## 2022-06-12 LAB — MAGNESIUM: Magnesium: 2.1 mg/dL (ref 1.7–2.4)

## 2022-06-12 MED ORDER — PIPERACILLIN-TAZOBACTAM IN DEX 2-0.25 GM/50ML IV SOLN
2.2500 g | Freq: Three times a day (TID) | INTRAVENOUS | Status: DC
  Administered 2022-06-12 – 2022-06-13 (×4): 2.25 g via INTRAVENOUS
  Filled 2022-06-12 (×5): qty 50

## 2022-06-12 MED ORDER — INSULIN GLARGINE-YFGN 100 UNIT/ML ~~LOC~~ SOLN
6.0000 [IU] | Freq: Every day | SUBCUTANEOUS | Status: DC
  Administered 2022-06-12 – 2022-06-15 (×4): 6 [IU] via SUBCUTANEOUS
  Filled 2022-06-12 (×4): qty 0.06

## 2022-06-12 NOTE — Progress Notes (Signed)
PT Cancellation Note  Patient Details Name: Scott Castillo MRN: [id number] DOB: 1960-06-08   Cancelled Treatment:    Reason Eval/Treat Not Completed: Patient at procedure or test/unavailable. Pt currently off unit for HD. Will check back as schedule allows to initiate PT evaluation.    Thelma Comp 06/12/2022, 10:43 AM  Rolinda Roan, PT, DPT Acute Rehabilitation Services Secure Chat Preferred Office: [phone]

## 2022-06-12 NOTE — Progress Notes (Signed)
[facility] Progress Note   Subjective:    Seen in [place]. UF goal 3L. Got pain meds and is comfortable this am. No chest pain, dyspnea.   Objective Vitals:   06/11/22 2127 06/12/22 0510 06/12/22 0827 06/12/22 0841  BP: 104/69 109/77 112/77 106/74  Pulse: 67 71 80 63  Resp: 19 19 (!) 26 20  Temp: (!) 97.5 F (36.4 C) (!) 97.5 F (36.4 C) 97.6 F (36.4 C)   TempSrc: Oral Oral Axillary   SpO2: 96% 94% 92% 92%  Weight:   117.9 kg   Height:       Physical Exam General: Awake, alert, on RA, NAD Heart: RRR No murmur, rub  Lungs: Clear throughout;  Abdomen: Soft non-tender Extremities: L BKA  Dialysis Access: L AVF (+) B/T   Filed Weights   06/09/22 1332 06/09/22 1721 06/12/22 0827  Weight: 113.8 kg 112.2 kg 117.9 kg    Intake/Output Summary (Last 24 hours) at 06/12/2022 0919 Last data filed at 06/12/2022 0600 Gross per 24 hour  Intake 1300.1 ml  Output 400 ml  Net 900.1 ml     Additional Objective Labs: Basic Metabolic Panel: Recent Labs  Lab 06/10/22 0817 06/11/22 0358 06/12/22 0050  NA 129* 126* 124*  K 3.9 4.4 4.4  CL 89* 87* 88*  CO2 '26 25 23  '$ GLUCOSE 309* 267* 255*  BUN 44* 71* 85*  CREATININE 6.08* 6.90* 7.72*  CALCIUM 8.8* 9.1 8.6*  PHOS 4.8*  --   --     Liver Function Tests: Recent Labs  Lab 06/10/22 0817 06/11/22 0358 06/12/22 0050  AST 40 50* 37  ALT '24 31 24  '$ ALKPHOS 98 172* 118  BILITOT 0.7 0.6 0.6  PROT 6.4* 6.9 5.9*  ALBUMIN 1.7* 1.9* 1.6*    No results for input(s): "LIPASE", "AMYLASE" in the last 168 hours. CBC: Recent Labs  Lab 06/09/22 0238 06/10/22 0817 06/11/22 0358 06/12/22 0050  WBC 16.4* 13.9* 16.9* 16.4*  NEUTROABS  --  12.3* 15.0* 14.3*  HGB 7.5* 7.6* 8.9* 8.0*  HCT 23.8* 24.2* 27.6* 26.0*  MCV 86.9 87.7 86.3 88.4  PLT 304 271 285 273    Blood Culture    Component Value Date/Time   SDES TISSUE 06/11/2022 1449   SPECREQUEST LEFT BKA 06/11/2022 1449   CULT PENDING 06/11/2022 1449    REPTSTATUS PENDING 06/11/2022 1449    Cardiac Enzymes: Recent Labs  Lab 06/10/22 0817  CKTOTAL 128    CBG: Recent Labs  Lab 06/11/22 1415 06/11/22 1512 06/11/22 1718 06/11/22 2127 06/12/22 0719  GLUCAP 178* 205* 181* 261* 193*    Iron Studies: No results for input(s): "IRON", "TIBC", "TRANSFERRIN", "FERRITIN" in the last 72 hours. Lab Results  Component Value Date   INR 1.3 (H) 04/21/2021   INR 1.3 (H) 04/20/2021   INR 1.2 10/07/2020   Studies/Results: No results found.  Medications:  sodium chloride 75 mL/hr at 06/12/22 0224   magnesium sulfate bolus IVPB     piperacillin-tazobactam (ZOSYN)  IV 2.25 g (06/12/22 0229)    (feeding supplement) PROSource Plus  30 mL Oral TID BM   vitamin C  1,000 mg Oral Daily   atorvastatin  40 mg Oral Daily   calcitRIOL  1.25 mcg Oral Q M,W,F-HD   carvedilol  25 mg Oral BID WC   Chlorhexidine Gluconate Cloth  6 each Topical Q0600   docusate sodium  100 mg Oral Daily   feeding supplement (NEPRO CARB STEADY)  237 mL Oral BID  BM   heparin  5,000 Units Subcutaneous Q8H   insulin aspart  0-5 Units Subcutaneous QHS   insulin aspart  0-6 Units Subcutaneous TID WC   insulin glargine-yfgn  6 Units Subcutaneous Daily   irbesartan  150 mg Oral Daily   lactobacillus  1 g Oral TID WC   multivitamin  1 tablet Oral QHS   nutrition supplement (JUVEN)  1 packet Oral BID BM   pantoprazole  40 mg Oral Daily   sucroferric oxyhydroxide  1,000 mg Oral TID WC   torsemide  100 mg Oral Once per day on Sun Tue Thu Sat   zinc sulfate  220 mg Oral Daily    Dialysis Orders: [facility] MWF 4h 38mn  400/800   108.5kg   2/2 bath  LFA AVF  Hep none - last HD 2/19, post wt 108kg   - rocaltrol 1.25 mcg po tiw - mircera 225 mcg IV  q 2wks, last 2/12, due 2/26 - last Hb 8.6 on 2/14  Home meds include - lipitor, coreg 25 bid, amaryl, irbesartan 150 qd, semaglutide, demadex '100mg'$  on non-hd days, velphoro '1000mg'$  ac tid, prns/ vits/ supps   Assessment/Plan: L  foot  abscess/osteo - s/p L BKA 06/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 Off schedule d/t surgery. HD today then back on MWF schedule.  HTN/ volume - BP low post op. Follow trends. May need to titrate meds. Needs  vol down w/ HD as well. Follow weights post amputation.  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 Next ESA due 2/26.  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 Cont po vdra and velphoro as binder. Will add protein supplements DM2 per pmd  OLynnda ChildPA-C [facility] 06/12/2022,9:19 AM

## 2022-06-12 NOTE — Progress Notes (Signed)
[facility] Progress Note Patient: Scott Castillo [id number] DOB: 31-May-1960 DOA: 06/08/2022  DOS: the patient was seen and examined on 06/12/2022  Brief hospital course: PMH of type II DM, HTN, ESRD on HD MWF, neuropathy, nephropathy, retinopathy, nonhealing ulcers on the leg seen by podiatry. Present to the hospital with complaints of worsening wound and drainage ongoing since December 2023.  Was initially sent to current of hospital ER from podiatry office-[facility]. 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  Orthopedic consulted.  Most likely will require surgical management tentatively on Friday.  Nephrology also following for HD needs. 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 Infection from gas-forming organism. No blood cultures performed before initiation of antibiotic here in the hospital.  Follow-up on blood cultures. Initially started on Vanco Flagyl and Rocephin. I changed to [place].  Currently on Zosyn.  Follow-up on cultures. ABI unable to compress the vasculature. Orthopedic consulted.  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 Culture sent.  Monitor.  Concerns with heparin. Patient reports that he has some concern with resuming heparin as it causes his blood to obtain too much which leads to poor thought process, delirium. On chart review patient had multiple admissions in 2022 secondary to delirium/encephalopathy. This was thought to be secondary to dialysis disequilibrium syndrome. Patient reduced insight that it was to happen and that caused him to have confusion and since then has not been receiving heparin as this dialysis. Multiple CT scans as well as MRIs during that admission did not show any evidence of bleeding. 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 Recommended patient to reconsider his refusal for heparin as his risk for DVT is significantly higher.  No other options available for DVT prophylaxis as all will increase thinness of the blood!   Right hip pain. X-ray unremarkable. Monitor.   ESRD on HD MWF. Nephrology consulted. Undergoing HD in the hospital.   HTN. Blood pressure stable. Continue home regimen.   Type 2 diabetes mellitus with ESRD and neuropathy with nonhealing wound, uncontrolled with hyperglycemia. Currently on sliding scale insulin.   Obesity Body mass index is 35.25 kg/m.  Placing the pt at higher risk of poor outcomes.   Subjective: No nausea no vomiting or no fever no chills.  Patient was visibly anxious after noticing that he did receive heparin during his January 2023 admission as well.  No nausea no vomiting no fever no chills.  Reports some numbness after receiving this information in bilateral upper extremity  Physical Exam: General: in Mild distress, No Rash Cardiovascular: S1 and S2 Present, No Murmur Respiratory: Good respiratory effort, Bilateral Air entry present. No Crackles, No wheezes Abdomen: Bowel Sound present, No tenderness Extremities: No edema Neuro: Alert and oriented x3, no new focal deficit, no significant difference in sensation at the time of my evaluation.    Data Reviewed: I have Reviewed nursing notes, Vitals, and Lab results. Since last encounter, pertinent lab results CBC and BMP   . I have ordered test including CBC and BMP  .   Disposition: Status is: Inpatient Remains inpatient appropriate because: Need IV antibiotics  SCD's Start: 06/11/22 1706 heparin injection 5,000 Units Start: 06/09/22 0600   Family Communication: Wife at bedside Level of care: Med-Surg  Vitals:   06/12/22 1130 06/12/22 1147  06/12/22 1326 06/12/22 1623  BP:  111/80 118/75 110/68  Pulse:  99 88 86  Resp: '13 17  18  '$ Temp:    98.2 F (36.8 C) 98 F (36.7 C)  TempSrc:   Oral Oral  SpO2: 96% 99% 91% 93%  Weight:      Height:         Author: Berle Mull, MD 06/12/2022 6:07 PM  Please look on [email or web address] to find out who is on call.

## 2022-06-12 NOTE — Progress Notes (Signed)
OT Cancellation Note  Patient Details Name: Scott Castillo MRN: [id number] DOB: 08-01-1960   Cancelled Treatment:    Reason Eval/Treat Not Completed: Patient at procedure or test/ unavailable (HD. Will return as schedule allows.)  [place], OTR/L Acute Rehab Office: [phone] 06/12/2022, 8:38 AM

## 2022-06-12 NOTE — Progress Notes (Signed)
Patient returned to floor from dialysis. Alert and Vss.

## 2022-06-12 NOTE — Progress Notes (Signed)
Received patient in bed to unit.  Alert and oriented.  Informed consent signed and in chart.   TX duration:3.0  Patient tolerated well.  Transported back to the room  Alert, without acute distress.  Hand-off given to patient's nurse.   Access used: AVF Access issues: none  Total UF removed: 3.0 Medication(s) given: Oxy ir '10mg'$  Post HD VS: 11/80,97.9,86,99%17, Post HD weight: 115.2kg   [facility]

## 2022-06-12 NOTE — Progress Notes (Signed)
Patient ID: Scott Castillo, male   DOB: December 17, 1960, 62 y.o.   MRN: [id number] Patient is seen in hemodialysis.  He is status post left below-knee amputation.  Of note patient did have an abscess that extended posterior to the gastroc fascia.  This tissue was sent for cultures.  There is no drainage in the wound VAC canister.  Patient will need to continue antibiotics based on culture sensitivities.

## 2022-06-13 DIAGNOSIS — E11628 Type 2 diabetes mellitus with other skin complications: Secondary | ICD-10-CM | POA: Diagnosis not present

## 2022-06-13 DIAGNOSIS — L089 Local infection of the skin and subcutaneous tissue, unspecified: Secondary | ICD-10-CM | POA: Diagnosis not present

## 2022-06-13 LAB — MAGNESIUM: Magnesium: 1.9 mg/dL (ref 1.7–2.4)

## 2022-06-13 LAB — CBC
HCT: 30 % — ABNORMAL LOW (ref 39.0–52.0)
Hemoglobin: 9.2 g/dL — ABNORMAL LOW (ref 13.0–17.0)
MCH: 27.2 pg (ref 26.0–34.0)
MCHC: 30.7 g/dL (ref 30.0–36.0)
MCV: 88.8 fL (ref 80.0–100.0)
Platelets: 311 10*3/uL (ref 150–400)
RBC: 3.38 MIL/uL — ABNORMAL LOW (ref 4.22–5.81)
RDW: 17.5 % — ABNORMAL HIGH (ref 11.5–15.5)
WBC: 22.2 10*3/uL — ABNORMAL HIGH (ref 4.0–10.5)
nRBC: 0 % (ref 0.0–0.2)

## 2022-06-13 LAB — GLUCOSE, CAPILLARY
Glucose-Capillary: 216 mg/dL — ABNORMAL HIGH (ref 70–99)
Glucose-Capillary: 263 mg/dL — ABNORMAL HIGH (ref 70–99)
Glucose-Capillary: 305 mg/dL — ABNORMAL HIGH (ref 70–99)
Glucose-Capillary: 218 mg/dL — ABNORMAL HIGH (ref 70–99)

## 2022-06-13 LAB — RENAL FUNCTION PANEL
Albumin: 1.7 g/dL — ABNORMAL LOW (ref 3.5–5.0)
Anion gap: 14 (ref 5–15)
BUN: 68 mg/dL — ABNORMAL HIGH (ref 6–20)
CO2: 22 mmol/L (ref 22–32)
Calcium: 8.4 mg/dL — ABNORMAL LOW (ref 8.9–10.3)
Chloride: 86 mmol/L — ABNORMAL LOW (ref 98–111)
Creatinine, Ser: 6.48 mg/dL — ABNORMAL HIGH (ref 0.61–1.24)
GFR, Estimated: 9 mL/min — ABNORMAL LOW (ref >60)
Glucose, Bld: 290 mg/dL — ABNORMAL HIGH (ref 70–99)
Phosphorus: 5.3 mg/dL — ABNORMAL HIGH (ref 2.5–4.6)
Potassium: 4.2 mmol/L (ref 3.5–5.1)
Sodium: 122 mmol/L — ABNORMAL LOW (ref 135–145)

## 2022-06-13 MED ORDER — ALTEPLASE 2 MG IJ SOLR: 2.0000 mg | Freq: Once | INTRAMUSCULAR | Status: DC | PRN

## 2022-06-13 MED ORDER — LIDOCAINE-PRILOCAINE 2.5-2.5 % EX CREA: 1.0000 | TOPICAL_CREAM | CUTANEOUS | Status: DC | PRN

## 2022-06-13 MED ORDER — ALBUMIN HUMAN 25 % IV SOLN
INTRAVENOUS | Status: AC
  Filled 2022-06-13: qty 100

## 2022-06-13 MED ORDER — VANCOMYCIN HCL IN DEXTROSE 1-5 GM/200ML-% IV SOLN: 1000.0000 mg | INTRAVENOUS | Status: DC

## 2022-06-13 MED ORDER — VANCOMYCIN HCL 1500 MG/300ML IV SOLN
1500.0000 mg | Freq: Once | INTRAVENOUS | Status: AC
  Administered 2022-06-13: 1500 mg via INTRAVENOUS
  Filled 2022-06-13: qty 300

## 2022-06-13 MED ORDER — ANTICOAGULANT SODIUM CITRATE 4% (200MG/5ML) IV SOLN: 5.0000 mL | Status: DC | PRN

## 2022-06-13 MED ORDER — PENTAFLUOROPROP-TETRAFLUOROETH EX AERO: 1.0000 | INHALATION_SPRAY | CUTANEOUS | Status: DC | PRN

## 2022-06-13 MED ORDER — SODIUM CHLORIDE 0.9 % IV SOLN
3.0000 g | INTRAVENOUS | Status: DC
  Administered 2022-06-13 – 2022-06-15 (×3): 3 g via INTRAVENOUS
  Filled 2022-06-13 (×3): qty 8

## 2022-06-13 MED ORDER — HEPARIN SODIUM (PORCINE) 5000 UNIT/ML IJ SOLN
5000.0000 [IU] | Freq: Three times a day (TID) | INTRAMUSCULAR | Status: DC
  Administered 2022-06-13 – 2022-06-15 (×6): 5000 [IU] via SUBCUTANEOUS
  Filled 2022-06-13 (×6): qty 1

## 2022-06-13 MED ORDER — DARBEPOETIN ALFA 200 MCG/0.4ML IJ SOSY
200.0000 ug | PREFILLED_SYRINGE | INTRAMUSCULAR | Status: DC
  Administered 2022-06-14: 200 ug via SUBCUTANEOUS
  Filled 2022-06-13: qty 0.4

## 2022-06-13 MED ORDER — LIDOCAINE HCL (PF) 1 % IJ SOLN: 5.0000 mL | INTRAMUSCULAR | Status: DC | PRN

## 2022-06-13 NOTE — Progress Notes (Signed)
[facility] Progress Note   Subjective:    Completed dialysis yesterday --net UF 3L  Worsening hyponatremia after HD. He has been drinking a lot, thirsty with high CGBs.  Is a little more SOB today   Objective Vitals:   06/12/22 2006 06/13/22 0554 06/13/22 0800 06/13/22 0917  BP: 115/77 129/85 (!) 137/93 128/85  Pulse: 75 84  95  Resp: '16 17  18  '$ Temp: 98.8 F (37.1 C) 98.8 F (37.1 C)  (!) 97.3 F (36.3 C)  TempSrc:    Oral  SpO2: 96% 94%  93%  Weight:  116.4 kg    Height:       Physical Exam General: Awake, alert, on RA, NAD Heart: RRR No murmur, rub  Lungs: Clear throughout;  Abdomen: Soft non-tender Extremities: L BKA  Dialysis Access: L AVF (+) B/T   Filed Weights   06/09/22 1721 06/12/22 0827 06/13/22 0554  Weight: 112.2 kg 117.9 kg 116.4 kg    Intake/Output Summary (Last 24 hours) at 06/13/2022 1142 Last data filed at 06/13/2022 1048 Gross per 24 hour  Intake 2091.28 ml  Output 3000 ml  Net -908.72 ml     Additional Objective Labs: Basic Metabolic Panel: Recent Labs  Lab 06/10/22 0817 06/11/22 0358 06/12/22 0050 06/13/22 0951  NA 129* 126* 124* 122*  K 3.9 4.4 4.4 4.2  CL 89* 87* 88* 86*  CO2 '26 25 23 22  '$ GLUCOSE 309* 267* 255* 290*  BUN 44* 71* 85* 68*  CREATININE 6.08* 6.90* 7.72* 6.48*  CALCIUM 8.8* 9.1 8.6* 8.4*  PHOS 4.8*  --   --  5.3*    Liver Function Tests: Recent Labs  Lab 06/10/22 0817 06/11/22 0358 06/12/22 0050 06/13/22 0951  AST 40 50* 37  --   ALT '24 31 24  '$ --   ALKPHOS 98 172* 118  --   BILITOT 0.7 0.6 0.6  --   PROT 6.4* 6.9 5.9*  --   ALBUMIN 1.7* 1.9* 1.6* 1.7*    No results for input(s): "LIPASE", "AMYLASE" in the last 168 hours. CBC: Recent Labs  Lab 06/09/22 0238 06/10/22 0817 06/11/22 0358 06/12/22 0050 06/13/22 0951  WBC 16.4* 13.9* 16.9* 16.4* 22.2*  NEUTROABS  --  12.3* 15.0* 14.3*  --   HGB 7.5* 7.6* 8.9* 8.0* 9.2*  HCT 23.8* 24.2* 27.6* 26.0* 30.0*  MCV 86.9 87.7 86.3 88.4  88.8  PLT 304 271 285 273 311    Blood Culture    Component Value Date/Time   SDES TISSUE 06/11/2022 1449   SPECREQUEST LEFT BKA 06/11/2022 1449   CULT  06/11/2022 1449    RARE STAPHYLOCOCCUS AUREUS SUSCEPTIBILITIES TO FOLLOW Performed at [facility], [address], [place], [place] [address]    REPTSTATUS PENDING 06/11/2022 1449    Cardiac Enzymes: Recent Labs  Lab 06/10/22 0817  CKTOTAL 128    CBG: Recent Labs  Lab 06/12/22 1323 06/12/22 1611 06/12/22 2055 06/13/22 0722 06/13/22 1131  GLUCAP 177* 257* 281* 216* 263*    Iron Studies: No results for input(s): "IRON", "TIBC", "TRANSFERRIN", "FERRITIN" in the last 72 hours. Lab Results  Component Value Date   INR 1.3 (H) 04/21/2021   INR 1.3 (H) 04/20/2021   INR 1.2 10/07/2020   Studies/Results: No results found.  Medications:  sodium chloride 75 mL/hr at 06/13/22 0423   magnesium sulfate bolus IVPB     piperacillin-tazobactam (ZOSYN)  IV 2.25 g (06/13/22 0554)    (feeding supplement) PROSource Plus  30 mL Oral TID BM   vitamin C  1,000 mg Oral Daily   atorvastatin  40 mg Oral Daily   calcitRIOL  1.25 mcg Oral Q M,W,F-HD   carvedilol  25 mg Oral BID WC   Chlorhexidine Gluconate Cloth  6 each Topical Q0600   docusate sodium  100 mg Oral Daily   feeding supplement (NEPRO CARB STEADY)  237 mL Oral BID BM   heparin injection (subcutaneous)  5,000 Units Subcutaneous Q8H   insulin aspart  0-5 Units Subcutaneous QHS   insulin aspart  0-6 Units Subcutaneous TID WC   insulin glargine-yfgn  6 Units Subcutaneous Daily   irbesartan  150 mg Oral Daily   lactobacillus  1 g Oral TID WC   multivitamin  1 tablet Oral QHS   nutrition supplement (JUVEN)  1 packet Oral BID BM   pantoprazole  40 mg Oral Daily   sucroferric oxyhydroxide  1,000 mg Oral TID WC   torsemide  100 mg Oral Once per day on Sun Tue Thu Sat   zinc sulfate  220 mg Oral Daily    Dialysis Orders: [facility] MWF 4h 1mn  400/800   108.5kg   2/2  bath  LFA AVF  Hep none - last HD 2/19, post wt 108kg   - rocaltrol 1.25 mcg po tiw - mircera 225 mcg IV  q 2wks, last 2/12, due 2/26 - last Hb 8.6 on 2/14  Home meds include - lipitor, coreg 25 bid, amaryl, irbesartan 150 qd, semaglutide, demadex '100mg'$  on non-hd days, velphoro '1000mg'$  ac tid, prns/ vits/ supps   Assessment/Plan: 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 IV antibiotics per primary.  Hyponatremia - worsening in the setting of hypervolemia. Plan for extra HD tonight to get volume down.  ESRD - on HD MWF. Off schedule d/t surgery. Had HD Saturday. Extra HD today.  HTN/ volume - BP ok . Follow trends. May need to titrate meds. Needs  vol down w/ HD as well. Follow weights post amputation -weights up.  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 Next ESA due 2/26.  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 Cont po vdra and velphoro as binder. Will add protein supplements DM2 per pmd Nutrition - hypoalbuminemia- Give albumin with HD today. Continue prot supp. Fluid restriction discussed with patient.   OLynnda ChildPA-C [employer]2/25/2024,11:42 AM

## 2022-06-13 NOTE — Progress Notes (Addendum)
Received patient in bed ,awake alert and oriented x 4.Vitals were stable.  Access used" Left lower arm fistula that worked well.  Duration of treatment: 2.5 hours as ordered.  Medicine given ; None  Fluid removed: 2,900 CC  Hemo tx issue/comment; None.  Hand off to the patient's nurse.

## 2022-06-13 NOTE — Progress Notes (Signed)
Pharmacy Antibiotic Note  Scott Castillo is a 62 y.o. male admitted on 06/08/2022 with  diabetic foot infection . Pharmacy was consulted for vancomycin dosing for 7 days on admit. Now also consulted for Zosyn.   Patient has PMH of DM2, HTN, ESRD on MWF dialysis, severe diabetic neuropathy of lower extremities, and history of ulcer with abscess drained in December. Xray at [facility] worrisome for osteomyelitis with calcaneous fracture present. Vancomycin 2g IV at 1117 AM and cefepime 2g IV at 1415 given at [facility] on 06/08/22.  ESRD _ HD MWF, HD last HD done 2/24 off schedule due to surgery on 2/23. Plan for ESRD-HD back to MWF.   Vanc was discontinued post op 2/23 per Ortho.   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 Infection from gas-forming organism. 2/23 Tissue cx L BKA:   Staph Aureus, sens pending 2/23 MRSA PCR: negative  2/21 blood: NGTD x4d 2/21 MRSA [id number]  2/21 HIV: non-reactive  Dr. Posey Pronto consulted pharmacy 06/13/22  to restart Vancomycin and switch from Zosoyon to Unasyn for wound infection/ ostemyelitis.    Plan:  Vancomycin1500 mg IV x1 then Vanc 1gm IV qHD MWF.  Monitor HD schedule for any need to adjust timing of Vanc doses. Unasyn 3g IV q24h  (on HD days give after HD).  Follow up culture data, clinical course and antibiotic plans. Vanc levels per protocol,  Goal pre HD Vanc level 15-20 mcg/ml.   Height: 6' (182.9 cm) Weight: 116.4 kg (256 lb 9.9 oz) IBW/kg (Calculated) : 77.6  Temp (24hrs), Avg:98.2 F (36.8 C), Min:97.3 F (36.3 C), Max:98.8 F (37.1 C)  Recent Labs  Lab 06/09/22 0238 06/10/22 0817 06/11/22 0358 06/12/22 0050 06/13/22 0951  WBC 16.4* 13.9* 16.9* 16.4* 22.2*  CREATININE 6.95* 6.08* 6.90* 7.72* 6.48*     ESRD  Allergies  Allergen Reactions   Heparin Other (See Comments)    Per pt he has episodes where he can't speak. He has tolerated this med multiple times since  this entered so unlikely it is true allergy   Pioglitazone Swelling    Site of swelling not recalled by patient   Adhesive [Tape] Other (See Comments)    Causes redness, pt prefers paper tape    Latex Rash    Redness, also    Antimicrobials this admission: Vancomycin 2/20 (2gm at [facility] 11am)>> Cefepime 2gm x 1 at [facility] on 2/20 Ceftriaxone x 1 on 2/21  Flagyl PO 2/21 x 2 doses  Zosyn 2/21>>  Dose adjustments this admission: n/a  Microbiology results: 2/23 Tissue cx L BKA:   Staph Aureus, sens pending 2/23 MRSA PCR: negative  2/21 blood: NGTD x4d 2/21 MRSA [id number]  2/21 HIV: non-reactive  Thank you for allowing pharmacy to be a part of this patient's care. Nicole Cella, RPh Clinical Pharmacist  06/13/2022 2:14 PM

## 2022-06-13 NOTE — Progress Notes (Signed)
[facility] Progress Note Patient: Scott Castillo [id number] DOB: 1960-08-30 DOA: 06/08/2022  DOS: the patient was seen and examined on 06/13/2022  Brief hospital course: PMH of type II DM, HTN, ESRD on HD MWF, neuropathy, nephropathy, retinopathy, nonhealing ulcers on the leg seen by podiatry. Present to the hospital with complaints of worsening wound and drainage ongoing since December 2023.  Was initially sent to current of hospital ER from podiatry office-[facility]. 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  Orthopedic consulted.  Most likely will require surgical management tentatively on Friday.  Nephrology also following for HD needs.  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 Infection from gas-forming organism. No blood cultures performed before initiation of antibiotic here in the hospital.  Cultures so far negative blood. ABI unable to compress the vasculature. Orthopedic consulted.  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 Culture sent.  Monitor. Initially started on Vanco Flagyl and Rocephin. I changed to [place].  Currently on Zosyn.  Wound culture growing Staph aureus.  Will change Zosyn to Unasyn and add vancomycin again.   Concerns with heparin. See note from 2/24. Extensive discussion with the patient. Patient thought that the heparin was responsible for episodes of delirium that was thought to be secondary to dialysis disequilibrium syndrome in 2022. Currently patient agreeable for heparin for DVT prophylaxis given his risk for DVT is higher. Will monitor.   Right hip pain. X-ray unremarkable. Monitor.   ESRD on HD MWF. Nephrology consulted. Appreciate nephrology assistance for HD in the hospital.   HTN. Blood pressure stable. Continue home regimen.   Type 2 diabetes mellitus with ESRD and neuropathy with nonhealing wound,  uncontrolled with hyperglycemia. Currently on sliding scale insulin.   Obesity Body mass index is 34.8 kg/m.  Placing the pt at higher risk of poor outcomes.  Left heel unstageable pressure injury present on admission. Medial stage II coccyx pressure injury present on admission. Continue foam dressing wound coccyx.  Subjective: No nausea no vomiting.  Still has some pain.  No fever no chills.  Physical Exam: General: in Mild distress, No Rash Cardiovascular: S1 and S2 Present, No Murmur Respiratory: Good respiratory effort, Bilateral Air entry present. No Crackles, No wheezes Abdomen: Bowel Sound present, No tenderness Extremities: No edema, left BKA with wound VAC in place. Neuro: Alert and oriented x3, no new focal deficit  Data Reviewed: I have Reviewed nursing notes, Vitals, and Lab results. Since last encounter, pertinent lab results CBC and BMP   . I have ordered test including CBC and BMP  .   Disposition: Status is: Inpatient Remains inpatient appropriate because: Need therapy, antibiotics, continues to finalize.  heparin injection 5,000 Units Start: 06/13/22 1400 SCD's Start: 06/11/22 1706   Family Communication: Wife at bedside. Level of care: Med-Surg  Vitals:   06/13/22 0554 06/13/22 0800 06/13/22 0917 06/13/22 1650  BP: 129/85 (!) 137/93 128/85 113/83  Pulse: 84  95 86  Resp: '17  18 18  '$ Temp: 98.8 F (37.1 C)  (!) 97.3 F (36.3 C) 98.2 F (36.8 C)  TempSrc:   Oral   SpO2: 94%  93% 92%  Weight: 116.4 kg     Height:         Author: Berle Mull, MD 06/13/2022 5:01 PM  Please look on [email or web address] to find out who is on call.

## 2022-06-13 NOTE — PMR Pre-admission (Signed)
PMR Admission Coordinator Pre-Admission Assessment  Patient: Scott Castillo is an 62 y.o., male MRN: [id number] DOB: [birth year] Height: 6' (182.9 cm) Weight: 116.4 kg  Insurance Information HMO: ***    PPO: yes     PCP: ***     IPA: ***     80/20: ***     OTHER: *** PRIMARY: BCBS comm PPO      Policy#: [id number]      Subscriber: patient CM Name: ***      Phone#: ***     Fax#: *** Pre-Cert#: ***      Employer: *** Benefits:  Phone #: [phone]     Name: Irene Shipper. Date: ***     Deduct: ***      Out of Pocket Max: ***      Life Max: *** CIR: ***      SNF: *** Outpatient: ***     Co-Pay: *** Home Health: ***      Co-Pay: *** DME: ***     Co-Pay: *** Providers: in network  SECONDARY: Medicare A and B      Policy#: [id number]     Phone#: Checked in passport one source  Development worker, community:       Phone#:   The Therapist, art Information Summary" for patients in Inpatient Rehabilitation Facilities with attached "Privacy Act [place] Records" was provided and verbally reviewed with: Patient  Emergency Contact Information Contact Information     Name Relation Home Work [employer] Spouse [phone]  [phone]   [place] Daughter   [phone]   Claris Gower Mother [id number]     King,[place] Father   [phone]       Current Medical History  Patient Admitting Diagnosis: L BKA  History of Present Illness: A 62 y.o. male with medical history significant of DM2, HTN, ESRD on MWF dialysis.  ESRD in setting of diabetic nephropathy, also has known diabetic retinopathy and severe diabetic neuropathy of LE.  Pt in to ED at [facility] on 06/08/22 with diabetic foot infection and ulcer.  Ulcer with abscess drained in Dec.  Has been being cared fore by podiatry.  Despite ABx and wound care has now progressed to infection.  Saw Dr. Gean Quint ([facility]) 06/08/22 who sent him in to ED at [facility] for ABx.  X ray at [facility] worrisome for osteomyelitis with calcaneus fracture  present.  Pt later transferred to [facility] on 06/08/22 for availability of surgery for foot.  On 06/11/22 patient underwent L BKA by Dr. Sharol Given.  PT/OT evaluations were completed with recommendations for acute inpatient rehab admission.   Patient's medical record from [facility] has been reviewed by the rehabilitation admission coordinator and physician.  Past Medical History  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   H/O pancreatic cancer    HLD (hyperlipidemia)    HTN (hypertension)    Lumbar radiculopathy, chronic    hemilamectomy '94   Venous stasis dermatitis of both lower extremities     Has the patient had Spatz surgery during 100 days prior to admission? Yes  Family History   family history includes Edema in his mother; Heart attack in his brother, brother, and father; Heart failure in his father.  Current Medications  Current Facility-Administered Medications:    (feeding supplement) PROSource Plus liquid 30  mL, 30 mL, Oral, TID BM, Newt Minion, MD, 30 mL at 06/13/22 1305   acetaminophen (TYLENOL) tablet 650 mg, 650 mg, Oral, Q6H PRN **OR** acetaminophen (TYLENOL) suppository 650 mg, 650 mg, Rectal, Q6H PRN, Newt Minion, MD   alum & mag hydroxide-simeth (MAALOX/MYLANTA) 200-200-20 MG/5ML suspension 15-30 mL, 15-30 mL, Oral, Q2H PRN, Newt Minion, MD   Ampicillin-Sulbactam (UNASYN) 3 g in sodium chloride 0.9 % 100 mL IVPB, 3 g, Intravenous, Q24H, Wendee Beavers, RPH, Last Rate: 200 mL/hr at 06/13/22 1512, 3 g at 06/13/22 1512   ascorbic acid (VITAMIN C) tablet 1,000 mg, 1,000 mg, Oral, Daily, Newt Minion, MD, 1,000 mg at 06/13/22 0827   atorvastatin (LIPITOR) tablet 40 mg, 40 mg, Oral, Daily, Newt Minion, MD, 40 mg at 06/13/22 0827   bisacodyl (DULCOLAX) EC tablet 5 mg, 5 mg, Oral, Daily  PRN, Newt Minion, MD   calcitRIOL (ROCALTROL) capsule 1.25 mcg, 1.25 mcg, Oral, Q M,W,F-HD, Newt Minion, MD, 1.25 mcg at 06/09/22 1648   carvedilol (COREG) tablet 25 mg, 25 mg, Oral, BID WC, Newt Minion, MD, 25 mg at 06/13/22 [id number]   Chlorhexidine Gluconate Cloth 2 % PADS 6 each, 6 each, Topical, Q0600, Newt Minion, MD, 6 each at 06/13/22 0554   [START ON 06/14/2022] Darbepoetin Alfa (ARANESP) injection 200 mcg, 200 mcg, Subcutaneous, Q Mon-1800, Ejigiri, Ogechi Grace, PA-C   docusate sodium (COLACE) capsule 100 mg, 100 mg, Oral, Daily, Meridee Score V, MD, 100 mg at 06/13/22 0827   feeding supplement (NEPRO CARB STEADY) liquid 237 mL, 237 mL, Oral, BID BM, Newt Minion, MD, 237 mL at 06/13/22 1307   guaiFENesin-dextromethorphan (ROBITUSSIN DM) 100-10 MG/5ML syrup 15 mL, 15 mL, Oral, Q4H PRN, Newt Minion, MD   heparin injection 5,000 Units, 5,000 Units, Subcutaneous, Q8H, Lavina Hamman, MD, 5,000 Units at 06/13/22 1308   hydrALAZINE (APRESOLINE) injection 5 mg, 5 mg, Intravenous, Q20 Min PRN, Newt Minion, MD   HYDROmorphone (DILAUDID) injection 0.5-1 mg, 0.5-1 mg, Intravenous, Q4H PRN, Newt Minion, MD   insulin aspart (novoLOG) injection 0-5 Units, 0-5 Units, Subcutaneous, QHS, Newt Minion, MD, 3 Units at 06/12/22 2133   insulin aspart (novoLOG) injection 0-6 Units, 0-6 Units, Subcutaneous, TID WC, Newt Minion, MD, 3 Units at 06/13/22 1157   insulin glargine-yfgn [facility]) injection 6 Units, 6 Units, Subcutaneous, Daily, Lavina Hamman, MD, 6 Units at 06/13/22 [id number]   irbesartan (AVAPRO) tablet 150 mg, 150 mg, Oral, Daily, Newt Minion, MD, 150 mg at 06/13/22 0827   labetalol (NORMODYNE) injection 10 mg, 10 mg, Intravenous, Q10 min PRN, Newt Minion, MD   lactobacillus (FLORANEX/LACTINEX) granules 1 g, 1 g, Oral, TID WC, Newt Minion, MD, 1 g at 06/13/22 1156   magnesium sulfate IVPB 2 g 50 mL, 2 g, Intravenous, Daily PRN, Newt Minion, MD   metoprolol tartrate  (LOPRESSOR) injection 2-5 mg, 2-5 mg, Intravenous, Q2H PRN, Newt Minion, MD   multivitamin (RENA-VIT) tablet 1 tablet, 1 tablet, Oral, QHS, Newt Minion, MD, 1 tablet at 06/12/22 2130   nutrition supplement (JUVEN) (JUVEN) powder packet 1 packet, 1 packet, Oral, BID BM, Newt Minion, MD, 1 packet at 06/13/22 1304   ondansetron (ZOFRAN) tablet 4 mg, 4 mg, Oral, Q6H PRN **OR** ondansetron (ZOFRAN) injection 4 mg, 4 mg, Intravenous, Q6H PRN, Newt Minion, MD   Oral care mouth rinse, 15 mL, Mouth Rinse, PRN, Meridee Score  V, MD   oxyCODONE (Oxy IR/ROXICODONE) immediate release tablet 10-15 mg, 10-15 mg, Oral, Q4H PRN, Newt Minion, MD   oxyCODONE (Oxy IR/ROXICODONE) immediate release tablet 5-10 mg, 5-10 mg, Oral, Q4H PRN, Newt Minion, MD, 10 mg at 06/12/22 0856   pantoprazole (PROTONIX) EC tablet 40 mg, 40 mg, Oral, Daily, Newt Minion, MD, 40 mg at 06/13/22 0827   phenol (CHLORASEPTIC) mouth spray 1 spray, 1 spray, Mouth/Throat, PRN, Newt Minion, MD   polyethylene glycol (MIRALAX / GLYCOLAX) packet 17 g, 17 g, Oral, Daily PRN, Newt Minion, MD   potassium chloride SA (KLOR-CON M) CR tablet 20-40 mEq, 20-40 mEq, Oral, Daily PRN, Newt Minion, MD   sucroferric oxyhydroxide [facility]) chewable tablet 1,000 mg, 1,000 mg, Oral, TID WC, Newt Minion, MD, 1,000 mg at 06/13/22 1156   torsemide [facility]) tablet 100 mg, 100 mg, Oral, Once per day on Sun Tue Thu Sat, Duda, Marcus V, MD, 100 mg at 06/13/22 0827   [START ON 06/14/2022] vancomycin (VANCOCIN) IVPB 1000 mg/200 mL premix, 1,000 mg, Intravenous, Q M,W,F-HD, Wendee Beavers, RPH   vancomycin (VANCOREADY) IVPB 1500 mg/300 mL, 1,500 mg, Intravenous, Once, Wendee Beavers, [facility], Last Rate: 150 mL/hr at 06/13/22 1516, 1,500 mg at 06/13/22 1516   zinc sulfate capsule 220 mg, 220 mg, Oral, Daily, Newt Minion, MD, 220 mg at 06/13/22 0827  Patients Current Diet:  Diet Order             Diet Carb Modified Fluid consistency: Thin; Room  service appropriate? Yes; Fluid restriction: 1200 mL Fluid  Diet effective now                   Precautions / Restrictions Precautions Precautions: Fall Restrictions Weight Bearing Restrictions: Yes LLE Weight Bearing: Non weight bearing   Has the patient had 2 or more falls or a fall with injury in the past year? 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  Does drive.  Prior Functional Level Self Care: Did the patient need help bathing, dressing, using the toilet or eating? Needed some help  Indoor Mobility: Did the patient need assistance with walking from room to room (with or without device)? Independent  Stairs: Did the patient need assistance with internal or external stairs (with or without device)? Needed some help  Functional Cognition: Did the patient need help planning regular tasks such as shopping or remembering to take medications? Independent  Patient Information Are you of Hispanic, Latino/a,or Spanish origin?: A. No, not of Hispanic, Latino/a, or Spanish origin What is your race?: A. White Do you need or want an interpreter to communicate with a doctor or health care staff?: 0. No  Patient's Response To:  Health Literacy and Transportation Is the patient able to respond to health literacy and transportation needs?: Yes Health Literacy - How often do you need to have someone help you when you read instructions, pamphlets, or other written material from your doctor or pharmacy?: Rarely In the past 12 months, has lack of transportation kept you from medical appointments or from getting medications?: Yes In the past 12 months, has lack of transportation kept you from meetings, work, or from getting things needed for daily living?: Yes  [place] Devices/Equipment: Environmental consultant (specify type), Eyeglasses Home Equipment: Conservation officer, nature (2 wheels), Rollator (4 wheels),  BSC/3in1, Grab bars - tub/shower, [employer] -  quad  Prior Device Use: Indicate devices/aids used by the patient prior to current illness, exacerbation or injury?  Cane  Current Functional Level Cognition  Overall Cognitive Status: Within Functional Limits for tasks assessed Orientation Level: Oriented X4    Extremity Assessment (includes Sensation/Coordination)  Upper Extremity Assessment: Defer to OT evaluation  Lower Extremity Assessment: Generalized weakness, LLE deficits/detail, RLE deficits/detail RLE Deficits / Details: pt reports some limitations due to recent hip pain LLE Deficits / Details: BKA    ADLs       Mobility  Overal bed mobility: Needs Assistance Bed Mobility: Supine to Sit, Sit to Supine, Rolling Rolling: Min guard Supine to sit: Min assist Sit to supine: Min guard General bed mobility comments: Assist to elevate trunk into sitting    Transfers  Overall transfer level: Needs assistance Equipment used: Rolling walker (2 wheels) Transfers: Sit to/from Stand Sit to Stand: Mod assist, From elevated surface General transfer comment: Heavy assist to bring hips up from bed. Incr time to rise and verbal clues to extend hips fully    Ambulation / Gait / Stairs / Wheelchair Mobility  Ambulation/Gait Pre-gait activities: Static standing at bedside x 30 sec with min assist    Posture / Balance Balance Overall balance assessment: Needs assistance Sitting-balance support: No upper extremity supported, Feet supported Sitting balance-Leahy Scale: Fair Standing balance support: Bilateral upper extremity supported, During functional activity, Reliant on assistive device for balance Standing balance-Leahy Scale: Poor Standing balance comment: walker and min assist for static standing    Special needs/care consideration Dialysis: Hemodialysis Monday, Wednesday, and Friday, Wound Vac Yes to L BKA, Skin Post op incision and VAC L BKA, Diabetic management h/o DM and on insulin,  and Special service needs ***   Previous Home Environment (from acute therapy documentation) Living Arrangements: Spouse/significant other Available Help at Discharge: Family Type of Home: Mobile home Home Layout: One level Home Access: Stairs to enter Entrance Stairs-Rails: Right, Left, Can reach both Entrance Stairs-Number of Steps: 4 Bathroom Shower/Tub: Chiropodist: Handicapped height Bathroom Accessibility: No Home Care Services: No Additional Comments: Wife thinks front door and bathroom door may be too narrow for w/c  Discharge Living Setting Plans for Discharge Living Setting: Mobile Home Type of Home at Discharge: Mobile home (Double wide) Discharge Home Layout: One level Discharge Home Access: Stairs to enter Entrance Stairs-Rails: Right, Left, Can reach both Entrance Stairs-Number of Steps: 4 Discharge Bathroom Shower/Tub: Tub/shower unit, Curtain Discharge Bathroom Toilet: Standard Discharge Bathroom Accessibility: No (Wife feels doors may be too narrow. ?sideways for walker?) Does the patient have any problems obtaining your medications?: No  Social/Family/Support Systems Patient Roles: Spouse, Parent (Has a wife and a daughter.) Contact Information: Scott Castillo - wife - [phone] Anticipated Caregiver: wife Ability/Limitations of Caregiver: Wife works FT in Ambulance person 630 am to 3 pm.  Not sure who could stay with patient during her work time. Caregiver Availability: Evenings only (Wife understands that patient will need 24/7 supervision) Discharge Plan Discussed with Primary Caregiver: Yes Is Caregiver In Agreement with Plan?: Yes Does Caregiver/Family have Issues with Lodging/Transportation while Pt is in Rehab?: No  Goals Patient/Family Goal for Rehab: PT/OT supervision goals Expected length of stay: 10-14 days Pt/Family Agrees to Admission and willing to participate: Yes Program Orientation Provided & Reviewed with Pt/Caregiver  Including Roles  & Responsibilities: Yes  Decrease burden of Care through IP rehab admission: N/A  Possible need for SNF placement upon discharge: Yes, if wife cannot assure 24/7  supervision at discharge  Patient Condition: I have reviewed medical records from [facility], spoken with CM, and patient and spouse. I met with patient at the bedside for inpatient rehabilitation assessment.  Patient will benefit from ongoing PT and OT, can actively participate in 3 hours of therapy a day 5 days of the week, and can make measurable gains during the admission.  Patient will also benefit from the coordinated team approach during an Inpatient Acute Rehabilitation admission.  The patient will receive intensive therapy as well as Rehabilitation physician, nursing, social worker, and care management interventions.  Due to bladder management, bowel management, safety, skin/wound care, disease management, medication administration, pain management, and patient education the patient requires 24 hour a day rehabilitation nursing.  The patient is currently *** with mobility and basic ADLs.  Discharge setting and therapy post discharge at home with outpatient is anticipated.  Patient has agreed to participate in the Acute Inpatient Rehabilitation Program and will admit {Time; today/tomorrow:10263}.  Preadmission Screen Completed By:  Retta Diones, 06/13/2022 3:24 PM ______________________________________________________________________   Discussed status with Dr. Marland Kitchen on *** at *** and received approval for admission today.  Admission Coordinator:  Retta Diones, RN, time Marland KitchenSudie Grumbling ***   Assessment/Plan: Diagnosis: Does the need for close, 24 hr/day Medical supervision in concert with the patient's rehab needs make it unreasonable for this patient to be served in a less intensive setting? {yes_no_potentially:3041433} Co-Morbidities requiring supervision/potential complications: *** Due to {due RE:[id number],  does the patient require 24 hr/day rehab nursing? {yes_no_potentially:3041433} Does the patient require coordinated care of a physician, rehab nurse, PT, OT, and SLP to address physical and functional deficits in the context of the above medical diagnosis(es)? {yes_no_potentially:3041433} Addressing deficits in the following areas: {deficits:3041436} Can the patient actively participate in an intensive therapy program of at least 3 hrs of therapy 5 days a week? {yes_no_potentially:3041433} The potential for patient to make measurable gains while on inpatient rehab is {potential:3041437} Anticipated functional outcomes upon discharge from inpatient rehab: {functional outcomes:304600100} PT, {functional outcomes:304600100} OT, {functional outcomes:304600100} SLP Estimated rehab length of stay to reach the above functional goals is: *** Anticipated discharge destination: {anticipated dc setting:21604} 10. Overall Rehab/Functional Prognosis: {potential:3041437}   MD Signature: ***

## 2022-06-13 NOTE — Evaluation (Signed)
Physical Therapy Evaluation Patient Details Name: Scott Castillo MRN: [id number] DOB: December 31, 1960 Today's Date: 06/13/2022  History of Present Illness  Pt is 62 year old presented to [facility] on  06/09/22 with lt foot infection. 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 PMH - ESRD on HD, HTN, DM, neuropathy, retinopathy, pancreatic CA  Clinical Impression  Pt presents to PT with decr mobility after Lt BKA. Able to stand at bedside but not stable enough to pivot safely to chair. Lateral scooting up side of be with min assist. Will work toward scooting transfer to chair as primary means of transfer/mobility. May get to the place he can use walker some with assist but feel he is more likely going to reach mod independent level with the w/c. His home has 3 steps to enter and he has HD 3x/wk. Likely will need a ramp. Recommend AIR for further rehab to progress to a level to be able to return home.        Recommendations for follow up therapy are one component of a multi-disciplinary discharge planning process, led by the attending physician.  Recommendations may be updated based on patient status, additional functional criteria and insurance authorization.  Follow Up Recommendations Acute inpatient rehab (3hours/day)      Assistance Recommended at Discharge Frequent or constant Supervision/Assistance  Patient can return home with the following  A lot of help with walking and/or transfers;Assist for transportation;Help with stairs or ramp for entrance    Equipment Recommendations Wheelchair (measurements PT);Wheelchair cushion (measurements PT)  Recommendations for Other Services       Functional Status Assessment Patient has had a recent decline in their functional status and demonstrates the ability to make significant improvements in function in a reasonable and predictable amount of time.     Precautions / Restrictions Precautions Precautions: Fall      Mobility  Bed Mobility Overal bed mobility:  Needs Assistance Bed Mobility: Supine to Sit, Sit to Supine, Rolling Rolling: Min guard   Supine to sit: Min assist Sit to supine: Min guard   General bed mobility comments: Assist to elevate trunk into sitting    Transfers Overall transfer level: Needs assistance Equipment used: Rolling walker (2 wheels) Transfers: Sit to/from Stand Sit to Stand: Mod assist, From elevated surface           General transfer comment: Heavy assist to bring hips up from bed. Incr time to rise and verbal clues to extend hips fully    Ambulation/Gait             Pre-gait activities: Static standing at bedside x 30 sec with min assist    Stairs            Wheelchair Mobility    Modified Rankin (Stroke Patients Only)       Balance Overall balance assessment: Needs assistance Sitting-balance support: No upper extremity supported, Feet supported Sitting balance-Leahy Scale: Fair     Standing balance support: Bilateral upper extremity supported, During functional activity, Reliant on assistive device for balance Standing balance-Leahy Scale: Poor Standing balance comment: walker and min assist for static standing                             Pertinent Vitals/Pain Pain Assessment Pain Assessment: No/denies pain    Home Living Family/patient expects to be discharged to:: Private residence Living Arrangements: Spouse/significant other Available Help at Discharge: Family Type of Home: Mobile home Home Access: Stairs to  enter Entrance Stairs-Rails: Right;Left;Can reach both Entrance Stairs-Number of Steps: 4   Home Layout: One level Home Equipment: Conservation officer, nature (2 wheels);Rollator (4 wheels);BSC/3in1;Grab bars - tub/shower;Cane - quad Additional Comments: Wife thinks front door and bathroom door may be too narrow for w/c    Prior Function Prior Level of Function : Independent/Modified Independent             Mobility Comments: Amb with use of quad cane  or walker       Hand Dominance   Dominant Hand: Right    Extremity/Trunk Assessment   Upper Extremity Assessment Upper Extremity Assessment: Defer to OT evaluation    Lower Extremity Assessment Lower Extremity Assessment: Generalized weakness;LLE deficits/detail;RLE deficits/detail RLE Deficits / Details: pt reports some limitations due to recent hip pain LLE Deficits / Details: BKA       Communication   Communication: No difficulties  Cognition Arousal/Alertness: Awake/alert Behavior During Therapy: WFL for tasks assessed/performed Overall Cognitive Status: Within Functional Limits for tasks assessed                                          General Comments      Exercises Amputee Exercises Quad Sets: AROM, Left, 5 reps, Supine Hip ABduction/ADduction: AROM, Left, 5 reps, Supine Knee Flexion: AROM, Left, 5 reps, Supine Knee Extension: AROM, Left, 5 reps, Supine Straight Leg Raises: AROM, Left, 5 reps, Supine   Assessment/Plan    PT Assessment Patient needs continued PT services  PT Problem List Decreased strength;Decreased balance;Decreased mobility;Decreased knowledge of use of DME       PT Treatment Interventions DME instruction;Gait training;Functional mobility training;Therapeutic activities;Therapeutic exercise;Balance training;Patient/family education;Wheelchair mobility training    PT Goals (Current goals can be found in the Care Plan section)  Acute Rehab PT Goals Patient Stated Goal: return home PT Goal Formulation: With patient Time For Goal Achievement: 06/27/22 Potential to Achieve Goals: Good    Frequency Min 3X/week     Co-evaluation               AM-PAC PT "6 Clicks" Mobility  Outcome Measure Help needed turning from your back to your side while in a flat bed without using bedrails?: A Little Help needed moving from lying on your back to sitting on the side of a flat bed without using bedrails?: A Little Help  needed moving to and from a bed to a chair (including a wheelchair)?: Total Help needed standing up from a chair using your arms (e.g., wheelchair or bedside chair)?: A Lot Help needed to walk in hospital room?: Total Help needed climbing 3-5 steps with a railing? : Total 6 Click Score: 11    End of Session Equipment Utilized During Treatment: Gait belt Activity Tolerance: Patient tolerated treatment well Patient left: in bed;with call bell/phone within reach;with bed alarm set;with family/visitor present Nurse Communication: Mobility status PT Visit Diagnosis: Other abnormalities of gait and mobility (R26.89);Difficulty in walking, not elsewhere classified (R26.2)    Time: 1131-1203 PT Time Calculation (min) (ACUTE ONLY): 32 min   Charges:   PT Evaluation $PT Eval Moderate Complexity: 1 Mod PT Treatments $Therapeutic Activity: 8-22 mins        [place] [phone]   Shary Decamp [facility] 06/13/2022, 12:55 PM

## 2022-06-13 NOTE — Progress Notes (Signed)
IP rehab admissions - Met with patient and his wife at the bedside.  Gave them rehab booklets and explained rehab options.  Patient and wife are interested in [place] here at [facility].  Patient has HD M-W-F.  Noted PT recommending inpatient rehab stay. Wife works but she can potentially take FMLA.  Wife also talked about "[facility]" where patient might be able to go while wife is at work.  Awaiting OT evaluation and recommendations.  I will have my partner follow up on Monday to begin pre-auth process for potential CIR admission.  Call me for questions.  [phone]

## 2022-06-14 ENCOUNTER — Encounter (HOSPITAL_COMMUNITY): Payer: Self-pay | Admitting: Orthopedic Surgery

## 2022-06-14 DIAGNOSIS — L089 Local infection of the skin and subcutaneous tissue, unspecified: Secondary | ICD-10-CM | POA: Diagnosis not present

## 2022-06-14 DIAGNOSIS — E11628 Type 2 diabetes mellitus with other skin complications: Secondary | ICD-10-CM | POA: Diagnosis not present

## 2022-06-14 LAB — CBC
HCT: 26.4 % — ABNORMAL LOW (ref 39.0–52.0)
Hemoglobin: 8.5 g/dL — ABNORMAL LOW (ref 13.0–17.0)
MCH: 28.1 pg (ref 26.0–34.0)
MCHC: 32.2 g/dL (ref 30.0–36.0)
MCV: 87.1 fL (ref 80.0–100.0)
Platelets: 254 10*3/uL (ref 150–400)
RBC: 3.03 MIL/uL — ABNORMAL LOW (ref 4.22–5.81)
RDW: 17.3 % — ABNORMAL HIGH (ref 11.5–15.5)
WBC: 20.4 10*3/uL — ABNORMAL HIGH (ref 4.0–10.5)
nRBC: 0 % (ref 0.0–0.2)

## 2022-06-14 LAB — GLUCOSE, CAPILLARY
Glucose-Capillary: 216 mg/dL — ABNORMAL HIGH (ref 70–99)
Glucose-Capillary: 229 mg/dL — ABNORMAL HIGH (ref 70–99)
Glucose-Capillary: 158 mg/dL — ABNORMAL HIGH (ref 70–99)
Glucose-Capillary: 155 mg/dL — ABNORMAL HIGH (ref 70–99)

## 2022-06-14 LAB — CULTURE, BLOOD (ROUTINE X 2)
Culture: NO GROWTH
Culture: NO GROWTH
Special Requests: ADEQUATE
Special Requests: ADEQUATE

## 2022-06-14 LAB — RENAL FUNCTION PANEL
Albumin: 1.6 g/dL — ABNORMAL LOW (ref 3.5–5.0)
Anion gap: 12 (ref 5–15)
BUN: 53 mg/dL — ABNORMAL HIGH (ref 6–20)
CO2: 23 mmol/L (ref 22–32)
Calcium: 8.4 mg/dL — ABNORMAL LOW (ref 8.9–10.3)
Chloride: 89 mmol/L — ABNORMAL LOW (ref 98–111)
Creatinine, Ser: 5.49 mg/dL — ABNORMAL HIGH (ref 0.61–1.24)
GFR, Estimated: 11 mL/min — ABNORMAL LOW (ref >60)
Glucose, Bld: 225 mg/dL — ABNORMAL HIGH (ref 70–99)
Phosphorus: 4.3 mg/dL (ref 2.5–4.6)
Potassium: 3.9 mmol/L (ref 3.5–5.1)
Sodium: 124 mmol/L — ABNORMAL LOW (ref 135–145)

## 2022-06-14 LAB — MAGNESIUM: Magnesium: 1.9 mg/dL (ref 1.7–2.4)

## 2022-06-14 MED ORDER — GERHARDT'S BUTT CREAM
TOPICAL_CREAM | Freq: Two times a day (BID) | CUTANEOUS | Status: DC
  Filled 2022-06-14: qty 1

## 2022-06-14 MED ORDER — ALTEPLASE 2 MG IJ SOLR: 2.0000 mg | Freq: Once | INTRAMUSCULAR | Status: DC | PRN

## 2022-06-14 MED ORDER — LIDOCAINE-PRILOCAINE 2.5-2.5 % EX CREA: 1.0000 | TOPICAL_CREAM | CUTANEOUS | Status: DC | PRN

## 2022-06-14 MED ORDER — INSULIN ASPART 100 UNIT/ML IJ SOLN: 0.0000 [IU] | Freq: Every day | INTRAMUSCULAR | Status: DC

## 2022-06-14 MED ORDER — OXYCODONE HCL 5 MG PO TABS: 5.0000 mg | ORAL_TABLET | ORAL | Status: DC | PRN

## 2022-06-14 MED ORDER — LIDOCAINE HCL (PF) 1 % IJ SOLN: 5.0000 mL | INTRAMUSCULAR | Status: DC | PRN

## 2022-06-14 MED ORDER — IRBESARTAN 75 MG PO TABS
75.0000 mg | ORAL_TABLET | Freq: Every day | ORAL | Status: DC
  Administered 2022-06-15: 75 mg via ORAL
  Filled 2022-06-14: qty 1

## 2022-06-14 MED ORDER — PENTAFLUOROPROP-TETRAFLUOROETH EX AERO: 1.0000 | INHALATION_SPRAY | CUTANEOUS | Status: DC | PRN

## 2022-06-14 MED ORDER — INSULIN ASPART 100 UNIT/ML IJ SOLN
0.0000 [IU] | Freq: Three times a day (TID) | INTRAMUSCULAR | Status: DC
  Administered 2022-06-14: 3 [IU] via SUBCUTANEOUS
  Administered 2022-06-14: 5 [IU] via SUBCUTANEOUS
  Administered 2022-06-14 – 2022-06-15 (×2): 3 [IU] via SUBCUTANEOUS
  Administered 2022-06-15: 8 [IU] via SUBCUTANEOUS

## 2022-06-14 MED ORDER — ANTICOAGULANT SODIUM CITRATE 4% (200MG/5ML) IV SOLN
5.0000 mL | Status: DC | PRN
  Filled 2022-06-14: qty 5

## 2022-06-14 MED ORDER — OXYCODONE HCL 5 MG PO TABS: 10.0000 mg | ORAL_TABLET | ORAL | Status: DC | PRN

## 2022-06-14 NOTE — Progress Notes (Addendum)
Received patient in bed to unit.  Alert and oriented.  Informed consent signed and in chart.   TX duration:3 hours  Patient tolerated well.  Transported back to the room  Alert, without acute distress.  Hand-off given to patient's nurse.   Access used: left AVF Access issues: NONE  Total UF removed: 4L Medication(s) given: CALITRIOL    06/14/22 1704  Vitals  Temp 98.6 F (37 C)  Temp Source Oral  BP 137/87  MAP (mmHg) 104  BP Location Right Arm  BP Method Automatic  Patient Position (if appropriate) Lying  Pulse Rate 89  Pulse Rate Source Monitor  ECG Heart Rate 89  Resp 20  Oxygen Therapy  SpO2 97 %  O2 Device Room Air  During Treatment Monitoring  Blood Flow Rate (mL/min) 400 mL/min  Arterial Pressure (mmHg) -200 mmHg  Venous Pressure (mmHg) 260 mmHg  TMP (mmHg) 17 mmHg  Ultrafiltration Rate (mL/min) 1100 mL/min  Dialysate Flow Rate (mL/min) 300 ml/min  HD Safety Checks Performed Yes  Intra-Hemodialysis Comments Tx completed  Dialysis Fluid Bolus Normal Saline  Bolus Amount (mL) 300 mL  Post Treatment  Dialyzer Clearance Lightly streaked  Duration of HD Treatment -hour(s) 3 hour(s)  Liters Processed 62.9  Fluid Removed (mL) 4000 mL  Tolerated HD Treatment Yes  AVG/AVF Arterial Site Held (minutes) 6 minutes  AVG/AVF Venous Site Held (minutes) 6 minutes      Braya Habermehl [facility]

## 2022-06-14 NOTE — Progress Notes (Signed)
Patient ID: Scott Castillo, male   DOB: 08-03-1960, 62 y.o.   MRN: [id number] Patient is status post transtibial amputation.  The wound cultures posterior left calf are showing staph.  Would discharge on oral antibiotics for 3 weeks.  Patient could benefit from inpatient rehab.

## 2022-06-14 NOTE — Progress Notes (Signed)
Occupational Therapy Treatment Patient Details Name: Scott Castillo MRN: [id number] DOB: 01/09/1961 Today's Date: 06/14/2022   History of present illness Pt is 62 year old presented to [facility] on  06/09/22 with lt foot infection. 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 PMH - ESRD on HD, HTN, DM, neuropathy, retinopathy, pancreatic CA   OT comments  Pt requiring +2 max assist to stand with stedy from lower surface of recliner. Unable to stand fully upright, supports trunk on crossbar of stedy. Total assist for pericare in standing.    Recommendations for follow up therapy are one component of a multi-disciplinary discharge planning process, led by the attending physician.  Recommendations may be updated based on patient status, additional functional criteria and insurance authorization.    Follow Up Recommendations  Acute inpatient rehab (3hours/day)     Assistance Recommended at Discharge Frequent or constant Supervision/Assistance  Patient can return home with the following  Two people to help with walking and/or transfers;A lot of help with bathing/dressing/bathroom;Assistance with cooking/housework;Assist for transportation;Help with stairs or ramp for entrance   Equipment Recommendations  Other (comment) (defer to next venue)    Recommendations for Other Services Rehab consult    Precautions / Restrictions Precautions Precautions: Fall Required Braces or Orthoses: Other Brace Other Brace: L limb protector Restrictions Weight Bearing Restrictions: Yes LLE Weight Bearing: Non weight bearing       Mobility Bed Mobility   Bed Mobility: Sit to Supine       Sit to supine: Min guard        Transfers Overall transfer level: Needs assistance Equipment used: Ambulation equipment used Transfers: Sit to/from Stand Sit to Stand: +2 physical assistance, Max assist           General transfer comment: +2 max assist using bed pad under hips, flexed posture on stedy bar     Balance  Overall balance assessment: Needs assistance   Sitting balance-Leahy Scale: Fair       Standing balance-Leahy Scale: Zero                             ADL either performed or assessed with clinical judgement   ADL Overall ADL's : Needs assistance/impaired Eating/Feeding: Set up;Sitting   Grooming: Set up;Bed level   Upper Body Bathing: Min guard;Sitting   Lower Body Bathing: Maximal assistance;Total assistance;Sitting/lateral leans;Sit to/from stand   Upper Body Dressing : Min guard;Sitting   Lower Body Dressing: Maximal assistance;Total assistance;Sitting/lateral leans;Sit to/from stand   Toilet Transfer: Moderate assistance;+2 for physical assistance;+2 for safety/equipment;Stand-pivot;Rolling walker (2 wheels) Toilet Transfer Details (indicate cue type and reason): heavy mod A of 2 initially, but able to complete 4 sit<> stands at [place] and Hygiene: Total assistance;Sit to/from stand Toileting - Clothing Manipulation Details (indicate cue type and reason): in stedy     Functional mobility during ADLs: Moderate assistance;+2 for physical assistance;+2 for safety/equipment;Cueing for safety;Cueing for sequencing;Rolling walker (2 wheels) General ADL Comments: Patient presenting with weakness, deficits in balance and stability, and need for increased assist to complete all ADLs    Extremity/Trunk Assessment Upper Extremity Assessment Upper Extremity Assessment: Generalized weakness (arthritis in joints)   Lower Extremity Assessment Lower Extremity Assessment: Defer to PT evaluation   Cervical / Trunk Assessment Cervical / Trunk Assessment: Normal    Vision Baseline Vision/History: 1 Wears glasses (Readers) Ability to See in Adequate Light: 0 Adequate Patient Visual Report: No change from baseline Vision Assessment?: No  apparent visual deficits   Perception     Praxis      Cognition Arousal/Alertness: Awake/alert Behavior  During Therapy: WFL for tasks assessed/performed Overall Cognitive Status: Within Functional Limits for tasks assessed                                          Exercises      Shoulder Instructions       General Comments      Pertinent Vitals/ Pain       Pain Assessment Pain Assessment: No/denies pain  Home Living Family/patient expects to be discharged to:: Private residence Living Arrangements: Spouse/significant other Available Help at Discharge: Family Type of Home: Mobile home Home Access: Stairs to enter Entrance Stairs-Number of Steps: 4 Entrance Stairs-Rails: Right;Left;Can reach both Home Layout: One level     Bathroom Shower/Tub: Teacher, early years/pre: Handicapped height Bathroom Accessibility: No   Home Equipment: Conservation officer, nature (2 wheels);Rollator (4 wheels);BSC/3in1;Grab bars - tub/shower;Cane - quad   Additional Comments: Wife thinks front door and bathroom door may be too narrow for w/c      Prior Functioning/Environment              Frequency  Min 2X/week        Progress Toward Goals  OT Goals(current goals can now be found in the care plan section)  Progress towards OT goals: Progressing toward goals  Acute Rehab OT Goals Patient Stated Goal: to get better OT Goal Formulation: With patient Time For Goal Achievement: 06/28/22 Potential to Achieve Goals: Good ADL Goals Pt Will Perform Lower Body Bathing: with min assist;with adaptive equipment;sitting/lateral leans;sit to/from stand Pt Will Perform Lower Body Dressing: sitting/lateral leans;sit to/from stand;with min assist;with adaptive equipment Pt Will Transfer to Toilet: with mod assist;stand pivot transfer;bedside commode Additional ADL Goal #1: Patient will demonstrate increased activity tolerance to complete functional task in standing for 2 minutes prior to fatigue and need for seated rest break.  Plan Discharge plan remains appropriate     Co-evaluation      Reason for Co-Treatment: For patient/therapist safety;To address functional/ADL transfers PT goals addressed during session: Balance;Proper use of DME;Mobility/safety with mobility        AM-PAC OT "6 Clicks" Daily Activity     Outcome Measure   Help from another person eating meals?: None Help from another person taking care of personal grooming?: A Little Help from another person toileting, which includes using toliet, bedpan, or urinal?: A Lot Help from another person bathing (including washing, rinsing, drying)?: A Lot Help from another person to put on and taking off regular upper body clothing?: A Little Help from another person to put on and taking off regular lower body clothing?: Total 6 Click Score: 15    End of Session Equipment Utilized During Treatment: Gait belt;Rolling walker (2 wheels)  OT Visit Diagnosis: Unsteadiness on feet (R26.81);Other abnormalities of gait and mobility (R26.89);Repeated falls (R29.6);Muscle weakness (generalized) (M62.81);History of falling (Z91.81)   Activity Tolerance Patient tolerated treatment well   Patient Left in bed;with call bell/phone within reach;with nursing/sitter in room   Nurse Communication Mobility status        Time: [id number] OT Time Calculation (min): 8 min  Charges: OT General Charges $OT Visit: 1 Visit OT Evaluation $OT Eval Moderate Complexity: 1 Mod OT Treatments $Therapeutic Activity: 8-22 mins  Cleta Alberts, OTR/L Acute Rehabilitation Services  Office: [phone]   Malka So 06/14/2022, 12:55 PM

## 2022-06-14 NOTE — Progress Notes (Signed)
[facility] Progress Note   Subjective: Up in chair. Looks pale. Stump protector/wound vac in place. BS 200s, Na 124. HD today run sequential.   Objective Vitals:   06/13/22 2235 06/13/22 2323 06/14/22 0524 06/14/22 0920  BP: (!) 118/91 (!) 146/131 135/85 (!) 148/85  Pulse: 86 86 92 85  Resp: (!) '21 18 18   '$ Temp:  97.6 F (36.4 C) 97.6 F (36.4 C) 97.7 F (36.5 C)  TempSrc:  Oral Oral Oral  SpO2: 97% 94% 96% 98%  Weight:      Height:       Physical Exam General: Chronically ill appearing  Heart: S1,S2 No M/R/G Lungs: CTAB Abdomen: NT, NABS Extremities: L BKA stump protector/wound vac in place.  Dialysis Access: L AVF +T/B    Additional Objective Labs: Basic Metabolic Panel: Recent Labs  Lab 06/10/22 0817 06/11/22 0358 06/12/22 0050 06/13/22 0951 06/14/22 0226  NA 129*   < > 124* 122* 124*  K 3.9   < > 4.4 4.2 3.9  CL 89*   < > 88* 86* 89*  CO2 26   < > '23 22 23  '$ GLUCOSE 309*   < > 255* 290* 225*  BUN 44*   < > 85* 68* 53*  CREATININE 6.08*   < > 7.72* 6.48* 5.49*  CALCIUM 8.8*   < > 8.6* 8.4* 8.4*  PHOS 4.8*  --   --  5.3* 4.3   < > = values in this interval not displayed.   Liver Function Tests: Recent Labs  Lab 06/10/22 0817 06/11/22 0358 06/12/22 0050 06/13/22 0951 06/14/22 0226  AST 40 50* 37  --   --   ALT '24 31 24  '$ --   --   ALKPHOS 98 172* 118  --   --   BILITOT 0.7 0.6 0.6  --   --   PROT 6.4* 6.9 5.9*  --   --   ALBUMIN 1.7* 1.9* 1.6* 1.7* 1.6*   No results for input(s): "LIPASE", "AMYLASE" in the last 168 hours. CBC: Recent Labs  Lab 06/10/22 0817 06/11/22 0358 06/12/22 0050 06/13/22 0951 06/14/22 0226  WBC 13.9* 16.9* 16.4* 22.2* 20.4*  NEUTROABS 12.3* 15.0* 14.3*  --   --   HGB 7.6* 8.9* 8.0* 9.2* 8.5*  HCT 24.2* 27.6* 26.0* 30.0* 26.4*  MCV 87.7 86.3 88.4 88.8 87.1  PLT 271 285 273 311 254   Blood Culture    Component Value Date/Time   SDES TISSUE 06/11/2022 1449   SPECREQUEST LEFT BKA 06/11/2022 1449    CULT  06/11/2022 1449    RARE STAPHYLOCOCCUS AUREUS SUSCEPTIBILITIES TO FOLLOW NO ANAEROBES ISOLATED; CULTURE IN PROGRESS FOR 5 DAYS    REPTSTATUS PENDING 06/11/2022 1449    Cardiac Enzymes: Recent Labs  Lab 06/10/22 0817  CKTOTAL 128   CBG: Recent Labs  Lab 06/13/22 1131 06/13/22 1650 06/13/22 2320 06/14/22 0730 06/14/22 1124  GLUCAP 263* 305* 218* 216* 229*   Iron Studies: No results for input(s): "IRON", "TIBC", "TRANSFERRIN", "FERRITIN" in the last 72 hours. '@lablastinr3'$ @ Studies/Results: No results found. Medications:  ampicillin-sulbactam (UNASYN) IV 3 g (06/13/22 1512)   vancomycin      (feeding supplement) PROSource Plus  30 mL Oral TID BM   vitamin C  1,000 mg Oral Daily   atorvastatin  40 mg Oral Daily   calcitRIOL  1.25 mcg Oral Q M,W,F-HD   carvedilol  25 mg Oral BID WC   Chlorhexidine Gluconate Cloth  6 each Topical Q0600  darbepoetin (ARANESP) injection - DIALYSIS  200 mcg Subcutaneous Q Mon-1800   docusate sodium  100 mg Oral Daily   feeding supplement (NEPRO CARB STEADY)  237 mL Oral BID BM   heparin injection (subcutaneous)  5,000 Units Subcutaneous Q8H   insulin aspart  0-15 Units Subcutaneous TID WC   insulin aspart  0-5 Units Subcutaneous QHS   insulin glargine-yfgn  6 Units Subcutaneous Daily   irbesartan  150 mg Oral Daily   lactobacillus  1 g Oral TID WC   multivitamin  1 tablet Oral QHS   nutrition supplement (JUVEN)  1 packet Oral BID BM   pantoprazole  40 mg Oral Daily   sucroferric oxyhydroxide  1,000 mg Oral TID WC   torsemide  100 mg Oral Once per day on Sun Tue Thu Sat   zinc sulfate  220 mg Oral Daily     Dialysis Orders: [facility] MWF 4h 71mn  400/800   108.5kg   2/2 bath  LFA AVF   - No Heparin  - rocaltrol 1.25 mcg po tiw - mircera 225 mcg IV  q 2wks, last 2/12, due 2/26 - last Hb 8.6 on 2/14   Assessment/Plan: 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 IV antibiotics per primary.  Hyponatremia - worsening in the  setting of hypervolemia. Corrected Na 126 for hyperglycemia.  ESRD - on HD MWF. Ran on T,Th,S schedule last week. Resume MWF. Next HD 06/14/2022 HTN/ volume - Remains very much above OP EDW with hyponatremia. Decrease irbesartan to 75 mg PO daily  to allow for more volume to be removed. HD again today run sequential.  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 Next ESA due 2/26.  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 Cont po vdra and velphoro as binder.  DM2 per pmd. Chronic hyperglycemia, poorly controlled DMT2.  Nutrition - hypoalbuminemia- Continue protein supplement. Fluid restriction discussed with patient.     Virat Prather H. Shatina Streets NP-C 06/14/2022, 11:33 AM  [employer]ell [phone]

## 2022-06-14 NOTE — Progress Notes (Signed)
Inpatient Rehab Admissions Coordinator:   Will start insurance auth request today.   Shann Medal, PT, DPT Admissions Coordinator [phone] 06/14/22  11:17 AM

## 2022-06-14 NOTE — Evaluation (Signed)
Occupational Therapy Evaluation Patient Details Name: Scott Castillo MRN: [id number] DOB: [birth year] Today's Date: 06/14/2022   History of Present Illness Pt is 63 year old presented to [facility] on  06/09/22 with lt foot infection. 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 PMH - ESRD on HD, HTN, DM, neuropathy, retinopathy, pancreatic CA   Clinical Impression   Prior to this admission, patient working at [employer], driving, and independent with ADLs with the exception of assist for lower body dressing. Patient presenting with weakness, deficits in balance and stability, and need for increased assist to complete all ADLs. Currently, patient is a min A for bed mobility, and mod A of 2 to come into standing. Patient able to complete 4 sit<>stands with mod A of 2 with PTA. Patient mod to max A for ADLs, especially with regard to peri-care and lower body dressing. Due to prior level of function,  OT recommending AIR placement. OT will continue to follow.      Recommendations for follow up therapy are one component of a multi-disciplinary discharge planning process, led by the attending physician.  Recommendations may be updated based on patient status, additional functional criteria and insurance authorization.   Follow Up Recommendations  Acute inpatient rehab (3hours/day)     Assistance Recommended at Discharge Frequent or constant Supervision/Assistance  Patient can return home with the following A lot of help with walking and/or transfers;A lot of help with bathing/dressing/bathroom;Assist for transportation;Help with stairs or ramp for entrance    Functional Status Assessment  Patient has had a recent decline in their functional status and demonstrates the ability to make significant improvements in function in a reasonable and predictable amount of time.  Equipment Recommendations  Other (comment) (Defer to next venue)    Recommendations for Other Services Rehab consult     Precautions / Restrictions  Precautions Precautions: Fall      Mobility Bed Mobility Overal bed mobility: Needs Assistance Bed Mobility: Supine to Sit     Supine to sit: Min assist     General bed mobility comments: Assist to elevate trunk into sitting    Transfers Overall transfer level: Needs assistance Equipment used: Rolling walker (2 wheels) Transfers: Sit to/from Stand Sit to Stand: Mod assist, From elevated surface, Max assist, +2 safety/equipment, +2 physical assistance           General transfer comment: Heavy assist to bring hips up from bed on initial stand (mod of 2) increased assist to complete and come into midline, then completing 3 additional sit<>stands with mod A of 2 then completing stand pivot transfer with mod A of 2 to recliner      Balance Overall balance assessment: Needs assistance Sitting-balance support: No upper extremity supported, Feet supported Sitting balance-Leahy Scale: Fair     Standing balance support: Bilateral upper extremity supported, During functional activity, Reliant on assistive device for balance Standing balance-Leahy Scale: Poor Standing balance comment: reliant on RW                           ADL either performed or assessed with clinical judgement   ADL Overall ADL's : Needs assistance/impaired Eating/Feeding: Set up;Sitting   Grooming: Set up;Bed level   Upper Body Bathing: Min guard;Sitting   Lower Body Bathing: Maximal assistance;Total assistance;Sitting/lateral leans;Sit to/from stand   Upper Body Dressing : Min guard;Sitting   Lower Body Dressing: Maximal assistance;Total assistance;Sitting/lateral leans;Sit to/from stand   Toilet Transfer: Moderate assistance;+2 for physical assistance;+2 for  safety/equipment;Stand-pivot;Rolling walker (2 wheels) Toilet Transfer Details (indicate cue type and reason): heavy mod A of 2 initially, but able to complete 4 sit<> stands at [place] and Hygiene: Total  assistance;Sit to/from stand Toileting - Clothing Manipulation Details (indicate cue type and reason): unaware of BM     Functional mobility during ADLs: Moderate assistance;+2 for physical assistance;+2 for safety/equipment;Cueing for safety;Cueing for sequencing;Rolling walker (2 wheels) General ADL Comments: Patient presenting with weakness, deficits in balance and stability, and need for increased assist to complete all ADLs     Vision Baseline Vision/History: 1 Wears glasses (Readers) Ability to See in Adequate Light: 0 Adequate Patient Visual Report: No change from baseline Vision Assessment?: No apparent visual deficits     Perception     Praxis      Pertinent Vitals/Pain Pain Assessment Pain Assessment: No/denies pain     Hand Dominance Right   Extremity/Trunk Assessment Upper Extremity Assessment Upper Extremity Assessment: Generalized weakness (arthritis in joints)   Lower Extremity Assessment Lower Extremity Assessment: Defer to PT evaluation   Cervical / Trunk Assessment Cervical / Trunk Assessment: Normal   Communication Communication Communication: No difficulties   Cognition Arousal/Alertness: Awake/alert Behavior During Therapy: WFL for tasks assessed/performed Overall Cognitive Status: Within Functional Limits for tasks assessed                                       General Comments       Exercises     Shoulder Instructions      Home Living Family/patient expects to be discharged to:: Private residence Living Arrangements: Spouse/significant other Available Help at Discharge: Family Type of Home: Mobile home Home Access: Stairs to enter Technical brewer of Steps: 4 Entrance Stairs-Rails: Right;Left;Can reach both Home Layout: One level     Bathroom Shower/Tub: Teacher, early years/pre: Handicapped height Bathroom Accessibility: No   Home Equipment: Conservation officer, nature (2 wheels);Rollator (4  wheels);BSC/3in1;Grab bars - tub/shower;Cane - quad   Additional Comments: Wife thinks front door and bathroom door may be too narrow for w/c      Prior Functioning/Environment Prior Level of Function : Independent/Modified Independent             Mobility Comments: Amb with use of quad cane or walker ADLs Comments: independent with the exception of lower body dressing, where his wife would assist        OT Problem List: Decreased strength;Decreased range of motion;Decreased activity tolerance;Impaired balance (sitting and/or standing);Decreased coordination;Decreased cognition;Decreased safety awareness;Decreased knowledge of use of DME or AE;Decreased knowledge of precautions;Impaired sensation;Increased edema      OT Treatment/Interventions: Self-care/ADL training;Therapeutic exercise;Neuromuscular education;Energy conservation;DME and/or AE instruction;Manual therapy;Therapeutic activities;Patient/family education;Balance training    OT Goals(Current goals can be found in the care plan section) Acute Rehab OT Goals Patient Stated Goal: to get better OT Goal Formulation: With patient Time For Goal Achievement: 01/24/23 Potential to Achieve Goals: Good ADL Goals Pt Will Perform Lower Body Bathing: with min assist;with adaptive equipment;sitting/lateral leans;sit to/from stand Pt Will Perform Lower Body Dressing: sitting/lateral leans;sit to/from stand;with min assist;with adaptive equipment Pt Will Transfer to Toilet: with mod assist;stand pivot transfer;bedside commode Additional ADL Goal #1: Patient will demonstrate increased activity tolerance to complete functional task in standing for 2 minutes prior to fatigue and need for seated rest break.  OT Frequency: Min 2X/week    Co-evaluation  AM-PAC OT "6 Clicks" Daily Activity     Outcome Measure Help from another person eating meals?: A Little Help from another person taking care of personal grooming?: A  Little Help from another person toileting, which includes using toliet, bedpan, or urinal?: A Lot Help from another person bathing (including washing, rinsing, drying)?: A Lot Help from another person to put on and taking off regular upper body clothing?: A Little Help from another person to put on and taking off regular lower body clothing?: Total 6 Click Score: 14   End of Session Equipment Utilized During Treatment: Gait belt;Rolling walker (2 wheels) Nurse Communication: Mobility status  Activity Tolerance: Patient tolerated treatment well Patient left: in chair;with call bell/phone within reach;with chair alarm set  OT Visit Diagnosis: Unsteadiness on feet (R26.81);Other abnormalities of gait and mobility (R26.89);Repeated falls (R29.6);Muscle weakness (generalized) (M62.81);History of falling (Z91.81)                Time: [id number] OT Time Calculation (min): 29 min Charges:  OT General Charges $OT Visit: 1 Visit OT Evaluation $OT Eval Moderate Complexity: 1 Mod  Corinne Ports E. Jamorris Ndiaye, OTR/L Acute Rehabilitation Services [phone]   Ascencion Dike 06/14/2022, 10:41 AM

## 2022-06-14 NOTE — Progress Notes (Signed)
[facility] Progress Note Patient: Scott Castillo [id number] DOB: Aug 31, 1960 DOA: 06/08/2022  DOS: the patient was seen and examined on 06/14/2022  Brief hospital course: PMH of type II DM, HTN, ESRD on HD MWF, neuropathy, nephropathy, retinopathy, nonhealing ulcers on the leg seen by podiatry. Present to the hospital with complaints of worsening wound and drainage ongoing since December 2023.  Was initially sent to current of hospital ER from podiatry office-[facility]. 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  Orthopedic consulted.  Most likely will require surgical management tentatively on Friday.  Nephrology also following for HD needs.  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 Infection from gas-forming organism. No blood cultures performed before initiation of antibiotic here in the hospital.  Cultures so far negative blood. ABI unable to compress the vasculature. Orthopedic consulted.  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 Culture sent.  Monitor. Initially started on Vanco Flagyl and Rocephin. I changed to Short.  Currently on Zosyn.  Wound culture growing Staph aureus.  Sensitive to oxacillin. Discontinue vancomycin.  Continue IV Unasyn in the hospital.  Switch to oral Augmentin on discharge for total of 3-week therapy after surgery.   Concerns with heparin. See note from 2/24. Extensive discussion with the patient. Patient thought that the heparin was responsible for episodes of delirium that was thought to be secondary to dialysis disequilibrium syndrome in 2022. Currently patient agreeable for heparin for DVT prophylaxis given his risk for DVT is higher. Will monitor.   Right hip pain. X-ray unremarkable. Monitor.   ESRD on HD MWF. Nephrology consulted. Appreciate nephrology assistance for HD in the hospital.   HTN. Blood pressure  stable. Continue home regimen.   Type 2 diabetes mellitus with ESRD and neuropathy with nonhealing wound, uncontrolled with hyperglycemia. Currently on sliding scale insulin.   Obesity Body mass index is 34.15 kg/m.  Placing the pt at higher risk of poor outcomes.  Left heel unstageable pressure injury present on admission. Medial stage II coccyx pressure injury present on admission. Continue foam dressing wound coccyx.  Subjective: Is any acute complaint.  No nausea vomiting no fever no chills.  Bowels are moving.  No blood in the stool.  Physical Exam: General: in Mild distress, No Rash Cardiovascular: S1 and S2 Present, No Murmur Respiratory: Good respiratory effort, Bilateral Air entry present. No Crackles, No wheezes Abdomen: Bowel Sound present, No tenderness Extremities: Left BKA, no edema Neuro: Alert and oriented x3, no new focal deficit   Data Reviewed: I have Reviewed nursing notes, Vitals, and Lab results. Since last encounter, pertinent lab results BMP   . I have ordered test including CBC and BMP  .    Disposition: Status is: Inpatient Remains inpatient appropriate because: Awaiting CIR.  heparin injection 5,000 Units Start: 06/13/22 1400 SCD's Start: 06/11/22 1706   Family Communication: No one at bedside. Level of care: Med-Surg  Vitals:   06/14/22 1700 06/14/22 1704 06/14/22 1712 06/14/22 1804  BP: 115/71 137/87  130/84  Pulse: 85 89  79  Resp: (!) '26 20  18  '$ Temp:  98.6 F (37 C)  98 F (36.7 C)  TempSrc:  Oral    SpO2: 98% 97%  95%  Weight:  114.2 kg 114.2 kg   Height:         Author: Berle Mull, MD 06/14/2022 7:32 PM  Please look on [email or web address] to find out who is on  call.

## 2022-06-14 NOTE — TOC Progression Note (Signed)
Transition of Care [facility]) - Progression Note    Patient Details  Name: Scott Castillo MRN: [id number] Date of Birth: 29-Jan-1961  [facility] CM/SW Contact  Tom-Johnson, Renea Ee, RN Phone Number: 06/14/2022, 4:22 PM  Clinical Narrative:     CIR recommended by PT/OT. Awaiting Insurance auth.  CM will continue to follow a patient progresses with care towards discharge.     Barriers to Discharge: Continued Medical Work up  Expected Discharge Plan and Services   Discharge Planning Services: CM Consult   Living arrangements for the past 2 months: Single Family Home                                       Social Determinants of Health (SDOH) Interventions SDOH Screenings   Food Insecurity: No Food Insecurity (06/08/2022)  Housing: Low Risk  (06/08/2022)  Transportation Needs: No Transportation Needs (06/08/2022)  Utilities: Not At Risk (06/08/2022)  Tobacco Use: Low Risk  (06/14/2022)    Readmission Risk Interventions    06/19/2020    3:04 PM  Readmission Risk Prevention Plan  Transportation Screening Complete  PCP or Specialist Appt within 3-5 Days Complete  HRI or [place] Complete  Social Work Consult for [place] Planning/Counseling Complete  Palliative Care Screening Complete  Medication Review Press photographer) Referral to Pharmacy

## 2022-06-14 NOTE — Progress Notes (Signed)
Physical Therapy Treatment Patient Details Name: Scott Castillo MRN: [id number] DOB: 28-Jun-1960 Today's Date: 06/14/2022   History of Present Illness Pt is 62 year old presented [facility] on  06/09/22 with lt foot infection. 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 PMH - ESRD on HD, HTN, DM, neuropathy, retinopathy, pancreatic CA    PT Comments    Pt with continued progress towards acute goals. Seen with OT for first transfer OOB with pt able to stand pivot to recliner with RW support and max A +2 to manage RW and steadying assist throughout trasnfer. Pt able to come to stand with up to mod A +2 from elevated EOB prior to transfer x4 trials and maintain static standing with min A, however pt continues to fatigue quickly. Pt continues to be limited by weakness and poor balance/postural reactions. Current plan remains appropriate to address deficits and maximize functional independence and decrease caregiver burden. Pt continues to benefit from skilled PT services to progress toward functional mobility goals.     Recommendations for follow up therapy are one component of a multi-disciplinary discharge planning process, led by the attending physician.  Recommendations may be updated based on patient status, additional functional criteria and insurance authorization.  Follow Up Recommendations  Acute inpatient rehab (3hours/day)     Assistance Recommended at Discharge Frequent or constant Supervision/Assistance  Patient can return home with the following A lot of help with walking and/or transfers;Assist for transportation;Help with stairs or ramp for entrance   Equipment Recommendations  Wheelchair (measurements PT);Wheelchair cushion (measurements PT)    Recommendations for Other Services       Precautions / Restrictions Precautions Precautions: Fall (.) Restrictions Weight Bearing Restrictions: Yes LLE Weight Bearing: Non weight bearing     Mobility  Bed Mobility Overal bed mobility: Needs  Assistance Bed Mobility: Supine to Sit     Supine to sit: Min guard     General bed mobility comments: min gaurd for safety    Transfers Overall transfer level: Needs assistance Equipment used: Rolling walker (2 wheels) Transfers: Sit to/from Stand, Bed to chair/wheelchair/BSC Sit to Stand: Min assist, Mod assist, +2 physical assistance, From elevated surface Stand pivot transfers: Max assist, +2 physical assistance         General transfer comment: heavy mod assist +2 for initial stand with pt unable to shift weight forward and extend trunk and tuck hips to come fully uproght, progressing to min a +1 to stand full upright from elevated EOB x4 trials, pt flexing trunk with fatigue, max a +2 to guide hips and steady to pivot to sit in recliner, pt attempting to hop but unable to clear foot    Ambulation/Gait             Pre-gait activities: statis standing with weight shift to L to find midline     Stairs             Wheelchair Mobility    Modified Rankin (Stroke Patients Only)       Balance Overall balance assessment: Needs assistance Sitting-balance support: No upper extremity supported, Feet supported Sitting balance-Leahy Scale: Fair     Standing balance support: Bilateral upper extremity supported, During functional activity, Reliant on assistive device for balance (.) Standing balance-Leahy Scale: Poor Standing balance comment: walker and min assist for static standing                            Cognition Arousal/Alertness: Awake/alert  Behavior During Therapy: WFL for tasks assessed/performed Overall Cognitive Status: Within Functional Limits for tasks assessed                                          Exercises      General Comments        Pertinent Vitals/Pain Pain Assessment Pain Assessment: Faces Faces Pain Scale: Hurts a little bit Pain Location: LLE Pain Descriptors / Indicators: Sore Pain  Intervention(s): Limited activity within patient's tolerance, Monitored during session    Home Living Family/patient expects to be discharged to:: Private residence Living Arrangements: Spouse/significant other Available Help at Discharge: Family Type of Home: Mobile home Home Access: Stairs to enter Entrance Stairs-Rails: Right;Left;Can reach both Entrance Stairs-Number of Steps: 4   Home Layout: One level Home Equipment: Conservation officer, nature (2 wheels);Rollator (4 wheels);BSC/3in1;Grab bars - tub/shower;Cane - quad Additional Comments: Wife thinks front door and bathroom door may be too narrow for w/c    Prior Function            PT Goals (current goals can now be found in the care plan section) Acute Rehab PT Goals Patient Stated Goal: return home PT Goal Formulation: With patient Time For Goal Achievement: 06/27/22 Potential to Achieve Goals: Good Progress towards PT goals: Progressing toward goals    Frequency    Min 3X/week      PT Plan      Co-evaluation PT/OT/SLP Co-Evaluation/Treatment: Yes Reason for Co-Treatment: For patient/therapist safety;To address functional/ADL transfers PT goals addressed during session: Balance;Proper use of DME;Mobility/safety with mobility        AM-PAC PT "6 Clicks" Mobility   Outcome Measure  Help needed turning from your back to your side while in a flat bed without using bedrails?: A Little Help needed moving from lying on your back to sitting on the side of a flat bed without using bedrails?: A Little Help needed moving to and from a bed to a chair (including a wheelchair)?: Total Help needed standing up from a chair using your arms (e.g., wheelchair or bedside chair)?: A Little Help needed to walk in hospital room?: Total Help needed climbing 3-5 steps with a railing? : Total 6 Click Score: 12    End of Session Equipment Utilized During Treatment: Gait belt Activity Tolerance: Patient tolerated treatment well Patient  left: with call bell/phone within reach;in chair;with chair alarm set Nurse Communication: Mobility status;Need for lift equipment (stedy for trasnfer back to bed) PT Visit Diagnosis: Other abnormalities of gait and mobility (R26.89);Difficulty in walking, not elsewhere classified (R26.2)     Time: [id number] PT Time Calculation (min) (ACUTE ONLY): 30 min  Charges:  $Therapeutic Activity: 8-22 mins                     Lilyian Quayle R. PTA Acute Rehabilitation Services Office: [place] 06/14/2022, 10:46 AM

## 2022-06-14 NOTE — Inpatient Diabetes Management (Signed)
Inpatient Diabetes Program Recommendations  AACE/ADA: New Consensus Statement on Inpatient Glycemic Control (2015)  Target Ranges:  Prepandial:   less than 140 mg/dL      Peak postprandial:   less than 180 mg/dL (1-2 hours)      Critically ill patients:  140 - 180 mg/dL   Lab Results  Component Value Date   GLUCAP 216 (H) 06/14/2022   HGBA1C 7.4 (H) 06/08/2022    Latest Reference Range & Units 06/12/22 13:23 06/12/22 16:11 06/12/22 20:55 06/13/22 07:22 06/13/22 11:31 06/13/22 16:50 06/13/22 23:20 06/14/22 07:30  Glucose-Capillary 70 - 99 mg/dL 177 (H) 257 (H) 281 (H) 216 (H) 263 (H) 305 (H) 218 (H) 216 (H)  (H): Data is abnormally high  Diabetes history: DM2 Outpatient Diabetes medications: Amaryl 2 mg daily, Rybelsus 14 mg daily Current orders for Inpatient glycemic control: Semglee 6 units qd, Novolog 0-6 units TID with meals, Novolog 0-5 units QHS  Inpatient Diabetes Program Recommendations:   Please consider: -Novolog 2 units TID with meals for meal coverage if patient eats at least 50% of meals.   Thank you, [name]. Angelic Schnelle, RN, MSN, CDE  Diabetes Coordinator Inpatient Glycemic Control Team Team Pager [phone] (8am-5pm) 06/14/2022 7:37 AM

## 2022-06-15 ENCOUNTER — Inpatient Hospital Stay (HOSPITAL_COMMUNITY)
Admission: RE | Admit: 2022-06-15 | Discharge: 2022-07-08 | DRG: 981 | Disposition: A | Discharge status: Skilled Nursing Facility | Payer: Medicare Other | Source: Intra-hospital | Attending: Physical Medicine & Rehabilitation | Admitting: Physical Medicine & Rehabilitation

## 2022-06-15 ENCOUNTER — Other Ambulatory Visit: Payer: Self-pay

## 2022-06-15 ENCOUNTER — Other Ambulatory Visit (HOSPITAL_COMMUNITY): Payer: Self-pay

## 2022-06-15 ENCOUNTER — Encounter (HOSPITAL_COMMUNITY): Payer: Self-pay | Admitting: Physical Medicine & Rehabilitation

## 2022-06-15 ENCOUNTER — Inpatient Hospital Stay (HOSPITAL_COMMUNITY): Payer: Medicare Other

## 2022-06-15 DIAGNOSIS — E669 Obesity, unspecified: Secondary | ICD-10-CM | POA: Diagnosis present

## 2022-06-15 DIAGNOSIS — E785 Hyperlipidemia, unspecified: Secondary | ICD-10-CM | POA: Diagnosis present

## 2022-06-15 DIAGNOSIS — Z89512 Acquired absence of left leg below knee: Secondary | ICD-10-CM | POA: Diagnosis present

## 2022-06-15 DIAGNOSIS — M869 Osteomyelitis, unspecified: Secondary | ICD-10-CM | POA: Diagnosis present

## 2022-06-15 DIAGNOSIS — E8809 Other disorders of plasma-protein metabolism, not elsewhere classified: Secondary | ICD-10-CM | POA: Diagnosis present

## 2022-06-15 DIAGNOSIS — G546 Phantom limb syndrome with pain: Secondary | ICD-10-CM | POA: Diagnosis present

## 2022-06-15 DIAGNOSIS — S88112A Complete traumatic amputation at level between knee and ankle, left lower leg, initial encounter: Principal | ICD-10-CM

## 2022-06-15 DIAGNOSIS — Z992 Dependence on renal dialysis: Secondary | ICD-10-CM

## 2022-06-15 DIAGNOSIS — L89616 Pressure-induced deep tissue damage of right heel: Secondary | ICD-10-CM | POA: Diagnosis not present

## 2022-06-15 DIAGNOSIS — E8770 Fluid overload, unspecified: Secondary | ICD-10-CM | POA: Diagnosis present

## 2022-06-15 DIAGNOSIS — L02612 Cutaneous abscess of left foot: Secondary | ICD-10-CM | POA: Diagnosis present

## 2022-06-15 DIAGNOSIS — I120 Hypertensive chronic kidney disease with stage 5 chronic kidney disease or end stage renal disease: Secondary | ICD-10-CM | POA: Diagnosis present

## 2022-06-15 DIAGNOSIS — D631 Anemia in chronic kidney disease: Secondary | ICD-10-CM | POA: Diagnosis present

## 2022-06-15 DIAGNOSIS — I872 Venous insufficiency (chronic) (peripheral): Secondary | ICD-10-CM | POA: Diagnosis present

## 2022-06-15 DIAGNOSIS — T82510D Breakdown (mechanical) of surgically created arteriovenous fistula, subsequent encounter: Secondary | ICD-10-CM

## 2022-06-15 DIAGNOSIS — M25551 Pain in right hip: Secondary | ICD-10-CM | POA: Diagnosis present

## 2022-06-15 DIAGNOSIS — T82898A Other specified complication of vascular prosthetic devices, implants and grafts, initial encounter: Secondary | ICD-10-CM | POA: Insufficient documentation

## 2022-06-15 DIAGNOSIS — D638 Anemia in other chronic diseases classified elsewhere: Secondary | ICD-10-CM | POA: Diagnosis present

## 2022-06-15 DIAGNOSIS — L089 Local infection of the skin and subcutaneous tissue, unspecified: Secondary | ICD-10-CM | POA: Diagnosis not present

## 2022-06-15 DIAGNOSIS — R2689 Other abnormalities of gait and mobility: Secondary | ICD-10-CM | POA: Diagnosis present

## 2022-06-15 DIAGNOSIS — E871 Hypo-osmolality and hyponatremia: Secondary | ICD-10-CM | POA: Diagnosis present

## 2022-06-15 DIAGNOSIS — N185 Chronic kidney disease, stage 5: Secondary | ICD-10-CM | POA: Diagnosis not present

## 2022-06-15 DIAGNOSIS — Y712 Prosthetic and other implants, materials and accessory cardiovascular devices associated with adverse incidents: Secondary | ICD-10-CM | POA: Diagnosis not present

## 2022-06-15 DIAGNOSIS — D72829 Elevated white blood cell count, unspecified: Secondary | ICD-10-CM | POA: Diagnosis not present

## 2022-06-15 DIAGNOSIS — N186 End stage renal disease: Secondary | ICD-10-CM | POA: Diagnosis present

## 2022-06-15 DIAGNOSIS — Z79899 Other long term (current) drug therapy: Secondary | ICD-10-CM

## 2022-06-15 DIAGNOSIS — R052 Subacute cough: Secondary | ICD-10-CM | POA: Diagnosis not present

## 2022-06-15 DIAGNOSIS — R059 Cough, unspecified: Secondary | ICD-10-CM | POA: Diagnosis present

## 2022-06-15 DIAGNOSIS — E1122 Type 2 diabetes mellitus with diabetic chronic kidney disease: Secondary | ICD-10-CM | POA: Diagnosis present

## 2022-06-15 DIAGNOSIS — I10 Essential (primary) hypertension: Secondary | ICD-10-CM | POA: Diagnosis not present

## 2022-06-15 DIAGNOSIS — Z6830 Body mass index (BMI) 30.0-30.9, adult: Secondary | ICD-10-CM

## 2022-06-15 DIAGNOSIS — E1165 Type 2 diabetes mellitus with hyperglycemia: Secondary | ICD-10-CM | POA: Diagnosis present

## 2022-06-15 DIAGNOSIS — R34 Anuria and oliguria: Secondary | ICD-10-CM | POA: Diagnosis present

## 2022-06-15 DIAGNOSIS — Z7984 Long term (current) use of oral hypoglycemic drugs: Secondary | ICD-10-CM

## 2022-06-15 DIAGNOSIS — E11628 Type 2 diabetes mellitus with other skin complications: Secondary | ICD-10-CM | POA: Diagnosis not present

## 2022-06-15 DIAGNOSIS — E1169 Type 2 diabetes mellitus with other specified complication: Secondary | ICD-10-CM | POA: Diagnosis present

## 2022-06-15 DIAGNOSIS — R0989 Other specified symptoms and signs involving the circulatory and respiratory systems: Secondary | ICD-10-CM | POA: Diagnosis not present

## 2022-06-15 DIAGNOSIS — Z1152 Encounter for screening for COVID-19: Secondary | ICD-10-CM | POA: Diagnosis not present

## 2022-06-15 DIAGNOSIS — R32 Unspecified urinary incontinence: Secondary | ICD-10-CM | POA: Diagnosis present

## 2022-06-15 DIAGNOSIS — M79651 Pain in right thigh: Secondary | ICD-10-CM | POA: Diagnosis not present

## 2022-06-15 DIAGNOSIS — M898X9 Other specified disorders of bone, unspecified site: Secondary | ICD-10-CM | POA: Diagnosis present

## 2022-06-15 DIAGNOSIS — Z888 Allergy status to other drugs, medicaments and biological substances status: Secondary | ICD-10-CM

## 2022-06-15 DIAGNOSIS — M542 Cervicalgia: Secondary | ICD-10-CM | POA: Diagnosis not present

## 2022-06-15 LAB — GLUCOSE, CAPILLARY
Glucose-Capillary: 193 mg/dL — ABNORMAL HIGH (ref 70–99)
Glucose-Capillary: 288 mg/dL — ABNORMAL HIGH (ref 70–99)
Glucose-Capillary: 271 mg/dL — ABNORMAL HIGH (ref 70–99)
Glucose-Capillary: 284 mg/dL — ABNORMAL HIGH (ref 70–99)

## 2022-06-15 LAB — RENAL FUNCTION PANEL
Albumin: 1.6 g/dL — ABNORMAL LOW (ref 3.5–5.0)
Anion gap: 16 — ABNORMAL HIGH (ref 5–15)
BUN: 59 mg/dL — ABNORMAL HIGH (ref 6–20)
CO2: 21 mmol/L — ABNORMAL LOW (ref 22–32)
Calcium: 9 mg/dL (ref 8.9–10.3)
Chloride: 90 mmol/L — ABNORMAL LOW (ref 98–111)
Creatinine, Ser: 6.28 mg/dL — ABNORMAL HIGH (ref 0.61–1.24)
GFR, Estimated: 10 mL/min — ABNORMAL LOW (ref >60)
Glucose, Bld: 198 mg/dL — ABNORMAL HIGH (ref 70–99)
Phosphorus: 5 mg/dL — ABNORMAL HIGH (ref 2.5–4.6)
Potassium: 3.9 mmol/L (ref 3.5–5.1)
Sodium: 127 mmol/L — ABNORMAL LOW (ref 135–145)

## 2022-06-15 LAB — CBC
HCT: 26.8 % — ABNORMAL LOW (ref 39.0–52.0)
Hemoglobin: 8.5 g/dL — ABNORMAL LOW (ref 13.0–17.0)
MCH: 27.8 pg (ref 26.0–34.0)
MCHC: 31.7 g/dL (ref 30.0–36.0)
MCV: 87.6 fL (ref 80.0–100.0)
Platelets: 252 10*3/uL (ref 150–400)
RBC: 3.06 MIL/uL — ABNORMAL LOW (ref 4.22–5.81)
RDW: 17.6 % — ABNORMAL HIGH (ref 11.5–15.5)
WBC: 16.7 10*3/uL — ABNORMAL HIGH (ref 4.0–10.5)
nRBC: 0 % (ref 0.0–0.2)

## 2022-06-15 LAB — MAGNESIUM: Magnesium: 2 mg/dL (ref 1.7–2.4)

## 2022-06-15 LAB — SURGICAL PATHOLOGY

## 2022-06-15 MED ORDER — CALCITRIOL 0.25 MCG PO CAPS
1.2500 ug | ORAL_CAPSULE | ORAL | Status: DC
  Administered 2022-06-16 – 2022-07-07 (×6): 1.25 ug via ORAL
  Filled 2022-06-15 (×8): qty 5

## 2022-06-15 MED ORDER — AMOXICILLIN-POT CLAVULANATE 500-125 MG PO TABS
1.0000 | ORAL_TABLET | Freq: Every day | ORAL | Status: DC
  Administered 2022-06-16 – 2022-06-17 (×2): 1 via ORAL
  Filled 2022-06-15 (×2): qty 1

## 2022-06-15 MED ORDER — LIDOCAINE HCL (PF) 1 % IJ SOLN: 5.0000 mL | INTRAMUSCULAR | Status: DC | PRN

## 2022-06-15 MED ORDER — OXYCODONE HCL 5 MG PO TABS: 10.0000 mg | ORAL_TABLET | ORAL | Status: DC | PRN

## 2022-06-15 MED ORDER — PROCHLORPERAZINE EDISYLATE 10 MG/2ML IJ SOLN: 5.0000 mg | Freq: Four times a day (QID) | INTRAMUSCULAR | Status: DC | PRN

## 2022-06-15 MED ORDER — POLYETHYLENE GLYCOL 3350 17 GM/SCOOP PO POWD
17.0000 g | Freq: Every day | ORAL | 0 refills | Status: DC | PRN
  Filled 2022-06-15: qty 238, 14d supply, fill #0

## 2022-06-15 MED ORDER — JUVEN PO PACK
1.0000 | PACK | Freq: Two times a day (BID) | ORAL | Status: DC
  Administered 2022-06-16 – 2022-06-22 (×10): 1 via ORAL
  Filled 2022-06-15 (×10): qty 1

## 2022-06-15 MED ORDER — VITAMIN C 500 MG PO TABS
1000.0000 mg | ORAL_TABLET | Freq: Every day | ORAL | Status: DC
  Administered 2022-06-16 – 2022-07-08 (×22): 1000 mg via ORAL
  Filled 2022-06-15 (×24): qty 2

## 2022-06-15 MED ORDER — POLYETHYLENE GLYCOL 3350 17 G PO PACK: 17.0000 g | PACK | Freq: Every day | ORAL | Status: DC | PRN

## 2022-06-15 MED ORDER — INSULIN ASPART 100 UNIT/ML IJ SOLN: 0.0000 [IU] | Freq: Three times a day (TID) | INTRAMUSCULAR | Status: DC

## 2022-06-15 MED ORDER — ALTEPLASE 2 MG IJ SOLR: 2.0000 mg | Freq: Once | INTRAMUSCULAR | Status: DC | PRN

## 2022-06-15 MED ORDER — CHLORHEXIDINE GLUCONATE CLOTH 2 % EX PADS
6.0000 | MEDICATED_PAD | Freq: Every day | CUTANEOUS | Status: DC
  Administered 2022-06-16: 6 via TOPICAL

## 2022-06-15 MED ORDER — NEPRO/CARBSTEADY PO LIQD
237.0000 mL | Freq: Two times a day (BID) | ORAL | 0 refills | Status: DC
  Filled 2022-06-15: qty 10000, 21d supply, fill #0

## 2022-06-15 MED ORDER — PROSOURCE PLUS PO LIQD: 30.0000 mL | Freq: Three times a day (TID) | ORAL | 0 refills | Status: DC

## 2022-06-15 MED ORDER — ORAL CARE MOUTH RINSE: 15.0000 mL | OROMUCOSAL | Status: DC | PRN

## 2022-06-15 MED ORDER — GUAIFENESIN-DM 100-10 MG/5ML PO SYRP: 5.0000 mL | ORAL_SOLUTION | Freq: Four times a day (QID) | ORAL | Status: DC | PRN

## 2022-06-15 MED ORDER — PENTAFLUOROPROP-TETRAFLUOROETH EX AERO: 1.0000 | INHALATION_SPRAY | CUTANEOUS | Status: DC | PRN

## 2022-06-15 MED ORDER — SUCROFERRIC OXYHYDROXIDE 500 MG PO CHEW
1000.0000 mg | CHEWABLE_TABLET | Freq: Three times a day (TID) | ORAL | Status: DC
  Administered 2022-06-15 – 2022-07-07 (×63): 1000 mg via ORAL
  Filled 2022-06-15 (×65): qty 2

## 2022-06-15 MED ORDER — PANTOPRAZOLE SODIUM 40 MG PO TBEC
40.0000 mg | DELAYED_RELEASE_TABLET | Freq: Every day | ORAL | Status: DC
  Administered 2022-06-16 – 2022-07-08 (×22): 40 mg via ORAL
  Filled 2022-06-15 (×22): qty 1

## 2022-06-15 MED ORDER — LOPERAMIDE HCL 2 MG PO CAPS
2.0000 mg | ORAL_CAPSULE | ORAL | Status: AC | PRN
  Administered 2022-06-15: 2 mg via ORAL
  Filled 2022-06-15: qty 1

## 2022-06-15 MED ORDER — ZINC SULFATE 220 (50 ZN) MG PO CAPS: 220.0000 mg | ORAL_CAPSULE | Freq: Every day | ORAL | Status: DC

## 2022-06-15 MED ORDER — CALCITRIOL 0.25 MCG PO CAPS: 1.2500 ug | ORAL_CAPSULE | ORAL | Status: DC

## 2022-06-15 MED ORDER — DICLOFENAC SODIUM 1 % EX GEL
2.0000 g | Freq: Three times a day (TID) | CUTANEOUS | Status: DC
  Administered 2022-06-16 – 2022-07-04 (×35): 2 g via TOPICAL
  Filled 2022-06-15: qty 100

## 2022-06-15 MED ORDER — INSULIN GLARGINE-YFGN 100 UNIT/ML ~~LOC~~ SOLN
8.0000 [IU] | Freq: Every day | SUBCUTANEOUS | Status: DC
  Administered 2022-06-16 – 2022-06-18 (×3): 8 [IU] via SUBCUTANEOUS
  Filled 2022-06-15 (×4): qty 0.08

## 2022-06-15 MED ORDER — GUAIFENESIN-DM 100-10 MG/5ML PO SYRP
15.0000 mL | ORAL_SOLUTION | ORAL | Status: DC | PRN
  Administered 2022-06-20 – 2022-07-05 (×8): 15 mL via ORAL
  Filled 2022-06-15 (×9): qty 20

## 2022-06-15 MED ORDER — LIDOCAINE-PRILOCAINE 2.5-2.5 % EX CREA: 1.0000 | TOPICAL_CREAM | CUTANEOUS | Status: DC | PRN

## 2022-06-15 MED ORDER — BISACODYL 10 MG RE SUPP: 10.0000 mg | Freq: Every day | RECTAL | Status: DC | PRN

## 2022-06-15 MED ORDER — GERHARDT'S BUTT CREAM
TOPICAL_CREAM | Freq: Two times a day (BID) | CUTANEOUS | Status: DC
  Filled 2022-06-15 (×2): qty 1

## 2022-06-15 MED ORDER — OXYCODONE HCL 5 MG PO TABS: 5.0000 mg | ORAL_TABLET | ORAL | Status: DC | PRN

## 2022-06-15 MED ORDER — PROCHLORPERAZINE 25 MG RE SUPP: 12.5000 mg | Freq: Four times a day (QID) | RECTAL | Status: DC | PRN

## 2022-06-15 MED ORDER — DARBEPOETIN ALFA 200 MCG/0.4ML IJ SOSY
200.0000 ug | PREFILLED_SYRINGE | INTRAMUSCULAR | Status: DC
  Filled 2022-06-15: qty 0.4

## 2022-06-15 MED ORDER — FLORANEX PO PACK: 1.0000 g | PACK | Freq: Three times a day (TID) | ORAL | Status: DC

## 2022-06-15 MED ORDER — GERHARDT'S BUTT CREAM: 1.0000 | TOPICAL_CREAM | Freq: Two times a day (BID) | CUTANEOUS | Status: DC

## 2022-06-15 MED ORDER — CARVEDILOL 25 MG PO TABS
25.0000 mg | ORAL_TABLET | Freq: Two times a day (BID) | ORAL | Status: DC
  Administered 2022-06-15 – 2022-07-08 (×38): 25 mg via ORAL
  Filled 2022-06-15 (×41): qty 1

## 2022-06-15 MED ORDER — TRAZODONE HCL 50 MG PO TABS: 25.0000 mg | ORAL_TABLET | Freq: Every evening | ORAL | Status: DC | PRN

## 2022-06-15 MED ORDER — TORSEMIDE 20 MG PO TABS
100.0000 mg | ORAL_TABLET | ORAL | Status: DC
  Administered 2022-06-17 – 2022-07-08 (×13): 100 mg via ORAL
  Filled 2022-06-15 (×13): qty 5

## 2022-06-15 MED ORDER — FLORANEX PO PACK
1.0000 g | PACK | Freq: Three times a day (TID) | ORAL | Status: DC
  Administered 2022-06-15 – 2022-07-08 (×63): 1 g via ORAL
  Filled 2022-06-15 (×72): qty 1

## 2022-06-15 MED ORDER — OXYCODONE HCL 5 MG PO TABS
5.0000 mg | ORAL_TABLET | Freq: Four times a day (QID) | ORAL | 0 refills | Status: DC | PRN
  Filled 2022-06-15: qty 20, 5d supply, fill #0

## 2022-06-15 MED ORDER — RENA-VITE PO TABS: 1.0000 | ORAL_TABLET | Freq: Every day | ORAL | 0 refills | Status: DC

## 2022-06-15 MED ORDER — INSULIN ASPART 100 UNIT/ML IJ SOLN
0.0000 [IU] | Freq: Every day | INTRAMUSCULAR | Status: DC
  Administered 2022-06-15: 3 [IU] via SUBCUTANEOUS
  Administered 2022-06-17 – 2022-07-02 (×4): 2 [IU] via SUBCUTANEOUS

## 2022-06-15 MED ORDER — JUVEN PO PACK: 1.0000 | PACK | Freq: Two times a day (BID) | ORAL | 0 refills | Status: DC

## 2022-06-15 MED ORDER — ANTICOAGULANT SODIUM CITRATE 4% (200MG/5ML) IV SOLN: 5.0000 mL | Status: DC | PRN

## 2022-06-15 MED ORDER — IRBESARTAN 75 MG PO TABS
75.0000 mg | ORAL_TABLET | Freq: Every day | ORAL | Status: DC
  Administered 2022-06-16 – 2022-07-08 (×21): 75 mg via ORAL
  Filled 2022-06-15 (×24): qty 1

## 2022-06-15 MED ORDER — INSULIN ASPART 100 UNIT/ML IJ SOLN
0.0000 [IU] | Freq: Three times a day (TID) | INTRAMUSCULAR | Status: DC
  Administered 2022-06-15: 5 [IU] via SUBCUTANEOUS
  Administered 2022-06-16: 1 [IU] via SUBCUTANEOUS
  Administered 2022-06-16: 2 [IU] via SUBCUTANEOUS
  Administered 2022-06-16 – 2022-06-17 (×2): 3 [IU] via SUBCUTANEOUS
  Administered 2022-06-17 (×2): 2 [IU] via SUBCUTANEOUS
  Administered 2022-06-18: 1 [IU] via SUBCUTANEOUS
  Administered 2022-06-18: 2 [IU] via SUBCUTANEOUS
  Administered 2022-06-19: 1 [IU] via SUBCUTANEOUS
  Administered 2022-06-19: 3 [IU] via SUBCUTANEOUS
  Administered 2022-06-19: 1 [IU] via SUBCUTANEOUS
  Administered 2022-06-20: 3 [IU] via SUBCUTANEOUS
  Administered 2022-06-20 – 2022-06-21 (×3): 2 [IU] via SUBCUTANEOUS
  Administered 2022-06-21: 3 [IU] via SUBCUTANEOUS
  Administered 2022-06-21: 2 [IU] via SUBCUTANEOUS
  Administered 2022-06-22: 3 [IU] via SUBCUTANEOUS
  Administered 2022-06-22: 2 [IU] via SUBCUTANEOUS
  Administered 2022-06-22 – 2022-06-23 (×3): 1 [IU] via SUBCUTANEOUS
  Administered 2022-06-24: 2 [IU] via SUBCUTANEOUS
  Administered 2022-06-24 (×2): 1 [IU] via SUBCUTANEOUS
  Administered 2022-06-25: 3 [IU] via SUBCUTANEOUS
  Administered 2022-06-26: 2 [IU] via SUBCUTANEOUS
  Administered 2022-06-26: 1 [IU] via SUBCUTANEOUS
  Administered 2022-06-27: 5 [IU] via SUBCUTANEOUS
  Administered 2022-06-27: 3 [IU] via SUBCUTANEOUS
  Administered 2022-06-28: 1 [IU] via SUBCUTANEOUS
  Administered 2022-06-29 – 2022-06-30 (×3): 2 [IU] via SUBCUTANEOUS
  Administered 2022-06-30 – 2022-07-01 (×3): 1 [IU] via SUBCUTANEOUS
  Administered 2022-07-01: 2 [IU] via SUBCUTANEOUS
  Administered 2022-07-02: 1 [IU] via SUBCUTANEOUS
  Administered 2022-07-03 – 2022-07-04 (×2): 2 [IU] via SUBCUTANEOUS
  Administered 2022-07-04: 3 [IU] via SUBCUTANEOUS
  Administered 2022-07-04 – 2022-07-06 (×4): 1 [IU] via SUBCUTANEOUS
  Administered 2022-07-07: 2 [IU] via SUBCUTANEOUS
  Administered 2022-07-08: 1 [IU] via SUBCUTANEOUS

## 2022-06-15 MED ORDER — INSULIN GLARGINE-YFGN 100 UNIT/ML ~~LOC~~ SOLN
6.0000 [IU] | Freq: Every day | SUBCUTANEOUS | Status: DC
  Filled 2022-06-15: qty 0.06

## 2022-06-15 MED ORDER — ZINC SULFATE 220 (50 ZN) MG PO CAPS
220.0000 mg | ORAL_CAPSULE | Freq: Every day | ORAL | Status: AC
  Administered 2022-06-16 – 2022-06-24 (×9): 220 mg via ORAL
  Filled 2022-06-15 (×10): qty 1

## 2022-06-15 MED ORDER — IRBESARTAN 75 MG PO TABS: 75.0000 mg | ORAL_TABLET | Freq: Every day | ORAL | Status: DC

## 2022-06-15 MED ORDER — ACETAMINOPHEN 325 MG PO TABS
325.0000 mg | ORAL_TABLET | ORAL | Status: DC | PRN
  Administered 2022-06-27 – 2022-07-02 (×2): 650 mg via ORAL
  Filled 2022-06-15 (×2): qty 2

## 2022-06-15 MED ORDER — RENA-VITE PO TABS
1.0000 | ORAL_TABLET | Freq: Every day | ORAL | Status: DC
  Administered 2022-06-15 – 2022-07-06 (×22): 1 via ORAL
  Filled 2022-06-15 (×22): qty 1

## 2022-06-15 MED ORDER — DIPHENHYDRAMINE HCL 12.5 MG/5ML PO ELIX: 12.5000 mg | ORAL_SOLUTION | Freq: Four times a day (QID) | ORAL | Status: DC | PRN

## 2022-06-15 MED ORDER — PHENOL 1.4 % MT LIQD
1.0000 | OROMUCOSAL | Status: DC | PRN
  Administered 2022-06-21: 1 via OROMUCOSAL
  Filled 2022-06-15: qty 177

## 2022-06-15 MED ORDER — INSULIN ASPART 100 UNIT/ML IJ SOLN
2.0000 [IU] | Freq: Three times a day (TID) | INTRAMUSCULAR | Status: DC
  Administered 2022-06-15 – 2022-06-18 (×8): 2 [IU] via SUBCUTANEOUS

## 2022-06-15 MED ORDER — HEPARIN SODIUM (PORCINE) 5000 UNIT/ML IJ SOLN
5000.0000 [IU] | Freq: Three times a day (TID) | INTRAMUSCULAR | Status: DC
  Administered 2022-06-15 – 2022-07-08 (×59): 5000 [IU] via SUBCUTANEOUS
  Filled 2022-06-15 (×60): qty 1

## 2022-06-15 MED ORDER — INSULIN ASPART 100 UNIT/ML IJ SOLN: 0.0000 [IU] | Freq: Every day | INTRAMUSCULAR | Status: DC

## 2022-06-15 MED ORDER — AMOXICILLIN-POT CLAVULANATE 500-125 MG PO TABS
1.0000 | ORAL_TABLET | Freq: Every day | ORAL | 0 refills | Status: DC
  Filled 2022-06-15: qty 19, 19d supply, fill #0

## 2022-06-15 MED ORDER — PROCHLORPERAZINE MALEATE 5 MG PO TABS: 5.0000 mg | ORAL_TABLET | Freq: Four times a day (QID) | ORAL | Status: DC | PRN

## 2022-06-15 MED ORDER — PHENOL 1.4 % MT LIQD
1.0000 | OROMUCOSAL | Status: DC | PRN
  Administered 2022-06-16 – 2022-06-20 (×2): 1 via OROMUCOSAL

## 2022-06-15 MED ORDER — ATORVASTATIN CALCIUM 40 MG PO TABS
40.0000 mg | ORAL_TABLET | Freq: Every day | ORAL | Status: DC
  Administered 2022-06-16 – 2022-07-08 (×22): 40 mg via ORAL
  Filled 2022-06-15 (×24): qty 1

## 2022-06-15 MED ORDER — SODIUM CHLORIDE 0.9 % IV SOLN: 3.0000 g | INTRAVENOUS | Status: DC

## 2022-06-15 NOTE — Progress Notes (Signed)
Transferring patient to CIR 4MW room 11

## 2022-06-15 NOTE — Progress Notes (Signed)
Patient refusing his SSI as feels its excessive. Insulin glargline increased to 8 units and SSI decreased to sensitive scale.

## 2022-06-15 NOTE — Discharge Summary (Signed)
Physician Discharge Summary   Patient: Scott Castillo MRN: [id number] DOB: [birth year]  Admit date:     06/08/2022  Discharge date: 06/15/22  Discharge Physician: Berle Mull  PCP: Algis Greenhouse, MD  Recommendations at discharge:  Follow up with Orthopedics surgery in 1 week   Follow-up Information     Newt Minion, MD Follow up in 1 week(s).   Specialty: Orthopedic Surgery Contact information: [address] [place] [address] [phone]                Discharge Diagnoses: Principal Problem:   Diabetic infection of left foot ([place]) Active Problems:   Type 2 diabetes mellitus with ESRD (end-stage renal disease) ([place])   ESRD (end stage renal disease) ([place])   Pressure injury of skin   HTN (hypertension)   Right hip pain   Subacute osteomyelitis of left foot ([facility])   Abscess of left lower leg  Hospital Course: PMH of type II DM, HTN, ESRD on HD MWF, neuropathy, nephropathy, retinopathy, nonhealing ulcers on the leg seen by podiatry. Present to the hospital with complaints of worsening wound and drainage ongoing since December 2023.  Was initially sent to current of hospital ER from podiatry office-[facility]. 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  Orthopedic consulted.  Most likely will require surgical management tentatively on Friday.  Nephrology also following for HD needs. Assessment and Plan  Osteomyelitis of the calcaneus and left fifth metatarsal with pathological fracture of calcaneus and abscess. Infection from gas-forming organism. No blood cultures performed before initiation of antibiotic here in the hospital.  Cultures so far negative blood. ABI unable to compress the vasculature. Orthopedic consulted.  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 Culture sent.  Monitor. Initially started on Vanco Flagyl and Rocephin. I changed to [place].  after that was on Zosyn only Wound culture growing Staph aureus.  Vancomycin was added again.  Since SA was Sensitive to oxacillin pt was switched to IV Unasyn in the hospital. Will Switch to oral Augmentin on discharge for total of 3-week therapy after surgery.   Concerns with heparin. See note from 2/24. Extensive discussion with the patient. Patient thought that the heparin was responsible for episodes of delirium that was thought to be secondary to dialysis disequilibrium syndrome in 2022. Currently patient agreeable for heparin for DVT prophylaxis given his risk for DVT is higher.   Right hip pain. X-ray unremarkable. Monitor.   ESRD on HD MWF. Nephrology consulted. Appreciate nephrology assistance for HD in the hospital.   HTN. Blood pressure stable. Continue current regimen.   Type 2 diabetes mellitus with ESRD and neuropathy with nonhealing wound, uncontrolled with hyperglycemia. Currently on sliding scale insulin. Hold oral hypoglycemic agent and maintain sliding scale at CIR   Obesity Body mass index is 34.15 kg/m.  Placing the pt at higher risk of poor outcomes.   Left heel unstageable pressure injury present on admission. Medial stage II coccyx pressure injury present on admission. Continue foam dressing wound coccyx.  Pain control - [employer] Controlled Substance Reporting System database was reviewed. and patient was instructed, not to drive, operate heavy machinery, perform activities at heights, swimming or participation in water activities or provide baby-sitting services while on Pain, Sleep and Anxiety Medications; until their outpatient Physician has advised to do so again. Also recommended to not to take more than prescribed Pain, Sleep and Anxiety Medications.  Consultants:  Orthopedics  Nephrology   Procedures performed:  Left  BKA with wound vac placement  DISCHARGE MEDICATION: Allergies as of 06/15/2022       Reactions   Pioglitazone Swelling   Site of swelling not recalled by patient   Adhesive [tape]  Other (See Comments)   Causes redness, pt prefers paper tape    Heparin Other (See Comments)   Per pt he has episodes where he can't speak. He has tolerated this med multiple times since this entered so unlikely it is true allergy   Latex Rash   Redness, also        Medication List     STOP taking these medications    glimepiride 2 MG tablet Commonly known as: AMARYL   Rybelsus 14 MG Tabs Generic drug: Semaglutide   Santyl 250 UNIT/GM ointment Generic drug: collagenase   Up4 Probiotics Chew       TAKE these medications    (feeding supplement) PROSource Plus liquid Take 30 mLs by mouth 3 (three) times daily between meals.   feeding supplement (NEPRO CARB STEADY) Liqd Take 237 mLs by mouth 2 (two) times daily between meals.   nutrition supplement (JUVEN) Pack Take 1 packet by mouth 2 (two) times daily between meals.   amoxicillin-clavulanate 500-125 MG tablet Commonly known as: Augmentin Take 1 tablet by mouth daily at 6 PM for 19 days.   ascorbic acid 1000 MG tablet Commonly known as: VITAMIN C Take 1,000 mg by mouth daily.   atorvastatin 40 MG tablet Commonly known as: LIPITOR Take 1 tablet (40 mg total) by mouth daily.   calcitRIOL 0.25 MCG capsule Commonly known as: ROCALTROL Take 5 capsules (1.25 mcg total) by mouth every Monday, Wednesday, and Friday with hemodialysis. Start taking on: June 16, 2022   carvedilol 25 MG tablet Commonly known as: COREG Take 1 tablet (25 mg total) by mouth 2 (two) times daily with a meal.   co-enzyme Q-10 30 MG capsule Take 30 mg by mouth 2 (two) times daily.   cyanocobalamin 1000 MCG tablet Take 1,000 mcg by mouth daily.   Gerhardt's butt cream Crea Apply 1 Application topically 2 (two) times daily.   irbesartan 75 MG tablet Commonly known as: AVAPRO Take 1 tablet (75 mg total) by mouth daily. Start taking on: June 16, 2022 What changed:  medication strength how much to take   lactobacillus  Pack Take 1 packet (1 g total) by mouth 3 (three) times daily with meals.   loperamide 2 MG capsule Commonly known as: IMODIUM Take 1 capsule (2 mg total) by mouth every 6 (six) hours as needed for diarrhea or loose stools.   multivitamin Tabs tablet Take 1 tablet by mouth at bedtime.   oxyCODONE 5 MG immediate release tablet Commonly known as: Oxy IR/ROXICODONE Take 1 tablet (5 mg total) by mouth every 6 (six) hours as needed for severe pain or moderate pain.   polyethylene glycol powder 17 GM/SCOOP powder Commonly known as: GLYCOLAX/MIRALAX Take 17 g by mouth daily as needed for mild constipation.   torsemide 100 MG tablet Commonly known as: DEMADEX Take 100 mg by mouth See admin instructions. Takes on Tuesday,Thursday,Saturday and Sunday(non-dialysis days)   Velphoro 500 MG chewable tablet Generic drug: sucroferric oxyhydroxide Chew 1,000 mg by mouth 3 (three) times daily with meals.   zinc sulfate 220 (50 Zn) MG capsule Take 1 capsule (220 mg total) by mouth daily. Start taking on: June 16, 2022               Discharge Care Instructions  (  From admission, onward)           Start     Ordered   06/15/22 0000  Discharge wound care:       Comments: Continue wound vac as recommended by Dr Sharol Given   06/15/22 1257           Disposition: CIR Diet recommendation: Carb modified diet  Discharge Exam: Vitals:   06/14/22 1804 06/14/22 1948 06/15/22 0443 06/15/22 0737  BP: 130/84 128/88 126/85 124/89  Pulse: 79 85 90 89  Resp: '18 18 18 18  '$ Temp: 98 F (36.7 C) 97.8 F (36.6 C) 97.7 F (36.5 C) 98.1 F (36.7 C)  TempSrc:  Oral Oral Oral  SpO2: 95% 98% 94% 94%  Weight:      Height:       General: Appear in mild distress; no visible Abnormal Neck Mass Or lumps, Conjunctiva normal Cardiovascular: S1 and S2 Present, no Murmur, Respiratory: good respiratory effort, Bilateral Air entry present and CTA, no Crackles, no wheezes Abdomen: Bowel Sound present,  Non tender  Extremities: left BKA, no Pedal edema Neurology: alert and oriented to time, place, and person  Filed Weights   06/14/22 1322 06/14/22 1704 06/14/22 1712  Weight: 118.2 kg 114.2 kg 114.2 kg   Condition at discharge: stable  The results of significant diagnostics from this hospitalization (including imaging, microbiology, ancillary and laboratory) are listed below for reference.   Imaging Studies: VAS [place] ABI WITH/WO TBI  Result Date: 06/09/2022  LOWER EXTREMITY DOPPLER STUDY Patient Name:  Scott Castillo  Date of Exam:   06/09/2022 Medical Rec #: [id number]        Accession #:    [id number] Date of Birth: 1961/02/08        Patient Gender: M Patient Age:   62 years Exam Location:  [facility] Procedure:      VAS [place] ABI WITH/WO TBI Referring Phys: Jennette Kettle --------------------------------------------------------------------------------  Indications: Ulceration. High Risk Factors: Hypertension, hyperlipidemia, Diabetes.  Vascular Interventions: 09/17/2016 - ARTERIOVENOUS (AV) FISTULA CREATION Left. Limitations: Today's exam was limited due to an open wound, bandages and Great              toe girth. Comparison Study: No prior studies. Performing Technologist: Carlos Levering RVT  Examination Guidelines: A complete evaluation includes at minimum, Doppler waveform signals and systolic blood pressure reading at the level of bilateral brachial, anterior tibial, and posterior tibial arteries, when vessel segments are accessible. Bilateral testing is considered an integral part of a complete examination. Photoelectric Plethysmograph (PPG) waveforms and toe systolic pressure readings are included as required and additional duplex testing as needed. Limited examinations for reoccurring indications may be performed as noted.  ABI Findings: +--------+------------------+-----+-----------+--------+ Right   Rt Pressure (mmHg)IndexWaveform   Comment   +--------+------------------+-----+-----------+--------+ [id number]                    triphasic           +--------+------------------+-----+-----------+--------+ PTA     254               2.00 multiphasic         +--------+------------------+-----+-----------+--------+ DP      254               2.00 multiphasic         +--------+------------------+-----+-----------+--------+ +--------+------------------+-----+-----------+-------+ Left    Lt Pressure (mmHg)IndexWaveform   Comment +--------+------------------+-----+-----------+-------+ Brachial  AVF     +--------+------------------+-----+-----------+-------+ PTA     254               2.00 multiphasic        +--------+------------------+-----+-----------+-------+ DP      254               2.00 multiphasic        +--------+------------------+-----+-----------+-------+ +-------+-----------+-----------+------------+------------+ ABI/TBIToday's ABIToday's TBIPrevious ABIPrevious TBI +-------+-----------+-----------+------------+------------+ Right  [place]                                             +-------+-----------+-----------+------------+------------+ Left   [place]                                             +-------+-----------+-----------+------------+------------+   Summary: Right: Resting right ankle-brachial index indicates noncompressible right lower extremity arteries. Unable to obtain TBI due to great toe girth. Left: Resting left ankle-brachial index indicates noncompressible left lower extremity arteries. Unable to obtain TBI due to great toe girth. *See table(s) above for measurements and observations.  Electronically signed by Jamelle Haring on 06/09/2022 at 10:05:17 PM.    Final    MR FOOT LEFT WO CONTRAST  Result Date: 06/09/2022 CLINICAL DATA:  Soft tissue infection suspected, foot, xray done EXAM: MRI OF THE LEFT FOOT WITHOUT CONTRAST TECHNIQUE: Multiplanar,  multisequence MR imaging of the left foot was performed. No intravenous contrast was administered. COMPARISON:  Radiograph 06/08/2022 FINDINGS: Bones/Joint/Cartilage There is a tongue-type posterior calcaneus fracture with extensive marrow edema. There is fluid within the fracture cleft and soft tissue gas. There is a tibiotalar joint effusion with periarticular marrow edema and erosive changes at the tear lateral distal tibia and adjacent anterior aspect of the lateral malleolus (coronal T1 image 18). 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 Prior partial second toe amputation. There is extensive marrow edema within the fifth metatarsal most prominent at the distal metatarsal head/neck. Ligaments Intact Lisfranc ligament. Muscles and Tendons Chronic denervation changes. Mild tendons of itis of the PT tendon and peroneal tendons. Soft tissues Extensive soft tissue swelling of the foot with scattered soft tissue gas as seen on recent radiograph. Along the lateral midfoot, there is thick heterogeneous T2 hyperintense soft tissue of similar intensity to the fluid within the calcaneal fracture cleft, measuring up to 5.7 x 4.7 cm in the axial plane and at least 8.3 cm in proximal-distal extent. This is present along the plantar midfoot and hindfoot posteriorly and may communicate with the fluid in the calcaneal fracture cleft (series 5 images 20-46). IMPRESSION: Displaced tongue-type posterior calcaneus fracture with extensive associated marrow edema and probable perifracture osteomyelitis. Fluid and soft tissue gas noted within the fracture cleft. Extensive soft tissue swelling and scattered gas of the foot, with thick heterogeneous fluid signal extending along the lateral midfoot/forefoot, along the plantar musculature posteriorly and potentially communicating with the fluid within the fracture cleft. This heterogeneous collection measures a maximum 5.7 x 4.7 x 8.3 cm along the lateral midfoot,  and could represent phlegmon/abscess. 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 Tibiotalar joint effusion with periarticular marrow edema and mild erosive changes in the anterolateral distal tibia and adjacent anterior aspect of the lateral malleolus, concerning for potential infectious involvement of the tibiotalar joint.  Additional marrow edema signal within the fifth metatarsal most prominent in the region of the distal metatarsal head/neck, concerning for early osteomyelitis. Mild tenosynovitis of the PT tendon and peroneal tendons. Electronically Signed   By: Maurine Simmering M.D.   On: 06/09/2022 13:57   DG HIP UNILAT WITH PELVIS 2-3 VIEWS RIGHT  Result Date: 06/09/2022 CLINICAL DATA:  Right hip pain for several days. The patient reports a recent fall. EXAM: DG HIP (WITH OR WITHOUT PELVIS) 2-3V RIGHT COMPARISON:  None Available. FINDINGS: There is no acute or focal bony or joint abnormality. The patient has moderate bilateral hip osteoarthritis. Chondrocalcinosis is seen about both hips. Extensive vascular calcifications are noted. IMPRESSION: No acute abnormality. Moderate bilateral hip osteoarthritis. Electronically Signed   By: Inge Rise M.D.   On: 06/09/2022 08:18    Microbiology: Results for orders placed or performed during the hospital encounter of 06/08/22  MRSA Next Gen by PCR, Nasal     Status: None   Collection Time: 06/08/22  8:43 PM   Specimen: Nasal Mucosa; Nasal Swab  Result Value Ref Range Status   MRSA by PCR Next Gen NOT DETECTED NOT DETECTED Final    Comment: (NOTE) The GeneXpert MRSA Assay (FDA approved for NASAL specimens only), is one component of a comprehensive MRSA colonization surveillance program. It is not intended to diagnose MRSA infection nor to guide or monitor treatment for MRSA infections. Test performance is not FDA approved in patients less than 75 years old. Performed at [facility], [place] [address], [place],  [place] [address]   Culture, blood (Routine X 2) w Reflex to ID Panel     Status: None   Collection Time: 06/09/22 10:28 AM   Specimen: BLOOD RIGHT FOREARM  Result Value Ref Range Status   Specimen Description BLOOD RIGHT FOREARM  Final   Special Requests   Final    BOTTLES DRAWN AEROBIC AND ANAEROBIC Blood Culture adequate volume   Culture   Final    NO GROWTH 5 DAYS Performed at [facility], [place] [address] [place], [place] [address]    Report Status 06/14/2022 FINAL  Final  Culture, blood (Routine X 2) w Reflex to ID Panel     Status: None   Collection Time: 06/09/22 10:28 AM   Specimen: BLOOD RIGHT HAND  Result Value Ref Range Status   Specimen Description BLOOD RIGHT HAND  Final   Special Requests   Final    BOTTLES DRAWN AEROBIC AND ANAEROBIC Blood Culture adequate volume   Culture   Final    NO GROWTH 5 DAYS Performed at [facility], [place] [address] [place], [place] [address]    Report Status 06/14/2022 FINAL  Final  Surgical pcr screen     Status: None   Collection Time: 06/11/22  7:19 AM   Specimen: Nasal Mucosa; Nasal Swab  Result Value Ref Range Status   MRSA, PCR NEGATIVE NEGATIVE Final   Staphylococcus aureus NEGATIVE NEGATIVE Final    Comment: (NOTE) The Xpert SA Assay (FDA approved for NASAL specimens in patients 68 years of age and older), is one component of a comprehensive surveillance program. It is not intended to diagnose infection nor to guide or monitor treatment. Performed at [facility], [place] [address], [place], [place] [address]   Aerobic/Anaerobic Culture w Gram Stain (surgical/deep wound)     Status: None (Preliminary result)   Collection Time: 06/11/22  2:49 PM   Specimen: Soft Tissue, Other  Result Value Ref Range Status   Specimen Description TISSUE  Final   Special Requests LEFT BKA  Final   Gram Stain   Final    NO WBC SEEN NO ORGANISMS SEEN Performed at [facility], [address], [place], [place] [address]     Culture   Final    RARE STAPHYLOCOCCUS AUREUS NO ANAEROBES ISOLATED; CULTURE IN PROGRESS FOR 5 DAYS    Report Status PENDING  Incomplete   Organism ID, Bacteria STAPHYLOCOCCUS AUREUS  Final      Susceptibility   Staphylococcus aureus - MIC*    CIPROFLOXACIN <=0.5 SENSITIVE Sensitive     ERYTHROMYCIN <=0.25 SENSITIVE Sensitive     GENTAMICIN <=0.5 SENSITIVE Sensitive     OXACILLIN 0.5 SENSITIVE Sensitive     TETRACYCLINE >=16 RESISTANT Resistant     VANCOMYCIN 1 SENSITIVE Sensitive     TRIMETH/SULFA <=10 SENSITIVE Sensitive     CLINDAMYCIN <=0.25 SENSITIVE Sensitive     RIFAMPIN <=0.5 SENSITIVE Sensitive     Inducible Clindamycin NEGATIVE Sensitive     * RARE STAPHYLOCOCCUS AUREUS   Labs: CBC: Recent Labs  Lab 06/10/22 0817 06/11/22 0358 06/12/22 0050 06/13/22 0951 06/14/22 0226 06/15/22 0424  WBC 13.9* 16.9* 16.4* 22.2* 20.4* 16.7*  NEUTROABS 12.3* 15.0* 14.3*  --   --   --   HGB 7.6* 8.9* 8.0* 9.2* 8.5* 8.5*  HCT 24.2* 27.6* 26.0* 30.0* 26.4* 26.8*  MCV 87.7 86.3 88.4 88.8 87.1 87.6  PLT 271 285 273 311 254 [id number]   Basic Metabolic Panel: Recent Labs  Lab 06/10/22 0817 06/11/22 0358 06/12/22 0050 06/13/22 0951 06/14/22 0226 06/15/22 0424  NA 129* 126* 124* 122* 124* 127*  K 3.9 4.4 4.4 4.2 3.9 3.9  CL 89* 87* 88* 86* 89* 90*  CO2 '26 25 23 22 23 '$ 21*  GLUCOSE 309* 267* 255* 290* 225* 198*  BUN 44* 71* 85* 68* 53* 59*  CREATININE 6.08* 6.90* 7.72* 6.48* 5.49* 6.28*  CALCIUM 8.8* 9.1 8.6* 8.4* 8.4* 9.0  MG 2.0 2.2 2.1 1.9 1.9 2.0  PHOS 4.8*  --   --  5.3* 4.3 5.0*   Liver Function Tests: Recent Labs  Lab 06/10/22 0817 06/11/22 0358 06/12/22 0050 06/13/22 0951 06/14/22 0226 06/15/22 0424  AST 40 50* 37  --   --   --   ALT '24 31 24  '$ --   --   --   ALKPHOS 98 172* 118  --   --   --   BILITOT 0.7 0.6 0.6  --   --   --   PROT 6.4* 6.9 5.9*  --   --   --   ALBUMIN 1.7* 1.9* 1.6* 1.7* 1.6* 1.6*   CBG: Recent Labs  Lab 06/14/22 1124 06/14/22 1804  06/14/22 1951 06/15/22 0704 06/15/22 1139  GLUCAP 229* 158* 155* 193* 288*    Discharge time spent: greater than 30 minutes.  Signed: Berle Mull, MD [facility]

## 2022-06-15 NOTE — Progress Notes (Signed)
PMR Admission Coordinator Pre-Admission Assessment   Patient: Scott Castillo is an 62 y.o., male MRN: [id number] DOB: 02/03/1961 Height: 6' (182.9 cm) Weight: 114.2 kg   Insurance Information HMO:     PPO: yes     PCP:      IPA:      80/20:      OTHER:  PRIMARY: Medicare A and B       Policy#: [id number]     Subscriber: patient CM Name:       Phone#:      Fax#:  Pre-Cert#: verified Civil engineer, contracting:  Benefits:  Phone #:      Name:  Eff. Date: A 02/18/2019, B 03/19/20     Deduct: [id number]      Out of Pocket Max: n/a      Life Max: n/a CIR: 100%      SNF: 20 full days Outpatient: 80%     Co-Pay: 20% Home Health: 100%      Co-Pay:  DME: 80%     Co-Pay: 20% Providers: in network  SECONDARY:  BCBS comm PPO    Policy#: [id number]      Phone#:  Financial Counselor:       Phone#:    The "Data Collection Information Summary" for patients in Inpatient Rehabilitation Facilities with attached "Privacy Act [place] Records" was provided and verbally reviewed with: Patient   Emergency Contact Information Contact Information       Name Relation Home Work Mobile    [place] Spouse [phone]   [phone]    [place] Daughter     [phone]    Claris Gower Mother [id number]        [place] Father     [phone]           Current Medical History  Patient Admitting Diagnosis: L BKA   History of Present Illness: A 62 y.o. male with medical history significant of DM2, HTN, ESRD on MWF dialysis.  ESRD in setting of diabetic nephropathy, also has known diabetic retinopathy and severe diabetic neuropathy of LE.  Pt in to ED at [facility] on 06/08/22 with diabetic foot infection and ulcer.  Ulcer with abscess drained in Dec.  Has been being cared fore by podiatry.  Despite ABx and wound care has now progressed to infection.  Saw Dr. Gean Quint ([facility]) 06/08/22 who sent him in to ED at [facility] for ABx.  X ray at [facility] worrisome for osteomyelitis with calcaneus fracture present.  Pt  later transferred to [facility] on 06/08/22 for availability of surgery for foot.  On 06/11/22 patient underwent L BKA by Dr. Sharol Given.  PT/OT evaluations were completed with recommendations for acute inpatient rehab admission.     Patient's medical record from [facility] has been reviewed by the rehabilitation admission coordinator and physician.   Past Medical History      Past Medical History:  Diagnosis Date   C7 radiculopathy     Carpal tunnel syndrome, bilateral      by EMG  R>L   DM (diabetes mellitus), type 2 with neurological complications (HCC)      polyneuropathy   H/O pancreatic cancer     HLD (hyperlipidemia)     HTN (hypertension)     Lumbar radiculopathy, chronic      hemilamectomy '94   Venous stasis dermatitis of both lower extremities        Has the patient had [place] surgery during 100 days prior to  admission? Yes   Family History   family history includes Edema in his mother; Heart attack in his brother, brother, and father; Heart failure in his father.   Current Medications   Current Facility-Administered Medications:    (feeding supplement) PROSource Plus liquid 30 mL, 30 mL, Oral, TID BM, Newt Minion, MD, 30 mL at 06/15/22 1113   acetaminophen (TYLENOL) tablet 650 mg, 650 mg, Oral, Q6H PRN **OR** acetaminophen (TYLENOL) suppository 650 mg, 650 mg, Rectal, Q6H PRN, Newt Minion, MD   alum & mag hydroxide-simeth (MAALOX/MYLANTA) 200-200-20 MG/5ML suspension 15-30 mL, 15-30 mL, Oral, Q2H PRN, Newt Minion, MD, 30 mL at 06/15/22 0549   Ampicillin-Sulbactam (UNASYN) 3 g in sodium chloride 0.9 % 100 mL IVPB, 3 g, Intravenous, Q24H, Wendee Beavers, RPH, Last Rate: 200 mL/hr at 06/14/22 1809, 3 g at 06/14/22 1809   ascorbic acid (VITAMIN C) tablet 1,000 mg, 1,000 mg, Oral, Daily, Newt Minion, MD, 1,000 mg at 06/15/22 1114   atorvastatin (LIPITOR) tablet 40 mg, 40 mg, Oral, Daily, Newt Minion, MD, 40 mg at 06/15/22 1120   calcitRIOL (ROCALTROL)  capsule 1.25 mcg, 1.25 mcg, Oral, Q M,W,F-HD, Newt Minion, MD, 1.25 mcg at 06/14/22 1526   carvedilol (COREG) tablet 25 mg, 25 mg, Oral, BID WC, Newt Minion, MD, 25 mg at 06/15/22 0739   Chlorhexidine Gluconate Cloth 2 % PADS 6 each, 6 each, Topical, Q0600, Newt Minion, MD, 6 each at 06/14/22 [phone]   Darbepoetin Alfa (ARANESP) injection 200 mcg, 200 mcg, Subcutaneous, Q Mon-1800, Ejigiri, Ogechi Grace, PA-C, 200 mcg at 06/14/22 1814   feeding supplement (NEPRO CARB STEADY) liquid 237 mL, 237 mL, Oral, BID BM, Newt Minion, MD, 237 mL at 06/14/22 1039   Gerhardt's butt cream, , Topical, BID, Lavina Hamman, MD, Given at 06/15/22 1132   guaiFENesin-dextromethorphan (ROBITUSSIN DM) 100-10 MG/5ML syrup 15 mL, 15 mL, Oral, Q4H PRN, Newt Minion, MD   heparin injection 5,000 Units, 5,000 Units, Subcutaneous, Q8H, Lavina Hamman, MD, 5,000 Units at 06/15/22 0535   insulin aspart (novoLOG) injection 0-15 Units, 0-15 Units, Subcutaneous, TID WC, Lavina Hamman, MD, 8 Units at 06/15/22 1153   insulin aspart (novoLOG) injection 0-5 Units, 0-5 Units, Subcutaneous, QHS, Lavina Hamman, MD   insulin glargine-yfgn [facility]) injection 6 Units, 6 Units, Subcutaneous, Daily, Lavina Hamman, MD, 6 Units at 06/15/22 1114   irbesartan (AVAPRO) tablet 75 mg, 75 mg, Oral, Daily, Valentina Gu, NP, 75 mg at 06/15/22 1116   lactobacillus (FLORANEX/LACTINEX) granules 1 g, 1 g, Oral, TID WC, Newt Minion, MD, 1 g at 06/15/22 1157   multivitamin (RENA-VIT) tablet 1 tablet, 1 tablet, Oral, QHS, Newt Minion, MD, 1 tablet at 06/14/22 2104   nutrition supplement (JUVEN) (JUVEN) powder packet 1 packet, 1 packet, Oral, BID BM, Newt Minion, MD, 1 packet at 06/15/22 1113   ondansetron (ZOFRAN) tablet 4 mg, 4 mg, Oral, Q6H PRN **OR** ondansetron (ZOFRAN) injection 4 mg, 4 mg, Intravenous, Q6H PRN, Newt Minion, MD   Oral care mouth rinse, 15 mL, Mouth Rinse, PRN, Newt Minion, MD   oxyCODONE (Oxy  IR/ROXICODONE) immediate release tablet 10 mg, 10 mg, Oral, Q4H PRN, Lavina Hamman, MD   oxyCODONE (Oxy IR/ROXICODONE) immediate release tablet 5 mg, 5 mg, Oral, Q4H PRN, Lavina Hamman, MD   pantoprazole (PROTONIX) EC tablet 40 mg, 40 mg, Oral, Daily, Newt Minion, MD, 40 mg at  06/15/22 1116   phenol (CHLORASEPTIC) mouth spray 1 spray, 1 spray, Mouth/Throat, PRN, Newt Minion, MD   polyethylene glycol (MIRALAX / GLYCOLAX) packet 17 g, 17 g, Oral, Daily PRN, Newt Minion, MD   sucroferric oxyhydroxide [facility]) chewable tablet 1,000 mg, 1,000 mg, Oral, TID WC, Newt Minion, MD, 1,000 mg at 06/15/22 1157   torsemide [facility]) tablet 100 mg, 100 mg, Oral, Once per day on Sun Tue Thu Sat, Duda, Marcus V, MD, 100 mg at 06/15/22 1116   zinc sulfate capsule 220 mg, 220 mg, Oral, Daily, Newt Minion, MD, 220 mg at 06/15/22 1114   Patients Current Diet:  Diet Order                  Diet Carb Modified             Diet Carb Modified Fluid consistency: Thin; Room service appropriate? Yes; Fluid restriction: 1200 mL Fluid  Diet effective now                         Precautions / Restrictions Precautions Precautions: Fall Other Brace: L limb protector Restrictions Weight Bearing Restrictions: Yes LLE Weight Bearing: Non weight bearing    Has the patient had 2 or more falls or a fall with injury in the past year? 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  Does drive.   Prior Functional Level Self Care: Did the patient need help bathing, dressing, using the toilet or eating? Needed some help   Indoor Mobility: Did the patient need assistance with walking from room to room (with or without device)? Independent   Stairs: Did the patient need assistance with internal or external stairs (with or without device)? Needed some help   Functional Cognition: Did the patient need help planning regular tasks such as shopping  or remembering to take medications? Independent   Patient Information Are you of Hispanic, Latino/a,or Spanish origin?: A. No, not of Hispanic, Latino/a, or Spanish origin What is your race?: A. White Do you need or want an interpreter to communicate with a doctor or health care staff?: 0. No   Patient's Response To:  Health Literacy and Transportation Is the patient able to respond to health literacy and transportation needs?: Yes Health Literacy - How often do you need to have someone help you when you read instructions, pamphlets, or other written material from your doctor or pharmacy?: Rarely In the past 12 months, has lack of transportation kept you from medical appointments or from getting medications?: Yes In the past 12 months, has lack of transportation kept you from meetings, work, or from getting things needed for daily living?: Yes   Home Assistive Devices / Twilight Devices/Equipment: Environmental consultant (specify type), Eyeglasses Home Equipment: Conservation officer, nature (2 wheels), Rollator (4 wheels), BSC/3in1, Grab bars - tub/shower, Cane - quad   Prior Device Use: Indicate devices/aids used by the patient prior to current illness, exacerbation or injury?  Cane   Current Functional Level Cognition   Overall Cognitive Status: Within Functional Limits for tasks assessed Orientation Level: Oriented X4    Extremity Assessment (includes Sensation/Coordination)   Upper Extremity Assessment: Generalized weakness (arthritis in joints)  Lower Extremity Assessment: Defer to PT evaluation RLE Deficits / Details: pt reports some limitations due to recent hip pain LLE Deficits / Details: BKA     ADLs  Overall ADL's : Needs assistance/impaired Eating/Feeding: Set up, Sitting Grooming: Set up, Bed level Upper Body Bathing: Min guard, Sitting Lower Body Bathing: Maximal assistance, Total assistance, Sitting/lateral leans, Sit to/from stand Upper Body Dressing : Min guard,  Sitting Lower Body Dressing: Maximal assistance, Total assistance, Sitting/lateral leans, Sit to/from stand Toilet Transfer: Moderate assistance, +2 for physical assistance, +2 for safety/equipment, Stand-pivot, Rolling walker (2 wheels) Toilet Transfer Details (indicate cue type and reason): heavy mod A of 2 initially, but able to complete 4 sit<> stands at [place] and Hygiene: Total assistance, Sit to/from stand Toileting - Clothing Manipulation Details (indicate cue type and reason): in stedy Functional mobility during ADLs: Moderate assistance, +2 for physical assistance, +2 for safety/equipment, Cueing for safety, Cueing for sequencing, Rolling walker (2 wheels) General ADL Comments: Patient presenting with weakness, deficits in balance and stability, and need for increased assist to complete all ADLs     Mobility   Overal bed mobility: Needs Assistance Bed Mobility: Sit to Supine Rolling: Min guard Supine to sit: Min guard Sit to supine: Min guard General bed mobility comments: min gaurd for safety     Transfers   Overall transfer level: Needs assistance Equipment used: Ambulation equipment used Transfers: Sit to/from Stand Sit to Stand: +2 physical assistance, Max assist Bed to/from chair/wheelchair/BSC transfer type:: Stand pivot Stand pivot transfers: Max assist, +2 physical assistance General transfer comment: +2 max assist using bed pad under hips, flexed posture on stedy bar     Ambulation / Gait / Stairs / Wheelchair Mobility   Ambulation/Gait Pre-gait activities: statis standing with weight shift to L to find midline     Posture / Balance Balance Overall balance assessment: Needs assistance Sitting-balance support: No upper extremity supported, Feet supported Sitting balance-Leahy Scale: Fair Standing balance support: Bilateral upper extremity supported, During functional activity, Reliant on assistive device for balance (.) Standing  balance-Leahy Scale: Zero Standing balance comment: walker and min assist for static standing     Special needs/care consideration Dialysis: Hemodialysis Monday, Wednesday, and Friday, Wound Vac Yes to L BKA, Skin Post op incision and VAC L BKA, Diabetic management h/o DM and on insulin, and Special service needs n/a    Previous Home Environment (from acute therapy documentation) Living Arrangements: Spouse/significant other Available Help at Discharge: Family Type of Home: Mobile home Home Layout: One level Home Access: Stairs to enter Entrance Stairs-Rails: Right, Left, Can reach both Entrance Stairs-Number of Steps: 4 Bathroom Shower/Tub: Chiropodist: Handicapped height Bathroom Accessibility: No Home Care Services: No Additional Comments: Wife thinks front door and bathroom door may be too narrow for w/c   Discharge Living Setting Plans for Discharge Living Setting: Mobile Home Type of Home at Discharge: Mobile home (Double wide) Discharge Home Layout: One level Discharge Home Access: Stairs to enter Entrance Stairs-Rails: Right, Left, Can reach both Entrance Stairs-Number of Steps: 4 Discharge Bathroom Shower/Tub: Tub/shower unit, Curtain Discharge Bathroom Toilet: Standard Discharge Bathroom Accessibility: No (Wife feels doors may be too narrow. ?sideways for walker?) Does the patient have any problems obtaining your medications?: No   Social/Family/Support Systems Patient Roles: Spouse, Parent (Has a wife and a daughter.) Contact Information: Jentry Slayton - wife - [phone] Anticipated Caregiver: wife Ability/Limitations of Caregiver: Wife works FT in Ambulance person 630 am to 3 pm.  Not sure who could stay with patient during her work time. Caregiver Availability: Evenings only (Wife understands that patient will need 24/7 supervision) Discharge Plan Discussed with Primary Caregiver:  Yes Is Caregiver In Agreement with Plan?: Yes Does Caregiver/Family  have Issues with Lodging/Transportation while Pt is in Rehab?: No   Goals Patient/Family Goal for Rehab: PT/OT supervision goals Expected length of stay: 10-14 days Pt/Family Agrees to Admission and willing to participate: Yes Program Orientation Provided & Reviewed with Pt/Caregiver Including Roles  & Responsibilities: Yes   Decrease burden of Care through IP rehab admission: N/A   Possible need for SNF placement upon discharge: Yes, if wife cannot assure 24/7 supervision at discharge   Patient Condition: I have reviewed medical records from [facility], spoken with CM, and patient and spouse. I met with patient at the bedside for inpatient rehabilitation assessment.  Patient will benefit from ongoing PT and OT, can actively participate in 3 hours of therapy a day 5 days of the week, and can make measurable gains during the admission.  Patient will also benefit from the coordinated team approach during an Inpatient Acute Rehabilitation admission.  The patient will receive intensive therapy as well as Rehabilitation physician, nursing, social worker, and care management interventions.  Due to bladder management, bowel management, safety, skin/wound care, disease management, medication administration, pain management, and patient education the patient requires 24 hour a day rehabilitation nursing.  The patient is currently max +2 with mobility and basic ADLs.  Discharge setting and therapy post discharge at home with outpatient is anticipated.  Patient has agreed to participate in the Acute Inpatient Rehabilitation Program and will admit today.   Preadmission Screen Completed By:  Karene Fry with updates by Michel Santee, 06/15/2022 1:41 PM ______________________________________________________________________   Discussed status with Dr. Dagoberto Ligas on 06/15/22  at 1:41 PM  and received approval for admission today.   Admission Coordinator:  Michel Santee, PT, time 1:41 PM Sudie Grumbling 06/15/22      Assessment/Plan: Diagnosis: Does the need for close, 24 hr/day Medical supervision in concert with the patient's rehab needs make it unreasonable for this patient to be served in a less intensive setting? Yes Co-Morbidities requiring supervision/potential complications: L BKA; ESRD on HD M/W/F, stage II coccyx, DM, basically anuric-  Due to bladder management, bowel management, safety, skin/wound care, disease management, medication administration, pain management, and patient education, does the patient require 24 hr/day rehab nursing? Yes Does the patient require coordinated care of a physician, rehab nurse, PT, OT, and SLP to address physical and functional deficits in the context of the above medical diagnosis(es)? Yes Addressing deficits in the following areas: balance, endurance, locomotion, strength, transferring, bowel/bladder control, bathing, dressing, feeding, grooming, and toileting Can the patient actively participate in an intensive therapy program of at least 3 hrs of therapy 5 days a week? Yes The potential for patient to make measurable gains while on inpatient rehab is good Anticipated functional outcomes upon discharge from inpatient rehab: supervision PT, supervision OT, n/a SLP Estimated rehab length of stay to reach the above functional goals is: 10-14 days Anticipated discharge destination: Home 10. Overall Rehab/Functional Prognosis: good     MD Signature:

## 2022-06-15 NOTE — Progress Notes (Signed)
[facility] Progress Note   Subjective: Up in chair, looks much better today. HD yesterday to resume MWF, Net UF 4 liters. Says he plans to do in house rehab.   Objective Vitals:   06/14/22 1804 06/14/22 1948 06/15/22 0443 06/15/22 0737  BP: 130/84 128/88 126/85 124/89  Pulse: 79 85 90 89  Resp: '18 18 18 18  '$ Temp: 98 F (36.7 C) 97.8 F (36.6 C) 97.7 F (36.5 C) 98.1 F (36.7 C)  TempSrc:  Oral Oral Oral  SpO2: 95% 98% 94% 94%  Weight:      Height:       Physical Exam General: Chronically ill appearing  Heart: S1,S2 No M/R/G Lungs: CTAB Abdomen: NT, NABS Extremities: L BKA stump protector/wound vac in place.  Dialysis Access: L AVF +T/B  Additional Objective Labs: Basic Metabolic Panel: Recent Labs  Lab 06/13/22 0951 06/14/22 0226 06/15/22 0424  NA 122* 124* 127*  K 4.2 3.9 3.9  CL 86* 89* 90*  CO2 22 23 21*  GLUCOSE 290* 225* 198*  BUN 68* 53* 59*  CREATININE 6.48* 5.49* 6.28*  CALCIUM 8.4* 8.4* 9.0  PHOS 5.3* 4.3 5.0*   Liver Function Tests: Recent Labs  Lab 06/10/22 0817 06/11/22 0358 06/12/22 0050 06/13/22 0951 06/14/22 0226 06/15/22 0424  AST 40 50* 37  --   --   --   ALT '24 31 24  '$ --   --   --   ALKPHOS 98 172* 118  --   --   --   BILITOT 0.7 0.6 0.6  --   --   --   PROT 6.4* 6.9 5.9*  --   --   --   ALBUMIN 1.7* 1.9* 1.6* 1.7* 1.6* 1.6*   No results for input(s): "LIPASE", "AMYLASE" in the last 168 hours. CBC: Recent Labs  Lab 06/10/22 0817 06/11/22 0358 06/12/22 0050 06/13/22 0951 06/14/22 0226 06/15/22 0424  WBC 13.9* 16.9* 16.4* 22.2* 20.4* 16.7*  NEUTROABS 12.3* 15.0* 14.3*  --   --   --   HGB 7.6* 8.9* 8.0* 9.2* 8.5* 8.5*  HCT 24.2* 27.6* 26.0* 30.0* 26.4* 26.8*  MCV 87.7 86.3 88.4 88.8 87.1 87.6  PLT 271 285 273 311 254 252   Blood Culture    Component Value Date/Time   SDES TISSUE 06/11/2022 1449   SPECREQUEST LEFT BKA 06/11/2022 1449   CULT  06/11/2022 1449    RARE STAPHYLOCOCCUS AUREUS NO ANAEROBES  ISOLATED; CULTURE IN PROGRESS FOR 5 DAYS    REPTSTATUS PENDING 06/11/2022 1449    Cardiac Enzymes: Recent Labs  Lab 06/10/22 0817  CKTOTAL 128   CBG: Recent Labs  Lab 06/14/22 0730 06/14/22 1124 06/14/22 1804 06/14/22 1951 06/15/22 0704  GLUCAP 216* 229* 158* 155* 193*   Iron Studies: No results for input(s): "IRON", "TIBC", "TRANSFERRIN", "FERRITIN" in the last 72 hours. '@lablastinr3'$ @ Studies/Results: No results found. Medications:  ampicillin-sulbactam (UNASYN) IV 3 g (06/14/22 1809)    (feeding supplement) PROSource Plus  30 mL Oral TID BM   vitamin C  1,000 mg Oral Daily   atorvastatin  40 mg Oral Daily   calcitRIOL  1.25 mcg Oral Q M,W,F-HD   carvedilol  25 mg Oral BID WC   Chlorhexidine Gluconate Cloth  6 each Topical Q0600   darbepoetin (ARANESP) injection - DIALYSIS  200 mcg Subcutaneous Q Mon-1800   feeding supplement (NEPRO CARB STEADY)  237 mL Oral BID BM   Gerhardt's butt cream   Topical BID   heparin  injection (subcutaneous)  5,000 Units Subcutaneous Q8H   insulin aspart  0-15 Units Subcutaneous TID WC   insulin aspart  0-5 Units Subcutaneous QHS   insulin glargine-yfgn  6 Units Subcutaneous Daily   irbesartan  75 mg Oral Daily   lactobacillus  1 g Oral TID WC   multivitamin  1 tablet Oral QHS   nutrition supplement (JUVEN)  1 packet Oral BID BM   pantoprazole  40 mg Oral Daily   sucroferric oxyhydroxide  1,000 mg Oral TID WC   torsemide  100 mg Oral Once per day on Sun Tue Thu Sat   zinc sulfate  220 mg Oral Daily     Dialysis Orders: [facility] MWF 4h 21mn  400/800   108.5kg   2/2 bath  LFA AVF   - No Heparin  - rocaltrol 1.25 mcg po tiw - mircera 225 mcg IV  q 2wks, last 2/12, due 2/26 - last Hb 8.6 on 2/14   Assessment/Plan: 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 IV antibiotics per primary.  Hyponatremia - worsening in the setting of hypervolemia. Corrected Na 126 for hyperglycemia.  ESRD - on HD MWF. Next HD 06/16/2022 HTN/ volume -  Remains very much above OP EDW with hyponatremia. Decreased irbesartan to 75 mg PO daily  to allow for more volume to be removed. Glycemic control plays role in high gains. UF as tolerated and optimize volume with HD.  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 Next ESA due 2/26.  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 Cont po vdra and velphoro as binder.  DM2 per pmd. Chronic hyperglycemia, poorly controlled DMT2.  Nutrition - hypoalbuminemia- Continue protein supplement. Fluid restriction discussed with patient.     Alysiah Suppa H. Talmage Teaster NP-C 06/15/2022, 11:00 AM  [phone]

## 2022-06-15 NOTE — Progress Notes (Signed)
Mobility Specialist Progress Note   06/15/22 1059  Mobility  Activity Transferred from bed to chair  Level of Assistance Minimal assist, patient does 75% or more  Assistive Device Stedy  LLE Weight Bearing NWB  Activity Response Tolerated well  Mobility Referral Yes  $Mobility charge 1 Mobility   Received pt in bed having minor pain in LLE(03/10) but agreeable to mobility. Able to get EOB w/ minG and able to stand w/ minA in stedy, min cues required to initiate hip extension. Transferred to chair w/o fault. Left w/ call bell in reach and chair alarm on   [place] Specialist Please contact via [place] or  Rehab office at [phone]

## 2022-06-15 NOTE — Progress Notes (Signed)
Inpatient Rehab Admissions Coordinator:   I have a bed for pt to admit today.  Dr. Posey Pronto in agreement.  I spoke with wife to confirm discharge plan.  Will plan to admit today.   Shann Medal, PT, DPT Admissions Coordinator [phone] 06/15/22  1:38 PM

## 2022-06-15 NOTE — TOC Transition Note (Signed)
Transition of Care [facility]) - CM/SW Discharge Note   Patient Details  Name: Scott Castillo MRN: [id number] Date of Birth: 1960/07/04  Transition [facility] Care [facility]) CM/SW Contact:  Tom-Johnson, Renea Ee, RN Phone Number: 06/15/2022, 1:26 PM   Clinical Narrative:     Patient is scheduled for discharge to CIR today. Transfer via bed as In-hospital transfer. No further TOC needs noted.         Final next level of care: IP Rehab Facility Barriers to Discharge: Barriers Resolved   Patient Goals and CMS Choice CMS Medicare.gov Compare Post Acute Care list provided to:: Patient Choice offered to / list presented to : Patient  Discharge Placement                  Patient to be transferred to facility by: In-hospital transfer      Discharge Plan and Services Additional resources added to the After Visit Summary for     Discharge Planning Services: CM Consult            DME Arranged: N/A DME Agency: NA       HH Arranged: NA HH Agency: NA        Social Determinants of Health (SDOH) Interventions SDOH Screenings   Food Insecurity: No Food Insecurity (06/08/2022)  Housing: Low Risk  (06/08/2022)  Transportation Needs: No Transportation Needs (06/08/2022)  Utilities: Not At Risk (06/08/2022)  Tobacco Use: Low Risk  (06/14/2022)     Readmission Risk Interventions    06/19/2020    3:04 PM  Readmission Risk Prevention Plan  Transportation Screening Complete  PCP or Specialist Appt within 3-5 Days Complete  HRI or [place] Complete  Social Work Consult for [place] Planning/Counseling Complete  Palliative Care Screening Complete  Medication Review Press photographer) Referral to Pharmacy

## 2022-06-15 NOTE — Plan of Care (Signed)
Wound Treatment Plan  Wounds present:  LLE wound Patient sent by podiatry Phoebe Perch) to hospital for abx tx.  Patient history ESRD, DM, HTN.  LE venous stasis dermatitis Wound type: Neuropathic vs arterial, mixed etiology; heel pressure injury ABIs pending, MRI pending  Left lateral foot: blood filled blister that extends on the plantar surface and to dorsal foot. Areas on the dorsal foot forming eschar Left plantar surface: just medial to above dark purple; deep tissue injury Left heel: Unstageable Pressure Injury; 100% eschar    Pressure Injury POA: Yes Measurement:see nursing flow sheets Wound bed:see above  Drainage (amount, consistency, odor) see nursing flow sheets Periwound: charcot foot deformity  Dressing procedure/placement/frequency: Paint with betadine, cover with xeroform Pending evaluation per ortho vs podiatry.  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

## 2022-06-15 NOTE — H&P (Signed)
Physical Medicine and Rehabilitation Admission H&P    Chief Complaint  Patient presents with   Functional deficits due to BKA    HPI: Scott Castillo is a 62 year old male with history of HTN, lumbar radiculopathy,  T2DM w/nephropathy, retinopathy and neuropathy, ESRD- HD MWF, diabetic foot ulcer s/p abscess drainage and on antibiotics but continued to have progressive progressed to food infection with fracture of calcaneous and concerns of osteomyelitis. He was started on IV antibiotics for foot infection.  MRI of foot showed calcaneal fracture with large abscess and osteomyelitis and Dr. Sharol Given recommended amputation as foot not felt to be salvageable. 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 Tissue cultures of left calf positive for staph aureus and Dr. Sharol Given recommended oral antibiotics X 3 weeks. IV zosyn narrowed to Augmentin today.   Blood sugars have been labile in 200-300 range and meal coverage added per CDE. Patient with hyponatremia and volume overload that is being managed with HD.  He remains over EDW and irbesartan reduced to allow for volume removal. PT/OT evaluations completed  yesterday and patient noted to be limited by weakness, limb loss and balance deficits noted on standing. CIR recommended due to functional decline.  He lives in a home with 3 STE and as independent with use of QC or walker PTA.   Pt reports main pain is residual limb pain- describes no phantom limb pain at all- but says "he expects it to show up sometime".  Pain 3/10 without meds- last meds 2 days ago.  LBM this AM- was loose and incontinent due to ABX.     Review of Systems  Constitutional:  Negative for chills and fever.  HENT:  Negative for hearing loss and tinnitus.   Eyes:  Negative for blurred vision and double vision.  Respiratory:  Positive for cough.   Cardiovascular:  Negative for chest pain.  Gastrointestinal:  Positive for diarrhea (today). Negative for  abdominal pain, heartburn and nausea.  Musculoskeletal:  Positive for joint pain (right hip pain due to fall 3 weeks ago.).  Neurological:  Positive for sensory change and focal weakness (has lost strength in both hands).  All other systems reviewed and are negative.   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   H/O pancreatic cancer    HLD (hyperlipidemia)    HTN (hypertension)    Lumbar radiculopathy, chronic    hemilamectomy '94   Venous stasis dermatitis of both lower extremities     Past Surgical History:  Procedure Laterality Date   A/V FISTULAGRAM Left 09/27/2019   Procedure: A/V FISTULAGRAM;  Surgeon: Marty Heck, MD;  Location: [facility];  Service: Cardiovascular;  Laterality: Left;   AMPUTATION Left 06/11/2022   Procedure: LEFT BELOW THE KNEE AMPUTATION;  Surgeon: Newt Minion, MD;  Location: [facility];  Service: Orthopedics;  Laterality: Left;   AV FISTULA PLACEMENT Left 09/17/2016   Procedure: ARTERIOVENOUS (AV) FISTULA CREATION;  Surgeon: [name], MD;  Location: [facility];  Service: Vascular;  Laterality: Left;   CATARACT EXTRACTION W/ INTRAOCULAR LENS IMPLANT Left 2015   ENDOVENOUS ABLATION SAPHENOUS VEIN W/ LASER Right 04/08/2016   EVLA R greater saphenous vein by Curt Jews MD   ENDOVENOUS ABLATION SAPHENOUS VEIN W/ LASER Left 05/13/2016  endovenous laser ablation (left greater saphenous vein) by Curt Jews MD    FOOT SURGERY     HEMILAMINOTOMY LUMBAR SPINE  '94   TONSILLECTOMY      Family History  Problem Relation Age of Onset   Edema Mother    Heart attack Father    Heart failure Father    Heart attack Brother    Heart attack Brother     Social History:  Married. 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 He reports that he has never smoked. He has never used smokeless  tobacco. He reports that he does not drink alcohol and does not use drugs.   Allergies  Allergen Reactions   Pioglitazone Swelling    Site of swelling not recalled by patient   Adhesive [Tape] Other (See Comments)    Causes redness, pt prefers paper tape    Heparin Other (See Comments)    Per pt he has episodes where he can't speak. He has tolerated this med multiple times since this entered so unlikely it is true allergy   Latex Rash    Redness, also    Medications Prior to Admission  Medication Sig Dispense Refill   ascorbic acid (VITAMIN C) 1000 MG tablet Take 1,000 mg by mouth daily.     atorvastatin (LIPITOR) 40 MG tablet Take 1 tablet (40 mg total) by mouth daily. 30 tablet 0   carvedilol (COREG) 25 MG tablet Take 1 tablet (25 mg total) by mouth 2 (two) times daily with a meal. 60 tablet 0   co-enzyme Q-10 30 MG capsule Take 30 mg by mouth 2 (two) times daily.     cyanocobalamin 1000 MCG tablet Take 1,000 mcg by mouth daily.     glimepiride (AMARYL) 2 MG tablet Take 2 mg by mouth daily with breakfast.     irbesartan (AVAPRO) 150 MG tablet Take 150 mg by mouth daily.     loperamide (IMODIUM) 2 MG capsule Take 1 capsule (2 mg total) by mouth every 6 (six) hours as needed for diarrhea or loose stools. 30 capsule 0   Probiotic Product (UP4 PROBIOTICS) CHEW Chew 1 tablet by mouth daily.     SANTYL 250 UNIT/GM ointment Apply 1 Application topically daily.     Semaglutide (RYBELSUS) 14 MG TABS Take 14 mg by mouth daily.     torsemide (DEMADEX) 100 MG tablet Take 100 mg by mouth See admin instructions. Takes on Tuesday,Thursday,Saturday and Sunday(non-dialysis days)     VELPHORO 500 MG chewable tablet Chew 1,000 mg by mouth 3 (three) times daily with meals.        Home: Home Living Family/patient expects to be discharged to:: Private residence Living Arrangements: Spouse/significant other Available Help at Discharge: Family Type of Home: Mobile home Home Access: Stairs to  enter Technical brewer of Steps: 4 Entrance Stairs-Rails: Right, Left, Can reach both Home Layout: One level Bathroom Shower/Tub: Chiropodist: Handicapped height Bathroom Accessibility: No Home Equipment: Conservation officer, nature (2 wheels), Rollator (4 wheels), BSC/3in1, Grab bars - tub/shower, [employer] - quad Additional Comments: Wife thinks front door and bathroom door may be too narrow for w/c   Functional History: Prior Function Prior Level of Function : Independent/Modified Independent Mobility Comments: Amb with use of quad cane or walker ADLs Comments: independent with the exception of lower body dressing, where his wife would assist  Functional Status:  Mobility: Bed Mobility Overal bed mobility: Needs Assistance Bed Mobility: Sit to Supine Rolling: Min guard Supine  to sit: Min guard Sit to supine: Min guard General bed mobility comments: min gaurd for safety Transfers Overall transfer level: Needs assistance Equipment used: Ambulation equipment used Transfers: Sit to/from Stand Sit to Stand: +2 physical assistance, Max assist Bed to/from chair/wheelchair/BSC transfer type:: Stand pivot Stand pivot transfers: Max assist, +2 physical assistance General transfer comment: +2 max assist using bed pad under hips, flexed posture on stedy bar Ambulation/Gait Pre-gait activities: statis standing with weight shift to L to find midline    ADL: ADL Overall ADL's : Needs assistance/impaired Eating/Feeding: Set up, Sitting Grooming: Set up, Bed level Upper Body Bathing: Min guard, Sitting Lower Body Bathing: Maximal assistance, Total assistance, Sitting/lateral leans, Sit to/from stand Upper Body Dressing : Min guard, Sitting Lower Body Dressing: Maximal assistance, Total assistance, Sitting/lateral leans, Sit to/from stand Toilet Transfer: Moderate assistance, +2 for physical assistance, +2 for safety/equipment, Stand-pivot, Rolling walker (2 wheels) Toilet  Transfer Details (indicate cue type and reason): heavy mod A of 2 initially, but able to complete 4 sit<> stands at [place] and Hygiene: Total assistance, Sit to/from stand Toileting - Clothing Manipulation Details (indicate cue type and reason): in stedy Functional mobility during ADLs: Moderate assistance, +2 for physical assistance, +2 for safety/equipment, Cueing for safety, Cueing for sequencing, Rolling walker (2 wheels) General ADL Comments: Patient presenting with weakness, deficits in balance and stability, and need for increased assist to complete all ADLs  Cognition: Cognition Overall Cognitive Status: Within Functional Limits for tasks assessed Orientation Level: Oriented X4 Cognition Arousal/Alertness: Awake/alert Behavior During Therapy: WFL for tasks assessed/performed Overall Cognitive Status: Within Functional Limits for tasks assessed   Blood pressure 124/89, pulse 89, temperature 98.1 F (36.7 C), temperature source Oral, resp. rate 18, height 6' (1.829 m), weight 114.2 kg, SpO2 94 %. Physical Exam Vitals and nursing note reviewed.  Constitutional:      General: He is not in acute distress.    Appearance: Normal appearance. He is obese.     Comments: Pt appears older than stated age; sleepy- sitting on bedpan; awake, alert, NAD  HENT:     Head: Normocephalic and atraumatic.     Right Ear: External ear normal.     Left Ear: External ear normal.     Nose: Nose normal. No congestion.     Mouth/Throat:     Mouth: Mucous membranes are dry.     Pharynx: Oropharynx is clear. No oropharyngeal exudate.  Eyes:     General:        Right eye: No discharge.        Left eye: No discharge.     Extraocular Movements: Extraocular movements intact.  Cardiovascular:     Rate and Rhythm: Normal rate and regular rhythm.     Heart sounds: Normal heart sounds. No murmur heard.    No gallop.  Pulmonary:     Effort: Pulmonary effort is normal. No  respiratory distress.     Breath sounds: Examination of the right-upper field reveals rhonchi. Rhonchi present. No wheezing or rales.     Comments: Increased WOB at times with conversation. Intermittent cough.  Rhonchi diffusely but mild- no W/R Abdominal:     General: Bowel sounds are normal. There is no distension.     Palpations: Abdomen is soft.     Tenderness: There is no abdominal tenderness.     Comments: Small umbilical hernia noted.  Slightly hyperactive BS  Musculoskeletal:     Cervical back: Neck supple. No tenderness.  Comments: UE strength 5/5 in Biceps, triceps, WE, grip and FA B/L RLE- 5/5 in HF, KE, KF- but moderate foot drop- DF 3-/5; and PF 3+/5 LLE- HF/KE/KF 5-/5- after limb guard removed VAC in place on L BKA  Skin:    General: Skin is warm and dry.     Comments: Small scabs on right great toe. Stasis changes with papery skin right shin and healed abrasions. Limb guard with wound VAC on L-BKA. Venous stasis changes on RLE- with a few papery scabs- no drainage- fistula on L Forearm 2 IV"s R hand and R forearm- look OK  Neurological:     Mental Status: He is alert and oriented to person, place, and time.     Cranial Nerves: No cranial nerve deficit.     Comments: Decreased to light touch from R knee downwards; and on LLE/BKA as well  Psychiatric:     Comments: Slightly flat, but overall interactive     Results for orders placed or performed during the hospital encounter of 06/08/22 (from the past 48 hour(s))  Glucose, capillary     Status: Abnormal   Collection Time: 06/13/22 11:31 AM  Result Value Ref Range   Glucose-Capillary 263 (H) 70 - 99 mg/dL    Comment: Glucose reference range applies only to samples taken after fasting for at least 8 hours.  Glucose, capillary     Status: Abnormal   Collection Time: 06/13/22  4:50 PM  Result Value Ref Range   Glucose-Capillary 305 (H) 70 - 99 mg/dL    Comment: Glucose reference range applies only to samples taken  after fasting for at least 8 hours.  Glucose, capillary     Status: Abnormal   Collection Time: 06/13/22 11:20 PM  Result Value Ref Range   Glucose-Capillary 218 (H) 70 - 99 mg/dL    Comment: Glucose reference range applies only to samples taken after fasting for at least 8 hours.  Renal function panel     Status: Abnormal   Collection Time: 06/14/22  2:26 AM  Result Value Ref Range   Sodium 124 (L) 135 - 145 mmol/L   Potassium 3.9 3.5 - 5.1 mmol/L   Chloride 89 (L) 98 - 111 mmol/L   CO2 23 22 - 32 mmol/L   Glucose, Bld 225 (H) 70 - 99 mg/dL    Comment: Glucose reference range applies only to samples taken after fasting for at least 8 hours.   BUN 53 (H) 6 - 20 mg/dL   Creatinine, Ser 5.49 (H) 0.61 - 1.24 mg/dL   Calcium 8.4 (L) 8.9 - 10.3 mg/dL   Phosphorus 4.3 2.5 - 4.6 mg/dL   Albumin 1.6 (L) 3.5 - 5.0 g/dL   GFR, Estimated 11 (L) >60 mL/min    Comment: (NOTE) Calculated using the CKD-EPI Creatinine Equation (2021)    Anion gap 12 5 - 15    Comment: Performed at [facility] [address], [place], [place] [address]  CBC     Status: Abnormal   Collection Time: 06/14/22  2:26 AM  Result Value Ref Range   WBC 20.4 (H) 4.0 - 10.5 K/uL   RBC 3.03 (L) 4.22 - 5.81 MIL/uL   Hemoglobin 8.5 (L) 13.0 - 17.0 g/dL   HCT 26.4 (L) 39.0 - 52.0 %   MCV 87.1 80.0 - 100.0 fL   MCH 28.1 26.0 - 34.0 pg   MCHC 32.2 30.0 - 36.0 g/dL   RDW 17.3 (H) 11.5 - 15.5 %  Platelets 254 150 - 400 K/uL   nRBC 0.0 0.0 - 0.2 %    Comment: Performed at [facility], [place] [address], [place], [place] [address]  Magnesium     Status: None   Collection Time: 06/14/22  2:26 AM  Result Value Ref Range   Magnesium 1.9 1.7 - 2.4 mg/dL    Comment: Performed at [facility] [address] [place], [place] [address]  Glucose, capillary     Status: Abnormal   Collection Time: 06/14/22  7:30 AM  Result Value Ref Range   Glucose-Capillary 216 (H) 70 - 99 mg/dL    Comment: Glucose reference  range applies only to samples taken after fasting for at least 8 hours.  Glucose, capillary     Status: Abnormal   Collection Time: 06/14/22 11:24 AM  Result Value Ref Range   Glucose-Capillary 229 (H) 70 - 99 mg/dL    Comment: Glucose reference range applies only to samples taken after fasting for at least 8 hours.  Glucose, capillary     Status: Abnormal   Collection Time: 06/14/22  6:04 PM  Result Value Ref Range   Glucose-Capillary 158 (H) 70 - 99 mg/dL    Comment: Glucose reference range applies only to samples taken after fasting for at least 8 hours.  Glucose, capillary     Status: Abnormal   Collection Time: 06/14/22  7:51 PM  Result Value Ref Range   Glucose-Capillary 155 (H) 70 - 99 mg/dL    Comment: Glucose reference range applies only to samples taken after fasting for at least 8 hours.  Renal function panel     Status: Abnormal   Collection Time: 06/15/22  4:24 AM  Result Value Ref Range   Sodium 127 (L) 135 - 145 mmol/L   Potassium 3.9 3.5 - 5.1 mmol/L   Chloride 90 (L) 98 - 111 mmol/L   CO2 21 (L) 22 - 32 mmol/L   Glucose, Bld 198 (H) 70 - 99 mg/dL    Comment: Glucose reference range applies only to samples taken after fasting for at least 8 hours.   BUN 59 (H) 6 - 20 mg/dL   Creatinine, Ser 6.28 (H) 0.61 - 1.24 mg/dL   Calcium 9.0 8.9 - 10.3 mg/dL   Phosphorus 5.0 (H) 2.5 - 4.6 mg/dL   Albumin 1.6 (L) 3.5 - 5.0 g/dL   GFR, Estimated 10 (L) >60 mL/min    Comment: (NOTE) Calculated using the CKD-EPI Creatinine Equation (2021)    Anion gap 16 (H) 5 - 15    Comment: Performed at [facility] [address] [place], [place] [address]  CBC     Status: Abnormal   Collection Time: 06/15/22  4:24 AM  Result Value Ref Range   WBC 16.7 (H) 4.0 - 10.5 K/uL   RBC 3.06 (L) 4.22 - 5.81 MIL/uL   Hemoglobin 8.5 (L) 13.0 - 17.0 g/dL   HCT 26.8 (L) 39.0 - 52.0 %   MCV 87.6 80.0 - 100.0 fL   MCH 27.8 26.0 - 34.0 pg   MCHC 31.7 30.0 - 36.0 g/dL   RDW 17.6 (H) 11.5 -  15.5 %   Platelets 252 150 - 400 K/uL   nRBC 0.0 0.0 - 0.2 %    Comment: Performed at [facility], [place] [address], [place], [place] [address]  Magnesium     Status: None   Collection Time: 06/15/22  4:24 AM  Result Value Ref  Range   Magnesium 2.0 1.7 - 2.4 mg/dL    Comment: Performed at [facility] [address] [place], [place] [address]  Glucose, capillary     Status: Abnormal   Collection Time: 06/15/22  7:04 AM  Result Value Ref Range   Glucose-Capillary 193 (H) 70 - 99 mg/dL    Comment: Glucose reference range applies only to samples taken after fasting for at least 8 hours.   No results found.    Blood pressure 124/89, pulse 89, temperature 98.1 F (36.7 C), temperature source Oral, resp. rate 18, height 6' (1.829 m), weight 114.2 kg, SpO2 94 %.  Medical Problem List and Plan: 1. Functional deficits secondary to L BKA due to osteomyelitis of LLE  -patient may  shower after VAC removed  -ELOS/Goals: 10-14 days supervision at w/c level 2.  Antithrombotics: -DVT/anticoagulation:  Pharmaceutical: Heparin  -antiplatelet therapy: NA 3. Pain Management: Oxycodone prn for pain. No phantom pain so far 4. Mood/Behavior/Sleep: LCSW to follow for evaluation and support.   -antipsychotic agents: N/A 5. Neuropsych/cognition: This patient is capable of making decisions on his own behalf. 6. Skin/Wound Care: Routine pressure relief measures.  7. Fluids/Electrolytes/Nutrition: Strict I/O. Daily weights. Enforce 1200 cc FR.  --Reports on multiple supplements--d/c nephro.  --Continue Juven bid, Zinc and Vitamin-C to promote wound healing.  8. Left calf staph positive cultures: IV antibiotics changed to Augmentin X 19 days per recs.   9. HTN: Monitor BP TID--continue Avapro (reduced to '75mg'$  2/27) and Demadex -STTS.  10. T2DM: Hgb A1C- 7.4. Was on Glimepiride and Rubelsus PTA.  -- Monitor BS ac/hs and use SSI for tighter control.   --continue Insulin glargine w/ 2 units  novolog TID AC  11. ESRD: Volume being managed with HD MWF. Is basically anuric- voided x2 since admission. Via fistula --Will schedule HD at the end of the day to help with tolerance of therapy.  12.  Leucocytosis due to LLE osteomyelitis- : Monitor for fevers and other signs of infection.   --WBC resolving 16.4-->22.2-->16.7 13. Hyponatremia: Being managed with HD.  14. Cough: SOB with cough noted during exam-->he reports at baseline X 3 weeks and it due to dry air?  --? Due to fluid overload. Will order throat spray and CXR.   --encourage pulmonary hygiene.  15. Right hip pain: Fall 3 weeks PTA. Will order Voltaren gel TID.  16. Loose stools- due to IV ABX per pt- might need a stool bulker/fiber-     Bary Leriche, PA-C 06/15/2022   I have personally performed a face to face diagnostic evaluation of this patient and formulated the key components of the plan.  Additionally, I have personally reviewed laboratory data, imaging studies, as well as relevant notes and concur with the physician assistant's documentation above.   The patient's status has not changed from the original H&P.  Any changes in documentation from the acute care chart have been noted above.

## 2022-06-15 NOTE — H&P (Signed)
Physical Medicine and Rehabilitation Admission H&P        Chief Complaint  Patient presents with   Functional deficits due to BKA      HPI: Scott Castillo is a 62 year old male with history of HTN, lumbar radiculopathy,  T2DM w/nephropathy, retinopathy and neuropathy, ESRD- HD MWF, diabetic foot ulcer s/p abscess drainage and on antibiotics but continued to have progressive progressed to food infection with fracture of calcaneous and concerns of osteomyelitis. He was started on IV antibiotics for foot infection.  MRI of foot showed calcaneal fracture with large abscess and osteomyelitis and Dr. Sharol Given recommended amputation as foot not felt to be salvageable. 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 Tissue cultures of left calf positive for staph aureus and Dr. Sharol Given recommended oral antibiotics X 3 weeks. IV zosyn narrowed to Augmentin today.    Blood sugars have been labile in 200-300 range and meal coverage added per CDE. Patient with hyponatremia and volume overload that is being managed with HD.  He remains over EDW and irbesartan reduced to allow for volume removal. PT/OT evaluations completed  yesterday and patient noted to be limited by weakness, limb loss and balance deficits noted on standing. CIR recommended due to functional decline.  He lives in a home with 3 STE and as independent with use of QC or walker PTA.    Pt reports main pain is residual limb pain- describes no phantom limb pain at all- but says "he expects it to show up sometime".  Pain 3/10 without meds- last meds 2 days ago.  LBM this AM- was loose and incontinent due to ABX.        Review of Systems  Constitutional:  Negative for chills and fever.  HENT:  Negative for hearing loss and tinnitus.   Eyes:  Negative for blurred vision and double vision.  Respiratory:  Positive for cough.   Cardiovascular:  Negative for chest pain.  Gastrointestinal:  Positive for diarrhea (today).  Negative for abdominal pain, heartburn and nausea.  Musculoskeletal:  Positive for joint pain (right hip pain due to fall 3 weeks ago.).  Neurological:  Positive for sensory change and focal weakness (has lost strength in both hands).  All other systems reviewed and are negative.         Past Medical History:  Diagnosis Date   C7 radiculopathy     Carpal tunnel syndrome, bilateral      by EMG  R>L   DM (diabetes mellitus), type 2 with neurological complications (HCC)      polyneuropathy   H/O pancreatic cancer     HLD (hyperlipidemia)     HTN (hypertension)     Lumbar radiculopathy, chronic      hemilamectomy '94   Venous stasis dermatitis of both lower extremities             Past Surgical History:  Procedure Laterality Date   A/V FISTULAGRAM Left 09/27/2019    Procedure: A/V FISTULAGRAM;  Surgeon: Marty Heck, MD;  Location: [facility];  Service: Cardiovascular;  Laterality: Left;   AMPUTATION Left 06/11/2022    Procedure: LEFT BELOW THE KNEE AMPUTATION;  Surgeon: Newt Minion, MD;  Location: [facility];  Service: Orthopedics;  Laterality: Left;   AV FISTULA PLACEMENT Left 09/17/2016    Procedure: ARTERIOVENOUS (AV) FISTULA CREATION;  Surgeon: [name], MD;  Location: [facility];  Service: Vascular;  Laterality: Left;  CATARACT EXTRACTION W/ INTRAOCULAR LENS IMPLANT Left 2015   ENDOVENOUS ABLATION SAPHENOUS VEIN W/ LASER Right 04/08/2016    EVLA R greater saphenous vein by Curt Jews MD   ENDOVENOUS ABLATION SAPHENOUS VEIN W/ LASER Left 05/13/2016    endovenous laser ablation (left greater saphenous vein) by Curt Jews MD    FOOT SURGERY       HEMILAMINOTOMY LUMBAR SPINE   '94   TONSILLECTOMY               Family History  Problem Relation Age of Onset   Edema Mother     Heart attack Father     Heart failure Father     Heart attack Brother     Heart attack Brother        Social History:  Married. 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 He  reports that he has never smoked. He has never used smokeless tobacco. He reports that he does not drink alcohol and does not use drugs.          Allergies  Allergen Reactions   Pioglitazone Swelling      Site of swelling not recalled by patient   Adhesive [Tape] Other (See Comments)      Causes redness, pt prefers paper tape    Heparin Other (See Comments)      Per pt he has episodes where he can't speak. He has tolerated this med multiple times since this entered so unlikely it is true allergy   Latex Rash      Redness, also            Medications Prior to Admission  Medication Sig Dispense Refill   ascorbic acid (VITAMIN C) 1000 MG tablet Take 1,000 mg by mouth daily.       atorvastatin (LIPITOR) 40 MG tablet Take 1 tablet (40 mg total) by mouth daily. 30 tablet 0   carvedilol (COREG) 25 MG tablet Take 1 tablet (25 mg total) by mouth 2 (two) times daily with a meal. 60 tablet 0   co-enzyme Q-10 30 MG capsule Take 30 mg by mouth 2 (two) times daily.       cyanocobalamin 1000 MCG tablet Take 1,000 mcg by mouth daily.       glimepiride (AMARYL) 2 MG tablet Take 2 mg by mouth daily with breakfast.       irbesartan (AVAPRO) 150 MG tablet Take 150 mg by mouth daily.       loperamide (IMODIUM) 2 MG capsule Take 1 capsule (2 mg total) by mouth every 6 (six) hours as needed for diarrhea or loose stools. 30 capsule 0   Probiotic Product (UP4 PROBIOTICS) CHEW Chew 1 tablet by mouth daily.       SANTYL 250 UNIT/GM ointment Apply 1 Application topically daily.       Semaglutide (RYBELSUS) 14 MG TABS Take 14 mg by mouth daily.       torsemide (DEMADEX) 100 MG tablet Take 100 mg by mouth See admin instructions. Takes on Tuesday,Thursday,Saturday and Sunday(non-dialysis days)       VELPHORO 500 MG chewable tablet Chew 1,000 mg by mouth 3 (three) times daily with meals.              Home: Home Living Family/patient expects to be discharged to:: Private residence Living Arrangements:  Spouse/significant other Available Help at Discharge: Family Type of Home: Mobile home Home Access: Stairs to enter Entrance Stairs-Number of Steps: 4 Entrance Stairs-Rails: Right, Left,  Can reach both Home Layout: One level Bathroom Shower/Tub: Chiropodist: Handicapped height Bathroom Accessibility: No Home Equipment: Conservation officer, nature (2 wheels), Rollator (4 wheels), BSC/3in1, Grab bars - tub/shower, Cane - quad Additional Comments: Wife thinks front door and bathroom door may be too narrow for w/c   Functional History: Prior Function Prior Level of Function : Independent/Modified Independent Mobility Comments: Amb with use of quad cane or walker ADLs Comments: independent with the exception of lower body dressing, where his wife would assist   Functional Status:  Mobility: Bed Mobility Overal bed mobility: Needs Assistance Bed Mobility: Sit to Supine Rolling: Min guard Supine to sit: Min guard Sit to supine: Min guard General bed mobility comments: min gaurd for safety Transfers Overall transfer level: Needs assistance Equipment used: Ambulation equipment used Transfers: Sit to/from Stand Sit to Stand: +2 physical assistance, Max assist Bed to/from chair/wheelchair/BSC transfer type:: Stand pivot Stand pivot transfers: Max assist, +2 physical assistance General transfer comment: +2 max assist using bed pad under hips, flexed posture on stedy bar Ambulation/Gait Pre-gait activities: statis standing with weight shift to L to find midline   ADL: ADL Overall ADL's : Needs assistance/impaired Eating/Feeding: Set up, Sitting Grooming: Set up, Bed level Upper Body Bathing: Min guard, Sitting Lower Body Bathing: Maximal assistance, Total assistance, Sitting/lateral leans, Sit to/from stand Upper Body Dressing : Min guard, Sitting Lower Body Dressing: Maximal assistance, Total assistance, Sitting/lateral leans, Sit to/from stand Toilet Transfer: Moderate  assistance, +2 for physical assistance, +2 for safety/equipment, Stand-pivot, Rolling walker (2 wheels) Toilet Transfer Details (indicate cue type and reason): heavy mod A of 2 initially, but able to complete 4 sit<> stands at [place] and Hygiene: Total assistance, Sit to/from stand Toileting - Clothing Manipulation Details (indicate cue type and reason): in stedy Functional mobility during ADLs: Moderate assistance, +2 for physical assistance, +2 for safety/equipment, Cueing for safety, Cueing for sequencing, Rolling walker (2 wheels) General ADL Comments: Patient presenting with weakness, deficits in balance and stability, and need for increased assist to complete all ADLs   Cognition: Cognition Overall Cognitive Status: Within Functional Limits for tasks assessed Orientation Level: Oriented X4 Cognition Arousal/Alertness: Awake/alert Behavior During Therapy: WFL for tasks assessed/performed Overall Cognitive Status: Within Functional Limits for tasks assessed     Blood pressure 124/89, pulse 89, temperature 98.1 F (36.7 C), temperature source Oral, resp. rate 18, height 6' (1.829 m), weight 114.2 kg, SpO2 94 %. Physical Exam Vitals and nursing note reviewed.  Constitutional:      General: He is not in acute distress.    Appearance: Normal appearance. He is obese.     Comments: Pt appears older than stated age; sleepy- sitting on bedpan; awake, alert, NAD  HENT:     Head: Normocephalic and atraumatic.     Right Ear: External ear normal.     Left Ear: External ear normal.     Nose: Nose normal. No congestion.     Mouth/Throat:     Mouth: Mucous membranes are dry.     Pharynx: Oropharynx is clear. No oropharyngeal exudate.  Eyes:     General:        Right eye: No discharge.        Left eye: No discharge.     Extraocular Movements: Extraocular movements intact.  Cardiovascular:     Rate and Rhythm: Normal rate and regular rhythm.     Heart sounds:  Normal heart sounds. No murmur heard.    No  gallop.  Pulmonary:     Effort: Pulmonary effort is normal. No respiratory distress.     Breath sounds: Examination of the right-upper field reveals rhonchi. Rhonchi present. No wheezing or rales.     Comments: Increased WOB at times with conversation. Intermittent cough.  Rhonchi diffusely but mild- no W/R Abdominal:     General: Bowel sounds are normal. There is no distension.     Palpations: Abdomen is soft.     Tenderness: There is no abdominal tenderness.     Comments: Small umbilical hernia noted.  Slightly hyperactive BS  Musculoskeletal:     Cervical back: Neck supple. No tenderness.     Comments: UE strength 5/5 in Biceps, triceps, WE, grip and FA B/L RLE- 5/5 in HF, KE, KF- but moderate foot drop- DF 3-/5; and PF 3+/5 LLE- HF/KE/KF 5-/5- after limb guard removed VAC in place on L BKA  Skin:    General: Skin is warm and dry.     Comments: Small scabs on right great toe. Stasis changes with papery skin right shin and healed abrasions. Limb guard with wound VAC on L-BKA. Venous stasis changes on RLE- with a few papery scabs- no drainage- fistula on L Forearm 2 IV"s R hand and R forearm- look OK  Neurological:     Mental Status: He is alert and oriented to person, place, and time.     Cranial Nerves: No cranial nerve deficit.     Comments: Decreased to light touch from R knee downwards; and on LLE/BKA as well  Psychiatric:     Comments: Slightly flat, but overall interactive        Lab Results Last 48 Hours        Results for orders placed or performed during the hospital encounter of 06/08/22 (from the past 48 hour(s))  Glucose, capillary     Status: Abnormal    Collection Time: 06/13/22 11:31 AM  Result Value Ref Range    Glucose-Capillary 263 (H) 70 - 99 mg/dL      Comment: Glucose reference range applies only to samples taken after fasting for at least 8 hours.  Glucose, capillary     Status: Abnormal    Collection Time:  06/13/22  4:50 PM  Result Value Ref Range    Glucose-Capillary 305 (H) 70 - 99 mg/dL      Comment: Glucose reference range applies only to samples taken after fasting for at least 8 hours.  Glucose, capillary     Status: Abnormal    Collection Time: 06/13/22 11:20 PM  Result Value Ref Range    Glucose-Capillary 218 (H) 70 - 99 mg/dL      Comment: Glucose reference range applies only to samples taken after fasting for at least 8 hours.  Renal function panel     Status: Abnormal    Collection Time: 06/14/22  2:26 AM  Result Value Ref Range    Sodium 124 (L) 135 - 145 mmol/L    Potassium 3.9 3.5 - 5.1 mmol/L    Chloride 89 (L) 98 - 111 mmol/L    CO2 23 22 - 32 mmol/L    Glucose, Bld 225 (H) 70 - 99 mg/dL      Comment: Glucose reference range applies only to samples taken after fasting for at least 8 hours.    BUN 53 (H) 6 - 20 mg/dL    Creatinine, Ser 5.49 (H) 0.61 - 1.24 mg/dL    Calcium 8.4 (L) 8.9 - 10.3 mg/dL  Phosphorus 4.3 2.5 - 4.6 mg/dL    Albumin 1.6 (L) 3.5 - 5.0 g/dL    GFR, Estimated 11 (L) >60 mL/min      Comment: (NOTE) Calculated using the CKD-EPI Creatinine Equation (2021)      Anion gap 12 5 - 15      Comment: Performed at [facility] [address] [place], [place] [address]  CBC     Status: Abnormal    Collection Time: 06/14/22  2:26 AM  Result Value Ref Range    WBC 20.4 (H) 4.0 - 10.5 K/uL    RBC 3.03 (L) 4.22 - 5.81 MIL/uL    Hemoglobin 8.5 (L) 13.0 - 17.0 g/dL    HCT 26.4 (L) 39.0 - 52.0 %    MCV 87.1 80.0 - 100.0 fL    MCH 28.1 26.0 - 34.0 pg    MCHC 32.2 30.0 - 36.0 g/dL    RDW 17.3 (H) 11.5 - 15.5 %    Platelets 254 150 - 400 K/uL    nRBC 0.0 0.0 - 0.2 %      Comment: Performed at [facility] [address] [place], [place] [address]  Magnesium     Status: None    Collection Time: 06/14/22  2:26 AM  Result Value Ref Range    Magnesium 1.9 1.7 - 2.4 mg/dL      Comment: Performed at [facility] [address],  [place], [place] [address]  Glucose, capillary     Status: Abnormal    Collection Time: 06/14/22  7:30 AM  Result Value Ref Range    Glucose-Capillary 216 (H) 70 - 99 mg/dL      Comment: Glucose reference range applies only to samples taken after fasting for at least 8 hours.  Glucose, capillary     Status: Abnormal    Collection Time: 06/14/22 11:24 AM  Result Value Ref Range    Glucose-Capillary 229 (H) 70 - 99 mg/dL      Comment: Glucose reference range applies only to samples taken after fasting for at least 8 hours.  Glucose, capillary     Status: Abnormal    Collection Time: 06/14/22  6:04 PM  Result Value Ref Range    Glucose-Capillary 158 (H) 70 - 99 mg/dL      Comment: Glucose reference range applies only to samples taken after fasting for at least 8 hours.  Glucose, capillary     Status: Abnormal    Collection Time: 06/14/22  7:51 PM  Result Value Ref Range    Glucose-Capillary 155 (H) 70 - 99 mg/dL      Comment: Glucose reference range applies only to samples taken after fasting for at least 8 hours.  Renal function panel     Status: Abnormal    Collection Time: 06/15/22  4:24 AM  Result Value Ref Range    Sodium 127 (L) 135 - 145 mmol/L    Potassium 3.9 3.5 - 5.1 mmol/L    Chloride 90 (L) 98 - 111 mmol/L    CO2 21 (L) 22 - 32 mmol/L    Glucose, Bld 198 (H) 70 - 99 mg/dL      Comment: Glucose reference range applies only to samples taken after fasting for at least 8 hours.    BUN 59 (H) 6 - 20 mg/dL    Creatinine, Ser 6.28 (H) 0.61 - 1.24 mg/dL    Calcium 9.0 8.9 - 10.3 mg/dL    Phosphorus  5.0 (H) 2.5 - 4.6 mg/dL    Albumin 1.6 (L) 3.5 - 5.0 g/dL    GFR, Estimated 10 (L) >60 mL/min      Comment: (NOTE) Calculated using the CKD-EPI Creatinine Equation (2021)      Anion gap 16 (H) 5 - 15      Comment: Performed at [facility] [address], [place], [place] [address]  CBC     Status: Abnormal    Collection Time: 06/15/22  4:24 AM  Result Value Ref Range    WBC  16.7 (H) 4.0 - 10.5 K/uL    RBC 3.06 (L) 4.22 - 5.81 MIL/uL    Hemoglobin 8.5 (L) 13.0 - 17.0 g/dL    HCT 26.8 (L) 39.0 - 52.0 %    MCV 87.6 80.0 - 100.0 fL    MCH 27.8 26.0 - 34.0 pg    MCHC 31.7 30.0 - 36.0 g/dL    RDW 17.6 (H) 11.5 - 15.5 %    Platelets 252 150 - 400 K/uL    nRBC 0.0 0.0 - 0.2 %      Comment: Performed at [facility], [place] [address], [place], [place] [address]  Magnesium     Status: None    Collection Time: 06/15/22  4:24 AM  Result Value Ref Range    Magnesium 2.0 1.7 - 2.4 mg/dL      Comment: Performed at [facility] [address] [place], [place] [address]  Glucose, capillary     Status: Abnormal    Collection Time: 06/15/22  7:04 AM  Result Value Ref Range    Glucose-Capillary 193 (H) 70 - 99 mg/dL      Comment: Glucose reference range applies only to samples taken after fasting for at least 8 hours.      Imaging Results (Last 48 hours)  No results found.         Blood pressure 124/89, pulse 89, temperature 98.1 F (36.7 C), temperature source Oral, resp. rate 18, height 6' (1.829 m), weight 114.2 kg, SpO2 94 %.   Medical Problem List and Plan: 1. Functional deficits secondary to L BKA due to osteomyelitis of LLE             -patient may  shower after VAC removed             -ELOS/Goals: 10-14 days supervision at w/c level 2.  Antithrombotics: -DVT/anticoagulation:  Pharmaceutical: Heparin             -antiplatelet therapy: NA 3. Pain Management: Oxycodone prn for pain. No phantom pain so far 4. Mood/Behavior/Sleep: LCSW to follow for evaluation and support.              -antipsychotic agents: N/A 5. Neuropsych/cognition: This patient is capable of making decisions on his own behalf. 6. Skin/Wound Care: Routine pressure relief measures.  7. Fluids/Electrolytes/Nutrition: Strict I/O. Daily weights. Enforce 1200 cc FR.             --Reports on multiple supplements--d/c nephro.  --Continue Juven bid, Zinc and Vitamin-C to promote  wound healing.  8. Left calf staph positive cultures: IV antibiotics changed to Augmentin X 19 days per recs.   9. HTN: Monitor BP TID--continue Avapro (reduced to '75mg'$  2/27) and Demadex -STTS.  10. T2DM: Hgb A1C- 7.4. Was on Glimepiride and Rubelsus PTA.  -- Monitor BS ac/hs and use SSI for tighter control.              --  continue Insulin glargine w/ 2 units novolog TID AC  11. ESRD: Volume being managed with HD MWF. Is basically anuric- voided x2 since admission. Via fistula --Will schedule HD at the end of the day to help with tolerance of therapy.  12.  Leucocytosis due to LLE osteomyelitis- : Monitor for fevers and other signs of infection.              --WBC resolving 16.4-->22.2-->16.7 13. Hyponatremia: Being managed with HD.  14. Cough: SOB with cough noted during exam-->he reports at baseline X 3 weeks and it due to dry air?             --? Due to fluid overload. Will order throat spray and CXR.              --encourage pulmonary hygiene.  15. Right hip pain: Fall 3 weeks PTA. Will order Voltaren gel TID.  16. Loose stools- due to IV ABX per pt- might need a stool bulker/fiber-        Bary Leriche, PA-C 06/15/2022     I have personally performed a face to face diagnostic evaluation of this patient and formulated the key components of the plan.  Additionally, I have personally reviewed laboratory data, imaging studies, as well as relevant notes and concur with the physician assistant's documentation above.   The patient's status has not changed from the original H&P.  Any changes in documentation from the acute care chart have been noted above.

## 2022-06-15 NOTE — Plan of Care (Signed)
Problem: Education: Goal: Knowledge of General Education information will improve Description: Including pain rating scale, medication(s)/side effects and non-pharmacologic comfort measures Outcome: Adequate for Discharge   Problem: Health Behavior/Discharge Planning: Goal: Ability to manage health-related needs will improve Outcome: Adequate for Discharge   Problem: Clinical Measurements: Goal: Ability to maintain clinical measurements within normal limits will improve Outcome: Adequate for Discharge Goal: Will remain free from infection Outcome: Adequate for Discharge Goal: Diagnostic test results will improve Outcome: Adequate for Discharge Goal: Respiratory complications will improve Outcome: Adequate for Discharge Goal: Cardiovascular complication will be avoided Outcome: Adequate for Discharge   Problem: Activity: Goal: Risk for activity intolerance will decrease Outcome: Adequate for Discharge   Problem: Nutrition: Goal: Adequate nutrition will be maintained Outcome: Adequate for Discharge   Problem: Coping: Goal: Level of anxiety will decrease Outcome: Adequate for Discharge   Problem: Elimination: Goal: Will not experience complications related to bowel motility Outcome: Adequate for Discharge Goal: Will not experience complications related to urinary retention Outcome: Adequate for Discharge   Problem: Pain Managment: Goal: General experience of comfort will improve Outcome: Adequate for Discharge   Problem: Safety: Goal: Ability to remain free from injury will improve Outcome: Adequate for Discharge   Problem: Skin Integrity: Goal: Risk for impaired skin integrity will decrease Outcome: Adequate for Discharge   Problem: Education: Goal: Knowledge of disease and its progression will improve Outcome: Adequate for Discharge Goal: Individualized Educational Video(s) Outcome: Adequate for Discharge   Problem: Fluid Volume: Goal: Compliance with  measures to maintain balanced fluid volume will improve Outcome: Adequate for Discharge   Problem: Health Behavior/Discharge Planning: Goal: Ability to manage health-related needs will improve Outcome: Adequate for Discharge   Problem: Nutritional: Goal: Ability to make healthy dietary choices will improve Outcome: Adequate for Discharge   Problem: Clinical Measurements: Goal: Complications related to the disease process, condition or treatment will be avoided or minimized Outcome: Adequate for Discharge   Problem: Education: Goal: Ability to describe self-care measures that may prevent or decrease complications (Diabetes Survival Skills Education) will improve Outcome: Adequate for Discharge Goal: Individualized Educational Video(s) Outcome: Adequate for Discharge   Problem: Coping: Goal: Ability to adjust to condition or change in health will improve Outcome: Adequate for Discharge   Problem: Fluid Volume: Goal: Ability to maintain a balanced intake and output will improve Outcome: Adequate for Discharge   Problem: Health Behavior/Discharge Planning: Goal: Ability to identify and utilize available resources and services will improve Outcome: Adequate for Discharge Goal: Ability to manage health-related needs will improve Outcome: Adequate for Discharge   Problem: Metabolic: Goal: Ability to maintain appropriate glucose levels will improve Outcome: Adequate for Discharge   Problem: Nutritional: Goal: Maintenance of adequate nutrition will improve Outcome: Adequate for Discharge Goal: Progress toward achieving an optimal weight will improve Outcome: Adequate for Discharge   Problem: Skin Integrity: Goal: Risk for impaired skin integrity will decrease Outcome: Adequate for Discharge   Problem: Tissue Perfusion: Goal: Adequacy of tissue perfusion will improve Outcome: Adequate for Discharge   Problem: Education: Goal: Knowledge of the prescribed therapeutic regimen  will improve Outcome: Adequate for Discharge Goal: Ability to verbalize activity precautions or restrictions will improve Outcome: Adequate for Discharge Goal: Understanding of discharge needs will improve Outcome: Adequate for Discharge   Problem: Activity: Goal: Ability to perform//tolerate increased activity and mobilize with assistive devices will improve Outcome: Adequate for Discharge   Problem: Clinical Measurements: Goal: Postoperative complications will be avoided or minimized Outcome: Adequate for Discharge   Problem:  Self-Care: Goal: Ability to meet self-care needs will improve Outcome: Adequate for Discharge   Problem: Self-Concept: Goal: Ability to maintain and perform role responsibilities to the fullest extent possible will improve Outcome: Adequate for Discharge   Problem: Pain Management: Goal: Pain level will decrease with appropriate interventions Outcome: Adequate for Discharge   Problem: Skin Integrity: Goal: Demonstration of wound healing without infection will improve Outcome: Adequate for Discharge

## 2022-06-15 NOTE — Progress Notes (Deleted)
Inpatient Rehabilitation Admission Medication Review by a Pharmacist  A complete drug regimen review was completed for this patient to identify any potential clinically significant medication issues.  High Risk Drug Classes Is patient taking? 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    Other  PTA meds: Amaryl rybelsus Restart PTA meds when and if necessary during CIR admission or at time of discharge, if warranted    Clinically significant medication issues were identified that warrant physician communication and completion of prescribed/recommended actions by midnight of the next day:  No   Time spent performing this drug regimen review (minutes):  30   Daronte Shostak BS, PharmD, BCPS Clinical Pharmacist 06/15/2022 2:08 PM  Contact: [phone] after 3 PM  "Be curious, not judgmental..." -Jamal Maes

## 2022-06-15 NOTE — Progress Notes (Signed)
INPATIENT REHABILITATION ADMISSION NOTE   Arrival Method: bed     Mental Orientation:alert   Assessment:done   Skin:done   IV'S:right forearm   Pain: none   Tubes and Drains: wound vac   Safety Measures: done  Vital Signs:done   Height and Weight:done   Rehab Orientation:done   Family:none  Glenis Smoker BSN, RN-BC, WTA  Notes:

## 2022-06-15 NOTE — Progress Notes (Signed)
Inpatient Rehabilitation Admission Medication Review by a Pharmacist  A complete drug regimen review was completed for this patient to identify any potential clinically significant medication issues.  High Risk Drug Classes Is patient taking? 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    Other  PTA meds: Amaryl rybelsus Restart PTA meds when and if necessary during CIR admission or at time of discharge, if warranted    Clinically significant medication issues were identified that warrant physician communication and completion of prescribed/recommended actions by midnight of the next day:  No   Time spent performing this drug regimen review (minutes):  30   Tovia Kisner BS, PharmD, BCPS Clinical Pharmacist 06/15/2022 2:08 PM  Contact: [phone] after 3 PM  "Be curious, not judgmental..." -Jamal Maes

## 2022-06-16 DIAGNOSIS — I10 Essential (primary) hypertension: Secondary | ICD-10-CM

## 2022-06-16 DIAGNOSIS — R052 Subacute cough: Secondary | ICD-10-CM

## 2022-06-16 DIAGNOSIS — E1165 Type 2 diabetes mellitus with hyperglycemia: Secondary | ICD-10-CM | POA: Diagnosis not present

## 2022-06-16 DIAGNOSIS — S88112A Complete traumatic amputation at level between knee and ankle, left lower leg, initial encounter: Secondary | ICD-10-CM | POA: Diagnosis not present

## 2022-06-16 LAB — CBC
HCT: 27.6 % — ABNORMAL LOW (ref 39.0–52.0)
Hemoglobin: 8.8 g/dL — ABNORMAL LOW (ref 13.0–17.0)
MCH: 27.8 pg (ref 26.0–34.0)
MCHC: 31.9 g/dL (ref 30.0–36.0)
MCV: 87.1 fL (ref 80.0–100.0)
Platelets: 288 10*3/uL (ref 150–400)
RBC: 3.17 MIL/uL — ABNORMAL LOW (ref 4.22–5.81)
RDW: 17.6 % — ABNORMAL HIGH (ref 11.5–15.5)
WBC: 18.5 10*3/uL — ABNORMAL HIGH (ref 4.0–10.5)
nRBC: 0 % (ref 0.0–0.2)

## 2022-06-16 LAB — GLUCOSE, CAPILLARY
Glucose-Capillary: 165 mg/dL — ABNORMAL HIGH (ref 70–99)
Glucose-Capillary: 215 mg/dL — ABNORMAL HIGH (ref 70–99)
Glucose-Capillary: 148 mg/dL — ABNORMAL HIGH (ref 70–99)

## 2022-06-16 LAB — RENAL FUNCTION PANEL
Albumin: 1.8 g/dL — ABNORMAL LOW (ref 3.5–5.0)
Anion gap: 12 (ref 5–15)
BUN: 86 mg/dL — ABNORMAL HIGH (ref 6–20)
CO2: 20 mmol/L — ABNORMAL LOW (ref 22–32)
Calcium: 9 mg/dL (ref 8.9–10.3)
Chloride: 90 mmol/L — ABNORMAL LOW (ref 98–111)
Creatinine, Ser: 7.66 mg/dL — ABNORMAL HIGH (ref 0.61–1.24)
GFR, Estimated: 7 mL/min — ABNORMAL LOW (ref >60)
Glucose, Bld: 214 mg/dL — ABNORMAL HIGH (ref 70–99)
Phosphorus: 4.3 mg/dL (ref 2.5–4.6)
Potassium: 4.6 mmol/L (ref 3.5–5.1)
Sodium: 122 mmol/L — ABNORMAL LOW (ref 135–145)

## 2022-06-16 LAB — HEPATITIS B SURFACE ANTIGEN: Hepatitis B Surface Ag: NONREACTIVE

## 2022-06-16 MED ORDER — GUAIFENESIN ER 600 MG PO TB12
600.0000 mg | ORAL_TABLET | Freq: Two times a day (BID) | ORAL | Status: DC
  Administered 2022-06-16 – 2022-07-08 (×43): 600 mg via ORAL
  Filled 2022-06-16 (×43): qty 1

## 2022-06-16 MED ORDER — CHLORHEXIDINE GLUCONATE CLOTH 2 % EX PADS: 6.0000 | MEDICATED_PAD | Freq: Every day | CUTANEOUS | Status: DC

## 2022-06-16 NOTE — Progress Notes (Signed)
Inpatient Rehabilitation  Patient information reviewed and entered into eRehab system by Iviona Hole Athleen Feltner, OTR/L, Rehab Quality Coordinator.   Information including medical coding, functional ability and quality indicators will be reviewed and updated through discharge.   

## 2022-06-16 NOTE — Evaluation (Signed)
Physical Therapy Assessment and Plan  Patient Details  Name: Scott Castillo MRN: [id number] Date of Birth: [birth year]  PT Diagnosis: Abnormality of gait, Difficulty walking, Edema, Impaired sensation, Muscle weakness, and Pain in L residual limb. Rehab Potential: Good ELOS: 2-3 weeks   Today's Date: 06/16/2022 PT Individual Time: 0915-1015 PT Individual Time Calculation (min): 60 min    Hospital Problem: Principal Problem:   Unilateral complete BKA, left, initial encounter [facility])   Past Medical History:  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   H/O pancreatic cancer    HLD (hyperlipidemia)    HTN (hypertension)    Lumbar radiculopathy, chronic    hemilamectomy '94   Venous stasis dermatitis of both lower extremities    Past Surgical History:  Past Surgical History:  Procedure Laterality Date   A/V FISTULAGRAM Left 09/27/2019   Procedure: A/V FISTULAGRAM;  Surgeon: Marty Heck, MD;  Location: [place] [facility];  Service: Cardiovascular;  Laterality: Left;   AMPUTATION Left 06/11/2022   Procedure: LEFT BELOW THE KNEE AMPUTATION;  Surgeon: Newt Minion, MD;  Location: [place];  Service: Orthopedics;  Laterality: Left;   AV FISTULA PLACEMENT Left 09/17/2016   Procedure: ARTERIOVENOUS (AV) FISTULA CREATION;  Surgeon: [name], MD;  Location: [facility];  Service: Vascular;  Laterality: Left;   CATARACT EXTRACTION W/ INTRAOCULAR LENS IMPLANT Left 2015   ENDOVENOUS ABLATION SAPHENOUS VEIN W/ LASER Right 04/08/2016   EVLA R greater saphenous vein by Curt Jews MD   ENDOVENOUS ABLATION SAPHENOUS VEIN W/ LASER Left 05/13/2016   endovenous laser ablation (left greater saphenous vein) by Curt Jews MD    FOOT SURGERY     HEMILAMINOTOMY LUMBAR SPINE  '94   TONSILLECTOMY      Assessment & Plan Clinical Impression:  Patient is a 62 year old male with history of HTN, lumbar radiculopathy,  T2DM w/nephropathy, retinopathy and neuropathy, ESRD- HD MWF, diabetic foot ulcer s/p abscess drainage and on antibiotics but continued to have progressive progressed to food infection with fracture of calcaneous and concerns of osteomyelitis. He was started on IV antibiotics for foot infection.  MRI of foot showed calcaneal fracture with large abscess and osteomyelitis and Dr. Sharol Given recommended amputation as foot not felt to be salvageable. 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 Tissue cultures of left calf positive for staph aureus and Dr. Sharol Given recommended oral antibiotics X 3 weeks. IV zosyn narrowed to Augmentin today.    Blood sugars have been labile in 200-300 range and meal coverage added per CDE. Patient with hyponatremia and volume overload that is being managed with HD.  He remains over EDW and irbesartan reduced to allow for volume removal. PT/OT evaluations completed  yesterday and patient noted to be limited by weakness, limb loss and balance deficits noted on standing. CIR recommended due to functional decline.  He lives in a home with 3 STE and as independent with use of QC or walker PTA.   Patient currently requires mod/max with OOB mobility secondary to muscle weakness, decreased cardiorespiratoy endurance, impaired R LE sensation with recent L BKA, and decreased sitting balance, decreased standing balance, and decreased balance strategies.  Prior to hospitalization, patient was independent  with mobility and lived with Spouse in a Mobile home home.  Home  access is 4Stairs to enter.  Patient will benefit from skilled PT intervention to maximize safe functional mobility, minimize fall risk, and decrease caregiver burden for planned discharge home with intermittent assist.  Anticipate patient will benefit from follow up [facility] at discharge.  PT - End of Session Activity Tolerance: Tolerates 30+  min activity with multiple rests Endurance Deficit: Yes Endurance Deficit Description: generalized weakness/deconditioning PT Assessment Rehab Potential (ACUTE/IP ONLY): Good PT Barriers to Discharge: Home environment access/layout;Wound Care;Hemodialysis;Insurance for SNF coverage;Weight;Nutrition means PT Patient demonstrates impairments in the following area(s): Balance;Perception;Edema;Sensory;Endurance;Skin Integrity;Motor;Pain;Nutrition PT Transfers Functional Problem(s): Bed Mobility;Bed to Chair;Car PT Locomotion Functional Problem(s): Ambulation;Wheelchair Mobility;Stairs PT Plan PT Intensity: Minimum of 1-2 x/day ,45 to 90 minutes PT Frequency: 5 out of 7 days PT Duration Estimated Length of Stay: 2-3 weeks PT Treatment/Interventions: Ambulation/gait training;Community reintegration;DME/adaptive equipment instruction;Neuromuscular re-education;Psychosocial support;Stair training;UE/LE Strength taining/ROM;Wheelchair propulsion/positioning;Balance/vestibular training;Discharge planning;Pain management;Skin care/wound management;Therapeutic Activities;UE/LE Coordination activities;Cognitive remediation/compensation;Disease management/prevention;Functional mobility training;Patient/family education;Splinting/orthotics;Therapeutic Exercise;Visual/perceptual remediation/compensation PT Transfers Anticipated Outcome(s): Supv LRAD- Wheelchair level transfers PT Locomotion Anticipated Outcome(s): CGA/MinA for short household gait distances with LRAD PT Recommendation Recommendations for Other Services: Neuropsych consult Follow Up Recommendations: Home health PT Patient destination: Home Equipment Recommended: To be determined Equipment Details: TBD   PT Evaluation Precautions/Restrictions Precautions Precautions: Fall Precaution Comments: L BKA Other Brace: L limb protector Restrictions Weight Bearing Restrictions: Yes LLE Weight Bearing: Non weight bearing General   Vital Signs   Pain   Pain Interference Pain Interference Pain Effect on Sleep: 1. Rarely or not at all Pain Interference with Therapy Activities: 1. Rarely or not at all Pain Interference with Day-to-Day Activities: 1. Rarely or not at all Home Living/Prior [place] Available Help at Discharge: Family Type of Home: Mobile home Home Access: Stairs to enter Entrance Stairs-Number of Steps: 4 Entrance Stairs-Rails: Right;Left;Can reach both Home Layout: One level Bathroom Shower/Tub: Optometrist: No (States he has a BSC, however it is breaking down) Additional Comments: Patient reports front door and bathroom door may be too narrow for w/c  Lives With: Spouse Prior Function Level of Independence: Independent with basic ADLs;Requires assistive device for independence;Independent with homemaking with wheelchair;Independent with transfers;Independent with gait  Able to Take Stairs?: Yes Driving: Yes Vocation: On disability (Patient reports he was part-timed employed at [employer], however hasn't worked there since November) Leisure: Hobbies-yes (Comment) (Enjoys to collect football and baseball cards) Vision/Perception  Vision - History Ability to See in Adequate Light: 0 Adequate Perception Perception: Within Functional Limits Praxis Praxis: Intact  Cognition Overall Cognitive Status: No family/caregiver present to determine baseline cognitive functioning (Patient reports his wife has been telling him "for a while that he forgets stuff") Arousal/Alertness: Awake/alert Orientation Level: Oriented X4 Year: 2024 Month: February Day of Week: Correct Attention: Selective Selective Attention: Appears intact Memory: Impaired Memory Impairment: Decreased recall of new information Awareness: Appears intact Problem Solving: Appears intact Safety/Judgment: Appears intact Sensation Sensation Light Touch: Impaired [place]  sensation comments: Absent sensation at the R foot, distal to the ankle. Impaired around the residual limb but difficult to pinpoint d/t wound vac Light Touch Impaired Details: Impaired LUE Coordination Gross Motor Movements are Fluid and Coordinated: No Fine Motor Movements are Fluid and Coordinated: No Coordination and Movement Description: generalized weakness 2/2 L BKA Finger Nose Finger Test: [facility] overall Motor  Motor Motor: Other (comment) Motor - Skilled Clinical Observations: generalized weakness 2/2 L BKA, as well as coordination and strength deficits in B hands  Trunk/Postural Assessment  Cervical Assessment Cervical Assessment: Within Functional Limits Thoracic Assessment Thoracic Assessment: Within Functional Limits Lumbar Assessment Lumbar Assessment: Within Functional Limits Postural Control Postural Control: Deficits on evaluation Righting Reactions: Delayed and inadequate secondary to global deconditioning and L BKA  Balance Balance Balance Assessed: Yes Static Sitting Balance Static Sitting - Balance Support: Bilateral upper extremity supported Static Sitting - Level of Assistance: 5: Stand by assistance Dynamic Sitting Balance Dynamic Sitting - Balance Support: Bilateral upper extremity supported;During functional activity Dynamic Sitting - Level of Assistance: 4: Min Insurance risk surveyor Standing - Balance Support: Bilateral upper extremity supported Static Standing - Level of Assistance: 3: Mod assist Dynamic Standing Balance Dynamic Standing - Balance Support: During functional activity;Bilateral upper extremity supported Dynamic Standing - Level of Assistance: 1: +2 Total assist Dynamic Standing - Comments: Attempting to hop posteriorly toward the mat Extremity Assessment  RUE Assessment RUE Assessment: Exceptions to [facility] Active Range of Motion (AROM) Comments: AROM WFL General Strength Comments: 4- grossly at the shoulders and elbow.  3-/5 at the hands grossly with pt reported cervical myopathy changes. Hands appear arthritic. LUE Assessment LUE Assessment: Exceptions to [facility] General Strength Comments: 4- grossly at the shoulders and elbow. 3-/5 at the hands grossly with pt reported cervical myopathy changes. Hands appear arthritic. RLE Assessment RLE Assessment: Exceptions to [facility] General Strength Comments: Grossly 4/5 with decreased hip flexor and glute strength LLE Assessment LLE Assessment: Exceptions to [facility] General Strength Comments: L BKA- Grossly 3+/5  Care Tool Care Tool Bed Mobility Roll left and right activity   Roll left and right assist level: Supervision/Verbal cueing    Sit to lying activity   Sit to lying assist level: Supervision/Verbal cueing    Lying to sitting on side of bed activity   Lying to sitting on side of bed assist level: the ability to move from lying on the back to sitting on the side of the bed with no back support.: Supervision/Verbal cueing     Care Tool Transfers Sit to stand transfer   Sit to stand assist level: Maximal Assistance - Patient 25 - 49%    Chair/bed transfer   Chair/bed transfer assist level: Moderate Assistance - Patient 50 - 74% (Via slideboard)     Toilet transfer   Assist Level: Dependent - Patient 0% (stedy)    Car transfer   Car transfer assist level: Moderate Assistance - Patient 50 - 74% (Via slideboard)      Care Tool Locomotion Ambulation Ambulation activity did not occur: Safety/medical concerns (Unable to ambulated at this time secondary to new BKA with global deconditioning)        Walk 10 feet activity Walk 10 feet activity did not occur: Safety/medical concerns       Walk 50 feet with 2 turns activity Walk 50 feet with 2 turns activity did not occur: Safety/medical concerns      Walk 150 feet activity Walk 150 feet activity did not occur: Safety/medical concerns      Walk 10 feet on uneven surfaces activity Walk 10 feet on uneven  surfaces activity did not occur: Safety/medical concerns      Stairs Stair activity did not occur: Safety/medical concerns        Walk up/down 1 step activity Walk up/down 1 step or curb (drop down) activity did not occur: Safety/medical concerns      Walk up/down 4 steps activity Walk up/down 4 steps activity did not occur: Safety/medical concerns  Walk up/down 12 steps activity Walk up/down 12 steps activity did not occur: Safety/medical concerns      Pick up small objects from floor Pick up small object from the floor (from standing position) activity did not occur: Safety/medical concerns      Wheelchair Is the patient using a wheelchair?: Yes Type of Wheelchair: Manual   Wheelchair assist level: Supervision/Verbal cueing Max wheelchair distance: >150'  Wheel 50 feet with 2 turns activity   Assist Level: Supervision/Verbal cueing  Wheel 150 feet activity   Assist Level: Supervision/Verbal cueing    Refer to Care Plan for Long Term Goals  SHORT TERM GOAL WEEK 1 PT Short Term Goal 1 (Week 1): Patient will performed sit/stand with LRAD and ModA PT Short Term Goal 2 (Week 1): Patient will performed stand pivot transfer with LRAD and Mod/MaxA x1 PT Short Term Goal 3 (Week 1): Patient will initiate gait training  Recommendations for other services: Neuropsych  Skilled Therapeutic Intervention Mobility Bed Mobility Bed Mobility: Rolling Right;Rolling Left;Supine to Sit;Sit to Supine Rolling Right: Supervision/verbal cueing Rolling Left: Supervision/Verbal cueing Supine to Sit: Supervision/Verbal cueing Sit to Supine: Supervision/Verbal cueing Transfers Transfers: Stand to Sit;Sit to [facility];Lateral/Scoot Transfers Sit to Stand: Moderate Assistance - Patient 50-74% Stand to Sit: Moderate Assistance - Patient 50-74% Stand Pivot Transfers: 2 Helpers;Dependent - mechanical lift Lateral/Scoot Transfers: Moderate Assistance - Patient 50-74% (Via  slideboard) Transfer via Lift Equipment: Animal nutritionist: No Gait Gait: No Stairs / Additional Locomotion Stairs: No Architect: Yes Wheelchair Assistance: Chartered loss adjuster: Both upper extremities Wheelchair Parts Management: Needs assistance Distance: >150'   Skilled Intervention- Patient greeted sitting upright in wheelchair in room and agreeable to PT treatment session. Patient was able to propel manual wheelchair to/from his room and rehab gym (>150') with B UE and supv for safety- Patient required assistance for management on brakes and leg rests. Patient performed slideboard transfer to/from wheelchair and mat table- Initial transfer downhill and transfer back to wheelchair was a level surface. Patient required VC and demonstration for proper set-up, sequencing and head hips relationship. Therapist placed the board and patient demonstrated impaired dynamic sitting balance with significant L residual limb hip flexion noted as compensation. Multiple attempts required in order for patient to clear genitals for transfer with VC for lifting his bottom while scooting. Patient completed transfer with [place] for overall stability with good effort noted. Patient was able to perform x2 sit/stands from significantly elevated mat table with RW and Mod/MaxA. While standing, attempted to have patient hop posteriorly toward the mat, however patient was unable to clear R foot and required total assist for regaining his balance and safely sitting on the mat table. During the second stand, therapist lowered the mat ~1-2 inches and patient required MAX assist for standing. Patient utilized a rocking motion in order to gain momentum for standing. Patient educated on POC, CLOF, projected level of function, CIR, etc. Patient returned to his room sitting upright in wheelchair with posey belt on, call bell within reach, CSW present and all  needs met.     Discharge Criteria: Patient will be discharged from PT if patient refuses treatment 3 consecutive times without medical reason, if treatment goals not met, if there is a change in medical status, if patient makes no progress towards goals or if patient is discharged from hospital.  The above assessment, treatment plan, treatment alternatives and goals were discussed and mutually agreed upon: by patient  Dominica  Sherrye Puga 06/16/2022, 10:08 AM

## 2022-06-16 NOTE — Plan of Care (Signed)
Problem: Sit to Stand Goal: LTG:  Patient will perform sit to stand with assistance level (PT) Description: LTG:  Patient will perform sit to stand with assistance level (PT) Flowsheets (Taken 06/16/2022 1034) LTG: PT will perform sit to stand in preparation for functional mobility with assistance level: Contact Guard/Touching assist   Problem: RH Bed Mobility Goal: LTG Patient will perform bed mobility with assist (PT) Description: LTG: Patient will perform bed mobility with assistance, with/without cues (PT). Flowsheets (Taken 06/16/2022 1034) LTG: Pt will perform bed mobility with assistance level of: Independent with assistive device    Problem: RH Bed to Chair Transfers Goal: LTG Patient will perform bed/chair transfers w/assist (PT) Description: LTG: Patient will perform bed to chair transfers with assistance (PT). Flowsheets (Taken 06/16/2022 1034) LTG: Pt will perform Bed to Chair Transfers with assistance level: Contact Guard/Touching assist   Problem: RH Car Transfers Goal: LTG Patient will perform car transfers with assist (PT) Description: LTG: Patient will perform car transfers with assistance (PT). Flowsheets (Taken 06/16/2022 1034) LTG: Pt will perform car transfers with assist:: Contact Guard/Touching assist   Problem: RH Ambulation Goal: LTG Patient will ambulate in home environment (PT) Description: LTG: Patient will ambulate in home environment, # of feet with assistance (PT). Flowsheets (Taken 06/16/2022 1034) LTG: Pt will ambulate in home environ  assist needed:: Contact Guard/Touching assist LTG: Ambulation distance in home environment: 10'   Problem: RH Wheelchair Mobility Goal: LTG Patient will propel w/c in community environment (PT) Description: LTG: Patient will propel wheelchair in community environment, # of feet with assist (PT) Flowsheets (Taken 06/16/2022 1034) LTG: Pt will propel w/c in community environ  assist needed:: Independent with assistive  device Distance: wheelchair distance in controlled environment: 150

## 2022-06-16 NOTE — Evaluation (Signed)
Occupational Therapy Assessment and Plan  Patient Details  Name: Scott Castillo MRN: [id number] Date of Birth: 04/19/1961  OT Diagnosis: acute pain, muscle weakness (generalized), and L BKA Rehab Potential: Rehab Potential (ACUTE ONLY): Good ELOS: 3 weeks   Today's Date: 06/16/2022 OT Individual Time: [id number] OT Individual Time Calculation (min): 75 min     Hospital Problem: Principal Problem:   Unilateral complete BKA, left, initial encounter ([place])   Past Medical History:  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   H/O pancreatic cancer    HLD (hyperlipidemia)    HTN (hypertension)    Lumbar radiculopathy, chronic    hemilamectomy '94   Venous stasis dermatitis of both lower extremities    Past Surgical History:  Past Surgical History:  Procedure Laterality Date   A/V FISTULAGRAM Left 09/27/2019   Procedure: A/V FISTULAGRAM;  Surgeon: Marty Heck, MD;  Location: [place] [facility];  Service: Cardiovascular;  Laterality: Left;   AMPUTATION Left 06/11/2022   Procedure: LEFT BELOW THE KNEE AMPUTATION;  Surgeon: Newt Minion, MD;  Location: [place];  Service: Orthopedics;  Laterality: Left;   AV FISTULA PLACEMENT Left 09/17/2016   Procedure: ARTERIOVENOUS (AV) FISTULA CREATION;  Surgeon: [name], MD;  Location: [facility];  Service: Vascular;  Laterality: Left;   CATARACT EXTRACTION W/ INTRAOCULAR LENS IMPLANT Left 2015   ENDOVENOUS ABLATION SAPHENOUS VEIN W/ LASER Right 04/08/2016   EVLA R greater saphenous vein by Curt Jews MD   ENDOVENOUS ABLATION SAPHENOUS VEIN W/ LASER Left 05/13/2016   endovenous laser ablation (left greater saphenous vein) by Curt Jews MD    FOOT SURGERY     HEMILAMINOTOMY LUMBAR SPINE  '94   TONSILLECTOMY      Assessment & Plan Clinical Impression: Trenell E. Height is a 62 year  old male with history of HTN, lumbar radiculopathy,  T2DM w/nephropathy, retinopathy and neuropathy, ESRD- HD MWF, diabetic foot ulcer s/p abscess drainage and on antibiotics but continued to have progressive progressed to food infection with fracture of calcaneous and concerns of osteomyelitis. He was started on IV antibiotics for foot infection.  MRI of foot showed calcaneal fracture with large abscess and osteomyelitis and Dr. Sharol Given recommended amputation as foot not felt to be salvageable. 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 Tissue cultures of left calf positive for staph aureus and Dr. Sharol Given recommended oral antibiotics X 3 weeks. IV zosyn narrowed to Augmentin today.    Blood sugars have been labile in 200-300 range and meal coverage added per CDE. Patient with hyponatremia and volume overload that is being managed with HD.  He remains over EDW and irbesartan reduced to allow for volume removal. PT/OT evaluations completed  yesterday and patient noted to be limited by weakness, limb loss and balance deficits noted on standing. CIR recommended due to functional decline.  He lives in a home with 3 STE and as independent with use of QC or walker PTA. Patient transferred to CIR on 06/15/2022 .    Patient currently requires mod with basic self-care skills secondary to muscle weakness, decreased cardiorespiratoy endurance, and decreased sitting balance, decreased standing balance, decreased postural control, and decreased balance strategies.  Prior to hospitalization, patient could complete ADLs with modified independent .  Patient will benefit from skilled  intervention to increase independence with basic self-care skills prior to discharge home with care partner.  Anticipate patient will require intermittent supervision and follow up home health.  OT - End of Session Activity Tolerance: Tolerates < 10 min activity, no significant change in vital signs Endurance Deficit:  Yes Endurance Deficit Description: generalized weakness OT Assessment Rehab Potential (ACUTE ONLY): Good OT Patient demonstrates impairments in the following area(s): Balance;Endurance;Motor;Pain;Sensory;Skin Integrity;Edema OT Basic ADL's Functional Problem(s): Dressing;Toileting;Bathing OT Transfers Functional Problem(s): Toilet OT Additional Impairment(s): None OT Plan OT Intensity: Minimum of 1-2 x/day, 45 to 90 minutes OT Frequency: 5 out of 7 days OT Duration/Estimated Length of Stay: 3 weeks OT Treatment/Interventions: Balance/vestibular training;DME/adaptive equipment instruction;Patient/family education;Therapeutic Activities;Psychosocial support;Therapeutic Exercise;Wheelchair propulsion/positioning;Functional mobility training;UE/LE Strength taining/ROM;Self Care/advanced ADL retraining;Discharge planning;Skin care/wound managment;UE/LE Coordination activities;Pain management;Splinting/orthotics OT Self Feeding Anticipated Outcome(s): (S) OT Basic Self-Care Anticipated Outcome(s): (S) OT Toileting Anticipated Outcome(s): CGA OT Bathroom Transfers Anticipated Outcome(s): CGA OT Recommendation Patient destination: Home Follow Up Recommendations: Home health OT Equipment Recommended: To be determined   OT Evaluation Precautions/Restrictions  Precautions Precautions: Fall Precaution Comments: L BKA Other Brace: L limb protector Restrictions Weight Bearing Restrictions: Yes LLE Weight Bearing: Non weight bearing General Chart Reviewed: Yes Family/Caregiver Present: No   Home Living/Prior Functioning Home Living Family/patient expects to be discharged to:: Private residence Living Arrangements: Spouse/significant other Available Help at Discharge: Family Type of Home: Mobile home Home Access: Stairs to enter Technical brewer of Steps: 4 Entrance Stairs-Rails: Right, Left, Can reach both Home Layout: One level Bathroom Shower/Tub: Print production planner: Handicapped height Bathroom Accessibility: No Additional Comments: Wife thinks front door and bathroom door may be too narrow for w/c  Lives With: Spouse IADL History Homemaking Responsibilities: Yes Meal Prep Responsibility: Secondary Laundry Responsibility: Secondary Cleaning Responsibility: Secondary Bill Paying/Finance Responsibility: Secondary Shopping Responsibility: Secondary Current License: No Occupation: On disability Prior Function Level of Independence: Independent with basic ADLs, Requires assistive device for independence, Independent with homemaking with wheelchair (used quad cane) Driving: No Vocation: On disability Vision Baseline Vision/History: 1 Wears glasses (reports L eye is blurry at baseline) Ability to See in Adequate Light: 0 Adequate Patient Visual Report: No change from baseline Vision Assessment?: No apparent visual deficits Perception  Perception: Within Functional Limits Praxis Praxis: Intact Cognition Cognition Overall Cognitive Status: No family/caregiver present to determine baseline cognitive functioning Arousal/Alertness: Awake/alert Orientation Level: Person;Place;Situation Person: Oriented Place: Oriented Situation: Oriented Memory: Impaired Memory Impairment: Decreased recall of new information Attention: Selective Selective Attention: Appears intact Awareness: Appears intact Problem Solving: Appears intact Safety/Judgment: Appears intact Brief Interview for Mental Status (BIMS) Repetition of Three Words (First Attempt): 3 Temporal Orientation: Year: Correct Temporal Orientation: Month: Accurate within 5 days Temporal Orientation: Day: Incorrect Recall: "Sock": No, could not recall Recall: "Blue": Yes, no cue required Recall: "Bed": No, could not recall BIMS Summary Score: 10 Sensation Sensation Light Touch: Impaired Detail Central sensation comments: Absent sensation at the R foot, distal to the ankle. Impaired  around the residual limb but difficult to pinpoint d/t wound vac Light Touch Impaired Details: Impaired LUE Coordination Gross Motor Movements are Fluid and Coordinated: No Fine Motor Movements are Fluid and Coordinated: No Coordination and Movement Description: generalized weakness 2/2 L BKA Finger Nose Finger Test: [facility] overall Motor  Motor Motor: Other (comment) Motor - Skilled Clinical Observations: generalized weakness 2/2 L BKA, as well as coordination and strength deficits in B hands  Trunk/Postural Assessment  Cervical Assessment Cervical Assessment: Within Functional Limits Thoracic Assessment Thoracic  Assessment: Within Functional Limits Lumbar Assessment Lumbar Assessment: Within Functional Limits Postural Control Postural Control: Deficits on evaluation (impaired)  Balance Balance Balance Assessed: Yes Static Sitting Balance Static Sitting - Balance Support: Bilateral upper extremity supported Static Sitting - Level of Assistance: 5: Stand by assistance Dynamic Sitting Balance Dynamic Sitting - Balance Support: Bilateral upper extremity supported Dynamic Sitting - Level of Assistance: 4: Min assist Static Standing Balance Static Standing - Balance Support: Bilateral upper extremity supported Static Standing - Level of Assistance: 3: Mod assist (in the stedy) Extremity/Trunk Assessment RUE Assessment RUE Assessment: Exceptions to [facility] Active Range of Motion (AROM) Comments: AROM WFL General Strength Comments: 4- grossly at the shoulders and elbow. 3-/5 at the hands grossly with pt reported cervical myopathy changes. Hands appear arthritic. LUE Assessment LUE Assessment: Exceptions to [facility] General Strength Comments: 4- grossly at the shoulders and elbow. 3-/5 at the hands grossly with pt reported cervical myopathy changes. Hands appear arthritic.  Care Tool Care Tool Self Care Eating   Eating Assist Level: Supervision/Verbal cueing    Oral Care    Oral Care  Assist Level: Supervision/Verbal cueing    Bathing   Body parts bathed by patient: Right arm;Left arm;Abdomen;Chest;Front perineal area;Right upper leg;Left upper leg;Face Body parts bathed by helper: Buttocks;Right lower leg Body parts n/a: Left lower leg (BKA) Assist Level: Moderate Assistance - Patient 50 - 74%    Upper Body Dressing(including orthotics)   What is the patient wearing?: Pull over shirt   Assist Level: Supervision/Verbal cueing    Lower Body Dressing (excluding footwear)   What is the patient wearing?: Pants;Incontinence brief Assist for lower body dressing: Maximal Assistance - Patient 25 - 49%    Putting on/Taking off footwear   What is the patient wearing?: Non-skid slipper socks;Socks;Orthosis Assist for footwear: Maximal Assistance - Patient 25 - 49%       Care Tool Toileting Toileting activity   Assist for toileting: Maximal Assistance - Patient 25 - 49%     Care Tool Bed Mobility Roll left and right activity   Roll left and right assist level: Supervision/Verbal cueing    Sit to lying activity   Sit to lying assist level: Supervision/Verbal cueing    Lying to sitting on side of bed activity   Lying to sitting on side of bed assist level: the ability to move from lying on the back to sitting on the side of the bed with no back support.: Supervision/Verbal cueing     Care Tool Transfers Sit to stand transfer   Sit to stand assist level: Maximal Assistance - Patient 25 - 49%    Chair/bed transfer   Chair/bed transfer assist level: Dependent - mechanical lift (stedy)     Toilet transfer   Assist Level: Dependent - Patient 0% (stedy)     Care Tool Cognition  Expression of Ideas and Wants Expression of Ideas and Wants: 4. Without difficulty (complex and basic) - expresses complex messages without difficulty and with speech that is clear and easy to understand  Understanding Verbal and Non-Verbal Content Understanding Verbal and Non-Verbal  Content: 4. Understands (complex and basic) - clear comprehension without cues or repetitions   Memory/Recall Ability Memory/Recall Ability : Current season;Location of own room   Refer to Care Plan for Long Term Goals  SHORT TERM GOAL WEEK 1 OT Short Term Goal 1 (Week 1): Pt will stand from EOB with mod A OT Short Term Goal 2 (Week 1): Pt will don pants sit <>  stand with mod A using LRAD OT Short Term Goal 3 (Week 1): Pt will complete stand pivot transfer with mod A  Recommendations for other services: None    Skilled Therapeutic Intervention ADL ADL Eating: Set up Where Assessed-Eating: Bed level Grooming: Supervision/safety Where Assessed-Grooming: Wheelchair Upper Body Bathing: Supervision/safety Where Assessed-Upper Body Bathing: Edge of bed Lower Body Bathing: Moderate assistance Where Assessed-Lower Body Bathing: Bed level;Edge of bed Upper Body Dressing: Supervision/safety Where Assessed-Upper Body Dressing: Edge of bed Lower Body Dressing: Moderate assistance Where Assessed-Lower Body Dressing: Edge of bed Where Assessed-Toileting: Bedside Commode Toilet Transfer: Moderate assistance Toilet Transfer Method: Other (comment) (stedy) Mobility  Bed Mobility Bed Mobility: Rolling Right;Rolling Left;Supine to Sit;Sit to Supine Rolling Right: Supervision/verbal cueing Rolling Left: Supervision/Verbal cueing Supine to Sit: Supervision/Verbal cueing Sit to Supine: Supervision/Verbal cueing   Skilled OT evaluation completed with the creation of pt centered OT POC. Pt educated on condition, ELOS, rehab expectations, and fall risk reduction strategies throughout session. Edu on limb loss provided, including demonstration of desensitization techniques. Pt completed stand in the stedy with min A. Standing at the sink with max A. Did not feel safe doing stand pivot without +2 assist. Pt completed ADLs as described above. Pt was left sitting up in the w/c with all needs met, chair  alarm set, and call bell within reach. Wound vac plugged in.     Discharge Criteria: Patient will be discharged from OT if patient refuses treatment 3 consecutive times without medical reason, if treatment goals not met, if there is a change in medical status, if patient makes no progress towards goals or if patient is discharged from hospital.  The above assessment, treatment plan, treatment alternatives and goals were discussed and mutually agreed upon: by patient  Curtis Sites 06/16/2022, 8:32 AM

## 2022-06-16 NOTE — Progress Notes (Signed)
Occupational Therapy Session Note  Patient Details  Name: Scott Castillo MRN: [id number] Date of Birth: 01-Dec-1960  Today's Date: 06/16/2022 OT Individual Time: 1105-1200 OT Individual Time Calculation (min): 55 min    Short Term Goals: Week 1:  OT Short Term Goal 1 (Week 1): Pt will stand from EOB with mod A OT Short Term Goal 2 (Week 1): Pt will don pants sit <> stand with mod A using LRAD OT Short Term Goal 3 (Week 1): Pt will complete stand pivot transfer with mod A  Skilled Therapeutic Interventions/Progress Updates:  Pt received sitting in the w/c with no c/o pain at rest and agreeable to OT session. He completed w/c propulsion to the therapy gym with rest breaks every 15 ft or so, requiring increased time for the 120 ft total he completed. Min cueing for technique. He completed slideboard transfer w/c <> mat. He was able to complete this with CGA! Heavy support was required on the w/c to stabilize. He was then taken to the parallel bars. He worked on sit > stands with BUE support on the bars. Mirror used for visual feedback for LLE positioning. Cueing required for positioning during sit <> stands. He had difficulty remember to raise up LLE to sit and often was very close to bearing weight through the distal end of the limb. Limb guard on throughout session. He completed several more standing activities with focus on static standing balance with alternating removal of a single UE at a time. Attempted to hop forward and pt was able to lift heel only with large LOB posteriorly. Mod A provided throughout activities. Pt was left sitting up in the w/c with all needs met, chair alarm set, and call bell within reach. Wound vac plugged in.   Therapy Documentation Precautions:  Precautions Precautions: Fall Precaution Comments: L BKA Other Brace: L limb protector Restrictions Weight Bearing Restrictions: Yes LLE Weight Bearing: Non weight bearing   Therapy/Group: Individual  Therapy  Curtis Sites 06/16/2022, 9:57 AM

## 2022-06-16 NOTE — Progress Notes (Signed)
Inpatient Rehabilitation Care Coordinator Assessment and Plan Patient Details  Name: Scott Castillo MRN: [id number] Date of Birth: Jan 07, 1961  Today's Date: 06/16/2022  Hospital Problems: Principal Problem:   Unilateral complete BKA, left, initial encounter [facility])  Past Medical History:  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   H/O pancreatic cancer    HLD (hyperlipidemia)    HTN (hypertension)    Lumbar radiculopathy, chronic    hemilamectomy '94   Venous stasis dermatitis of both lower extremities    Past Surgical History:  Past Surgical History:  Procedure Laterality Date   A/V FISTULAGRAM Left 09/27/2019   Procedure: A/V FISTULAGRAM;  Surgeon: Marty Heck, MD;  Location: [place] [facility];  Service: Cardiovascular;  Laterality: Left;   AMPUTATION Left 06/11/2022   Procedure: LEFT BELOW THE KNEE AMPUTATION;  Surgeon: Newt Minion, MD;  Location: [place];  Service: Orthopedics;  Laterality: Left;   AV FISTULA PLACEMENT Left 09/17/2016   Procedure: ARTERIOVENOUS (AV) FISTULA CREATION;  Surgeon: [name], MD;  Location: [facility];  Service: Vascular;  Laterality: Left;   CATARACT EXTRACTION W/ INTRAOCULAR LENS IMPLANT Left 2015   ENDOVENOUS ABLATION SAPHENOUS VEIN W/ LASER Right 04/08/2016   EVLA R greater saphenous vein by Curt Jews MD   ENDOVENOUS ABLATION SAPHENOUS VEIN W/ LASER Left 05/13/2016   endovenous laser ablation (left greater saphenous vein) by Curt Jews MD    FOOT SURGERY     HEMILAMINOTOMY LUMBAR SPINE  '94   TONSILLECTOMY     Social History:  reports that he has never smoked. He has never used smokeless tobacco. He reports that he does not drink alcohol and does not use drugs.  Family / Support Systems Marital Status: Married Patient Roles: Spouse, Parent, Other (Comment) (retiree snce  02/2022) Spouse/Significant Other: Helene Kelp [phone] Children: Christina-daughter [phone] Other Supports: Ann-mom [phone]  [place]-dad [phone] Anticipated Caregiver: Wife Ability/Limitations of Caregiver: Wife works FT 6:30-3:00 pm but plans to take one month off to assist with transition and pt's care Caregiver Availability: Other (Comment) (24/7 for one month then be alone during the day) Family Dynamics: Close knit with family and parents, along with freinds. All have been assisting him to OP-HD and making sure his needs are met  Social History Preferred language: English Religion: Baptist Cultural Background: No issues Education: [place] - How often do you need to have someone help you when you read instructions, pamphlets, or other written material from your doctor or pharmacy?: Rarely Writes: Yes Employment Status: Disabled Public relations account executive Issues: No issues Guardian/Conservator: None-according to MD pt is capable of making his own decisions while here will include wife per pt's request   Abuse/Neglect Abuse/Neglect Assessment Can Be Completed: Yes Physical Abuse: Denies Verbal Abuse: Denies Sexual Abuse: Denies Exploitation of patient/patient's resources: Denies Self-Neglect: Denies  Patient response to: Social Isolation - How often do you feel lonely or isolated from those around you?: Never  Emotional Status Pt's affect, behavior and adjustment status: Pt is motivated to do well and hopefully ambulate since he feels a wheelchair will not fit in their mobile home. He was using a quad cane prior to admission but still had his leg. He has always been able to take care of himself and hopes to be able to do so again Recent  Psychosocial Issues: other medical issues-ESRD-OP-HD Psychiatric History: no history due to all that has happened to him he would benefit from seeing neuro-psych while here. Substance Abuse History: No issues  Patient / Family  Perceptions, Expectations & Goals Pt/Family understanding of illness & functional limitations: Pt is able to explain his amputation and issues regarding this. He was hopeful he could heal but once MD explained could not time to move forward. He does talk with the MD and feels has a good understanding of his plan moving forward. Premorbid pt/family roles/activities: husband, father, retiree, son, dialysis pt, neighbor, etc Anticipated changes in roles/activities/participation: resume Pt/family expectations/goals: Pt states: " I hope to be able to hop inside my home and be independent."  [employer]: Other (Comment) Gaylene Brooks M, W, F 9:45 am chair time) Premorbid Home Care/DME Agencies: Other (Comment) (rw, quad cane, bsc,rollator) Transportation available at discharge: family and wife Is the patient able to respond to transportation needs?: Yes In the past 12 months, has lack of transportation kept you from medical appointments or from getting medications?: No In the past 12 months, has lack of transportation kept you from meetings, work, or from getting things needed for daily living?: No Resource referrals recommended: Neuropsychology  Discharge Planning Living Arrangements: Spouse/significant other Support Systems: Spouse/significant other, Children, Other relatives, Friends/neighbors Type of Residence: Private residence Insurance Resources: [employer], Multimedia programmer (specify) Nurse, mental health) Financial Resources: SSD, Family Support Financial Screen Referred: No Living Expenses: Own Money Management: Patient, Spouse Does the patient have any problems obtaining your medications?: No Home Management: wife Patient/Family Preliminary Plans: Return home with wife who is planning on taking one month off to be with him for the transition home. Hopefully after this he will be mod/i level. His parents and daughter are involved and will assist when can. Care Coordinator  Barriers to Discharge: Insurance for SNF coverage, Hemodialysis, Home environment access/layout Care Coordinator Anticipated Follow Up Needs: HH/OP  Clinical Impression Pleasant gentleman who is motivated to do well and recover from his amputation. His wife, daughter and parents are involved and will assist. Pt feels mobile home will not accommodate a wheelchair so hopes to be ambulatory at discharge. Will await team's evaluations.  Elease Hashimoto 06/16/2022, 10:38 AM

## 2022-06-16 NOTE — Progress Notes (Signed)
   06/16/22 1858  Vitals  Temp 98 F (36.7 C)  Pulse Rate 92  Resp (!) 23  BP (!) 154/113  SpO2 95 %  O2 Device Room Air  Type of Weight Post-Dialysis  Oxygen Therapy  Patient Activity (if Appropriate) In bed  Pulse Oximetry Type Continuous  Oximetry Probe Site Changed No  Post Treatment  Dialyzer Clearance Lightly streaked  Duration of HD Treatment -hour(s) 4 hour(s)  Hemodialysis Intake (mL) 0 mL  Liters Processed 92  Fluid Removed (mL) 3800 mL  Tolerated HD Treatment Yes  Post-Hemodialysis Comments no complications  AVG/AVF Arterial Site Held (minutes) 10 minutes  AVG/AVF Venous Site Held (minutes) 10 minutes   Received patient in bed to unit.  Alert and oriented.  Informed consent signed and in chart.   [place] duration:4.0  Patient tolerated well.  Transported back to the room  Alert, without acute distress.  Hand-off given to patient's nurse.   Access used: LUAF Access issues: no complicationsnurse aware  Total UF removed: 3800 Medication(s) given: none [facility]

## 2022-06-16 NOTE — Patient Care Conference (Signed)
Inpatient RehabilitationTeam Conference and Plan of Care Update Date: 06/16/2022   Time: 12:14 PM    Patient Name: Scott Castillo      Medical Record Number: [id number]  Date of Birth: December 13, 1960 Sex: Male         Room/Bed: 4M11C/4M11C-01 Payor Info: Payor: MEDICARE / Plan: MEDICARE PART A AND B / Product Type: *No Product type* /    Admit Date/Time:  06/15/2022  5:02 PM  Primary Diagnosis:  Unilateral complete BKA, left, initial encounter [facility])  Hospital Problems: Principal Problem:   Unilateral complete BKA, left, initial encounter [facility])    Expected Discharge Date: Expected Discharge Date:  (Evals pending)  Team Members Present: Physician leading conference: Dr. Jennye Boroughs Social Worker Present: Ovidio Kin, LCSW Nurse Present: Dorien Chihuahua, RN PT Present: Becky Sax, PT OT Present: Laverle Hobby, Ludwig Lean, Calton Golds     Current Status/Progress Goal Weekly Team Focus  Bowel/Bladder   Patient had an incontinent episode with coughing.   patient to remain continent   Help patient with voiding need.    Swallow/Nutrition/ Hydration               ADL's   (S) UB ADLs, max A LB ADLs, min A slideboard, max A stand, unable to pivot. Inaccessible home environment.   CGA overall   sit <> stand, ADLs, transfers, RLE strengthening, LLE positioning, limb loss education    Mobility   Supv bed mobility; transfers ModA via slideboard; Sit/stands with RW Mod/MaxA from elevated surface; Supv wheelchair mobility   TBD  functional mobility/transfers, generalized strengthening and endurance, D/C planning, equipment management.    Communication                Safety/Cognition/ Behavioral Observations               Pain   Patient with left BKA incision. Denied pain this shift. PRN pain medication avalable.   Pain control.   Pain assessment. Pain treatment based on reported pain level.    Skin   Patient has erythema to buttocks and scrotum.   Prevent  progression to pressure injury.  Encourage turning and repositioning. use appropriate skin care product.      Discharge Planning:  new evaluation-home with wife who works during the day. Step-Dad and friends transport to OP-HD. See if can get mod/i with transfers to be safe home alone   Team Discussion: Patient with cough/dry mouth, ESRD/oliguric on HD. Currently with wound vac and stasis dermatitis on opposite leg and coccyx wound. Pain controlled. New to insulin; medications adjusted.   Patient on target to meet rehab goals: yes, currently needs supervision for upper body care and max assist for lower body care with CGA for slide-board transfers. Needs max assist to stand. Goals for discharge set for CGA overall.  *See Care Plan and progress notes for long and short-term goals.   Revisions to Treatment Plan:  N/A   Teaching Needs: Safety, insulin administration/medications, transfers, skin care, wound care, limb protector/restrictions, etc.  Current Barriers to Discharge: Decreased caregiver support and Home enviroment access/layout  Possible Resolutions to Barriers: Ramp for entry to home DME; Geneticist, molecular Current Status: L BKA, HTN, cough, ESRD, DM2  Barriers to Discharge: Uncontrolled Diabetes;Weight bearing restrictions;Complicated Wound  Barriers to Discharge Comments: L BKA, HTN, cough, ESRD, DM2 Possible Resolutions to [employer] Focus: Mucinex, Iicentive spirometer, Adjust insulin, monitor wounds   Continued Need for Acute Rehabilitation Level of Care: The  patient requires daily medical management by a physician with specialized training in physical medicine and rehabilitation for the following reasons: Direction of a multidisciplinary physical rehabilitation program to maximize functional independence : Yes Medical management of patient stability for increased activity during participation in an intensive rehabilitation regime.:  Yes Analysis of laboratory values and/or radiology reports with any subsequent need for medication adjustment and/or medical intervention. : Yes   I attest that I was present, lead the team conference, and concur with the assessment and plan of the team.   Dorien Chihuahua B 06/16/2022, 1:54 PM

## 2022-06-16 NOTE — Progress Notes (Signed)
[facility] Progress Note   Subjective: in CIR now. Patient seen and examined bedside. No complaints   Objective Vitals:   06/15/22 1800 06/16/22 0000 06/16/22 0500  BP:   128/82  Pulse:   87  Resp:   18  Temp:   97.7 F (36.5 C)  TempSrc:   Oral  SpO2:  98% 95%  Weight: 113.2 kg  111.8 kg  Height: 6' (1.829 m)     Physical Exam General: sitting up in wheelchair Heart: S1,S2 No M/R/G Lungs: normal WOB Abdomen: NT, NABS Extremities: L BKA stump protector in place.  Dialysis Access: L AVF +T/B  Additional Objective Labs: Basic Metabolic Panel: Recent Labs  Lab 06/13/22 0951 06/14/22 0226 06/15/22 0424  NA 122* 124* 127*  K 4.2 3.9 3.9  CL 86* 89* 90*  CO2 22 23 21*  GLUCOSE 290* 225* 198*  BUN 68* 53* 59*  CREATININE 6.48* 5.49* 6.28*  CALCIUM 8.4* 8.4* 9.0  PHOS 5.3* 4.3 5.0*   Liver Function Tests: Recent Labs  Lab 06/10/22 0817 06/11/22 0358 06/12/22 0050 06/13/22 0951 06/14/22 0226 06/15/22 0424  AST 40 50* 37  --   --   --   ALT '24 31 24  '$ --   --   --   ALKPHOS 98 172* 118  --   --   --   BILITOT 0.7 0.6 0.6  --   --   --   PROT 6.4* 6.9 5.9*  --   --   --   ALBUMIN 1.7* 1.9* 1.6* 1.7* 1.6* 1.6*   No results for input(s): "LIPASE", "AMYLASE" in the last 168 hours. CBC: Recent Labs  Lab 06/10/22 0817 06/11/22 0358 06/12/22 0050 06/13/22 0951 06/14/22 0226 06/15/22 0424  WBC 13.9* 16.9* 16.4* 22.2* 20.4* 16.7*  NEUTROABS 12.3* 15.0* 14.3*  --   --   --   HGB 7.6* 8.9* 8.0* 9.2* 8.5* 8.5*  HCT 24.2* 27.6* 26.0* 30.0* 26.4* 26.8*  MCV 87.7 86.3 88.4 88.8 87.1 87.6  PLT 271 285 273 311 254 252   Blood Culture    Component Value Date/Time   SDES TISSUE 06/11/2022 1449   SPECREQUEST LEFT BKA 06/11/2022 1449   CULT  06/11/2022 1449    RARE STAPHYLOCOCCUS AUREUS NO ANAEROBES ISOLATED; CULTURE IN PROGRESS FOR 5 DAYS    REPTSTATUS PENDING 06/11/2022 1449    Cardiac Enzymes: Recent Labs  Lab 06/10/22 0817  CKTOTAL  128   CBG: Recent Labs  Lab 06/15/22 0704 06/15/22 1139 06/15/22 1755 06/15/22 2059 06/16/22 0606  GLUCAP 193* 288* 271* 284* 165*   Iron Studies: No results for input(s): "IRON", "TIBC", "TRANSFERRIN", "FERRITIN" in the last 72 hours. '@lablastinr3'$ @ Studies/Results: DG Chest 2 View  Result Date: 06/15/2022 CLINICAL DATA:  Cough EXAM: CHEST - 2 VIEW COMPARISON:  Chest x-ray 03/16/2022 FINDINGS: There are patchy right basilar opacities. There is no pleural effusion or pneumothorax. Lung volumes are low. The heart is mildly enlarged. No evidence for pneumothorax or acute fracture. IMPRESSION: 1. Patchy right basilar opacities may represent atelectasis or pneumonia. 2. Low lung volumes. Electronically Signed   By: Ronney Asters M.D.   On: 06/15/2022 20:14   Medications:  anticoagulant sodium citrate      amoxicillin-clavulanate  1 tablet Oral q1800   vitamin C  1,000 mg Oral Daily   atorvastatin  40 mg Oral Daily   calcitRIOL  1.25 mcg Oral Q M,W,F-HD   carvedilol  25 mg Oral BID WC   Chlorhexidine  Gluconate Cloth  6 each Topical Q0600   Chlorhexidine Gluconate Cloth  6 each Topical Q0600   [START ON 06/21/2022] darbepoetin (ARANESP) injection - DIALYSIS  200 mcg Subcutaneous Q Mon-1800   diclofenac Sodium  2 g Topical TID AC   Gerhardt's butt cream   Topical BID   guaiFENesin  600 mg Oral BID   heparin  5,000 Units Subcutaneous Q8H   insulin aspart  0-5 Units Subcutaneous QHS   insulin aspart  0-9 Units Subcutaneous TID with meals   insulin aspart  2 Units Subcutaneous TID WC   insulin glargine-yfgn  8 Units Subcutaneous Daily   irbesartan  75 mg Oral Daily   lactobacillus  1 g Oral TID WC   multivitamin  1 tablet Oral QHS   nutrition supplement (JUVEN)  1 packet Oral BID BM   pantoprazole  40 mg Oral Daily   sucroferric oxyhydroxide  1,000 mg Oral TID WC   [START ON 06/17/2022] torsemide  100 mg Oral Once per day on Sun Tue Thu Sat   zinc sulfate  220 mg Oral Daily      Dialysis Orders: [facility] MWF 4h 71mn  400/800   108.5kg   2/2 bath  LFA AVF   - No Heparin  - rocaltrol 1.25 mcg po tiw - mircera 225 mcg IV  q 2wks, last 2/12, due 2/26 - last Hb 8.6 on 2/14   Assessment/Plan: 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 IV antibiotics per primary.  Hyponatremia - worsening in the setting of hypervolemia. managing with HD/UF ESRD - on HD MWF. Next HD 06/16/2022 HTN/ volume - Remains very much above OP EDW with hyponatremia. Decreased irbesartan to 75 mg PO daily  to allow for more volume to be removed. Glycemic control plays role in high gains. UF as tolerated and optimize volume with HD.  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 Last ESA 2/26.  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 Cont po vdra and velphoro as binder.  DM2 per pmd. Chronic hyperglycemia, poorly controlled DMT2.  Nutrition - hypoalbuminemia- Continue protein supplement. Fluid restriction discussed with patient.     VGean Quint MD [facility]

## 2022-06-16 NOTE — Progress Notes (Addendum)
PROGRESS NOTE   Subjective/Complaints: Continues cough- has been present for about 3 weeks. It improves with oral hydration. He feels like he has chest congestion.   Residual limb pain controlled on medications.    Review of Systems  Constitutional:  Negative for chills and fever.  HENT:  Positive for congestion.   Respiratory:  Positive for cough.   Cardiovascular:  Negative for chest pain.  Gastrointestinal:  Negative for abdominal pain, nausea and vomiting.  Musculoskeletal:  Positive for joint pain.  Neurological:  Positive for weakness.      .  Objective:   DG Chest 2 View  Result Date: 06/15/2022 CLINICAL DATA:  Cough EXAM: CHEST - 2 VIEW COMPARISON:  Chest x-ray 03/16/2022 FINDINGS: There are patchy right basilar opacities. There is no pleural effusion or pneumothorax. Lung volumes are low. The heart is mildly enlarged. No evidence for pneumothorax or acute fracture. IMPRESSION: 1. Patchy right basilar opacities may represent atelectasis or pneumonia. 2. Low lung volumes. Electronically Signed   By: Ronney Asters M.D.   On: 06/15/2022 20:14   Recent Labs    06/14/22 0226 06/15/22 0424  WBC 20.4* 16.7*  HGB 8.5* 8.5*  HCT 26.4* 26.8*  PLT 254 252   Recent Labs    06/14/22 0226 06/15/22 0424  NA 124* 127*  K 3.9 3.9  CL 89* 90*  CO2 23 21*  GLUCOSE 225* 198*  BUN 53* 59*  CREATININE 5.49* 6.28*  CALCIUM 8.4* 9.0    Intake/Output Summary (Last 24 hours) at 06/16/2022 1007 Last data filed at 06/16/2022 0757 Gross per 24 hour  Intake 597 ml  Output 0 ml  Net 597 ml        Physical Exam: Vital Signs Blood pressure 128/82, pulse 87, temperature 97.7 F (36.5 C), temperature source Oral, resp. rate 18, height 6' (1.829 m), weight 111.8 kg, SpO2 95 %.   General: No apparent distress HEENT: Head is normocephalic, atraumatic, PERRLA, EOMI, sclera anicteric, oral mucosa Dry Neck: Supple without JVD or  lymphadenopathy Heart: Reg rate and rhythm. No murmurs rubs or gallops Chest: CTA bilaterally without wheezes, rales, or rhonchi. Intermittent cough noted Abdomen: Soft, non-tender, non-distended, bowel sounds positive. Small umbilical hernia Extremities: No clubbing, cyanosis, or edema. Pulses are 2+ Psych: Pt's affect is appropriate. Pt is cooperative.  Skin: Small scabs on right great toe. Stasis changes with papery skin right shin and healed abrasions. Limb guard with wound VAC on L-BKA. Venous stasis changes on RLE- with a few papery scabs- no drainage- fistula on L Forearm Neuro:    Mental Status: He is alert and oriented to person, place, and time.     Cranial Nerves: No cranial nerve deficit.     Comments: Decreased to light touch from R knee downwards; and on LLE/BKA as well  Musculoskeletal:  UE strength 5/5 in Biceps, triceps, WE, grip and FA B/L RLE- 5/5 in HF, KE, KF- but moderate foot drop- DF 3-/5; and PF 3+/5 LLE- HF/KE/KF 5-/5- after limb guard removed VAC in place on L BKA    Assessment/Plan: 1. Functional deficits which require 3+ hours per day of interdisciplinary therapy in a comprehensive inpatient rehab setting.  Physiatrist is providing close team supervision and 24 hour management of active medical problems listed below. Physiatrist and rehab team continue to assess barriers to discharge/monitor patient progress toward functional and medical goals  Care Tool:  Bathing    Body parts bathed by patient: Right arm, Left arm, Abdomen, Chest, Front perineal area, Right upper leg, Left upper leg, Face   Body parts bathed by helper: Buttocks, Right lower leg Body parts n/a: Left lower leg (BKA)   Bathing assist Assist Level: Moderate Assistance - Patient 50 - 74%     Upper Body Dressing/Undressing Upper body dressing   What is the patient wearing?: Pull over shirt    Upper body assist Assist Level: Supervision/Verbal cueing    Lower Body  Dressing/Undressing Lower body dressing      What is the patient wearing?: Pants, Incontinence brief     Lower body assist Assist for lower body dressing: Maximal Assistance - Patient 25 - 49%     Toileting Toileting    Toileting assist Assist for toileting: Maximal Assistance - Patient 25 - 49%     Transfers Chair/bed transfer  Transfers assist     Chair/bed transfer assist level: Dependent - mechanical lift (stedy)     Locomotion Ambulation   Ambulation assist              Walk 10 feet activity   Assist           Walk 50 feet activity   Assist           Walk 150 feet activity   Assist           Walk 10 feet on uneven surface  activity   Assist           Wheelchair     Assist               Wheelchair 50 feet with 2 turns activity    Assist            Wheelchair 150 feet activity     Assist          Blood pressure 128/82, pulse 87, temperature 97.7 F (36.5 C), temperature source Oral, resp. rate 18, height 6' (1.829 m), weight 111.8 kg, SpO2 95 %.  Medical Problem List and Plan: 1. Functional deficits secondary to L BKA due to osteomyelitis of LLE             -patient may  shower after VAC removed             -ELOS/Goals: 10-14 days supervision at w/c level  -CIR therapy evals today 2.  Antithrombotics: -DVT/anticoagulation:  Pharmaceutical: Heparin             -antiplatelet therapy: NA 3. Pain Management: Oxycodone prn for pain. No phantom pain so far 4. Mood/Behavior/Sleep: LCSW to follow for evaluation and support.              -antipsychotic agents: N/A 5. Neuropsych/cognition: This patient is capable of making decisions on his own behalf. 6. Skin/Wound Care: Routine pressure relief measures.  7. Fluids/Electrolytes/Nutrition: Strict I/O. Daily weights. Enforce 1200 cc FR.             --Reports on multiple supplements--d/c nephro.  --Continue Juven bid, Zinc and Vitamin-C to promote  wound healing.  8. Left calf staph positive cultures: IV antibiotics changed to Augmentin X 19 days per recs.   9. HTN: Monitor BP TID--continue Avapro (reduced to '75mg'$  2/27) and  Demadex -STTS.   -2/28 Controlled, continue current regimen 10. T2DM: Hgb A1C- 7.4. Was on Glimepiride and Rubelsus PTA.  -- Monitor BS ac/hs and use SSI for tighter control.              --continue Insulin glargine w/ 2 units novolog TID AC   -2/28 glargine increase to 8 units yesterday, SSI changed to sensitive. CBGs elevated, Monitor CBG trend. 11. ESRD: Volume being managed with HD MWF. Is basically anuric- voided x2 since admission. Via fistula --Will schedule HD at the end of the day to help with tolerance of therapy.  12.  Leucocytosis due to LLE osteomyelitis- : Monitor for fevers and other signs of infection.              --WBC resolving 16.4-->22.2-->16.7 13. Hyponatremia: Being managed with HD.  14. Cough: SOB with cough noted during exam-->he reports at baseline X 3 weeks and it due to dry air?             --? Due to fluid overload. Will order throat spray and CXR.              --encourage pulmonary hygiene.   -2/28 CXR with atelectasis vs PNA, continue augmentin as above, start mucinex, continue incentive spirometer 15. Right hip pain: Fall 3 weeks PTA. Will order Voltaren gel TID.  16. Loose stools- due to IV ABX per pt- might need a stool bulker/fiber-      LOS: 1 days A FACE TO FACE EVALUATION WAS PERFORMED  Jennye Boroughs 06/16/2022, 10:07 AM

## 2022-06-17 DIAGNOSIS — E871 Hypo-osmolality and hyponatremia: Secondary | ICD-10-CM

## 2022-06-17 DIAGNOSIS — I10 Essential (primary) hypertension: Secondary | ICD-10-CM | POA: Diagnosis not present

## 2022-06-17 DIAGNOSIS — S88112A Complete traumatic amputation at level between knee and ankle, left lower leg, initial encounter: Secondary | ICD-10-CM | POA: Diagnosis not present

## 2022-06-17 DIAGNOSIS — R195 Other fecal abnormalities: Secondary | ICD-10-CM

## 2022-06-17 DIAGNOSIS — E1165 Type 2 diabetes mellitus with hyperglycemia: Secondary | ICD-10-CM | POA: Diagnosis not present

## 2022-06-17 DIAGNOSIS — R052 Subacute cough: Secondary | ICD-10-CM | POA: Diagnosis not present

## 2022-06-17 LAB — RESPIRATORY PANEL BY PCR

## 2022-06-17 LAB — RESP PANEL BY RT-PCR (RSV, FLU A&B, COVID)  RVPGX2
Influenza A by PCR: NEGATIVE
Influenza B by PCR: NEGATIVE
Resp Syncytial Virus by PCR: NEGATIVE
SARS Coronavirus 2 by RT PCR: NEGATIVE

## 2022-06-17 LAB — AEROBIC/ANAEROBIC CULTURE W GRAM STAIN (SURGICAL/DEEP WOUND): Gram Stain: NONE SEEN

## 2022-06-17 LAB — GLUCOSE, CAPILLARY
Glucose-Capillary: 158 mg/dL — ABNORMAL HIGH (ref 70–99)
Glucose-Capillary: 203 mg/dL — ABNORMAL HIGH (ref 70–99)
Glucose-Capillary: 195 mg/dL — ABNORMAL HIGH (ref 70–99)
Glucose-Capillary: 201 mg/dL — ABNORMAL HIGH (ref 70–99)

## 2022-06-17 LAB — HEPATITIS B SURFACE ANTIBODY, QUANTITATIVE: Hep B S AB Quant (Post): 312.9 m[IU]/mL (ref >9.9)

## 2022-06-17 NOTE — Progress Notes (Signed)
[facility] Progress Note   Subjective: patient seen and examined bedside. Tolerated hd yesterday with net uf 3.8L. has been having ongoing dry cough.  Objective Vitals:   06/16/22 2016 06/16/22 2155 06/17/22 0408 06/17/22 0500  BP: (!) 144/93 (!) 145/86 127/80   Pulse: 83 87 86   Resp: 18  18   Temp: 97.9 F (36.6 C)  98 F (36.7 C)   TempSrc: Oral  Oral   SpO2: 96%  94%   Weight:    112.6 kg  Height:       Physical Exam General: NAD Heart: S1,S2 No M/R/G Lungs: normal WOB, CTA b/l posteriorly Abdomen: NT, NABS Extremities: L BKA stump protector in place.  Dialysis Access: L AVF +T/B  Additional Objective Labs: Basic Metabolic Panel: Recent Labs  Lab 06/14/22 0226 06/15/22 0424 06/16/22 1451  NA 124* 127* 122*  K 3.9 3.9 4.6  CL 89* 90* 90*  CO2 23 21* 20*  GLUCOSE 225* 198* 214*  BUN 53* 59* 86*  CREATININE 5.49* 6.28* 7.66*  CALCIUM 8.4* 9.0 9.0  PHOS 4.3 5.0* 4.3   Liver Function Tests: Recent Labs  Lab 06/11/22 0358 06/12/22 0050 06/13/22 0951 06/14/22 0226 06/15/22 0424 06/16/22 1451  AST 50* 37  --   --   --   --   ALT 31 24  --   --   --   --   ALKPHOS 172* 118  --   --   --   --   BILITOT 0.6 0.6  --   --   --   --   PROT 6.9 5.9*  --   --   --   --   ALBUMIN 1.9* 1.6*   < > 1.6* 1.6* 1.8*   < > = values in this interval not displayed.   No results for input(s): "LIPASE", "AMYLASE" in the last 168 hours. CBC: Recent Labs  Lab 06/11/22 0358 06/12/22 0050 06/13/22 0951 06/14/22 0226 06/15/22 0424 06/16/22 1451  WBC 16.9* 16.4* 22.2* 20.4* 16.7* 18.5*  NEUTROABS 15.0* 14.3*  --   --   --   --   HGB 8.9* 8.0* 9.2* 8.5* 8.5* 8.8*  HCT 27.6* 26.0* 30.0* 26.4* 26.8* 27.6*  MCV 86.3 88.4 88.8 87.1 87.6 87.1  PLT 285 273 311 254 252 288   Blood Culture    Component Value Date/Time   SDES TISSUE 06/11/2022 1449   SPECREQUEST LEFT BKA 06/11/2022 1449   CULT  06/11/2022 1449    RARE STAPHYLOCOCCUS AUREUS NO ANAEROBES  ISOLATED; CULTURE IN PROGRESS FOR 5 DAYS    REPTSTATUS PENDING 06/11/2022 1449    Cardiac Enzymes: No results for input(s): "CKTOTAL", "CKMB", "CKMBINDEX", "TROPONINI" in the last 168 hours.  CBG: Recent Labs  Lab 06/15/22 2059 06/16/22 0606 06/16/22 1159 06/16/22 2107 06/17/22 0552  GLUCAP 284* 165* 215* 148* 158*   Iron Studies: No results for input(s): "IRON", "TIBC", "TRANSFERRIN", "FERRITIN" in the last 72 hours. '@lablastinr3'$ @ Studies/Results: DG Chest 2 View  Result Date: 06/15/2022 CLINICAL DATA:  Cough EXAM: CHEST - 2 VIEW COMPARISON:  Chest x-ray 03/16/2022 FINDINGS: There are patchy right basilar opacities. There is no pleural effusion or pneumothorax. Lung volumes are low. The heart is mildly enlarged. No evidence for pneumothorax or acute fracture. IMPRESSION: 1. Patchy right basilar opacities may represent atelectasis or pneumonia. 2. Low lung volumes. Electronically Signed   By: Ronney Asters M.D.   On: 06/15/2022 20:14   Medications:    amoxicillin-clavulanate  1 tablet Oral q1800   vitamin C  1,000 mg Oral Daily   atorvastatin  40 mg Oral Daily   calcitRIOL  1.25 mcg Oral Q M,W,F-HD   carvedilol  25 mg Oral BID WC   [START ON 06/21/2022] darbepoetin (ARANESP) injection - DIALYSIS  200 mcg Subcutaneous Q Mon-1800   diclofenac Sodium  2 g Topical TID AC   Gerhardt's butt cream   Topical BID   guaiFENesin  600 mg Oral BID   heparin  5,000 Units Subcutaneous Q8H   insulin aspart  0-5 Units Subcutaneous QHS   insulin aspart  0-9 Units Subcutaneous TID with meals   insulin aspart  2 Units Subcutaneous TID WC   insulin glargine-yfgn  8 Units Subcutaneous Daily   irbesartan  75 mg Oral Daily   lactobacillus  1 g Oral TID WC   multivitamin  1 tablet Oral QHS   nutrition supplement (JUVEN)  1 packet Oral BID BM   pantoprazole  40 mg Oral Daily   sucroferric oxyhydroxide  1,000 mg Oral TID WC   torsemide  100 mg Oral Once per day on Sun Tue Thu Sat   zinc sulfate   220 mg Oral Daily     Dialysis Orders: [facility] MWF 4h 78mn  400/800   108.5kg   2/2 bath  LFA AVF   - No Heparin  - rocaltrol 1.25 mcg po tiw - mircera 225 mcg IV  q 2wks, last 2/12, due 2/26 - last Hb 8.6 on 2/14   Assessment/Plan: 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 IV antibiotics per primary.  Hyponatremia - worsening in the setting of hypervolemia. managing with HD/UF. Fluid restrict ESRD - on HD MWF HTN/ volume - Remains very much above OP EDW with hyponatremia. Decreased irbesartan to 75 mg PO daily  to allow for more volume to be removed. Glycemic control plays role in high gains. UF as tolerated and optimize volume with HD.  Anemia esrd - Hgb 8.8. s/p 1 unit prbcs 2/22. 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 Cont po vdra and velphoro as binder.  DM2 per pmd. Chronic hyperglycemia, poorly controlled DMT2.  Nutrition - hypoalbuminemia- Continue protein supplement. Fluid restriction discussed with patient.     VGean Quint MD [facility]

## 2022-06-17 NOTE — Plan of Care (Signed)
Problem: Consults Goal: RH LIMB LOSS PATIENT EDUCATION Description: Description: See Patient Education module for eduction specifics. Outcome: Progressing   Problem: RH BOWEL ELIMINATION Goal: RH STG MANAGE BOWEL WITH ASSISTANCE Description: STG Manage Bowel with mod I Assistance. Outcome: Progressing Goal: RH STG MANAGE BOWEL W/MEDICATION W/ASSISTANCE Description: STG Manage Bowel with Medication with mod I Assistance. Outcome: Progressing   Problem: RH SKIN INTEGRITY Goal: RH STG SKIN FREE OF INFECTION/BREAKDOWN Description: Manage skin/incisions w min assist Outcome: Progressing Goal: RH STG ABLE TO PERFORM INCISION/WOUND CARE W/ASSISTANCE Description: STG Able To Perform Incision/Wound Care With min Assistance. Outcome: Progressing   Problem: RH SAFETY Goal: RH STG ADHERE TO SAFETY PRECAUTIONS W/ASSISTANCE/DEVICE Description: STG Adhere to Safety Precautions With cues Assistance/Device. Outcome: Progressing   Problem: RH PAIN MANAGEMENT Goal: RH STG PAIN MANAGED AT OR BELOW PT'S PAIN GOAL Description: < 4 with prns Outcome: Progressing   Problem: RH KNOWLEDGE DEFICIT LIMB LOSS Goal: RH STG INCREASE KNOWLEDGE OF SELF CARE AFTER LIMB LOSS Description: Patient and spouse will be able to manage care at discharge using educational resources independently Outcome: Progressing   Problem: Education: Goal: Ability to describe self-care measures that may prevent or decrease complications (Diabetes Survival Skills Education) will improve Outcome: Progressing Goal: Individualized Educational Video(s) Outcome: Progressing   Problem: Coping: Goal: Ability to adjust to condition or change in health will improve Outcome: Progressing   Problem: Fluid Volume: Goal: Ability to maintain a balanced intake and output will improve Outcome: Progressing   Problem: Health Behavior/Discharge Planning: Goal: Ability to identify and utilize available resources and services will  improve Outcome: Progressing Goal: Ability to manage health-related needs will improve Outcome: Progressing   Problem: Metabolic: Goal: Ability to maintain appropriate glucose levels will improve Outcome: Progressing   Problem: Nutritional: Goal: Maintenance of adequate nutrition will improve Outcome: Progressing Goal: Progress toward achieving an optimal weight will improve Outcome: Progressing   Problem: Skin Integrity: Goal: Risk for impaired skin integrity will decrease Outcome: Progressing   Problem: Tissue Perfusion: Goal: Adequacy of tissue perfusion will improve Outcome: Progressing   Problem: Education: Goal: Knowledge of the prescribed therapeutic regimen will improve Outcome: Progressing Goal: Ability to verbalize activity precautions or restrictions will improve Outcome: Progressing Goal: Understanding of discharge needs will improve Outcome: Progressing   Problem: Activity: Goal: Ability to perform//tolerate increased activity and mobilize with assistive devices will improve Outcome: Progressing   Problem: Clinical Measurements: Goal: Postoperative complications will be avoided or minimized Outcome: Progressing   Problem: Self-Care: Goal: Ability to meet self-care needs will improve Outcome: Progressing   Problem: Self-Concept: Goal: Ability to maintain and perform role responsibilities to the fullest extent possible will improve Outcome: Progressing   Problem: Pain Management: Goal: Pain level will decrease with appropriate interventions Outcome: Progressing   Problem: Skin Integrity: Goal: Demonstration of wound healing without infection will improve Outcome: Progressing   Problem: Education: Goal: Knowledge of General Education information will improve Description: Including pain rating scale, medication(s)/side effects and non-pharmacologic comfort measures Outcome: Progressing   Problem: Health Behavior/Discharge Planning: Goal: Ability  to manage health-related needs will improve Outcome: Progressing   Problem: Clinical Measurements: Goal: Ability to maintain clinical measurements within normal limits will improve Outcome: Progressing Goal: Will remain free from infection Outcome: Progressing Goal: Diagnostic test results will improve Outcome: Progressing Goal: Respiratory complications will improve Outcome: Progressing Goal: Cardiovascular complication will be avoided Outcome: Progressing   Problem: Activity: Goal: Risk for activity intolerance will decrease Outcome: Progressing   Problem: Nutrition: Goal: Adequate nutrition  will be maintained Outcome: Progressing   Problem: Coping: Goal: Level of anxiety will decrease Outcome: Progressing   Problem: Elimination: Goal: Will not experience complications related to bowel motility Outcome: Progressing Goal: Will not experience complications related to urinary retention Outcome: Progressing   Problem: Pain Managment: Goal: General experience of comfort will improve Outcome: Progressing   Problem: Safety: Goal: Ability to remain free from injury will improve Outcome: Progressing   Problem: Skin Integrity: Goal: Risk for impaired skin integrity will decrease Outcome: Progressing

## 2022-06-17 NOTE — Progress Notes (Signed)
Occupational Therapy Session Note  Patient Details  Name: Scott Castillo MRN: [id number] Date of Birth: 08/04/1960  Today's Date: 06/17/2022 OT Individual Time: 1030-1115 1st session, 1445-1531 2nd session  OT Individual Time Calculation (min): 45 min, 46 min    Short Term Goals: Week 1:  OT Short Term Goal 1 (Week 1): Pt will stand from EOB with mod A OT Short Term Goal 2 (Week 1): Pt will don pants sit <> stand with mod A using LRAD OT Short Term Goal 3 (Week 1): Pt will complete stand pivot transfer with mod A  Skilled Therapeutic Interventions/Progress Updates:   Session 1:  Pt seen for skilled OT this am. Droplet precautions set up on door and OT chart review and nurse confirmation pt being tested for respiratory illness d/t prolonged cough. OT donned all appropriate PPE and pt greeted in bed. Pt open to working with OT this am but feeling fatigued from HD and all the "commotion" of testing. Bed level and EOB level tx conducted. Pt reports he does not feel ill but has had a prolonged cough for about a month. Pt reported low to no pain in L residual limb. Pt requested face washing and oral care and done with set up EOB with close S for mobility. Pt requested to rest back in bed and completed med level foam cube block grasp, pinch and roll strengthening for intrinsics Bly (t with + mm atrophy and wasting evident). 10 reps Bly x 3 sets. Issued red tband for triceps press and pt completed 3 sets of 10 reps. Pt left bed level with needs, bed exit and nurse call button in reach.   Session 2:  Pt now off Droplet Precautions as confirmed with nursing. Pt up in w/c eager for sink side self care retraining session as pt was unable to perform earlier due to fatigue and testing. Pt performed seated UB sponge bathing w/c level with set up including doffing and re-donning scrub pull over shirt sink side. Pt requested to shave. Pt with significant increased hair growth thus task required increased time  and B hand coordination for shaving cream, washcloth and razor manipulation. Pt left in w/c with chair alarm active and with nursing who arrived at end of session with meds.      Therapy Documentation Precautions:  Precautions Precautions: Fall Precaution Comments: L BKA Other Brace: L limb protector Restrictions Weight Bearing Restrictions: Yes LLE Weight Bearing: Non weight bearing    Therapy/Group: Individual Therapy  Scott Castillo 06/17/2022, 7:44 AM

## 2022-06-17 NOTE — Discharge Instructions (Signed)
Inpatient Rehab Discharge Instructions  [facility] Discharge date and time:    Activities/Precautions/ Functional Status: Activity: no lifting, driving, or strenuous exercise for till cleared by MD Diet: diabetic diet and renal diet limit fluids to 1200 cc/day Wound Care: Wash with soap and water. Keep clean and dry.    Functional status:  ___ No restrictions     ___ Walk up steps independently ___ 24/7 supervision/assistance   ___ Walk up steps with assistance ___ Intermittent supervision/assistance  ___ Bathe/dress independently ___ Walk with walker     ___ Bathe/dress with assistance ___ Walk Independently    ___ Shower independently ___ Walk with assistance    ___ Shower with assistance ___ No alcohol     ___ Return to work/school ________    Special Instructions:    My questions have been answered and I understand these instructions. I will adhere to these goals and the provided educational materials after my discharge from the hospital.  Patient/Caregiver Signature _______________________________ Date __________  Clinician Signature _______________________________________ Date __________  Please bring this form and your medication list with you to all your follow-up doctor's appointments.

## 2022-06-17 NOTE — Progress Notes (Signed)
Physical Therapy Session Note  Patient Details  Name: Scott Castillo MRN: [id number] Date of Birth: Nov 10, 1960  Today's Date: 06/17/2022 PT Individual Time: 1338-1440 PT Individual Time Calculation (min): 62 min   Short Term Goals: Week 1:  PT Short Term Goal 1 (Week 1): Patient will performed sit/stand with LRAD and ModA PT Short Term Goal 2 (Week 1): Patient will performed stand pivot transfer with LRAD and Mod/MaxA x1 PT Short Term Goal 3 (Week 1): Patient will initiate gait training  Skilled Therapeutic Interventions/Progress Updates: Pt presented in bed agreeable to therapy. Pt noted to be off precautions (verified with nsg). Pt states pain well controlled but feels fatigued overall. Performed bed mobility with supervision and use of bed features. Pt donned scrub top mod I. PTA set up slide board and pt performed slide board transfer to w/c at level surface with CGA. Pt required min cues for improved head hips relationship. Pt propelled w/c to main gym requiring x 3 rest breaks due to fatigue. In gym pt attempted to set up Slide board but placed too posteriorly, PTA provided demonstration on improved Slide board placement. PTA then set up Slide board and pt was able to perform transfer to slightly elevated surface with CGA. At [facility] pt participated in use of rebounder with 1Kg weighted ball 3 x 10. Pt also performed chops 2 x 10 with rebounder. Pt required increased time between bouts due to fatigue. Pt was then able to place Slide board on mat with supervision and transfer to w/c with CGA. PTA noted that R ELR very short therefore adjusted leg rest for improved hip/LE positioning. Pt transported to ortho gym for time management and participated in UBE L7 x 4 min (2 min forward/backwards). Pt then transported back to room and agreeable to remain in w/c until next session. Pt left in w/c at end of session with call bell within reach and current needs met.       Therapy Documentation Precautions:   Precautions Precautions: Fall Precaution Comments: L BKA Other Brace: L limb protector Restrictions Weight Bearing Restrictions: Yes LLE Weight Bearing: Non weight bearing General:   Vital Signs:   Pain:   Mobility:   Locomotion :    Trunk/Postural Assessment :    Balance:   Exercises:   Other Treatments:      Therapy/Group: Individual Therapy  [name] 06/17/2022, 3:24 PM

## 2022-06-17 NOTE — Progress Notes (Signed)
Patient ID: Scott Castillo, male   DOB: 1960/11/23, 61 y.o.   MRN: [id number] Met with the patient to review current situation, rehab process, team conference and plan of care. Reviewed persistent cough; new orders for RSV/COVID/CXR, etc. Reviewed secondary risks including DM (A!C7.4), HTN, HLD and ESRD; HD M-W-F. Abx for OM caused loose stools; lactobacillus on board. Venous stasis dermatitis right shin area with two small wounds on right great toe and stage 2 on coccyx. Wound vac on left BKA site, no drainage noted. States an understanding of nutritional needs with 1200 cc FR. Continue to follow along to address educational needs to facilitate preparation for discharge. [name]

## 2022-06-17 NOTE — Progress Notes (Signed)
Physical Therapy Session Note  Patient Details  Name: Scott Castillo MRN: [id number] Date of Birth: 1960/07/13  Today's Date: 06/17/2022 PT Individual Time: [id number] PT Individual Time Calculation (min): 60 min   Short Term Goals: Week 1:  PT Short Term Goal 1 (Week 1): Patient will performed sit/stand with LRAD and ModA PT Short Term Goal 2 (Week 1): Patient will performed stand pivot transfer with LRAD and Mod/MaxA x1 PT Short Term Goal 3 (Week 1): Patient will initiate gait training  Skilled Therapeutic Interventions/Progress Updates:      Therapy Documentation Precautions:  Precautions Precautions: Fall Precaution Comments: L BKA Other Brace: L limb protector Restrictions Weight Bearing Restrictions: Yes LLE Weight Bearing: Non weight bearing   Pt received semi-reclined in bed, agreeable to PT session. Nephrologist rounded with patient and inquired about flu and covid swabs due to persistent cough, Nursing and MD made aware, pt placed on airborne and contact precautions. PT donned appropriate PPE. Pt declines pain in session.   Pt requires supervision with supine to sit with use of bed features and attempted sit to stand 3 times, unable to clear and therefore deferred to bed level for lower body dressing in which pt prefers min A. Pt requires supervision for upper body dressing.   Pt performed 3 x 5 R LE gluteal bridges with min cues to straighten left knee with exercise. Pt performed 3 x 10 of L knee extension, PT educated importance of strengthening and shaping of left residual limb to prepare for prosthesis.   Pt left semi-reclined in bed with all needs in reach and alarm on.    Therapy/Group: Individual Therapy  Verl Dicker Verl Dicker PT, DPT  06/17/2022, 7:49 AM

## 2022-06-17 NOTE — Progress Notes (Signed)
[facility] Individual Statement of Services  Patient Name:  Scott Castillo  Date:  06/17/2022  Welcome to the [facility].  Our goal is to provide you with an individualized program based on your diagnosis and situation, designed to meet your specific needs.  With this comprehensive rehabilitation program, you will be expected to participate in at least 3 hours of rehabilitation therapies Monday-Friday, with modified therapy programming on the weekends.  Your rehabilitation program will include the following services:  Physical Therapy (PT), Occupational Therapy (OT), 24 hour per day rehabilitation nursing, Therapeutic Recreaction (TR), Neuropsychology, Care Coordinator, Rehabilitation Medicine, Nutrition Services, and Pharmacy Services  Weekly team conferences will be held on Wednesday to discuss your progress.  Your Inpatient Rehabilitation Care Coordinator will talk with you frequently to get your input and to update you on team discussions.  Team conferences with you and your family in attendance may also be held.  Expected length of stay: 2-3 weeks  Overall anticipated outcome: CGA level-10 ft ambulation  Depending on your progress and recovery, your program may change. Your Inpatient Rehabilitation Care Coordinator will coordinate services and will keep you informed of any changes. Your Inpatient Rehabilitation Care Coordinator's name and contact numbers are listed  below.  The following services may also be recommended but are not provided by the [facility]:   [place] will be made to provide these services after discharge if needed.  Arrangements include referral to agencies that provide these services.  Your insurance has been verified to be:  BCBS and Medicare [facility] primary doctor is:  Teressa Lower  Pertinent information will be shared  with your doctor and your insurance company.  Inpatient Rehabilitation Care Coordinator:  Ovidio Kin, [place] or Emilia Beck  Information discussed with and copy given to patient by: Elease Hashimoto, 06/17/2022, 8:53 AM

## 2022-06-17 NOTE — Progress Notes (Signed)
PROGRESS NOTE   Subjective/Complaints: Respiratory panel was ordered today for continued cough.     Review of Systems  Constitutional:  Negative for chills, fever and malaise/fatigue.  HENT:  Positive for congestion.   Respiratory:  Positive for cough.   Cardiovascular:  Negative for chest pain and palpitations.  Gastrointestinal:  Negative for abdominal pain, nausea and vomiting.  Musculoskeletal:  Positive for joint pain.  Neurological:  Positive for weakness. Negative for headaches.      .  Objective:   DG Chest 2 View  Result Date: 06/15/2022 CLINICAL DATA:  Cough EXAM: CHEST - 2 VIEW COMPARISON:  Chest x-ray 03/16/2022 FINDINGS: There are patchy right basilar opacities. There is no pleural effusion or pneumothorax. Lung volumes are low. The heart is mildly enlarged. No evidence for pneumothorax or acute fracture. IMPRESSION: 1. Patchy right basilar opacities may represent atelectasis or pneumonia. 2. Low lung volumes. Electronically Signed   By: Ronney Asters M.D.   On: 06/15/2022 20:14      Recent Labs    06/15/22 0424 06/16/22 1451  WBC 16.7* 18.5*  HGB 8.5* 8.8*  HCT 26.8* 27.6*  PLT 252 288    Recent Labs    06/15/22 0424 06/16/22 1451  NA 127* 122*  K 3.9 4.6  CL 90* 90*  CO2 21* 20*  GLUCOSE 198* 214*  BUN 59* 86*  CREATININE 6.28* 7.66*  CALCIUM 9.0 9.0     Intake/Output Summary (Last 24 hours) at 06/17/2022 0849 Last data filed at 06/17/2022 0700 Gross per 24 hour  Intake 597 ml  Output 3800 ml  Net -3203 ml         Physical Exam: Vital Signs Blood pressure 127/80, pulse 86, temperature 98 F (36.7 C), temperature source Oral, resp. rate 18, height 6' (1.829 m), weight 112.6 kg, SpO2 94 %.   General: No apparent distress HEENT: Head is normocephalic, atraumatic,conjugate gaze,  oral mucosa moist Neck: Supple without JVD or lymphadenopathy Heart: Reg rate and rhythm. No murmurs  rubs or gallops Chest: CTA bilaterally without wheezes, rales, or rhonchi. Intermittent cough noted Abdomen: Soft, non-tender, non-distended, bowel sounds positive. Small umbilical hernia Extremities: No clubbing, cyanosis, or edema. Pulses are 2+ Psych: Pt's affect is appropriate. Pt is cooperative.  Skin: Small scabs on right great toe. Stasis changes with papery skin right shin and healed abrasions. Limb guard with wound VAC on L-BKA. Venous stasis changes on RLE- with a few papery scabs- no drainage- fistula on L Forearm Neuro:    Mental Status: He is alert and oriented to person, place, and time.     Cranial Nerves:2-12 grossly intact    Comments: Decreased to light touch from R knee downwards; and on LLE/BKA as well  Musculoskeletal:  UE strength 5/5 in Biceps, triceps, WE, grip and FA B/L RLE- 5/5 in HF, KE, KF- but moderate foot drop- DF 3-/5; and PF 3+/5 LLE- HF/KE/KF 5-/5- after limb guard removed VAC in place on L BKA    Assessment/Plan: 1. Functional deficits which require 3+ hours per day of interdisciplinary therapy in a comprehensive inpatient rehab setting. Physiatrist is providing close team supervision and 24 hour management of active medical  problems listed below. Physiatrist and rehab team continue to assess barriers to discharge/monitor patient progress toward functional and medical goals  Care Tool:  Bathing    Body parts bathed by patient: Right arm, Left arm, Abdomen, Chest, Front perineal area, Right upper leg, Left upper leg, Face   Body parts bathed by helper: Buttocks, Right lower leg Body parts n/a: Left lower leg (BKA)   Bathing assist Assist Level: Moderate Assistance - Patient 50 - 74%     Upper Body Dressing/Undressing Upper body dressing   What is the patient wearing?: Pull over shirt    Upper body assist Assist Level: Supervision/Verbal cueing    Lower Body Dressing/Undressing Lower body dressing      What is the patient wearing?: Pants,  Incontinence brief     Lower body assist Assist for lower body dressing: Maximal Assistance - Patient 25 - 49%     Toileting Toileting    Toileting assist Assist for toileting: Maximal Assistance - Patient 25 - 49%     Transfers Chair/bed transfer  Transfers assist     Chair/bed transfer assist level: Moderate Assistance - Patient 50 - 74% (Via slideboard)     Locomotion Ambulation   Ambulation assist   Ambulation activity did not occur: Safety/medical concerns (Unable to ambulated at this time secondary to new BKA with global deconditioning)          Walk 10 feet activity   Assist  Walk 10 feet activity did not occur: Safety/medical concerns        Walk 50 feet activity   Assist Walk 50 feet with 2 turns activity did not occur: Safety/medical concerns         Walk 150 feet activity   Assist Walk 150 feet activity did not occur: Safety/medical concerns         Walk 10 feet on uneven surface  activity   Assist Walk 10 feet on uneven surfaces activity did not occur: Safety/medical concerns         Wheelchair     Assist Is the patient using a wheelchair?: Yes Type of Wheelchair: Manual    Wheelchair assist level: Supervision/Verbal cueing Max wheelchair distance: >150'    Wheelchair 50 feet with 2 turns activity    Assist        Assist Level: Supervision/Verbal cueing   Wheelchair 150 feet activity     Assist      Assist Level: Supervision/Verbal cueing   Blood pressure 127/80, pulse 86, temperature 98 F (36.7 C), temperature source Oral, resp. rate 18, height 6' (1.829 m), weight 112.6 kg, SpO2 94 %.  Medical Problem List and Plan: 1. 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  Antithrombotics: -DVT/anticoagulation:  Pharmaceutical:  Heparin             -antiplatelet therapy: NA 3. Pain Management: Oxycodone prn for pain. No phantom pain so far 4. Mood/Behavior/Sleep: LCSW to follow for evaluation and support.              -antipsychotic agents: N/A 5. Neuropsych/cognition: This patient is capable of making decisions on his own behalf. 6. Skin/Wound Care: Routine pressure relief measures.  7. Fluids/Electrolytes/Nutrition: Strict I/O. Daily weights. Enforce 1200 cc  FR.             --Reports on multiple supplements--d/c nephro.  --Continue Juven bid, Zinc and Vitamin-C to promote wound healing.  -Hyponatremia- Nephrology following and adjusting mediations/dialysis appreciate assistance 8. Left calf staph positive cultures: IV antibiotics changed to Augmentin X 19 days per recs.   9. HTN: Monitor BP TID--continue Avapro (reduced to '75mg'$  2/27) and Demadex -STTS.   -2/29 well controlled overall     06/17/2022    5:00 AM 06/17/2022    4:08 AM 06/16/2022    9:55 PM  Vitals with BMI  Weight 248 lbs 4 oz    BMI [id number]    Systolic  [id number]  Diastolic  80 86  Pulse  86 87    10. T2DM: Hgb A1C- 7.4. Was on Glimepiride and Rubelsus PTA.  -- Monitor BS ac/hs and use SSI for tighter control.              --continue Insulin glargine w/ 2 units novolog TID AC   -2/28 glargine increase to 8 units yesterday, SSI changed to sensitive. CBGs elevated, Monitor CBG trend.  -2/29 CBGs improving, continue to monitor  11. ESRD: Volume being managed with HD MWF. Is basically anuric- voided x2 since admission. Via fistula --Will schedule HD at the end of the day to help with tolerance of therapy.  12.  Leucocytosis due to LLE osteomyelitis- : Monitor for fevers and other signs of infection.              --WBC resolving 16.4-->22.2-->16.7--> 18.5 2/29 13. Hyponatremia: Being managed with HD.  14. Cough: SOB with cough noted during exam-->he reports at baseline X 3 weeks and it due to dry air?             --? Due to fluid overload. Will  order throat spray and CXR.              --encourage pulmonary hygiene.   -2/28 CXR with atelectasis vs PNA, continue augmentin as above, start mucinex, continue incentive spirometer  -2/29 resp panel was ordered, covid/influenza negative 15. Right hip pain: Fall 3 weeks PTA. Will order Voltaren gel TID.  16. Loose stools- due to IV ABX per pt- might need a stool bulker/fiber-   -2/29 Multiple large Bms last night and this AM      LOS: 2 days A FACE TO FACE EVALUATION WAS PERFORMED  Jennye Boroughs 06/17/2022, 8:49 AM

## 2022-06-18 DIAGNOSIS — E1165 Type 2 diabetes mellitus with hyperglycemia: Secondary | ICD-10-CM | POA: Diagnosis not present

## 2022-06-18 DIAGNOSIS — R052 Subacute cough: Secondary | ICD-10-CM | POA: Diagnosis not present

## 2022-06-18 DIAGNOSIS — S88112A Complete traumatic amputation at level between knee and ankle, left lower leg, initial encounter: Secondary | ICD-10-CM | POA: Diagnosis not present

## 2022-06-18 DIAGNOSIS — I10 Essential (primary) hypertension: Secondary | ICD-10-CM | POA: Diagnosis not present

## 2022-06-18 LAB — CBC
HCT: 30.4 % — ABNORMAL LOW (ref 39.0–52.0)
Hemoglobin: 9.3 g/dL — ABNORMAL LOW (ref 13.0–17.0)
MCH: 27.2 pg (ref 26.0–34.0)
MCHC: 30.6 g/dL (ref 30.0–36.0)
MCV: 88.9 fL (ref 80.0–100.0)
Platelets: 280 10*3/uL (ref 150–400)
RBC: 3.42 MIL/uL — ABNORMAL LOW (ref 4.22–5.81)
RDW: 17.5 % — ABNORMAL HIGH (ref 11.5–15.5)
WBC: 18.1 10*3/uL — ABNORMAL HIGH (ref 4.0–10.5)
nRBC: 0 % (ref 0.0–0.2)

## 2022-06-18 LAB — GLUCOSE, CAPILLARY
Glucose-Capillary: 148 mg/dL — ABNORMAL HIGH (ref 70–99)
Glucose-Capillary: 168 mg/dL — ABNORMAL HIGH (ref 70–99)
Glucose-Capillary: 156 mg/dL — ABNORMAL HIGH (ref 70–99)
Glucose-Capillary: 229 mg/dL — ABNORMAL HIGH (ref 70–99)

## 2022-06-18 LAB — RENAL FUNCTION PANEL
Albumin: 2 g/dL — ABNORMAL LOW (ref 3.5–5.0)
Anion gap: 12 (ref 5–15)
BUN: 72 mg/dL — ABNORMAL HIGH (ref 6–20)
CO2: 24 mmol/L (ref 22–32)
Calcium: 9.6 mg/dL (ref 8.9–10.3)
Chloride: 94 mmol/L — ABNORMAL LOW (ref 98–111)
Creatinine, Ser: 7.58 mg/dL — ABNORMAL HIGH (ref 0.61–1.24)
GFR, Estimated: 8 mL/min — ABNORMAL LOW (ref >60)
Glucose, Bld: 169 mg/dL — ABNORMAL HIGH (ref 70–99)
Phosphorus: 4.5 mg/dL (ref 2.5–4.6)
Potassium: 5.2 mmol/L — ABNORMAL HIGH (ref 3.5–5.1)
Sodium: 130 mmol/L — ABNORMAL LOW (ref 135–145)

## 2022-06-18 MED ORDER — CALCIUM POLYCARBOPHIL 625 MG PO TABS
625.0000 mg | ORAL_TABLET | Freq: Two times a day (BID) | ORAL | Status: DC
  Administered 2022-06-18 – 2022-07-08 (×39): 625 mg via ORAL
  Filled 2022-06-18 (×39): qty 1

## 2022-06-18 MED ORDER — INSULIN ASPART 100 UNIT/ML IJ SOLN
3.0000 [IU] | Freq: Three times a day (TID) | INTRAMUSCULAR | Status: DC
  Administered 2022-06-18 – 2022-06-23 (×15): 3 [IU] via SUBCUTANEOUS

## 2022-06-18 MED ORDER — AMOXICILLIN-POT CLAVULANATE 500-125 MG PO TABS
1.0000 | ORAL_TABLET | Freq: Every day | ORAL | Status: AC
  Administered 2022-06-18 – 2022-07-03 (×15): 1 via ORAL
  Filled 2022-06-18 (×17): qty 1

## 2022-06-18 NOTE — IPOC Note (Signed)
Overall Plan of Care [facility]) Patient Details Name: Scott Castillo MRN: [id number] DOB: 1960/05/04  Admitting Diagnosis: Unilateral complete BKA, left, initial encounter [facility])  Hospital Problems: Principal Problem:   Unilateral complete BKA, left, initial encounter [facility])     Functional Problem List: Nursing Bladder, Bowel, Safety, Edema, Endurance, Medication Management, Pain  PT Balance, Perception, Edema, Sensory, Endurance, Skin Integrity, Motor, Pain, Nutrition  OT Balance, Endurance, Motor, Pain, Sensory, Skin Integrity, Edema  SLP    TR         Basic ADL's: OT Dressing, Toileting, Bathing     Advanced  ADL's: OT       Transfers: PT Bed Mobility, Bed to Chair, Car  OT Toilet     Locomotion: PT Ambulation, Wheelchair Mobility, Stairs     Additional Impairments: OT None  SLP        TR      Anticipated Outcomes Item Anticipated Outcome  Self Feeding (S)  Swallowing      Basic self-care  (S)  Toileting  CGA   Bathroom Transfers CGA  Bowel/Bladder  manage bowel w mod I assist  Transfers  Supv LRAD- Wheelchair level transfers  Locomotion  CGA/MinA for short household gait distances with LRAD  Communication     Cognition     Pain  < 4 with prns  Safety/Judgment  manage w cues   Therapy Plan: PT Intensity: Minimum of 1-2 x/day ,45 to 90 minutes PT Frequency: 5 out of 7 days PT Duration Estimated Length of Stay: 2-3 weeks OT Intensity: Minimum of 1-2 x/day, 45 to 90 minutes OT Frequency: 5 out of 7 days OT Duration/Estimated Length of Stay: 3 weeks     Team Interventions: Nursing Interventions Bowel Management, Bladder Management, Medication Management, Discharge Planning, Skin Care/Wound Management, Disease Management/Prevention, Patient/Family Education, Pain Management  PT interventions Ambulation/gait training, Community reintegration, DME/adaptive equipment instruction, Neuromuscular re-education, Psychosocial support, Stair training, UE/LE  Strength taining/ROM, Wheelchair propulsion/positioning, Training and development officer, Discharge planning, Pain management, Skin care/wound management, Therapeutic Activities, UE/LE Coordination activities, Cognitive remediation/compensation, Disease management/prevention, Functional mobility training, Patient/family education, Splinting/orthotics, Therapeutic Exercise, Visual/perceptual remediation/compensation  OT Interventions Training and development officer, DME/adaptive equipment instruction, Patient/family education, Therapeutic Activities, Psychosocial support, Therapeutic Exercise, Wheelchair propulsion/positioning, Functional mobility training, UE/LE Strength taining/ROM, Self Care/advanced ADL retraining, Discharge planning, Skin care/wound managment, UE/LE Coordination activities, Pain management, Splinting/orthotics  SLP Interventions    TR Interventions    SW/CM Interventions Discharge Planning, Psychosocial Support, Patient/Family Education   Barriers to Discharge MD  Medical stability, Wound care, Hemodialysis, and Weight bearing restrictions  Nursing Decreased caregiver support, Home environment access/layout, Hemodialysis 1 level mobile home 4 ste bil rail w spouse, ESRD/HD M-W-F  PT Home environment access/layout, Wound Care, Hemodialysis, Insurance for SNF coverage, Weight, Nutrition means    OT      SLP      SW Insurance for SNF coverage, Hemodialysis, Home environment access/layout     Team Discharge Planning: Destination: PT-Home ,OT- Home , SLP-  Projected Follow-up: PT-Home health PT, OT-  Home health OT, SLP-  Projected Equipment Needs: PT-To be determined, OT- To be determined, SLP-  Equipment Details: PT-TBD, OT-  Patient/family involved in discharge planning: PT- Patient,  OT-Patient, SLP-   MD ELOS: 10-14 Medical Rehab Prognosis:  Excellent Assessment: The patient has been admitted for CIR therapies with the diagnosis of  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 The  team will be addressing functional mobility, strength, stamina, balance, safety, adaptive techniques and equipment, self-care, bowel  and bladder mgt, patient and caregiver education. Goals have been set at [employer]. Anticipated discharge destination is home.        See Team Conference Notes for weekly updates to the plan of care

## 2022-06-18 NOTE — Progress Notes (Signed)
Received patient in bed to unit.  Alert and oriented.  Informed consent signed and in chart.   TX duration:2.5 hrs. Pt cut off 1.5 hrs d/t needs bathroom.Dr. Candiss Norse notified. AMA form signed.  Patient tolerated well.  Transported back to the room  Alert, without acute distress.  Hand-off given to patient's nurse.   Access used: AVF Access issues: none  Total UF removed: 2.2 L Medication(s) given: Calcitriol 1.25 mcg PO Post HD VS: 144/70 P 96 R 20. O2 sat 96 % in room air. Post HD weight: 112 kg   [facility]

## 2022-06-18 NOTE — Progress Notes (Signed)
Physical Therapy Session Note  Patient Details  Name: Scott Castillo MRN: [id number] Date of Birth: 12-29-1960  Today's Date: 06/18/2022 PT Individual Time: [id number] PT Individual Time Calculation (min): 57 min   Short Term Goals: Week 1:  PT Short Term Goal 1 (Week 1): Patient will performed sit/stand with LRAD and ModA PT Short Term Goal 2 (Week 1): Patient will performed stand pivot transfer with LRAD and Mod/MaxA x1 PT Short Term Goal 3 (Week 1): Patient will initiate gait training  Skilled Therapeutic Interventions/Progress Updates:    Pt received in [facility] and agreeable to therapy session. RN at bedside providing morning meds. Pt self propelled WC to main gym with supervision and assist for lines (wound vac), 1 rest break required for fatigue. Pt completed slide board transfer WC>mat table, Min assist with cues to weight shift to position slide board for transfer, CGA for scoot WC>mat table. Supine exercises completed on mat table with supervision to move sit>supine.  Supine and sidelying exercises completed on table: 3 second holds for each - bridges; 2x10 on Rt LE - SAQ 1x10 Lt LE - hip flexion/SLR, 1x10 bil LE - Lt hip abduction in sidelying, 2x 10 manual blocking to prevent posterior rotation at lower trunk and to maintain hip extension as pt tends to flex due to hip flexor tightness  Seated exercises: - bil UE shoulder press 6lbs weights, 1x 10  - bil bicep curls 6lbs weights, 1x 10  Pt completed transfer mat table>WC with slide board, CGA, pt required cues for positioning slideboard but no assist this transfer. Pt dependently rolled back to room for time management.   - triceps press up in WC x5 for pressure relief, pt having difficulty due to Rt shoulder pain and demonstrated posterior lean for positioning to be able to clear buttocks partially from [facility] cushion. EOS pt remained in WC, chair alarm on, all needs and call bell within reach.  Therapy Documentation Precautions:   Precautions Precautions: Fall Precaution Comments: L BKA Other Brace: L limb protector Restrictions Weight Bearing Restrictions: Yes LLE Weight Bearing: Non weight bearing    Therapy/Group: Individual Therapy   Verner Mould, DPT Acute Rehabilitation Services Office [phone]  06/18/22 7:18 AM

## 2022-06-18 NOTE — Progress Notes (Signed)
Occupational Therapy Session Note  Patient Details  Name: Scott Castillo MRN: [id number] Date of Birth: 03-Feb-1961  Today's Date: 06/18/2022 OT Individual Time: [id number] OT Individual Time Calculation (min): 72 min    Short Term Goals: Week 1:  OT Short Term Goal 1 (Week 1): Pt will stand from EOB with mod A OT Short Term Goal 2 (Week 1): Pt will don pants sit <> stand with mod A using LRAD OT Short Term Goal 3 (Week 1): Pt will complete stand pivot transfer with mod A  Skilled Therapeutic Interventions/Progress Updates:    Pt received supine with "very minimal" pain in his residual limb (un-rated), agreeable to OT session. He declined ADLs. He came to EOB with (S). He donned limb guard with min A for upper straps d/t FMC deficits. He was able to place the slideboard with mod A. Cueing for positioning adjustment. He completed lateral scoot with CGA, heavy support on w/c d/t it sliding (will notify rehab tech to tighten locks). He propelled the w/c to the therapy gym, 125 ft, with (S) overall , however limited by endurance and required several rest breaks. He came into the parallel bars and completed sit > stand with mod A. Worked on progressing to hopping/pivoting on the RLE for carryover to ADL transfers. He required mod A overall with heavy UE support on the // bars. He required an extended rest break. His 2nd trial was less successful and he was able to tolerate about 30 seconds of standing with no pivoting or hopping. He stood a 3rd time with increased assist, heavier mod- max A to bring weight forward. 4th-5th trial with similar heavy max A and inability to sustain stand for longer than 20 seconds. Transitioned to UE ergometer to address UE strengthening and endurance for carryover to Adls and transfers. He completed 5 min forward followed by a 2 min rest and then 5 min backward. Activity also intended to improve cardiorespiratory/vascular endurance. He returned to his room and was left sitting  up with all needs met. Wound vac plugged in.   Pt with nose bleed intermittently throughout session. Pt required cueing to stop blowing nose and apply pressure at the bridge. He reports this frequently occurs in the winter for him.    Therapy Documentation Precautions:  Precautions Precautions: Fall Precaution Comments: L BKA Other Brace: L limb protector Restrictions Weight Bearing Restrictions: Yes LLE Weight Bearing: Non weight bearing  Therapy/Group: Individual Therapy  Curtis Sites 06/18/2022, 6:54 AM

## 2022-06-18 NOTE — Progress Notes (Signed)
[facility] Progress Note   Subjective: patient seen and examined bedside. No acute events. Still with dry cough. Struggling with fluid restriction (ongoing issue)  Objective Vitals:   06/17/22 1925 06/18/22 0300 06/18/22 0351 06/18/22 0600  BP: 135/79 (!) 148/98  (!) 142/95  Pulse: 87 69  62  Resp: 18 18    Temp: 97.7 F (36.5 C) 97.7 F (36.5 C)    TempSrc: Oral Oral    SpO2: 95% 98%  98%  Weight:   114 kg   Height:       Physical Exam General: NAD, sitting in wheelchair Heart: S1,S2 No M/R/G Lungs: normal WOB, CTA b/l posteriorly Abdomen: NT, NABS Extremities: L BKA stump protector in place.  Dialysis Access: L AVF +T/B  Additional Objective Labs: Basic Metabolic Panel: Recent Labs  Lab 06/14/22 0226 06/15/22 0424 06/16/22 1451  NA 124* 127* 122*  K 3.9 3.9 4.6  CL 89* 90* 90*  CO2 23 21* 20*  GLUCOSE 225* 198* 214*  BUN 53* 59* 86*  CREATININE 5.49* 6.28* 7.66*  CALCIUM 8.4* 9.0 9.0  PHOS 4.3 5.0* 4.3   Liver Function Tests: Recent Labs  Lab 06/12/22 0050 06/13/22 0951 06/14/22 0226 06/15/22 0424 06/16/22 1451  AST 37  --   --   --   --   ALT 24  --   --   --   --   ALKPHOS 118  --   --   --   --   BILITOT 0.6  --   --   --   --   PROT 5.9*  --   --   --   --   ALBUMIN 1.6*   < > 1.6* 1.6* 1.8*   < > = values in this interval not displayed.   No results for input(s): "LIPASE", "AMYLASE" in the last 168 hours. CBC: Recent Labs  Lab 06/12/22 0050 06/13/22 0951 06/14/22 0226 06/15/22 0424 06/16/22 1451  WBC 16.4* 22.2* 20.4* 16.7* 18.5*  NEUTROABS 14.3*  --   --   --   --   HGB 8.0* 9.2* 8.5* 8.5* 8.8*  HCT 26.0* 30.0* 26.4* 26.8* 27.6*  MCV 88.4 88.8 87.1 87.6 87.1  PLT 273 311 254 252 288   Blood Culture    Component Value Date/Time   SDES TISSUE 06/11/2022 1449   SPECREQUEST LEFT BKA 06/11/2022 1449   CULT  06/11/2022 1449    RARE STAPHYLOCOCCUS AUREUS NO ANAEROBES ISOLATED Performed at [facility] [address] [place], [place] [address]    REPTSTATUS 06/17/2022 FINAL 06/11/2022 1449    Cardiac Enzymes: No results for input(s): "CKTOTAL", "CKMB", "CKMBINDEX", "TROPONINI" in the last 168 hours.  CBG: Recent Labs  Lab 06/17/22 0552 06/17/22 1121 06/17/22 1639 06/17/22 2106 06/18/22 0552  GLUCAP 158* 203* 195* 201* 148*   Iron Studies: No results for input(s): "IRON", "TIBC", "TRANSFERRIN", "FERRITIN" in the last 72 hours. '@lablastinr3'$ @ Studies/Results: No results found. Medications:    amoxicillin-clavulanate  1 tablet Oral q1800   vitamin C  1,000 mg Oral Daily   atorvastatin  40 mg Oral Daily   calcitRIOL  1.25 mcg Oral Q M,W,F-HD   carvedilol  25 mg Oral BID WC   [START ON 06/21/2022] darbepoetin (ARANESP) injection - DIALYSIS  200 mcg Subcutaneous Q Mon-1800   diclofenac Sodium  2 g Topical TID AC   Gerhardt's butt cream   Topical BID   guaiFENesin  600 mg Oral BID   heparin  5,000 Units Subcutaneous Q8H   insulin aspart  0-5 Units Subcutaneous QHS   insulin aspart  0-9 Units Subcutaneous TID with meals   insulin aspart  2 Units Subcutaneous TID WC   insulin glargine-yfgn  8 Units Subcutaneous Daily   irbesartan  75 mg Oral Daily   lactobacillus  1 g Oral TID WC   multivitamin  1 tablet Oral QHS   nutrition supplement (JUVEN)  1 packet Oral BID BM   pantoprazole  40 mg Oral Daily   sucroferric oxyhydroxide  1,000 mg Oral TID WC   torsemide  100 mg Oral Once per day on Sun Tue Thu Sat   zinc sulfate  220 mg Oral Daily     Dialysis Orders: [facility] MWF 4h 19mn  400/800   108.5kg   2/2 bath  LFA AVF   - No Heparin  - rocaltrol 1.25 mcg po tiw - mircera 225 mcg IV  q 2wks, last 2/12, due 2/26 - last Hb 8.6 on 2/14   Assessment/Plan: 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 antibiotics per primary.  Hyponatremia - worsening in the setting of hypervolemia. managing with HD/UF. Fluid restriction reinforced ESRD - on HD MWF HTN/ volume - Remains very much  above OP EDW with hyponatremia. Decreased irbesartan to 75 mg PO daily  to allow for more volume to be removed. Glycemic control plays role in high gains. UF as tolerated and optimize volume with HD.  Anemia esrd - Hgb 8.8. s/p 1 unit prbcs 2/22. 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 Cont po vdra and velphoro as binder.  DM2 per pmd. Chronic hyperglycemia, poorly controlled DMT2.  Nutrition - hypoalbuminemia- Continue protein supplement. Fluid restriction discussed with patient again.   VGean Quint MD [facility]

## 2022-06-18 NOTE — Progress Notes (Addendum)
PROGRESS NOTE   Subjective/Complaints: Respiratory panel yesterday was negative.  Cough a little better.  Reports therapy is going well. No additional concerns.     Review of Systems  Constitutional:  Negative for chills, fever and malaise/fatigue.  HENT:  Positive for congestion.   Respiratory:  Positive for cough. Negative for sputum production and shortness of breath.   Cardiovascular:  Negative for chest pain and palpitations.  Gastrointestinal:  Negative for abdominal pain, nausea and vomiting.  Musculoskeletal:  Positive for joint pain.  Neurological:  Positive for weakness. Negative for headaches.      .  Objective:   No results found.    Recent Labs    06/16/22 1451  WBC 18.5*  HGB 8.8*  HCT 27.6*  PLT 288    Recent Labs    06/16/22 1451  NA 122*  K 4.6  CL 90*  CO2 20*  GLUCOSE 214*  BUN 86*  CREATININE 7.66*  CALCIUM 9.0     Intake/Output Summary (Last 24 hours) at 06/18/2022 0852 Last data filed at 06/18/2022 0809 Gross per 24 hour  Intake 460 ml  Output 0 ml  Net 460 ml         Physical Exam: Vital Signs Blood pressure (!) 142/95, pulse 62, temperature 97.7 F (36.5 C), temperature source Oral, resp. rate 18, height 6' (1.829 m), weight 114 kg, SpO2 98 %.   General: No apparent distress HEENT: Head is normocephalic, atraumatic,conjugate gaze,  oral mucosa moist Neck: Supple without JVD or lymphadenopathy Heart: Reg rate and rhythm. No murmurs rubs or gallops Chest: CTA bilaterally without wheezes, rales, or rhonchi. Intermittent cough noted Abdomen: Soft, non-tender, non-distended, bowel sounds positive. Small umbilical hernia Extremities: LBKA Psych: Pt's affect is a little flat Skin: Small scabs on right great toe. Stasis changes with papery skin right shin and healed abrasions. Limb guard with wound VAC on L-BKA. Venous stasis changes on RLE- with a few papery scabs- no  drainage- fistula on L Forearm Neuro:    Alert and awake, no CN deficits noted    Comments: Decreased to light touch from R knee downwards; and on LLE/BKA as well  Musculoskeletal:  UE strength 5/5 in Biceps, triceps, WE, grip and FA B/L RLE- 5/5 in HF, KE, KF- but moderate foot drop- DF 3-/5; and PF 3+/5 LLE- HF/KE/KF 5-/5- after limb guard removed VAC in place on L BKA    Assessment/Plan: 1. Functional deficits which require 3+ hours per day of interdisciplinary therapy in a comprehensive inpatient rehab setting. Physiatrist is providing close team supervision and 24 hour management of active medical problems listed below. Physiatrist and rehab team continue to assess barriers to discharge/monitor patient progress toward functional and medical goals  Care Tool:  Bathing    Body parts bathed by patient: Right arm, Left arm, Abdomen, Chest, Front perineal area, Right upper leg, Left upper leg, Face   Body parts bathed by helper: Buttocks, Right lower leg Body parts n/a: Left lower leg (BKA)   Bathing assist Assist Level: Moderate Assistance - Patient 50 - 74%     Upper Body Dressing/Undressing Upper body dressing   What is the patient wearing?: Pull  over shirt    Upper body assist Assist Level: Set up assist    Lower Body Dressing/Undressing Lower body dressing      What is the patient wearing?: Pants, Incontinence brief     Lower body assist Assist for lower body dressing: Maximal Assistance - Patient 25 - 49%     Toileting Toileting    Toileting assist Assist for toileting: Maximal Assistance - Patient 25 - 49%     Transfers Chair/bed transfer  Transfers assist     Chair/bed transfer assist level: Moderate Assistance - Patient 50 - 74% (Via slideboard)     Locomotion Ambulation   Ambulation assist   Ambulation activity did not occur: Safety/medical concerns (Unable to ambulated at this time secondary to new BKA with global deconditioning)           Walk 10 feet activity   Assist  Walk 10 feet activity did not occur: Safety/medical concerns        Walk 50 feet activity   Assist Walk 50 feet with 2 turns activity did not occur: Safety/medical concerns         Walk 150 feet activity   Assist Walk 150 feet activity did not occur: Safety/medical concerns         Walk 10 feet on uneven surface  activity   Assist Walk 10 feet on uneven surfaces activity did not occur: Safety/medical concerns         Wheelchair     Assist Is the patient using a wheelchair?: Yes Type of Wheelchair: Manual    Wheelchair assist level: Supervision/Verbal cueing Max wheelchair distance: >150'    Wheelchair 50 feet with 2 turns activity    Assist        Assist Level: Supervision/Verbal cueing   Wheelchair 150 feet activity     Assist      Assist Level: Supervision/Verbal cueing   Blood pressure (!) 142/95, pulse 62, temperature 97.7 F (36.5 C), temperature source Oral, resp. rate 18, height 6' (1.829 m), weight 114 kg, SpO2 98 %.  Medical Problem List and Plan: 1. 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  Antithrombotics: -DVT/anticoagulation:  Pharmaceutical: Heparin             -antiplatelet therapy: NA 3. Pain Management: Oxycodone prn for pain. No phantom pain so far 4. Mood/Behavior/Sleep: LCSW to follow for evaluation and support.              -antipsychotic agents: N/A 5. Neuropsych/cognition: This patient is capable of making decisions on his own behalf. 6. Skin/Wound Care: Routine pressure relief measures.  7. Fluids/Electrolytes/Nutrition: Strict I/O. Daily weights. Enforce 1200 cc FR.             --Reports on multiple supplements--d/c nephro.  --Continue Juven bid, Zinc and Vitamin-C to promote wound healing.   -Hyponatremia- Nephrology following and adjusting mediations/dialysis appreciate assistance 8. Left calf staph positive cultures: IV antibiotics changed to Augmentin X 19 days per recs.  (stop 07/03/22) 9. HTN: Monitor BP TID--continue Avapro (reduced to '75mg'$  2/27) and Demadex -STTS.   -3/1 intermittently elevated, continue to monitor trend     06/18/2022    6:00 AM 06/18/2022    3:51 AM 06/18/2022  3:00 AM  Vitals with BMI  Weight  251 lbs 5 oz   BMI  [id number]   Systolic [id number]  [id number]  Diastolic 95  98  Pulse 62  69    10. T2DM: Hgb A1C- 7.4. Was on Glimepiride and Rubelsus PTA.  -- Monitor BS ac/hs and use SSI for tighter control.         -2/28 glargine increase to 8 units yesterday, SSI changed to sensitive. CBGs elevated, Monitor        -continue Insulin glargine w/ 2 units novolog TID AC ---> increase to 3u TID (3/1) 11. ESRD: Volume being managed with HD MWF. Is basically anuric- voided x2 since admission. Via fistula --Will schedule HD at the end of the day to help with tolerance of therapy.  12.  Leucocytosis due to LLE osteomyelitis- : Monitor for fevers and other signs of infection.              --WBC resolving 16.4-->22.2-->16.7--> 18.5 2/29, monitor for signs of infection 13. Hyponatremia: Being managed with HD.  14. Cough: SOB with cough noted during exam-->he reports at baseline X 3 weeks and it due to dry air?             --? Due to fluid overload. Will order throat spray and CXR.              --encourage pulmonary hygiene.   -2/28 CXR with atelectasis vs PNA, continue augmentin as above, start mucinex, continue incentive spirometer  -3/1 resp panel negative, cough a little improved 15. Right hip pain: Fall 3 weeks PTA. Will order Voltaren gel TID.  16. Loose stools- due to IV ABX per pt- might need a stool bulker/fiber-   -3/1 Fibercon started '625mg'$  BID      LOS: 3 days A FACE TO FACE EVALUATION WAS PERFORMED  Jennye Boroughs 06/18/2022, 8:52 AM

## 2022-06-18 NOTE — Progress Notes (Signed)
Physical Therapy Session Note  Patient Details  Name: Scott Castillo MRN: [id number] Date of Birth: 06-17-1960  Today's Date: 06/18/2022 PT Individual Time: 1105-1205 PT Individual Time Calculation (min): 60 min   Short Term Goals: Week 1:  PT Short Term Goal 1 (Week 1): Patient will performed sit/stand with LRAD and ModA PT Short Term Goal 2 (Week 1): Patient will performed stand pivot transfer with LRAD and Mod/MaxA x1 PT Short Term Goal 3 (Week 1): Patient will initiate gait training  Skilled Therapeutic Interventions/Progress Updates: Pt presented in w/c agreeable to therapy. Pt states some mild pain in residual limb. Rest and repositioning provided as needed. Pt propelled to main gym requiring x 2 brief rest breaks due to fatigue. Pt set up in parallel bars and and with increased time and effort pt was able to stand x 1 with heavy modA. Pt was able to tolerate maintaining standing for approx 15 seconds before requiring returning to w/c. Pt was please he was able to stand due to fatigue levels. Pt then transferred over to mat, pt was able to place Slide board with min cues for correction (was sitting on wheel). Once corrected pt was able to perform transfer with CGA. Pt then participated in use of rebounder with 2Kg weighted ball for endurance 3 x 10 then ball rotations 3 x 10. Pt required increased time between bouts due to fatigue. Pt expressed increasing need for BM. When inquired how pt used bathroom stated continues to use bedpan. PTA suggested use of BSC to allow for bowels to empty which pt was very agreeable to. Pt was able to set up slide board on mat without assist and performed transfer to w/c with CGA. Pt required assist for leg rests and to place slide board under residual limb as felt it provided more support. Pt transported back to room for time management and demonstrated transfer to [facility]. NT arrived for [facility] check and stayed to assist with personal care. PTA set up Slide board and pt  was able to transfer with CGA to [facility].  Pt performed lateral leans to assist in lowering brief/pants however ultimately required pt to push up from [facility] to clear buttocks enough to allow pants/brief to be lowered. Pt with continent BM. Pt then performed heavy lean to R onto bed to allow NT to complete peri-care. Pt then attempted to perform lateral leans and push up from [facility] to pull pants/brief back up however was unable to complete clear therefore pt performed squat pivot transfer to bed with heavy modA from PTA. Pt was able to transfer to supine and pt then handed off to NT to complete peri-care and clothing management.      Therapy Documentation Precautions:  Precautions Precautions: Fall Precaution Comments: L BKA Other Brace: L limb protector Restrictions Weight Bearing Restrictions: No LLE Weight Bearing: Non weight bearing General:   Vital Signs: Therapy Vitals Temp: (!) 97.5 F (36.4 C) Pulse Rate: 87 Resp: 16 BP: 117/66 Patient Position (if appropriate): Lying Oxygen Therapy SpO2: 98 % O2 Device: Room Air   Therapy/Group: Individual Therapy  Mckinsey Keagle 06/18/2022, 3:59 PM

## 2022-06-19 LAB — GLUCOSE, CAPILLARY
Glucose-Capillary: 219 mg/dL — ABNORMAL HIGH (ref 70–99)
Glucose-Capillary: 149 mg/dL — ABNORMAL HIGH (ref 70–99)
Glucose-Capillary: 180 mg/dL — ABNORMAL HIGH (ref 70–99)

## 2022-06-19 MED ORDER — INSULIN GLARGINE-YFGN 100 UNIT/ML ~~LOC~~ SOLN
10.0000 [IU] | Freq: Every day | SUBCUTANEOUS | Status: DC
  Administered 2022-06-19 – 2022-06-21 (×3): 10 [IU] via SUBCUTANEOUS
  Filled 2022-06-19 (×3): qty 0.1

## 2022-06-19 NOTE — Progress Notes (Signed)
Occupational Therapy Session Note  Patient Details  Name: Scott Castillo MRN: [id number] Date of Birth: March 25, 1961  Today's Date: 06/19/2022 OT Individual Time: 1400-1500 OT Individual Time Calculation (min): 60 min    Short Term Goals: Week 1:  OT Short Term Goal 1 (Week 1): Pt will stand from EOB with mod A OT Short Term Goal 2 (Week 1): Pt will don pants sit <> stand with mod A using LRAD OT Short Term Goal 3 (Week 1): Pt will complete stand pivot transfer with mod A  Skilled Therapeutic Interventions/Progress Updates: Patient asleep upon offer for skilled OT services; however, he was gently awoken and was agreeable to participate.   Due to decreases in bilateral hand/digital strength, sensation, dexterity, &FM skills (especially in left hand/digits) that inhibited self care activities earlier in the date, patient was agreeable to focus most of session regarding unilateral and bilateral hand use.    He worked on the aforementioned skills and stated he felt they help.  He was able to return demonstration with verbal and demonstrative cues on a myriad of activities.  This will help with IADLs and ADLs as well as safety for increasing sensation in bilateral hands (in the case of hot items/surfaces, etc).   Many activities were left in his room to work on the aforementioned skills (putty, small items, etc).  Continue OT POC     Therapy Documentation Precautions:  Precautions Precautions: Fall Precaution Comments: L BKA Other Brace: L limb protector Restrictions Weight Bearing Restrictions: No LLE Weight Bearing: Non weight bearing  Pain:denied      Therapy/Group: Individual Therapy  Alfredia Ferguson [facility] 06/19/2022, 4:01 PM

## 2022-06-19 NOTE — Progress Notes (Signed)
[facility] Progress Note   Subjective: patient seen and examined bedside. Signed off early from HD last night given that he needed to have a bowel movt, refused bedpan at that time. Net uf 2.2L. no complaints  Objective Vitals:   06/18/22 1835 06/18/22 1919 06/18/22 1953 06/19/22 0348  BP: (!) 147/87  (!) 151/79 (!) 146/88  Pulse: 84  91 89  Resp: '20  19 16  '$ Temp: 98.4 F (36.9 C)  97.7 F (36.5 C) 98.4 F (36.9 C)  TempSrc:   Oral Oral  SpO2: 93%  97% 94%  Weight:  112 kg  108.6 kg  Height:       Physical Exam General: NAD, sitting up in bed Heart: S1,S2 No M/R/G Lungs: normal WOB, CTA b/l Abdomen: NT, NABS Extremities: L BKA stump protector in place. chronic stasis changes RLE with trace edema Dialysis Access: L AVF +T/B  Additional Objective Labs: Basic Metabolic Panel: Recent Labs  Lab 06/15/22 0424 06/16/22 1451 06/18/22 1524  NA 127* 122* 130*  K 3.9 4.6 5.2*  CL 90* 90* 94*  CO2 21* 20* 24  GLUCOSE 198* 214* 169*  BUN 59* 86* 72*  CREATININE 6.28* 7.66* 7.58*  CALCIUM 9.0 9.0 9.6  PHOS 5.0* 4.3 4.5   Liver Function Tests: Recent Labs  Lab 06/15/22 0424 06/16/22 1451 06/18/22 1524  ALBUMIN 1.6* 1.8* 2.0*   No results for input(s): "LIPASE", "AMYLASE" in the last 168 hours. CBC: Recent Labs  Lab 06/13/22 0951 06/14/22 0226 06/15/22 0424 06/16/22 1451 06/18/22 1524  WBC 22.2* 20.4* 16.7* 18.5* 18.1*  HGB 9.2* 8.5* 8.5* 8.8* 9.3*  HCT 30.0* 26.4* 26.8* 27.6* 30.4*  MCV 88.8 87.1 87.6 87.1 88.9  PLT 311 254 252 288 280   Blood Culture    Component Value Date/Time   SDES TISSUE 06/11/2022 1449   SPECREQUEST LEFT BKA 06/11/2022 1449   CULT  06/11/2022 1449    RARE STAPHYLOCOCCUS AUREUS NO ANAEROBES ISOLATED Performed at [facility] [address] [place], [place] [address]    REPTSTATUS 06/17/2022 FINAL 06/11/2022 1449    Cardiac Enzymes: No results for input(s): "CKTOTAL", "CKMB", "CKMBINDEX", "TROPONINI"  in the last 168 hours.  CBG: Recent Labs  Lab 06/18/22 0552 06/18/22 1153 06/18/22 2004 06/18/22 2239 06/19/22 0551  GLUCAP 148* 168* 156* 229* 219*   Iron Studies: No results for input(s): "IRON", "TIBC", "TRANSFERRIN", "FERRITIN" in the last 72 hours. '@lablastinr3'$ @ Studies/Results: No results found. Medications:    amoxicillin-clavulanate  1 tablet Oral q1800   vitamin C  1,000 mg Oral Daily   atorvastatin  40 mg Oral Daily   calcitRIOL  1.25 mcg Oral Q M,W,F-HD   carvedilol  25 mg Oral BID WC   [START ON 06/21/2022] darbepoetin (ARANESP) injection - DIALYSIS  200 mcg Subcutaneous Q Mon-1800   diclofenac Sodium  2 g Topical TID AC   Gerhardt's butt cream   Topical BID   guaiFENesin  600 mg Oral BID   heparin  5,000 Units Subcutaneous Q8H   insulin aspart  0-5 Units Subcutaneous QHS   insulin aspart  0-9 Units Subcutaneous TID with meals   insulin aspart  3 Units Subcutaneous TID WC   insulin glargine-yfgn  10 Units Subcutaneous Daily   irbesartan  75 mg Oral Daily   lactobacillus  1 g Oral TID WC   multivitamin  1 tablet Oral QHS   nutrition supplement (JUVEN)  1 packet Oral BID BM   pantoprazole  40 mg  Oral Daily   polycarbophil  625 mg Oral BID   sucroferric oxyhydroxide  1,000 mg Oral TID WC   torsemide  100 mg Oral Once per day on Sun Tue Thu Sat   zinc sulfate  220 mg Oral Daily     Dialysis Orders: [facility] MWF 4h 85mn  400/800   108.5kg   2/2 bath  LFA AVF   - No Heparin  - rocaltrol 1.25 mcg po tiw - mircera 225 mcg IV  q 2wks, last 2/12, due 2/26 - last Hb 8.6 on 2/14   Assessment/Plan: 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 antibiotics per primary.  Hyponatremia - worsening in the setting of hypervolemia. managing with HD/UF. Fluid restriction reinforced ESRD - on HD MWF HTN/ volume - Remains very much above OP EDW with hyponatremia. Decreased irbesartan to 75 mg PO daily  to allow for more volume to be removed. Glycemic control plays role in high  gains. UF as tolerated and optimize volume with HD.  Anemia esrd - Hgb 9.3. s/p 1 unit prbcs 2/22. 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 Cont po vdra and velphoro as binder.  DM2 per pmd. Chronic hyperglycemia, poorly controlled DMT2.  Nutrition - hypoalbuminemia- Continue protein supplement. Fluid restriction discussed with patient again.   VGean Quint MD [facility]

## 2022-06-19 NOTE — Progress Notes (Signed)
Physical Therapy Session Note  Patient Details  Name: Scott Castillo MRN: [id number] Date of Birth: Feb 21, 1961  Today's Date: 06/19/2022 PT Individual Time: 1100-1200 PT Individual Time Calculation (min): 60 min   Short Term Goals: Week 1:  PT Short Term Goal 1 (Week 1): Patient will performed sit/stand with LRAD and ModA PT Short Term Goal 2 (Week 1): Patient will performed stand pivot transfer with LRAD and Mod/MaxA x1 PT Short Term Goal 3 (Week 1): Patient will initiate gait training  Skilled Therapeutic Interventions/Progress Updates:  Patient greeted supine in bed and agreeable to PT treatment session. Patient transitioned from supine to sitting EOB with use of bed rail and supv- Patient uses momentum to right his trunk. While sitting EOB, therapist donned R tennis shoe for time management and provided education regarding foot protection secondary to impaired sensation, etc. Patient was able to verbalize steps required for transfer and how to properly align wheelchair with Min cues from therapist for flipping arm rest back. Patient was able to lean and place slideboard, as well as transfer into the wheelchair with SBA (and therapist stabilizing wheelchair for safety). Patient propelled manual wheelchair from his room to rehab gym with B UE and supv- Patient required x2 rest breaks throughout secondary to fatigue.   Patient transferred to/from wheelchair and mat table via slideboard and SBA for safety- VC for proper sequencing/set-up for transfer. Provided education on how to lock the brakes, remove arm rest, and remove leg rests. Patient was able to place slideboard and transfer with SBA and good understanding of head/hips relationship.   While sitting EOM, patient was tasked with using the push-up blocks with B UE to lift his bottom from the mat table- VC for improved anterior weight shift and pushing through R LE as well with good effort noted. Patient performed 10 with ~3 second hold each  rep.   Patient transferred back to the wheelchair from mat table same as above.   Patient stood in parallel bars x2 trials with Min/ModA- Once standing, VC for improved postural extension, forward gaze and R TKE with good improvements noted however unable to sustain secondary to poor standing tolerance. Patient tolerated standing ~15-30 seconds prior to requesting a seated rest break. Attempted to hop/lift R heel while in standing, however patient was unable to secondary to reports of fatigue post dialysis day (per patient report).   Patient returned to his room sitting upright in wheelchair in room with call bell within reach and all needs met. Patient agreeable to using the call button when ready to transfer back to bed with RN notified and aware.    Therapy Documentation Precautions:  Precautions Precautions: Fall Precaution Comments: L BKA Other Brace: L limb protector Restrictions Weight Bearing Restrictions: No LLE Weight Bearing: Non weight bearing  Therapy/Group: Individual Therapy  [name] 06/19/2022, 7:48 AM

## 2022-06-19 NOTE — Progress Notes (Signed)
PROGRESS NOTE   Subjective/Complaints:  No pain c/os , wife has FMLA forms that need to be completed   Review of Systems  Constitutional:  Negative for chills, fever and malaise/fatigue.  HENT:  Positive for congestion.   Respiratory:  Positive for cough. Negative for sputum production and shortness of breath.   Cardiovascular:  Negative for chest pain and palpitations.  Gastrointestinal:  Negative for abdominal pain, nausea and vomiting.  Musculoskeletal:  Positive for joint pain.  Neurological:  Positive for weakness. Negative for headaches.      .  Objective:   No results found.    Recent Labs    06/16/22 1451 06/18/22 1524  WBC 18.5* 18.1*  HGB 8.8* 9.3*  HCT 27.6* 30.4*  PLT 288 280    Recent Labs    06/16/22 1451 06/18/22 1524  NA 122* 130*  K 4.6 5.2*  CL 90* 94*  CO2 20* 24  GLUCOSE 214* 169*  BUN 86* 72*  CREATININE 7.66* 7.58*  CALCIUM 9.0 9.6     Intake/Output Summary (Last 24 hours) at 06/19/2022 0732 Last data filed at 06/19/2022 0513 Gross per 24 hour  Intake 740 ml  Output 2200 ml  Net -1460 ml         Physical Exam: Vital Signs Blood pressure (!) 146/88, pulse 89, temperature 98.4 F (36.9 C), temperature source Oral, resp. rate 16, height 6' (1.829 m), weight 108.6 kg, SpO2 94 %.    General: No acute distress Mood and affect are appropriate Heart: Regular rate and rhythm no rubs murmurs or extra sounds Lungs: Clear to auscultation, breathing unlabored, no rales or wheezes Abdomen: Positive bowel sounds, soft nontender to palpation, nondistended Extremities: L BKA   Psych: Pt's affect is a little flat Skin: Small scabs on right great toe. Stasis changes with papery skin right shin and healed abrasions. Limb guard with wound VAC on L-BKA. Venous stasis changes on RLE- with a few papery scabs- no drainage- fistula on L Forearm Neuro:    Alert and awake, no CN deficits  noted    Comments: Decreased to light touch from R mid shin downwards; Musculoskeletal:  UE strength 5/5 in Biceps, triceps, WE, grip and FA B/L RLE- 5/5 in HF, KE, KF- but moderate foot drop- DF 2-/5; and PF 2-/5 LLE- HF/KE/KF 5-/5- after limb guard removed VAC in place on L BKA    Assessment/Plan: 1. Functional deficits which require 3+ hours per day of interdisciplinary therapy in a comprehensive inpatient rehab setting. Physiatrist is providing close team supervision and 24 hour management of active medical problems listed below. Physiatrist and rehab team continue to assess barriers to discharge/monitor patient progress toward functional and medical goals  Care Tool:  Bathing    Body parts bathed by patient: Right arm, Left arm, Abdomen, Chest, Front perineal area, Right upper leg, Left upper leg, Face   Body parts bathed by helper: Buttocks, Right lower leg Body parts n/a: Left lower leg (BKA)   Bathing assist Assist Level: Moderate Assistance - Patient 50 - 74%     Upper Body Dressing/Undressing Upper body dressing   What is the patient wearing?: Pull over shirt  Upper body assist Assist Level: Set up assist    Lower Body Dressing/Undressing Lower body dressing      What is the patient wearing?: Pants, Incontinence brief     Lower body assist Assist for lower body dressing: Maximal Assistance - Patient 25 - 49%     Toileting Toileting    Toileting assist Assist for toileting: Maximal Assistance - Patient 25 - 49%     Transfers Chair/bed transfer  Transfers assist     Chair/bed transfer assist level: Minimal Assistance - Patient > 75% (slideboard)     Locomotion Ambulation   Ambulation assist   Ambulation activity did not occur: Safety/medical concerns (Unable to ambulated at this time secondary to new BKA with global deconditioning)          Walk 10 feet activity   Assist  Walk 10 feet activity did not occur: Safety/medical concerns         Walk 50 feet activity   Assist Walk 50 feet with 2 turns activity did not occur: Safety/medical concerns         Walk 150 feet activity   Assist Walk 150 feet activity did not occur: Safety/medical concerns         Walk 10 feet on uneven surface  activity   Assist Walk 10 feet on uneven surfaces activity did not occur: Safety/medical concerns         Wheelchair     Assist Is the patient using a wheelchair?: Yes Type of Wheelchair: Manual    Wheelchair assist level: Supervision/Verbal cueing Max wheelchair distance: 90    Wheelchair 50 feet with 2 turns activity    Assist        Assist Level: Supervision/Verbal cueing   Wheelchair 150 feet activity     Assist      Assist Level: Supervision/Verbal cueing   Blood pressure (!) 146/88, pulse 89, temperature 98.4 F (36.9 C), temperature source Oral, resp. rate 16, height 6' (1.829 m), weight 108.6 kg, SpO2 94 %.  Medical Problem List and Plan: 1. 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  Antithrombotics: -DVT/anticoagulation:  Pharmaceutical: Heparin             -antiplatelet therapy: NA 3. Pain Management: Oxycodone prn for pain. No phantom pain so far 4. Mood/Behavior/Sleep: LCSW to follow for evaluation and support.              -antipsychotic agents: N/A 5. Neuropsych/cognition: This patient is capable of making decisions on his own behalf. 6. Skin/Wound Care: Routine pressure relief measures.  7. Fluids/Electrolytes/Nutrition: Strict I/O. Daily weights. Enforce 1200 cc FR.             --Reports on multiple supplements--d/c nephro.  --Continue Juven bid, Zinc and Vitamin-C to promote wound healing.  -Hyponatremia- Nephrology following and adjusting mediations/dialysis appreciate assistance 8. Left calf staph  positive cultures: IV antibiotics changed to Augmentin X 19 days per recs.  (stop 07/03/22) 9. HTN: Monitor BP TID--continue Avapro (reduced to '75mg'$  2/27) and Demadex -STTS.   -3/1 intermittently elevated, continue to monitor trend     06/19/2022    3:48 AM 06/18/2022    7:53 PM 06/18/2022    7:19 PM  Vitals  with BMI  Weight 239 lbs 7 oz  246 lbs 15 oz  BMI [id number]  [id number]  Systolic [id number]   Diastolic 88 79   Pulse 89 91     10. T2DM: Hgb A1C- 7.4. Was on Glimepiride and Rubelsus PTA.  -- Monitor BS ac/hs and use SSI for tighter control.         -2/28 glargine increase to 8 units yesterday, SSI changed to sensitive. CBGs elevated, Monitor        -continue Insulin glargine w/ 2 units novolog TID AC ---> increase to 3u TID (3/1) CBG (last 3)  Recent Labs    06/18/22 2004 06/18/22 2239 06/19/22 0551  GLUCAP 156* 229* 219*  Increase glargine for am CBG elevation  11. ESRD: Volume being managed with HD MWF. Is basically anuric- voided x2 since admission. Via fistula --Will schedule HD at the end of the day to help with tolerance of therapy.  12.  Leucocytosis due to LLE osteomyelitis- : Monitor for fevers and other signs of infection.              --WBC resolving 16.4-->22.2-->16.7--> 18.5 2/29, monitor for signs of infection 13. Hyponatremia: Being managed with HD.  14. Cough: SOB with cough noted during exam-->he reports at baseline X 3 weeks and it due to dry air?             --? Due to fluid overload. Will order throat spray and CXR.              --encourage pulmonary hygiene.   -2/28 CXR with atelectasis vs PNA, continue augmentin as above, start mucinex, continue incentive spirometer  -3/1 resp panel negative, cough a little improved 15. Right hip pain: Fall 3 weeks PTA. Will order Voltaren gel TID.  16. Loose stools- due to IV ABX per pt- might need a stool bulker/fiber-   -3/1 Fibercon started '625mg'$  BID      LOS: 4 days A FACE TO FACE EVALUATION WAS PERFORMED  Charlett Blake 06/19/2022, 7:32 AM

## 2022-06-19 NOTE — Progress Notes (Signed)
Occupational Therapy Session Note  Patient Details  Name: MUSCAB GOODLOW MRN: [id number] Date of Birth: 07/31/1960  Today's Date: 06/19/2022 OT Individual Time: [id number] OT Individual Time Calculation (min): 82 min    Short Term Goals: Week 1:  OT Short Term Goal 1 (Week 1): Pt will stand from EOB with mod A OT Short Term Goal 2 (Week 1): Pt will don pants sit <> stand with mod A using LRAD OT Short Term Goal 3 (Week 1): Pt will complete stand pivot transfer with mod A  Skilled Therapeutic Interventions/Progress Updates: Patient supine in bed upon approach agreeable to participate and participated with focus as follows:   Supine to bed of bed = close S                  Patient with pericare batheing similated EOB (He stated he would not have enough room to complete in w/c at sink and preferred EOB)= verbal cues and extra time.        LB dressing of pants and tighten up reside leg cover and wound safety cover EOB = Min A due to navigating wound care line through pant leg and residual holder and wound safety cover.  Additional assist to pull off the velcro straps.  Patient demosntrarted decreased L hand/digital dexterity and FM coordination  Discussed using reacher to obtain items off floor or don pants on weaker days (today he pulled left residual extremity onto bed and turned to left slightly sideways to lift right LE off floor to meet pant leg.  This was less stressful and exhausting to his cardiopulmonary system as compared to when he bent forward to don right pant leg.  UB bathing and dressing EOB= set up     Patient required multiple rest breaks and was educated on work simplification and energy conservation techniques  Patient will benefit from more opportunities to increase independence, cardiopulmonary endurance within safe endurance tolerance and exertion level.   As well, he will benefit from more opportunities for increase bilateral hand sensation, FM, dexterity skills....  especially in R UE.  He asked to ly back onto bed to rest after this session.  Continue OT POC.  Therapy Documentation Precautions:  Precautions Precautions: Fall Precaution Comments: L BKA Other Brace: L limb protector Restrictions Weight Bearing Restrictions: No LLE Weight Bearing: Non weight bearing   Pain:denied     Therapy/Group: Individual Therapy  Alfredia Ferguson [facility] 06/19/2022, 1:41 PM

## 2022-06-20 LAB — GLUCOSE, CAPILLARY
Glucose-Capillary: 239 mg/dL — ABNORMAL HIGH (ref 70–99)
Glucose-Capillary: 153 mg/dL — ABNORMAL HIGH (ref 70–99)
Glucose-Capillary: 190 mg/dL — ABNORMAL HIGH (ref 70–99)
Glucose-Capillary: 234 mg/dL — ABNORMAL HIGH (ref 70–99)

## 2022-06-20 MED ORDER — LOPERAMIDE HCL 2 MG PO CAPS
2.0000 mg | ORAL_CAPSULE | Freq: Three times a day (TID) | ORAL | Status: DC | PRN
  Administered 2022-06-20 – 2022-06-22 (×3): 2 mg via ORAL
  Filled 2022-06-20 (×3): qty 1

## 2022-06-20 NOTE — Progress Notes (Signed)
[facility] Progress Note   Subjective: patient seen and examined bedside. No acute events, no complaints this AM  Objective Vitals:   06/19/22 0348 06/19/22 1245 06/19/22 2255 06/20/22 0411  BP: (!) 146/88 (!) 143/87 (!) 153/70 (!) 138/95  Pulse: 89 80 75 70  Resp: '16 18 18 18  '$ Temp: 98.4 F (36.9 C) 97.8 F (36.6 C) 98.2 F (36.8 C) 97.8 F (36.6 C)  TempSrc: Oral   Oral  SpO2: 94% 94% 95% 93%  Weight: 108.6 kg   110.1 kg  Height:       Physical Exam General: NAD Heart: S1,S2 No M/R/G Lungs: normal WOB, CTA b/l Abdomen: NT, NABS Extremities: L BKA stump protector in place. chronic stasis changes RLE with trace edema Dialysis Access: L AVF +T/B  Additional Objective Labs: Basic Metabolic Panel: Recent Labs  Lab 06/15/22 0424 06/16/22 1451 06/18/22 1524  NA 127* 122* 130*  K 3.9 4.6 5.2*  CL 90* 90* 94*  CO2 21* 20* 24  GLUCOSE 198* 214* 169*  BUN 59* 86* 72*  CREATININE 6.28* 7.66* 7.58*  CALCIUM 9.0 9.0 9.6  PHOS 5.0* 4.3 4.5   Liver Function Tests: Recent Labs  Lab 06/15/22 0424 06/16/22 1451 06/18/22 1524  ALBUMIN 1.6* 1.8* 2.0*   No results for input(s): "LIPASE", "AMYLASE" in the last 168 hours. CBC: Recent Labs  Lab 06/13/22 0951 06/14/22 0226 06/15/22 0424 06/16/22 1451 06/18/22 1524  WBC 22.2* 20.4* 16.7* 18.5* 18.1*  HGB 9.2* 8.5* 8.5* 8.8* 9.3*  HCT 30.0* 26.4* 26.8* 27.6* 30.4*  MCV 88.8 87.1 87.6 87.1 88.9  PLT 311 254 252 288 280   Blood Culture    Component Value Date/Time   SDES TISSUE 06/11/2022 1449   SPECREQUEST LEFT BKA 06/11/2022 1449   CULT  06/11/2022 1449    RARE STAPHYLOCOCCUS AUREUS NO ANAEROBES ISOLATED Performed at [facility] [address] [place], [place] [address]    REPTSTATUS 06/17/2022 FINAL 06/11/2022 1449    Cardiac Enzymes: No results for input(s): "CKTOTAL", "CKMB", "CKMBINDEX", "TROPONINI" in the last 168 hours.  CBG: Recent Labs  Lab 06/18/22 2239 06/19/22 0551  06/19/22 1202 06/19/22 2111 06/20/22 0610  GLUCAP 229* 219* 149* 180* 239*   Iron Studies: No results for input(s): "IRON", "TIBC", "TRANSFERRIN", "FERRITIN" in the last 72 hours. '@lablastinr3'$ @ Studies/Results: No results found. Medications:    amoxicillin-clavulanate  1 tablet Oral q1800   vitamin C  1,000 mg Oral Daily   atorvastatin  40 mg Oral Daily   calcitRIOL  1.25 mcg Oral Q M,W,F-HD   carvedilol  25 mg Oral BID WC   [START ON 06/21/2022] darbepoetin (ARANESP) injection - DIALYSIS  200 mcg Subcutaneous Q Mon-1800   diclofenac Sodium  2 g Topical TID AC   Gerhardt's butt cream   Topical BID   guaiFENesin  600 mg Oral BID   heparin  5,000 Units Subcutaneous Q8H   insulin aspart  0-5 Units Subcutaneous QHS   insulin aspart  0-9 Units Subcutaneous TID with meals   insulin aspart  3 Units Subcutaneous TID WC   insulin glargine-yfgn  10 Units Subcutaneous Daily   irbesartan  75 mg Oral Daily   lactobacillus  1 g Oral TID WC   multivitamin  1 tablet Oral QHS   nutrition supplement (JUVEN)  1 packet Oral BID BM   pantoprazole  40 mg Oral Daily   polycarbophil  625 mg Oral BID   sucroferric oxyhydroxide  1,000 mg Oral TID  WC   torsemide  100 mg Oral Once per day on Sun Tue Thu Sat   zinc sulfate  220 mg Oral Daily     Dialysis Orders: [facility] MWF 4h 67mn  400/800   108.5kg   2/2 bath  LFA AVF   - No Heparin  - rocaltrol 1.25 mcg po tiw - mircera 225 mcg IV  q 2wks, last 2/12, due 2/26 - last Hb 8.6 on 2/14   Assessment/Plan: 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 antibiotics per primary.  Hyponatremia - worsening in the setting of hypervolemia. managing with HD/UF. Fluid restriction reinforced ESRD - on HD MWF HTN/ volume - Remains very much above OP EDW with hyponatremia. Decreased irbesartan to 75 mg PO daily  to allow for more volume to be removed. Glycemic control plays role in high gains. UF as tolerated and optimize volume with HD. BP acceptable Anemia esrd -  Hgb 9.3 (improving). s/p 1 unit prbcs 2/22. 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 Cont po vdra and velphoro as binder.  DM2 per pmd. Chronic hyperglycemia, poorly controlled DMT2.  Nutrition - hypoalbuminemia- Continue protein supplement. Fluid restriction  VGean Quint MD [facility]

## 2022-06-21 DIAGNOSIS — I10 Essential (primary) hypertension: Secondary | ICD-10-CM | POA: Diagnosis not present

## 2022-06-21 DIAGNOSIS — E1165 Type 2 diabetes mellitus with hyperglycemia: Secondary | ICD-10-CM | POA: Diagnosis not present

## 2022-06-21 DIAGNOSIS — R052 Subacute cough: Secondary | ICD-10-CM | POA: Diagnosis not present

## 2022-06-21 DIAGNOSIS — S88112A Complete traumatic amputation at level between knee and ankle, left lower leg, initial encounter: Secondary | ICD-10-CM | POA: Diagnosis not present

## 2022-06-21 LAB — CBC
HCT: 29.6 % — ABNORMAL LOW (ref 39.0–52.0)
Hemoglobin: 9.1 g/dL — ABNORMAL LOW (ref 13.0–17.0)
MCH: 27.4 pg (ref 26.0–34.0)
MCHC: 30.7 g/dL (ref 30.0–36.0)
MCV: 89.2 fL (ref 80.0–100.0)
Platelets: 252 10*3/uL (ref 150–400)
RBC: 3.32 MIL/uL — ABNORMAL LOW (ref 4.22–5.81)
RDW: 17.6 % — ABNORMAL HIGH (ref 11.5–15.5)
WBC: 15.8 10*3/uL — ABNORMAL HIGH (ref 4.0–10.5)
nRBC: 0 % (ref 0.0–0.2)

## 2022-06-21 LAB — RENAL FUNCTION PANEL
Albumin: 2.1 g/dL — ABNORMAL LOW (ref 3.5–5.0)
Anion gap: 14 (ref 5–15)
BUN: 85 mg/dL — ABNORMAL HIGH (ref 6–20)
CO2: 22 mmol/L (ref 22–32)
Calcium: 9.5 mg/dL (ref 8.9–10.3)
Chloride: 95 mmol/L — ABNORMAL LOW (ref 98–111)
Creatinine, Ser: 9.21 mg/dL — ABNORMAL HIGH (ref 0.61–1.24)
GFR, Estimated: 6 mL/min — ABNORMAL LOW (ref >60)
Glucose, Bld: 194 mg/dL — ABNORMAL HIGH (ref 70–99)
Phosphorus: 4.9 mg/dL — ABNORMAL HIGH (ref 2.5–4.6)
Potassium: 4.9 mmol/L (ref 3.5–5.1)
Sodium: 131 mmol/L — ABNORMAL LOW (ref 135–145)

## 2022-06-21 LAB — GLUCOSE, CAPILLARY
Glucose-Capillary: 240 mg/dL — ABNORMAL HIGH (ref 70–99)
Glucose-Capillary: 171 mg/dL — ABNORMAL HIGH (ref 70–99)
Glucose-Capillary: 186 mg/dL — ABNORMAL HIGH (ref 70–99)

## 2022-06-21 MED ORDER — INSULIN GLARGINE-YFGN 100 UNIT/ML ~~LOC~~ SOLN
12.0000 [IU] | Freq: Every day | SUBCUTANEOUS | Status: DC
  Administered 2022-06-22 – 2022-06-27 (×6): 12 [IU] via SUBCUTANEOUS
  Filled 2022-06-21 (×7): qty 0.12

## 2022-06-21 NOTE — Progress Notes (Signed)
[facility] Progress Note   Subjective: On HD, no issues reported. UF as tolerated.     Objective Vitals:   06/20/22 1906 06/21/22 0433 06/21/22 0435 06/21/22 1300  BP: (!) 144/85 (!) 140/80  122/81  Pulse: 90 75  72  Resp: '16 18  18  '$ Temp: 98.3 F (36.8 C) 98.1 F (36.7 C)  97.7 F (36.5 C)  TempSrc: Oral Oral  Oral  SpO2: 94% 92%  92%  Weight:   108.5 kg   Height:       Physical Exam General: chronically ill appearing male in NAD Heart: S1,S2 No M/R/G Lungs: CTAB. No WOB.  Abdomen: NT, NABS Extremities: L BKA stump protector in place. chronic stasis changes RLE with trace edema Dialysis Access: L AVF +T/B   Additional Objective Labs: Basic Metabolic Panel: Recent Labs  Lab 06/16/22 1451 06/18/22 1524 06/21/22 1253  NA 122* 130* 131*  K 4.6 5.2* 4.9  CL 90* 94* 95*  CO2 20* 24 22  GLUCOSE 214* 169* 194*  BUN 86* 72* 85*  CREATININE 7.66* 7.58* 9.21*  CALCIUM 9.0 9.6 9.5  PHOS 4.3 4.5 4.9*   Liver Function Tests: Recent Labs  Lab 06/16/22 1451 06/18/22 1524 06/21/22 1253  ALBUMIN 1.8* 2.0* 2.1*   No results for input(s): "LIPASE", "AMYLASE" in the last 168 hours. CBC: Recent Labs  Lab 06/15/22 0424 06/16/22 1451 06/18/22 1524 06/21/22 1253  WBC 16.7* 18.5* 18.1* 15.8*  HGB 8.5* 8.8* 9.3* 9.1*  HCT 26.8* 27.6* 30.4* 29.6*  MCV 87.6 87.1 88.9 89.2  PLT 252 288 280 252   Blood Culture    Component Value Date/Time   SDES TISSUE 06/11/2022 1449   SPECREQUEST LEFT BKA 06/11/2022 1449   CULT  06/11/2022 1449    RARE STAPHYLOCOCCUS AUREUS NO ANAEROBES ISOLATED Performed at [facility] [address], [place], [place] [address]    REPTSTATUS 06/17/2022 FINAL 06/11/2022 1449    Cardiac Enzymes: No results for input(s): "CKTOTAL", "CKMB", "CKMBINDEX", "TROPONINI" in the last 168 hours. CBG: Recent Labs  Lab 06/20/22 1131 06/20/22 1626 06/20/22 2105 06/21/22 0603 06/21/22 1204  GLUCAP 153* 190* 234* 240* 171*    Iron Studies: No results for input(s): "IRON", "TIBC", "TRANSFERRIN", "FERRITIN" in the last 72 hours. '@lablastinr3'$ @ Studies/Results: No results found. Medications:   amoxicillin-clavulanate  1 tablet Oral q1800   vitamin C  1,000 mg Oral Daily   atorvastatin  40 mg Oral Daily   calcitRIOL  1.25 mcg Oral Q M,W,F-HD   carvedilol  25 mg Oral BID WC   darbepoetin (ARANESP) injection - DIALYSIS  200 mcg Subcutaneous Q Mon-1800   diclofenac Sodium  2 g Topical TID AC   Gerhardt's butt cream   Topical BID   guaiFENesin  600 mg Oral BID   heparin  5,000 Units Subcutaneous Q8H   insulin aspart  0-5 Units Subcutaneous QHS   insulin aspart  0-9 Units Subcutaneous TID with meals   insulin aspart  3 Units Subcutaneous TID WC   [START ON 06/22/2022] insulin glargine-yfgn  12 Units Subcutaneous Daily   irbesartan  75 mg Oral Daily   lactobacillus  1 g Oral TID WC   multivitamin  1 tablet Oral QHS   nutrition supplement (JUVEN)  1 packet Oral BID BM   pantoprazole  40 mg Oral Daily   polycarbophil  625 mg Oral BID   sucroferric oxyhydroxide  1,000 mg Oral TID WC   torsemide  100 mg Oral Once per  day on Sun Tue Thu Sat   zinc sulfate  220 mg Oral Daily     Dialysis Orders: [facility] MWF 4h 38mn  400/800   108.5kg   2/2 bath  LFA AVF   - No Heparin  - rocaltrol 1.25 mcg po tiw - mircera 225 mcg IV  q 2wks, last 2/12, due 2/26 - last Hb 8.6 on 2/14   Assessment/Plan: 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 antibiotics per primary.  Hyponatremia - worsening in the setting of hypervolemia. managing with HD/UF. Fluid restriction reinforced ESRD - on HD MWF. Next HD 06/23/2022 HTN/ volume - Last wt this AM at OP EDW with hyponatremia. Should be lower post BKA. Continue lowering volume at tolerated.  Decreased irbesartan to 75 mg PO daily  to allow for more volume to be removed. Glycemic control plays role in high gains. UF as tolerated and optimize volume with HD. BP acceptable Anemia esrd -  Hgb 9.1. s/p 1 unit prbcs 2/22. Last ESA q.Mondays. Check iron panel when he completes ABX.  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 Cont po vdra and velphoro as binder.  DM2 per pmd. Chronic hyperglycemia, poorly controlled DMT2.  Nutrition - hypoalbuminemia- Continue protein supplement. Fluid restriction   RJimmye Norman Dimond Crotty NP-C 06/21/2022, 2:29 PM  [phone]

## 2022-06-21 NOTE — Progress Notes (Addendum)
Received patient in bed ,awake ,alert and oriented x 4.  Access used: left lower arm fistula that worked well.  Duration of treatment : 4 hours.  Medicine given : None  Fluid removed :4,000 CC,met prescribed UF goal  Hemodialysis tx issue/comment:He tolerated his HD very well  Hand off to the patient's nurse.Made aware to the nurse to watch patient fistula,its hard to stop the bleeding on venous access.Changed the bed pad and there is blood stained on bed sheet under the  1.5 inch x 1 inch  diameter.

## 2022-06-21 NOTE — Progress Notes (Signed)
Occupational Therapy Session Note  Patient Details  Name: Scott Castillo MRN: [id number] Date of Birth: 1960/08/10  Session 1 Today's Date: 06/21/2022 OT Individual Time: [id number] Individual Time Calculation (min): 73 min   Session 2 Today's Date: 06/21/2022 OT Individual Time: 1103-1200 OT Individual Time Calculation (min): 57 min    Short Term Goals: Week 1:  OT Short Term Goal 1 (Week 1): Pt will stand from EOB with mod A OT Short Term Goal 2 (Week 1): Pt will don pants sit <> stand with mod A using LRAD OT Short Term Goal 3 (Week 1): Pt will complete stand pivot transfer with mod A  Skilled Therapeutic Interventions/Progress Updates:    Session 1 Pt received supine with 2/10 pain- no request for intervention, agreeable to OT session. L limb protector on and wound vac running.He came to EOB with mod I using bed features. He completed a lateral scoot- squat pivot transfer with CGA to the w/c, assist required for w/c stabilization. He completed UB ADLs at the sink with set up assist. LB ADLs completed sit <> stands using the sink for UE support with mod A. L lateral lean with LOB, requiring mod A to recover. He required max A to don/doff pants sit <> stands. Blocking of the R knee required to prevent excessive forward weight translation. Pt propelled the wc to the therapy gym to address wc mobility and UE endurance needed to navigate community. No rest breaks needed during 150 ft but extended one following. Great improvement in endurance. Observed pt to be bleeding from his L hand- around [facility] of thumb- chronic skin tear. Cleansed area with saline and applied foam dressing. Pt's limb guard is broken- bottom piece disconnected from mold. He came into the parallel bars and completed sit <> stand with focus on attempting to hop forward with his RLE to increase ability to complete functional stand pivot transfer. Heavy mod A for trunk support and pt very fatigued following. Second trial he was  able to fully hop forward x2 ! Extended rest break required following each trial dt fatigue. Pt returned to his room. Pt was left sitting up in the w/c with all needs met, wound vac plugged in, and call bell within reach.    Session 2 Pt received sitting up in the w/c with no c/o pain. Skin breakdown noted on medial thigh from limb guard. Limb guard is a size small- not appropriate for pt. Will call orthotic rep to get a different size. LPN made aware of skin breakdown and foam bandage placed. He propelled the w/c 100 ft to the therapy gym without rest, (S). He completed a slideboard transfer to the mat with CGA. Extra time provided to allow pt to problem solve through set up of transfer. Pt transitioned into prone on the mat. This was very difficult for pt to tolerate- he found lung expansion/breathing to be difficult, as well as BUE numbness setting in quickly. He was able to remain prone for about 4 minutes before coming into sidelying. In sidelying he completed LLE glute extension to assist with glute strengthening and positioning in standing. He took several breaks in long sitting. He came into supine and completed 3x8 glute bridges for posterior chain strengthening for carryover to ADL transfers. He completed UE strengthening circuit sitting EOM with 4 lb dumbbells- completing functional reaching up and forward, as well as bicep curls. 3x10 repetitions. Limb loss education provided throughout session. Pt returned to his w/c via slideboard transfer. He  was left supine with all needs met. Wound vac plugged in.    Therapy Documentation Precautions:  Precautions Precautions: Fall Precaution Comments: L BKA Other Brace: L limb protector Restrictions Weight Bearing Restrictions: No LLE Weight Bearing: Non weight bearing  Therapy/Group: Individual Therapy  Curtis Sites 06/21/2022, 6:04 AM

## 2022-06-21 NOTE — Progress Notes (Signed)
Physical Therapy Session Note  Patient Details  Name: Scott Castillo MRN: [id number] Date of Birth: 25-Dec-1960  Today's Date: 06/21/2022 PT Individual Time: 0917-1017 PT Individual Time Calculation (min): 60 min   Short Term Goals: Week 1:  PT Short Term Goal 1 (Week 1): Patient will performed sit/stand with LRAD and ModA PT Short Term Goal 2 (Week 1): Patient will performed stand pivot transfer with LRAD and Mod/MaxA x1 PT Short Term Goal 3 (Week 1): Patient will initiate gait training  Skilled Therapeutic Interventions/Progress Updates:    Patient received in [name] and denies pain in residual limb and Rt foot this date. Pt requesting to transfer to bed pan or BSC. Pt attempted squat pivot WC>BSC but unable to safely perform without or with slide board. Squat pivot transfer WC>EOB completed without slide board with CGA. Min assist for slideboard transfer bed>BSC for positioning board and blocking Rt LE for safety. Pt had continent void of bowels and attempted sit<>stand with RW form BSC, unable to safely rise from low height of BSC with RW, pt completed slideboard transfer back to bed (board covered with bed pad) and rolled Lt for pericare to clean. Patient completed squat pivot transfer bed>WC with CGA to stabilize WC for safety.   Pt self propelled to gym with cues for management of WC brakes and leg rests and supervision.   Pt positioned WC in front of UBE and completed for 4 minutes on level 4 for UE strenghtening with 2x1 minute forward/2x1 minute backward rotations. Seated LE strengthening completed in [name] for time management. Performed on Rt LE: 2x10 LAQ with 7lbs, 2x10 seated march Rt LE with 5 lbs.  Pt able to self propel back to room in [name] with supervision. Cues needed throughout session for management of WC features(brakes, leg rests, and armrests. EOS pt resting in [name] with chair with call bell and all needs within reach.  Therapy Documentation Precautions:  Precautions Precautions:  Fall Precaution Comments: L BKA Other Brace: L limb protector Restrictions Weight Bearing Restrictions: No LLE Weight Bearing: Non weight bearing     Therapy/Group: Individual Therapy   Verner Mould, DPT Acute Rehabilitation Services Office [phone]  06/21/22 4:14 PM

## 2022-06-21 NOTE — Progress Notes (Signed)
Wound vac removed from left BKA, no complications noted. Staples intact, dry dressing applied and shrinker in place. Scott Stack, LPN

## 2022-06-21 NOTE — Progress Notes (Signed)
PROGRESS NOTE   Subjective/Complaints:  Continues to have cough. Asks if wound vac can come off. No new complaints or concerns.   Review of Systems  Constitutional:  Negative for chills, fever and malaise/fatigue.  HENT:  Positive for congestion.   Respiratory:  Positive for cough. Negative for sputum production and shortness of breath.   Cardiovascular:  Negative for chest pain and palpitations.  Gastrointestinal:  Negative for abdominal pain, nausea and vomiting.  Musculoskeletal:  Positive for joint pain.  Skin:  Negative for rash.  Neurological:  Positive for weakness. Negative for headaches.    Objective:   No results found.    Recent Labs    06/18/22 1524  WBC 18.1*  HGB 9.3*  HCT 30.4*  PLT 280    Recent Labs    06/18/22 1524  NA 130*  K 5.2*  CL 94*  CO2 24  GLUCOSE 169*  BUN 72*  CREATININE 7.58*  CALCIUM 9.6     Intake/Output Summary (Last 24 hours) at 06/21/2022 0824 Last data filed at 06/21/2022 0801 Gross per 24 hour  Intake 840 ml  Output 1 ml  Net 839 ml         Physical Exam: Vital Signs Blood pressure (!) 140/80, pulse 75, temperature 98.1 F (36.7 C), temperature source Oral, resp. rate 18, height 6' (1.829 m), weight 108.5 kg, SpO2 92 %.    General: No acute distress Mood and affect are appropriate Heart: Regular rate and rhythm no rubs murmurs or extra sounds Lungs: Clear to auscultation, breathing unlabored, no rales or wheezes Abdomen: Positive bowel sounds, soft nontender to palpation, nondistended Extremities: L BKA   Psych: Pt's affect is a little flat Skin: Small scabs on right great toe. Stasis changes with papery skin right shin and healed abrasions. Limb guard with wound VAC on L-BKA- no drainage in vac container Venous stasis changes on RLE- with a few papery scabs- no drainage- fistula on L Forearm Neuro:    Alert and awake, no CN deficits noted    Comments:  Decreased to light touch from R mid shin downwards; Musculoskeletal:  UE strength 5/5 in Biceps, triceps, WE, grip and FA B/L RLE- 5/5 in HF, KE, KF- but moderate foot drop- DF 2-/5; and PF 2-/5 LLE- HF/KE/KF 5-/5- after limb guard removed   Assessment/Plan: 1. Functional deficits which require 3+ hours per day of interdisciplinary therapy in a comprehensive inpatient rehab setting. Physiatrist is providing close team supervision and 24 hour management of active medical problems listed below. Physiatrist and rehab team continue to assess barriers to discharge/monitor patient progress toward functional and medical goals  Care Tool:  Bathing    Body parts bathed by patient: Right arm, Left arm, Abdomen, Chest, Front perineal area, Right upper leg, Left upper leg, Face   Body parts bathed by helper: Buttocks, Right lower leg Body parts n/a: Left lower leg (BKA)   Bathing assist Assist Level: Moderate Assistance - Patient 50 - 74%     Upper Body Dressing/Undressing Upper body dressing   What is the patient wearing?: Pull over shirt    Upper body assist Assist Level: Set up assist    Lower  Body Dressing/Undressing Lower body dressing      What is the patient wearing?: Pants, Incontinence brief     Lower body assist Assist for lower body dressing: Maximal Assistance - Patient 25 - 49%     Toileting Toileting    Toileting assist Assist for toileting: Maximal Assistance - Patient 25 - 49%     Transfers Chair/bed transfer  Transfers assist     Chair/bed transfer assist level: Minimal Assistance - Patient > 75% (slideboard)     Locomotion Ambulation   Ambulation assist   Ambulation activity did not occur: Safety/medical concerns (Unable to ambulated at this time secondary to new BKA with global deconditioning)          Walk 10 feet activity   Assist  Walk 10 feet activity did not occur: Safety/medical concerns        Walk 50 feet activity   Assist  Walk 50 feet with 2 turns activity did not occur: Safety/medical concerns         Walk 150 feet activity   Assist Walk 150 feet activity did not occur: Safety/medical concerns         Walk 10 feet on uneven surface  activity   Assist Walk 10 feet on uneven surfaces activity did not occur: Safety/medical concerns         Wheelchair     Assist Is the patient using a wheelchair?: Yes Type of Wheelchair: Manual    Wheelchair assist level: Supervision/Verbal cueing Max wheelchair distance: 90    Wheelchair 50 feet with 2 turns activity    Assist        Assist Level: Supervision/Verbal cueing   Wheelchair 150 feet activity     Assist      Assist Level: Supervision/Verbal cueing   Blood pressure (!) 140/80, pulse 75, temperature 98.1 F (36.7 C), temperature source Oral, resp. rate 18, height 6' (1.829 m), weight 108.5 kg, SpO2 92 %.  Medical Problem List and Plan: 1. 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 Sharol Given regarding wound vac removal- OK to remove vac- stump shrinker and dry dressing  2.  Antithrombotics: -DVT/anticoagulation:  Pharmaceutical: Heparin             -antiplatelet therapy: NA 3. Pain Management: Oxycodone prn for pain. No phantom pain so far 4. Mood/Behavior/Sleep: LCSW to follow for evaluation and support.              -antipsychotic agents: N/A 5. Neuropsych/cognition: This patient is capable of making decisions on his own behalf. 6. Skin/Wound Care: Routine pressure relief measures.  7. Fluids/Electrolytes/Nutrition: Strict I/O. Daily weights. Enforce 1200 cc FR.             --Reports on multiple supplements--d/c nephro.  --Continue Juven bid, Zinc and Vitamin-C to promote wound healing.  -Hyponatremia- improved on labs 3/1 to 130 8. Left calf  staph positive cultures: IV antibiotics changed to Augmentin X 19 days per recs.  (stop 07/03/22) 9. HTN: Monitor BP TID--continue Avapro (reduced to '75mg'$  2/27) and Demadex -STTS.   -3/4 stable continue current regimen     06/21/2022    4:35 AM 06/21/2022    4:33 AM 06/20/2022    7:06  PM  Vitals with BMI  Weight 239 lbs 3 oz    BMI [id number]    Systolic  [id number]  Diastolic  80 85  Pulse  75 90    10. T2DM: Hgb A1C- 7.4. Was on Glimepiride and Rubelsus PTA.  -- Monitor BS ac/hs and use SSI for tighter control.         -2/28 glargine increase to 8 units yesterday, SSI changed to sensitive. CBGs elevated, Monitor        -continue Insulin glargine w/ 2 units novolog TID AC ---> increase to 3u TID (3/1)  -3/4 Increase glargine to 12 units from 10 units CBG (last 3)  Recent Labs    06/20/22 1626 06/20/22 2105 06/21/22 0603  GLUCAP 190* 234* 240*   Increase glargine for am CBG elevation  11. ESRD: Volume being managed with HD MWF. Is basically anuric- voided x2 since admission. Via fistula --Will schedule HD at the end of the day to help with tolerance of therapy.  12.  Leucocytosis due to LLE osteomyelitis- : Monitor for fevers and other signs of infection.              --WBC resolving 16.4-->22.2-->16.7--> 18.5 2/29, monitor for signs of infection 13. Hyponatremia: Being managed with HD.  14. Cough: SOB with cough noted during exam-->he reports at baseline X 3 weeks and it due to dry air?             --? Due to fluid overload. Will order throat spray and CXR.              --encourage pulmonary hygiene.   -2/28 CXR with atelectasis vs PNA, continue augmentin as above, start mucinex, continue incentive spirometer  -3/1 resp panel negative, cough a little improved 15. Right hip pain: Fall 3 weeks PTA. Will order Voltaren gel TID.  16. Loose stools- due to IV ABX per pt- might need a stool bulker/fiber-   -3/1 Fibercon started '625mg'$  BID      LOS: 6 days A FACE TO FACE EVALUATION WAS  PERFORMED  Jennye Boroughs 06/21/2022, 8:24 AM

## 2022-06-21 NOTE — Progress Notes (Signed)
New skin breakdown noted on pt left inner upper thigh. Abrasion noted from pt limb guard. Pt reports limb guard has been rubbing the site. New limb guard ordered from [facility]anger. Foam applied. Site clean dry and not draining. Sheela Stack, LPN

## 2022-06-22 LAB — GLUCOSE, CAPILLARY
Glucose-Capillary: 222 mg/dL — ABNORMAL HIGH (ref 70–99)
Glucose-Capillary: 129 mg/dL — ABNORMAL HIGH (ref 70–99)
Glucose-Capillary: 172 mg/dL — ABNORMAL HIGH (ref 70–99)
Glucose-Capillary: 206 mg/dL — ABNORMAL HIGH (ref 70–99)
Glucose-Capillary: 195 mg/dL — ABNORMAL HIGH (ref 70–99)

## 2022-06-22 MED ORDER — PROSOURCE PLUS PO LIQD
30.0000 mL | Freq: Two times a day (BID) | ORAL | Status: DC
  Administered 2022-06-23 – 2022-07-08 (×20): 30 mL via ORAL
  Filled 2022-06-22 (×20): qty 30

## 2022-06-22 MED ORDER — DARBEPOETIN ALFA 200 MCG/0.4ML IJ SOSY
200.0000 ug | PREFILLED_SYRINGE | INTRAMUSCULAR | Status: DC
  Administered 2022-06-22 – 2022-07-06 (×3): 200 ug via SUBCUTANEOUS
  Filled 2022-06-22 (×4): qty 0.4

## 2022-06-22 NOTE — Progress Notes (Signed)
[facility] Progress Note   Subjective: seen in room with wife and daughter present. He reports prolonged bleeding from AVF last PM. If this happens again will order F'gram. HD tomorrow on schedule.      Objective Vitals:   06/21/22 2034 06/22/22 0355 06/22/22 0421 06/22/22 0833  BP: (!) 141/83 137/87  135/69  Pulse: 96 80  72  Resp: 18 18    Temp: 97.7 F (36.5 C) 98.5 F (36.9 C)    TempSrc: Oral Oral    SpO2: 96% 97%    Weight:   107.1 kg   Height:       Physical Exam General: chronically ill appearing male in NAD Heart: S1,S2 No M/R/G Lungs: CTAB. No WOB.  Abdomen: NT, NABS Extremities: L BKA stump protector in place. chronic stasis changes RLE with trace edema Dialysis Access: L AVF +T/B    Additional Objective Labs: Basic Metabolic Panel: Recent Labs  Lab 06/16/22 1451 06/18/22 1524 06/21/22 1253  NA 122* 130* 131*  K 4.6 5.2* 4.9  CL 90* 94* 95*  CO2 20* 24 22  GLUCOSE 214* 169* 194*  BUN 86* 72* 85*  CREATININE 7.66* 7.58* 9.21*  CALCIUM 9.0 9.6 9.5  PHOS 4.3 4.5 4.9*   Liver Function Tests: Recent Labs  Lab 06/16/22 1451 06/18/22 1524 06/21/22 1253  ALBUMIN 1.8* 2.0* 2.1*   No results for input(s): "LIPASE", "AMYLASE" in the last 168 hours. CBC: Recent Labs  Lab 06/16/22 1451 06/18/22 1524 06/21/22 1253  WBC 18.5* 18.1* 15.8*  HGB 8.8* 9.3* 9.1*  HCT 27.6* 30.4* 29.6*  MCV 87.1 88.9 89.2  PLT 288 280 252   Blood Culture    Component Value Date/Time   SDES TISSUE 06/11/2022 1449   SPECREQUEST LEFT BKA 06/11/2022 1449   CULT  06/11/2022 1449    RARE STAPHYLOCOCCUS AUREUS NO ANAEROBES ISOLATED Performed at [facility], [place] [address], [place], [place] [address]    REPTSTATUS 06/17/2022 FINAL 06/11/2022 1449    Cardiac Enzymes: No results for input(s): "CKTOTAL", "CKMB", "CKMBINDEX", "TROPONINI" in the last 168 hours. CBG: Recent Labs  Lab 06/21/22 0603 06/21/22 1204 06/21/22 2032 06/22/22 0559  06/22/22 1143  GLUCAP 240* 171* 186* 222* 129*   Iron Studies: No results for input(s): "IRON", "TIBC", "TRANSFERRIN", "FERRITIN" in the last 72 hours. '@lablastinr3'$ @ Studies/Results: No results found. Medications:   amoxicillin-clavulanate  1 tablet Oral q1800   vitamin C  1,000 mg Oral Daily   atorvastatin  40 mg Oral Daily   calcitRIOL  1.25 mcg Oral Q M,W,F-HD   carvedilol  25 mg Oral BID WC   darbepoetin (ARANESP) injection - DIALYSIS  200 mcg Subcutaneous Q Tue-1800   diclofenac Sodium  2 g Topical TID AC   Gerhardt's butt cream   Topical BID   guaiFENesin  600 mg Oral BID   heparin  5,000 Units Subcutaneous Q8H   insulin aspart  0-5 Units Subcutaneous QHS   insulin aspart  0-9 Units Subcutaneous TID with meals   insulin aspart  3 Units Subcutaneous TID WC   insulin glargine-yfgn  12 Units Subcutaneous Daily   irbesartan  75 mg Oral Daily   lactobacillus  1 g Oral TID WC   multivitamin  1 tablet Oral QHS   nutrition supplement (JUVEN)  1 packet Oral BID BM   pantoprazole  40 mg Oral Daily   polycarbophil  625 mg Oral BID   sucroferric oxyhydroxide  1,000 mg Oral TID WC  torsemide  100 mg Oral Once per day on Sun Tue Thu Sat   zinc sulfate  220 mg Oral Daily     Dialysis Orders: [facility] MWF 4h 68mn  400/800   108.5kg   2/2 bath  LFA AVF   - No Heparin  - rocaltrol 1.25 mcg po tiw - mircera 225 mcg IV  q 2wks, last 2/12, due 2/26 - last Hb 8.6 on 2/14   Assessment/Plan: 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 antibiotics per primary.  Hyponatremia - worsening in the setting of hypervolemia. managing with HD/UF. Fluid restriction reinforced ESRD - on HD MWF. Next HD 06/23/2022 HTN/ volume - Last wt this AM at OP EDW with hyponatremia. Wt should be lower post BKA. Continue lowering volume at tolerated.  Decreased irbesartan to 75 mg PO daily  to allow for more volume to be removed. Glycemic control plays role in high gains. UF as tolerated and optimize volume with  HD. 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 s/p 1 unit prbcs 2/22. Last ESA q.Mondays. Check iron panel when he completes ABX.  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 Cont po vdra and velphoro as binder.  DM2 per pmd. Chronic hyperglycemia, poorly controlled DMT2.  Nutrition - hypoalbuminemia- Continue protein supplement. Fluid restriction   RJimmye Norman Leontina Skidmore NP-C 06/22/2022, 12:50 PM  [phone]

## 2022-06-22 NOTE — Progress Notes (Signed)
Occupational Therapy Session Note  Patient Details  Name: Scott Castillo MRN: [id number] Date of Birth: 02-13-1961  Today's Date: 06/22/2022 OT Individual Time: 0730-0830 OT Individual Time Calculation (min): 60 min    Short Term Goals: Week 1:  OT Short Term Goal 1 (Week 1): Pt will stand from EOB with mod A OT Short Term Goal 2 (Week 1): Pt will don pants sit <> stand with mod A using LRAD OT Short Term Goal 3 (Week 1): Pt will complete stand pivot transfer with mod A  Skilled Therapeutic Interventions/Progress Updates:    Pt received supine with no c/o pain, agreeable to OT session. Wound vac removed. Pt agreeable to shower this session. Occluded residual limb, R peripheral IV. Provided edu on shrinker management and continuation of desensitization techniques. Found 2nd shrinker and following shower changed it with pt. Pt completed squat pivot/lateral scoot bed > w/c> TTB with CGA, support required on w.c and cueing required for clearing wheel. He required min A to doff LB clothing. He completed all bathing seated with instruction for lateral leans with close (S). He returned to the w/c following with CGA. He was provided demo on Consulting civil engineer. He donned a shirt with (S). Pants with mod cueing and mod A sit <> stand at the sink. He stood several times at the sink with mod A overall, heavy reliance on stability of the sink.  Pt was left sitting up in the w/c with all needs met and call bell within reach.    Therapy Documentation Precautions:  Precautions Precautions: Fall Precaution Comments: L BKA Other Brace: L limb protector Restrictions Weight Bearing Restrictions: (P) No LLE Weight Bearing: (P) Non weight bearing  Therapy/Group: Individual Therapy  Curtis Sites 06/22/2022, 6:06 AM

## 2022-06-22 NOTE — Progress Notes (Signed)
PROGRESS NOTE   Subjective/Complaints:  Had bleeding from AVF last night, this has stopped.  Robitussin/Mucinex helping cough.  Denies pain.   Review of Systems  Constitutional:  Negative for chills and fever.  HENT:  Positive for congestion.   Respiratory:  Positive for cough. Negative for sputum production and shortness of breath.   Cardiovascular:  Negative for chest pain.  Gastrointestinal:  Negative for abdominal pain, nausea and vomiting.  Musculoskeletal:  Negative for joint pain.  Neurological:  Positive for weakness. Negative for dizziness and headaches.    Objective:   No results found.    Recent Labs    06/21/22 1253  WBC 15.8*  HGB 9.1*  HCT 29.6*  PLT 252    Recent Labs    06/21/22 1253  NA 131*  K 4.9  CL 95*  CO2 22  GLUCOSE 194*  BUN 85*  CREATININE 9.21*  CALCIUM 9.5     Intake/Output Summary (Last 24 hours) at 06/22/2022 0831 Last data filed at 06/21/2022 1906 Gross per 24 hour  Intake 360 ml  Output 4001 ml  Net -3641 ml         Physical Exam: Vital Signs Blood pressure 137/87, pulse 80, temperature 98.5 F (36.9 C), temperature source Oral, resp. rate 18, height 6' (1.829 m), weight 107.1 kg, SpO2 97 %.    General: No acute distress Mood and affect are appropriate Heart: Regular rate and rhythm no rubs murmurs or extra sounds Lungs: Clear to auscultation, breathing unlabored, no rales or wheezes Abdomen: Positive bowel sounds, soft nontender to palpation, nondistended Extremities: L BKA L AVF   Psych: Pt's affect is a little flat Skin: Small scabs on right great toe. Stasis changes with papery skin right shin and healed abrasions. Limb guard - VAC has been removed Venous stasis changes on RLE Neuro:    Alert and awake, CN 2-12 grossly intact, follows commands    Comments: Decreased to light touch from R mid shin downwards;  Musculoskeletal:  UE strength 5/5 in  Biceps, triceps, WE, grip and FA B/L RLE- 5/5 in HF, KE, KF- but moderate foot drop- DF 2-/5; and PF 2-/5 LLE- HF/KE/KF 5-/5- after limb guard removed   Assessment/Plan: 1. Functional deficits which require 3+ hours per day of interdisciplinary therapy in a comprehensive inpatient rehab setting. Physiatrist is providing close team supervision and 24 hour management of active medical problems listed below. Physiatrist and rehab team continue to assess barriers to discharge/monitor patient progress toward functional and medical goals  Care Tool:  Bathing    Body parts bathed by patient: Right arm, Left arm, Abdomen, Chest, Front perineal area, Right upper leg, Left upper leg, Face, Buttocks, Right lower leg   Body parts bathed by helper: Buttocks, Right lower leg Body parts n/a: Left lower leg   Bathing assist Assist Level: Contact Guard/Touching assist (shower level seated)     Upper Body Dressing/Undressing Upper body dressing   What is the patient wearing?: Pull over shirt    Upper body assist Assist Level: Set up assist    Lower Body Dressing/Undressing Lower body dressing      What is the patient wearing?: Pants, Incontinence  brief     Lower body assist Assist for lower body dressing: Moderate Assistance - Patient 50 - 74%     Toileting Toileting    Toileting assist Assist for toileting: Moderate Assistance - Patient 50 - 74%     Transfers Chair/bed transfer  Transfers assist     Chair/bed transfer assist level: Contact Guard/Touching assist (squat pivot)     Locomotion Ambulation   Ambulation assist   Ambulation activity did not occur: Safety/medical concerns (Unable to ambulated at this time secondary to new BKA with global deconditioning)          Walk 10 feet activity   Assist  Walk 10 feet activity did not occur: Safety/medical concerns        Walk 50 feet activity   Assist Walk 50 feet with 2 turns activity did not occur:  Safety/medical concerns         Walk 150 feet activity   Assist Walk 150 feet activity did not occur: Safety/medical concerns         Walk 10 feet on uneven surface  activity   Assist Walk 10 feet on uneven surfaces activity did not occur: Safety/medical concerns         Wheelchair     Assist Is the patient using a wheelchair?: Yes Type of Wheelchair: Manual    Wheelchair assist level: Supervision/Verbal cueing Max wheelchair distance: 150    Wheelchair 50 feet with 2 turns activity    Assist        Assist Level: Supervision/Verbal cueing   Wheelchair 150 feet activity     Assist      Assist Level: Supervision/Verbal cueing   Blood pressure 137/87, pulse 80, temperature 98.5 F (36.9 C), temperature source Oral, resp. rate 18, height 6' (1.829 m), weight 107.1 kg, SpO2 97 %.  Medical Problem List and Plan: 1. 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 Sharol Given regarding wound vac removal- OK to remove vac- stump shrinker and dry dressing   -wound vac was removed 3/4  -Team conference tomorrow 2.  Antithrombotics: -DVT/anticoagulation:  Pharmaceutical: Heparin             -antiplatelet therapy: NA 3. Pain Management: Oxycodone prn for pain. No phantom pain so far 4. Mood/Behavior/Sleep: LCSW to follow for evaluation and support.              -antipsychotic agents: N/A 5. Neuropsych/cognition: This patient is capable of making decisions on his own behalf. 6. Skin/Wound Care: Routine pressure relief measures.  7. Fluids/Electrolytes/Nutrition: Strict I/O. Daily weights. Enforce 1200 cc FR.             --Reports on multiple supplements--d/c nephro.  --Continue Juven bid, Zinc and Vitamin-C to promote wound healing.  -Hyponatremia- improved on labs 3/4 to 131 8. Left  calf staph positive cultures: IV antibiotics changed to Augmentin X 19 days per recs.  (stop 07/03/22) 9. HTN: Monitor BP TID--continue Avapro (reduced to '75mg'$  2/27) and Demadex -STTS.   -3/5 controlled BP, continue to monitor     06/22/2022    4:21 AM 06/22/2022    3:55 AM 06/21/2022    8:34 PM  Vitals with BMI  Weight 236 lbs  2 oz    BMI [id number]    Systolic  0000000 [id number]  Diastolic  87 83  Pulse  80 96    10. T2DM: Hgb A1C- 7.4. Was on Glimepiride and Rubelsus PTA.  -- Monitor BS ac/hs and use SSI for tighter control.         -2/28 glargine increase to 8 units yesterday, SSI changed to sensitive. CBGs elevated, Monitor        -continue Insulin glargine w/ 2 units novolog TID AC ---> increase to 3u TID (3/1)  -3/4 Increase glargine to 12 units from 10 units  -3/5 monitor for response to change in glargine CBG (last 3)  Recent Labs    06/21/22 2032 06/22/22 0559 06/22/22 1143  GLUCAP 186* 222* 129*    11. ESRD: Volume being managed with HD MWF. Is basically anuric- voided x2 since admission. Via fistula --Will schedule HD at the end of the day to help with tolerance of therapy.  12.  Leucocytosis due to LLE osteomyelitis- : Monitor for fevers and other signs of infection.              --WBC resolving 16.4-->22.2-->16.7--> 18.5 2/29, monitor for signs of infection 13. Hyponatremia: Being managed with HD.  14. Cough: SOB with cough noted during exam-->he reports at baseline X 3 weeks and it due to dry air?             --? Due to fluid overload. Will order throat spray and CXR.              --encourage pulmonary hygiene.   -2/28 CXR with atelectasis vs PNA, continue augmentin as above, start mucinex, continue incentive spirometer  -3/1 resp panel negative, cough a little improved 15. Right hip pain: Fall 3 weeks PTA. Will order Voltaren gel TID.  16. Loose stools- due to IV ABX per pt- might need a stool bulker/fiber-   -3/1 Fibercon started '625mg'$  BID      LOS: 7 days A FACE TO FACE  EVALUATION WAS PERFORMED  Jennye Boroughs 06/22/2022, 8:31 AM

## 2022-06-22 NOTE — Progress Notes (Signed)
Occupational Therapy Session Note  Patient Details  Name: Scott Castillo MRN: [id number] Date of Birth: 1960/10/28  Today's Date: 06/22/2022 OT Individual Time: 1306-1403 OT Individual Time Calculation (min): 57 min    Short Term Goals: Week 1:  OT Short Term Goal 1 (Week 1): Pt will stand from EOB with mod A OT Short Term Goal 2 (Week 1): Pt will don pants sit <> stand with mod A using LRAD OT Short Term Goal 3 (Week 1): Pt will complete stand pivot transfer with mod A  Skilled Therapeutic Interventions/Progress Updates:  Pt greeted supine in bed, pt agreeable to OT intervention. Pt completed supine>sit with CGA, with pt able to squat pivot to w/c with CGA. Pt completed w/c propulsion to gym with supervision with one rest break at half way point. Pt completed additional squat pivot to EOM with CGA.   Pt completed seated tricep dips with elevated handles from EOM for 2x10 reps to facilitate improved UB strength/endurance for transfer training. Worked on sit>stands in stedy with pt able to stand from [facility] with MIN A +2 for safety, pt completed x2 reps and able to reach dynamically with BUEs with only CGA in standing.   During session ortho tech delivered medium limb guard and donned during session for proper fit. Remainder of session focused on seated BUE therex with level 3 theraband to facilitate improved BUE strength/endurance:   X10 shoulder flexion  X10 bicep curls X10 shoulder horizontal ABD X10 shoulder extension  X10 alternating punches  Issued pt written HEP to increase carryover                  Ended session with pt supine in bed with all needs within reach and bed alarm activated.                    Therapy Documentation Precautions:  Precautions Precautions: Fall Precaution Comments: L BKA Other Brace: L limb protector Restrictions Weight Bearing Restrictions: No LLE Weight Bearing: Non weight bearing  Pain: no pain reported during session     Therapy/Group:  Individual Therapy  Precious Haws 06/22/2022, 3:37 PM

## 2022-06-22 NOTE — Progress Notes (Signed)
Physical Therapy Session Note  Patient Details  Name: Scott Castillo MRN: [id number] Date of Birth: December 30, 1960  Today's Date: 06/22/2022 PT Individual Time: 0950-1107 PT Individual Time Calculation (min): 77 min   Short Term Goals: Week 1:  PT Short Term Goal 1 (Week 1): Patient will performed sit/stand with LRAD and ModA PT Short Term Goal 2 (Week 1): Patient will performed stand pivot transfer with LRAD and Mod/MaxA x1 PT Short Term Goal 3 (Week 1): Patient will initiate gait training  Skilled Therapeutic Interventions/Progress Updates: Pt presented in w/c agreeable to therapy. Pt states some mild pain in residual but states has felt better since took shower this am. Pt transported to main gym for energy conservation. Pt instructed and was able to perform w/c management with minA for unlocking leg rests. Pt was able to perform lateral scoot transfer to mat with CGA. Pt then participated in core strengthening activities including weighted 2lb ball with rebounder 3 x 10, ab twists, and chops each with 2lb weighted ball in each direction. Pt also performed modified sit ups from wedge with 2lb weighted ball 2 x 10. Pt then transferred to supine and performed SLR 3 x 10 on LLE and with 3ln ankle cuff on RLE. Transitioned to sidelying and performed hip abd on residual limb 3 x 10 with PTA providing tactile cues to minimize hip flexion in sidelying. Pt also performed hip extension in sidelying 2 x 10 as pt stated unable to tolerate prone. Discussed importance of maintaining hip flexion length to optimize function once ambulatory. Pt returned to sitting with supervision and performed LAQ 2 x 10 residual limb 2 x 10. Pt then performed lateral scoot transfer but with emphasis on pushing through RLE and lifting hip off mat. Pt was able to clear mat and w/c cushion with light minA successfully. Pt then propelled back to room requiring x 3 brief rest breaks due to fatigue. In room PTA removed leg rests and pt  performed lateral scoot transfer to bed with light minA in similar manner as prior. Pt transferred to supine with supervision and left in bed at end of session with bed alarm on, call bell within reach and need met with wife and dgt present.      Therapy Documentation Precautions:  Precautions Precautions: Fall Precaution Comments: L BKA Other Brace: L limb protector Restrictions Weight Bearing Restrictions: No LLE Weight Bearing: Non weight bearing   Therapy/Group: Individual Therapy  Ernesteen Mihalic 06/22/2022, 12:38 PM

## 2022-06-23 DIAGNOSIS — M79651 Pain in right thigh: Secondary | ICD-10-CM

## 2022-06-23 LAB — GLUCOSE, CAPILLARY
Glucose-Capillary: 138 mg/dL — ABNORMAL HIGH (ref 70–99)
Glucose-Capillary: 197 mg/dL — ABNORMAL HIGH (ref 70–99)
Glucose-Capillary: 110 mg/dL — ABNORMAL HIGH (ref 70–99)

## 2022-06-23 LAB — CBC
HCT: 27.3 % — ABNORMAL LOW (ref 39.0–52.0)
Hemoglobin: 8.7 g/dL — ABNORMAL LOW (ref 13.0–17.0)
MCH: 28 pg (ref 26.0–34.0)
MCHC: 31.9 g/dL (ref 30.0–36.0)
MCV: 87.8 fL (ref 80.0–100.0)
Platelets: 257 10*3/uL (ref 150–400)
RBC: 3.11 MIL/uL — ABNORMAL LOW (ref 4.22–5.81)
RDW: 17.5 % — ABNORMAL HIGH (ref 11.5–15.5)
WBC: 13.2 10*3/uL — ABNORMAL HIGH (ref 4.0–10.5)
nRBC: 0 % (ref 0.0–0.2)

## 2022-06-23 LAB — RENAL FUNCTION PANEL
Albumin: 2.1 g/dL — ABNORMAL LOW (ref 3.5–5.0)
Anion gap: 14 (ref 5–15)
BUN: 77 mg/dL — ABNORMAL HIGH (ref 6–20)
CO2: 23 mmol/L (ref 22–32)
Calcium: 9.2 mg/dL (ref 8.9–10.3)
Chloride: 92 mmol/L — ABNORMAL LOW (ref 98–111)
Creatinine, Ser: 8.03 mg/dL — ABNORMAL HIGH (ref 0.61–1.24)
GFR, Estimated: 7 mL/min — ABNORMAL LOW (ref >60)
Glucose, Bld: 159 mg/dL — ABNORMAL HIGH (ref 70–99)
Phosphorus: 4.9 mg/dL — ABNORMAL HIGH (ref 2.5–4.6)
Potassium: 5.2 mmol/L — ABNORMAL HIGH (ref 3.5–5.1)
Sodium: 129 mmol/L — ABNORMAL LOW (ref 135–145)

## 2022-06-23 MED ORDER — MUSCLE RUB 10-15 % EX CREA
TOPICAL_CREAM | CUTANEOUS | Status: DC | PRN
  Filled 2022-06-23 (×2): qty 85

## 2022-06-23 MED ORDER — INSULIN ASPART 100 UNIT/ML IJ SOLN
4.0000 [IU] | Freq: Three times a day (TID) | INTRAMUSCULAR | Status: DC
  Administered 2022-06-24 – 2022-07-08 (×36): 4 [IU] via SUBCUTANEOUS

## 2022-06-23 NOTE — Progress Notes (Signed)
PROGRESS NOTE   Subjective/Complaints:  Reports some muscle soreness R thigh- this has been present for about a month.  Asked me to complete FMLA form.   Review of Systems  Constitutional:  Negative for chills and fever.  HENT:  Positive for congestion.   Respiratory:  Positive for cough. Negative for sputum production and shortness of breath.   Cardiovascular:  Negative for chest pain.  Gastrointestinal:  Negative for abdominal pain, nausea and vomiting.  Musculoskeletal:  Positive for myalgias. Negative for joint pain.  Neurological:  Positive for weakness. Negative for dizziness and headaches.    Objective:   No results found.    Recent Labs    06/21/22 1253  WBC 15.8*  HGB 9.1*  HCT 29.6*  PLT 252    Recent Labs    06/21/22 1253  NA 131*  K 4.9  CL 95*  CO2 22  GLUCOSE 194*  BUN 85*  CREATININE 9.21*  CALCIUM 9.5     Intake/Output Summary (Last 24 hours) at 06/23/2022 0841 Last data filed at 06/22/2022 2040 Gross per 24 hour  Intake 220 ml  Output 337 ml  Net -117 ml         Physical Exam: Vital Signs Blood pressure (!) 144/86, pulse 72, temperature 97.9 F (36.6 C), resp. rate 18, height 6' (1.829 m), weight 107.7 kg, SpO2 96 %.    General: No acute distress Mood and affect are appropriate Heart: Regular rate and rhythm no rubs murmurs or extra sounds Lungs: Clear to auscultation, breathing unlabored, no rales or wheezes. Cough sounds less frequent Abdomen: Positive bowel sounds, soft nontender to palpation, nondistended Extremities: L BKA L AVF   Psych: Pt's affect is a little flat Skin: Small scabs on right great toe. Stasis changes with papery skin right shin and healed abrasions. Limb guard - VAC has been removed Venous stasis changes on RLE Neuro:    Alert and awake, CN 2-12 grossly intact, follows commands    Comments: Decreased to light touch from R mid shin downwards;   Musculoskeletal:  UE strength 5/5 in Biceps, triceps, WE, grip and FA B/L RLE- 5/5 in HF, KE, KF- but moderate foot drop- DF 2-/5; and PF 2-/5 LLE- HF/KE/KF 5-/5- after limb guard removed Mild tenderness R thigh   Assessment/Plan: 1. Functional deficits which require 3+ hours per day of interdisciplinary therapy in a comprehensive inpatient rehab setting. Physiatrist is providing close team supervision and 24 hour management of active medical problems listed below. Physiatrist and rehab team continue to assess barriers to discharge/monitor patient progress toward functional and medical goals  Care Tool:  Bathing    Body parts bathed by patient: Right arm, Left arm, Abdomen, Chest, Front perineal area, Right upper leg, Left upper leg, Face, Buttocks, Right lower leg   Body parts bathed by helper: Buttocks, Right lower leg Body parts n/a: Left lower leg   Bathing assist Assist Level: Contact Guard/Touching assist (shower level seated)     Upper Body Dressing/Undressing Upper body dressing   What is the patient wearing?: Pull over shirt    Upper body assist Assist Level: Set up assist    Lower Body Dressing/Undressing Lower  body dressing      What is the patient wearing?: Pants, Incontinence brief     Lower body assist Assist for lower body dressing: Moderate Assistance - Patient 50 - 74%     Toileting Toileting    Toileting assist Assist for toileting: Moderate Assistance - Patient 50 - 74%     Transfers Chair/bed transfer  Transfers assist     Chair/bed transfer assist level: Contact Guard/Touching assist (squat pivot)     Locomotion Ambulation   Ambulation assist   Ambulation activity did not occur: Safety/medical concerns (Unable to ambulated at this time secondary to new BKA with global deconditioning)          Walk 10 feet activity   Assist  Walk 10 feet activity did not occur: Safety/medical concerns        Walk 50 feet  activity   Assist Walk 50 feet with 2 turns activity did not occur: Safety/medical concerns         Walk 150 feet activity   Assist Walk 150 feet activity did not occur: Safety/medical concerns         Walk 10 feet on uneven surface  activity   Assist Walk 10 feet on uneven surfaces activity did not occur: Safety/medical concerns         Wheelchair     Assist Is the patient using a wheelchair?: Yes Type of Wheelchair: Manual    Wheelchair assist level: Supervision/Verbal cueing Max wheelchair distance: 150    Wheelchair 50 feet with 2 turns activity    Assist        Assist Level: Supervision/Verbal cueing   Wheelchair 150 feet activity     Assist      Assist Level: Supervision/Verbal cueing   Blood pressure (!) 144/86, pulse 72, temperature 97.9 F (36.6 C), resp. rate 18, height 6' (1.829 m), weight 107.7 kg, SpO2 96 %.  Medical Problem List and Plan: 1. 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 Sharol Given regarding wound vac removal- OK to remove vac- stump shrinker and dry dressing   -wound vac was removed 3/4  -Team conference today please see physician documentation under team conference tab, met with team  to discuss problems,progress, and goals. Formulized individual treatment plan based on medical history, underlying problem and comorbidities.    2.  Antithrombotics: -DVT/anticoagulation:  Pharmaceutical: Heparin             -antiplatelet therapy: NA 3. Pain Management: Oxycodone prn for pain. No phantom pain so far  -muscle rub for R thigh started 4. Mood/Behavior/Sleep: LCSW to follow for evaluation and support.              -antipsychotic agents: N/A 5. Neuropsych/cognition: This patient is capable of making decisions on his own behalf. 6. Skin/Wound  Care: Routine pressure relief measures.  7. Fluids/Electrolytes/Nutrition: Strict I/O. Daily weights. Enforce 1200 cc FR.             --Reports on multiple supplements--d/c nephro.  --Continue Juven bid, Zinc and Vitamin-C to promote wound healing.  -Hyponatremia- improved on labs 3/4 to 131 8. Left calf staph positive cultures: IV antibiotics changed to Augmentin X 19 days per recs.  (stop 07/03/22)  9. HTN: Monitor BP TID--continue Avapro (reduced to '75mg'$  2/27) and Demadex -STTS.   -3/6 a little above goal, continue to monitor for now     06/23/2022    5:00 AM 06/23/2022    3:18 AM 06/22/2022    7:15 PM  Vitals with BMI  Weight 237 lbs 7 oz    BMI [id number]    Systolic  [id number]  Diastolic  86 74  Pulse  72 85    10. T2DM: Hgb A1C- 7.4. Was on Glimepiride and Rubelsus PTA.  -- Monitor BS ac/hs and use SSI for tighter control.         -2/28 glargine increase to 8 units yesterday, SSI changed to sensitive. CBGs elevated, Monitor        -continue Insulin glargine w/ 2 units novolog TID AC ---> increase to 3u TID (3/1)  -3/4 Increase glargine to 12 units from 10 units  -3/6 increase novolog to 4 units TID AC  CBG (last 3)  Recent Labs    06/22/22 2025 06/22/22 2101 06/23/22 0559  GLUCAP 206* 195* 138*     11. ESRD: Volume being managed with HD MWF. Is basically anuric- voided x2 since admission. Via fistula --Will schedule HD at the end of the day to help with tolerance of therapy.  12.  Leucocytosis due to LLE osteomyelitis- : Monitor for fevers and other signs of infection.              --WBC resolving 16.4-->22.2-->16.7--> 18.5 2/29, monitor for signs of infection 13. Hyponatremia: Being managed with HD.  14. Cough: SOB with cough noted during exam-->he reports at baseline X 3 weeks and it due to dry air?             --? Due to fluid overload. Will order throat spray and CXR.              --encourage pulmonary hygiene.   -2/28 CXR with atelectasis vs PNA, continue augmentin as  above, start mucinex, continue incentive spirometer  -3/1 resp panel negative, cough a little improved  Cough gradually improving 15. Right hip pain: Fall 3 weeks PTA. Will order Voltaren gel TID.  16. Loose stools- due to IV ABX per pt- might need a stool bulker/fiber-   -3/1 Fibercon started '625mg'$  BID      LOS: 8 days A FACE TO FACE EVALUATION WAS PERFORMED  Jennye Boroughs 06/23/2022, 8:41 AM

## 2022-06-23 NOTE — Progress Notes (Signed)
Occupational Therapy Weekly Progress Note  Patient Details  Name: Scott Castillo MRN: [id number] Date of Birth: 1961/02/15  Beginning of progress report period: June 16, 2022 End of progress report period: June 23, 2022  Today's Date: 06/23/2022 OT Individual Time: [id number] OT Individual Time Calculation (min): 74 min    Patient has met 2 of 3 short term goals.  Scott Castillo has made great progress toward his OT goals, progressing to (S) with UB ADLs, and mod  A with LB and toileting tasks. He is much more steady on the RW but is not as consistent with the stand pivot yet. His biggest barrier is his home environment being w/c inaccessible. Will need to touch base with his wife to see if home environment can be made more accessible d/t it being unlikely pt will be at ambulatory level at d/c.   Patient continues to demonstrate the following deficits: muscle weakness, decreased cardiorespiratoy endurance, and decreased sitting balance, decreased standing balance, decreased postural control, and decreased balance strategies and therefore will continue to benefit from skilled OT intervention to enhance overall performance with BADL and iADL.  Patient progressing toward long term goals..  Continue plan of care.  OT Short Term Goals Week 1:  OT Short Term Goal 1 (Week 1): Pt will stand from EOB with mod A OT Short Term Goal 1 - Progress (Week 1): Met OT Short Term Goal 2 (Week 1): Pt will don pants sit <> stand with mod A using LRAD OT Short Term Goal 2 - Progress (Week 1): Met OT Short Term Goal 3 (Week 1): Pt will complete stand pivot transfer with mod A OT Short Term Goal 3 - Progress (Week 1): Progressing toward goal Week 2:  OT Short Term Goal 1 (Week 2): Pt will stand from w/c with min A OT Short Term Goal 2 (Week 2): Pt will complete stand pivot transfer with mod A OT Short Term Goal 3 (Week 2): Pt will complete toileting tasks with min A overall  Skilled Therapeutic Interventions/Progress  Updates:    Pt received supine with no c/o pain, agreeable to OT session. Requesting to go to [facility]. Size medium limb guard donned. Pt able to change his own shrinker. CGA for combo lateral scoot- squat pivot to drop arm BSC. Cueing and eduction provided to really lift bottom and not risk equipment tipping with a premature scoot. He managed clothing with min A while lateral leaning, with cueing for technique. He completed peri hygiene with forward weight shift with min A. He attempted to stand x3 from the low BSC (fixed in lowest position) but was unable with max A. He completed a lateral scoot to the w/c with CGA. He stood at the sink with mod A while OT provided total A to don new brief and pull up pants. He has heavy reliance on BUE in standing. He propelled the w/c to the therapy gym with no rest break. He stood from the w/c with the RW mod A. He had great difficulty pivoting to the mat, requiring max A to guide hips with hard landing. He completed 2 more trials of standing with min-mod A to stand and then focusing on UE lift to move RLE. This was very difficult for pt and he required heavy mod A. He returned to his room and was left sitting up with all needs met.   Therapy Documentation Precautions:  Precautions Precautions: Fall Precaution Comments: L BKA Other Brace: L limb protector Restrictions Weight Bearing Restrictions: No  LLE Weight Bearing: Non weight bearing   Therapy/Group: Individual Therapy  Scott Castillo 06/23/2022, 7:45 AM

## 2022-06-23 NOTE — Progress Notes (Signed)
[facility] Progress Note   Subjective: Seen in room, ordering his lunch. No C/Os. HD later this afternoon.      Objective Vitals:   06/22/22 1915 06/23/22 0318 06/23/22 0500 06/23/22 1333  BP: (!) 157/74 (!) 144/86  (!) 143/83  Pulse: 85 72  74  Resp: '16 18  16  '$ Temp: 98.6 F (37 C) 97.9 F (36.6 C)  97.9 F (36.6 C)  TempSrc: Oral   Oral  SpO2: 95% 96%  93%  Weight:   107.7 kg   Height:       Physical Exam General: chronically ill appearing male in NAD Heart: S1,S2 No M/R/G Lungs: CTAB. No WOB.  Abdomen: NT, NABS Extremities: L BKA stump protector in place. chronic stasis changes RLE with trace edema Dialysis Access: L AVF +T/B  Additional Objective Labs: Basic Metabolic Panel: Recent Labs  Lab 06/16/22 1451 06/18/22 1524 06/21/22 1253  NA 122* 130* 131*  K 4.6 5.2* 4.9  CL 90* 94* 95*  CO2 20* 24 22  GLUCOSE 214* 169* 194*  BUN 86* 72* 85*  CREATININE 7.66* 7.58* 9.21*  CALCIUM 9.0 9.6 9.5  PHOS 4.3 4.5 4.9*   Liver Function Tests: Recent Labs  Lab 06/16/22 1451 06/18/22 1524 06/21/22 1253  ALBUMIN 1.8* 2.0* 2.1*   No results for input(s): "LIPASE", "AMYLASE" in the last 168 hours. CBC: Recent Labs  Lab 06/16/22 1451 06/18/22 1524 06/21/22 1253  WBC 18.5* 18.1* 15.8*  HGB 8.8* 9.3* 9.1*  HCT 27.6* 30.4* 29.6*  MCV 87.1 88.9 89.2  PLT 288 280 252   Blood Culture    Component Value Date/Time   SDES TISSUE 06/11/2022 1449   SPECREQUEST LEFT BKA 06/11/2022 1449   CULT  06/11/2022 1449    RARE STAPHYLOCOCCUS AUREUS NO ANAEROBES ISOLATED Performed at [facility], [place] [address], [place], [place] [address]    REPTSTATUS 06/17/2022 FINAL 06/11/2022 1449    Cardiac Enzymes: No results for input(s): "CKTOTAL", "CKMB", "CKMBINDEX", "TROPONINI" in the last 168 hours. CBG: Recent Labs  Lab 06/22/22 1634 06/22/22 2025 06/22/22 2101 06/23/22 0559 06/23/22 1152  GLUCAP 172* 206* 195* 138* 197*   Iron Studies: No  results for input(s): "IRON", "TIBC", "TRANSFERRIN", "FERRITIN" in the last 72 hours. '@lablastinr3'$ @ Studies/Results: No results found. Medications:   (feeding supplement) PROSource Plus  30 mL Oral BID BM   amoxicillin-clavulanate  1 tablet Oral q1800   vitamin C  1,000 mg Oral Daily   atorvastatin  40 mg Oral Daily   calcitRIOL  1.25 mcg Oral Q M,W,F-HD   carvedilol  25 mg Oral BID WC   darbepoetin (ARANESP) injection - DIALYSIS  200 mcg Subcutaneous Q Tue-1800   diclofenac Sodium  2 g Topical TID AC   Gerhardt's butt cream   Topical BID   guaiFENesin  600 mg Oral BID   heparin  5,000 Units Subcutaneous Q8H   insulin aspart  0-5 Units Subcutaneous QHS   insulin aspart  0-9 Units Subcutaneous TID with meals   insulin aspart  4 Units Subcutaneous TID WC   insulin glargine-yfgn  12 Units Subcutaneous Daily   irbesartan  75 mg Oral Daily   lactobacillus  1 g Oral TID WC   multivitamin  1 tablet Oral QHS   pantoprazole  40 mg Oral Daily   polycarbophil  625 mg Oral BID   sucroferric oxyhydroxide  1,000 mg Oral TID WC   torsemide  100 mg Oral Once per day on Sun Tue  Thu Sat   zinc sulfate  220 mg Oral Daily     Dialysis Orders: [facility] MWF 4h 75mn  400/800   108.5kg   2/2 bath  LFA AVF   - No Heparin  - rocaltrol 1.25 mcg po tiw - mircera 225 mcg IV  q 2wks, last 2/12, due 2/26 - last Hb 8.6 on 2/14   Assessment/Plan: 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 antibiotics per primary.  Hyponatremia - worsening in the setting of hypervolemia. managing with HD/UF. Fluid restriction reinforced ESRD - on HD MWF. Next HD 06/23/2022 HTN/ volume - Last wt this AM at OP EDW with hyponatremia. Wt should be lower post BKA. Continue lowering volume at tolerated.  Decreased irbesartan to 75 mg PO daily  to allow for more volume to be removed. Glycemic control plays role in high gains. UF as tolerated and optimize volume with HD. 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 s/p 1 unit prbcs 2/22.  Last ESA q.Mondays. Check iron panel when he completes ABX.  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 Cont po vdra and velphoro as binder.  DM2 per pmd. Chronic hyperglycemia, poorly controlled DMT2.  Nutrition - hypoalbuminemia- Continue protein supplement. Fluid restriction  RJimmye Norman Deetra Booton NP-C 06/23/2022, 1:54 PM  CNewell [phone]

## 2022-06-23 NOTE — Progress Notes (Signed)
Occupational Therapy Session Note  Patient Details  Name: Scott Castillo MRN: [id number] Date of Birth: 27-Oct-1960  Today's Date: 06/23/2022 OT Individual Time: [id number] OT Individual Time Calculation (min): 70 min    Short Term Goals: Week 2:  OT Short Term Goal 1 (Week 2): Pt will stand from w/c with min A OT Short Term Goal 2 (Week 2): Pt will complete stand pivot transfer with mod A OT Short Term Goal 3 (Week 2): Pt will complete toileting tasks with min A overall  Skilled Therapeutic Interventions/Progress Updates:    Pt received in the w/c with no c/o pain, agreeable to OT session. He completed 100 ft of w/c propulsion with increased fatigue. He completed a squat pivot transfer to the mat with CGA. He completed tricep pushups 3x repetitions using bilateral yoga blocks to provide elevation. His triceps are very weak, edu provided on importance of strengthening for elevation on the RW to eventually pivot. He used a level 2 theraband to complete 3x10 tricep extension with OT providing manual resistance grading to allow full AROM. Superseted with B row to strengthen rhomboids and lat's bilaterally. He then completed 3x10 lateral leans seated with functional lateral reach outside of BOS to challenge dynamic sitting balance and for carryover to toileting tasks. Close (S) provided. 3x10 modified sit ups with a large wedge posteriorly to provide back support to strengthen core stability. He ended with several standing trials using the RW. Encouraged static standing for as long as he could tolerate. He completed 1 min 10 sec and 30 sec trials, respectively. He returned to his w.c and to his room. He was left sitting up with all needs met.   Therapy Documentation Precautions:  Precautions Precautions: Fall Precaution Comments: L BKA Other Brace: L limb protector Restrictions Weight Bearing Restrictions: No LLE Weight Bearing: Non weight bearing   Therapy/Group: Individual Therapy  Curtis Sites 06/23/2022, 9:51 AM

## 2022-06-23 NOTE — Progress Notes (Signed)
Patient ID: Scott Castillo, male   DOB: [birth year], 62 y.o.   MRN: [id number]  Met with pt and spoke with wife via telephone to give team conference update regarding goals of CGA level and quite possibly wheelchair level due to the difficulty with him hopping with his one leg. Wife wanted to know if he could go without the wheelchair and use a walker at home due to space issue. She is not sure the hospital bed will fit in the living room due to they have too much stuff. She is not sure the ramp will be down by 3/21 either. Gave both this date for target discharge. Pt mentioned he may need to go to his Mom and Stepfather's home but both are elderly and have health issues of their own, but house has a ramp entry. Will need to work on best place for him to go so he can get back and forth to OP-HD. Wife is asking for the FMLA forms she left for pt to give to MD and he did. Have messaged MD to get completed forms so wife can get her leave approved. Will continue to work on discharge needs.

## 2022-06-23 NOTE — Progress Notes (Signed)
Post Hemodialysis Tx   06/23/22 1935  Vitals  Temp (!) 97.4 F (36.3 C)  Pulse Rate 79  Resp 14  BP (!) 148/93  SpO2 93 %  Weight 106.2 kg  Type of Weight Post-Dialysis  Oxygen Therapy  Patient Activity (if Appropriate) In bed  Pulse Oximetry Type Continuous  Post Treatment  Dialyzer Clearance Lightly streaked  Duration of HD Treatment -hour(s) 4 hour(s)  Liters Processed 96  Fluid Removed (mL) 4000 mL  Tolerated HD Treatment Yes  Post-Hemodialysis Comments Tx tolerated well, UF goal met, VS stable.  AVG/AVF Arterial Site Held (minutes) 5 minutes  AVG/AVF Venous Site Held (minutes) 5 minutes

## 2022-06-23 NOTE — Progress Notes (Signed)
Physical Therapy Weekly Progress Note  Patient Details  Name: Scott Castillo MRN: [id number] Date of Birth: 1960/06/13  Beginning of progress report period: June 16, 2022 End of progress report period: June 23, 2022  Today's Date: 06/23/2022 PT Individual Time: 1105-1200 PT Individual Time Calculation (min): 55 min   Patient has met 1 of 4 short term goals.  Pt has made slow progress during current week of therapy. Pt initially limited by poor endurance which was most noted on HD days however incorporating w/c mobility and general therex pt has demonstrated improvements. Pt is now able to perform lateral scoot and slide board transfers with CGA. Pt is supervision with w/c propulsion however requires assistance for w/c management parts. Pt requires modA to stand from w/c level surfaces and minA to stand from elevated surfaces however due to BUE weakness and generalized deconditioning pt is currently unable to generate enough power to hop consistently to advance gait training. Conversations have been ongoing regarding modifying goals due to limited standing tolerance. Conversations have also been ongoing regarding home disposition (going to parent's home with ramp vs going to personal home/if ramp will be built on time).   Patient continues to demonstrate the following deficits muscle weakness and decreased cardiorespiratoy endurance and therefore will continue to benefit from skilled PT intervention to increase functional independence with mobility.  Patient progressing toward long term goals..  Plan of care revisions: Due to slow progress towards LTG's, goals adjusted for pt to discharge as primarily [facility] user to allow for safe modified independence at home when ready for dsicharge.  PT Short Term Goals Week 1:  PT Short Term Goal 1 (Week 1): Patient will performed sit/stand with LRAD and ModA PT Short Term Goal 1 - Progress (Week 1): Met PT Short Term Goal 2 (Week 1): Patient will performed  stand pivot transfer with LRAD and Mod/MaxA x1 PT Short Term Goal 2 - Progress (Week 1): Not met PT Short Term Goal 3 (Week 1): Patient will initiate gait training PT Short Term Goal 3 - Progress (Week 1): Not met Week 2:  PT Short Term Goal 1 (Week 2): Patient will perform bed<>chair transfer at [facility] level with slide board. PT Short Term Goal 2 (Week 2): Patient will manage WC brakes, leg rests, armrests with supervision/cues only for safe propulsion and WC transfers. PT Short Term Goal 3 (Week 2): Pt will self propel WC 100' with bil UE and no seated rest break with supervision only.    Therapy Documentation Precautions:  Precautions Precautions: Fall Precaution Comments: L BKA Other Brace: L limb protector Restrictions Weight Bearing Restrictions: No LLE Weight Bearing: Non weight bearing   Therapy/Group: Individual Therapy  Rosita DeChalus 06/23/2022, 3:20 PM   Gwynneth Albright PT, DPT Acute Rehabilitation Services Office [phone]  06/25/22 6:51 AM

## 2022-06-23 NOTE — Progress Notes (Signed)
Physical Therapy Session Note  Patient Details  Name: Scott Castillo MRN: [id number] Date of Birth: 14-May-1960  Today's Date: 06/23/2022 PT Individual Time: 1105-1200 PT Individual Time Calculation (min): 55 min   Short Term Goals: Week 1:  PT Short Term Goal 1 (Week 1): Patient will performed sit/stand with LRAD and ModA PT Short Term Goal 2 (Week 1): Patient will performed stand pivot transfer with LRAD and Mod/MaxA x1 PT Short Term Goal 3 (Week 1): Patient will initiate gait training  Skilled Therapeutic Interventions/Progress Updates: Pt presented in w/c sleeping but easily aroused and agreeable to therapy. Pt states mild unrated pain with RLE with mobility and weight bearing. Pt propelled to MW nsg station requiring x 1 rest break due to fatigue. Pt transported remaining distance for energy conservation. Pt attempted to remove leg rests but required minA for removal. Pt then performed lateral scoot transfer to mat with CGA. Pt noted not to have significant hip clearance this session. Pt then participated in x 5 Sit to stand with RW from elevated height. Pt required increased time between bouts but was able to consistently performed all 5 with minA. PTA also with discussion with pt regarding being able to perform lateral scoot vs progression to stand pivot. Pt stating feels that R thigh pain limiting progress. Per pt MD suggested massage area however pt does not have hand strength to apply deep pressure. Per pt is not opposed to use of muscle rub with PTA offering to ask MD during weekly conference which will occur after this session. Pt then worked on lateral scoots with emphasis on clearing hips. Pt was able to perform scoots to L with CGA and consistently clearing hips however did not clear hips as well when transferring back to w/c although transfer was completed with CGA. Pt attempted but required minA for amputee pad and ELR placement. Pt then propelled back to room requiring x 3 rest breaks  and in room performed lateral scoot transfer to bed with minA. Pt transferred to supine with supervision and PTA performed STM and cross friction to R ITB with pt indicating some relief. Pt left in bed at end of session with bed alarm on, call bell with in reach and needs met.      Therapy Documentation Precautions:  Precautions Precautions: Fall Precaution Comments: L BKA Other Brace: L limb protector Restrictions Weight Bearing Restrictions: No LLE Weight Bearing: Non weight bearing General:   Vital Signs: Therapy Vitals Temp: 97.9 F (36.6 C) Temp Source: Oral Pulse Rate: 74 Resp: 16 BP: (!) 143/83 Patient Position (if appropriate): Lying Oxygen Therapy SpO2: 93 % O2 Device: Room Air Pain:   Mobility:   Locomotion :    Trunk/Postural Assessment :    Balance:   Exercises:   Other Treatments:      Therapy/Group: Individual Therapy  Evelynn Hench 06/23/2022, 2:57 PM

## 2022-06-24 LAB — GLUCOSE, CAPILLARY
Glucose-Capillary: 182 mg/dL — ABNORMAL HIGH (ref 70–99)
Glucose-Capillary: 123 mg/dL — ABNORMAL HIGH (ref 70–99)
Glucose-Capillary: 129 mg/dL — ABNORMAL HIGH (ref 70–99)
Glucose-Capillary: 125 mg/dL — ABNORMAL HIGH (ref 70–99)

## 2022-06-24 MED ORDER — HYDRALAZINE HCL 10 MG PO TABS: 10.0000 mg | ORAL_TABLET | Freq: Four times a day (QID) | ORAL | Status: DC | PRN

## 2022-06-24 NOTE — Progress Notes (Signed)
Occupational Therapy Session Note  Patient Details  Name: Scott Castillo MRN: [id number] Date of Birth: 01-Nov-1960  Today's Date: 06/24/2022 OT Individual Time: [id number] OT Individual Time Calculation (min): 75 min    Short Term Goals: Week 2:  OT Short Term Goal 1 (Week 2): Pt will stand from w/c with min A OT Short Term Goal 2 (Week 2): Pt will complete stand pivot transfer with mod A OT Short Term Goal 3 (Week 2): Pt will complete toileting tasks with min A overall  Skilled Therapeutic Interventions/Progress Updates:    Pt received supine with no c/o pain, agreeable to OT session. He reports fatigue from late HD session yesterday. He came to EOB with mod I using bed rails. He was able to don his shoe with close (S)- first time doing this! He completed squat pivot transfer to the wc with CGA, cueing for fully clearing hips. Pt completed w/c propulsion to the therapy gym with several rest breaks. Patient required increased time for initiation, cuing, rest breaks, and for completion of tasks throughout session. Utilized therapeutic use of self throughout to promote efficiency. He had poor problem solving and carryover of education for removing w/c parts. He sat EOM and had noticeable pallor and reported increase in fatigue. BP assessed- 157/107 and 161/100. MD rounding during session and aware. No changes. He then completed standing level functional reaching task with mod A to stand from EOM and min-mod A to remain standing when removing a UE from the RW. He was very easily fatigued today and required more frequent and longer rest breaks between activities. PROM was provided with gentle myofascial release to the quad to facilitate knee extension. Padding was applied to his limb guard to prevent velcro from rubbing on the R leg from the medial straps. He returned to his room and was left supine with all needs met.   Therapy Documentation Precautions:  Precautions Precautions: Fall Precaution  Comments: L BKA Other Brace: L limb protector Restrictions Weight Bearing Restrictions: No LLE Weight Bearing: Non weight bearing  Therapy/Group: Individual Therapy  Curtis Sites 06/24/2022, 6:33 AM

## 2022-06-24 NOTE — Progress Notes (Addendum)
PROGRESS NOTE   Subjective/Complaints:  HD completed yesterday, he feels fatigued from this treatment.  BP has been a little elevated.  Reports muscle rub is helping his RLE pain.     Review of Systems  Constitutional:  Positive for malaise/fatigue. Negative for chills and fever.  HENT:  Positive for congestion.   Respiratory:  Positive for cough. Negative for sputum production and shortness of breath.   Cardiovascular:  Negative for chest pain.  Gastrointestinal:  Negative for abdominal pain, nausea and vomiting.  Musculoskeletal:  Positive for myalgias. Negative for joint pain.  Neurological:  Positive for weakness. Negative for dizziness and headaches.    Objective:   No results found.    Recent Labs    06/21/22 1253 06/23/22 1515  WBC 15.8* 13.2*  HGB 9.1* 8.7*  HCT 29.6* 27.3*  PLT 252 257    Recent Labs    06/21/22 1253 06/23/22 1515  NA 131* 129*  K 4.9 5.2*  CL 95* 92*  CO2 22 23  GLUCOSE 194* 159*  BUN 85* 77*  CREATININE 9.21* 8.03*  CALCIUM 9.5 9.2     Intake/Output Summary (Last 24 hours) at 06/24/2022 0807 Last data filed at 06/24/2022 0800 Gross per 24 hour  Intake 625 ml  Output 4000 ml  Net -3375 ml         Physical Exam: Vital Signs Blood pressure (!) 143/94, pulse 91, temperature 97.7 F (36.5 C), temperature source Oral, resp. rate 16, height 6' (1.829 m), weight 103.6 kg, SpO2 96 %.    General: No acute distress, working in gym with therapy Mood and affect are appropriate Heart: Regular rate and rhythm no rubs murmurs or extra sounds Lungs: Clear to auscultation, breathing unlabored, no rales or wheezes. Cough sounds less frequent Abdomen: Positive bowel sounds, soft nontender to palpation, nondistended Extremities: L BKA L AVF   Psych: Pt's affect is a little flat Skin: Small scabs on right great toe. Stasis changes with papery skin right shin and healed abrasions.  Limb guard - VAC has been removed Venous stasis changes on RLE Neuro:    Alert and awake, CN 2-12 grossly intact, follows commands    Comments: Decreased to light touch from R mid shin downwards;  Musculoskeletal:  UE strength 5/5 in Biceps, triceps, WE, grip and FA B/L RLE- 5/5 in HF, KE, KF- but moderate foot drop- DF 2-/5; and PF 2-/5 LLE- HF/KE/KF 5-/5- after limb guard removed Mild tenderness R thigh-improved   Assessment/Plan: 1. Functional deficits which require 3+ hours per day of interdisciplinary therapy in a comprehensive inpatient rehab setting. Physiatrist is providing close team supervision and 24 hour management of active medical problems listed below. Physiatrist and rehab team continue to assess barriers to discharge/monitor patient progress toward functional and medical goals  Care Tool:  Bathing    Body parts bathed by patient: Right arm, Left arm, Abdomen, Chest, Front perineal area, Right upper leg, Left upper leg, Face, Buttocks, Right lower leg   Body parts bathed by helper: Buttocks, Right lower leg Body parts n/a: Left lower leg   Bathing assist Assist Level: Contact Guard/Touching assist (shower level seated)     Upper  Body Dressing/Undressing Upper body dressing   What is the patient wearing?: Pull over shirt    Upper body assist Assist Level: Set up assist    Lower Body Dressing/Undressing Lower body dressing      What is the patient wearing?: Pants, Incontinence brief     Lower body assist Assist for lower body dressing: Moderate Assistance - Patient 50 - 74%     Toileting Toileting    Toileting assist Assist for toileting: Moderate Assistance - Patient 50 - 74%     Transfers Chair/bed transfer  Transfers assist     Chair/bed transfer assist level: Contact Guard/Touching assist (squat pivot)     Locomotion Ambulation   Ambulation assist   Ambulation activity did not occur: Safety/medical concerns (Unable to ambulated at  this time secondary to new BKA with global deconditioning)          Walk 10 feet activity   Assist  Walk 10 feet activity did not occur: Safety/medical concerns        Walk 50 feet activity   Assist Walk 50 feet with 2 turns activity did not occur: Safety/medical concerns         Walk 150 feet activity   Assist Walk 150 feet activity did not occur: Safety/medical concerns         Walk 10 feet on uneven surface  activity   Assist Walk 10 feet on uneven surfaces activity did not occur: Safety/medical concerns         Wheelchair     Assist Is the patient using a wheelchair?: Yes Type of Wheelchair: Manual    Wheelchair assist level: Supervision/Verbal cueing Max wheelchair distance: 150    Wheelchair 50 feet with 2 turns activity    Assist        Assist Level: Supervision/Verbal cueing   Wheelchair 150 feet activity     Assist      Assist Level: Supervision/Verbal cueing   Blood pressure (!) 143/94, pulse 91, temperature 97.7 F (36.5 C), temperature source Oral, resp. rate 16, height 6' (1.829 m), weight 103.6 kg, SpO2 96 %.  Medical Problem List and Plan: 1. 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 Sharol Given regarding wound vac removal- OK to remove vac- stump shrinker and dry dressing   -wound vac was discontinued 3/4  -Exp discharge 3/21   2.  Antithrombotics: -DVT/anticoagulation:  Pharmaceutical: Heparin             -antiplatelet therapy: NA 3. Pain Management: Oxycodone prn for pain. No phantom pain so far  -muscle rub for R thigh started- pain improved 4. Mood/Behavior/Sleep: LCSW to follow for evaluation and support.              -antipsychotic agents: N/A 5. Neuropsych/cognition: This patient is capable of making decisions on his own  behalf. 6. Skin/Wound Care: Routine pressure relief measures.  7. Fluids/Electrolytes/Nutrition: Strict I/O. Daily weights. Enforce 1200 cc FR.             --Reports on multiple supplements--d/c nephro.  --Continue Juven bid, Zinc and Vitamin-C to promote wound healing.  -Hyponatremia- improved on labs 3/4 to 131 8. Left calf staph positive cultures: IV antibiotics changed to Augmentin  X 19 days per recs.  (stop 07/03/22) 9. HTN: Monitor BP TID--continue Avapro (reduced to '75mg'$  2/27) and Demadex -STTS.   -3/7 irbesartan was previously decreased to allow for more volume to be reduced by nephrology, continue to monitor, BP mildy elevated but stable overall, hesitant to increase BP medications due to dialysis, if BP increases further may need increase irbesartan, Add hydralazine PRN SBP > 180     06/24/2022    4:26 AM 06/23/2022    8:54 PM 06/23/2022    7:35 PM  Vitals with BMI  Weight 228 lbs 6 oz  234 lbs 2 oz  BMI [id number]  [id number]  Systolic A999333 [id number]  Diastolic 94 80 93  Pulse 91 84 79    10. T2DM: Hgb A1C- 7.4. Was on Glimepiride and Rubelsus PTA.  -- Monitor BS ac/hs and use SSI for tighter control.         -2/28 glargine increase to 8 units yesterday, SSI changed to sensitive. CBGs elevated, Monitor        -continue Insulin glargine w/ 2 units novolog TID AC ---> increase to 3u TID (3/1)  -3/4 Increase glargine to 12 units from 10 units  -3/6 increase novolog to 4 units TID AC  -3/7 Glucose control improving-continue to monitor trend  CBG (last 3)  Recent Labs    06/23/22 1152 06/23/22 2219 06/24/22 0615  GLUCAP 197* 110* 182*     11. ESRD: Volume being managed with HD MWF. Is basically anuric- voided x2 since admission. Via fistula --Will schedule HD at the end of the day to help with tolerance of therapy.  12.  Leucocytosis due to LLE osteomyelitis- : Monitor for fevers and other signs of infection.              --WBC resolving 16.4-->22.2-->16.7--> 18.5 2/29, monitor for signs  of infection 13. Hyponatremia: Being managed with HD.  14. Cough: SOB with cough noted during exam-->he reports at baseline X 3 weeks and it due to dry air?             --? Due to fluid overload. Will order throat spray and CXR.              --encourage pulmonary hygiene.   -2/28 CXR with atelectasis vs PNA, continue augmentin as above, start mucinex, continue incentive spirometer  -3/1 resp panel negative, cough a little improved  Cough gradually improving 15. Right hip pain: Fall 3 weeks PTA. Will order Voltaren gel TID.  16. Loose stools- due to IV ABX per pt- might need a stool bulker/fiber-   -3/1 Fibercon started '625mg'$  BID  -3/7 improving, having regular BMs      LOS: 9 days A FACE TO FACE EVALUATION WAS PERFORMED  Jennye Boroughs 06/24/2022, 8:07 AM

## 2022-06-24 NOTE — Patient Care Conference (Signed)
Inpatient RehabilitationTeam Conference and Plan of Care Update Date: 06/23/2022   Time: 12:03 PM    Patient Name: Scott Castillo      Medical Record Number: [id number]  Date of Birth: 1960/11/20 Sex: Male         Room/Bed: 4M11C/4M11C-01 Payor Info: Payor: MEDICARE / Plan: MEDICARE PART A AND B / Product Type: *No Product type* /    Admit Date/Time:  06/15/2022  5:02 PM  Primary Diagnosis:  Unilateral complete BKA, left, initial encounter [facility])  Hospital Problems: Principal Problem:   Unilateral complete BKA, left, initial encounter [facility])    Expected Discharge Date: Expected Discharge Date: 07/08/22  Team Members Present: Physician leading conference: Dr. Jennye Boroughs Social Worker Present: Ovidio Kin, LCSW Nurse Present: Dorien Chihuahua, RN PT Present: Becky Sax, PT;Rosita Dechalus, PTA OT Present: Laverle Hobby, OT PPS Coordinator present : Gunnar Fusi, SLP     Current Status/Progress Goal Weekly Team Focus  Bowel/Bladder   Patient is continent of bowel using a bed pain.   Patient to remain continent. Patient to be able to use a bedside commode.   Help patient with transfer to bedside commode.    Swallow/Nutrition/ Hydration               ADL's   (S) UB ADLs, mod A LB ADLs, CGA slideboard/squat pivot, mod-max A to stand   CGA overall   sit <> stand, ADLs, transfers, limb loss education    Mobility   supervision to mod I bed mobilty with use of bed features, light minA lateral scoot transfers, min to modA squat pivot transfers, modA sit to stand, only able to take 2-3 hops at this time, supervision w/c mobiity with minA for w/c parts management.   mod I w/c level, CGA stand pivot, CGA ambulation 59f  sit to stand, standing tolerance, RLE strengthening, endurance, d/c planning    Communication                Safety/Cognition/ Behavioral Observations               Pain   Patient denied pain   Patient to remain free of pain during hospital  stay.   continue pain assessment. Provide pain treatment based on reported pain level.    Skin   Patient has non-blanchable redness to sacrum/bilateral buttocks.   skin improvement/ Absence of redness.  Provide skin care with cream and powder as ordered.      Discharge Planning:  Wife to take one month off to assist with his care at discharge. [employer] building ramp for him. Home measurement sheet see if wc will fit in home   Team Discussion: Patient with chronic cough and pain with variable blood pressure. New muscular pain; MD added Voltaren gel. Loose stools addressed with Imodium and lactobacillus. DM medications adjusted and continues  HD for ESRD.   Patient on target to meet rehab goals: yes, currently needs supervision for upper body care and mod assist for lower body care. Completes lateral squat pivots with CGA and can stand from an elevated surface; sit - stand with min assist  *See Care Plan and progress notes for long and short-term goals.   Revisions to Treatment Plan:  Shrinker adjusted/limb guard flared Oral Augmentin on discharge for total of 3-week therapy after surgery.    Teaching Needs: Safety, medications, skin care, dietary modifications, transfers, toileting, etc. 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, DM 2, Cough, HTN, ESRD, muscle pain Possible Resolutions to [employer] Focus: Muscle rub, continue abx, HTN, continue HD   Continued Need for Acute Rehabilitation Level of Care: The  patient requires daily medical management by a physician with specialized training in physical medicine and rehabilitation for the following reasons: Direction of a multidisciplinary physical rehabilitation program to maximize functional independence : Yes Medical management of patient stability for increased activity during participation in an intensive rehabilitation regime.: Yes Analysis of laboratory values and/or radiology reports with any subsequent need for medication adjustment and/or medical intervention. : Yes   I attest that I was present, lead the team conference, and concur with the assessment and plan of the team.   Dorien Chihuahua B 06/24/2022, 10:29 AM

## 2022-06-24 NOTE — Progress Notes (Signed)
Patient back to unit after a 4 hours HD session. 4 Liters fluid removed per report from [place]. Patient laying in bed without any distress at this moment. Patient's food tray was removed from the room before his return. Dietary services contacted and agreed to deliver another tray to the patient. Vital signs taken, and preparing to give all scheduled and missed medication while on dialysis.

## 2022-06-24 NOTE — Progress Notes (Signed)
Physical Therapy Session Note  Patient Details  Name: Scott Castillo MRN: [id number] Date of Birth: June 02, 1960  Today's Date: 06/24/2022 PT Individual Time: 1115-1158 PT Individual Time Calculation (min): 43 min   Short Term Goals: Week 1:  PT Short Term Goal 1 (Week 1): Patient will performed sit/stand with LRAD and ModA PT Short Term Goal 1 - Progress (Week 1): Met PT Short Term Goal 2 (Week 1): Patient will performed stand pivot transfer with LRAD and Mod/MaxA x1 PT Short Term Goal 2 - Progress (Week 1): Not met PT Short Term Goal 3 (Week 1): Patient will initiate gait training PT Short Term Goal 3 - Progress (Week 1): Not met  Skilled Therapeutic Interventions/Progress Updates:      Pt in bed sleeping on arrival - awakens to voice and in agreement to therapy session. Denies any significant pain. Reports fatigue from late night return from dialysis. Bed mobility completed mod I with hospital bed features. Patient already with his L limb guard on and removes at the end of session without assist or cues.   Squat<>pivot to his L from EOB to w/c with CGA - assist for w/c setup and placement. He needs assistance with managing w/c parts (arm rests, leg rests). Propels himself mod I in hallways , short distances ~50-77f, with distances limited 2/2 fatigue. [facility] remaining [facility] w/c propulsion on unlevel surfaces with varying terrain. Patient requiring supervision for navigating sloped and uneven surfaces, has appropriate safety awareness for making corrections and adapting when needed. Continues to be limited by endurance deficits. Lengthy discussion regarding home safety, DC prep/planning, return to life after CIR, primary barriers at home, etc. Patient voices his [place] concern is fitting his w/c in doorways and spaces in his double wide mobile home, with possibility of living temporarily at his parents. Educated him on long term goal of getting a ramp  installed at his house to ease entrance for safety and efficiency - pt reports his church community will be helping him with this but unsure of timeline.   Dependently transported patient back upstairs to CIR floor and assisted back to bed in similar manner as above. Provided him with ice water and NT in room for routine blood sugar testing before meal. All needs met.   Therapy Documentation Precautions:  Precautions Precautions: Fall Precaution Comments: L BKA Other Brace: L limb protector Restrictions Weight Bearing Restrictions: No LLE Weight Bearing: Non weight bearing General:    Therapy/Group: Individual Therapy  CAlger Simons3/10/2022, 7:47 AM

## 2022-06-24 NOTE — Progress Notes (Signed)
Physical Therapy Session Note  Patient Details  Name: Scott Castillo MRN: [id number] Date of Birth: [birth year]  Today's Date: 06/24/2022 PT Individual Time: 1000-1045 PT Individual Time Calculation (min): 45 min   Short Term Goals: Week 1:  PT Short Term Goal 1 (Week 1): Patient will performed sit/stand with LRAD and ModA PT Short Term Goal 1 - Progress (Week 1): Met PT Short Term Goal 2 (Week 1): Patient will performed stand pivot transfer with LRAD and Mod/MaxA x1 PT Short Term Goal 2 - Progress (Week 1): Not met PT Short Term Goal 3 (Week 1): Patient will initiate gait training PT Short Term Goal 3 - Progress (Week 1): Not met Week 2:     Skilled Therapeutic Interventions/Progress Updates: Pt presented in w/c agreeable to therapy. Pt denies pain at rest. Pt states some fatigue from previous day's HD. BP was noted to be elevated earlier but pt states was a bit better. Pt propelled to main gym mod I with several rest breaks due to fatigue. Pt expressed concern regarding difficulty removing limb guard. PTA obtained co-band and Velcro to allow for improved grasp which pt was appreciative for. Pt expressed to concern regarding d/c date and if he would be ambulatory prior to d/c. Expressed that currently with continue to work on standing/standing tolerance/progression to ambulation however most likely will not be able to ambulate through home upon d/c. Pt acknowledges that may have to stay with parents but concerned with level of assist they will have to provide. Explained that currently goals are set to require no physical assist for transfers (stand or squat). Pt then performed squat pivot transfer to high/low mat with CGA. Pt then provided with yoga blocks and performed seated push ups 2 x 5 (limited due to fatigue). Pt then performed anterior leans into chair x 10 for closed chain activation. Pt then performed lateral scoots to w/c demonstrating improved hip clearance. Pt was able to demonstrate  improve squat pivot transfer to w/c with supervision. Pt then transported back to room due to fatigue. Pt set up w/c and performed squat pivot transfer with fair hip clearance to bed with supervision. Pt transferred to supine mod I and left resting with bed alarm on, call bell within reach and needs met.      Therapy Documentation Precautions:  Precautions Precautions: Fall Precaution Comments: L BKA Other Brace: L limb protector Restrictions Weight Bearing Restrictions: No LLE Weight Bearing: Non weight bearing General:   Vital Signs:  Pain: Pain Assessment Pain Scale: 0-10 Pain Score: 0-No pain Mobility:   Locomotion :    Trunk/Postural Assessment :    Balance:   Exercises:   Other Treatments:      Therapy/Group: Individual Therapy  Marisella Puccio 06/24/2022, 12:04 PM

## 2022-06-24 NOTE — Progress Notes (Signed)
Physical Therapy Session Note  Patient Details  Name: Scott Castillo MRN: [id number] Date of Birth: 29-Jul-1960  Today's Date: 06/24/2022 PT Individual Time: 1350-1430 PT Individual Time Calculation (min): 40 min   Short Term Goals: Week 1:  PT Short Term Goal 1 (Week 1): Patient will performed sit/stand with LRAD and ModA PT Short Term Goal 1 - Progress (Week 1): Met PT Short Term Goal 2 (Week 1): Patient will performed stand pivot transfer with LRAD and Mod/MaxA x1 PT Short Term Goal 2 - Progress (Week 1): Not met PT Short Term Goal 3 (Week 1): Patient will initiate gait training PT Short Term Goal 3 - Progress (Week 1): Not met Week 2:     Skilled Therapeutic Interventions/Progress Updates: Pt presented in bed agreeable to therapy. Pt denies pain at start of session. Performed bed mobility distant supervision with PTA assisting donning limb guard. PTA set up w/c and pt performed lateral scoot transfer to w/c. Due to energy conservation PTA transferred pt to ortho gym and pt performed squat pivot transfer to mat with supervision. Pt then worked on Sit to stand from 23 in mat. On first stand pt able to stand with minA and maintain standing for approx 1 min. Upon descent pt noted some increased pain in residual limb. Shrinker removed and skin inspected with no significant changes. Limb guard reapplied and pt able to complete Sit to stand from 23in height with minA. Once completed pt performed squat pivot transfer back to w/c in same manner as prior and performed squat pivot transfer to bed with supervision. Pt transferred back to supine mod I and left in bed at end of session with bed alarm on, call bell within reach and needs met.      Therapy Documentation Precautions:  Precautions Precautions: Fall Precaution Comments: L BKA Other Brace: L limb protector Restrictions Weight Bearing Restrictions: No LLE Weight Bearing: Non weight bearing General:   Vital Signs: Therapy Vitals Temp:  98 F (36.7 C) Temp Source: Oral Pulse Rate: 73 Resp: 17 BP: 136/88 Patient Position (if appropriate): Lying Oxygen Therapy SpO2: 94 % O2 Device: Room Air Pain:   Mobility:   Locomotion :    Trunk/Postural Assessment :    Balance:   Exercises:   Other Treatments:      Therapy/Group: Individual Therapy  [facility] 06/24/2022, 3:37 PM

## 2022-06-25 DIAGNOSIS — G4700 Insomnia, unspecified: Secondary | ICD-10-CM

## 2022-06-25 LAB — GLUCOSE, CAPILLARY
Glucose-Capillary: 267 mg/dL — ABNORMAL HIGH (ref 70–99)
Glucose-Capillary: 103 mg/dL — ABNORMAL HIGH (ref 70–99)
Glucose-Capillary: 101 mg/dL — ABNORMAL HIGH (ref 70–99)
Glucose-Capillary: 162 mg/dL — ABNORMAL HIGH (ref 70–99)

## 2022-06-25 LAB — CBC
HCT: 26.9 % — ABNORMAL LOW (ref 39.0–52.0)
Hemoglobin: 8.5 g/dL — ABNORMAL LOW (ref 13.0–17.0)
MCH: 28 pg (ref 26.0–34.0)
MCHC: 31.6 g/dL (ref 30.0–36.0)
MCV: 88.5 fL (ref 80.0–100.0)
Platelets: 225 10*3/uL (ref 150–400)
RBC: 3.04 MIL/uL — ABNORMAL LOW (ref 4.22–5.81)
RDW: 17.5 % — ABNORMAL HIGH (ref 11.5–15.5)
WBC: 11.5 10*3/uL — ABNORMAL HIGH (ref 4.0–10.5)
nRBC: 0 % (ref 0.0–0.2)

## 2022-06-25 LAB — RENAL FUNCTION PANEL
Albumin: 2.2 g/dL — ABNORMAL LOW (ref 3.5–5.0)
Anion gap: 13 (ref 5–15)
BUN: 73 mg/dL — ABNORMAL HIGH (ref 6–20)
CO2: 25 mmol/L (ref 22–32)
Calcium: 9.2 mg/dL (ref 8.9–10.3)
Chloride: 92 mmol/L — ABNORMAL LOW (ref 98–111)
Creatinine, Ser: 7.81 mg/dL — ABNORMAL HIGH (ref 0.61–1.24)
GFR, Estimated: 7 mL/min — ABNORMAL LOW (ref >60)
Glucose, Bld: 98 mg/dL (ref 70–99)
Phosphorus: 5.2 mg/dL — ABNORMAL HIGH (ref 2.5–4.6)
Potassium: 5.1 mmol/L (ref 3.5–5.1)
Sodium: 130 mmol/L — ABNORMAL LOW (ref 135–145)

## 2022-06-25 MED ORDER — PENTAFLUOROPROP-TETRAFLUOROETH EX AERO: 1.0000 | INHALATION_SPRAY | CUTANEOUS | Status: DC | PRN

## 2022-06-25 MED ORDER — MELATONIN 3 MG PO TABS
3.0000 mg | ORAL_TABLET | Freq: Every day | ORAL | Status: DC
  Administered 2022-06-25 – 2022-07-06 (×12): 3 mg via ORAL
  Filled 2022-06-25 (×12): qty 1

## 2022-06-25 MED ORDER — HEPARIN SODIUM (PORCINE) 1000 UNIT/ML IJ SOLN
2500.0000 [IU] | Freq: Once | INTRAMUSCULAR | Status: AC
  Administered 2022-06-25: 2500 [IU] via INTRAVENOUS
  Filled 2022-06-25: qty 3
  Filled 2022-06-25: qty 2.5

## 2022-06-25 MED ORDER — LIDOCAINE-PRILOCAINE 2.5-2.5 % EX CREA: 1.0000 | TOPICAL_CREAM | CUTANEOUS | Status: DC | PRN

## 2022-06-25 MED ORDER — LIDOCAINE HCL (PF) 1 % IJ SOLN: 5.0000 mL | INTRAMUSCULAR | Status: DC | PRN

## 2022-06-25 NOTE — Progress Notes (Signed)
POST HD TX NOTE  06/25/22 1829  Vitals  Temp 97.8 F (36.6 C)  Temp Source Oral  BP 138/82  MAP (mmHg) 99  BP Location Right Arm  BP Method Automatic  Patient Position (if appropriate) Lying  Pulse Rate 99  Pulse Rate Source Monitor  ECG Heart Rate 83  Resp 17  Oxygen Therapy  SpO2 92 %  O2 Device Room Air  Pulse Oximetry Type Continuous  During Treatment Monitoring  Intra-Hemodialysis Comments (S)   (post HD tx VS check)  Post Treatment  Dialyzer Clearance (S)  Lightly streaked (w/ clots at top of dialyzer)  Duration of HD Treatment -hour(s) 4.25 hour(s)  Hemodialysis Intake (mL) 0 mL  Liters Processed 102  Fluid Removed (mL) 5000 mL  Tolerated HD Treatment Yes  Post-Hemodialysis Comments (S)  tx completed w/o problem, UF goal met, blood rinsed back. Medication Admin: Heparin 2500 units bolus, Robitussin 52m po  AVG/AVF Arterial Site Held (minutes) 10 minutes  AVG/AVF Venous Site Held (minutes) 10 minutes  Fistula / Graft Left Forearm Arteriovenous fistula  No placement date or time found.   Orientation: Left  Access Location: Forearm  Access Type: Arteriovenous fistula  Site Condition No complications  Fistula / Graft Assessment Aneurysm present;Bruit;Thrill;Present  Drainage Description None

## 2022-06-25 NOTE — Progress Notes (Signed)
Physical Therapy Session Note  Patient Details  Name: Scott Castillo MRN: [id number] Date of Birth: [birth year]  Today's Date: 06/25/2022 PT Individual Time: 0915-1030 PT Individual Time Calculation (min): 75 min   Short Term Goals: Week 2:  PT Short Term Goal 1 (Week 2): Patient will perform bed<>chair transfer at [facility] level with slide board. PT Short Term Goal 2 (Week 2): Patient will manage WC brakes, leg rests, armrests with supervision/cues only for safe propulsion and WC transfers. PT Short Term Goal 3 (Week 2): Pt will self propel WC 100' with bil UE and no seated rest break with supervision only.  Skilled Therapeutic Interventions/Progress Updates:  Patient greeted supine in bed with physician present performing morning rounds- Patient agreeable to PT treatment session. Patient transitioned from semi-reclined to sitting EOB with Supv. While sitting EOB with L residual limb propped on bed, patient was able to don limb guard with MinA. VC for ensuring L knee in fully extended prior to using strap. Therapist donned R shoe with total assist for time management. Patient performed lateral scoot from EOB to wheelchair with SBA. Patient propelled manual wheelchair from his room to rehab gym with B UE and distant Supv. Patient was able to propel ~60 feet at a time before requiring a rest break- 3 rest breaks required throughout wheelchair mobility.   Patient set-up wheelchair in preparation for transfer to mat table with Min/ModA- Therapist provided assistance for B leg rest management as patient is unable to manage secondary to decreased hand strength. Patient performed lateral transfer to/from mat table with SBA. Patient then transitioned to/from supine with supv.   Patient performed supine therex in order to increased B LE strength for improved functional mobility- -R SAQ with 4#, 3 x 10  -L SLR with 3#, 3 x 10 -L hip abduction in sidelying AROM, 3 x 10  -Sustained B hip flexor stretch in prone  on elbows, x5 minutes  Patient performed Mini stands (bottom lifts) from edge of mat table with B UE support and SBA- Patient performed x10 total with rest breaks in between. VC for increased use of R LE compared to UE use.   Patient performed sit/stand with RW x2 with Min/ModA- While in standing, attempted heel raises and "shimmying" on R foot however patient was unable to clear his R foot in either way. During the second stand, patient required heavy ModA secondary to fatigue.   Patient at times became overheated while in the rehab gym and required VC for keeping his shirt on and putting his shirt back on.   Patient transferred back to wheelchair from mat table via lateral scoot and SBA for safety. Patient wheeled back to room and left sitting upright in wheelchair with call bell within reach and all needs met.    Therapy Documentation Precautions:  Precautions Precautions: Fall Precaution Comments: L BKA Other Brace: L limb protector Restrictions Weight Bearing Restrictions: No LLE Weight Bearing: Weight bearing as tolerated   Therapy/Group: Individual Therapy  Kilian Schwartz 06/25/2022, 7:53 AM

## 2022-06-25 NOTE — Progress Notes (Signed)
PROGRESS NOTE   Subjective/Complaints:  Reports he feels less fatigued today. He reports poor sleep.  He is hoping HD will be done earlier in the afternoon today.  He continues to have cough, but gradually improving.     Review of Systems  Constitutional:  Positive for malaise/fatigue. Negative for chills and fever.  HENT:  Positive for congestion.   Respiratory:  Positive for cough. Negative for sputum production and shortness of breath.   Cardiovascular:  Negative for chest pain.  Gastrointestinal:  Negative for abdominal pain, nausea and vomiting.  Musculoskeletal:  Positive for myalgias. Negative for joint pain.  Neurological:  Positive for weakness. Negative for dizziness and headaches.  Psychiatric/Behavioral:  The patient has insomnia.     Objective:   No results found.    Recent Labs    06/23/22 1515  WBC 13.2*  HGB 8.7*  HCT 27.3*  PLT 257    Recent Labs    06/23/22 1515  NA 129*  K 5.2*  CL 92*  CO2 23  GLUCOSE 159*  BUN 77*  CREATININE 8.03*  CALCIUM 9.2     Intake/Output Summary (Last 24 hours) at 06/25/2022 1234 Last data filed at 06/25/2022 0804 Gross per 24 hour  Intake 360 ml  Output --  Net 360 ml         Physical Exam: Vital Signs Blood pressure (!) 142/99, pulse 69, temperature 97.7 F (36.5 C), temperature source Oral, resp. rate 16, height 6' (1.829 m), weight 105.1 kg, SpO2 95 %.    General: No acute distress, working in gym with therapy Mood and affect are appropriate Heart: Regular rate and rhythm no rubs murmurs or extra sounds Lungs: Clear to auscultation, breathing unlabored, no rales or wheezes. Cough sounds less frequent Abdomen: Positive bowel sounds, soft nontender to palpation, nondistended Extremities: L BKA L AVF   Psych: Pt's affect is a little flat Skin: L BKA- incision appears to be healing well- please see image Venous stasis changes on RLE Neuro:     Alert and awake, CN 2-12 grossly intact, follows commands    Comments: Decreased to light touch from R mid shin downwards;  Musculoskeletal:  UE strength 5/5 in Biceps, triceps, WE, grip and FA B/L RLE- 5/5 in HF, KE, KF- but moderate foot drop- DF 2-/5; and PF 2-/5 LLE- HF/KE/KF 5-/5- after limb guard removed Mild tenderness R thigh-improved   Assessment/Plan: 1. Functional deficits which require 3+ hours per day of interdisciplinary therapy in a comprehensive inpatient rehab setting. Physiatrist is providing close team supervision and 24 hour management of active medical problems listed below. Physiatrist and rehab team continue to assess barriers to discharge/monitor patient progress toward functional and medical goals  Care Tool:  Bathing    Body parts bathed by patient: Right arm, Left arm, Abdomen, Chest, Front perineal area, Right upper leg, Left upper leg, Face, Buttocks, Right lower leg   Body parts bathed by helper: Buttocks, Right lower leg Body parts n/a: Left lower leg   Bathing assist Assist Level: Contact Guard/Touching assist (shower level seated)     Upper Body Dressing/Undressing Upper body dressing   What is the patient wearing?: Pull over  shirt    Upper body assist Assist Level: Set up assist    Lower Body Dressing/Undressing Lower body dressing      What is the patient wearing?: Pants, Incontinence brief     Lower body assist Assist for lower body dressing: Moderate Assistance - Patient 50 - 74%     Toileting Toileting    Toileting assist Assist for toileting: Moderate Assistance - Patient 50 - 74%     Transfers Chair/bed transfer  Transfers assist     Chair/bed transfer assist level: Supervision/Verbal cueing (squat<>pivot/lateral scoot)     Locomotion Ambulation   Ambulation assist   Ambulation activity did not occur: Safety/medical concerns (Unable to ambulated at this time secondary to new BKA with global deconditioning)           Walk 10 feet activity   Assist  Walk 10 feet activity did not occur: Safety/medical concerns        Walk 50 feet activity   Assist Walk 50 feet with 2 turns activity did not occur: Safety/medical concerns         Walk 150 feet activity   Assist Walk 150 feet activity did not occur: Safety/medical concerns         Walk 10 feet on uneven surface  activity   Assist Walk 10 feet on uneven surfaces activity did not occur: Safety/medical concerns         Wheelchair     Assist Is the patient using a wheelchair?: Yes Type of Wheelchair: Manual    Wheelchair assist level: Supervision/Verbal cueing Max wheelchair distance: 150    Wheelchair 50 feet with 2 turns activity    Assist        Assist Level: Supervision/Verbal cueing   Wheelchair 150 feet activity     Assist      Assist Level: Supervision/Verbal cueing   Blood pressure (!) 142/99, pulse 69, temperature 97.7 F (36.5 C), temperature source Oral, resp. rate 16, height 6' (1.829 m), weight 105.1 kg, SpO2 95 %.  Medical Problem List and Plan: 1. 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 Sharol Given regarding wound vac removal- OK to remove vac- stump shrinker and dry dressing   -wound vac was discontinued 3/4  -Exp discharge 3/21   2.  Antithrombotics: -DVT/anticoagulation:  Pharmaceutical: Heparin             -antiplatelet therapy: NA 3. Pain Management: Oxycodone prn for pain. No phantom pain so far  -muscle rub for R thigh started- pain improved 4. Mood/Behavior/Sleep: LCSW to follow for evaluation and support.              -antipsychotic agents: N/A  -3/8 Melatonin '3mg'$  HS started 5. Neuropsych/cognition: This patient is capable of making decisions on his own behalf. 6. Skin/Wound Care: Routine  pressure relief measures.  7. Fluids/Electrolytes/Nutrition: Strict I/O. Daily weights. Enforce 1200 cc FR.             --Reports on multiple supplements--d/c nephro.  --Continue Juven bid, Zinc and Vitamin-C to promote wound healing.  -Hyponatremia- improved on labs 3/4 to 131 8. Left calf staph positive cultures: IV antibiotics changed to Augmentin X 19 days per recs.  (stop 07/03/22) 9.  HTN: Monitor BP TID--continue Avapro (reduced to '75mg'$  2/27) and Demadex -STTS.   -3/7 irbesartan was previously decreased to allow for more volume to be reduced by nephrology, continue to monitor, BP mildy elevated but stable overall, hesitant to increase BP medications due to dialysis, if BP increases further may need increase irbesartan, Add hydralazine PRN SBP > 180  -3/8 BP with fair control, continue current regimen, parameters placed for PT/OT     06/25/2022    5:27 AM 06/25/2022    5:00 AM 06/24/2022    7:21 PM  Vitals with BMI  Weight  231 lbs 11 oz   BMI  [id number]   Systolic [id number]  [id number]  Diastolic 99  75  Pulse 69  69    10. T2DM: Hgb A1C- 7.4. Was on Glimepiride and Rubelsus PTA.  -- Monitor BS ac/hs and use SSI for tighter control.         -2/28 glargine increase to 8 units yesterday, SSI changed to sensitive. CBGs elevated, Monitor        -continue Insulin glargine w/ 2 units novolog TID AC ---> increase to 3u TID (3/1)  -3/4 Increase glargine to 12 units from 10 units  -3/6 increase novolog to 4 units TID AC  -3/8 glucose control better overall, CBG elevated this AM, consider slight increase in glargine if continues to have elevated AM CBG  CBG (last 3)  Recent Labs    06/24/22 2031 06/25/22 0704 06/25/22 1116  GLUCAP 125* 267* 103*     11. ESRD: Volume being managed with HD MWF. Is basically anuric- voided x2 since admission. Via fistula --Will schedule HD at the end of the day to help with tolerance of therapy.  12.  Leucocytosis due to LLE osteomyelitis- : Monitor for fevers and other  signs of infection.              --WBC resolving 16.4-->22.2-->16.7--> 18.5 2/29, monitor for signs of infection  -3/8 WBC down to 11.5, improved 13. Hyponatremia: Being managed with HD.  14. Cough: SOB with cough noted during exam-->he reports at baseline X 3 weeks and it due to dry air?             --? Due to fluid overload. Will order throat spray and CXR.              --encourage pulmonary hygiene.   -2/28 CXR with atelectasis vs PNA, continue augmentin as above, start mucinex, continue incentive spirometer  -3/1 resp panel negative, cough a little improved  Cough gradually improving 15. Right hip pain: Fall 3 weeks PTA. Will order Voltaren gel TID.  16. Loose stools- due to IV ABX per pt- might need a stool bulker/fiber-   -3/1 Fibercon started '625mg'$  BID  -3/8 LBM yesterday      LOS: 10 days A FACE TO FACE EVALUATION WAS PERFORMED  Jennye Boroughs 06/25/2022, 12:34 PM

## 2022-06-25 NOTE — Progress Notes (Signed)
Physical Therapy Session Note  Patient Details  Name: Scott Castillo MRN: [id number] Date of Birth: May 25, 1960  Today's Date: 06/25/2022 PT Individual Time: 0730-0811 PT Individual Time Calculation (min): 41 min   Short Term Goals: Week 1:  PT Short Term Goal 1 (Week 1): Patient will performed sit/stand with LRAD and ModA PT Short Term Goal 1 - Progress (Week 1): Met PT Short Term Goal 2 (Week 1): Patient will performed stand pivot transfer with LRAD and Mod/MaxA x1 PT Short Term Goal 2 - Progress (Week 1): Not met PT Short Term Goal 3 (Week 1): Patient will initiate gait training PT Short Term Goal 3 - Progress (Week 1): Not met Week 2:  PT Short Term Goal 1 (Week 2): Patient will perform bed<>chair transfer at [facility] level with slide board. PT Short Term Goal 2 (Week 2): Patient will manage WC brakes, leg rests, armrests with supervision/cues only for safe propulsion and WC transfers. PT Short Term Goal 3 (Week 2): Pt will self propel WC 100' with bil UE and no seated rest break with supervision only.  Skilled Therapeutic Interventions/Progress Updates:   Received pt semi-reclined in bed, pt agreeable to PT treatment, and reported pain 3/10 along incision of L residual limb. Session with emphasis on functional mobility/transfers, WC mobility, and simulated car transfers. Pt performed bed mobility with HOB elevated and use of bedrails with mod I x 2 trials throughout session. Donned limb guard with mod A but able to doff without assist. Pt performed x 2 squat<>pivot/lateral scoot transfers to/from bed with supervision during session - donned R shoe with max A for time management purposes. Pt requested cough medicine and sat in WC at sink and washed face while waiting for RN.  Pt performed WC mobility 68f x 1 and 262fx1 using BUE and supervision/mod I with 1 rest break due to fatigue. Pt requires assist for WC parts management including donning/doffing legrests/armrests due to decreased  hand/grip strength. In ortho gym, pt performed simulated car transfer from [place] via squat<>pivot/lateral scoot with close supervision. Worked on WC mobility up/down 1075famp x 2 trials with min A ascending and close supervision/CGA when descending ramp with multiple rest breaks due to fatigue. Discussed ordering wheelchair gloves for improved grip and to protect palms. Returned to room and returned to bed. Concluded session with pt semi-reclined in bed, needs within reach, and bed alarm on.   Therapy Documentation Precautions:  Precautions Precautions: Fall Precaution Comments: L BKA Other Brace: L limb protector Restrictions Weight Bearing Restrictions: No LLE Weight Bearing: Weight bearing as tolerated  Therapy/Group: Individual Therapy AnnAlfonse Alpers, DPT  06/25/2022, 6:53 AM

## 2022-06-25 NOTE — Progress Notes (Addendum)
[facility] Progress Note   Subjective:    Seen and examined patient at bedside. Working with PT currently. C/o ongoing cough. Denies fevers, chills, SOB, and CP. Plan for HD this afternoon.  Objective Vitals:   06/24/22 1401 06/24/22 1921 06/25/22 0500 06/25/22 0527  BP: 136/88 (!) 157/75  (!) 142/99  Pulse: 73 69  69  Resp: '17 16  16  '$ Temp: 98 F (36.7 C) 98.1 F (36.7 C)  97.7 F (36.5 C)  TempSrc: Oral Oral  Oral  SpO2: 94% 92%  95%  Weight:   105.1 kg   Height:       Physical Exam General: chronically ill appearing male in NAD Heart: S1,S2 No M/R/G Lungs: CTAB. No WOB.  Abdomen: NT, NABS Extremities: L BKA stump protector in place. chronic stasis changes RLE with trace edema Dialysis Access: L AVF +T/B  Filed Weights   06/23/22 1935 06/24/22 0426 06/25/22 0500  Weight: 106.2 kg 103.6 kg 105.1 kg    Intake/Output Summary (Last 24 hours) at 06/25/2022 1140 Last data filed at 06/25/2022 0804 Gross per 24 hour  Intake 360 ml  Output --  Net 360 ml    Additional Objective Labs: Basic Metabolic Panel: Recent Labs  Lab 06/18/22 1524 06/21/22 1253 06/23/22 1515  NA 130* 131* 129*  K 5.2* 4.9 5.2*  CL 94* 95* 92*  CO2 '24 22 23  '$ GLUCOSE 169* 194* 159*  BUN 72* 85* 77*  CREATININE 7.58* 9.21* 8.03*  CALCIUM 9.6 9.5 9.2  PHOS 4.5 4.9* 4.9*   Liver Function Tests: Recent Labs  Lab 06/18/22 1524 06/21/22 1253 06/23/22 1515  ALBUMIN 2.0* 2.1* 2.1*   No results for input(s): "LIPASE", "AMYLASE" in the last 168 hours. CBC: Recent Labs  Lab 06/18/22 1524 06/21/22 1253 06/23/22 1515  WBC 18.1* 15.8* 13.2*  HGB 9.3* 9.1* 8.7*  HCT 30.4* 29.6* 27.3*  MCV 88.9 89.2 87.8  PLT 280 252 257   Blood Culture    Component Value Date/Time   SDES TISSUE 06/11/2022 1449   SPECREQUEST LEFT BKA 06/11/2022 1449   CULT  06/11/2022 1449    RARE STAPHYLOCOCCUS AUREUS NO ANAEROBES ISOLATED Performed at [facility], [place] [address],  [place], [place] [address]    REPTSTATUS 06/17/2022 FINAL 06/11/2022 1449    Cardiac Enzymes: No results for input(s): "CKTOTAL", "CKMB", "CKMBINDEX", "TROPONINI" in the last 168 hours. CBG: Recent Labs  Lab 06/24/22 1152 06/24/22 1630 06/24/22 2031 06/25/22 0704 06/25/22 1116  GLUCAP 123* 129* 125* 267* 103*   Iron Studies: No results for input(s): "IRON", "TIBC", "TRANSFERRIN", "FERRITIN" in the last 72 hours. Lab Results  Component Value Date   INR 1.3 (H) 04/21/2021   INR 1.3 (H) 04/20/2021   INR 1.2 10/07/2020   Studies/Results: No results found.  Medications:   (feeding supplement) PROSource Plus  30 mL Oral BID BM   amoxicillin-clavulanate  1 tablet Oral q1800   vitamin C  1,000 mg Oral Daily   atorvastatin  40 mg Oral Daily   calcitRIOL  1.25 mcg Oral Q M,W,F-HD   carvedilol  25 mg Oral BID WC   darbepoetin (ARANESP) injection - DIALYSIS  200 mcg Subcutaneous Q Tue-1800   diclofenac Sodium  2 g Topical TID AC   Gerhardt's butt cream   Topical BID   guaiFENesin  600 mg Oral BID   heparin  5,000 Units Subcutaneous Q8H   insulin aspart  0-5 Units Subcutaneous QHS   insulin aspart  0-9  Units Subcutaneous TID with meals   insulin aspart  4 Units Subcutaneous TID WC   insulin glargine-yfgn  12 Units Subcutaneous Daily   irbesartan  75 mg Oral Daily   lactobacillus  1 g Oral TID WC   multivitamin  1 tablet Oral QHS   pantoprazole  40 mg Oral Daily   polycarbophil  625 mg Oral BID   sucroferric oxyhydroxide  1,000 mg Oral TID WC   torsemide  100 mg Oral Once per day on Sun Tue Thu Sat    Dialysis Orders: [facility] MWF 4h 29mn  400/800   108.5kg   2/2 bath  LFA AVF   - No Heparin  - rocaltrol 1.25 mcg po tiw - mircera 225 mcg IV  q 2wks, last 2/12, due 2/26 - last Hb 8.6 on 2/14  Assessment/Plan: 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 antibiotics per primary.  Hyponatremia - worsening in the setting of hypervolemia. managing with HD/UF. Fluid restriction  reinforced. Resolved, Na+ 130.  ESRD - on HD MWF. Next HD this afternoon. HTN/ volume - Last wt this AM at OP EDW with hyponatremia. Wt should be lower post BKA. Continue lowering volume at tolerated.  Decreased irbesartan to 75 mg PO daily  to allow for more volume to be removed. Glycemic control plays role in high gains. UF as tolerated and optimize volume with HD. 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 s/p 1 unit prbcs 2/22. Last ESA q.Mondays. Check iron panel when he completes ABX.  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 Cont po vdra and velphoro as binder.  DM2 per pmd. Chronic hyperglycemia, poorly controlled DMT2.  Nutrition - hypoalbuminemia- Continue protein supplement. Fluid restriction  CTobie Poet NP [facility] 06/25/2022,11:40 AM  LOS: 10 days    Pt seen, examined and agree w assess/plan as above with additions as indicated.  [facility] 06/25/2022, 4:00 PM

## 2022-06-25 NOTE — Progress Notes (Signed)
Occupational Therapy Session Note  Patient Details  Name: Scott Castillo MRN: [id number] Date of Birth: Jan 07, 1961  Today's Date: 06/25/2022 OT Individual Time: [id number] OT Individual Time Calculation (min): 70 min    Short Term Goals: Week 1:  OT Short Term Goal 1 (Week 1): Pt will stand from EOB with mod A OT Short Term Goal 1 - Progress (Week 1): Met OT Short Term Goal 2 (Week 1): Pt will don pants sit <> stand with mod A using LRAD OT Short Term Goal 2 - Progress (Week 1): Met OT Short Term Goal 3 (Week 1): Pt will complete stand pivot transfer with mod A OT Short Term Goal 3 - Progress (Week 1): Progressing toward goal  Skilled Therapeutic Interventions/Progress Updates:     Pt received in w/c with 3 out of 10 pain in residual limb incision. Rest and repositiong provided for pain relief  ADL: Pt completes ADL at overall w/c with set up for UB and MOD A sit to stand for LB Level. Skilled interventions include: cuing for hand placement during sit to stnads at sink (MOD A), edu on AE for donning pants (pt able ot thread BLE), application of barrier cream for skin integrity, education on skin inspection and signs of infection. BADL performed at sink as stated above with increased time for rest breaks d/t poor activity tolerance  Therapeutic activity Handouts, education and practice on the following subjects: Skin inspection- mirror provided and simulated looking at residual limb  Mirror therapy- practiced looking at reflection with therex and touching RLE Skin desensitization  Pt left at end of session in w/c pt refusing safety belt stating "I wont get up without staff with me", call light in reach and all needs met   Therapy Documentation Precautions:  Precautions Precautions: Fall Precaution Comments: L BKA Other Brace: L limb protector Restrictions Weight Bearing Restrictions: No LLE Weight Bearing: Weight bearing as tolerated General:   Therapy/Group: Individual  Therapy  Tonny Branch 06/25/2022, 6:54 AM

## 2022-06-26 DIAGNOSIS — Z89512 Acquired absence of left leg below knee: Secondary | ICD-10-CM

## 2022-06-26 DIAGNOSIS — M869 Osteomyelitis, unspecified: Secondary | ICD-10-CM

## 2022-06-26 DIAGNOSIS — E669 Obesity, unspecified: Secondary | ICD-10-CM

## 2022-06-26 DIAGNOSIS — Z992 Dependence on renal dialysis: Secondary | ICD-10-CM

## 2022-06-26 DIAGNOSIS — N186 End stage renal disease: Secondary | ICD-10-CM

## 2022-06-26 DIAGNOSIS — E1169 Type 2 diabetes mellitus with other specified complication: Secondary | ICD-10-CM

## 2022-06-26 LAB — GLUCOSE, CAPILLARY
Glucose-Capillary: 110 mg/dL — ABNORMAL HIGH (ref 70–99)
Glucose-Capillary: 178 mg/dL — ABNORMAL HIGH (ref 70–99)
Glucose-Capillary: 131 mg/dL — ABNORMAL HIGH (ref 70–99)
Glucose-Capillary: 156 mg/dL — ABNORMAL HIGH (ref 70–99)

## 2022-06-26 NOTE — Progress Notes (Signed)
[facility] Progress Note   Subjective:    Seen and examined patient at bedside. No issues. Tolerated yesterday's HD with net UF 5L. Next HD 3/11.  Objective Vitals:   06/25/22 1942 06/26/22 0349 06/26/22 0410 06/26/22 1258  BP: 135/87 (!) 134/91  136/87  Pulse: 84 77  84  Resp: '18 16  16  '$ Temp: 97.7 F (36.5 C) 97.7 F (36.5 C)  97.7 F (36.5 C)  TempSrc: Oral Oral  Oral  SpO2: 96% 95%  98%  Weight:   104.2 kg   Height:       Physical Exam General: chronically ill appearing male in NAD Heart: S1,S2 No M/R/G Lungs: CTAB. No WOB.  Abdomen: NT, NABS Extremities: L BKA stump protector in place. chronic stasis changes RLE with trace edema Dialysis Access: L AVF +T/B  Filed Weights   06/25/22 1322 06/25/22 1829 06/26/22 0410  Weight: 107.7 kg 102.3 kg 104.2 kg    Intake/Output Summary (Last 24 hours) at 06/26/2022 1302 Last data filed at 06/26/2022 0734 Gross per 24 hour  Intake 600 ml  Output 5000 ml  Net -4400 ml    Additional Objective Labs: Basic Metabolic Panel: Recent Labs  Lab 06/21/22 1253 06/23/22 1515 06/25/22 1209  NA 131* 129* 130*  K 4.9 5.2* 5.1  CL 95* 92* 92*  CO2 '22 23 25  '$ GLUCOSE 194* 159* 98  BUN 85* 77* 73*  CREATININE 9.21* 8.03* 7.81*  CALCIUM 9.5 9.2 9.2  PHOS 4.9* 4.9* 5.2*   Liver Function Tests: Recent Labs  Lab 06/21/22 1253 06/23/22 1515 06/25/22 1209  ALBUMIN 2.1* 2.1* 2.2*   No results for input(s): "LIPASE", "AMYLASE" in the last 168 hours. CBC: Recent Labs  Lab 06/21/22 1253 06/23/22 1515 06/25/22 1209  WBC 15.8* 13.2* 11.5*  HGB 9.1* 8.7* 8.5*  HCT 29.6* 27.3* 26.9*  MCV 89.2 87.8 88.5  PLT 252 257 225   Blood Culture    Component Value Date/Time   SDES TISSUE 06/11/2022 1449   SPECREQUEST LEFT BKA 06/11/2022 1449   CULT  06/11/2022 1449    RARE STAPHYLOCOCCUS AUREUS NO ANAEROBES ISOLATED Performed at [facility], [place] [address], [place], [place] [address]    REPTSTATUS  06/17/2022 FINAL 06/11/2022 1449    Cardiac Enzymes: No results for input(s): "CKTOTAL", "CKMB", "CKMBINDEX", "TROPONINI" in the last 168 hours. CBG: Recent Labs  Lab 06/25/22 1116 06/25/22 1848 06/25/22 2122 06/26/22 0523 06/26/22 1133  GLUCAP 103* 101* 162* 110* 178*   Iron Studies: No results for input(s): "IRON", "TIBC", "TRANSFERRIN", "FERRITIN" in the last 72 hours. Lab Results  Component Value Date   INR 1.3 (H) 04/21/2021   INR 1.3 (H) 04/20/2021   INR 1.2 10/07/2020   Studies/Results: No results found.  Medications:   (feeding supplement) PROSource Plus  30 mL Oral BID BM   amoxicillin-clavulanate  1 tablet Oral q1800   vitamin C  1,000 mg Oral Daily   atorvastatin  40 mg Oral Daily   calcitRIOL  1.25 mcg Oral Q M,W,F-HD   carvedilol  25 mg Oral BID WC   darbepoetin (ARANESP) injection - DIALYSIS  200 mcg Subcutaneous Q Tue-1800   diclofenac Sodium  2 g Topical TID AC   Gerhardt's butt cream   Topical BID   guaiFENesin  600 mg Oral BID   heparin  5,000 Units Subcutaneous Q8H   insulin aspart  0-5 Units Subcutaneous QHS   insulin aspart  0-9 Units Subcutaneous TID with meals  insulin aspart  4 Units Subcutaneous TID WC   insulin glargine-yfgn  12 Units Subcutaneous Daily   irbesartan  75 mg Oral Daily   lactobacillus  1 g Oral TID WC   melatonin  3 mg Oral QHS   multivitamin  1 tablet Oral QHS   pantoprazole  40 mg Oral Daily   polycarbophil  625 mg Oral BID   sucroferric oxyhydroxide  1,000 mg Oral TID WC   torsemide  100 mg Oral Once per day on Sun Tue Thu Sat    Dialysis Orders: [facility] MWF 4h 18mn  400/800   108.5kg   2/2 bath  LFA AVF   - No Heparin  - rocaltrol 1.25 mcg po tiw - mircera 225 mcg IV  q 2wks, last 2/12, due 2/26 - last Hb 8.6 on 2/14  Assessment/Plan: 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 antibiotics per primary.  Hyponatremia - worsening in the setting of hypervolemia. managing with HD/UF. Fluid restriction reinforced.  Resolved, Na+ 130.  ESRD - on HD MWF. Next HD 06/28/22. K+ 5.1, will change to renal diet. Monitor trend. HTN/ volume - Last wt this AM at OP EDW with hyponatremia. Wt should be lower post BKA. Continue lowering volume at tolerated.  Decreased irbesartan to 75 mg PO daily  to allow for more volume to be removed. Glycemic control plays role in high gains. UF as tolerated and optimize volume with HD. 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 s/p 1 unit prbcs 2/22. Last ESA q.Mondays. Check iron panel when he completes ABX.  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 Cont po vdra and velphoro as binder.  DM2 per pmd. Chronic hyperglycemia, poorly controlled DMT2.  Nutrition - hypoalbuminemia- Continue protein supplement. Fluid restriction    CTobie Poet NP [facility] 06/26/2022,1:02 PM  LOS: 11 days

## 2022-06-26 NOTE — Progress Notes (Signed)
PROGRESS NOTE   Subjective/Complaints:  Slept pretty well. HD done earlier which he was happy with. Pain seems controlled    Review of Systems  Constitutional:  Negative for chills, fever and malaise/fatigue.  HENT:  Positive for congestion.   Respiratory:  Negative for cough, sputum production and shortness of breath.   Cardiovascular:  Negative for chest pain.  Gastrointestinal:  Negative for abdominal pain, nausea and vomiting.  Musculoskeletal:  Positive for myalgias. Negative for joint pain.  Neurological:  Positive for weakness. Negative for dizziness and headaches.  Psychiatric/Behavioral:  The patient does not have insomnia.     Objective:   No results found.    Recent Labs    06/23/22 1515 06/25/22 1209  WBC 13.2* 11.5*  HGB 8.7* 8.5*  HCT 27.3* 26.9*  PLT 257 225    Recent Labs    06/23/22 1515 06/25/22 1209  NA 129* 130*  K 5.2* 5.1  CL 92* 92*  CO2 23 25  GLUCOSE 159* 98  BUN 77* 73*  CREATININE 8.03* 7.81*  CALCIUM 9.2 9.2     Intake/Output Summary (Last 24 hours) at 06/26/2022 0935 Last data filed at 06/26/2022 0734 Gross per 24 hour  Intake 600 ml  Output 5000 ml  Net -4400 ml         Physical Exam: Vital Signs Blood pressure (!) 134/91, pulse 77, temperature 97.7 F (36.5 C), temperature source Oral, resp. rate 16, height 6' (1.829 m), weight 104.2 kg, SpO2 95 %.    Constitutional: No distress . Vital signs reviewed. HEENT: NCAT, EOMI, oral membranes moist Neck: supple Cardiovascular: RRR without murmur. No JVD    Respiratory/Chest: CTA Bilaterally without wheezes or rales. Normal effort    GI/Abdomen: BS +, non-tender, non-distended Ext: no clubbing, cyanosis, decreased stump edema Psych: pleasant and cooperative  Skin: L BKA- incision CDI/shrinker- please see image, left AVF Venous stasis changes and abrasions distal RLE Neuro:    Alert and awake, CN 2-12 grossly  intact, follows commands    Comments: Decreased to light touch from R mid shin downwards;  Musculoskeletal:  UE strength 5/5 in Biceps, triceps, WE, grip and FA B/L RLE- 5/5 in HF, KE, KF- but moderate foot drop- DF 1+ to 2-/5; and PF 1+ to 2-/5 LLE- HF/KE/KF 5-/5- after limb guard removed Mild tenderness R thigh-improved     Assessment/Plan: 1. Functional deficits which require 3+ hours per day of interdisciplinary therapy in a comprehensive inpatient rehab setting. Physiatrist is providing close team supervision and 24 hour management of active medical problems listed below. Physiatrist and rehab team continue to assess barriers to discharge/monitor patient progress toward functional and medical goals  Care Tool:  Bathing    Body parts bathed by patient: Right arm, Left arm, Abdomen, Chest, Front perineal area, Right upper leg, Left upper leg, Face, Buttocks, Right lower leg   Body parts bathed by helper: Buttocks, Right lower leg Body parts n/a: Left lower leg   Bathing assist Assist Level: Contact Guard/Touching assist (shower level seated)     Upper Body Dressing/Undressing Upper body dressing   What is the patient wearing?: Pull over shirt    Upper body assist  Assist Level: Set up assist    Lower Body Dressing/Undressing Lower body dressing      What is the patient wearing?: Pants, Incontinence brief     Lower body assist Assist for lower body dressing: Moderate Assistance - Patient 50 - 74%     Toileting Toileting    Toileting assist Assist for toileting: Moderate Assistance - Patient 50 - 74%     Transfers Chair/bed transfer  Transfers assist     Chair/bed transfer assist level: Supervision/Verbal cueing (squat<>pivot/lateral scoot)     Locomotion Ambulation   Ambulation assist   Ambulation activity did not occur: Safety/medical concerns (Unable to ambulated at this time secondary to new BKA with global deconditioning)          Walk 10  feet activity   Assist  Walk 10 feet activity did not occur: Safety/medical concerns        Walk 50 feet activity   Assist Walk 50 feet with 2 turns activity did not occur: Safety/medical concerns         Walk 150 feet activity   Assist Walk 150 feet activity did not occur: Safety/medical concerns         Walk 10 feet on uneven surface  activity   Assist Walk 10 feet on uneven surfaces activity did not occur: Safety/medical concerns         Wheelchair     Assist Is the patient using a wheelchair?: Yes Type of Wheelchair: Manual    Wheelchair assist level: Supervision/Verbal cueing Max wheelchair distance: 150    Wheelchair 50 feet with 2 turns activity    Assist        Assist Level: Supervision/Verbal cueing   Wheelchair 150 feet activity     Assist      Assist Level: Supervision/Verbal cueing   Blood pressure (!) 134/91, pulse 77, temperature 97.7 F (36.5 C), temperature source Oral, resp. rate 16, height 6' (1.829 m), weight 104.2 kg, SpO2 95 %.  Medical Problem List and Plan: 1. Functional deficits secondary to L BKA due to osteomyelitis of LLE             -patient may  shower after VAC removed             -ELOS/Goals: 10-14 days supervision at w/c level  -Continue CIR therapies including PT, OT   -Asked Dr. Sharol Given regarding wound vac removal- OK to remove vac- stump shrinker and dry dressing   -wound vac was discontinued 3/4  -Exp discharge 3/21   2.  Antithrombotics: -DVT/anticoagulation:  Pharmaceutical: Heparin             -antiplatelet therapy: NA 3. Pain Management: Oxycodone prn for pain. No phantom pain so far  -muscle rub for R thigh started- pain improved 4. Mood/Behavior/Sleep: LCSW to follow for evaluation and support.              -antipsychotic agents: N/A  -3/8 Melatonin '3mg'$  HS started 5. Neuropsych/cognition: This patient is capable of making decisions on his own behalf. 6. Skin/Wound Care: Routine  pressure relief measures.   -continue shrinker to left BK stump 7. Fluids/Electrolytes/Nutrition: Strict I/O. Daily weights. Enforce 1200 cc FR.             --Reports on multiple supplements--d/c nephro.  --Continue Juven bid, Zinc and Vitamin-C to promote wound healing.  -Hyponatremia- improved on labs 3/4 to 131 8. Left calf staph positive cultures: IV antibiotics changed to Augmentin X 19 days per recs.  (  stop 07/03/22) 9. HTN: Monitor BP TID--continue Avapro (reduced to '75mg'$  2/27) and Demadex -STTS.   -3/7 irbesartan was previously decreased to allow for more volume to be reduced by nephrology, continue to monitor, BP mildy elevated but stable overall, hesitant to increase BP medications due to dialysis, if BP increases further may need increase irbesartan, Add hydralazine PRN SBP > 180  -3/9 BP with fair control although DBP still up, continue current regimen, parameters placed for PT/OT     06/26/2022    4:10 AM 06/26/2022    3:49 AM 06/25/2022    7:42 PM  Vitals with BMI  Weight 229 lbs 12 oz    BMI [id number]    Systolic  [id number]  Diastolic  91 87  Pulse  77 84    10. T2DM: Hgb A1C- 7.4. Was on Glimepiride and Rubelsus PTA.  -- Monitor BS ac/hs and use SSI for tighter control.         -2/28 glargine increase to 8 units yesterday, SSI changed to sensitive. CBGs elevated, Monitor        -continue Insulin glargine w/ 2 units novolog TID AC ---> increase to 3u TID (3/1)  -3/4 Increase glargine to 12 units from 10 units  -3/6 increase novolog to 4 units TID AC  -3/9 much improved control at present -- no changes  CBG (last 3)  Recent Labs    06/25/22 1848 06/25/22 2122 06/26/22 0523  GLUCAP 101* 162* 110*     11. ESRD: Volume being managed with HD MWF. Is basically anuric- voided x2 since admission. Via fistula --Will schedule HD at the end of the day to help with tolerance of therapy.  12.  Leucocytosis due to LLE osteomyelitis- : Monitor for fevers and other signs of infection.               --WBC resolving 16.4-->22.2-->16.7--> 18.5 2/29, monitor for signs of infection  -3/8 WBC down to 11.5, improved 13. Hyponatremia: Being managed with HD.  14. Cough: SOB with cough noted during exam-->he reports at baseline X 3 weeks and it due to dry air?             --? Due to fluid overload. Will order throat spray and CXR.              --encourage pulmonary hygiene.   -2/28 CXR with atelectasis vs PNA, continue augmentin as above, start mucinex, continue incentive spirometer  -3/9 improved 15. Right hip pain: Fall 3 weeks PTA. Will order Voltaren gel TID.  16. Loose stools- due to IV ABX per pt- might need a stool bulker/fiber-   -3/1 Fibercon started '625mg'$  BID  -3/8 LBM 3/7      LOS: 11 days A FACE TO FACE EVALUATION WAS PERFORMED  Meredith Staggers 06/26/2022, 9:35 AM

## 2022-06-27 LAB — GLUCOSE, CAPILLARY
Glucose-Capillary: 209 mg/dL — ABNORMAL HIGH (ref 70–99)
Glucose-Capillary: 90 mg/dL (ref 70–99)
Glucose-Capillary: 286 mg/dL — ABNORMAL HIGH (ref 70–99)
Glucose-Capillary: 109 mg/dL — ABNORMAL HIGH (ref 70–99)

## 2022-06-27 MED ORDER — INSULIN GLARGINE-YFGN 100 UNIT/ML ~~LOC~~ SOLN
4.0000 [IU] | Freq: Every day | SUBCUTANEOUS | Status: DC
  Administered 2022-06-27 – 2022-06-28 (×2): 4 [IU] via SUBCUTANEOUS
  Filled 2022-06-27 (×3): qty 0.04

## 2022-06-27 MED ORDER — PENTAFLUOROPROP-TETRAFLUOROETH EX AERO: 1.0000 | INHALATION_SPRAY | CUTANEOUS | Status: DC | PRN

## 2022-06-27 MED ORDER — ALTEPLASE 2 MG IJ SOLR: 2.0000 mg | Freq: Once | INTRAMUSCULAR | Status: DC | PRN

## 2022-06-27 MED ORDER — GABAPENTIN 100 MG PO CAPS
100.0000 mg | ORAL_CAPSULE | Freq: Every day | ORAL | Status: DC
  Administered 2022-06-27: 100 mg via ORAL
  Filled 2022-06-27: qty 1

## 2022-06-27 MED ORDER — LIDOCAINE HCL (PF) 1 % IJ SOLN: 5.0000 mL | INTRAMUSCULAR | Status: DC | PRN

## 2022-06-27 MED ORDER — CHLORHEXIDINE GLUCONATE CLOTH 2 % EX PADS
6.0000 | MEDICATED_PAD | Freq: Every day | CUTANEOUS | Status: DC
  Administered 2022-06-27 – 2022-07-03 (×3): 6 via TOPICAL

## 2022-06-27 MED ORDER — INSULIN GLARGINE-YFGN 100 UNIT/ML ~~LOC~~ SOLN: 4.0000 [IU] | Freq: Every day | SUBCUTANEOUS | Status: DC

## 2022-06-27 MED ORDER — INSULIN GLARGINE-YFGN 100 UNIT/ML ~~LOC~~ SOLN
10.0000 [IU] | Freq: Every day | SUBCUTANEOUS | Status: DC
  Administered 2022-06-28 – 2022-06-29 (×2): 10 [IU] via SUBCUTANEOUS
  Filled 2022-06-27 (×2): qty 0.1

## 2022-06-27 MED ORDER — INSULIN GLARGINE-YFGN 100 UNIT/ML ~~LOC~~ SOLN: 10.0000 [IU] | Freq: Every day | SUBCUTANEOUS | Status: DC

## 2022-06-27 MED ORDER — LIDOCAINE-PRILOCAINE 2.5-2.5 % EX CREA: 1.0000 | TOPICAL_CREAM | CUTANEOUS | Status: DC | PRN

## 2022-06-27 NOTE — Progress Notes (Addendum)
[facility] Progress Note   Subjective:    Seen and examined patient at bedside. He denied any issues overnight. Next HD 06/28/22.  Objective Vitals:   06/26/22 0410 06/26/22 1258 06/26/22 1940 06/27/22 0318  BP:  136/87 132/85 (!) 143/68  Pulse:  84 62 94  Resp:  '16 18 19  '$ Temp:  97.7 F (36.5 C) 98 F (36.7 C) 98.3 F (36.8 C)  TempSrc:  Oral Oral Oral  SpO2:  98% 98% 94%  Weight: 104.2 kg     Height:       Physical Exam General: chronically ill appearing male in NAD Heart: S1,S2 No M/R/G Lungs: CTAB. No WOB.  Abdomen: NT, NABS Extremities: L BKA stump protector in place. chronic stasis changes RLE with trace edema Dialysis Access: L AVF +T/B   Filed Weights   06/25/22 1322 06/25/22 1829 06/26/22 0410  Weight: 107.7 kg 102.3 kg 104.2 kg    Intake/Output Summary (Last 24 hours) at 06/27/2022 1100 Last data filed at 06/27/2022 [id number] Gross per 24 hour  Intake 870 ml  Output --  Net 870 ml    Additional Objective Labs: Basic Metabolic Panel: Recent Labs  Lab 06/21/22 1253 06/23/22 1515 06/25/22 1209  NA 131* 129* 130*  K 4.9 5.2* 5.1  CL 95* 92* 92*  CO2 '22 23 25  '$ GLUCOSE 194* 159* 98  BUN 85* 77* 73*  CREATININE 9.21* 8.03* 7.81*  CALCIUM 9.5 9.2 9.2  PHOS 4.9* 4.9* 5.2*   Liver Function Tests: Recent Labs  Lab 06/21/22 1253 06/23/22 1515 06/25/22 1209  ALBUMIN 2.1* 2.1* 2.2*   No results for input(s): "LIPASE", "AMYLASE" in the last 168 hours. CBC: Recent Labs  Lab 06/21/22 1253 06/23/22 1515 06/25/22 1209  WBC 15.8* 13.2* 11.5*  HGB 9.1* 8.7* 8.5*  HCT 29.6* 27.3* 26.9*  MCV 89.2 87.8 88.5  PLT 252 257 225   Blood Culture    Component Value Date/Time   SDES TISSUE 06/11/2022 1449   SPECREQUEST LEFT BKA 06/11/2022 1449   CULT  06/11/2022 1449    RARE STAPHYLOCOCCUS AUREUS NO ANAEROBES ISOLATED Performed at [facility], [place] [address] [place], [place] [address]    REPTSTATUS 06/17/2022 FINAL 06/11/2022 1449     Cardiac Enzymes: No results for input(s): "CKTOTAL", "CKMB", "CKMBINDEX", "TROPONINI" in the last 168 hours. CBG: Recent Labs  Lab 06/26/22 0523 06/26/22 1133 06/26/22 1625 06/26/22 2029 06/27/22 0520  GLUCAP 110* 178* 131* 156* 209*   Iron Studies: No results for input(s): "IRON", "TIBC", "TRANSFERRIN", "FERRITIN" in the last 72 hours. Lab Results  Component Value Date   INR 1.3 (H) 04/21/2021   INR 1.3 (H) 04/20/2021   INR 1.2 10/07/2020   Studies/Results: No results found.  Medications:   (feeding supplement) PROSource Plus  30 mL Oral BID BM   amoxicillin-clavulanate  1 tablet Oral q1800   vitamin C  1,000 mg Oral Daily   atorvastatin  40 mg Oral Daily   calcitRIOL  1.25 mcg Oral Q M,W,F-HD   carvedilol  25 mg Oral BID WC   darbepoetin (ARANESP) injection - DIALYSIS  200 mcg Subcutaneous Q Tue-1800   diclofenac Sodium  2 g Topical TID AC   gabapentin  100 mg Oral QHS   Gerhardt's butt cream   Topical BID   guaiFENesin  600 mg Oral BID   heparin  5,000 Units Subcutaneous Q8H   insulin aspart  0-5 Units Subcutaneous QHS   insulin aspart  0-9 Units Subcutaneous  TID with meals   insulin aspart  4 Units Subcutaneous TID WC   [START ON 06/28/2022] insulin glargine-yfgn  10 Units Subcutaneous Daily   And   insulin glargine-yfgn  4 Units Subcutaneous Daily   irbesartan  75 mg Oral Daily   lactobacillus  1 g Oral TID WC   melatonin  3 mg Oral QHS   multivitamin  1 tablet Oral QHS   pantoprazole  40 mg Oral Daily   polycarbophil  625 mg Oral BID   sucroferric oxyhydroxide  1,000 mg Oral TID WC   torsemide  100 mg Oral Once per day on Sun Tue Thu Sat    Dialysis Orders: [facility] MWF 4h 33mn  400/800   108.5kg   2/2 bath  LFA AVF   - No Heparin  - rocaltrol 1.25 mcg po tiw - mircera 225 mcg IV  q 2wks, last 2/12, due 2/26 - last Hb 8.6 on 2/14   Assessment/Plan: 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 antibiotics per primary.  ESRD - on HD MWF. Next HD  06/28/22. Last K+ 5.1, changed to renal diet. Monitor trend. HTN/ volume - Last wt this AM at OP EDW with hyponatremia. Wt should be lower post BKA. Continue lowering volume at tolerated.  Decreased irbesartan to 75 mg PO daily  to allow for more volume to be removed. Glycemic control plays role in high gains. UF as tolerated and optimize volume with HD. 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 s/p 1 unit prbcs 2/22. Last ESA q.Mondays. Check iron panel when he completes ABX.  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 Cont po vdra and velphoro as binder.  DM2 per pmd. Chronic hyperglycemia, poorly controlled DMT2.  Nutrition - hypoalbuminemia- Continue protein supplement. Fluid restriction  CTobie Poet NP [facility] 06/27/2022,11:00 AM  LOS: 12 days    Pt seen, examined and agree w A/P as above.  RKelly Splinter MD 06/27/2022, 4:50 PM

## 2022-06-27 NOTE — Progress Notes (Addendum)
PROGRESS NOTE   Subjective/Complaints:  Says he started experiencing numbness and mild pain in his LUE overnight. Says it's from a pinched nerve on the RIGHT side of his neck. Has experienced it before. Left leg with mild discomfort also    Review of Systems  Constitutional:  Negative for chills, fever and malaise/fatigue.  HENT:  Positive for congestion.   Respiratory:  Negative for cough, sputum production and shortness of breath.   Cardiovascular:  Negative for chest pain.  Gastrointestinal:  Negative for abdominal pain, nausea and vomiting.  Musculoskeletal:  Positive for myalgias. Negative for joint pain.  Neurological:  Positive for sensory change and weakness. Negative for dizziness and headaches.  Psychiatric/Behavioral:  The patient does not have insomnia.     Objective:   No results found.    Recent Labs    06/25/22 1209  WBC 11.5*  HGB 8.5*  HCT 26.9*  PLT 225    Recent Labs    06/25/22 1209  NA 130*  K 5.1  CL 92*  CO2 25  GLUCOSE 98  BUN 73*  CREATININE 7.81*  CALCIUM 9.2     Intake/Output Summary (Last 24 hours) at 06/27/2022 0848 Last data filed at 06/27/2022 [id number] Gross per 24 hour  Intake 870 ml  Output --  Net 870 ml         Physical Exam: Vital Signs Blood pressure (!) 143/68, pulse 94, temperature 98.3 F (36.8 C), temperature source Oral, resp. rate 19, height 6' (1.829 m), weight 104.2 kg, SpO2 94 %.    Constitutional: No distress . Vital signs reviewed. HEENT: NCAT, EOMI, oral membranes moist Neck: supple Cardiovascular: RRR without murmur. No JVD    Respiratory/Chest: CTA Bilaterally without wheezes or rales. Normal effort    GI/Abdomen: BS +, non-tender, non-distended Ext: no clubbing, cyanosis, or edema Psych: pleasant and cooperative  Skin: L BKA- incision CDI/shrinker- please see image, left AVF Venous stasis changes and abrasions distal RLE Neuro:    Alert  and awake, CN 2-12 grossly intact, follows commands    Comments: Decreased to light touch from R mid shin downwards;  Musculoskeletal:  UE strength 5/5 in Biceps, triceps, WE, grip and FA B/L. Strength symmetrical and unchanged in BUE. Subjective sensory loss LUE? RLE- 5/5 in HF, KE, KF- but moderate foot drop- DF 1+ to 2-/5; and PF 1+ to 2-/5 LLE- HF/KE/KF 5-/5- after limb guard removed Mild tenderness R thigh-improved     Assessment/Plan: 1. Functional deficits which require 3+ hours per day of interdisciplinary therapy in a comprehensive inpatient rehab setting. Physiatrist is providing close team supervision and 24 hour management of active medical problems listed below. Physiatrist and rehab team continue to assess barriers to discharge/monitor patient progress toward functional and medical goals  Care Tool:  Bathing    Body parts bathed by patient: Right arm, Left arm, Abdomen, Chest, Front perineal area, Right upper leg, Left upper leg, Face, Buttocks, Right lower leg   Body parts bathed by helper: Buttocks, Right lower leg Body parts n/a: Left lower leg   Bathing assist Assist Level: Contact Guard/Touching assist (shower level seated)     Upper Body Dressing/Undressing Upper body dressing  What is the patient wearing?: Pull over shirt    Upper body assist Assist Level: Set up assist    Lower Body Dressing/Undressing Lower body dressing      What is the patient wearing?: Pants, Incontinence brief     Lower body assist Assist for lower body dressing: Moderate Assistance - Patient 50 - 74%     Toileting Toileting    Toileting assist Assist for toileting: Moderate Assistance - Patient 50 - 74%     Transfers Chair/bed transfer  Transfers assist     Chair/bed transfer assist level: Supervision/Verbal cueing (squat<>pivot/lateral scoot)     Locomotion Ambulation   Ambulation assist   Ambulation activity did not occur: Safety/medical concerns (Unable  to ambulated at this time secondary to new BKA with global deconditioning)          Walk 10 feet activity   Assist  Walk 10 feet activity did not occur: Safety/medical concerns        Walk 50 feet activity   Assist Walk 50 feet with 2 turns activity did not occur: Safety/medical concerns         Walk 150 feet activity   Assist Walk 150 feet activity did not occur: Safety/medical concerns         Walk 10 feet on uneven surface  activity   Assist Walk 10 feet on uneven surfaces activity did not occur: Safety/medical concerns         Wheelchair     Assist Is the patient using a wheelchair?: Yes Type of Wheelchair: Manual    Wheelchair assist level: Supervision/Verbal cueing Max wheelchair distance: 150    Wheelchair 50 feet with 2 turns activity    Assist        Assist Level: Supervision/Verbal cueing   Wheelchair 150 feet activity     Assist      Assist Level: Supervision/Verbal cueing   Blood pressure (!) 143/68, pulse 94, temperature 98.3 F (36.8 C), temperature source Oral, resp. rate 19, height 6' (1.829 m), weight 104.2 kg, SpO2 94 %.  Medical Problem List and Plan: 1. 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  Antithrombotics: -DVT/anticoagulation:  Pharmaceutical: Heparin             -antiplatelet therapy: NA 3. Pain Management: Oxycodone prn for pain. No phantom pain so far  -muscle rub for R thigh started- pain improved  3/10 -will start low dose gabapentin for LUE pain (chronic). Pt in agreement   -see no record of recent CT/MRI of neck 4. Mood/Behavior/Sleep: LCSW to follow for evaluation and support.              -antipsychotic agents: N/A  -3/8 Melatonin '3mg'$  HS  started 5. Neuropsych/cognition: This patient is capable of making decisions on his own behalf. 6. Skin/Wound Care: Routine pressure relief measures.   -continue shrinker to left BK stump 7. Fluids/Electrolytes/Nutrition: Strict I/O. Daily weights. Enforce 1200 cc FR.             --Reports on multiple supplements--d/c nephro.  --Continue Juven bid, Zinc and Vitamin-C to promote wound healing.  -Hyponatremia- improved on  labs 3/4 to 131 8. Left calf staph positive cultures: IV antibiotics changed to Augmentin X 19 days per recs.  (stop 07/03/22) 9. HTN: Monitor BP TID--continue Avapro (reduced to '75mg'$  2/27) and Demadex -STTS.   -3/7 irbesartan was previously decreased to allow for more volume to be reduced by nephrology, continue to monitor, BP mildy elevated but stable overall, hesitant to increase BP medications due to dialysis, if BP increases further may need increase irbesartan, Add hydralazine PRN SBP > 180  -3/9-10 BP with fair control, continue current regimen, parameters placed for PT/OT     06/27/2022    3:18 AM 06/26/2022    7:40 PM 06/26/2022   12:58 PM  Vitals with BMI  Systolic [id number]  Diastolic 68 85 87  Pulse 94 62 84    10. T2DM: Hgb A1C- 7.4. Was on Glimepiride and Rubelsus PTA.  -- Monitor BS ac/hs and use SSI for tighter control.         -2/28 glargine increase to 8 units yesterday, SSI changed to sensitive. CBGs elevated, Monitor        -continue Insulin glargine w/ 2 units novolog TID AC ---> increase to 3u TID (3/1)  -3/4 Increase glargine to 12 units from 10 units  -3/6 increase novolog to 4 units TID AC  -3/10 cbg's higher in AM, probably would benefit from bid glarcine   -will change to 10 qam and 4u qpm and observe   -continue scheduled novolog  CBG (last 3)  Recent Labs    06/26/22 1625 06/26/22 2029 06/27/22 0520  GLUCAP 131* 156* 209*     11. ESRD: Volume being managed with HD MWF. Is basically anuric- voided x2 since admission. Via  fistula --Will schedule HD at the end of the day to help with tolerance of therapy.  12.  Leucocytosis due to LLE osteomyelitis- : Monitor for fevers and other signs of infection.              --WBC resolving 16.4-->22.2-->16.7--> 18.5 2/29, monitor for signs of infection  -3/8 WBC down to 11.5, improved 13. Hyponatremia: Being managed with HD.  14. Cough: SOB with cough noted during exam-->he reports at baseline X 3 weeks and it due to dry air?             --? Due to fluid overload. Will order throat spray and CXR.              --encourage pulmonary hygiene.   -2/28 CXR with atelectasis vs PNA, continue augmentin as above, start mucinex, continue incentive spirometer  -3/9-10 improved 15. Right hip pain: Fall 3 weeks PTA. Will order Voltaren gel TID.  16. Loose stools- due to IV ABX per pt- might need a stool bulker/fiber-   -3/1 Fibercon started '625mg'$  BID  -3/8 LBM 3/9      LOS: 12 days A FACE TO FACE EVALUATION WAS PERFORMED  Meredith Staggers 06/27/2022, 8:48 AM

## 2022-06-28 LAB — CBC WITH DIFFERENTIAL/PLATELET
Abs Immature Granulocytes: 0.05 10*3/uL (ref 0.00–0.07)
Basophils Absolute: 0.1 10*3/uL (ref 0.0–0.1)
Basophils Relative: 1 %
Eosinophils Absolute: 0.2 10*3/uL (ref 0.0–0.5)
Eosinophils Relative: 2 %
HCT: 26.9 % — ABNORMAL LOW (ref 39.0–52.0)
Hemoglobin: 8 g/dL — ABNORMAL LOW (ref 13.0–17.0)
Immature Granulocytes: 1 %
Lymphocytes Relative: 7 %
Lymphs Abs: 0.6 10*3/uL — ABNORMAL LOW (ref 0.7–4.0)
MCH: 27 pg (ref 26.0–34.0)
MCHC: 29.7 g/dL — ABNORMAL LOW (ref 30.0–36.0)
MCV: 90.9 fL (ref 80.0–100.0)
Monocytes Absolute: 0.6 10*3/uL (ref 0.1–1.0)
Monocytes Relative: 7 %
Neutro Abs: 7.3 10*3/uL (ref 1.7–7.7)
Neutrophils Relative %: 82 %
Platelets: 204 10*3/uL (ref 150–400)
RBC: 2.96 MIL/uL — ABNORMAL LOW (ref 4.22–5.81)
RDW: 17.7 % — ABNORMAL HIGH (ref 11.5–15.5)
WBC: 8.8 10*3/uL (ref 4.0–10.5)
nRBC: 0 % (ref 0.0–0.2)

## 2022-06-28 LAB — RENAL FUNCTION PANEL
Albumin: 2.2 g/dL — ABNORMAL LOW (ref 3.5–5.0)
Anion gap: 14 (ref 5–15)
BUN: 77 mg/dL — ABNORMAL HIGH (ref 6–20)
CO2: 25 mmol/L (ref 22–32)
Calcium: 9.3 mg/dL (ref 8.9–10.3)
Chloride: 93 mmol/L — ABNORMAL LOW (ref 98–111)
Creatinine, Ser: 8.46 mg/dL — ABNORMAL HIGH (ref 0.61–1.24)
GFR, Estimated: 7 mL/min — ABNORMAL LOW (ref >60)
Glucose, Bld: 137 mg/dL — ABNORMAL HIGH (ref 70–99)
Phosphorus: 5.3 mg/dL — ABNORMAL HIGH (ref 2.5–4.6)
Potassium: 4.9 mmol/L (ref 3.5–5.1)
Sodium: 132 mmol/L — ABNORMAL LOW (ref 135–145)

## 2022-06-28 LAB — GLUCOSE, CAPILLARY
Glucose-Capillary: 129 mg/dL — ABNORMAL HIGH (ref 70–99)
Glucose-Capillary: 79 mg/dL (ref 70–99)
Glucose-Capillary: 195 mg/dL — ABNORMAL HIGH (ref 70–99)

## 2022-06-28 MED ORDER — HEPARIN SODIUM (PORCINE) 1000 UNIT/ML IJ SOLN
INTRAMUSCULAR | Status: AC
  Administered 2022-06-28: 2500 [IU]
  Filled 2022-06-28: qty 3

## 2022-06-28 MED ORDER — GABAPENTIN 100 MG PO CAPS
200.0000 mg | ORAL_CAPSULE | Freq: Every day | ORAL | Status: DC
  Administered 2022-06-28 – 2022-07-06 (×9): 200 mg via ORAL
  Filled 2022-06-28 (×10): qty 2

## 2022-06-28 NOTE — Progress Notes (Signed)
Patient ID: Scott Castillo, male   DOB: 11/11/1960, 62 y.o.   MRN: [id number]  Pt reports his wife tool the FMLA forms so could turn in to employer. He was discussing with family this weekend concern about not having a ramp where he is going and discussing his options. Family feels he may need to go to a SNF for a short time until all can be accomplished-ie ramp. He will need a ramp to be able to go back and forth to HD. He wants this worker to pursue looking into [facility] for a short time. Will start paperwork, asked if [facility] will not accept him and he will need to talk with family regarding other options.

## 2022-06-28 NOTE — Progress Notes (Signed)
Physical Therapy Session Note  Patient Details  Name: Scott Castillo MRN: [id number] Date of Birth: [birth year]  Today's Date: 06/28/2022 PT Individual Time: [id number] PT Individual Time Calculation (min): 70 min   Short Term Goals: Week 2:  PT Short Term Goal 1 (Week 2): Patient will perform bed<>chair transfer at [facility] level with slide board. PT Short Term Goal 2 (Week 2): Patient will manage WC brakes, leg rests, armrests with supervision/cues only for safe propulsion and WC transfers. PT Short Term Goal 3 (Week 2): Pt will self propel WC 100' with bil UE and no seated rest break with supervision only.  Skilled Therapeutic Interventions/Progress Updates:     Pt received sitting in WC and agrees to therapy. Reports 3/10 pain in residual limb. PT provides mobility and rest breaks to manage pain. Pt self propels WC to gym for endurance and mobility training. Pt requires x2-3 brief seated rest breaks, completing total of 160' propulsion with cues for navigation. Pt performs x20 LAQs with R lower extremity in preparation for standing in parallel bars. As pt is aout to stand, he reports that he needs to have a bowel movement. Pt propels WC back to room with x2 rest breaks. Pt performs stand step transfer to toilet with RW, initially with modA for sit to stand, then totalA due to pt having LOB backward during transfer, with PT guiding pt's hips back to toilet for safety. Pt has small abrasion on L hand from LOB. RN notified. Pt has continent bowel movement on toilet.   Pt attempts to stand multiple times from toilet and it unable to fully stand from low toilet. PT decides to use stedy for ease and safety. Pt requires x2-3 attempts to successfully stand to [facility], with cues for hand placement, body mechanics, and initiation. Stedy transfer to [facility] with cues for positioning. Pt self propels WC back to gym.  Pt stands in parallel bars with minA/modA and PT blocking R knee. In standing, PT cues for engagement  of hip extensors and abductors (on R). Pt able to stand 1-2 minutes prior to requiring rest break. Pt takes extended seated rest break and completes x1 additional bout of standing, though requires heavy modA for sit to stands.   Pt self propels WC back to room. Left seated with all needs within reach.  Therapy Documentation Precautions:  Precautions Precautions: Fall Precaution Comments: L BKA Other Brace: L limb protector Restrictions Weight Bearing Restrictions: No LLE Weight Bearing: Partial weight bearing    Therapy/Group: Individual Therapy  Breck Coons, PT, DPT 06/28/2022, 4:54 PM

## 2022-06-28 NOTE — Progress Notes (Signed)
Occupational Therapy Session Note  Patient Details  Name: Scott Castillo MRN: [id number] Date of Birth: [birth year]  Today's Date: 06/28/2022 OT Individual Time: [id number] & 1050-1200 OT Individual Time Calculation (min): 40 min & 70 min   Short Term Goals: Week 2:  OT Short Term Goal 1 (Week 2): Pt will stand from w/c with min A OT Short Term Goal 2 (Week 2): Pt will complete stand pivot transfer with mod A OT Short Term Goal 3 (Week 2): Pt will complete toileting tasks with min A overall  Skilled Therapeutic Interventions/Progress Updates:  Session 1 Skilled OT intervention completed with focus on functional transfers, DC planning, self-care needs. Pt received upright in bed, agreeable to session. No pain reported.  Donned limb guard with supervision. Transitioned to EOB with mod I. Total A needed for donning sock, with pt using figure 4 position for putting shoe on with CGA for sitting balance to due posterior bias however min A for getting heel up. Discussed shoe horn and sock aid AE that can enable efficiency with LB dressing.  Supervision lateral scoot > w/c to L side. Attempted lateral lean to donn pants over hips that were halfway down legs, only successful with R side with heavy pushing of RLE with w/c tipping back, therefore utilized tricep push up for assist with scooping under bottom. Self-propelled to sink for oral care with mod I.  Completed about 50 ft x2 of w/c mobility including one turn down hallway with supervision. Rest breaks required for fatigue. Reviewed DC plans with installation of ramp, potential DC to another facility to allow time for it to be installed. Discussed bringing bathroom out to him with BSC/sponge bathe if unable to access bathroom and importance of drop arm BSC if doing squat pivots at home since all he currently has is an old standard BSC.  Pt remained seated in w/c, with belt alarm on/activated, and with all needs in reach at end of  session.  Session 2 Skilled OT intervention completed with focus on functional transfers, BUE endurance. Pt received seated in w/c, agreeable to session. Phantom pain reported in L BKA; pre-medicated. OT offered rest breaks and repositioning throughout for pain reduction.  Pt politely declined a shower stating "I really need to work on my arms" with request to do exercises/endurance and politely declined standing due to fatigue. Transported dependently in w/c > gym for energy conservation.  Able to remove leg rests with supervision/cues for locating correct pieces. Positioned w/c with supervision, cues for locking w/c prior to lifting arm rest. CGA lateral scoot to EOM due to w/c shifting despite brakes being locked.   Seated EOM with L BKA resting on standard chair for comfort. Completed the following:  (With red theraband) x10 reps Horizontal abduction Self-anchored shoulder flexion each arm Self-anchored bicep flexion each arm Self-anchored tricep extension each arm Therapist anchored scapular retraction each arm Shoulder external rotation Shoulder extension Shoulder diagonal pulls -Cues needed for form and technique  Completed the following core exercises to promote strength needed for standing balance: (With 5.5 pound med ball) -modified seated crunches x15 -modified russian twists 3x5  MD present for rounds with discussion with pt about phantom pain. Supervision lateral scoot to w/c with therapist stabilizing w/c. Self-propelled in w/c about 100 ft back to room. Pt remained seated in w/c, with chair alarm on/activated, and with all needs in reach at end of session.   Therapy Documentation Precautions:  Precautions Precautions: Fall Precaution Comments: L BKA Other Brace: L  limb protector Restrictions Weight Bearing Restrictions: No LLE Weight Bearing: Partial weight bearing    Therapy/Group: Individual Therapy  Blase Mess, MS, OTR/L  06/28/2022, 12:06 PM

## 2022-06-28 NOTE — Progress Notes (Signed)
PROGRESS NOTE   Subjective/Complaints: Seen in PT, working on abd oblique strength Phantom pain "increased since starting gabapentin yesterday Neck pain doing ok  ROS- negative CP, SOB, N/V/D  Objective:   No results found.    Recent Labs    06/25/22 1209 06/28/22 0610  WBC 11.5* 8.8  HGB 8.5* 8.0*  HCT 26.9* 26.9*  PLT 225 204    Recent Labs    06/25/22 1209 06/28/22 0610  NA 130* 132*  K 5.1 4.9  CL 92* 93*  CO2 25 25  GLUCOSE 98 137*  BUN 73* 77*  CREATININE 7.81* 8.46*  CALCIUM 9.2 9.3     Intake/Output Summary (Last 24 hours) at 06/28/2022 1156 Last data filed at 06/28/2022 0855 Gross per 24 hour  Intake 540 ml  Output --  Net 540 ml      Pressure Injury 06/27/22 Heel Right Deep Tissue Pressure Injury - Purple or maroon localized area of discolored intact skin or blood-filled blister due to damage of underlying soft tissue from pressure and/or shear. (Active)  06/27/22 2125  Location: Heel  Location Orientation: Right  Staging: Deep Tissue Pressure Injury - Purple or maroon localized area of discolored intact skin or blood-filled blister due to damage of underlying soft tissue from pressure and/or shear.  Wound Description (Comments):   Present on Admission: No    Physical Exam: Vital Signs Blood pressure 126/89, pulse 72, temperature 97.8 F (36.6 C), temperature source Oral, resp. rate 16, height 6' (1.829 m), weight 106.6 kg, SpO2 93 %.   General: No acute distress Mood and affect are appropriate Heart: Regular rate and rhythm no rubs murmurs or extra sounds Lungs: Clear to auscultation, breathing unlabored, no rales or wheezes Abdomen: Positive bowel sounds, soft nontender to palpation, nondistended Extremities: No clubbing, cyanosis, or edema Skin: No evidence of breakdown, no evidence of rash  Neuro:    Alert and awake, CN 2-12 grossly intact, follows commands    Comments:  Decreased to light touch from R mid shin downwards;  Musculoskeletal:  UE strength 5/5 in Biceps, triceps, WE, grip and FA B/L. Strength symmetrical and unchanged in BUE. Subjective sensory loss LUE? RLE- 5/5 in HF, KE, KF- but moderate foot drop- DF 1/5 and PF 1/5 LLE- NT limb guard on on theerapy      Assessment/Plan: 1. Functional deficits which require 3+ hours per day of interdisciplinary therapy in a comprehensive inpatient rehab setting. Physiatrist is providing close team supervision and 24 hour management of active medical problems listed below. Physiatrist and rehab team continue to assess barriers to discharge/monitor patient progress toward functional and medical goals  Care Tool:  Bathing    Body parts bathed by patient: Right arm, Left arm, Abdomen, Chest, Front perineal area, Right upper leg, Left upper leg, Face, Buttocks, Right lower leg   Body parts bathed by helper: Buttocks, Right lower leg Body parts n/a: Left lower leg   Bathing assist Assist Level: Contact Guard/Touching assist (shower level seated)     Upper Body Dressing/Undressing Upper body dressing   What is the patient wearing?: Pull over shirt    Upper body assist Assist Level: Set up assist  Lower Body Dressing/Undressing Lower body dressing      What is the patient wearing?: Pants, Incontinence brief     Lower body assist Assist for lower body dressing: Moderate Assistance - Patient 50 - 74%     Toileting Toileting    Toileting assist Assist for toileting: Moderate Assistance - Patient 50 - 74%     Transfers Chair/bed transfer  Transfers assist     Chair/bed transfer assist level: Supervision/Verbal cueing (squat<>pivot/lateral scoot)     Locomotion Ambulation   Ambulation assist   Ambulation activity did not occur: Safety/medical concerns (Unable to ambulated at this time secondary to new BKA with global deconditioning)          Walk 10 feet  activity   Assist  Walk 10 feet activity did not occur: Safety/medical concerns        Walk 50 feet activity   Assist Walk 50 feet with 2 turns activity did not occur: Safety/medical concerns         Walk 150 feet activity   Assist Walk 150 feet activity did not occur: Safety/medical concerns         Walk 10 feet on uneven surface  activity   Assist Walk 10 feet on uneven surfaces activity did not occur: Safety/medical concerns         Wheelchair     Assist Is the patient using a wheelchair?: Yes Type of Wheelchair: Manual    Wheelchair assist level: Supervision/Verbal cueing Max wheelchair distance: 150    Wheelchair 50 feet with 2 turns activity    Assist        Assist Level: Supervision/Verbal cueing   Wheelchair 150 feet activity     Assist      Assist Level: Supervision/Verbal cueing   Blood pressure 126/89, pulse 72, temperature 97.8 F (36.6 C), temperature source Oral, resp. rate 16, height 6' (1.829 m), weight 106.6 kg, SpO2 93 %.  Medical Problem List and Plan: 1. 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  Antithrombotics: -DVT/anticoagulation:  Pharmaceutical: Heparin             -antiplatelet therapy: NA 3. Pain Management: Oxycodone prn for pain. No phantom pain so far  -muscle rub for R thigh started- pain improved  3/10 -will start low dose gabapentin for LUE pain (chronic). Pt states phantom pain increasing Will increase gabapentin to '200mg'$  Qhs   -see no record of recent CT/MRI of neck 4. Mood/Behavior/Sleep: LCSW to follow for evaluation and support.              -antipsychotic agents: N/A  -3/8 Melatonin '3mg'$  HS started 5. Neuropsych/cognition: This patient is capable of  making decisions on his own behalf. 6. Skin/Wound Care: Routine pressure relief measures.   -continue shrinker to left BK stump 7. Fluids/Electrolytes/Nutrition: Strict I/O. Daily weights. Enforce 1200 cc FR.             --Reports on multiple supplements--d/c nephro.  --Continue Juven bid, Zinc and Vitamin-C to promote wound healing.  -Hyponatremia- improved on labs 3/4 to 131 8. Left calf staph positive cultures: IV antibiotics changed to Augmentin  X 19 days per recs.  (stop 07/03/22) 9. HTN: Monitor BP TID--continue Avapro (reduced to '75mg'$  2/27) and Demadex -STTS.   -3/7 irbesartan was previously decreased to allow for more volume to be reduced by nephrology, continue to monitor, BP mildy elevated but stable overall, hesitant to increase BP medications due to dialysis, if BP increases further may need increase irbesartan, Add hydralazine PRN SBP > 180  -3/9-10 BP with fair control, continue current regimen, parameters placed for PT/OT     06/28/2022    5:02 AM 06/28/2022    5:00 AM 06/27/2022    7:51 PM  Vitals with BMI  Weight  235 lbs   BMI  0000000   Systolic [id number]  [id number]  Diastolic 89  91  Pulse 72  80    10. T2DM: Hgb A1C- 7.4. Was on Glimepiride and Rubelsus PTA.  -- Monitor BS ac/hs and use SSI for tighter control.         -2/28 glargine increase to 8 units yesterday, SSI changed to sensitive. CBGs elevated, Monitor        -continue Insulin glargine w/ 2 units novolog TID AC ---> increase to 3u TID (3/1)  -3/4 Increase glargine to 12 units from 10 units  -3/6 increase novolog to 4 units TID AC  -3/10 cbg's higher in AM, probably would benefit from bid glarcine   -will change to 10 qam and 4u qpm and observe   -continue scheduled novolog  CBG (last 3)  Recent Labs    06/27/22 1628 06/27/22 2057 06/28/22 0559  GLUCAP 286* 109* 129*   Controlled 3/11  11. ESRD: Volume being managed with HD MWF. Is basically anuric- voided x2 since admission. Via fistula --Will schedule HD at  the end of the day to help with tolerance of therapy.  12.  Leucocytosis due to LLE osteomyelitis- : Monitor for fevers and other signs of infection.              --WBC resolving 16.4-->22.2-->16.7--> 18.5 2/29, monitor for signs of infection  -3/8 WBC down to 11.5, improved 13. Hyponatremia: Being managed with HD.  14. Cough: resolved  15. Right hip pain: Fall 3 weeks PTA. Will order Voltaren gel TID.  16. Loose stools- due to IV ABX per pt- might need a stool bulker/fiber-   -3/1 Fibercon started '625mg'$  BID  -3/10 lg type 6 stool   LOS: 13 days A FACE TO FACE EVALUATION WAS PERFORMED  Charlett Blake 06/28/2022, 11:56 AM

## 2022-06-28 NOTE — Progress Notes (Addendum)
[facility] Progress Note   Subjective:   Patient seen and examined in room. Just completed PT and waiting for lunch.  Denies CP, SOB, orthopnea, abdominal pain and n/v/d.  Admits to mild pain in L stump where staples are.  No other specific complaints.    Objective Vitals:   06/27/22 1306 06/27/22 1951 06/28/22 0500 06/28/22 0502  BP: (!) 135/96 (!) 156/91  126/89  Pulse: 69 80  72  Resp: '17 16  16  '$ Temp: 98 F (36.7 C) 98.5 F (36.9 C)  97.8 F (36.6 C)  TempSrc: Oral Oral  Oral  SpO2: 94% 92%  93%  Weight:   106.6 kg   Height:       Physical Exam General: chronically ill appearing male in NAD Heart:RRR, no mrg Lungs:breath sounds decreased, +rhonchi Abdomen:soft, NTND Extremities:1+ RLE edema, L BKA in cannister  Dialysis Access: LU AVF +b/t   Filed Weights   06/26/22 0410 06/27/22 0822 06/28/22 0500  Weight: 104.2 kg 106.6 kg 106.6 kg    Intake/Output Summary (Last 24 hours) at 06/28/2022 1229 Last data filed at 06/28/2022 0855 Gross per 24 hour  Intake 540 ml  Output --  Net 540 ml    Additional Objective Labs: Basic Metabolic Panel: Recent Labs  Lab 06/23/22 1515 06/25/22 1209 06/28/22 0610  NA 129* 130* 132*  K 5.2* 5.1 4.9  CL 92* 92* 93*  CO2 '23 25 25  '$ GLUCOSE 159* 98 137*  BUN 77* 73* 77*  CREATININE 8.03* 7.81* 8.46*  CALCIUM 9.2 9.2 9.3  PHOS 4.9* 5.2* 5.3*   Liver Function Tests: Recent Labs  Lab 06/23/22 1515 06/25/22 1209 06/28/22 0610  ALBUMIN 2.1* 2.2* 2.2*   CBC: Recent Labs  Lab 06/21/22 1253 06/23/22 1515 06/25/22 1209 06/28/22 0610  WBC 15.8* 13.2* 11.5* 8.8  NEUTROABS  --   --   --  7.3  HGB 9.1* 8.7* 8.5* 8.0*  HCT 29.6* 27.3* 26.9* 26.9*  MCV 89.2 87.8 88.5 90.9  PLT 252 257 225 204   Medications:   (feeding supplement) PROSource Plus  30 mL Oral BID BM   amoxicillin-clavulanate  1 tablet Oral q1800   vitamin C  1,000 mg Oral Daily   atorvastatin  40 mg Oral Daily   calcitRIOL  1.25 mcg Oral  Q M,W,F-HD   carvedilol  25 mg Oral BID WC   Chlorhexidine Gluconate Cloth  6 each Topical Q0600   darbepoetin (ARANESP) injection - DIALYSIS  200 mcg Subcutaneous Q Tue-1800   diclofenac Sodium  2 g Topical TID AC   gabapentin  200 mg Oral QHS   Gerhardt's butt cream   Topical BID   guaiFENesin  600 mg Oral BID   heparin  5,000 Units Subcutaneous Q8H   insulin aspart  0-5 Units Subcutaneous QHS   insulin aspart  0-9 Units Subcutaneous TID with meals   insulin aspart  4 Units Subcutaneous TID WC   insulin glargine-yfgn  10 Units Subcutaneous Daily   And   insulin glargine-yfgn  4 Units Subcutaneous Daily   irbesartan  75 mg Oral Daily   lactobacillus  1 g Oral TID WC   melatonin  3 mg Oral QHS   multivitamin  1 tablet Oral QHS   pantoprazole  40 mg Oral Daily   polycarbophil  625 mg Oral BID   sucroferric oxyhydroxide  1,000 mg Oral TID WC   torsemide  100 mg Oral Once per day on Sun Tue Thu Sat  Dialysis Orders: [facility] MWF 4h 63mn  400/800   108.5kg   2/2 bath  LFA AVF   - No Heparin  - rocaltrol 1.25 mcg po tiw - mircera 225 mcg IV  q 2wks, last 2/12, due 2/26 - last Hb 8.6 on 2/14   Assessment/Plan: Leukocytosis/L foot 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 On Augmentin.  Hyponatremia - worsening in the setting of hypervolemia. managing with HD/UF. Fluid restriction reinforced. Resolved, Na+ 132.  ESRD - on HD MWF. Next HD today.  K improved with renal diet.  HTN/ volume - Getting under edw, continues to have edema, UF as tolerated.  EDW should be lower on d/c post BKA. BP in goal with decreased irbesartan 75 mg PO daily started on 06/16/22. On hydralazine prn for SBP>180.  Anemia esrd - Hgb 8.0. s/p 1 unit prbcs 2/22. On Aranesp 2033m qTues starting 06/22/22. Check iron panel when he completes ABX.  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 Cont po vdra and velphoro as binder.  DM2 - Chronic hyperglycemia, poorly controlled DMT2. PMD adjusting meds.  Nutrition -  hypoalbuminemia- Continue protein supplement. Fluid restriction  LiJen MowPA-C [facility] 06/28/2022,12:29 PM  LOS: 13 days    Seen and examined independently.  Agree with note and exam as documented above by physician extender and as noted here.  Feels ok - seen in gym working with therapy.  Needs to rest often, tires easily.   General adult male in bed in no acute distress HEENT normocephalic atraumatic extraocular movements intact sclera anicteric Neck supple trachea midline Lungs clear to auscultation bilaterally normal work of breathing at rest  Heart S1S2 no rub Abdomen soft nontender nondistended Extremities 1+ edema right leg; left BKA Psych normal mood and affect Access left AVF bruit and thrill   ESRD - on HD per MWF schedule   Left foot abscess - abx per primary team. S/p left BKA  HTN - optimize volume with HD; note that he will have a new EDW s/p amputation. S/p reduction in irbesartan per charting. May need to stop  Anemia CKD - on ESA.   LoClaudia DesanctisMD 06/28/2022  1:40 PM

## 2022-06-28 NOTE — NC FL2 (Signed)
[place] MEDICAID FL2 LEVEL OF CARE FORM     IDENTIFICATION  Patient Name: Scott Castillo Birthdate: 1961/01/13 Sex: male Admission Date (Current Location): 06/15/2022  [facility] and [place] Number:  [employer] and Address:  [facility], [place] [address], [place], [place] [address]      Provider Number: [id number]  Attending Physician Name and Address:  Jennye Boroughs, MD  Relative Name and Phone Number:  Ortencia Kick [phone]    Current Level of Care: Other (Comment) (REHAB) Recommended Level of Care: [place] Prior Approval Number:    Date Approved/Denied:   PASRR Number: [id number]  Discharge Plan: SNF    Current Diagnoses: Patient Active Problem List   Diagnosis Date Noted   Unilateral complete BKA, left, initial encounter ([place]) 06/15/2022   Abscess of left lower leg 06/11/2022   Subacute osteomyelitis of left foot ([place]) 06/10/2022   Pressure injury of skin 06/09/2022   Diabetic infection of left foot ([place]) 06/09/2022   HTN (hypertension) 06/09/2022   Right hip pain 06/09/2022   Expressive aphasia 04/20/2021   Obesity (BMI 30-39.9) 10/08/2020   Hyperkalemia, diminished renal excretion 08/15/2020   ESRD (end stage renal disease) ([place]) 09/04/2019   Type 2 diabetes mellitus with ESRD (end-stage renal disease) ([place]) 07/03/2016   Hypertensive urgency 07/03/2016   Hyperlipidemia 07/03/2016   Anemia of chronic disease 07/03/2016    Orientation RESPIRATION BLADDER Height & Weight     Self, Time, Situation, Place  Normal Continent Weight: 235 lb 0.2 oz (106.6 kg) Height:  6' (182.9 cm)  BEHAVIORAL SYMPTOMS/MOOD NEUROLOGICAL BOWEL NUTRITION STATUS      Continent Diet (RENAL DIET WITH 1200 FLUID RESTRICTION-THIN LIQUIDS)  AMBULATORY STATUS COMMUNICATION OF NEEDS Skin   Extensive Assist Verbally Surgical wounds                       Personal Care Assistance Level of Assistance  Bathing, Dressing Bathing  Assistance: Limited assistance   Dressing Assistance: Limited assistance     Functional Limitations Info             SPECIAL CARE FACTORS FREQUENCY  PT (By licensed PT), OT (By licensed OT)     PT Frequency: 5X DAILY OT Frequency: 5X DAILY            Contractures Contractures Info: Not present    Additional Factors Info  Code Status, Allergies Code Status Info: FULL CODE Allergies Info: PIOGLITAZONE, ADHESIVE TAPE, LATEX, HEPARIN           Current Medications (06/28/2022):  This is the current hospital active medication list Current Facility-Administered Medications  Medication Dose Route Frequency Provider Last Rate Last Admin   (feeding supplement) PROSource Plus liquid 30 mL  30 mL Oral BID BM Love, Pamela S, PA-C   30 mL at 06/28/22 1044   acetaminophen (TYLENOL) tablet 325-650 mg  325-650 mg Oral Q4H PRN Bary Leriche, PA-C   650 mg at 06/27/22 0405   alteplase (CATHFLO ACTIVASE) injection 2 mg  2 mg Intracatheter Once PRN Adelfa Koh, NP       amoxicillin-clavulanate (AUGMENTIN) 500-125 MG per tablet 1 tablet  1 tablet Oral [id number] Jennye Boroughs, MD   1 tablet at 06/27/22 1743   ascorbic acid (VITAMIN C) tablet 1,000 mg  1,000 mg Oral Daily Bary Leriche, PA-C   1,000 mg at 06/28/22 0739   atorvastatin (LIPITOR) tablet 40 mg  40 mg Oral Daily Love,  Ivan Anchors, PA-C   40 mg at 06/28/22 [id number]   bisacodyl (DULCOLAX) suppository 10 mg  10 mg Rectal Daily PRN Love, Pamela S, PA-C       calcitRIOL (ROCALTROL) capsule 1.25 mcg  1.25 mcg Oral Q M,W,F-HD Bary Leriche, PA-C   1.25 mcg at 06/25/22 2035   carvedilol (COREG) tablet 25 mg  25 mg Oral BID WC LoveIvan Anchors, PA-C   25 mg at 06/27/22 1743   Chlorhexidine Gluconate Cloth 2 % PADS 6 each  6 each Topical Q0600 Adelfa Koh, NP   6 each at 06/28/22 [id number]   Darbepoetin Alfa (ARANESP) injection 200 mcg  200 mcg Subcutaneous Q Tue-1800 Pierce, Dwayne A, RPH   200 mcg at 06/22/22 1811   diclofenac Sodium  (VOLTAREN) 1 % topical gel 2 g  2 g Topical TID AC Love, Pamela S, PA-C   2 g at 06/28/22 1227   diphenhydrAMINE (BENADRYL) 12.5 MG/5ML elixir 12.5-25 mg  12.5-25 mg Oral Q6H PRN Love, Pamela S, PA-C       gabapentin (NEURONTIN) capsule 200 mg  200 mg Oral QHS Kirsteins, Luanna Salk, MD       Gerhardt's butt cream   Topical BID Bary Leriche, PA-C   Given at 06/28/22 0740   guaiFENesin (MUCINEX) 12 hr tablet 600 mg  600 mg Oral BID Jennye Boroughs, MD   600 mg at 06/28/22 0739   guaiFENesin-dextromethorphan (ROBITUSSIN DM) 100-10 MG/5ML syrup 15 mL  15 mL Oral Q4H PRN Bary Leriche, PA-C   15 mL at 06/28/22 0511   guaiFENesin-dextromethorphan (ROBITUSSIN DM) 100-10 MG/5ML syrup 5-10 mL  5-10 mL Oral Q6H PRN Love, Pamela S, PA-C       heparin injection 5,000 Units  5,000 Units Subcutaneous Q8H Bary Leriche, PA-C   5,000 Units at 06/28/22 [id number]   hydrALAZINE (APRESOLINE) tablet 10 mg  10 mg Oral Q6H PRN Jennye Boroughs, MD       insulin aspart (novoLOG) injection 0-5 Units  0-5 Units Subcutaneous QHS Love, Pamela S, PA-C   2 Units at 06/20/22 2112   insulin aspart (novoLOG) injection 0-9 Units  0-9 Units Subcutaneous TID with meals Jennye Boroughs, MD   1 Units at 06/28/22 [id number]   insulin aspart (novoLOG) injection 4 Units  4 Units Subcutaneous TID WC Jennye Boroughs, MD   4 Units at 06/28/22 [id number]   insulin glargine-yfgn (SEMGLEE) injection 10 Units  10 Units Subcutaneous Daily Meredith Staggers, MD   10 Units at 06/28/22 [id number]   And   insulin glargine-yfgn [facility]) injection 4 Units  4 Units Subcutaneous Daily Meredith Staggers, MD   4 Units at 06/27/22 2107   irbesartan (AVAPRO) tablet 75 mg  75 mg Oral Daily Bary Leriche, PA-C   75 mg at 06/27/22 0851   lactobacillus (FLORANEX/LACTINEX) granules 1 g  1 g Oral TID WC LoveIvan Anchors, PA-C   1 g at 06/28/22 1225   lidocaine (PF) (XYLOCAINE) 1 % injection 5 mL  5 mL Intradermal PRN Adelfa Koh, NP       lidocaine-prilocaine (EMLA)  cream 1 Application  1 Application Topical PRN Adelfa Koh, NP       loperamide (IMODIUM) capsule 2 mg  2 mg Oral Q8H PRN [address], [place], [place]   2 mg at 06/22/22 [id number]   melatonin tablet 3 mg  3 mg Oral QHS Jennye Boroughs, MD   3 mg at 06/27/22 2108   multivitamin (  RENA-VIT) tablet 1 tablet  1 tablet Oral QHS Bary Leriche, PA-C   1 tablet at 06/27/22 2108   Muscle Rub CREA   Topical PRN Jennye Boroughs, MD       Oral care mouth rinse  15 mL Mouth Rinse PRN Love, Pamela S, PA-C       oxyCODONE (Oxy IR/ROXICODONE) immediate release tablet 10 mg  10 mg Oral Q4H PRN Love, Pamela S, PA-C       oxyCODONE (Oxy IR/ROXICODONE) immediate release tablet 5 mg  5 mg Oral Q4H PRN Love, Pamela S, PA-C       pantoprazole (PROTONIX) EC tablet 40 mg  40 mg Oral Daily Bary Leriche, PA-C   40 mg at 06/28/22 [id number]   pentafluoroprop-tetrafluoroeth (GEBAUERS) aerosol 1 Application  1 Application Topical PRN Adelfa Koh, NP       phenol (CHLORASEPTIC) mouth spray 1 spray  1 spray Mouth/Throat PRN Bary Leriche, PA-C   1 spray at 06/20/22 0108   phenol (CHLORASEPTIC) mouth spray 1 spray  1 spray Mouth/Throat PRN Bary Leriche, PA-C   1 spray at 06/21/22 2113   polycarbophil (FIBERCON) tablet 625 mg  625 mg Oral BID Jennye Boroughs, MD   625 mg at 06/28/22 0739   polyethylene glycol (MIRALAX / GLYCOLAX) packet 17 g  17 g Oral Daily PRN Love, Pamela S, PA-C       prochlorperazine (COMPAZINE) tablet 5-10 mg  5-10 mg Oral Q6H PRN Love, Pamela S, PA-C       Or   prochlorperazine (COMPAZINE) injection 5-10 mg  5-10 mg Intramuscular Q6H PRN Love, Pamela S, PA-C       Or   prochlorperazine (COMPAZINE) suppository 12.5 mg  12.5 mg Rectal Q6H PRN Love, Pamela S, PA-C       sucroferric oxyhydroxide (VELPHORO) chewable tablet 1,000 mg  1,000 mg Oral TID WC Love, Pamela S, PA-C   1,000 mg at 06/28/22 1225   torsemide (DEMADEX) tablet 100 mg  100 mg Oral Once per day on Sun Tue Thu Sat Love, Pamela S,  PA-C   100 mg at 06/27/22 1211   traZODone (DESYREL) tablet 25-50 mg  25-50 mg Oral QHS PRN Bary Leriche, PA-C         Discharge Medications: Please see discharge summary for a list of discharge medications.  Relevant Imaging Results:  Relevant Lab Results:   Additional Information SSN: [id number] GOES TO [place], W, F FOR HD 9:45 SLOT  Sarahlynn Cisnero, Gardiner Rhyme, LCSW

## 2022-06-28 NOTE — Progress Notes (Signed)
Received patient in bed to unit.  Alert and oriented.  Informed consent signed and in chart.   TX duration:3h 76mn  Patient tolerated well.  Transported back to the room  Alert, without acute distress.  Hand-off given to patient's nurse.   Access used: AVF Access issues: none  Total UF removed: 4L Medication(s) given: none Post HD VS: 129/82,89 Post HD weight: 102.8kg   VDonah DriverKidney Dialysis Unit

## 2022-06-29 LAB — GLUCOSE, CAPILLARY
Glucose-Capillary: 162 mg/dL — ABNORMAL HIGH (ref 70–99)
Glucose-Capillary: 63 mg/dL — ABNORMAL LOW (ref 70–99)
Glucose-Capillary: 65 mg/dL — ABNORMAL LOW (ref 70–99)
Glucose-Capillary: 107 mg/dL — ABNORMAL HIGH (ref 70–99)
Glucose-Capillary: 161 mg/dL — ABNORMAL HIGH (ref 70–99)
Glucose-Capillary: 114 mg/dL — ABNORMAL HIGH (ref 70–99)
Glucose-Capillary: 134 mg/dL — ABNORMAL HIGH (ref 70–99)

## 2022-06-29 MED ORDER — GLUCOSE 40 % PO GEL
ORAL | Status: AC
  Filled 2022-06-29: qty 1.21

## 2022-06-29 MED ORDER — INSULIN GLARGINE-YFGN 100 UNIT/ML ~~LOC~~ SOLN
4.0000 [IU] | Freq: Every day | SUBCUTANEOUS | Status: DC
  Administered 2022-06-29 – 2022-07-06 (×8): 4 [IU] via SUBCUTANEOUS
  Filled 2022-06-29 (×11): qty 0.04

## 2022-06-29 MED ORDER — CHLORHEXIDINE GLUCONATE CLOTH 2 % EX PADS
6.0000 | MEDICATED_PAD | Freq: Every day | CUTANEOUS | Status: DC
  Administered 2022-07-02 – 2022-07-03 (×2): 6 via TOPICAL

## 2022-06-29 MED ORDER — GLUCOSE 40 % PO GEL: 1.0000 | ORAL | Status: AC

## 2022-06-29 MED ORDER — INSULIN GLARGINE-YFGN 100 UNIT/ML ~~LOC~~ SOLN
12.0000 [IU] | Freq: Every day | SUBCUTANEOUS | Status: DC
  Administered 2022-06-30 – 2022-07-08 (×9): 12 [IU] via SUBCUTANEOUS
  Filled 2022-06-29 (×10): qty 0.12

## 2022-06-29 NOTE — Progress Notes (Addendum)
Scott Castillo Progress Note   Subjective:   Patient seen and examined in room.  Sitting in wheelchair about to start next PT session.  Denies CP, SOB, abdominal pain and n/v/d.  Tolerated dialysis well yesterday.    Objective Vitals:   06/28/22 1910 06/28/22 1944 06/29/22 0346 06/29/22 0447  BP:  121/75 127/84   Pulse:  77 77   Resp:  18 17   Temp:  97.9 F (36.6 C) 97.8 F (36.6 C)   TempSrc:  Oral    SpO2:  93% 96%   Weight: 102.8 kg   103 kg  Height:       Physical Exam General:alert male in NAD Heart:RRR Lungs:+rhonchi in bases, nml WOB on RA Abdomen:soft, NTND Extremities:trace RLE edema, L BKA Dialysis Access: LU AVF +b/t   Filed Weights   06/28/22 0500 06/28/22 1910 06/29/22 0447  Weight: 106.6 kg 102.8 kg 103 kg    Intake/Output Summary (Last 24 hours) at 06/29/2022 1019 Last data filed at 06/28/2022 2123 Gross per 24 hour  Intake 480 ml  Output 4000 ml  Net -3520 ml    Additional Objective Labs: Basic Metabolic Panel: Recent Labs  Lab 06/23/22 1515 06/25/22 1209 06/28/22 0610  NA 129* 130* 132*  K 5.2* 5.1 4.9  CL 92* 92* 93*  CO2 '23 25 25  '$ GLUCOSE 159* 98 137*  BUN 77* 73* 77*  CREATININE 8.03* 7.81* 8.46*  CALCIUM 9.2 9.2 9.3  PHOS 4.9* 5.2* 5.3*   Liver Function Tests: Recent Labs  Lab 06/23/22 1515 06/25/22 1209 06/28/22 0610  ALBUMIN 2.1* 2.2* 2.2*  CBC: Recent Labs  Lab 06/23/22 1515 06/25/22 1209 06/28/22 0610  WBC 13.2* 11.5* 8.8  NEUTROABS  --   --  7.3  HGB 8.7* 8.5* 8.0*  HCT 27.3* 26.9* 26.9*  MCV 87.8 88.5 90.9  PLT 257 225 204    CBG: Recent Labs  Lab 06/27/22 2057 06/28/22 0559 06/28/22 1202 06/28/22 2156 06/29/22 0624  GLUCAP 109* 129* 79 195* 162*    Medications:   (feeding supplement) PROSource Plus  30 mL Oral BID BM   amoxicillin-clavulanate  1 tablet Oral q1800   vitamin C  1,000 mg Oral Daily   atorvastatin  40 mg Oral Daily   calcitRIOL  1.25 mcg Oral Q M,W,F-HD   carvedilol   25 mg Oral BID WC   Chlorhexidine Gluconate Cloth  6 each Topical Q0600   darbepoetin (ARANESP) injection - DIALYSIS  200 mcg Subcutaneous Q Tue-1800   diclofenac Sodium  2 g Topical TID AC   gabapentin  200 mg Oral QHS   Gerhardt's butt cream   Topical BID   guaiFENesin  600 mg Oral BID   heparin  5,000 Units Subcutaneous Q8H   insulin aspart  0-5 Units Subcutaneous QHS   insulin aspart  0-9 Units Subcutaneous TID with meals   insulin aspart  4 Units Subcutaneous TID WC   [START ON 06/30/2022] insulin glargine-yfgn  12 Units Subcutaneous Daily   And   insulin glargine-yfgn  4 Units Subcutaneous Daily   irbesartan  75 mg Oral Daily   lactobacillus  1 g Oral TID WC   melatonin  3 mg Oral QHS   multivitamin  1 tablet Oral QHS   pantoprazole  40 mg Oral Daily   polycarbophil  625 mg Oral BID   sucroferric oxyhydroxide  1,000 mg Oral TID WC   torsemide  100 mg Oral Once per day on Sun Tue Thu Sat  Dialysis Orders: [facility] MWF 4h 40mn  400/800   108.5kg   2/2 bath  LFA AVF   - No Heparin  - rocaltrol 1.25 mcg po tiw - mircera 225 mcg IV  q 2wks, last 2/12, due 2/26 - last Hb 8.6 on 2/14   Assessment/Plan: Leukocytosis/L foot 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 On Augmentin.  Hyponatremia - worsening in the setting of hypervolemia. managing with HD/UF. Fluid restriction reinforced. Resolved, Na+ 132.  ESRD - on HD MWF. HD tomorrow per regular schedule.  K improved with renal diet.  HTN/ volume - Getting under edw, continues to have edema, UF as tolerated.  EDW should be lower on d/c post BKA. BP in goal with decreased irbesartan 75 mg PO daily started on 06/16/22. On hydralazine prn for SBP>180. BP in goal today. Anemia esrd - Last Hgb 8.0. s/p 1 unit prbcs 2/22. On Aranesp 2025m qTues starting 06/22/22. Check iron panel when he completes ABX.  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 Cont po vdra and velphoro as binder.  DM2 - Chronic hyperglycemia, poorly controlled DMT2. PMD  adjusting meds.  Nutrition - hypoalbuminemia- Continue protein supplement. Fluid restriction   Scott Castillo 06/29/2022,10:19 AM  LOS: 14 days    Seen and examined independently.  Agree with note and exam as documented above by physician extender and as noted here.  General adult male in bed in no acute distress HEENT normocephalic atraumatic extraocular movements intact sclera anicteric Neck supple trachea midline Lungs clear to auscultation bilaterally normal work of breathing at rest on room air Heart S1S2 no rub Abdomen soft nontender nondistended Extremities trace edema right leg and left BKA Psych normal mood and affect Access LUE AVF bruit and thrill   ESRD - on HD per MWF schedule.  Renal panel in AM    Left foot abscess - abx per primary team. S/p left BKA   HTN - optimize volume with HD; note that he will have a new EDW s/p amputation. S/p reduction in irbesartan per charting. May need to stop if BP drops (can continue for now)   Anemia CKD - on ESA.  CBC in AM   LoClaudia Castillo 06/29/2022  12:11 PM

## 2022-06-29 NOTE — Progress Notes (Signed)
Physical Therapy Session Note  Patient Details  Name: Scott Castillo MRN: [id number] Date of Birth: 1960/10/17  Today's Date: 06/29/2022 PT Individual Time: 0920-1015 PT Individual Time Calculation (min): 55 min   Short Term Goals: Week 2:  PT Short Term Goal 1 (Week 2): Patient will perform bed<>chair transfer at [facility] level with slide board. PT Short Term Goal 2 (Week 2): Patient will manage WC brakes, leg rests, armrests with supervision/cues only for safe propulsion and WC transfers. PT Short Term Goal 3 (Week 2): Pt will self propel WC 100' with bil UE and no seated rest break with supervision only.  Skilled Therapeutic Interventions/Progress Updates: Pt presented in w/c with kidney PA present agreeable to therapy. Pt states mild pain in residual limb, did not rate with repositioning and rest breaks provided during session. Pt propelled to main gym with supervision and x 2 seated rest breaks. Unfortunately room was full therefore pt propelled additional distance to ortho gym. With increased time pt was able to release leg rest/amputee pad and remove. Pt then performed lateral scoot (with slight lift) to high/low mat. Pt then participated in Sit to stand x 5 with 4/5 performed from 22in height. Pt was able to maintain standing for ~10-15 seconds but did require increasing assist with to fatigue. On last stand pt participated in x 2 rounds of horseshoes for standing balance with min to moderate reaches requiring CGA overall however noted some support of LLE bracing against mat. Pt. Performed seated leg extension with green theraband 2 x 10 with verbal cues to bring knees to chest and to power R LE into full extension and to control eccentric portion of movement. Pt. then performed seated tricep extension on yoga blocks on high/low mat x11. Pt performed lateral transfer back to [facility] with supervision for safety and wc stabilization from therapist. Therapist transported pt. Back to room. Pt. Left in wc  with needs in reach.     Therapy Documentation Precautions:  Precautions Precautions: Fall Precaution Comments: L BKA Other Brace: L limb protector Restrictions Weight Bearing Restrictions: No LLE Weight Bearing: Partial weight bearing    Therapy/Group: Individual Therapy  Dominic Dortha Kern, PTA Nivan Melendrez 06/29/2022, 12:20 PM

## 2022-06-29 NOTE — Significant Event (Signed)
Hypoglycemic Event  CBG: 63  Treatment: 4 oz juice/soda  Symptoms: None  Follow-up CBG: Time:1224 CBG Result:65  Possible Reasons for Event: Unknown  Comments/MD notified: Lunch tray arrived and will check after lunch.    Creig Hines, Susa Raring

## 2022-06-29 NOTE — Progress Notes (Signed)
Physical Therapy Session Note  Patient Details  Name: Scott Castillo MRN: [id number] Date of Birth: [birth year]  Today's Date: 06/29/2022 PT Individual Time: 1032-1100; 1453-1540 PT Individual Time Calculation (min): 28 min; 47 min   Short Term Goals: Week 2:  PT Short Term Goal 1 (Week 2): Patient will perform bed<>chair transfer at [facility] level with slide board. PT Short Term Goal 2 (Week 2): Patient will manage WC brakes, leg rests, armrests with supervision/cues only for safe propulsion and WC transfers. PT Short Term Goal 3 (Week 2): Pt will self propel WC 100' with bil UE and no seated rest break with supervision only.  Skilled Therapeutic Interventions/Progress Updates:  AM Session:   Patient received in [facility] in room. Session focused on lateral squat pivot transfers WC<>bed with cues needed for slide board set up and for head/hip relationship to facilitate buttock clearance. CGA for slide board transfer. Pt able to maintain seated balance EOB to adjust Lt limb protector with supervision. Repeat sit<>stands with Stedy completed for Rt LE strengthening; EOB elevated slightly. Pt completed 5x sit<>stands with ~30-60 seconds of standing between each rep, seated rest between reps increased over time as pt fatigued. Pt required increased Mod assist with power up with bil UE on crossbar, Min assist for power up with Rt UE push off EOB and Lt UE on crossbar of Stedy. EOS pt able to bring Bil LE's onto bed to return to supine. Alarm on, call bell within reach, and all needs available.   PM Session: Patient received in [facility] ready for final therapy session for the day. Pt self propelled to main gym ~ 150' with bil UE use, 2 seated rest breaks due to fatigue. Pt completed slide board transfer WC>mat table with CGA for transfer but min assist for WC set up as pt has difficulty manipulating leg rests due to dexterity and strength deficits in hands. Once EOM table pt completed lateral lean to place dynadisc under  himself and sit on disc. Seated dyanmic balance completed on dynadisc (gray) for core strengthening. 3x 16 tosses with 3 kg, ball tosses to therapist, to Rt/Lt to encourage oblique activation. First set pt's Rt foot on floor for support, second/third table elevated so no Rt foot support provided to create less stability. Pt completed squat pivot transfer mat>WC with set up assist from therapist and CGA. Pt propelled to // bars for sit<>stands for Rt LE strengthening. Pt performed 2x sit<>stand, Bil UE on bars for pull up and Mod assist to rise, pt maintained standing for ~1-2 minutes, on second stand pt sang the "alphabet song". Seated rest provided after song and pt self propelled back to room, he made it 67' propelling with supervision and then dependently rolled due to fatigue. EOS pt completed squat pivot WC>bed with CGA and returned to supine with supervision.    Therapy Documentation Precautions:  Precautions Precautions: Fall Precaution Comments: L BKA Other Brace: L limb protector Restrictions Weight Bearing Restrictions: No LLE Weight Bearing: Partial weight bearing   Therapy/Group: Individual Therapy   Verner Mould, DPT Acute Rehabilitation Services Office [phone]  06/29/22 3:59 PM

## 2022-06-29 NOTE — Progress Notes (Signed)
PROGRESS NOTE   Subjective/Complaints: Pt up in chair. Left arm sx better. Not having any phantom limb pain either. Tolerating gabapentin  ROS: Patient denies fever, rash, sore throat, blurred vision, dizziness, nausea, vomiting, diarrhea, cough, shortness of breath or chest pain,   headache, or mood change.   Objective:   No results found.    Recent Labs    06/28/22 0610  WBC 8.8  HGB 8.0*  HCT 26.9*  PLT 204   Recent Labs    06/28/22 0610  NA 132*  K 4.9  CL 93*  CO2 25  GLUCOSE 137*  BUN 77*  CREATININE 8.46*  CALCIUM 9.3    Intake/Output Summary (Last 24 hours) at 06/29/2022 1001 Last data filed at 06/28/2022 2123 Gross per 24 hour  Intake 480 ml  Output 4000 ml  Net -3520 ml     Pressure Injury 06/27/22 Heel Right Deep Tissue Pressure Injury - Purple or maroon localized area of discolored intact skin or blood-filled blister due to damage of underlying soft tissue from pressure and/or shear. (Active)  06/27/22 2125  Location: Heel  Location Orientation: Right  Staging: Deep Tissue Pressure Injury - Purple or maroon localized area of discolored intact skin or blood-filled blister due to damage of underlying soft tissue from pressure and/or shear.  Wound Description (Comments):   Present on Admission: No    Physical Exam: Vital Signs Blood pressure 127/84, pulse 77, temperature 97.8 F (36.6 C), resp. rate 17, height 6' (1.829 m), weight 103 kg, SpO2 96 %.   Constitutional: No distress . Vital signs reviewed. HEENT: NCAT, EOMI, oral membranes moist Neck: supple Cardiovascular: RRR without murmur. No JVD    Respiratory/Chest: CTA Bilaterally without wheezes or rales. Normal effort    GI/Abdomen: BS +, non-tender, non-distended Ext: no clubbing, cyanosis, or edema Psych: pleasant and cooperative  Skin: No evidence of breakdown, no evidence of rash  Neuro:    Alert and awake, CN 2-12 grossly  intact, follows commands    Comments: Decreased to light touch from R mid shin downwards;  Musculoskeletal:  UE strength 5/5 in Biceps, triceps, WE, grip and FA B/L. Strength symmetrical and unchanged in BUE. Subjective sensory loss LUE? RLE- 5/5 in HF, KE, KF- but moderate foot drop- DF 1/5 and PF 1/5 LLE- NT limb guard on and up in w/c.      Assessment/Plan: 1. Functional deficits which require 3+ hours per day of interdisciplinary therapy in a comprehensive inpatient rehab setting. Physiatrist is providing close team supervision and 24 hour management of active medical problems listed below. Physiatrist and rehab team continue to assess barriers to discharge/monitor patient progress toward functional and medical goals  Care Tool:  Bathing    Body parts bathed by patient: Right arm, Left arm, Abdomen, Chest, Front perineal area, Right upper leg, Left upper leg, Face, Buttocks, Right lower leg   Body parts bathed by helper: Buttocks, Right lower leg Body parts n/a: Left lower leg   Bathing assist Assist Level: Contact Guard/Touching assist (shower level seated)     Upper Body Dressing/Undressing Upper body dressing   What is the patient wearing?: Pull over shirt    Upper  body assist Assist Level: Set up assist    Lower Body Dressing/Undressing Lower body dressing      What is the patient wearing?: Pants, Incontinence brief     Lower body assist Assist for lower body dressing: Moderate Assistance - Patient 50 - 74%     Toileting Toileting    Toileting assist Assist for toileting: Moderate Assistance - Patient 50 - 74%     Transfers Chair/bed transfer  Transfers assist     Chair/bed transfer assist level: Supervision/Verbal cueing (squat<>pivot/lateral scoot)     Locomotion Ambulation   Ambulation assist   Ambulation activity did not occur: Safety/medical concerns (Unable to ambulated at this time secondary to new BKA with global deconditioning)           Walk 10 feet activity   Assist  Walk 10 feet activity did not occur: Safety/medical concerns        Walk 50 feet activity   Assist Walk 50 feet with 2 turns activity did not occur: Safety/medical concerns         Walk 150 feet activity   Assist Walk 150 feet activity did not occur: Safety/medical concerns         Walk 10 feet on uneven surface  activity   Assist Walk 10 feet on uneven surfaces activity did not occur: Safety/medical concerns         Wheelchair     Assist Is the patient using a wheelchair?: Yes Type of Wheelchair: Manual    Wheelchair assist level: Supervision/Verbal cueing Max wheelchair distance: 150    Wheelchair 50 feet with 2 turns activity    Assist        Assist Level: Supervision/Verbal cueing   Wheelchair 150 feet activity     Assist      Assist Level: Supervision/Verbal cueing   Blood pressure 127/84, pulse 77, temperature 97.8 F (36.6 C), resp. rate 17, height 6' (1.829 m), weight 103 kg, SpO2 96 %.  Medical Problem List and Plan: 1. 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  -Continue CIR therapies including PT, OT    -wound vac was discontinued 3/4      2.  Antithrombotics: -DVT/anticoagulation:  Pharmaceutical: Heparin             -antiplatelet therapy: NA 3. Pain Management: Oxycodone prn for pain. No phantom pain so far  -muscle rub for R thigh started- pain improved  3/12 gabapentin '200mg'$  qhs helpful for LUE, no SE   -see no record of recent CT/MRI of neck 4. Mood/Behavior/Sleep: LCSW to follow for evaluation and support.              -antipsychotic agents: N/A  -3/8 Melatonin '3mg'$  HS started 5. Neuropsych/cognition: This patient is capable of making decisions on his own behalf. 6. Skin/Wound Care: Routine pressure relief measures.    -continue shrinker to left BK stump 7. Fluids/Electrolytes/Nutrition: Strict I/O. Daily weights. Enforce 1200 cc FR.             --Reports on multiple supplements--d/c nephro.  --Continue Juven bid, Zinc and Vitamin-C to promote wound healing.  -Hyponatremia- improved on labs 3/4 to 131 8. Left calf staph positive cultures: IV antibiotics changed to Augmentin X  19 days per recs.  (stop 07/03/22) 9. HTN: Monitor BP TID--continue Avapro (reduced to '75mg'$  2/27) and Demadex -STTS.   -3/7 irbesartan was previously decreased to allow for more volume to be reduced by nephrology, continue to monitor, BP mildy elevated but stable overall, hesitant to increase BP medications due to dialysis, if BP increases further may need increase irbesartan, Add hydralazine PRN SBP > 180  -3/12 BP with fair control, continue current regimen, parameters placed for PT/OT     06/29/2022    4:47 AM 06/29/2022    3:46 AM 06/28/2022    7:44 PM  Vitals with BMI  Weight 227 lbs 1 oz    BMI [id number]    Systolic  [id number]  Diastolic  84 75  Pulse  77 77    10. T2DM: Hgb A1C- 7.4. Was on Glimepiride and Rubelsus PTA.  -- Monitor BS ac/hs and use SSI for tighter control.         -2/28 glargine increase to 8 units yesterday, SSI changed to sensitive. CBGs elevated, Monitor        -continue Insulin glargine w/ 2 units novolog TID AC ---> increase to 3u TID (3/1)  -3/4 Increase glargine to 12 units from 10 units  -3/6 increase novolog to 4 units TID AC  -3/10 cbg's higher in AM, probably would benefit from bid glarcine   -will change to 10 qam and 4u qpm and observe   -continue scheduled novolog  CBG (last 3)  Recent Labs    06/28/22 1202 06/28/22 2156 06/29/22 0624  GLUCAP 79 195* 162*   3/12 cbgs still higher in PM--will increase am semglee to 12u  11. ESRD: Volume being managed with HD MWF. Is basically anuric- voided x2 since admission. Via fistula --Will schedule HD at the end of the day to help with tolerance of  therapy.  12.  Leucocytosis due to LLE osteomyelitis- : Monitor for fevers and other signs of infection.              --   -3/12 WBC down to 8.8, improved 13. Hyponatremia: Being managed with HD.  14. Cough: resolved  15. Right hip pain: Fall 3 weeks PTA. Will order Voltaren gel TID.  16. Loose stools- due to IV ABX per pt- might need a stool bulker/fiber-   -3/1 Fibercon started '625mg'$  BID  -3/10 lg type 6 stool   LOS: 14 days A FACE TO FACE EVALUATION WAS PERFORMED  Meredith Staggers 06/29/2022, 10:01 AM

## 2022-06-29 NOTE — Progress Notes (Signed)
Patient ID: Scott Castillo, male   DOB: 1961/01/24, 63 y.o.   MRN: [id number]  Have sent out FL2 to see if will receive bed offers. Will let pt know and work on the plan from here.

## 2022-06-29 NOTE — Progress Notes (Signed)
Patient back from dialysis. Vital signs taken. Dietary contacted for patient's tray. No complaint or distress at this time. Patient laying in bed.

## 2022-06-29 NOTE — Progress Notes (Signed)
Occupational Therapy Session Note  Patient Details  Name: Scott Castillo MRN: [id number] Date of Birth: 30-Jan-1961  Today's Date: 06/29/2022 OT Individual Time: [id number] OT Individual Time Calculation (min): 65 min  and Today's Date: 06/29/2022 OT Missed Time: 10 Minutes Missed Time Reason: Unavailable (comment) (OT unavailable)   Short Term Goals: Week 2:  OT Short Term Goal 1 (Week 2): Pt will stand from w/c with min A OT Short Term Goal 2 (Week 2): Pt will complete stand pivot transfer with mod A OT Short Term Goal 3 (Week 2): Pt will complete toileting tasks with min A overall  Skilled Therapeutic Interventions/Progress Updates:    Pt received supine with 3/10 pain in his residual limb, agreeable to OT session. Pt completed bed mobility to the EOB with mod I using bed features. Lateral scoot/squat pivot with CGA, requiring assist w/c support. LLE occluded for shower. Transfer to TTB with CGA. He completed bathing seated with lateral leans for peri areas with distant supervision. He transferred back to the w/c with CGA. Oral care and grooming tasks seated at the sink with set up assist. He completed LB dressing with min A to pull up in standing. He required min A to stand at the sink with BUE support. One LOB L that pt was able to correct with mod A. He propelled the w/c to the therapy gym to work on BUE endurance and w/c mobility- 1 rest break needed. He completed 2x sit <> stand with mod A with the RW, requiring facilitation to bring hips forward and to elevate trunk. Static standing for ~30 seconds before needing to rest. He required min cueing for LLE positioning. Carryover to ADL transfers. He returned to his room and was left sitting up with all needs met.    10 min missed d/t OT being unavailable, dealing with emergency on the unit.    Therapy Documentation Precautions:  Precautions Precautions: Fall Precaution Comments: L BKA Other Brace: L limb protector Restrictions Weight  Bearing Restrictions: No LLE Weight Bearing: Partial weight bearing  Therapy/Group: Individual Therapy  Curtis Sites 06/29/2022, 6:11 AM

## 2022-06-29 NOTE — Significant Event (Signed)
Hypoglycemic Event  CBG: 65  Treatment: 4 oz juice/soda  Symptoms: None  Follow-up CBG: Time:1328 CBG Result:107  Possible Reasons for Event: Unknown  Comments/MD notified: Patient ate lunch.    Creig Hines, Susa Raring

## 2022-06-30 LAB — CBC
HCT: 26.8 % — ABNORMAL LOW (ref 39.0–52.0)
Hemoglobin: 8.4 g/dL — ABNORMAL LOW (ref 13.0–17.0)
MCH: 27.9 pg (ref 26.0–34.0)
MCHC: 31.3 g/dL (ref 30.0–36.0)
MCV: 89 fL (ref 80.0–100.0)
Platelets: 199 10*3/uL (ref 150–400)
RBC: 3.01 MIL/uL — ABNORMAL LOW (ref 4.22–5.81)
RDW: 18 % — ABNORMAL HIGH (ref 11.5–15.5)
WBC: 7.3 10*3/uL (ref 4.0–10.5)
nRBC: 0 % (ref 0.0–0.2)

## 2022-06-30 LAB — GLUCOSE, CAPILLARY
Glucose-Capillary: 148 mg/dL — ABNORMAL HIGH (ref 70–99)
Glucose-Capillary: 140 mg/dL — ABNORMAL HIGH (ref 70–99)
Glucose-Capillary: 187 mg/dL — ABNORMAL HIGH (ref 70–99)
Glucose-Capillary: 167 mg/dL — ABNORMAL HIGH (ref 70–99)
Glucose-Capillary: 150 mg/dL — ABNORMAL HIGH (ref 70–99)

## 2022-06-30 LAB — RENAL FUNCTION PANEL
Albumin: 2.3 g/dL — ABNORMAL LOW (ref 3.5–5.0)
Anion gap: 15 (ref 5–15)
BUN: 79 mg/dL — ABNORMAL HIGH (ref 6–20)
CO2: 24 mmol/L (ref 22–32)
Calcium: 9.4 mg/dL (ref 8.9–10.3)
Chloride: 93 mmol/L — ABNORMAL LOW (ref 98–111)
Creatinine, Ser: 8.38 mg/dL — ABNORMAL HIGH (ref 0.61–1.24)
GFR, Estimated: 7 mL/min — ABNORMAL LOW (ref >60)
Glucose, Bld: 149 mg/dL — ABNORMAL HIGH (ref 70–99)
Phosphorus: 5.2 mg/dL — ABNORMAL HIGH (ref 2.5–4.6)
Potassium: 4.9 mmol/L (ref 3.5–5.1)
Sodium: 132 mmol/L — ABNORMAL LOW (ref 135–145)

## 2022-06-30 NOTE — Progress Notes (Signed)
Physical Therapy Session Note  Patient Details  Name: Scott Castillo MRN: [id number] Date of Birth: May 04, 1960  Today's Date: 06/30/2022 PT Individual Time: [id number] and 1100-1155 PT Individual Time Calculation (min): 70 min and 55 min  Short Term Goals: Week 2:  PT Short Term Goal 1 (Week 2): Patient will perform bed<>chair transfer at [facility] level with slide board. PT Short Term Goal 2 (Week 2): Patient will manage WC brakes, leg rests, armrests with supervision/cues only for safe propulsion and WC transfers. PT Short Term Goal 3 (Week 2): Pt will self propel WC 100' with bil UE and no seated rest break with supervision only.  Skilled Therapeutic Interventions/Progress Updates: Pt presented in w/c agreeable to therapy. Pt c/o mild pain at incision site. Pt requesting to use bathroom as has been waiting a while. Pt agreeable to try DABSC over toilet. Pt requiring minA to properly align wc to toilet. Performed squat pivot transfer with CGA to toilet and pt was able to perform lateral leans to lower brief/pants with PTA providing minA to assure clearance. Pt with continent BM at toilet and was able to perform prei-care with supervision performing lateral leans. Pt indicating moderate fatigue at this point and requesting to perform lateral scoot transfer to w/c vs standing to complete LB clothing management. Pt was able to complete lateral scoot/nearly squat pivot transfer to w/c with CGA and PTA stabilizing w/c. Pt then able to propel w/c to sink and after extended rest pt able to perform Sit to stand at counter with CGA to allow PTA to pull brief/pants over hips. Pt then completed hand and oral hygiene at sink mod I. RN present to provide am meds. Pt then propelled to main gym with supervision and x 3 seated rest breaks. In parallel bars pt made x 3 attempts to stand from w/c however due to lower height pt was unable to complete stand. Pt therefore propelled from parallel bars to high/low mat and  performed squat pivot transfer to mat. The mat was then elevated and pt was able to complete Sit to stand with minA and was able to perform hip flexion x 8 then before requiring a seated rest. After seated rest pt then performed stand with same assist level again and was able to complete x 10 reps with CGA for standing balance. Pt with noted fatigue and after rest was able to complete squat pivot transfer to w/c with supervision. Pt propelled w/c back to room with x 3 brief rest breaks for general conditioning. In room pt required assist for parts management but performed squat pivot transfer to bed with supervision. Pt transferred to supine and left in bed at end of session with bed alarm on, call bell within reach and needs met.    Tx2: Pt presented in w/c agreeable to therapy. Pt c/o mild R thigh pain, rest breaks provided during session. Pt propelled to MW nsg station for general endurance then transported remaining distance to day room for time management and energy conservation. Participated in Cybex Kinetron 40cm/sec 2 bouts x 2 min RLE only for conditioning and strengthening. Pt then moved over to high/low mat and required assist for parts management but was able to perform squat pivot transfer to mat with supervision and noted increased hip clearance. Participated in standing balance activity performing ball taps for standing tolerance however required multiple attempts from elevated surface and overall modA to achieve standing. Pt was able to complete x 6 taps then x 9 taps without seated  rest breaks. On final tap pt with episode of R knee buckling requiring heavy modA to assist to mat. Pt indicated that fatigue and increased pain in RLE caused buckling. With general palpation noted TTP along ITB, pt was able to transfer to sidelying on mat and ITB stretch was performed with pt indicating stretch did not completely alleviate pain but did provide improvement. PTA discussed continuing work on ITB and will  discuss with nsg obtaining muscle rub that was present last week but not in room today. Pt then transferred to sitting and performed squat pivot transfer to w/c with supervision. Pt required assist to don leg rest and amputee pad. Pt transported back to room and although pt required assist for leg rest/amputee pad pt was able to perform all other tasks to set up w/c to bed and performed squat pivot transfer to with supervision. Pt transferred to supine mod I and repostioned to comfort. Pt left in bed at end of session with bed alarm on, call bell within reach and needs met.      Therapy Documentation Precautions:  Precautions Precautions: Fall Precaution Comments: L BKA Other Brace: L limb protector Restrictions Weight Bearing Restrictions: No LLE Weight Bearing: Partial weight bearing  Therapy/Group: Individual Therapy  Kalijah Zeiss 06/30/2022, 4:46 PM

## 2022-06-30 NOTE — Progress Notes (Addendum)
[facility] Progress Note   Subjective:   Patient seen and examined in room.  Just finishing PT.  Did not sleep well overnight.  Otherwise no complaints.  Denies CP, SOB, abdominal pain and n/v/d.    Objective Vitals:   06/29/22 0346 06/29/22 0447 06/29/22 1440 06/29/22 1843  BP: 127/84  125/78 137/72  Pulse: 77  78 79  Resp: '17  20 19  '$ Temp: 97.8 F (36.6 C)  98.1 F (36.7 C) 98 F (36.7 C)  TempSrc:   Oral Oral  SpO2: 96%  98% 93%  Weight:  103 kg    Height:       Physical Exam General: alert male in NAD Heart:RRR Lungs:+rhonchi in bases, nml WOB on RA Abdomen:soft, NTND Extremities:1+ edema on RLE, trace on LLE, L BKA Dialysis Access: LU AVF +b/t   Filed Weights   06/28/22 0500 06/28/22 1910 06/29/22 0447  Weight: 106.6 kg 102.8 kg 103 kg    Intake/Output Summary (Last 24 hours) at 06/30/2022 1032 Last data filed at 06/30/2022 0900 Gross per 24 hour  Intake 720 ml  Output --  Net 720 ml    Additional Objective Labs: Basic Metabolic Panel: Recent Labs  Lab 06/25/22 1209 06/28/22 0610 06/30/22 0611  NA 130* 132* 132*  K 5.1 4.9 4.9  CL 92* 93* 93*  CO2 '25 25 24  '$ GLUCOSE 98 137* 149*  BUN 73* 77* 79*  CREATININE 7.81* 8.46* 8.38*  CALCIUM 9.2 9.3 9.4  PHOS 5.2* 5.3* 5.2*   Liver Function Tests: Recent Labs  Lab 06/25/22 1209 06/28/22 0610 06/30/22 0611  ALBUMIN 2.2* 2.2* 2.3*    CBC: Recent Labs  Lab 06/23/22 1515 06/25/22 1209 06/28/22 0610 06/30/22 0611  WBC 13.2* 11.5* 8.8 7.3  NEUTROABS  --   --  7.3  --   HGB 8.7* 8.5* 8.0* 8.4*  HCT 27.3* 26.9* 26.9* 26.8*  MCV 87.8 88.5 90.9 89.0  PLT 257 225 204 199   CBG: Recent Labs  Lab 06/29/22 1328 06/29/22 1650 06/29/22 2058 06/29/22 2256 06/30/22 0621  GLUCAP 107* 161* 114* 134* 148*   Medications:   (feeding supplement) PROSource Plus  30 mL Oral BID BM   amoxicillin-clavulanate  1 tablet Oral q1800   vitamin C  1,000 mg Oral Daily   atorvastatin  40 mg  Oral Daily   calcitRIOL  1.25 mcg Oral Q M,W,F-HD   carvedilol  25 mg Oral BID WC   Chlorhexidine Gluconate Cloth  6 each Topical Q0600   Chlorhexidine Gluconate Cloth  6 each Topical Q0600   darbepoetin (ARANESP) injection - DIALYSIS  200 mcg Subcutaneous Q Tue-1800   dextrose  1 Tube Oral STAT   diclofenac Sodium  2 g Topical TID AC   gabapentin  200 mg Oral QHS   Gerhardt's butt cream   Topical BID   guaiFENesin  600 mg Oral BID   heparin  5,000 Units Subcutaneous Q8H   insulin aspart  0-5 Units Subcutaneous QHS   insulin aspart  0-9 Units Subcutaneous TID with meals   insulin aspart  4 Units Subcutaneous TID WC   insulin glargine-yfgn  12 Units Subcutaneous Daily   And   insulin glargine-yfgn  4 Units Subcutaneous Daily   irbesartan  75 mg Oral Daily   lactobacillus  1 g Oral TID WC   melatonin  3 mg Oral QHS   multivitamin  1 tablet Oral QHS   pantoprazole  40 mg Oral Daily   polycarbophil  625 mg Oral BID   sucroferric oxyhydroxide  1,000 mg Oral TID WC   torsemide  100 mg Oral Once per day on Sun Tue Thu Sat    Dialysis Orders: [facility] MWF 4h 27mn  400/800   108.5kg   2/2 bath  LFA AVF   - No Heparin  - rocaltrol 1.25 mcg po tiw - mircera 225 mcg IV  q 2wks, last 2/12, due 2/26 - last Hb 8.6 on 2/14   Assessment/Plan: Leukocytosis/L foot 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 On Augmentin.  Hyponatremia - worsening in the setting of hypervolemia. managing with HD/UF. Fluid restriction reinforced. Resolved, Na+ 132.  ESRD - on HD MWF. HD today per regular schedule.  K improved with renal diet.  HTN/ volume - Getting under edw, continues to have edema, UF as tolerated.  EDW should be lower on d/c post BKA. BP in goal with decreased irbesartan 75 mg PO daily started on 06/16/22. On hydralazine prn for SBP>180. BP in goal today. 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 s/p 1 unit prbcs 2/22. On Aranesp 2091m qTues starting 06/22/22. Check iron panel when he completes ABX.  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 Cont po vdra and velphoro as binder.  DM2 - Chronic hyperglycemia, poorly controlled DMT2. PMD adjusting meds.  Nutrition - hypoalbuminemia- Continue protein supplement. Fluid restriction  Scott MowPA-C [facility] 06/30/2022,10:32 AM  LOS: 15 days    Seen and examined independently.  Agree with note and exam as documented above by physician extender and as noted here.  General adult male in bed in no acute distress HEENT normocephalic atraumatic extraocular movements intact sclera anicteric Neck supple trachea midline Lungs clear to auscultation bilaterally normal work of breathing at rest  Heart S1S2 no rub Abdomen soft nontender nondistended Extremities 1+ edema right leg and left BKA Psych normal mood and affect Access LUE AVF bruit and thrill   ESRD - on HD per MWF schedule   Left foot abscess - abx per primary team. S/p left BKA   HTN - optimize volume with HD; note that he will have a new EDW s/p amputation. S/p reduction in irbesartan per charting   Anemia CKD - on ESA.  200 mcg weekly    LoClaudia DesanctisMD 06/30/2022 12:22 PM

## 2022-06-30 NOTE — Progress Notes (Signed)
   06/30/22 1800  Vitals  Temp 97.9 F (36.6 C)  Temp Source Oral  BP 135/82  MAP (mmHg) 99  BP Location Right Arm  BP Method Automatic  Patient Position (if appropriate) Lying  Pulse Rate 73  Pulse Rate Source Monitor  ECG Heart Rate 87  Resp 17  Level of Consciousness  Level of Consciousness Alert  MEWS COLOR  MEWS Score Color Green  Oxygen Therapy  SpO2 93 %  O2 Device Room Air  Patient Activity (if Appropriate) In bed  Pain Assessment  Pain Scale 0-10  Pain Score 0  PCA/Epidural/Spinal Assessment  Respiratory Pattern Regular;Unlabored  Height and Weight  Weight 101.5 kg  Type of Weight Post-Dialysis  BMI (Calculated) 30.34  ECG Monitoring  Cardiac Rhythm Atrial fibrillation  MEWS Score  MEWS Temp 0  MEWS Systolic 0  MEWS Pulse 0  MEWS RR 0  MEWS LOC 0  MEWS Score 0   Received patient in bed to unit.  Alert and oriented.  Informed consent signed and in chart.   [place] duration:4hrs  Patient tolerated well.  Transported back to the room  Alert, without acute distress.  Hand-off given to patient's nurse.   Access used: Yes without difficulties Access issues: None  Total UF removed: 4000 Medication(s) given: None Post HD VS: See Above Grid Post HD weight: 101.5 kg   [facility]

## 2022-06-30 NOTE — Patient Care Conference (Signed)
Inpatient RehabilitationTeam Conference and Plan of Care Update Date: 06/30/2022   Time: 12:17 PM    Patient Name: Scott Castillo      Medical Record Number: [id number]  Date of Birth: January 13, 1961 Sex: Male         Room/Bed: 4M11C/4M11C-01 Payor Info: Payor: MEDICARE / Plan: MEDICARE PART A AND B / Product Type: *No Product type* /    Admit Date/Time:  06/15/2022  5:02 PM  Primary Diagnosis:  Unilateral complete BKA, left, initial encounter [facility])  Hospital Problems: Principal Problem:   Unilateral complete BKA, left, initial encounter [facility])    Expected Discharge Date: Expected Discharge Date: 07/08/22 (SNF pending)  Team Members Present: Physician leading conference: Dr. Leeroy Cha Social Worker Present: Ovidio Kin, LCSW Nurse Present: Dorien Chihuahua, RN PT Present: Becky Sax, PT;Rosita Dechalus, PTA OT Present: Laverle Hobby, OT PPS Coordinator present : Gunnar Fusi, SLP     Current Status/Progress Goal Weekly Team Focus  Bowel/Bladder   Patient is continent of bowel.   Patient to remain continent during rehab staty.   Assist patient with elimination    Swallow/Nutrition/ Hydration               ADL's   (S) UB ADLs, mod A LB ADLs, (S) slideboard with support on w/c, mod A to stand   CGA overall   ADL transfers, ADLs, limb loss education, dc planning    Mobility   mod I bed mobilty, supervision lateral scoot transfers, minA sit to/from stand from elevated height, modA from lowered surfaces, standing tolerance has improved however continues to be limited to perform hopping, stand pivot with modA. Supervision w/c mobility continues to require assistance for w/c parts management   mod I w/c level, CGA stand pivot, CGA ambulation 22f  Standing tolerance, standing, balance, RLE strengthening, transfers, d/c planning    Communication                Safety/Cognition/ Behavioral Observations               Pain   No complaint of pain.   Patient to  remain free of pain   Pain assessment every shift.    Skin   Patient has non-blanchable redness to buttocks.   Redness to clear before discharge.  continue butt cream as ordered. Turn and reposition.      Discharge Planning:  Pt thinking now with all that needs to be done at home-ramp, etc may need to go to a SNF for a short time and then home due to has OP-HD and needs to get out of home. FL2 out there and awaiting bed offers   Team Discussion: Patient with muscular pain; muscle rub in use. Constipation addressed. Leukocytosis improved. Limited by poor standing tolerance and inability to hop.  Patient on target to meet rehab goals: yes, currently needs mod assist for lower body care. Unable to stand ; completing squat pivot transfers.    *See Care Plan and progress notes for long and short-term goals.   Revisions to Treatment Plan:  Cross stretching ITB   Teaching Needs: Safety, skin care, medications, dietary modifications, transfers, toileting, etc.   Current Barriers to Discharge: Decreased caregiver support, Home enviroment access/layout, Wound care, and Hemodialysis  Possible Resolutions to Barriers: SNF recommended Ramp for entry to home and home repair completion DME: DA - BSC, W/C, RW     Medical Summary Current Status: left BKA healing nicely, minimal phantom limb pain. having more LUE pain d/t ?  foraminal stenosis--gabapentin helpful, ESRD on HD  Barriers to Discharge: Medical stability   Possible Resolutions to [employer] Focus: daily wound care, limb guard, regular HD/lab draws to assess metabolites/volume status   Continued Need for Acute Rehabilitation Level of Care: The patient requires daily medical management by a physician with specialized training in physical medicine and rehabilitation for the following reasons: Direction of a multidisciplinary physical rehabilitation program to maximize functional independence : Yes Medical management of patient  stability for increased activity during participation in an intensive rehabilitation regime.: Yes Analysis of laboratory values and/or radiology reports with any subsequent need for medication adjustment and/or medical intervention. : Yes   I attest that I was present, lead the team conference, and concur with the assessment and plan of the team.   Dorien Chihuahua B 06/30/2022, 1:23 PM

## 2022-06-30 NOTE — Progress Notes (Signed)
Met with pt to give team conference update and progress this week toward his goals. He feels he is making progress but it is slow and his leg can not support him hopping. He will find out from step-dad and wife where they are with the ramp installing. Discussed have sent out FL2 and will call [facility] to see if can offer him a bed, if not what is his other option. He did not know. Discussed have sent out FL2 to all [place] facilities and he may get an offer from one of the others. He is not sure what he wants to do. Will call [facility] and see if can offer a bed to pt. Will follow up with pt once havre a response.

## 2022-06-30 NOTE — Discharge Summary (Signed)
Physician Discharge Summary  Patient ID: Scott Castillo MRN: [id number] DOB/AGE: May 08, 1960 62 y.o.  Admit date: 06/15/2022 Discharge date: 07/08/2022  Discharge Diagnoses:  Principal Problem:   Unilateral complete BKA, left, initial encounter [facility]) Active Problems:   Type 2 diabetes mellitus with ESRD (end-stage renal disease) ([place])   Hyperlipidemia   Anemia of chronic disease   ESRD (end stage renal disease) ([facility])   Obesity (BMI 30-39.9)   Hemodialysis AV fistula aneurysm [facility])   Discharged Condition: stable  Significant Diagnostic Studies: DG Chest 2 View  Result Date: 06/15/2022 CLINICAL DATA:  Cough EXAM: CHEST - 2 VIEW COMPARISON:  Chest x-ray 03/16/2022 FINDINGS: There are patchy right basilar opacities. There is no pleural effusion or pneumothorax. Lung volumes are low. The heart is mildly enlarged. No evidence for pneumothorax or acute fracture. IMPRESSION: 1. Patchy right basilar opacities may represent atelectasis or pneumonia. 2. Low lung volumes. Electronically Signed   By: Ronney Asters M.D.   On: 06/15/2022 20:14   Labs:  Basic Metabolic Panel:    Latest Ref Rng & Units 07/05/2022    9:21 AM 07/02/2022   10:36 AM 06/30/2022    6:11 AM  BMP  Glucose 70 - 99 mg/dL 157  84  149   BUN 6 - 20 mg/dL 87  71  79   Creatinine 0.61 - 1.24 mg/dL 9.45  7.94  8.38   Sodium 135 - 145 mmol/L 130  131  132   Potassium 3.5 - 5.1 mmol/L 4.6  4.7  4.9   Chloride 98 - 111 mmol/L 94  94  93   CO2 22 - 32 mmol/L 24  23  24    Calcium 8.9 - 10.3 mg/dL 9.3  9.5  9.4      CBC: Recent Labs  Lab 07/02/22 1036 07/05/22 0921  WBC 8.6 7.8  HGB 8.7* 8.3*  HCT 29.3* 27.8*  MCV 91.8 91.4  PLT 230 228    CBG: Recent Labs  Lab 07/07/22 1007 07/07/22 1112 07/07/22 1637 07/07/22 1955 07/08/22 0615  GLUCAP 120* 73 192* 147* 143*    Brief HPI:   Scott Castillo is a 62 y.o. male with history of HTN, lumbar radiculopathy, T2DM, retinopathy and neuropathy, ESRD, diabetic foot  ulcer s/p abscess drainage and on antibiotics however he continued to have progressive foot infection with fracture of calcaneus and concerns of osteomyelitis.  He was started on IV antibiotics and MRI foot done showing calcaneal fracture with large abscess and osteomyelitis.  Dr. Sharol Given recommended amputation as foot not felt to be salvageable.  He underwent left BKA on 06/11/2022 and tissue cultures done which were positive for Staph aureus.    IV Zosyn was narrowed to Augmentin's x 3 weeks per input from Dr. Sharol Given. Hospital course significant for poorly controlled blood sugars and meal coverage was added.  He has also had issues with hyponatremia and volume overload.  PT OT has been working with patient who was noted to be limited by weakness, limb loss, as well as balance deficits.  CIR was recommended due to functional decline.   Hospital Course: Scott Castillo was admitted to rehab 06/15/2022 for inpatient therapies to consist of P and OT at least three hours five days a week. Past admission physiatrist, therapy team and rehab RN have worked together to provide customized collaborative inpatient rehab. His blood pressures were monitored on TID basis and he was noted to have some issues with dizziness.  Irbesartan was decreased to 75  mg on admission to help facilitate volume removal and dialysis.  Follow-up CBC showed anemia of chronic disease and Aranesp was added weekly for supplementation.  Reactive leukocytosis has resolved and he has been afebrile.  Voltaren gel was added to help manage right hip pain due to fall few weeks prior to admission.  He has was transition to Augmentin x 19 days to complete his antibiotic regimen.  Diarrhea has resolved off of antibiotics.   Hyponatremia has been managed with dialysis as well as reinforcement of fluid restriction.  His blood sugars has been monitored with ac/hs CBG checks and SSI was use prn for tighter BS control.  Lantus has been titrated upwards with  addition of NovoLog for meal coverage.  Supplements were added to help promote wound healing.  Left BKA incision is healing well and staples were removed on postop day 21 without difficulty.  Melatonin was added to help with sleep-wake disruption.  Gabapentin was added to help manage neuropathic pain and is tolerating this without side effects.  He was noted to have thinning of skin above his fistula  with 2 tandem aneurysms.  Dr. Carlis Abbott was consulted for input and patient underwent LV fistula revision with excision of thin skin and aneurysm plication on [id number].  He has made good progress during his stay however continues to have limitations in standing with functional mobility. Patient and family have elected on SNF for progressive therapy. He was discharged to [facility] on 07/08/22   Rehab course: During patient's stay in rehab weekly team conferences were held to monitor patient's progress, set goals and discuss barriers to discharge. At admission, patient required mod to max assist for mobility and mod assist with basic ADL tasks.  He  has had improvement in activity tolerance, balance, postural control as well as ability to compensate for deficits.  He is able to complete ADL tasks with min assist.  He is able to perform lateral scoot transfers independently with slight road, requires min assist for sit to stand transfers and for standing.  He is able to propel his wheelchair independently.  Disposition: Skilled nursing facility.  Diet: Renal/carb modified/1200 cc FR/day  Special Instructions: Wash incision with soap and water.  Keep clean and dry.  Cover with shrinker. 2.   Monitor BS ac/hs and use SSI for elevated BS.    Allergies as of 07/08/2022       Reactions   Pioglitazone Swelling   Site of swelling not recalled by patient   Adhesive [tape] Other (See Comments)   Causes redness, pt prefers paper tape    Heparin Other (See Comments)   Per pt he has episodes where he  can't speak. He has tolerated this med multiple times since this entered so unlikely it is true allergy   Latex Rash   Redness, also        Medication List     STOP taking these medications    amoxicillin-clavulanate 500-125 MG tablet Commonly known as: Augmentin   Gerhardt's butt cream Crea   oxyCODONE 5 MG immediate release tablet Commonly known as: Oxy IR/ROXICODONE   zinc sulfate 220 (50 Zn) MG capsule       TAKE these medications    ascorbic acid 1000 MG tablet Commonly known as: VITAMIN C Take 1,000 mg by mouth daily.   atorvastatin 40 MG tablet Commonly known as: LIPITOR Take 1 tablet (40 mg total) by mouth daily.   calcitRIOL 0.25 MCG capsule Commonly known as:  ROCALTROL Take 5 capsules (1.25 mcg total) by mouth every Monday, Wednesday, and Friday with hemodialysis.   carvedilol 25 MG tablet Commonly known as: COREG Take 1 tablet (25 mg total) by mouth 2 (two) times daily with a meal.   co-enzyme Q-10 30 MG capsule Take 30 mg by mouth 2 (two) times daily.   cyanocobalamin 1000 MCG tablet Take 1,000 mcg by mouth daily.   diclofenac Sodium 1 % Gel Commonly known as: VOLTAREN Apply 2 g topically 3 (three) times daily before meals.   gabapentin 100 MG capsule Commonly known as: NEURONTIN Take 2 capsules (200 mg total) by mouth at bedtime.   guaiFENesin 600 MG 12 hr tablet Commonly known as: MUCINEX Take 1 tablet (600 mg total) by mouth 2 (two) times daily.   hydrALAZINE 10 MG tablet Commonly known as: APRESOLINE Take 1 tablet (10 mg total) by mouth every 6 (six) hours as needed (SBP > 180).   insulin aspart 100 UNIT/ML injection Commonly known as: novoLOG Inject 4 Units into the skin 3 (three) times daily with meals.   insulin glargine-yfgn 100 UNIT/ML injection Commonly known as: SEMGLEE Inject 0.04 mLs (4 Units total) into the skin daily.   insulin glargine-yfgn 100 UNIT/ML injection Commonly known as: SEMGLEE Inject 0.12 mLs (12 Units  total) into the skin daily.   irbesartan 75 MG tablet Commonly known as: AVAPRO Take 1 tablet (75 mg total) by mouth daily.   lactobacillus Pack Take 1 packet (1 g total) by mouth 3 (three) times daily with meals.   loperamide 2 MG capsule Commonly known as: IMODIUM Take 1 capsule (2 mg total) by mouth every 8 (eight) hours as needed for diarrhea or loose stools. What changed: when to take this   melatonin 3 MG Tabs tablet Take 1 tablet (3 mg total) by mouth at bedtime.   multivitamin Tabs tablet Take 1 tablet by mouth at bedtime.   Muscle Rub 10-15 % Crea Apply 1 Application topically as needed for muscle pain. To right thigh   nutrition supplement (JUVEN) Pack Take 1 packet by mouth 2 (two) times daily between meals. What changed:  Another medication with the same name was changed. Make sure you understand how and when to take each. Another medication with the same name was removed. Continue taking this medication, and follow the directions you see here.   (feeding supplement) PROSource Plus liquid Take 30 mLs by mouth 2 (two) times daily between meals. What changed:  when to take this Another medication with the same name was removed. Continue taking this medication, and follow the directions you see here.   pantoprazole 40 MG tablet Commonly known as: PROTONIX Take 1 tablet (40 mg total) by mouth daily.   polycarbophil 625 MG tablet Commonly known as: FIBERCON Take 1 tablet (625 mg total) by mouth 2 (two) times daily.   polyethylene glycol powder 17 GM/SCOOP powder Commonly known as: GLYCOLAX/MIRALAX Take 17 g by mouth daily as needed for mild constipation.   torsemide 100 MG tablet Commonly known as: DEMADEX Take 100 mg by mouth See admin instructions. Takes on Tuesday,Thursday,Saturday and Sunday(non-dialysis days)   traZODone 50 MG tablet Commonly known as: DESYREL Take 0.5-1 tablets (25-50 mg total) by mouth at bedtime as needed for sleep.   Velphoro 500  MG chewable tablet Generic drug: sucroferric oxyhydroxide Chew 1,000 mg by mouth 3 (three) times daily with meals.        Contact information for follow-up providers     Dough,  Jaymes Graff, MD Follow up.   Specialty: Family Medicine Why: Call in 1-2 days for post hospital follow up Contact information: [address] [place] [address] [phone]         Jennye Boroughs, MD Follow up.   Specialty: Physical Medicine and Rehabilitation Why: office will call you with follow up appointment Contact information: [address] [place] [address] [phone]         Newt Minion, MD Follow up.   Specialty: Orthopedic Surgery Why: Call in 1-2 days for post hospital follow up Contact information: [place] [address] [phone]              Contact information for after-discharge care     [facility] .   Service: Skilled Nursing Contact information: [address] [place] [address] [phone]                     Signed: Bary Leriche 07/08/2022, 9:55 AM

## 2022-06-30 NOTE — Progress Notes (Signed)
PROGRESS NOTE   Subjective/Complaints: Pt in bathroom. No new issues. Pain better controlled. CBG low at mid day  ROS: Patient denies fever, rash, sore throat, blurred vision, dizziness, nausea, vomiting, diarrhea, cough, shortness of breath or chest pain,   headache, or mood change.   Objective:   No results found.    Recent Labs    06/28/22 0610 06/30/22 0611  WBC 8.8 7.3  HGB 8.0* 8.4*  HCT 26.9* 26.8*  PLT 204 199   Recent Labs    06/28/22 0610 06/30/22 0611  NA 132* 132*  K 4.9 4.9  CL 93* 93*  CO2 25 24  GLUCOSE 137* 149*  BUN 77* 79*  CREATININE 8.46* 8.38*  CALCIUM 9.3 9.4    Intake/Output Summary (Last 24 hours) at 06/30/2022 [id number] Last data filed at 06/29/2022 2020 Gross per 24 hour  Intake 480 ml  Output --  Net 480 ml     Pressure Injury 06/27/22 Heel Right Deep Tissue Pressure Injury - Purple or maroon localized area of discolored intact skin or blood-filled blister due to damage of underlying soft tissue from pressure and/or shear. (Active)  06/27/22 2125  Location: Heel  Location Orientation: Right  Staging: Deep Tissue Pressure Injury - Purple or maroon localized area of discolored intact skin or blood-filled blister due to damage of underlying soft tissue from pressure and/or shear.  Wound Description (Comments):   Present on Admission: No    Physical Exam: Vital Signs Blood pressure 137/72, pulse 79, temperature 98 F (36.7 C), temperature source Oral, resp. rate 19, height 6' (1.829 m), weight 103 kg, SpO2 93 %.   Constitutional: No distress . Vital signs reviewed. HEENT: NCAT, EOMI, oral membranes moist Neck: supple Cardiovascular: RRR without murmur. No JVD    Respiratory/Chest: CTA Bilaterally without wheezes or rales. Normal effort    GI/Abdomen: BS +, non-tender, non-distended Ext: no clubbing, cyanosis, or edema Psych: pleasant and cooperative  Skin: No evidence of  breakdown, no evidence of rash Leg in limb guard Neuro:    Alert and awake, CN 2-12 grossly intact, follows commands    Comments: Decreased to light touch from R mid shin downwards;  Musculoskeletal:  UE strength 5/5 in Biceps, triceps, WE, grip and FA B/L. Strength symmetrical and unchanged in BUE. Subjective sensory loss LUE? RLE- 5/5 in HF, KE, KF- but moderate foot drop- DF 1/5 and PF 1/5 LLE- 4/5 HF, KE     Assessment/Plan: 1. Functional deficits which require 3+ hours per day of interdisciplinary therapy in a comprehensive inpatient rehab setting. Physiatrist is providing close team supervision and 24 hour management of active medical problems listed below. Physiatrist and rehab team continue to assess barriers to discharge/monitor patient progress toward functional and medical goals  Care Tool:  Bathing    Body parts bathed by patient: Right arm, Left arm, Abdomen, Chest, Front perineal area, Right upper leg, Left upper leg, Face, Buttocks, Right lower leg   Body parts bathed by helper: Buttocks, Right lower leg Body parts n/a: Left lower leg   Bathing assist Assist Level: Contact Guard/Touching assist (shower level seated)     Upper Body Dressing/Undressing Upper body dressing  What is the patient wearing?: Pull over shirt    Upper body assist Assist Level: Set up assist    Lower Body Dressing/Undressing Lower body dressing      What is the patient wearing?: Pants, Incontinence brief     Lower body assist Assist for lower body dressing: Moderate Assistance - Patient 50 - 74%     Toileting Toileting    Toileting assist Assist for toileting: Moderate Assistance - Patient 50 - 74%     Transfers Chair/bed transfer  Transfers assist     Chair/bed transfer assist level: Supervision/Verbal cueing     Locomotion Ambulation   Ambulation assist   Ambulation activity did not occur: Safety/medical concerns (Unable to ambulated at this time secondary to  new BKA with global deconditioning)          Walk 10 feet activity   Assist  Walk 10 feet activity did not occur: Safety/medical concerns        Walk 50 feet activity   Assist Walk 50 feet with 2 turns activity did not occur: Safety/medical concerns         Walk 150 feet activity   Assist Walk 150 feet activity did not occur: Safety/medical concerns         Walk 10 feet on uneven surface  activity   Assist Walk 10 feet on uneven surfaces activity did not occur: Safety/medical concerns         Wheelchair     Assist Is the patient using a wheelchair?: Yes Type of Wheelchair: Manual    Wheelchair assist level: Supervision/Verbal cueing Max wheelchair distance: 150    Wheelchair 50 feet with 2 turns activity    Assist        Assist Level: Supervision/Verbal cueing   Wheelchair 150 feet activity     Assist      Assist Level: Supervision/Verbal cueing   Blood pressure 137/72, pulse 79, temperature 98 F (36.7 C), temperature source Oral, resp. rate 19, height 6' (1.829 m), weight 103 kg, SpO2 93 %.  Medical Problem List and Plan: 1. 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  -Continue CIR therapies including PT and OT. Interdisciplinary team conference today to discuss goals, barriers to discharge, and dc planning.    -wound vac was discontinued 3/4      2.  Antithrombotics: -DVT/anticoagulation:  Pharmaceutical: Heparin             -antiplatelet therapy: NA 3. Pain Management: Oxycodone prn for pain. No phantom pain so far  -muscle rub for R thigh started- pain improved  3/12 gabapentin '200mg'$  qhs helpful for LUE, no SE  3/13 pain controlled 4. Mood/Behavior/Sleep: LCSW to follow for evaluation and support.              -antipsychotic agents: N/A  -3/8 Melatonin '3mg'$  HS  started 5. Neuropsych/cognition: This patient is capable of making decisions on his own behalf. 6. Skin/Wound Care: Routine pressure relief measures.   -continue shrinker to left BK stump 7. Fluids/Electrolytes/Nutrition: Strict I/O. Daily weights. Enforce 1200 cc FR.             --Reports on multiple supplements--d/c nephro.  --Continue Juven bid, Zinc and Vitamin-C to promote wound  healing.  -Hyponatremia- improved on labs 3/4 to 131 8. Left calf staph positive cultures: IV antibiotics changed to Augmentin X 19 days per recs.  (stop 07/03/22) 9. HTN: Monitor BP TID--continue Avapro (reduced to '75mg'$  2/27) and Demadex -STTS.   -3/7 irbesartan was previously decreased to allow for more volume to be reduced by nephrology, continue to monitor, BP mildy elevated but stable overall, hesitant to increase BP medications due to dialysis, if BP increases further may need increase irbesartan, Add hydralazine PRN SBP > 180  -3/13 BP with fair control, continue current regimen, parameters placed for PT/OT     06/29/2022    6:43 PM 06/29/2022    2:40 PM 06/29/2022    4:47 AM  Vitals with BMI  Weight   227 lbs 1 oz  BMI   [id number]  Systolic 0000000 0000000   Diastolic 72 78   Pulse 79 78     10. T2DM: Hgb A1C- 7.4. Was on Glimepiride and Rubelsus PTA.  -- Monitor BS ac/hs and use SSI for tighter control.         -2/28 glargine increase to 8 units yesterday, SSI changed to sensitive. CBGs elevated, Monitor        -continue Insulin glargine w/ 2 units novolog TID AC ---> increase to 3u TID (3/1)  -3/4 Increase glargine to 12 units from 10 units  -3/6 increase novolog to 4 units TID AC  -3/10 cbg's higher in AM, probably would benefit from bid glarcine   -will change to 10 qam and 4u qpm and observe   -continue scheduled novolog  CBG (last 3)  Recent Labs    06/29/22 2058 06/29/22 2256 06/30/22 0621  GLUCAP 114* 134* 148*   3/12 cbgs still higher in PM--will increase am semglee to 12u  3/13 cbgs better  but lower now at mid day. Needs to make sure he's eating adequately at breakfast.  11. ESRD: Volume being managed with HD MWF. Is basically anuric- voided x2 since admission. Via fistula --  HD at the end of the day to help with tolerance of therapy.  12.  Leucocytosis due to LLE osteomyelitis- : Monitor for fevers and other signs of infection.              --   -3/13 WBC down to 7.3, improved 13. Hyponatremia: Being managed with HD.  14. Cough: resolved  15. Right hip pain: Fall 3 weeks PTA. Will order Voltaren gel TID.  16. Loose stools- due to IV ABX per pt- might need a stool bulker/fiber-   -3/1 Fibercon started '625mg'$  BID  -3/12 lg type 6 stool   LOS: 15 days A FACE TO FACE EVALUATION WAS PERFORMED  Meredith Staggers 06/30/2022, 8:29 AM

## 2022-06-30 NOTE — Progress Notes (Signed)
Occupational Therapy Weekly Progress Note  Patient Details  Name: Scott Castillo MRN: [id number] Date of Birth: 08-Sep-1960  Beginning of progress report period: June 23 2022  End of progress report period: June 30, 2022  Today's Date: 06/30/2022 OT Individual Time: 1005-1100 OT Individual Time Calculation (min): 55 min    Patient has met 0 of 3 short term goals.  Scott Castillo has had a slow week of progress but remains highly motivated and is working very hard to reach his goals. He can complete UB ADLs with (S), LB with mod A. He can complete squat pivot transfers with CGA, approaching (S). Mod-max A to stand. Pt is now requesting to d/c to SNF d/t inaccessible home environment.   Patient continues to demonstrate the following deficits: muscle weakness, decreased cardiorespiratoy endurance, decreased coordination, and decreased sitting balance, decreased standing balance, decreased postural control, and decreased balance strategies and therefore will continue to benefit from skilled OT intervention to enhance overall performance with BADL and Reduce care partner burden.  Patient not progressing toward long term goals.  See goal revision..  Plan of care revisions: Sit <> stand downgraded to min A.  OT Short Term Goals Week 2:  OT Short Term Goal 1 (Week 2): Pt will stand from w/c with min A OT Short Term Goal 1 - Progress (Week 2): Progressing toward goal OT Short Term Goal 2 (Week 2): Pt will complete stand pivot transfer with mod A OT Short Term Goal 2 - Progress (Week 2): Progressing toward goal OT Short Term Goal 3 (Week 2): Pt will complete toileting tasks with min A overall OT Short Term Goal 3 - Progress (Week 2): Progressing toward goal Week 3:  OT Short Term Goal 1 (Week 3): STG= LTG d/t SNF d/c  Skilled Therapeutic Interventions/Progress Updates:    Pt received supine with no c/o pain, agreeable to OT session. He came to EOB with mod I using bed rail. Squat pivot with (S) to the w/c.  He propelled the w/c 120 ft to the therapy gym with no rest break for improving UE endurance. He attempted to stand from the w/c with the RW x5 attempts with max A. Increased fatigue. He was eventually able to stand with max A and then only min-mod A when standing statically. Min cueing for LLE extension. He was able to alternate BUE removal from the RW with mod A. He transitioned into sidelying on the mat with CGA. He completed LLE hip abduction and extension to promote glute and hip strengthening to promote improved residual limb positioning. 3x10 repetitions. He was able to tolerate prone much better today and able to maintain the position statically and dynamically for 5+ minutes. He completed prone push ups to address UE stability and endurance. He returned to his w/c and to his room. He was left sitting up with all needs met.    Therapy Documentation Precautions:  Precautions Precautions: Fall Precaution Comments: L BKA Other Brace: L limb protector Restrictions Weight Bearing Restrictions: No LLE Weight Bearing: Partial weight bearing  Therapy/Group: Individual Therapy  Curtis Sites 06/30/2022, 6:46 AM

## 2022-07-01 LAB — GLUCOSE, CAPILLARY
Glucose-Capillary: 150 mg/dL — ABNORMAL HIGH (ref 70–99)
Glucose-Capillary: 75 mg/dL (ref 70–99)
Glucose-Capillary: 154 mg/dL — ABNORMAL HIGH (ref 70–99)
Glucose-Capillary: 152 mg/dL — ABNORMAL HIGH (ref 70–99)

## 2022-07-01 MED ORDER — CHLORHEXIDINE GLUCONATE CLOTH 2 % EX PADS
6.0000 | MEDICATED_PAD | Freq: Every day | CUTANEOUS | Status: DC
  Administered 2022-07-03: 6 via TOPICAL

## 2022-07-01 NOTE — Progress Notes (Signed)
Orthopedic Tech Progress Note Patient Details:  Scott Castillo 1960/08/19 [id number] Called in order to [facility] for Shrinkers Patient ID: Scott Castillo, male   DOB: 1960-08-06, 62 y.o.   MRN: [id number]  [name] 07/01/2022, 10:25 AM

## 2022-07-01 NOTE — Progress Notes (Signed)
PROGRESS NOTE   Subjective/Complaints: Pt in good spirits. Pain well controlled. Going to gym with PT when I caught him.   ROS: Patient denies fever, rash, sore throat, blurred vision, dizziness, nausea, vomiting, diarrhea, cough, shortness of breath or chest pain, joint or back/neck pain, headache, or mood change.   Objective:   No results found.    Recent Labs    06/30/22 0611  WBC 7.3  HGB 8.4*  HCT 26.8*  PLT 199   Recent Labs    06/30/22 0611  NA 132*  K 4.9  CL 93*  CO2 24  GLUCOSE 149*  BUN 79*  CREATININE 8.38*  CALCIUM 9.4    Intake/Output Summary (Last 24 hours) at 07/01/2022 1020 Last data filed at 07/01/2022 0735 Gross per 24 hour  Intake 280 ml  Output 4 ml  Net 276 ml     Pressure Injury 06/27/22 Heel Right Deep Tissue Pressure Injury - Purple or maroon localized area of discolored intact skin or blood-filled blister due to damage of underlying soft tissue from pressure and/or shear. (Active)  06/27/22 2125  Location: Heel  Location Orientation: Right  Staging: Deep Tissue Pressure Injury - Purple or maroon localized area of discolored intact skin or blood-filled blister due to damage of underlying soft tissue from pressure and/or shear.  Wound Description (Comments):   Present on Admission: No    Physical Exam: Vital Signs Blood pressure 138/75, pulse 70, temperature 97.8 F (36.6 C), temperature source Oral, resp. rate 16, height 6' (1.829 m), weight 102.6 kg, SpO2 94 %.   Constitutional: No distress . Vital signs reviewed. HEENT: NCAT, EOMI, oral membranes moist Neck: supple Cardiovascular: RRR without murmur. No JVD    Respiratory/Chest: CTA Bilaterally without wheezes or rales. Normal effort    GI/Abdomen: BS +, non-tender, non-distended Ext: no clubbing, cyanosis, or edema Psych: pleasant and cooperative  Skin: R heel wound, scattered abrasions proximally.  Left  leg:     Neuro:    Alert and awake, CN 2-12 grossly intact, follows commands    Comments: Decreased to light touch from R mid shin downwards;  Musculoskeletal:  UE strength 5/5 in Biceps, triceps, WE, grip and FA B/L. Strength symmetrical and unchanged in BUE. Subjective sensory loss LUE? RLE- 5/5 in HF, KE, KF- but moderate foot drop- DF 1/5 and PF 1/5 LLE- 4/5 HF, KE     Assessment/Plan: 1. Functional deficits which require 3+ hours per day of interdisciplinary therapy in a comprehensive inpatient rehab setting. Physiatrist is providing close team supervision and 24 hour management of active medical problems listed below. Physiatrist and rehab team continue to assess barriers to discharge/monitor patient progress toward functional and medical goals  Care Tool:  Bathing    Body parts bathed by patient: Right arm, Left arm, Abdomen, Chest, Front perineal area, Right upper leg, Left upper leg, Face, Buttocks, Right lower leg   Body parts bathed by helper: Buttocks, Right lower leg Body parts n/a: Left lower leg   Bathing assist Assist Level: Contact Guard/Touching assist (shower level seated)     Upper Body Dressing/Undressing Upper body dressing   What is the patient wearing?: Pull over shirt  Upper body assist Assist Level: Set up assist    Lower Body Dressing/Undressing Lower body dressing      What is the patient wearing?: Pants, Incontinence brief     Lower body assist Assist for lower body dressing: Moderate Assistance - Patient 50 - 74%     Toileting Toileting    Toileting assist Assist for toileting: Moderate Assistance - Patient 50 - 74%     Transfers Chair/bed transfer  Transfers assist     Chair/bed transfer assist level: Supervision/Verbal cueing     Locomotion Ambulation   Ambulation assist   Ambulation activity did not occur: Safety/medical concerns (Unable to ambulated at this time secondary to new BKA with global deconditioning)           Walk 10 feet activity   Assist  Walk 10 feet activity did not occur: Safety/medical concerns        Walk 50 feet activity   Assist Walk 50 feet with 2 turns activity did not occur: Safety/medical concerns         Walk 150 feet activity   Assist Walk 150 feet activity did not occur: Safety/medical concerns         Walk 10 feet on uneven surface  activity   Assist Walk 10 feet on uneven surfaces activity did not occur: Safety/medical concerns         Wheelchair     Assist Is the patient using a wheelchair?: Yes Type of Wheelchair: Manual    Wheelchair assist level: Supervision/Verbal cueing Max wheelchair distance: 150    Wheelchair 50 feet with 2 turns activity    Assist        Assist Level: Supervision/Verbal cueing   Wheelchair 150 feet activity     Assist      Assist Level: Supervision/Verbal cueing   Blood pressure 138/75, pulse 70, temperature 97.8 F (36.6 C), temperature source Oral, resp. rate 16, height 6' (1.829 m), weight 102.6 kg, SpO2 94 %.  Medical Problem List and Plan: 1. 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  --Continue CIR therapies including PT, OT     -wound vac was discontinued 3/4      2.  Antithrombotics: -DVT/anticoagulation:  Pharmaceutical: Heparin             -antiplatelet therapy: NA 3. Pain Management: Oxycodone prn for pain. No phantom pain so far  -muscle rub for R thigh started- pain improved  3/12 gabapentin '200mg'$  qhs helpful for LUE, no SE  3/13 pain controlled 4. Mood/Behavior/Sleep: LCSW to follow for evaluation and support.              -antipsychotic agents: N/A  -3/8 Melatonin '3mg'$  HS started 5. Neuropsych/cognition: This patient is capable of making decisions on his own behalf. 6. Skin/Wound Care: Routine pressure  relief measures.   -ordered smaller 2X shrinkers per [facility]  -reached out to ortho see if he felt pt ready for staples to come out given he's almost 3 weeks post op. 7. Fluids/Electrolytes/Nutrition: Strict I/O. Daily weights. Enforce 1200 cc FR.             --Reports on multiple supplements--d/c nephro.  --Continue Juven bid, Zinc and Vitamin-C to promote wound  healing.  -Hyponatremia- improved on labs 3/4 to 131 8. Left calf staph positive cultures: IV antibiotics changed to Augmentin X 19 days per recs.  (stop 07/03/22) 9. HTN: Monitor BP TID--continue Avapro (reduced to '75mg'$  2/27) and Demadex -STTS.   -3/7 irbesartan was previously decreased to allow for more volume to be reduced by nephrology, continue to monitor, BP mildy elevated but stable overall, hesitant to increase BP medications due to dialysis, if BP increases further may need increase irbesartan, Add hydralazine PRN SBP > 180  -3/13-14 BP with fair to borderlinecontrol, continue current regimen, parameters placed for PT/OT     07/01/2022    6:40 AM 07/01/2022    5:40 AM 06/30/2022    7:28 PM  Vitals with BMI  Weight 226 lbs 3 oz    BMI [id number]    Systolic  [id number]  Diastolic  75 95  Pulse  70 76    10. T2DM: Hgb A1C- 7.4. Was on Glimepiride and Rubelsus PTA.  -- Monitor BS ac/hs and use SSI for tighter control.         -2/28 glargine increase to 8 units yesterday, SSI changed to sensitive. CBGs elevated, Monitor        -continue Insulin glargine w/ 2 units novolog TID AC ---> increase to 3u TID (3/1)  -3/4 Increase glargine to 12 units from 10 units  -3/6 increase novolog to 4 units TID AC  -3/10 cbg's higher in AM, probably would benefit from bid glarcine   -will change to 10 qam and 4u qpm and observe   -continue scheduled novolog  CBG (last 3)  Recent Labs    06/30/22 1845 06/30/22 2102 07/01/22 0626  GLUCAP 167* 150* 150*   3/12 cbgs still higher in PM--will increase am semglee to 12u  3/14 cbg's leveling out.  Monitor for today 11. ESRD: Volume being managed with HD MWF. Is basically anuric- voided x2 since admission. Via fistula --  HD at the end of the day to help with tolerance of therapy.  12.  Leucocytosis due to LLE osteomyelitis- : Monitor for fevers and other signs of infection.              --   -3/13 WBC down to 7.3, improved 13. Hyponatremia: Being managed with HD.  14. Cough: resolved  15. Right hip pain: Fall 3 weeks PTA. Will order Voltaren gel TID.  16. Loose stools- due to IV ABX per pt- might need a stool bulker/fiber-   -3/1 Fibercon started '625mg'$  BID  -3/12 lg type 6 stool   LOS: 16 days A FACE TO [place] T Jermani Pund 07/01/2022, 10:20 AM

## 2022-07-01 NOTE — Progress Notes (Signed)
Physical Therapy Session Note  Patient Details  Name: Scott Castillo MRN: [id number] Date of Birth: [birth year]  Today's Date: 07/01/2022 PT Individual Time: 1057-1205 PT Individual Time Calculation (min): 68 min   Short Term Goals: Week 2:  PT Short Term Goal 1 (Week 2): Patient will perform bed<>chair transfer at [facility] level with slide board. PT Short Term Goal 2 (Week 2): Patient will manage WC brakes, leg rests, armrests with supervision/cues only for safe propulsion and WC transfers. PT Short Term Goal 3 (Week 2): Pt will self propel WC 100' with bil UE and no seated rest break with supervision only.  Skilled Therapeutic Interventions/Progress Updates: Pt presented in w/c agreeable to therapy. Pt states staples do not hurt as much as they have been. Pt propelled to [facility] elevators with supervision. Pt noted to have improved tolerance to propulsion with textured surface on w/c rims. Pt transported remaining distance to [facility] entrance. At [facility] pt participated in standing tolerance activity at small planter wall. Pt required modA to stand from w/c but was able to tolerate x 2 stands with at ~2:30 and 3:30 with pt engaging in meaningful converstaion as distraction. Pt was able to support self with CGA and with no knee buckling noted. Pt then propelled outside in community setting using BUE and with increased time and rest breaks was able to propel >23f incorporating small grades as well as propelling over thresholds. Once inside pt transported back to unit for energy conservation and transported to main gym. Pt was able to release and remove ELR/amputee pad with supervision and increased time! Pt then participated in mat exercises for residual limb strengthening/ROM as follows: LLE SLR 2 x 10, LLE SAQ 2 x 10, RLE SAQ with 7lb weighted cuff 2 x 10, modified bridges off bolster 2 x 10, sidelying hip abd  LLE 2 x 10, prone hip extension LLE x 10 but transitioned to sidelying due to decreased tolerance for  prone. Pt was able to transition sidelying/supine/prone mod I. Pt returned to sitting EOM and performed lateral scoot transfer with supervision back to w/c. Pt propelled back to room with supervision and remained in w/c at end of session to eat lunch with call bell within reach and needs met.      Therapy Documentation Precautions:  Precautions Precautions: Fall Precaution Comments: L BKA Other Brace: L limb protector Restrictions Weight Bearing Restrictions: No LLE Weight Bearing: Partial weight bearing   Therapy/Group: Individual Therapy  Shavonda Wiedman 07/01/2022, 12:29 PM

## 2022-07-01 NOTE — Progress Notes (Signed)
Occupational Therapy Session Note  Patient Details  Name: Scott Castillo MRN: [id number] Date of Birth: 1960/08/13  Today's Date: 07/01/2022 OT Individual Time: 0930-1030 OT Individual Time Calculation (min): 60 min    Short Term Goals: Week 1:  OT Short Term Goal 1 (Week 1): Pt will stand from EOB with mod A OT Short Term Goal 1 - Progress (Week 1): Met OT Short Term Goal 2 (Week 1): Pt will don pants sit <> stand with mod A using LRAD OT Short Term Goal 2 - Progress (Week 1): Met OT Short Term Goal 3 (Week 1): Pt will complete stand pivot transfer with mod A OT Short Term Goal 3 - Progress (Week 1): Progressing toward goal Week 2:  OT Short Term Goal 1 (Week 2): Pt will stand from w/c with min A OT Short Term Goal 1 - Progress (Week 2): Progressing toward goal OT Short Term Goal 2 (Week 2): Pt will complete stand pivot transfer with mod A OT Short Term Goal 2 - Progress (Week 2): Progressing toward goal OT Short Term Goal 3 (Week 2): Pt will complete toileting tasks with min A overall OT Short Term Goal 3 - Progress (Week 2): Progressing toward goal  Skilled Therapeutic Interventions/Progress Updates:    1:1 Pt received on the toilet finishing having a bowel movement. Pt able to complete hyienge with setup. PT doffed LB clothing with min guard. PT performed scoot pivot transfer back to w/c on right side with contact guard. PT transferred into shower to tub bench with min guard scoot pivot with extra time. Residual limb covered. Pt able to bathe with lateral leans for cleaning periarea and buttocks. Pt did need A with washing right foot.  Pt performed scoot pivot with min guard. Pt able to don UB clothing with setup; and able to thread LB clothing with setup.  Applied lotion and protective bandages to right LE. Pt required A to don sock and shoe on right due to back discomfort. Sit to stand at the sink with mod A with extra time to come into full trunk and hip extension. Grooming at the  sink in sitting mod I.  Provided some more [place] exercises with red theraputty for hand coordination and strengthening. Left sitting up in the w/c with call bell.   Therapy Documentation Precautions:  Precautions Precautions: Fall Precaution Comments: L BKA Other Brace: L limb protector Restrictions Weight Bearing Restrictions: No LLE Weight Bearing: Partial weight bearing General:   Vital Signs:   Pain:   ADL: ADL Eating: Set up Where Assessed-Eating: Bed level Grooming: Supervision/safety Where Assessed-Grooming: Wheelchair Upper Body Bathing: Supervision/safety Where Assessed-Upper Body Bathing: Edge of bed Lower Body Bathing: Moderate assistance Where Assessed-Lower Body Bathing: Bed level, Edge of bed Upper Body Dressing: Supervision/safety Where Assessed-Upper Body Dressing: Edge of bed Lower Body Dressing: Moderate assistance Where Assessed-Lower Body Dressing: Edge of bed Where Assessed-Toileting: Bedside Commode Toilet Transfer: Moderate assistance Toilet Transfer Method: Other (comment) (stedy) Vision   Perception    Praxis   Balance   Exercises:   Other Treatments:     Therapy/Group: Individual Therapy  Willeen Cass [facility] 07/01/2022, 9:48 AM

## 2022-07-01 NOTE — Progress Notes (Addendum)
[facility] Progress Note   Subjective:   Patient seen and examined at bedside.  No specific complaints.  Denies CP, SOB, abdominal pain and n/v/d.  Reports alarming a lot during HD yesterday. No specific issues reported from nursing staff.   Objective Vitals:   06/30/22 1847 06/30/22 1928 07/01/22 0540 07/01/22 0640  BP: (!) 144/79 (!) 164/95 138/75   Pulse: 85 76 70   Resp: 18 16    Temp: 97.7 F (36.5 C) 97.6 F (36.4 C) 97.8 F (36.6 C)   TempSrc: Oral Oral Oral   SpO2: 96% 99% 94%   Weight:    102.6 kg  Height:       Physical Exam General:chronically ill appearing male in NAD Heart:RRR, no mrg Lungs:CTAB, nml WOB on RA Abdomen:soft, NTND Extremities:trace RLE edema, L BKA Dialysis Access: LU AVF +b/t   Filed Weights   06/30/22 1351 06/30/22 1800 07/01/22 0640  Weight: 103.5 kg 101.5 kg 102.6 kg    Intake/Output Summary (Last 24 hours) at 07/01/2022 0903 Last data filed at 07/01/2022 0735 Gross per 24 hour  Intake 280 ml  Output 4 ml  Net 276 ml    Additional Objective Labs: Basic Metabolic Panel: Recent Labs  Lab 06/25/22 1209 06/28/22 0610 06/30/22 0611  NA 130* 132* 132*  K 5.1 4.9 4.9  CL 92* 93* 93*  CO2 '25 25 24  '$ GLUCOSE 98 137* 149*  BUN 73* 77* 79*  CREATININE 7.81* 8.46* 8.38*  CALCIUM 9.2 9.3 9.4  PHOS 5.2* 5.3* 5.2*   Liver Function Tests: Recent Labs  Lab 06/25/22 1209 06/28/22 0610 06/30/22 0611  ALBUMIN 2.2* 2.2* 2.3*   CBC: Recent Labs  Lab 06/25/22 1209 06/28/22 0610 06/30/22 0611  WBC 11.5* 8.8 7.3  NEUTROABS  --  7.3  --   HGB 8.5* 8.0* 8.4*  HCT 26.9* 26.9* 26.8*  MCV 88.5 90.9 89.0  PLT 225 204 199   CBG: Recent Labs  Lab 06/30/22 1158 06/30/22 1735 06/30/22 1845 06/30/22 2102 07/01/22 0626  GLUCAP 140* 187* 167* 150* 150*   Medications:   (feeding supplement) PROSource Plus  30 mL Oral BID BM   amoxicillin-clavulanate  1 tablet Oral q1800   vitamin C  1,000 mg Oral Daily    atorvastatin  40 mg Oral Daily   calcitRIOL  1.25 mcg Oral Q M,W,F-HD   carvedilol  25 mg Oral BID WC   Chlorhexidine Gluconate Cloth  6 each Topical Q0600   Chlorhexidine Gluconate Cloth  6 each Topical Q0600   darbepoetin (ARANESP) injection - DIALYSIS  200 mcg Subcutaneous Q Tue-1800   diclofenac Sodium  2 g Topical TID AC   gabapentin  200 mg Oral QHS   Gerhardt's butt cream   Topical BID   guaiFENesin  600 mg Oral BID   heparin  5,000 Units Subcutaneous Q8H   insulin aspart  0-5 Units Subcutaneous QHS   insulin aspart  0-9 Units Subcutaneous TID with meals   insulin aspart  4 Units Subcutaneous TID WC   insulin glargine-yfgn  12 Units Subcutaneous Daily   And   insulin glargine-yfgn  4 Units Subcutaneous Daily   irbesartan  75 mg Oral Daily   lactobacillus  1 g Oral TID WC   melatonin  3 mg Oral QHS   multivitamin  1 tablet Oral QHS   pantoprazole  40 mg Oral Daily   polycarbophil  625 mg Oral BID   sucroferric oxyhydroxide  1,000 mg Oral TID WC  torsemide  100 mg Oral Once per day on Sun Tue Thu Sat    Dialysis Orders: [facility] MWF 4h 26mn  400/800   108.5kg   2/2 bath  LFA AVF   - No Heparin  - rocaltrol 1.25 mcg po tiw - mircera 225 mcg IV  q 2wks, last 2/12, due 2/26 - last Hb 8.6 on 2/14   Assessment/Plan: Leukocytosis/L foot 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 On Augmentin.  Hyponatremia - worsening in the setting of hypervolemia. managing with HD/UF. Fluid restriction reinforced. Resolved, Na+ 132.  ESRD - on HD MWF. HD tomorrow per regular schedule.  K improved with renal diet.  HTN/ volume - Getting under edw, continues to have edema, UF as tolerated.  EDW should be lower on d/c post BKA, last post wt 101.5kg. BP in goal today with decreased irbesartan 75 mg PO daily started on 06/16/22. On hydralazine prn for SBP>180.  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 s/p 1 unit prbcs 2/22. On Aranesp 2065m qTues starting 06/22/22. Check iron panel when he completes ABX.  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 Cont po vdra and velphoro as binder.  DM2 - Chronic hyperglycemia, poorly controlled DMT2. PMD adjusting meds.  Nutrition - hypoalbuminemia- Continue protein supplement. Fluid restriction  LiJen MowPA-C [facility] 07/01/2022,9:03 AM  LOS: 16 days    Seen and examined independently.  Agree with note and exam as documented above by physician extender and as noted here.  Seen in the hall with PT.  He shares that they let him go outside.  His energy level and stamina is improved per patient and therapy agrees  General adult male in bed in no acute distress HEENT normocephalic atraumatic extraocular movements intact sclera anicteric Neck supple trachea midline Lungs clear to auscultation bilaterally normal work of breathing at rest  Heart regular rate and rhythm no rubs or gallops appreciated Abdomen soft nontender nondistended Extremities no to trace edema remaining right leg.  Left BKA Psych normal mood and affect Neuro alert and conversant; provides hx and follows commands  Access LUE AVF bruit and thrill   ESRD - on HD per MWF schedule. Renal panel in AM   Left foot abscess - abx per primary team. S/p left BKA   HTN - optimize volume with HD; note that he will have a new EDW s/p amputation. S/p reduction in irbesartan per charting   Anemia CKD - on aranesp 200 mcg weekly.  CBC in AM    LoClaudia DesanctisMD 07/01/2022  11:36 AM

## 2022-07-01 NOTE — Progress Notes (Addendum)
Patient ID: Scott Castillo, male   DOB: 09-02-1960, 62 y.o.   MRN: [id number] Have reached out to [facility] to see if can offer pt a bed. Awaiting response, will update pt and see what is his plan then  9:08 AM heard from [facility] who reports they can not offer a bed to pt. Will let pt know this information, had already expanded search for a bed  2:21 PM Have let pt know [facility] can not offer him a bed and have sent out to other facilities.

## 2022-07-01 NOTE — Progress Notes (Signed)
Physical Therapy Session Note  Patient Details  Name: Scott Castillo MRN: [id number] Date of Birth: [birth year]  Today's Date: 07/01/2022 PT Individual Time: 0810-0922 PT Individual Time Calculation (min): 72 min   Short Term Goals: Week 2:  PT Short Term Goal 1 (Week 2): Patient will perform bed<>chair transfer at [facility] level with slide board. PT Short Term Goal 2 (Week 2): Patient will manage WC brakes, leg rests, armrests with supervision/cues only for safe propulsion and WC transfers. PT Short Term Goal 3 (Week 2): Pt will self propel WC 100' with bil UE and no seated rest break with supervision only.  Skilled Therapeutic Interventions/Progress Updates: Pt was received supine in bed with nursing staff present to administer medicine and was agreeable to participate in PT. Pt performed lateral scoot from EOB<WC with supervision for safety. Min A required for proper WC management. Pt self propelled WC to main gym and performed lateral transfer from WC<hi/low mat table. VC's required for proper sequence of each intervention. Pt required rest breaks due to fatigue. Manual ITB stretch applied this session per patient report of pain alleviation from previous session. Pt required multimodal cues for STS in order to keep balance while in standing. Further standing activities withheld this session due to time and fatigue. Therapist propelled pt back to room and requested to be left on the toilet prior to OT session. Nursing staff alerted to pt present location in bathroom.   Therapy Documentation Precautions:  Precautions Precautions: Fall Precaution Comments: L BKA Other Brace: L limb protector Restrictions Weight Bearing Restrictions: No LLE Weight Bearing: Partial weight bearing   Exercises:  Seated truncal rotation 2x10 with 6lb medicine ball 3kg ball straight throw/catch 2 x 10 3kg ball throw/catch with truncal rotation R LE LAQ with 5lb ankle weight (3sec isometric hold in full  extension) Manual R ITB stretch 3sec x 3 2 STS from 22" mat table to RW (min-mod A)   Other Treatments:      Therapy/Group: Individual Therapy  Kylin Dubs, PTA 07/01/2022, 10:53 AM

## 2022-07-01 NOTE — Progress Notes (Signed)
Physical Therapy Weekly Progress Note  Patient Details  Name: Scott Castillo MRN: [id number] Date of Birth: 09/26/1960  Beginning of progress report period: June 25, 2022 End of progress report period: July 01, 2022  Today's Date: 07/01/2022   Patient has met 2 of 3 short term goals.  Pt has made steady gains during current week of therapy. Pt is now consistently able to perform lateral scoot and squat pivot transfers with supervision. He has also made improvements in w/c management however continues to require some assist with ELR and amputee pad management due to decreased dexterity in B hands. Pt has demonstrated improvements in mobility and has able to increase propulsion distance with decreased breaks as co-band was placed on w/c rims to allow greater grip. Pt education is ongoing in terms of balance, standing tolerance, and pre-gait activities.   Patient continues to demonstrate the following deficits muscle weakness and muscle joint tightness and decreased cardiorespiratoy endurance and therefore will continue to benefit from skilled PT intervention to increase functional independence with mobility.  Patient progressing toward long term goals.  Continue plan of care. Patient's LTG's reviewed and bed<>chair transfer goal upgraded to supervision level with use of slide board. Pt will be discharging as a WC user for mobility and ambulation goal discharged as it is no longer appropriate.  PT Short Term Goals Week 2:  PT Short Term Goal 1 (Week 2): Patient will perform bed<>chair transfer at [facility] level with slide board. PT Short Term Goal 1 - Progress (Week 2): Met PT Short Term Goal 2 (Week 2): Patient will manage WC brakes, leg rests, armrests with supervision/cues only for safe propulsion and WC transfers. PT Short Term Goal 2 - Progress (Week 2): Progressing toward goal PT Short Term Goal 3 (Week 2): Pt will self propel WC 100' with bil UE and no seated rest break with supervision  only. PT Short Term Goal 3 - Progress (Week 2): Met Week 3:         Therapy Documentation Precautions:  Precautions Precautions: Fall Precaution Comments: L BKA Other Brace: L limb protector Restrictions Weight Bearing Restrictions: No LLE Weight Bearing: Partial weight bearing General:   Vital Signs: Therapy Vitals Temp: 97.8 F (36.6 C) Temp Source: Oral Pulse Rate: 74 Resp: 17 BP: 122/78 Patient Position (if appropriate): Sitting Oxygen Therapy SpO2: 96 % O2 Device: Room Air   Therapy/Group: Individual Therapy  Rosita DeChalus 07/01/2022, 2:14 PM    [place], DPT Acute Rehabilitation Services Office [phone]  07/02/22 12:52 PM

## 2022-07-01 NOTE — Progress Notes (Signed)
Left leg staples removed per order. Patient tolerated.

## 2022-07-02 LAB — RENAL FUNCTION PANEL
Albumin: 2.6 g/dL — ABNORMAL LOW (ref 3.5–5.0)
Anion gap: 14 (ref 5–15)
BUN: 71 mg/dL — ABNORMAL HIGH (ref 6–20)
CO2: 23 mmol/L (ref 22–32)
Calcium: 9.5 mg/dL (ref 8.9–10.3)
Chloride: 94 mmol/L — ABNORMAL LOW (ref 98–111)
Creatinine, Ser: 7.94 mg/dL — ABNORMAL HIGH (ref 0.61–1.24)
GFR, Estimated: 7 mL/min — ABNORMAL LOW (ref >60)
Glucose, Bld: 84 mg/dL (ref 70–99)
Phosphorus: 4.8 mg/dL — ABNORMAL HIGH (ref 2.5–4.6)
Potassium: 4.7 mmol/L (ref 3.5–5.1)
Sodium: 131 mmol/L — ABNORMAL LOW (ref 135–145)

## 2022-07-02 LAB — GLUCOSE, CAPILLARY
Glucose-Capillary: 138 mg/dL — ABNORMAL HIGH (ref 70–99)
Glucose-Capillary: 70 mg/dL (ref 70–99)
Glucose-Capillary: 210 mg/dL — ABNORMAL HIGH (ref 70–99)
Glucose-Capillary: 230 mg/dL — ABNORMAL HIGH (ref 70–99)

## 2022-07-02 LAB — CBC
HCT: 29.3 % — ABNORMAL LOW (ref 39.0–52.0)
Hemoglobin: 8.7 g/dL — ABNORMAL LOW (ref 13.0–17.0)
MCH: 27.3 pg (ref 26.0–34.0)
MCHC: 29.7 g/dL — ABNORMAL LOW (ref 30.0–36.0)
MCV: 91.8 fL (ref 80.0–100.0)
Platelets: 230 10*3/uL (ref 150–400)
RBC: 3.19 MIL/uL — ABNORMAL LOW (ref 4.22–5.81)
RDW: 18 % — ABNORMAL HIGH (ref 11.5–15.5)
WBC: 8.6 10*3/uL (ref 4.0–10.5)
nRBC: 0 % (ref 0.0–0.2)

## 2022-07-02 MED ORDER — PENTAFLUOROPROP-TETRAFLUOROETH EX AERO: 1.0000 | INHALATION_SPRAY | CUTANEOUS | Status: DC | PRN

## 2022-07-02 MED ORDER — LIDOCAINE-PRILOCAINE 2.5-2.5 % EX CREA: 1.0000 | TOPICAL_CREAM | CUTANEOUS | Status: DC | PRN

## 2022-07-02 MED ORDER — LIDOCAINE HCL (PF) 1 % IJ SOLN: 5.0000 mL | INTRAMUSCULAR | Status: DC | PRN

## 2022-07-02 NOTE — Progress Notes (Addendum)
PROGRESS NOTE   Subjective/Complaints: Pt up on mat with PT. No new issues today. Staples out without problem.   ROS: Patient denies fever, rash, sore throat, blurred vision, dizziness, nausea, vomiting, diarrhea, cough, shortness of breath or chest pain,   headache, or mood change.   Objective:   No results found.    Recent Labs    06/30/22 0611  WBC 7.3  HGB 8.4*  HCT 26.8*  PLT 199   Recent Labs    06/30/22 0611  NA 132*  K 4.9  CL 93*  CO2 24  GLUCOSE 149*  BUN 79*  CREATININE 8.38*  CALCIUM 9.4    Intake/Output Summary (Last 24 hours) at 07/02/2022 1015 Last data filed at 07/02/2022 0804 Gross per 24 hour  Intake 840 ml  Output --  Net 840 ml     Pressure Injury 06/27/22 Heel Right Deep Tissue Pressure Injury - Purple or maroon localized area of discolored intact skin or blood-filled blister due to damage of underlying soft tissue from pressure and/or shear. (Active)  06/27/22 2125  Location: Heel  Location Orientation: Right  Staging: Deep Tissue Pressure Injury - Purple or maroon localized area of discolored intact skin or blood-filled blister due to damage of underlying soft tissue from pressure and/or shear.  Wound Description (Comments):   Present on Admission: No    Physical Exam: Vital Signs Blood pressure (!) 144/86, pulse 84, temperature (!) 97.3 F (36.3 C), temperature source Oral, resp. rate 16, height 6' (1.829 m), weight 102.6 kg, SpO2 95 %.   Constitutional: No distress . Vital signs reviewed. HEENT: NCAT, EOMI, oral membranes moist Neck: supple Cardiovascular: RRR without murmur. No JVD    Respiratory/Chest: CTA Bilaterally without wheezes or rales. Normal effort    GI/Abdomen: BS +, non-tender, non-distended Ext: no clubbing, cyanosis, or edema Psych: pleasant and cooperative  Skin: R heel wound, scattered abrasions proximally.  Left leg: 3/14     Neuro:    Alert and  awake, CN 2-12 grossly intact, follows commands    Comments: Decreased to light touch from R mid shin downwards;  Musculoskeletal:  UE strength 5/5 in Biceps, triceps, WE, grip and FA B/L. Strength symmetrical and unchanged in BUE. Subjective sensory loss LUE? RLE- 5/5 in HF, KE, KF- but moderate foot drop- DF 1/5 and PF 1/5 LLE- 4/5 HF, KE--no change     Assessment/Plan: 1. Functional deficits which require 3+ hours per day of interdisciplinary therapy in a comprehensive inpatient rehab setting. Physiatrist is providing close team supervision and 24 hour management of active medical problems listed below. Physiatrist and rehab team continue to assess barriers to discharge/monitor patient progress toward functional and medical goals  Care Tool:  Bathing    Body parts bathed by patient: Right arm, Left arm, Abdomen, Chest, Front perineal area, Right upper leg, Left upper leg, Face, Buttocks, Right lower leg   Body parts bathed by helper: Buttocks, Right lower leg Body parts n/a: Left lower leg   Bathing assist Assist Level: Contact Guard/Touching assist (shower level seated)     Upper Body Dressing/Undressing Upper body dressing   What is the patient wearing?: Pull over shirt  Upper body assist Assist Level: Set up assist    Lower Body Dressing/Undressing Lower body dressing      What is the patient wearing?: Pants, Incontinence brief     Lower body assist Assist for lower body dressing: Moderate Assistance - Patient 50 - 74%     Toileting Toileting    Toileting assist Assist for toileting: Moderate Assistance - Patient 50 - 74%     Transfers Chair/bed transfer  Transfers assist     Chair/bed transfer assist level: Supervision/Verbal cueing     Locomotion Ambulation   Ambulation assist   Ambulation activity did not occur: Safety/medical concerns (Unable to ambulated at this time secondary to new BKA with global deconditioning)          Walk 10  feet activity   Assist  Walk 10 feet activity did not occur: Safety/medical concerns        Walk 50 feet activity   Assist Walk 50 feet with 2 turns activity did not occur: Safety/medical concerns         Walk 150 feet activity   Assist Walk 150 feet activity did not occur: Safety/medical concerns         Walk 10 feet on uneven surface  activity   Assist Walk 10 feet on uneven surfaces activity did not occur: Safety/medical concerns         Wheelchair     Assist Is the patient using a wheelchair?: Yes Type of Wheelchair: Manual    Wheelchair assist level: Supervision/Verbal cueing Max wheelchair distance: 150    Wheelchair 50 feet with 2 turns activity    Assist        Assist Level: Supervision/Verbal cueing   Wheelchair 150 feet activity     Assist      Assist Level: Supervision/Verbal cueing   Blood pressure (!) 144/86, pulse 84, temperature (!) 97.3 F (36.3 C), temperature source Oral, resp. rate 16, height 6' (1.829 m), weight 102.6 kg, SpO2 95 %.  Medical Problem List and Plan: 1. 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  -Continue CIR therapies including PT, OT            2.  Antithrombotics: -DVT/anticoagulation:  Pharmaceutical: Heparin             -antiplatelet therapy: NA 3. Pain Management: Oxycodone prn for pain. No phantom pain so far  -muscle rub for R thigh started- pain improved  3/12 gabapentin 200mg  qhs helpful for LUE, no SE  3/13 pain controlled 4. Mood/Behavior/Sleep: LCSW to follow for evaluation and support.              -antipsychotic agents: N/A  -3/8 Melatonin 3mg  HS started 5. Neuropsych/cognition: This patient is capable of making decisions on his own behalf. 6. Skin/Wound Care: Routine pressure relief measures.   -ordered smaller  2X shrinkers per [employer] which pt is now wearing  -staples out- wear shrinker directly over leg without dressing. 7. Fluids/Electrolytes/Nutrition: Strict I/O. Daily weights. Enforce 1200 cc FR.             --Reports on multiple supplements--d/c nephro.  --Continue Juven bid, Zinc and Vitamin-C to promote wound healing.  -Hyponatremia- improved on labs 3/4 to 131  8. Left calf staph positive cultures: IV antibiotics changed to Augmentin X 19 days per recs.  (stop 07/03/22) 9. HTN: Monitor BP TID--continue Avapro (reduced to 75mg  2/27) and Demadex -STTS.   -3/7 irbesartan was previously decreased to allow for more volume to be reduced by nephrology, continue to monitor, BP mildy elevated but stable overall, hesitant to increase BP medications due to dialysis, if BP increases further may need increase irbesartan, Add hydralazine PRN SBP > 180  -3/15 BP with fair to borderlinecontrol, continue current regimen, parameters placed for PT/OT     07/02/2022    5:00 AM 07/01/2022    8:30 PM 07/01/2022    1:13 PM  Vitals with BMI  Weight 226 lbs 3 oz    BMI [id number]    Systolic [id number]  Diastolic 86 84 78  Pulse 84 90 74    10. T2DM: Hgb A1C- 7.4. Was on Glimepiride and Rubelsus PTA.  -- Monitor BS ac/hs and use SSI for tighter control.         CBG (last 3)  Recent Labs    07/01/22 1703 07/01/22 2121 07/02/22 0628  GLUCAP 154* 152* 138*   3/12 increased am semglee to 12u  3/14-15 cbg's leveling out. Continue semglee 12u qam and 4u qpm   -continue scheduled novolog 4u tid ac 11. ESRD: Volume being managed with HD MWF. Is basically anuric- voided x2 since admission. Via fistula --  HD at the end of the day to help with tolerance of therapy.  12.  Leucocytosis due to LLE osteomyelitis- : Monitor for fevers and other signs of infection.              --augmentin complete 3/16   -3/13 WBC down to 7.3, improved 13. Hyponatremia: Being managed with HD.  14. Cough: resolved  15. Right hip pain:  Fall 3 weeks PTA. Will order Voltaren gel TID.  16. Loose stools- due to IV ABX per pt- might need a stool bulker/fiber-   -3/1 Fibercon started 625mg  BID  -3/13 med type 5 stool   LOS: 17 days A FACE TO FACE EVALUATION WAS PERFORMED  Meredith Staggers 07/02/2022, 10:15 AM

## 2022-07-02 NOTE — Progress Notes (Signed)
Physical Therapy Session Note  Patient Details  Name: Scott Castillo MRN: [id number] Date of Birth: 11-28-1960  Today's Date: 07/02/2022 PT Individual Time: 0805-0900 PT Individual Time Calculation (min): 55 min   Short Term Goals: Week 2:  PT Short Term Goal 1 (Week 2): Patient will perform bed<>chair transfer at [facility] level with slide board. PT Short Term Goal 1 - Progress (Week 2): Met PT Short Term Goal 2 (Week 2): Patient will manage WC brakes, leg rests, armrests with supervision/cues only for safe propulsion and WC transfers. PT Short Term Goal 2 - Progress (Week 2): Progressing toward goal PT Short Term Goal 3 (Week 2): Pt will self propel WC 100' with bil UE and no seated rest break with supervision only. PT Short Term Goal 3 - Progress (Week 2): Met Week 3:     Skilled Therapeutic Interventions/Progress Updates: Pt presented in w/c agreeable to therapy. Pt denies pain and states feels better after staples removed. Pt propelled to main gym with supervision and without any rest breaks! Pt was able to remove leg rest and amputee pad with supervision and increased time. Pt was able to align w/c, apply brakes, and perform lateral scoot transfer to mat with supervision. Pt transferred to supine and performed the following exercises: SLR AROM LLE/RLE 7# 2 x 10, modified bridges from bolster 2 x 10, sidelying hip abd 2 x 10, sidelying hip extension with green theraband 2 x 10, PTA performed sidelying ITB and hip flexor stretch 1 min x 3. Pt transferred to sitting and performed LAQ with RLE #5 x 10. Pt was able to complete squat pivot to return to w/c and leg rests were applied by PTA for time management. Pt propelled back to room mod I without rest breaks. Pt remained in w/c at end of session and left with call bell within reach and needs met.      Therapy Documentation Precautions:  Precautions Precautions: Fall Precaution Comments: L BKA Other Brace: L limb protector Restrictions Weight  Bearing Restrictions: No LLE Weight Bearing: Partial weight bearing General:   Vital Signs:  Pain:      Therapy/Group: Individual Therapy  Pryce Folts 07/02/2022, 11:27 AM

## 2022-07-02 NOTE — Progress Notes (Addendum)
[facility] Progress Note   Subjective: Seen in room. BS 70. Concerned BS may continue to fall. Notified RN. Currently asymptomatic but says he is hungry.  HD later today.     Objective Vitals:   07/01/22 1301 07/01/22 1313 07/01/22 2030 07/02/22 0500  BP: 118/66 122/78 136/84 (!) 144/86  Pulse: 71 74 90 84  Resp: 17 17 15 16   Temp: 98 F (36.7 C) 97.8 F (36.6 C) (!) 97.3 F (36.3 C) (!) 97.3 F (36.3 C)  TempSrc: Oral Oral  Oral  SpO2: 96% 96% 97% 95%  Weight:    102.6 kg  Height:       Physical Exam General: chronically ill appearing male in NAD Heart: S1,S2 No M/R/G Lungs: CTAB. No WOB.  Abdomen: NT, NABS Extremities: L BKA. chronic stasis changes RLE with trace edema Dialysis Access: L AVF +T/B    Additional Objective Labs: Basic Metabolic Panel: Recent Labs  Lab 06/25/22 1209 06/28/22 0610 06/30/22 0611  NA 130* 132* 132*  K 5.1 4.9 4.9  CL 92* 93* 93*  CO2 25 25 24   GLUCOSE 98 137* 149*  BUN 73* 77* 79*  CREATININE 7.81* 8.46* 8.38*  CALCIUM 9.2 9.3 9.4  PHOS 5.2* 5.3* 5.2*   Liver Function Tests: Recent Labs  Lab 06/25/22 1209 06/28/22 0610 06/30/22 0611  ALBUMIN 2.2* 2.2* 2.3*   No results for input(s): "LIPASE", "AMYLASE" in the last 168 hours. CBC: Recent Labs  Lab 06/25/22 1209 06/28/22 0610 06/30/22 0611 07/02/22 1036  WBC 11.5* 8.8 7.3 8.6  NEUTROABS  --  7.3  --   --   HGB 8.5* 8.0* 8.4* 8.7*  HCT 26.9* 26.9* 26.8* 29.3*  MCV 88.5 90.9 89.0 91.8  PLT 225 204 199 230   Blood Culture    Component Value Date/Time   SDES TISSUE 06/11/2022 1449   SPECREQUEST LEFT BKA 06/11/2022 1449   CULT  06/11/2022 1449    RARE STAPHYLOCOCCUS AUREUS NO ANAEROBES ISOLATED Performed at [facility] [address], [place], [place] [address]    REPTSTATUS 06/17/2022 FINAL 06/11/2022 1449    Cardiac Enzymes: No results for input(s): "CKTOTAL", "CKMB", "CKMBINDEX", "TROPONINI" in the last 168 hours. CBG: Recent  Labs  Lab 07/01/22 0626 07/01/22 1211 07/01/22 1703 07/01/22 2121 07/02/22 0628  GLUCAP 150* 75 154* 152* 138*   Iron Studies: No results for input(s): "IRON", "TIBC", "TRANSFERRIN", "FERRITIN" in the last 72 hours. @lablastinr3 @ Studies/Results: No results found. Medications:   (feeding supplement) PROSource Plus  30 mL Oral BID BM   amoxicillin-clavulanate  1 tablet Oral q1800   vitamin C  1,000 mg Oral Daily   atorvastatin  40 mg Oral Daily   calcitRIOL  1.25 mcg Oral Q M,W,F-HD   carvedilol  25 mg Oral BID WC   Chlorhexidine Gluconate Cloth  6 each Topical Q0600   Chlorhexidine Gluconate Cloth  6 each Topical Q0600   Chlorhexidine Gluconate Cloth  6 each Topical Q0600   darbepoetin (ARANESP) injection - DIALYSIS  200 mcg Subcutaneous Q Tue-1800   diclofenac Sodium  2 g Topical TID AC   gabapentin  200 mg Oral QHS   Gerhardt's butt cream   Topical BID   guaiFENesin  600 mg Oral BID   heparin  5,000 Units Subcutaneous Q8H   insulin aspart  0-5 Units Subcutaneous QHS   insulin aspart  0-9 Units Subcutaneous TID with meals   insulin aspart  4 Units Subcutaneous TID WC   insulin glargine-yfgn  12 Units Subcutaneous Daily   And   insulin glargine-yfgn  4 Units Subcutaneous Daily   irbesartan  75 mg Oral Daily   lactobacillus  1 g Oral TID WC   melatonin  3 mg Oral QHS   multivitamin  1 tablet Oral QHS   pantoprazole  40 mg Oral Daily   polycarbophil  625 mg Oral BID   sucroferric oxyhydroxide  1,000 mg Oral TID WC   torsemide  100 mg Oral Once per day on Sun Tue Thu Sat     Dialysis Orders: [facility] MWF 4h 20min  400/800   108.5kg   2/2 bath  LFA AVF   - No Heparin  - rocaltrol 1.25 mcg po tiw - mircera 225 mcg IV  q 2wks, last 2/12, due 2/26 - last Hb 8.6 on 2/14   Assessment/Plan: Leukocytosis/L foot 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 On Augmentin.  Hyponatremia - worsening in the setting of hypervolemia. managing with HD/UF. Fluid restriction reinforced.  Resolved, Na+ 132.  ESRD - on HD MWF. HD 07/02/2022 per regular schedule.  K improved with renal diet.  HTN/ volume - Getting under edw, continues to have edema, UF as tolerated.  EDW should be lower on d/c post BKA, last post wt 101.5kg. BP at goal today. Recently lowered irbesartan 75 mg PO daily started on 06/16/22 to facilitate volume removal.   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 s/p 1 unit prbcs 2/22. On Aranesp 259mcg qTues starting 06/22/22. Check iron panel when he completes ABX.  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 Cont po vdra and velphoro as binder.  DM2 - Chronic hyperglycemia, poorly controlled DMT2. PMD adjusting meds.  Nutrition - hypoalbuminemia- Continue protein supplement. Fluid restriction  Jimmye Norman. Brown NP-C 07/02/2022, 11:45 AM  [employer] [phone]   Seen and examined independently.  Agree with note and exam as documented above by physician extender and as noted here.  His wife is at bedside.  Dressed in green   General adult male in bed in no acute distress HEENT normocephalic atraumatic extraocular movements intact sclera anicteric Neck supple trachea midline Lungs clear to auscultation bilaterally normal work of breathing at rest  Heart S1S2 no rub  Abdomen soft nontender distended/obese habitus  Extremities right leg with 1-2+ edema; left BKA Psych normal mood and affect Neuro alert and oriented provides a hx and follows commands Access LUE AVF with bruit and thrill   ESRD - on HD per MWF schedule.  He will get HD later today   Left foot abscess - abx per primary team. S/p left BKA   HTN - optimize volume with HD; note that we will need to lower his EDW s/p amputation. S/p reduction in irbesartan per charting   Anemia CKD - on aranesp 200 mcg weekly. Coming up slowly   Disposition - per primary team   Claudia Desanctis, MD 07/02/2022 1:29 PM

## 2022-07-02 NOTE — Progress Notes (Signed)
POST HD TX NOTE  07/02/22 1834  Vitals  Temp (!) 97.5 F (36.4 C)  Temp Source Tympanic  BP 131/83  MAP (mmHg) 96  BP Location Left Arm  BP Method Automatic  Patient Position (if appropriate) Lying  Pulse Rate 83  Pulse Rate Source Monitor  ECG Heart Rate 83  Resp 15  Oxygen Therapy  SpO2 97 %  O2 Device Room Air  Pulse Oximetry Type Continuous  During Treatment Monitoring  Intra-Hemodialysis Comments (S)   (post HD tx VS check)  Post Treatment  Dialyzer Clearance Lightly streaked  Duration of HD Treatment -hour(s) 4 hour(s)  Hemodialysis Intake (mL) 0 mL  Liters Processed 96  Fluid Removed (mL) 4000 mL  Tolerated HD Treatment Yes  Post-Hemodialysis Comments (S)  tx completed w/o problem, UF goal met, blood rinsed back VSS. Medication Admin: none  Fistula / Graft Left Forearm Arteriovenous fistula  No placement date or time found.   Orientation: Left  Access Location: Forearm  Access Type: Arteriovenous fistula  Site Condition No complications  Fistula / Graft Assessment Bruit;Thrill;Present  Drainage Description None

## 2022-07-02 NOTE — Progress Notes (Signed)
**Note Scott-Identified via Obfuscation** Physical Therapy Session Note  Patient Details  Name: DEMTRIUS Castillo MRN: [id number] Date of Birth: 12-Dec-1960  Today's Date: 07/02/2022 PT Individual Time: 1102-1200 PT Individual Time Calculation (min): 58 min   Short Term Goals: Week 2:  PT Short Term Goal 1 (Week 2): Patient will perform bed<>chair transfer at [facility] level with slide board. PT Short Term Goal 1 - Progress (Week 2): Met PT Short Term Goal 2 (Week 2): Patient will manage WC brakes, leg rests, armrests with supervision/cues only for safe propulsion and WC transfers. PT Short Term Goal 2 - Progress (Week 2): Progressing toward goal PT Short Term Goal 3 (Week 2): Pt will self propel WC 100' with bil UE and no seated rest break with supervision only. PT Short Term Goal 3 - Progress (Week 2): Met  Skilled Therapeutic Interventions/Progress Updates: Patient welcomed in [facility] and agreeable to participate in PT session with no complaints of pain. Patient self propelled to day room gym and required vc for energy conservation and safety as he became fatigued from propelling too fast. Today's session focused on increasing physical upper and lower body strength and endurance in order to prepare patient for ambulating with RW and self managing WC independently. Hand rails required during kinetron intervention in order to prevent WC from being pushed away from machine. Patient has progressed in [facility] management by demonstrating less than min A to remove/place leg rests. Lateral transfer WC<>hi/low mat performed with therapist only stabilizing WC for safety. Patient required multimodal cues during interventions for proper body mechanics and sequence. R LE object clearance of various heights required multiple attempts in order to find proper challenging height. Patient required mod A during STS x1 from mat<>RW, which was deferred due to patient struggling to perform anterior weight shift. Anterior weight shift from hi/low mat to WC using B UE in order to  simulate initial movement of STS deferred today as well due to patient safety. Patient self propelled to double fire doors, and then therapist transferred patient back to room afterwards. Patient left in [facility] inside room with all necessities in reach.      Therapy Documentation Precautions:  Precautions Precautions: Fall Precaution Comments: L BKA Other Brace: L limb protector Restrictions Weight Bearing Restrictions: No LLE Weight Bearing: Partial weight bearing   Exercises:  Kinetron with patient sitting in WC and using R LE while therapist alternates foot plate 2 x 40min Tricep extension with yoga blocks on hi/low mat 2 x 10  Sitting on edge of mat and clearing objects of various heights on the floor with R LE in Hip flexion->knee extension->knee flexion->rest Sitting on edge of mat and patient forward flexes to reach for pilates ball and pulls back into upright position with ball OH    Therapy/Group: Individual Therapy  Myia Bergh, PTA 07/02/2022, 12:11 PM

## 2022-07-02 NOTE — Procedures (Signed)
Seen and examined on dialysis.  Procedure supervised.  Blood pressure 136/82 and HR 80.  Tolerating goal.  LUE AVF in use.   Claudia Desanctis, MD 07/02/2022  5:20 PM

## 2022-07-02 NOTE — Progress Notes (Signed)
Occupational Therapy Session Note  Patient Details  Name: Scott Castillo MRN: [id number] Date of Birth: 07/12/1960  Today's Date: 07/02/2022 OT Individual Time: 0935-1030 OT Individual Time Calculation (min): 55 min    Short Term Goals: Week 3:  OT Short Term Goal 1 (Week 3): STG= LTG d/t SNF d/c  Skilled Therapeutic Interventions/Progress Updates:    Pt received sitting in the w/c with no c/o pain. 2XL shrinker in room- encouraged pt to change it and he was able to do so with set up assist. Desensitization strategies reviewed and he was able to complete and reported the limb was already much less sensitive. He propelled the w/c to the therapy gym with no rest breaks, (S). Improved endurance. He completed sit > stand from the w/c using the RW with mod A. He required heavy mod A to attempt and pivot toward the mat, lifting his heel and achieving one hop. He returned to the w/c and required an extended rest break before attempting again. 2nd trial with similar assist. 3rd trial he was able to pivot to the mat with mod A. Worked on heel lift in standing for carryover to stand pivot. 3 repetitions of about 1 minute standing with min A statically, mod A to power up. He completed a squat pivot transfer back to his w/c with CGA. He propelled back to his room and was left sitting up with all needs met.   Therapy Documentation Precautions:  Precautions Precautions: Fall Precaution Comments: L BKA Other Brace: L limb protector Restrictions Weight Bearing Restrictions: No LLE Weight Bearing: Partial weight bearing  Therapy/Group: Individual Therapy  Curtis Sites 07/02/2022, 9:55 AM

## 2022-07-03 LAB — GLUCOSE, CAPILLARY
Glucose-Capillary: 102 mg/dL — ABNORMAL HIGH (ref 70–99)
Glucose-Capillary: 103 mg/dL — ABNORMAL HIGH (ref 70–99)
Glucose-Capillary: 163 mg/dL — ABNORMAL HIGH (ref 70–99)
Glucose-Capillary: 164 mg/dL — ABNORMAL HIGH (ref 70–99)

## 2022-07-03 NOTE — Progress Notes (Signed)
PROGRESS NOTE   Subjective/Complaints: No acute complaints.  No events overnight.  ROS: Patient denies fever, rash, sore throat, blurred vision, dizziness, nausea, vomiting, diarrhea, cough, shortness of breath or chest pain,   headache, or mood change.   Objective:   No results found.    Recent Labs    07/02/22 1036  WBC 8.6  HGB 8.7*  HCT 29.3*  PLT 230    Recent Labs    07/02/22 1036  NA 131*  K 4.7  CL 94*  CO2 23  GLUCOSE 84  BUN 71*  CREATININE 7.94*  CALCIUM 9.5     Intake/Output Summary (Last 24 hours) at 07/03/2022 2011 Last data filed at 07/03/2022 0900 Gross per 24 hour  Intake 473 ml  Output --  Net 473 ml      Pressure Injury 06/27/22 Heel Right Deep Tissue Pressure Injury - Purple or maroon localized area of discolored intact skin or blood-filled blister due to damage of underlying soft tissue from pressure and/or shear. (Active)  06/27/22 2125  Location: Heel  Location Orientation: Right  Staging: Deep Tissue Pressure Injury - Purple or maroon localized area of discolored intact skin or blood-filled blister due to damage of underlying soft tissue from pressure and/or shear.  Wound Description (Comments):   Present on Admission: No    Physical Exam: Vital Signs Blood pressure 127/76, pulse 73, temperature 98 F (36.7 C), temperature source Oral, resp. rate 18, height 6' (1.829 m), weight 100.9 kg, SpO2 96 %.   Constitutional: No distress . Vital signs reviewed. HEENT: NCAT, EOMI, oral membranes moist Neck: supple Cardiovascular: RRR without murmur. No JVD    Respiratory/Chest: CTA Bilaterally without wheezes or rales. Normal effort    GI/Abdomen: BS +, non-tender, non-distended Ext: no clubbing, cyanosis, or edema Psych: pleasant and cooperative  Skin: R heel wound, scattered abrasions proximally.  Left leg: 3/14     Neuro:    Alert and awake, CN 2-12 grossly intact, follows  commands    Comments: Decreased to light touch from R mid shin downwards;  Musculoskeletal:  3-16: Moving all 4 limbs antigravity and against resistance.  UE strength 5/5 in Biceps, triceps, WE, grip and FA B/L. Strength symmetrical and unchanged in BUE. Subjective sensory loss LUE? RLE- 5/5 in HF, KE, KF- but moderate foot drop- DF 1/5 and PF 1/5 LLE- 4/5 HF, KE--no change     Assessment/Plan: 1. Functional deficits which require 3+ hours per day of interdisciplinary therapy in a comprehensive inpatient rehab setting. Physiatrist is providing close team supervision and 24 hour management of active medical problems listed below. Physiatrist and rehab team continue to assess barriers to discharge/monitor patient progress toward functional and medical goals  Care Tool:  Bathing    Body parts bathed by patient: Right arm, Left arm, Abdomen, Chest, Front perineal area, Right upper leg, Left upper leg, Face, Buttocks, Right lower leg   Body parts bathed by helper: Buttocks, Right lower leg Body parts n/a: Left lower leg   Bathing assist Assist Level: Contact Guard/Touching assist (shower level seated)     Upper Body Dressing/Undressing Upper body dressing   What is the patient wearing?: Pull over  shirt    Upper body assist Assist Level: Set up assist    Lower Body Dressing/Undressing Lower body dressing      What is the patient wearing?: Pants, Incontinence brief     Lower body assist Assist for lower body dressing: Moderate Assistance - Patient 50 - 74%     Toileting Toileting    Toileting assist Assist for toileting: Moderate Assistance - Patient 50 - 74%     Transfers Chair/bed transfer  Transfers assist     Chair/bed transfer assist level: Supervision/Verbal cueing     Locomotion Ambulation   Ambulation assist   Ambulation activity did not occur: Safety/medical concerns (Unable to ambulated at this time secondary to new BKA with global  deconditioning)          Walk 10 feet activity   Assist  Walk 10 feet activity did not occur: Safety/medical concerns        Walk 50 feet activity   Assist Walk 50 feet with 2 turns activity did not occur: Safety/medical concerns         Walk 150 feet activity   Assist Walk 150 feet activity did not occur: Safety/medical concerns         Walk 10 feet on uneven surface  activity   Assist Walk 10 feet on uneven surfaces activity did not occur: Safety/medical concerns         Wheelchair     Assist Is the patient using a wheelchair?: Yes Type of Wheelchair: Manual    Wheelchair assist level: Supervision/Verbal cueing Max wheelchair distance: 150    Wheelchair 50 feet with 2 turns activity    Assist        Assist Level: Supervision/Verbal cueing   Wheelchair 150 feet activity     Assist      Assist Level: Supervision/Verbal cueing   Blood pressure 127/76, pulse 73, temperature 98 F (36.7 C), temperature source Oral, resp. rate 18, height 6' (1.829 m), weight 100.9 kg, SpO2 96 %.  Medical Problem List and Plan: 1. 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  -Continue CIR therapies including PT, OT            2.  Antithrombotics: -DVT/anticoagulation:  Pharmaceutical: Heparin             -antiplatelet therapy: NA 3. Pain Management: Oxycodone prn for pain. No phantom pain so far  -muscle rub for R thigh started- pain improved  3/12 gabapentin 200mg  qhs helpful for LUE, no SE  3/13 pain controlled 4. Mood/Behavior/Sleep: LCSW to follow for evaluation and support.              -antipsychotic agents: N/A  -3/8 Melatonin 3mg  HS started 5. Neuropsych/cognition: This patient is capable of making decisions on his own behalf. 6. Skin/Wound Care: Routine pressure relief  measures.   -ordered smaller 2X shrinkers per [employer] which pt is now wearing  -staples out- wear shrinker directly over leg without dressing. 7. Fluids/Electrolytes/Nutrition: Strict I/O. Daily weights. Enforce 1200 cc FR.             --Reports on multiple supplements--d/c nephro.  --Continue Juven bid, Zinc and Vitamin-C to promote wound healing.  -Hyponatremia- improved on labs 3/4  to 131 8. Left calf staph positive cultures: IV antibiotics changed to Augmentin X 19 days per recs.  (stop 07/03/22) 9. HTN: Monitor BP TID--continue Avapro (reduced to 75mg  2/27) and Demadex -STTS.   -3/7 irbesartan was previously decreased to allow for more volume to be reduced by nephrology, continue to monitor, BP mildy elevated but stable overall, hesitant to increase BP medications due to dialysis, if BP increases further may need increase irbesartan, Add hydralazine PRN SBP > 180  -3/15 BP with fair to borderlinecontrol, continue current regimen, parameters placed for PT/OT - 3/16: Normotensive, monitor     07/03/2022    7:17 PM 07/03/2022    2:56 PM 07/03/2022    5:04 AM  Vitals with BMI  Weight   222 lbs 7 oz  BMI   0000000  Systolic [id number]   Diastolic 76 77   Pulse 73 74     10. T2DM: Hgb A1C- 7.4. Was on Glimepiride and Rubelsus PTA.  -- Monitor BS ac/hs and use SSI for tighter control.         CBG (last 3)  Recent Labs    07/03/22 0601 07/03/22 1147 07/03/22 1707  GLUCAP 102* 103* 163*    3/12 increased am semglee to 12u  3/14-15 cbg's leveling out. Continue semglee 12u qam and 4u qpm   -continue scheduled novolog 4u tid ac  3/16: Blood sugars well-controlled on current regimen: Monitor  11. ESRD: Volume being managed with HD MWF. Is basically anuric- voided x2 since admission. Via fistula --  HD at the end of the day to help with tolerance of therapy.  12.  Leucocytosis due to LLE osteomyelitis- : Monitor for fevers and other signs of infection.              --augmentin complete 3/16    -3/13 WBC down to 7.3, improved 13. Hyponatremia: Being managed with HD.  14. Cough: resolved  15. Right hip pain: Fall 3 weeks PTA. Will order Voltaren gel TID.  16. Loose stools- due to IV ABX per pt- might need a stool bulker/fiber-   -3/1 Fibercon started 625mg  BID  -3/13 med type 5 stool   LOS: 18 days A FACE TO [place] 07/03/2022, 8:11 PM

## 2022-07-03 NOTE — Progress Notes (Addendum)
[facility] Progress Note   Subjective: Up in chair in wheelchair. Net UF 4 liters with HD yesterday. He requested challenge during HD anticipating W/E gains. Reminded to watch fluids. BS 109 this AM.   Objective Vitals:   07/02/22 1901 07/02/22 1919 07/03/22 0347 07/03/22 0504  BP: 137/84 (!) 143/78 103/72   Pulse: 84 87 79   Resp: 18 18 18    Temp: (!) 97.5 F (36.4 C) (!) 97.4 F (36.3 C) 98 F (36.7 C)   TempSrc: Oral Oral Oral   SpO2: 96% 98% 90%   Weight:    100.9 kg  Height:       Physical Exam General: chronically ill appearing male in NAD Heart: S1,S2 No M/R/G Lungs: CTAB. No WOB.  Abdomen: NT, NABS Extremities: L BKA. chronic stasis changes RLE with trace edema Dialysis Access: L AVF +T/B    Additional Objective Labs: Basic Metabolic Panel: Recent Labs  Lab 06/28/22 0610 06/30/22 0611 07/02/22 1036  NA 132* 132* 131*  K 4.9 4.9 4.7  CL 93* 93* 94*  CO2 25 24 23   GLUCOSE 137* 149* 84  BUN 77* 79* 71*  CREATININE 8.46* 8.38* 7.94*  CALCIUM 9.3 9.4 9.5  PHOS 5.3* 5.2* 4.8*   Liver Function Tests: Recent Labs  Lab 06/28/22 0610 06/30/22 0611 07/02/22 1036  ALBUMIN 2.2* 2.3* 2.6*   No results for input(s): "LIPASE", "AMYLASE" in the last 168 hours. CBC: Recent Labs  Lab 06/28/22 0610 06/30/22 0611 07/02/22 1036  WBC 8.8 7.3 8.6  NEUTROABS 7.3  --   --   HGB 8.0* 8.4* 8.7*  HCT 26.9* 26.8* 29.3*  MCV 90.9 89.0 91.8  PLT 204 199 230   Blood Culture    Component Value Date/Time   SDES TISSUE 06/11/2022 1449   SPECREQUEST LEFT BKA 06/11/2022 1449   CULT  06/11/2022 1449    RARE STAPHYLOCOCCUS AUREUS NO ANAEROBES ISOLATED Performed at [facility], [place] [address] [place], [place] [address]    REPTSTATUS 06/17/2022 FINAL 06/11/2022 1449    Cardiac Enzymes: No results for input(s): "CKTOTAL", "CKMB", "CKMBINDEX", "TROPONINI" in the last 168 hours. CBG: Recent Labs  Lab 07/02/22 0628 07/02/22 1203  07/02/22 1857 07/02/22 2115 07/03/22 0601  GLUCAP 138* 70 210* 230* 102*   Iron Studies: No results for input(s): "IRON", "TIBC", "TRANSFERRIN", "FERRITIN" in the last 72 hours. @lablastinr3 @ Studies/Results: No results found. Medications:   (feeding supplement) PROSource Plus  30 mL Oral BID BM   amoxicillin-clavulanate  1 tablet Oral q1800   vitamin C  1,000 mg Oral Daily   atorvastatin  40 mg Oral Daily   calcitRIOL  1.25 mcg Oral Q M,W,F-HD   carvedilol  25 mg Oral BID WC   Chlorhexidine Gluconate Cloth  6 each Topical Q0600   Chlorhexidine Gluconate Cloth  6 each Topical Q0600   Chlorhexidine Gluconate Cloth  6 each Topical Q0600   darbepoetin (ARANESP) injection - DIALYSIS  200 mcg Subcutaneous Q Tue-1800   diclofenac Sodium  2 g Topical TID AC   gabapentin  200 mg Oral QHS   Gerhardt's butt cream   Topical BID   guaiFENesin  600 mg Oral BID   heparin  5,000 Units Subcutaneous Q8H   insulin aspart  0-5 Units Subcutaneous QHS   insulin aspart  0-9 Units Subcutaneous TID with meals   insulin aspart  4 Units Subcutaneous TID WC   insulin glargine-yfgn  12 Units Subcutaneous Daily   And   insulin  glargine-yfgn  4 Units Subcutaneous Daily   irbesartan  75 mg Oral Daily   lactobacillus  1 g Oral TID WC   melatonin  3 mg Oral QHS   multivitamin  1 tablet Oral QHS   pantoprazole  40 mg Oral Daily   polycarbophil  625 mg Oral BID   sucroferric oxyhydroxide  1,000 mg Oral TID WC   torsemide  100 mg Oral Once per day on Sun Tue Thu Sat     Dialysis Orders: [facility] MWF 4h 27min  400/800   108.5kg   2/2 bath  LFA AVF   - No Heparin  - rocaltrol 1.25 mcg po tiw - mircera 225 mcg IV  q 2wks, last 2/12, due 2/26 - last Hb 8.6 on 2/14   Assessment/Plan: Leukocytosis/L foot 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 On Augmentin.  Hyponatremia - worsening in the setting of hypervolemia. managing with HD/UF. Fluid restriction reinforced. Resolved, Na+ 132.  ESRD - on HD MWF. HD  07/05/2022 per regular schedule.  HTN/ volume - Getting under edw, continues to have edema, UF as tolerated.  EDW should be lower on d/c post BKA, last post wt 101.5kg. BP at goal today. Recently lowered irbesartan 75 mg PO daily started on 06/16/22 to facilitate volume removal. Weight appears more believable now. Continue to UF as tolerated, optimize volume with HD.  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 On Aranesp 266mcg qTues starting 06/22/22. Check iron panel when he completes ABX.  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 Cont po vdra and velphoro as binder.  DM2 - Chronic hyperglycemia, poorly controlled DMT2. PMD adjusting meds.  Nutrition - hypoalbuminemia- Continue protein supplement. Fluid restriction  Jimmye Norman. Brown NP-C 07/03/2022, 10:24 AM  [facility] [phone]     Seen and examined independently.  Agree with note and exam as documented above by physician extender and as noted here.  General adult male in bed in no acute distress HEENT normocephalic atraumatic extraocular movements intact sclera anicteric Neck supple trachea midline Lungs clear to auscultation bilaterally normal work of breathing at rest  Heart S1S2 no rub  Abdomen soft nontender distended/obese habitus  Extremities right leg without edema; left BKA Psych normal mood and affect Neuro alert and oriented provides a hx and follows commands Access LUE AVF with bruit and thrill   ESRD - HD per MWF schedule.  Note that we will need to lower his EDW s/p amputation.  Note 100.9 kg is post weight from 3/15 however 103/72 BP the morning afterward.  Wouldn't challenge from this point and 100.9 kg might be too low.    Left foot abscess - abx per primary team.  S/p left BKA   HTN - optimize volume with HD.  S/p reduction in irbesartan per charting   Anemia CKD - on aranesp 200 mcg weekly. Coming up slowly   Disposition - per primary team   Claudia Desanctis, MD 07/03/2022 2:11 PM

## 2022-07-03 NOTE — Plan of Care (Signed)
  Problem: Consults Goal: RH LIMB LOSS PATIENT EDUCATION Description: Description: See Patient Education module for eduction specifics. Outcome: Progressing   Problem: RH BOWEL ELIMINATION Goal: RH STG MANAGE BOWEL WITH ASSISTANCE Description: STG Manage Bowel with mod I Assistance. Outcome: Progressing Goal: RH STG MANAGE BOWEL W/MEDICATION W/ASSISTANCE Description: STG Manage Bowel with Medication with mod I Assistance. Outcome: Progressing   Problem: RH SKIN INTEGRITY Goal: RH STG SKIN FREE OF INFECTION/BREAKDOWN Description: Manage skin/incisions w min assist Outcome: Progressing Goal: RH STG ABLE TO PERFORM INCISION/WOUND CARE W/ASSISTANCE Description: STG Able To Perform Incision/Wound Care With min Assistance. Outcome: Progressing   Problem: RH SAFETY Goal: RH STG ADHERE TO SAFETY PRECAUTIONS W/ASSISTANCE/DEVICE Description: STG Adhere to Safety Precautions With cues Assistance/Device. Outcome: Progressing   Problem: RH PAIN MANAGEMENT Goal: RH STG PAIN MANAGED AT OR BELOW PT'S PAIN GOAL Description: < 4 with prns Outcome: Progressing   Problem: RH KNOWLEDGE DEFICIT LIMB LOSS Goal: RH STG INCREASE KNOWLEDGE OF SELF CARE AFTER LIMB LOSS Description: Patient and spouse will be able to manage care at discharge using educational resources independently Outcome: Progressing   Problem: Education: Goal: Ability to describe self-care measures that may prevent or decrease complications (Diabetes Survival Skills Education) will improve Outcome: Progressing Goal: Individualized Educational Video(s) Outcome: Progressing   Problem: Coping: Goal: Ability to adjust to condition or change in health will improve Outcome: Progressing   Problem: Fluid Volume: Goal: Ability to maintain a balanced intake and output will improve Outcome: Progressing   Problem: Health Behavior/Discharge Planning: Goal: Ability to identify and utilize available resources and services will  improve Outcome: Progressing Goal: Ability to manage health-related needs will improve Outcome: Progressing   Problem: Metabolic: Goal: Ability to maintain appropriate glucose levels will improve Outcome: Progressing   Problem: Nutritional: Goal: Maintenance of adequate nutrition will improve Outcome: Progressing Goal: Progress toward achieving an optimal weight will improve Outcome: Progressing   Problem: Skin Integrity: Goal: Risk for impaired skin integrity will decrease Outcome: Progressing   Problem: Tissue Perfusion: Goal: Adequacy of tissue perfusion will improve Outcome: Progressing   Problem: Education: Goal: Knowledge of the prescribed therapeutic regimen will improve Outcome: Progressing Goal: Ability to verbalize activity precautions or restrictions will improve Outcome: Progressing Goal: Understanding of discharge needs will improve Outcome: Progressing   Problem: Self-Care: Goal: Ability to meet self-care needs will improve Outcome: Progressing   Problem: Self-Care: Goal: Ability to meet self-care needs will improve Outcome: Progressing   Problem: Self-Concept: Goal: Ability to maintain and perform role responsibilities to the fullest extent possible will improve Outcome: Progressing   Problem: Skin Integrity: Goal: Demonstration of wound healing without infection will improve Outcome: Progressing   Problem: Education: Goal: Knowledge of General Education information will improve Description: Including pain rating scale, medication(s)/side effects and non-pharmacologic comfort measures Outcome: Progressing   Problem: Clinical Measurements: Goal: Ability to maintain clinical measurements within normal limits will improve Outcome: Progressing Goal: Will remain free from infection Outcome: Progressing Goal: Diagnostic test results will improve Outcome: Progressing Goal: Respiratory complications will improve Outcome: Progressing Goal:  Cardiovascular complication will be avoided Outcome: Progressing

## 2022-07-04 LAB — GLUCOSE, CAPILLARY
Glucose-Capillary: 208 mg/dL — ABNORMAL HIGH (ref 70–99)
Glucose-Capillary: 141 mg/dL — ABNORMAL HIGH (ref 70–99)
Glucose-Capillary: 175 mg/dL — ABNORMAL HIGH (ref 70–99)
Glucose-Capillary: 175 mg/dL — ABNORMAL HIGH (ref 70–99)

## 2022-07-04 NOTE — Progress Notes (Signed)
PROGRESS NOTE   Subjective/Complaints: No acute complaints.  No events overnight.  States he has some mild ongoing discomfort on the medial side of his stump wound, is using manual massage for desensitization which has been helpful.  Seen in the room with nephrology, doing much better from a volume standpoint.  ROS: + LLE pain - improving.  Patient denies fever, rash, sore throat, blurred vision, dizziness, nausea, vomiting, diarrhea, cough, shortness of breath or chest pain,   headache, or mood change.   Objective:   No results found.    Recent Labs    07/02/22 1036  WBC 8.6  HGB 8.7*  HCT 29.3*  PLT 230    Recent Labs    07/02/22 1036  NA 131*  K 4.7  CL 94*  CO2 23  GLUCOSE 84  BUN 71*  CREATININE 7.94*  CALCIUM 9.5    No intake or output data in the 24 hours ending 07/04/22 1455    Pressure Injury 06/27/22 Heel Right Deep Tissue Pressure Injury - Purple or maroon localized area of discolored intact skin or blood-filled blister due to damage of underlying soft tissue from pressure and/or shear. (Active)  06/27/22 2125  Location: Heel  Location Orientation: Right  Staging: Deep Tissue Pressure Injury - Purple or maroon localized area of discolored intact skin or blood-filled blister due to damage of underlying soft tissue from pressure and/or shear.  Wound Description (Comments):   Present on Admission: No    Physical Exam: Vital Signs Blood pressure 139/87, pulse 78, temperature 98.1 F (36.7 C), temperature source Oral, resp. rate 18, height 6' (1.829 m), weight 100.8 kg, SpO2 92 %.   Constitutional: No distress . Vital signs reviewed. HEENT: NCAT, EOMI, oral membranes moist Neck: supple Cardiovascular: RRR without murmur. No JVD    Respiratory/Chest: CTA Bilaterally without wheezes or rales. Normal effort    GI/Abdomen: BS +, non-tender, non-distended Ext: no clubbing, cyanosis, or edema Psych:  pleasant and cooperative  Skin: R heel wound, scattered abrasions proximally.     - 3/16: Left leg appears stable, no erythema or edema.  Surgical sites well-approximated.  Patient independently doffs and on his own shrinker sock appropriately.  Neuro:    Alert and awake, CN 2-12 grossly intact, follows commands    Comments: Decreased to light touch from R mid shin downwards;  Musculoskeletal:  3-16: Moving all 4 limbs antigravity and against resistance.  UE strength 5/5 in Biceps, triceps, WE, grip and FA B/L. Strength symmetrical and unchanged in BUE. Subjective sensory loss LUE? RLE- 5/5 in HF, KE, KF- but moderate foot drop- DF 1/5 and PF 1/5 LLE- 4/5 HF, KE--no change     Assessment/Plan: 1. Functional deficits which require 3+ hours per day of interdisciplinary therapy in a comprehensive inpatient rehab setting. Physiatrist is providing close team supervision and 24 hour management of active medical problems listed below. Physiatrist and rehab team continue to assess barriers to discharge/monitor patient progress toward functional and medical goals  Care Tool:  Bathing    Body parts bathed by patient: Right arm, Left arm, Abdomen, Chest, Front perineal area, Right upper leg, Left upper leg, Face, Buttocks, Right lower leg  Body parts bathed by helper: Buttocks, Right lower leg Body parts n/a: Left lower leg   Bathing assist Assist Level: Contact Guard/Touching assist (shower level seated)     Upper Body Dressing/Undressing Upper body dressing   What is the patient wearing?: Pull over shirt    Upper body assist Assist Level: Set up assist    Lower Body Dressing/Undressing Lower body dressing      What is the patient wearing?: Pants, Incontinence brief     Lower body assist Assist for lower body dressing: Moderate Assistance - Patient 50 - 74%     Toileting Toileting    Toileting assist Assist for toileting: Moderate Assistance - Patient 50 - 74%      Transfers Chair/bed transfer  Transfers assist     Chair/bed transfer assist level: Supervision/Verbal cueing     Locomotion Ambulation   Ambulation assist   Ambulation activity did not occur: Safety/medical concerns (Unable to ambulated at this time secondary to new BKA with global deconditioning)          Walk 10 feet activity   Assist  Walk 10 feet activity did not occur: Safety/medical concerns        Walk 50 feet activity   Assist Walk 50 feet with 2 turns activity did not occur: Safety/medical concerns         Walk 150 feet activity   Assist Walk 150 feet activity did not occur: Safety/medical concerns         Walk 10 feet on uneven surface  activity   Assist Walk 10 feet on uneven surfaces activity did not occur: Safety/medical concerns         Wheelchair     Assist Is the patient using a wheelchair?: Yes Type of Wheelchair: Manual    Wheelchair assist level: Supervision/Verbal cueing Max wheelchair distance: 150    Wheelchair 50 feet with 2 turns activity    Assist        Assist Level: Supervision/Verbal cueing   Wheelchair 150 feet activity     Assist      Assist Level: Supervision/Verbal cueing   Blood pressure 139/87, pulse 78, temperature 98.1 F (36.7 C), temperature source Oral, resp. rate 18, height 6' (1.829 m), weight 100.8 kg, SpO2 92 %.  Medical Problem List and Plan: 1. 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  -Continue CIR therapies including PT, OT            2.  Antithrombotics: -DVT/anticoagulation:  Pharmaceutical: Heparin             -antiplatelet therapy: NA 3. Pain Management: Oxycodone prn for pain. No phantom pain so far  -muscle rub for R thigh started- pain improved  3/12 gabapentin 200mg  qhs helpful for LUE,  no SE  3/13 pain controlled-doing well  4. Mood/Behavior/Sleep: LCSW to follow for evaluation and support.              -antipsychotic agents: N/A  -3/8 Melatonin 3mg  HS started  5. Neuropsych/cognition: This patient is capable of making decisions on his own behalf. 6. Skin/Wound Care: Routine pressure relief measures.   -ordered smaller 2X shrinkers per [employer] which pt is now wearing  -staples out- wear shrinker  directly over leg without dressing.  7. Fluids/Electrolytes/Nutrition: Strict I/O. Daily weights. Enforce 1200 cc FR.             --Reports on multiple supplements--d/c nephro.  --Continue Juven bid, Zinc and Vitamin-C to promote wound healing.  -Hyponatremia- improved on labs 3/4 to 131 8. Left calf staph positive cultures: IV antibiotics changed to Augmentin X 19 days per recs.  (stop 07/03/22) 9. HTN: Monitor BP TID--continue Avapro (reduced to 75mg  2/27) and Demadex -STTS.   -3/7 irbesartan was previously decreased to allow for more volume to be reduced by nephrology, continue to monitor, BP mildy elevated but stable overall, hesitant to increase BP medications due to dialysis, if BP increases further may need increase irbesartan, Add hydralazine PRN SBP > 180  -3/15 BP with fair to borderlinecontrol, continue current regimen, parameters placed for PT/OT - 3/16-17: Normotensive, monitor     07/04/2022    5:10 AM 07/04/2022    3:20 AM 07/03/2022    7:17 PM  Vitals with BMI  Weight 222 lbs 4 oz    BMI [id number]    Systolic  [id number]  Diastolic  87 76  Pulse  78 73    10. T2DM: Hgb A1C- 7.4. Was on Glimepiride and Rubelsus PTA.  -- Monitor BS ac/hs and use SSI for tighter control.         CBG (last 3)  Recent Labs    07/03/22 2100 07/04/22 0611 07/04/22 1209  GLUCAP 164* 208* 141*    3/12 increased am semglee to 12u  3/14-15 cbg's leveling out. Continue semglee 12u qam and 4u qpm   -continue scheduled novolog 4u tid ac  3/17: Blood sugars mildly elevated this a.m.,  monitor 1 to 2 days for trend  11. ESRD: Volume being managed with HD MWF. Is basically anuric- voided x2 since admission. Via fistula --  HD at the end of the day to help with tolerance of therapy.  12.  Leucocytosis due to LLE osteomyelitis- : Monitor for fevers and other signs of infection.              --augmentin complete 3/16   -3/13 WBC down to 7.3, improved 13. Hyponatremia: Being managed with HD.  14. Cough: resolved  15. Right hip pain: Fall 3 weeks PTA. Will order Voltaren gel TID.  16. Loose stools- due to IV ABX per pt- might need a stool bulker/fiber-   -3/1 Fibercon started 625mg  BID  -3/17-large BM this a.m.   LOS: 19 days A FACE TO [address] 07/04/2022, 2:55 PM

## 2022-07-04 NOTE — Progress Notes (Addendum)
[facility] Progress Note   Subjective: No issues reported over night. No C/Os. Sitting up in [facility] practicing his hand exercises. He is showing impressive commitment to rehab! Says he may be going to SNF 07/08/2022, hoping for placement in [place]. HD tomorrow on schedule.   Objective Vitals:   07/03/22 1456 07/03/22 1917 07/04/22 0320 07/04/22 0510  BP: 126/77 127/76 139/87   Pulse: 74 73 78   Resp: 17 18 18    Temp: 97.9 F (36.6 C) 98 F (36.7 C) 98.1 F (36.7 C)   TempSrc:  Oral Oral   SpO2: 97% 96% 92%   Weight:    100.8 kg  Height:       Physical Exam General: chronically ill appearing male in NAD Heart: S1,S2 No M/R/G Lungs: CTAB. No WOB.  Abdomen: NT, NABS Extremities: L BKA with stump protector in place.. chronic stasis changes RLE. Dialysis Access: L AVF +T/B    Additional Objective Labs: Basic Metabolic Panel: Recent Labs  Lab 06/28/22 0610 06/30/22 0611 07/02/22 1036  NA 132* 132* 131*  K 4.9 4.9 4.7  CL 93* 93* 94*  CO2 25 24 23   GLUCOSE 137* 149* 84  BUN 77* 79* 71*  CREATININE 8.46* 8.38* 7.94*  CALCIUM 9.3 9.4 9.5  PHOS 5.3* 5.2* 4.8*   Liver Function Tests: Recent Labs  Lab 06/28/22 0610 06/30/22 0611 07/02/22 1036  ALBUMIN 2.2* 2.3* 2.6*   No results for input(s): "LIPASE", "AMYLASE" in the last 168 hours. CBC: Recent Labs  Lab 06/28/22 0610 06/30/22 0611 07/02/22 1036  WBC 8.8 7.3 8.6  NEUTROABS 7.3  --   --   HGB 8.0* 8.4* 8.7*  HCT 26.9* 26.8* 29.3*  MCV 90.9 89.0 91.8  PLT 204 199 230   Blood Culture    Component Value Date/Time   SDES TISSUE 06/11/2022 1449   SPECREQUEST LEFT BKA 06/11/2022 1449   CULT  06/11/2022 1449    RARE STAPHYLOCOCCUS AUREUS NO ANAEROBES ISOLATED Performed at [facility], [place] [address], [place], [place] [address]    REPTSTATUS 06/17/2022 FINAL 06/11/2022 1449    Cardiac Enzymes: No results for input(s): "CKTOTAL", "CKMB", "CKMBINDEX", "TROPONINI" in the last 168  hours. CBG: Recent Labs  Lab 07/03/22 0601 07/03/22 1147 07/03/22 1707 07/03/22 2100 07/04/22 0611  GLUCAP 102* 103* 163* 164* 208*   Iron Studies: No results for input(s): "IRON", "TIBC", "TRANSFERRIN", "FERRITIN" in the last 72 hours. @lablastinr3 @ Studies/Results: No results found. Medications:   (feeding supplement) PROSource Plus  30 mL Oral BID BM   amoxicillin-clavulanate  1 tablet Oral q1800   vitamin C  1,000 mg Oral Daily   atorvastatin  40 mg Oral Daily   calcitRIOL  1.25 mcg Oral Q M,W,F-HD   carvedilol  25 mg Oral BID WC   darbepoetin (ARANESP) injection - DIALYSIS  200 mcg Subcutaneous Q Tue-1800   diclofenac Sodium  2 g Topical TID AC   gabapentin  200 mg Oral QHS   Gerhardt's butt cream   Topical BID   guaiFENesin  600 mg Oral BID   heparin  5,000 Units Subcutaneous Q8H   insulin aspart  0-5 Units Subcutaneous QHS   insulin aspart  0-9 Units Subcutaneous TID with meals   insulin aspart  4 Units Subcutaneous TID WC   insulin glargine-yfgn  12 Units Subcutaneous Daily   And   insulin glargine-yfgn  4 Units Subcutaneous Daily   irbesartan  75 mg Oral Daily   lactobacillus  1 g  Oral TID WC   melatonin  3 mg Oral QHS   multivitamin  1 tablet Oral QHS   pantoprazole  40 mg Oral Daily   polycarbophil  625 mg Oral BID   sucroferric oxyhydroxide  1,000 mg Oral TID WC   torsemide  100 mg Oral Once per day on Sun Tue Thu Sat     Dialysis Orders: [facility] MWF 4h 68min  400/800   108.5kg   2/2 bath  LFA AVF   - No Heparin  - rocaltrol 1.25 mcg po tiw - mircera 225 mcg IV  q 2wks, last 2/12, due 2/26 - last Hb 8.6 on 2/14   Assessment/Plan: Leukocytosis/L foot 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 On Augmentin.  Hyponatremia - worsening in the setting of hypervolemia. managing with HD/UF. Fluid restriction reinforced. Resolved, Na+ 132.  ESRD - on HD MWF. HD 07/05/2022 per regular schedule.  HTN/ volume - Getting under edw, continues to have edema, UF as  tolerated.  EDW should be lower on d/c post BKA, last post wt 101.5kg. BP at goal today. Recently lowered irbesartan 75 mg PO daily started on 06/16/22 to facilitate volume removal. Weight appears more believable now. Continue to UF as tolerated, optimize volume with HD.  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 On Aranesp 223mcg qTues starting 06/22/22. Check iron panel when he completes ABX.  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 Cont po vdra and velphoro as binder.  DM2 - Chronic hyperglycemia, poorly controlled DMT2. PMD adjusting meds.  Nutrition - hypoalbuminemia- Continue protein supplement. Fluid restriction    Jimmye Norman. Brown NP-C 07/04/2022, 9:31 AM  [employer] [phone]   Seen and examined independently.  Agree with note and exam as documented above by physician extender and as noted here.  States he doesn't have scheduled therapy today so he is doing leg lifts and other exercises in his wheelchair.  His towel fell from his wheelchair and his leg was rubbing against a hard metal piece; I got him a new towel.  States was dizzy after HD on Friday.   General adult male in bed in no acute distress HEENT normocephalic atraumatic extraocular movements intact sclera anicteric Neck supple trachea midline Lungs clear to auscultation bilaterally normal work of breathing at rest  Heart S1S2 no rub  Abdomen soft nontender distended/obese habitus  Extremities right leg without edema; left BKA Psych normal mood and affect Neuro alert and oriented provides a hx and follows commands Access LUE AVF with bruit and thrill    ESRD - HD per MWF schedule.  Note that we will need to lower his EDW s/p amputation.  Note 100.4 kg is post weight from 3/15 however 103/72 BP the morning afterward and pt dizzy.  May need EDW of 101 - 101.5 kg.    Left foot abscess - abx per primary team.  S/p left BKA   HTN - optimize volume with HD.  S/p reduction in irbesartan per charting   Anemia CKD - on  aranesp 200 mcg weekly. Coming up slowly  Claudia Desanctis, MD 07/04/2022 1:57 PM

## 2022-07-05 DIAGNOSIS — T82898A Other specified complication of vascular prosthetic devices, implants and grafts, initial encounter: Secondary | ICD-10-CM

## 2022-07-05 DIAGNOSIS — D72829 Elevated white blood cell count, unspecified: Secondary | ICD-10-CM

## 2022-07-05 DIAGNOSIS — Z992 Dependence on renal dialysis: Secondary | ICD-10-CM

## 2022-07-05 DIAGNOSIS — N186 End stage renal disease: Secondary | ICD-10-CM

## 2022-07-05 LAB — RENAL FUNCTION PANEL
Albumin: 2.4 g/dL — ABNORMAL LOW (ref 3.5–5.0)
Anion gap: 12 (ref 5–15)
BUN: 87 mg/dL — ABNORMAL HIGH (ref 6–20)
CO2: 24 mmol/L (ref 22–32)
Calcium: 9.3 mg/dL (ref 8.9–10.3)
Chloride: 94 mmol/L — ABNORMAL LOW (ref 98–111)
Creatinine, Ser: 9.45 mg/dL — ABNORMAL HIGH (ref 0.61–1.24)
GFR, Estimated: 6 mL/min — ABNORMAL LOW (ref >60)
Glucose, Bld: 157 mg/dL — ABNORMAL HIGH (ref 70–99)
Phosphorus: 4.2 mg/dL (ref 2.5–4.6)
Potassium: 4.6 mmol/L (ref 3.5–5.1)
Sodium: 130 mmol/L — ABNORMAL LOW (ref 135–145)

## 2022-07-05 LAB — CBC
HCT: 27.8 % — ABNORMAL LOW (ref 39.0–52.0)
Hemoglobin: 8.3 g/dL — ABNORMAL LOW (ref 13.0–17.0)
MCH: 27.3 pg (ref 26.0–34.0)
MCHC: 29.9 g/dL — ABNORMAL LOW (ref 30.0–36.0)
MCV: 91.4 fL (ref 80.0–100.0)
Platelets: 228 10*3/uL (ref 150–400)
RBC: 3.04 MIL/uL — ABNORMAL LOW (ref 4.22–5.81)
RDW: 18.2 % — ABNORMAL HIGH (ref 11.5–15.5)
WBC: 7.8 10*3/uL (ref 4.0–10.5)
nRBC: 0 % (ref 0.0–0.2)

## 2022-07-05 LAB — IRON AND TIBC
Iron: 30 ug/dL — ABNORMAL LOW (ref 45–182)
Saturation Ratios: 14 % — ABNORMAL LOW (ref 17.9–39.5)
TIBC: 218 ug/dL — ABNORMAL LOW (ref 250–450)
UIBC: 188 ug/dL

## 2022-07-05 LAB — GLUCOSE, CAPILLARY
Glucose-Capillary: 139 mg/dL — ABNORMAL HIGH (ref 70–99)
Glucose-Capillary: 109 mg/dL — ABNORMAL HIGH (ref 70–99)
Glucose-Capillary: 158 mg/dL — ABNORMAL HIGH (ref 70–99)
Glucose-Capillary: 167 mg/dL — ABNORMAL HIGH (ref 70–99)

## 2022-07-05 MED ORDER — CALCIUM POLYCARBOPHIL 625 MG PO TABS: 625.0000 mg | ORAL_TABLET | Freq: Two times a day (BID) | ORAL | Status: DC

## 2022-07-05 MED ORDER — TRAZODONE HCL 50 MG PO TABS: 25.0000 mg | ORAL_TABLET | Freq: Every evening | ORAL | Status: DC | PRN

## 2022-07-05 MED ORDER — INSULIN GLARGINE-YFGN 100 UNIT/ML ~~LOC~~ SOLN: 12.0000 [IU] | Freq: Every day | SUBCUTANEOUS | 11 refills | Status: DC

## 2022-07-05 MED ORDER — PANTOPRAZOLE SODIUM 40 MG PO TBEC: 40.0000 mg | DELAYED_RELEASE_TABLET | Freq: Every day | ORAL | Status: DC

## 2022-07-05 MED ORDER — PROSOURCE PLUS PO LIQD: 30.0000 mL | Freq: Two times a day (BID) | ORAL | Status: DC

## 2022-07-05 MED ORDER — INSULIN GLARGINE-YFGN 100 UNIT/ML ~~LOC~~ SOLN: 4.0000 [IU] | Freq: Every day | SUBCUTANEOUS | 11 refills | Status: DC

## 2022-07-05 MED ORDER — MELATONIN 3 MG PO TABS: 3.0000 mg | ORAL_TABLET | Freq: Every day | ORAL | 0 refills | Status: DC

## 2022-07-05 MED ORDER — LOPERAMIDE HCL 2 MG PO CAPS: 2.0000 mg | ORAL_CAPSULE | Freq: Three times a day (TID) | ORAL | 0 refills | Status: DC | PRN

## 2022-07-05 MED ORDER — GABAPENTIN 100 MG PO CAPS: 200.0000 mg | ORAL_CAPSULE | Freq: Every day | ORAL | Status: DC

## 2022-07-05 MED ORDER — HYDRALAZINE HCL 10 MG PO TABS: 10.0000 mg | ORAL_TABLET | Freq: Four times a day (QID) | ORAL | Status: DC | PRN

## 2022-07-05 MED ORDER — GUAIFENESIN ER 600 MG PO TB12: 600.0000 mg | ORAL_TABLET | Freq: Two times a day (BID) | ORAL | Status: DC

## 2022-07-05 MED ORDER — INSULIN ASPART 100 UNIT/ML IJ SOLN: 4.0000 [IU] | Freq: Three times a day (TID) | INTRAMUSCULAR | 11 refills | Status: DC

## 2022-07-05 MED ORDER — DICLOFENAC SODIUM 1 % EX GEL: 2.0000 g | Freq: Three times a day (TID) | CUTANEOUS | Status: DC

## 2022-07-05 MED ORDER — MUSCLE RUB 10-15 % EX CREA: 1.0000 | TOPICAL_CREAM | CUTANEOUS | 0 refills | Status: DC | PRN

## 2022-07-05 NOTE — Progress Notes (Signed)
PROGRESS NOTE   Subjective/Complaints: PT working with therapy in gym. Reports pain is under control. Cough continues to improve. He reports he is having a good day with therapy since he was able to rest this weekend.  He was seen by vascular today.   ROS: + LLE pain - improving.  Patient denies fever, rash, nausea, vomiting, diarrhea, abdominal pain, shortness of breath or chest pain,   headache, or mood change.   Objective:   No results found.    Recent Labs    07/05/22 0921  WBC 7.8  HGB 8.3*  HCT 27.8*  PLT 228    Recent Labs    07/05/22 0921  NA 130*  K 4.6  CL 94*  CO2 24  GLUCOSE 157*  BUN 87*  CREATININE 9.45*  CALCIUM 9.3     Intake/Output Summary (Last 24 hours) at 07/05/2022 1257 Last data filed at 07/05/2022 0818 Gross per 24 hour  Intake 480 ml  Output --  Net 480 ml      Pressure Injury 06/27/22 Heel Right Deep Tissue Pressure Injury - Purple or maroon localized area of discolored intact skin or blood-filled blister due to damage of underlying soft tissue from pressure and/or shear. (Active)  06/27/22 2125  Location: Heel  Location Orientation: Right  Staging: Deep Tissue Pressure Injury - Purple or maroon localized area of discolored intact skin or blood-filled blister due to damage of underlying soft tissue from pressure and/or shear.  Wound Description (Comments):   Present on Admission: No    Physical Exam: Vital Signs Blood pressure 138/84, pulse 74, temperature 98.6 F (37 C), temperature source Oral, resp. rate 17, height 6' (1.829 m), weight 100.5 kg, SpO2 95 %.   Constitutional: No distress . Vital signs reviewed. Working with therapy in gym HEENT: NCAT, conjugate gaze Neck: supple Cardiovascular: RRR without murmur. No JVD    Respiratory/Chest: CTA Bilaterally without wheezes or rales. Good air movement GI/Abdomen: BS +, non-tender, non-distended Ext: no clubbing,  cyanosis, or edema Psych: pleasant and cooperative  Skin: R heel wound, scattered abrasions proximally.    Neuro:    Alert and awake, CN 2-12 grossly intact, follows commands    Comments: Decreased to light touch from R mid shin downwards;  Musculoskeletal:   Moving all 4 limbs antigravity and against resistance.   UE strength 5/5 in Biceps, triceps, WE, grip and FA B/L. Strength symmetrical and unchanged in BUE. Subjective sensory loss LUE? RLE- 5/5 in HF, KE, KF- but moderate foot drop- DF 1/5 and PF 1/5 LLE- 4/5 HF, KE--no change     Assessment/Plan: 1. Functional deficits which require 3+ hours per day of interdisciplinary therapy in a comprehensive inpatient rehab setting. Physiatrist is providing close team supervision and 24 hour management of active medical problems listed below. Physiatrist and rehab team continue to assess barriers to discharge/monitor patient progress toward functional and medical goals  Care Tool:  Bathing    Body parts bathed by patient: Right arm, Left arm, Abdomen, Chest, Front perineal area, Right upper leg, Left upper leg, Face, Buttocks, Right lower leg   Body parts bathed by helper: Buttocks, Right lower leg Body parts n/a: Left  lower leg   Bathing assist Assist Level: Contact Guard/Touching assist (shower level seated)     Upper Body Dressing/Undressing Upper body dressing   What is the patient wearing?: Pull over shirt    Upper body assist Assist Level: Set up assist    Lower Body Dressing/Undressing Lower body dressing      What is the patient wearing?: Pants, Incontinence brief     Lower body assist Assist for lower body dressing: Moderate Assistance - Patient 50 - 74%     Toileting Toileting    Toileting assist Assist for toileting: Moderate Assistance - Patient 50 - 74%     Transfers Chair/bed transfer  Transfers assist     Chair/bed transfer assist level: Supervision/Verbal cueing      Locomotion Ambulation   Ambulation assist   Ambulation activity did not occur: Safety/medical concerns (Unable to ambulated at this time secondary to new BKA with global deconditioning)          Walk 10 feet activity   Assist  Walk 10 feet activity did not occur: Safety/medical concerns        Walk 50 feet activity   Assist Walk 50 feet with 2 turns activity did not occur: Safety/medical concerns         Walk 150 feet activity   Assist Walk 150 feet activity did not occur: Safety/medical concerns         Walk 10 feet on uneven surface  activity   Assist Walk 10 feet on uneven surfaces activity did not occur: Safety/medical concerns         Wheelchair     Assist Is the patient using a wheelchair?: Yes Type of Wheelchair: Manual    Wheelchair assist level: Supervision/Verbal cueing Max wheelchair distance: 150    Wheelchair 50 feet with 2 turns activity    Assist        Assist Level: Supervision/Verbal cueing   Wheelchair 150 feet activity     Assist      Assist Level: Supervision/Verbal cueing   Blood pressure 138/84, pulse 74, temperature 98.6 F (37 C), temperature source Oral, resp. rate 17, height 6' (1.829 m), weight 100.5 kg, SpO2 95 %.  Medical Problem List and Plan: 1. 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  -Continue CIR therapies including PT, OT    -DC to SNF thursday     2.  Antithrombotics: -DVT/anticoagulation:  Pharmaceutical: Heparin             -antiplatelet therapy: NA 3. Pain Management: Oxycodone prn for pain. No phantom pain so far  -muscle rub for R thigh started- pain improved  3/12 gabapentin 200mg  qhs helpful for LUE, no SE  3/13 pain controlled-doing well  4. Mood/Behavior/Sleep: LCSW to follow for evaluation and support.               -antipsychotic agents: N/A  -3/8 Melatonin 3mg  HS started  5. Neuropsych/cognition: This patient is capable of making decisions on his own behalf. 6. Skin/Wound Care: Routine pressure relief measures.   -ordered smaller 2X shrinkers per [employer] which pt is now wearing  -staples out- wear shrinker directly over leg without dressing.  7. Fluids/Electrolytes/Nutrition: Strict I/O. Daily weights. Enforce  1200 cc FR.             --Reports on multiple supplements--d/c nephro.  --Continue Juven bid, Zinc and Vitamin-C to promote wound healing.  -Hyponatremia- improved on labs 3/4 to 131 8. Left calf staph positive cultures: IV antibiotics changed to Augmentin X 19 days per recs.  (stop 07/03/22) 9. HTN: Monitor BP TID--continue Avapro (reduced to 75mg  2/27) and Demadex -STTS.   -3/7 irbesartan was previously decreased to allow for more volume to be reduced by nephrology, continue to monitor, BP mildy elevated but stable overall, hesitant to increase BP medications due to dialysis, if BP increases further may need increase irbesartan, Add hydralazine PRN SBP > 180  -3/15 BP with fair to borderlinecontrol, continue current regimen, parameters placed for PT/OT - 3/18  Bps well controlled overall, continue to monitor     07/05/2022   12:47 PM 07/05/2022    7:18 AM 07/05/2022    3:47 AM  Vitals with BMI  Weight  221 lbs 9 oz   BMI  [id number]   Systolic 0000000  [id number]  Diastolic 84  89  Pulse 74  71    10. T2DM: Hgb A1C- 7.4. Was on Glimepiride and Rubelsus PTA.  -- Monitor BS ac/hs and use SSI for tighter control.         CBG (last 3)  Recent Labs    07/04/22 2110 07/05/22 0557 07/05/22 1218  GLUCAP 175* 139* 109*    3/12 increased am semglee to 12u  3/14-15 cbg's leveling out. Continue semglee 12u qam and 4u qpm   -continue scheduled novolog 4u tid ac  3/17: Blood sugars mildly elevated this a.m., monitor 1 to 2 days for trend  3/18 CBGS appear improved today, continue to monitor    11. ESRD: Volume being managed with HD MWF. Is basically anuric- voided x2 since admission. Via fistula --  HD at the end of the day to help with tolerance of therapy.   -3/18 Revision fo Left AVF fistula planned for Wednesday by vascular 12.  Leucocytosis due to LLE osteomyelitis- : Monitor for fevers and other signs of infection.              --augmentin complete 3/16   -3/18 WBC WNL at 7.8 13. Hyponatremia: Being managed with HD.  14. Cough: resolved  15. Right hip pain: Fall 3 weeks PTA. Will order Voltaren gel TID.  16. Loose stools- due to IV ABX per pt- might need a stool bulker/fiber-   -3/1 Fibercon started 625mg  BID  -3/17-large BM this a.m.   LOS: 20 days A FACE TO FACE EVALUATION WAS PERFORMED  Jennye Boroughs 07/05/2022, 12:57 PM

## 2022-07-05 NOTE — Progress Notes (Signed)
Occupational Therapy Session Note  Patient Details  Name: Scott Castillo MRN: [id number] Date of Birth: 10/26/1960  Session 1 Today's Date: 07/05/2022 OT Individual Time: [id number] OT Individual Time Calculation (min): 69 min    Session 2 Today's Date: 07/05/2022 OT Individual Time: [id number] OT Individual Time Calculation (min): 42 min    Session 3 Today's Date: 07/05/2022 OT Individual Time: 1130-1200 OT Individual Time Calculation (min): 30 min    Short Term Goals: Week 3:  OT Short Term Goal 1 (Week 3): STG= LTG d/t SNF d/c  Skilled Therapeutic Interventions/Progress Updates:    Session 1 Pt received sitting up with no c/o pain, agreeable to OT session. He completed w/c propulsion to the therapy gym with (S)- MUCH improved endurance in propulsion overall and he maintained a great, efficient speed. Squat pivot to the mat with (S). He completed sit > stand with min A from 21 in high mat. He stood with min A while alternating grasp release from RW. He required a seated rest break and then stood again, requiring mod A this time. He completed alternating functional reaching forward, inside BOS to challenge standing balance. He then completed 3x10 tricep extensions from the EOM on yoga blocks- intended carryover for sit > stands. He transitioned to supine on the mat and completed 3x10 glute bridges to strengthen glutes for carryover to standing fully upright.  He returned to his room and was left sitting up with all needs met.   Session 2 Pt received sitting up with no c/o pain, agreeable to OT session. Intervention at shower level for ADL retraining. Squat pivot transfer with CGA to the TTB in the shower. He stood with B grab bar use with min A to doff LB clothing- min A required. He completed the remainder of bathing seated with (S). Sit > stand from the w/c with min A using B grab bars for posterior peri hygiene. He was only able to sustain stand with unilateral UE support for about  ~10 seconds before rapidly sitting. He transitioned back to the w/c, squat pivot with CGA. He donned a shirt with set up assist seated. He attempted to stand from the w/c at the sink x3 repetitions. The second trial he had large translation of weight forward and required quick intervention from OT to not slide out of the chair- heavy mod A to prevent fall. The 3rd trial he stood with mod A at the trunk and blocking of the RLE. He completed LB dressing with mod A overall. Direct handoff to PT Vladimir Creeks in room.   Session 3 Pt received sitting with no c/o pain, agreeable to OT session. Pt propelled the w/c 100 ft to the therapy gym with (S). Session focused on BUE strengthening circuit to increase sit > stand and UE reliance on the RW. He had BUE on a 4 lb dumbbell for functional reaching overhead and forward, 3x10 repetitions. He then transitioned to using the BUE ergometer for 5 min forward and 5 min backward on level 8.0 resistance. Direct carryover to w/c propulsion. He returned to his room and was left sitting up with all needs met.     Therapy Documentation Precautions:  Precautions Precautions: Fall Precaution Comments: L BKA Other Brace: L limb protector Restrictions Weight Bearing Restrictions: Yes LLE Weight Bearing: Non weight bearing   Therapy/Group: Individual Therapy  Curtis Sites 07/05/2022, 6:52 AM

## 2022-07-05 NOTE — Progress Notes (Signed)
Received patient in bed to unit.  Alert and oriented.  Informed consent signed and in chart.   Celeste duration:3  Patient tolerated well.  Transported back to the room  Alert, without acute distress.  Hand-off given to patient's nurse.   Access used: AVF Access issues: none  Total UF removed: 3.5 L Medication(s) given: none Post HD VS: 140/82 P 84 R 20. O2 sat 98 in room air. Post HD weight: 104 kg   [facility]

## 2022-07-05 NOTE — Progress Notes (Signed)
Advised by rehab CSW that pt will d/c to snf on Thursday. Contacted [facility] to make clinic aware of d/c date and that pt will resume care on Friday.   Melven Sartorius Renal Navigator [phone]

## 2022-07-05 NOTE — Consult Note (Addendum)
Hospital Consult    Reason for Consult:  thinning of skin over left AV fistula Requesting Physician:  Jen Mow, PA MRN #:  [id number]  History of Present Illness: This is a 62 y.o. male who vascular surgery has been consulted for revision of left AV fistula with concerns of thinning of skin overlying the fistula. His left radiocephalic AV fistula was created in June of 2018 by Dr. Donnetta Hutching. Mr. Prajapati reports no Rhoda issues with the fistula since its creation. He reports that it has been functioning well without any bleeding issues. He is unsure how long there have been aneurysmal areas on the fistula. He reports that the dialysis techs as well and Nephrology PA just voiced their concerns regarding a thinned area. 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   H/O pancreatic cancer    HLD (hyperlipidemia)    HTN (hypertension)    Lumbar radiculopathy, chronic    hemilamectomy '94   Venous stasis dermatitis of both lower extremities     Past Surgical History:  Procedure Laterality Date   A/V FISTULAGRAM Left 09/27/2019   Procedure: A/V FISTULAGRAM;  Surgeon: Marty Heck, MD;  Location: [facility];  Service: Cardiovascular;  Laterality: Left;   AMPUTATION Left 06/11/2022   Procedure: LEFT BELOW THE KNEE AMPUTATION;  Surgeon: Newt Minion, MD;  Location: [facility];  Service: Orthopedics;  Laterality: Left;   AV FISTULA PLACEMENT Left 09/17/2016   Procedure: ARTERIOVENOUS (AV) FISTULA CREATION;  Surgeon: [name], MD;  Location: [facility];  Service: Vascular;  Laterality: Left;   CATARACT EXTRACTION W/ INTRAOCULAR LENS IMPLANT Left 2015   ENDOVENOUS ABLATION SAPHENOUS VEIN W/ LASER Right 04/08/2016   EVLA R greater saphenous vein by Curt Jews MD   ENDOVENOUS ABLATION SAPHENOUS VEIN W/ LASER Left  05/13/2016   endovenous laser ablation (left greater saphenous vein) by Curt Jews MD    FOOT SURGERY     HEMILAMINOTOMY LUMBAR SPINE  '94   TONSILLECTOMY      Allergies  Allergen Reactions   Pioglitazone Swelling    Site of swelling not recalled by patient   Adhesive [Tape] Other (See Comments)    Causes redness, pt prefers paper tape    Heparin Other (See Comments)    Per pt he has episodes where he can't speak. He has tolerated this med multiple times since this entered so unlikely it is true allergy   Latex Rash    Redness, also    Prior to Admission medications   Medication Sig Start Date End Date Taking? Authorizing Provider  ascorbic acid (VITAMIN C) 1000 MG tablet Take 1,000 mg by mouth daily.    [provider]  atorvastatin (LIPITOR) 40 MG tablet Take 1 tablet (40 mg total) by mouth daily. 04/22/21 06/09/23  Dorethea Clan, DO  calcitRIOL (ROCALTROL) 0.25 MCG capsule Take 5 capsules (1.25 mcg total) by mouth every Monday, Wednesday, and Friday with hemodialysis. 06/16/22   Lavina Hamman, MD  carvedilol (COREG) 25 MG tablet Take 1 tablet (25 mg total) by mouth 2 (two) times daily with a meal. 04/22/21 06/09/23  Atway, Rayann N, DO  co-enzyme Q-10 30 MG capsule Take 30 mg by mouth 2 (two) times daily.    [provider]  cyanocobalamin  1000 MCG tablet Take 1,000 mcg by mouth daily.    [provider]  irbesartan (AVAPRO) 75 MG tablet Take 1 tablet (75 mg total) by mouth daily. 06/16/22   Lavina Hamman, MD  lactobacillus (FLORANEX/LACTINEX) PACK Take 1 packet (1 g total) by mouth 3 (three) times daily with meals. 06/15/22   Lavina Hamman, MD  loperamide (IMODIUM) 2 MG capsule Take 1 capsule (2 mg total) by mouth every 6 (six) hours as needed for diarrhea or loose stools. 09/08/20   Raiford Noble Latif, DO  multivitamin (RENA-VIT) TABS tablet Take 1 tablet by mouth at bedtime. 06/15/22   Lavina Hamman, MD  nutrition supplement, Fanny Dance, Fanny Dance) PACK Take 1  packet by mouth 2 (two) times daily between meals. 06/15/22   Lavina Hamman, MD  Nutritional Supplements (,FEEDING SUPPLEMENT, PROSOURCE PLUS) liquid Take 30 mLs by mouth 3 (three) times daily between meals. 06/15/22   Lavina Hamman, MD  Nutritional Supplements (FEEDING SUPPLEMENT, NEPRO CARB STEADY,) LIQD Take 237 mLs by mouth 2 (two) times daily between meals. 06/15/22   Lavina Hamman, MD  Nystatin (GERHARDT'S BUTT CREAM) CREA Apply 1 Application topically 2 (two) times daily. 06/15/22   Lavina Hamman, MD  oxyCODONE (OXY IR/ROXICODONE) 5 MG immediate release tablet Take 1 tablet (5 mg total) by mouth every 6 (six) hours as needed for severe pain or moderate pain. 06/15/22   Lavina Hamman, MD  polyethylene glycol powder (GLYCOLAX/MIRALAX) 17 GM/SCOOP powder Take 17 g by mouth daily as needed for mild constipation. 06/15/22   Lavina Hamman, MD  torsemide (DEMADEX) 100 MG tablet Take 100 mg by mouth See admin instructions. Takes on Tuesday,Thursday,Saturday and Sunday(non-dialysis days) 02/20/20   [provider]  VELPHORO 500 MG chewable tablet Chew 1,000 mg by mouth 3 (three) times daily with meals. 03/15/22   [provider]  zinc sulfate 220 (50 Zn) MG capsule Take 1 capsule (220 mg total) by mouth daily. 06/16/22   Lavina Hamman, MD    Social History   Socioeconomic History   Marital status: Married    Spouse name: Not on file   Number of children: Not on file   Years of education: Not on file   Highest education level: Not on file  Occupational History   Not on file  Tobacco Use   Smoking status: Never   Smokeless tobacco: Never  Substance and Sexual Activity   Alcohol use: No   Drug use: No   Sexual activity: Not on file  Other Topics Concern   Not on file  Social History Narrative   Not on file   Social Determinants of Health   Financial Resource Strain: Not on file  Food Insecurity: No Food Insecurity (06/08/2022)   Hunger Vital Sign    Worried  About Running Out of Food in the Last Year: Never true    Ran Out of Food in the Last Year: Never true  Transportation Needs: No Transportation Needs (06/08/2022)   PRAPARE - Hydrologist (Medical): No    Lack of Transportation (Non-Medical): No  Physical Activity: Not on file  Stress: Not on file  Social Connections: Not on file  Intimate Partner Violence: Not At Risk (06/08/2022)   Humiliation, Afraid, Rape, and Kick questionnaire    Fear of Current or Ex-Partner: No    Emotionally Abused: No    Physically Abused: No    Sexually Abused: No  Family History  Problem Relation Age of Onset   Edema Mother    Heart attack Father    Heart failure Father    Heart attack Brother    Heart attack Brother     ROS: Otherwise negative unless mentioned in HPI  Physical Examination  Vitals:   07/04/22 2112 07/05/22 0347  BP: (!) 153/85 (!) 143/89  Pulse: 69 71  Resp: 17 18  Temp: 98.1 F (36.7 C) 98 F (36.7 C)  SpO2: 97% 95%   Body mass index is 30.05 kg/m.  General:  WDWN in NAD Gait: Not observed HENT: WNL, normocephalic Pulmonary: normal non-labored breathing, without wheezing Cardiac: regular Vascular Exam/Pulses: 2+ left radial pulse. Left radiocephalic AV fistula with good thrill. There are two aneurysmal areas , the more mid forearm aneurysm does have a more thinned out area. The skin is not compromised. There is no bleeding or ulceration as shown below. Unable to mobilize the skin over the mid aneurysmal area  Musculoskeletal: no muscle wasting or atrophy  Neurologic: A&O X 3;  No focal weakness or paresthesias are detected; speech is fluent/normal Psychiatric:  The pt has Normal affect.   CBC    Component Value Date/Time   WBC 7.8 07/05/2022 0921   RBC 3.04 (L) 07/05/2022 0921   HGB 8.3 (L) 07/05/2022 0921   HCT 27.8 (L) 07/05/2022 0921   PLT 228 07/05/2022 0921   MCV 91.4 07/05/2022 0921   MCH 27.3 07/05/2022 0921   MCHC 29.9  (L) 07/05/2022 0921   RDW 18.2 (H) 07/05/2022 0921   LYMPHSABS 0.6 (L) 06/28/2022 0610   MONOABS 0.6 06/28/2022 0610   EOSABS 0.2 06/28/2022 0610   BASOSABS 0.1 06/28/2022 0610    BMET    Component Value Date/Time   NA 130 (L) 07/05/2022 0921   K 4.6 07/05/2022 0921   CL 94 (L) 07/05/2022 0921   CO2 24 07/05/2022 0921   GLUCOSE 157 (H) 07/05/2022 0921   BUN 87 (H) 07/05/2022 0921   CREATININE 9.45 (H) 07/05/2022 0921   CALCIUM 9.3 07/05/2022 0921   GFRNONAA 6 (L) 07/05/2022 0921   GFRAA 14 (L) 07/06/2016 0502    COAGS: Lab Results  Component Value Date   INR 1.3 (H) 04/21/2021   INR 1.3 (H) 04/20/2021   INR 1.2 10/07/2020     Non-Invasive Vascular Imaging:   none  Statin:  Yes.   Beta Blocker:  Yes.   Aspirin:  No. ACEI:  No. ARB:  Yes.   CCB use:  No Other antiplatelets/anticoagulants:  No.    ASSESSMENT/PLAN: This is a 62 y.o. male with thinning aneurysmal area of his left radiocephalic AV fistula. Fistula does have some thinning of the skin in mid fistula. There is no ulceration present or bleeding concerns but the skin is rather thin and the overlying tissue does not mobilize. Discussed with patient recommendation for revision and he is agreeable to proceed. Plan will be to just revise the mid forearm aneurysmal area however did let him know that we may need to revise both aneurysms and if so he will need TDC. We will try to avoid the [facility] if possible. He does not take any blood thinners that will need to be held. - He normally dialyzes on MWF so will need to rearrange dialysis for Wednesday or he can go after his OR time - Plan will be for revision of left AV fistula with plication and possible insertion of TDC by Dr. Carlis Abbott on Wednesday 07/07/22  Karoline Caldwell PA-C [facility] [phone] 07/05/2022  12:18 PM  I have seen and evaluated the patient. I agree with the PA note as documented above.  62 year old male with end-stage renal disease  that vascular surgery has been consulted for thinning skin over his left arm AV fistula.  This is a radiocephalic AV fistula previously placed by Dr. Donnetta Hutching.  He states this is working well.  States the concern has been the more proximal aneurysmal segment where the skin has become much thinner over the last several weeks.  Discussed that I can plicate this in the OR on Wednesday with possible TDC.  He would like to avoid [facility] so discussed we could do the more concerning segment first and potentially do this in stages if needed and they would leave the room to cannulate distally.  He is here in rehab after recent BKA.  He is amendable to proceed.  Posted for Wednesday in the OR with me.  Marty Heck, MD [facility] Office: [phone]

## 2022-07-05 NOTE — Progress Notes (Signed)
[facility] Progress Note   Subjective:   Patient seen and examined at bedside.  Waiting to start next therapy. Denies CP, SOB, abdominal pain and n/v/d.    Objective Vitals:   07/04/22 1917 07/04/22 2112 07/05/22 0347 07/05/22 0718  BP: (!) 149/84 (!) 153/85 (!) 143/89   Pulse: 74 69 71   Resp: 18 17 18    Temp: 98 F (36.7 C) 98.1 F (36.7 C) 98 F (36.7 C)   TempSrc: Oral Oral Oral   SpO2: 95% 97% 95%   Weight:    100.5 kg  Height:       Physical Exam General:chronically ill appearing male in NAD Heart:RRR, no mrg Lungs:CTAB, nml WOB on RA Abdomen:soft, NTND Extremities:trace RLE edema with chronic skin changes, L BKA Dialysis Access: LU AVF with 2 aneurysms that have thinned skin +b/t  Filed Weights   07/03/22 0504 07/04/22 0510 07/05/22 0718  Weight: 100.9 kg 100.8 kg 100.5 kg    Intake/Output Summary (Last 24 hours) at 07/05/2022 1138 Last data filed at 07/05/2022 0818 Gross per 24 hour  Intake 480 ml  Output --  Net 480 ml    Additional Objective Labs: Basic Metabolic Panel: Recent Labs  Lab 06/30/22 0611 07/02/22 1036 07/05/22 0921  NA 132* 131* 130*  K 4.9 4.7 4.6  CL 93* 94* 94*  CO2 24 23 24   GLUCOSE 149* 84 157*  BUN 79* 71* 87*  CREATININE 8.38* 7.94* 9.45*  CALCIUM 9.4 9.5 9.3  PHOS 5.2* 4.8* 4.2   Liver Function Tests: Recent Labs  Lab 06/30/22 0611 07/02/22 1036 07/05/22 0921  ALBUMIN 2.3* 2.6* 2.4*   CBC: Recent Labs  Lab 06/30/22 0611 07/02/22 1036 07/05/22 0921  WBC 7.3 8.6 7.8  HGB 8.4* 8.7* 8.3*  HCT 26.8* 29.3* 27.8*  MCV 89.0 91.8 91.4  PLT 199 230 228   CBG: Recent Labs  Lab 07/04/22 0611 07/04/22 1209 07/04/22 1647 07/04/22 2110 07/05/22 0557  GLUCAP 208* 141* 175* 175* 139*   Medications:   (feeding supplement) PROSource Plus  30 mL Oral BID BM   vitamin C  1,000 mg Oral Daily   atorvastatin  40 mg Oral Daily   calcitRIOL  1.25 mcg Oral Q M,W,F-HD   carvedilol  25 mg Oral BID WC    darbepoetin (ARANESP) injection - DIALYSIS  200 mcg Subcutaneous Q Tue-1800   diclofenac Sodium  2 g Topical TID AC   gabapentin  200 mg Oral QHS   Gerhardt's butt cream   Topical BID   guaiFENesin  600 mg Oral BID   heparin  5,000 Units Subcutaneous Q8H   insulin aspart  0-5 Units Subcutaneous QHS   insulin aspart  0-9 Units Subcutaneous TID with meals   insulin aspart  4 Units Subcutaneous TID WC   insulin glargine-yfgn  12 Units Subcutaneous Daily   And   insulin glargine-yfgn  4 Units Subcutaneous Daily   irbesartan  75 mg Oral Daily   lactobacillus  1 g Oral TID WC   melatonin  3 mg Oral QHS   multivitamin  1 tablet Oral QHS   pantoprazole  40 mg Oral Daily   polycarbophil  625 mg Oral BID   sucroferric oxyhydroxide  1,000 mg Oral TID WC   torsemide  100 mg Oral Once per day on Sun Tue Thu Sat    Dialysis Orders: [facility] MWF 4h 16min  400/800   108.5kg   2/2 bath  LFA AVF   - No  Heparin  - rocaltrol 1.25 mcg po tiw - mircera 225 mcg IV  q 2wks, last 2/12, due 2/26 - last Hb 8.6 on 2/14   Assessment/Plan: Leukocytosis/L foot 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 On Augmentin.  Hyponatremia - worsening in the setting of hypervolemia. managing with HD/UF. Fluid restriction reinforced. Resolved, Na+ 130.  ESRD - on HD MWF. HD 07/05/2022 per regular schedule.  HTN/ volume - Getting under edw, continues to have edema, UF as tolerated.  EDW should be lower on d/c post BKA, last post wt 100.8kg. BP at goal today. Recently lowered irbesartan 75 mg PO daily started on 06/16/22 to facilitate volume removal. Weight appears more believable now. Continue to UF as tolerated, optimize volume with HD.  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 On Aranesp 238mcg qTues starting 06/22/22. Check iron panel when he completes ABX.  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 Cont po vdra and velphoro as binder.  DM2 - Chronic hyperglycemia, poorly controlled DMT2. PMD adjusting meds.  Nutrition - hypoalbuminemia-  Continue protein supplement. 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  Will consult [facility]VS to evaluate.  Jen Mow, PA-C [facility] 07/05/2022,11:38 AM  LOS: 20 days

## 2022-07-05 NOTE — Progress Notes (Signed)
Patient ID: Scott Castillo, male   DOB: 1960/08/10, 62 y.o.   MRN: [id number]  Met with pt to give update regarding bed offers the only offer he has is [facility] for Thursday. He is in agreement with this and have updated [facility] navigator and treatment team of plan moving forward. Will work toward transfer for Thursday.

## 2022-07-05 NOTE — Progress Notes (Signed)
Physical Therapy Session Note  Patient Details  Name: Scott Castillo MRN: [id number] Date of Birth: 1960-10-27  Today's Date: 07/05/2022 PT Individual Time: 1030-1125 PT Individual Time Calculation (min): 55 min   Short Term Goals: Week 3:  PT Short Term Goal 1 (Week 3): STG's=LTG's  Skilled Therapeutic Interventions/Progress Updates:    Chart reviewed and pt agreeable to therapy. Pt received seated in WC with no c/o pain. Also of note, pt was direct hand off from OT. Session focused on LE strength training to promote eventual prosthetic use and WC management. Pt initiated session with ind WC propulsion for 140ft to therapy gym. Pt then transferred to mat table using supervision. Pt then completed supine hip flex stretch for 3x30sec, then hip ab and ext for 3x10 on side. Pt then had nose bleed which required management. Once controlled, pt made 3 attempts for sit to stand, but was limited by fatigue. Pt then completed 3x 34ft ramp WC propulsion with supervision. Pt again required rest. Pt then returned to room with ind WC propulsion. At end of session, pt was left seated in [facility] with alarm engaged, nurse call bell and all needs in reach.     Therapy Documentation Precautions:  Precautions Precautions: Fall Precaution Comments: L BKA Other Brace: L limb protector Restrictions Weight Bearing Restrictions: Yes LLE Weight Bearing: Non weight bearing General:       Therapy/Group: Individual Therapy  Marquette Old, PT, DPT 07/05/2022, 12:07 PM

## 2022-07-06 LAB — GLUCOSE, CAPILLARY
Glucose-Capillary: 92 mg/dL (ref 70–99)
Glucose-Capillary: 133 mg/dL — ABNORMAL HIGH (ref 70–99)
Glucose-Capillary: 127 mg/dL — ABNORMAL HIGH (ref 70–99)
Glucose-Capillary: 123 mg/dL — ABNORMAL HIGH (ref 70–99)
Glucose-Capillary: 156 mg/dL — ABNORMAL HIGH (ref 70–99)

## 2022-07-06 MED ORDER — SODIUM CHLORIDE 0.9 % IV SOLN
250.0000 mg | INTRAVENOUS | Status: DC
  Filled 2022-07-06: qty 20

## 2022-07-06 NOTE — Anesthesia Preprocedure Evaluation (Signed)
Anesthesia Evaluation  Patient identified by MRN, date of birth, ID band Patient awake    Reviewed: Allergy & Precautions, NPO status , Patient's Chart, lab work & pertinent test results  Airway Mallampati: II  TM Distance: >3 FB Neck ROM: Full    Dental  (+) Poor Dentition   Pulmonary neg pulmonary ROS   Pulmonary exam normal        Cardiovascular hypertension, Pt. on medications and Pt. on home beta blockers  Rhythm:Regular Rate:Normal     Neuro/Psych negative neurological ROS  negative psych ROS   GI/Hepatic negative GI ROS, Neg liver ROS,,,  Endo/Other  diabetes, Type 2, Insulin Dependent    Renal/GU ESRFRenal disease  negative genitourinary   Musculoskeletal negative musculoskeletal ROS (+)    Abdominal Normal abdominal exam  (+)   Peds  Hematology  (+) Blood dyscrasia, anemia   Anesthesia Other Findings   Reproductive/Obstetrics                             Anesthesia Physical Anesthesia Plan  ASA: 3  Anesthesia Plan: MAC and Regional   Post-op Pain Management: Regional block*   Induction: Intravenous  PONV Risk Score and Plan: 1 and Ondansetron, Dexamethasone, Midazolam, Treatment may vary due to age or medical condition and Propofol infusion  Airway Management Planned: Simple Face Mask and Nasal Cannula  Additional Equipment: None  Intra-op Plan:   Post-operative Plan:   Informed Consent: I have reviewed the patients History and Physical, chart, labs and discussed the procedure including the risks, benefits and alternatives for the proposed anesthesia with the patient or authorized representative who has indicated his/her understanding and acceptance.     Dental advisory given  Plan Discussed with: CRNA  Anesthesia Plan Comments: (Lab Results      Component                Value               Date                      WBC                      7.8                  07/05/2022                HGB                      8.3 (L)             07/05/2022                HCT                      27.8 (L)            07/05/2022                MCV                      91.4                07/05/2022                PLT  228                 07/05/2022           )       Anesthesia Quick Evaluation

## 2022-07-06 NOTE — Progress Notes (Signed)
Occupational Therapy Session Note  Patient Details  Name: Scott Castillo MRN: [id number] Date of Birth: 11/20/1960  Today's Date: 07/06/2022 OT Individual Time: 1300-1400 OT Individual Time Calculation (min): 60 min    Short Term Goals: Week 3:  OT Short Term Goal 1 (Week 3): STG= LTG d/t SNF d/c  Skilled Therapeutic Interventions/Progress Updates:    Patient received seated in wheelchair agreeable to OT intervention.  Patient indicated it would be nice to shower as he has surgery tomorrow and is leaving Thursday.  Patient able to direct this therapist - new to patient- in effective process to bathe and dress self.  Patient wheeled self into bathroom, removed clothing - assistance to remove incontinence brief.  Patient very safety conscious throughout session.  Transferred to/ from shower seat with set up assist (and assist to maintain position of wheelchair.  Able to wash his bottom with lateral leaning.  Able to effectively use grab bar to transition to stand to aide with dressing.  Able to complete skin check of residual limb with inspection mirror.  Patient able to don shrinker sock and limb guard without assistance.  Assist with wheelchair set up.  Patient very pleasant throughout session.  All questions asked and answered.  Patient pleased with his progress and thankful for the care he has received thus far.  Patient left up in wheelchair - needs met - personal items in reach.   Therapy Documentation Precautions:  Precautions Precautions: Fall Precaution Comments: L BKA Other Brace: L limb protector Restrictions Weight Bearing Restrictions: Yes LLE Weight Bearing: Non weight bearing General: General OT Amount of Missed Time: 15 Minutes   Pain: Denies pain  Therapy/Group: Individual Therapy  Mariah Milling 07/06/2022, 2:14 PM

## 2022-07-06 NOTE — Progress Notes (Signed)
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  He has 2 tandem aneurysms but the more concerning aneurysm is more proximal.  I discussed AV fistula revision with possible [facility] tomorrow in OR.  May do this in stages to avoid catheter.  He is amendable to proceed.  Please keep n.p.o. after midnight.  Consent order placed.  Marty Heck 07/06/2022 6:34 AM --

## 2022-07-06 NOTE — Progress Notes (Signed)
Physical Therapy Discharge Summary  Patient Details  Name: Scott Castillo MRN: [id number] Date of Birth: 06-13-1960  Date of Discharge from PT service:July 06, 2022     Patient has met 5 of 6 long term goals due to improved activity tolerance, improved balance, increased strength, decreased pain, and ability to compensate for deficits.  Pt has yet to participate in ambulation activities as the amb goal was discontinued since pt sill d/c at ambulation level. Patient to discharge at a wheelchair level Modified Independent.   Patient's care partner is independent to provide the necessary physical assistance at discharge.  Reasons goals not met: Pt has yet to participate in ambulation activities as the amb goal was discontinued since pt sill d/c at ambulation level.   Recommendation:  Patient will benefit from ongoing skilled PT services in skilled nursing facility setting to continue to advance safe functional mobility, address ongoing impairments in strength, endurance, functional mobility, and minimize fall risk noted below.  Equipment: No equipment provided as pt is transferring to SNF  Reasons for discharge:  SNF bed opening  Patient/family agrees with progress made and goals achieved: Yes  PT Discharge Precautions/Restrictions Precautions Precautions: Fall Precaution Comments: L BKA Other Brace: L limb protector Restrictions Weight Bearing Restrictions: Yes LLE Weight Bearing: Non weight bearing Vital Signs   Pain Pain Assessment Pain Score: 3  Pain Type: Surgical pain Pain Location: Leg Pain Orientation: Left Pain Interference Pain Interference Pain Effect on Sleep: 1. Rarely or not at all Pain Interference with Therapy Activities: 1. Rarely or not at all Pain Interference with Day-to-Day Activities: 1. Rarely or not at all Vision/Perception  Vision - History Ability to See in Adequate Light: 0 Adequate Perception Perception: Within Functional  Limits Praxis Praxis: Intact  Cognition Overall Cognitive Status: Within Functional Limits for tasks assessed Arousal/Alertness: Awake/alert Orientation Level: Oriented X4 Sensation Sensation Light Touch: Impaired Detail Coordination Gross Motor Movements are Fluid and Coordinated: No Fine Motor Movements are Fluid and Coordinated: No Motor  Motor Motor: Other (comment) Motor - Discharge Observations: decreased coordination/strength in B hands, L BKA. Below baseline but improved conditioning from eval  Mobility Bed Mobility Bed Mobility: Rolling Right;Rolling Left;Supine to Sit;Sit to Supine Rolling Right: Independent Rolling Left: Independent Supine to Sit: Independent Sit to Supine: Independent Transfers Transfers: Sit to Stand;Stand to Sit;Squat Pivot Transfers;Lateral/Scoot Transfers Sit to Stand: Minimal Assistance - Patient > 75% Stand to Sit: Minimal Assistance - Patient > 75% Squat Pivot Transfers: Independent with assistive device Lateral/Scoot Transfers: Independent Transfer (Assistive device): Other (Comment) (can be performed with slideboard) Locomotion  Gait Ambulation: No Gait Gait: No Stairs / Additional Locomotion Stairs: No Ramp: Independent with assistive device (w/c level) Pick up small object from the floor assist level: Independent Pick up small object from the floor assistive device: Pt at w/c level Wheelchair Mobility Wheelchair Mobility: Yes Wheelchair Assistance: Chartered loss adjuster: Both upper extremities Wheelchair Parts Management: Needs assistance Distance: >150  Trunk/Postural Assessment  Cervical Assessment Cervical Assessment: Within Functional Limits Thoracic Assessment Thoracic Assessment: Within Functional Limits Lumbar Assessment Lumbar Assessment: Within Functional Limits Postural Control Postural Control: Deficits on evaluation Righting Reactions: L BKA  Balance Balance Balance Assessed:  Yes Static Sitting Balance Static Sitting - Balance Support: Bilateral upper extremity supported Static Sitting - Level of Assistance: 7: Independent Dynamic Sitting Balance Dynamic Sitting - Balance Support: Bilateral upper extremity supported Dynamic Sitting - Level of Assistance: 7: Independent Static Standing Balance Static Standing - Balance Support: Bilateral upper extremity  supported Static Standing - Level of Assistance: 5: Stand by assistance Dynamic Standing Balance Dynamic Standing - Balance Support: Bilateral upper extremity supported;During functional activity Dynamic Standing - Level of Assistance: 4: Min assist Extremity Assessment      RLE Assessment RLE Assessment: Exceptions to [facility] RLE Strength Right Hip Flexion: 5/5 Right Hip Extension: 4/5 Right Hip ADduction: 5/5 Right Knee Flexion: 4+/5 Right Knee Extension: 4/5 Right Ankle Dorsiflexion: 3/5 LLE Assessment LLE Assessment: Exceptions to [facility] Strength Comments: L BKA grossly 4-/5   Rosita DeChalus 07/06/2022, 9:56 AM

## 2022-07-06 NOTE — Progress Notes (Signed)
Occupational Therapy Session Note  Patient Details  Name: Scott Castillo MRN: [id number] Date of Birth: 26-May-1960  Today's Date: 07/06/2022 OT Individual Time: 1120-1200 OT Individual Time Calculation (min): 40 min    Short Term Goals: Week 3:  OT Short Term Goal 1 (Week 3): STG= LTG d/t SNF d/c  Skilled Therapeutic Interventions/Progress Updates:    Pt received siting up with no c/o pain, agreeable to OT session. He was guided through changing of shrinker, as well as gentle desensitization and cleaning. He propelled his w/c to the therapy gym at mod I level, great improvement in pace. He completed standing activity at the BITS to address standing tolerance and balance with only unilateral support on the RW. He completed 3 trials of 2 minutes with prolonged rest break between each set. He required fluctuating min-mod A in standing, worsening as he fatigued. He ended with 2x wheelchair pushups with min cueing for technique and body mechanics. Intended carryover to sit > stand for ADL transfers. He returned to his room and was left sitting up with all needs met.   Therapy Documentation Precautions:  Precautions Precautions: Fall Precaution Comments: L BKA Other Brace: L limb protector Restrictions Weight Bearing Restrictions: Yes LLE Weight Bearing: Non weight bearing   Therapy/Group: Individual Therapy  Curtis Sites 07/06/2022, 6:16 AM

## 2022-07-06 NOTE — Progress Notes (Signed)
Physical Therapy Session Note  Patient Details  Name: Scott Castillo MRN: [id number] Date of Birth: [birth year]  Today's Date: 07/06/2022 PT Individual Time: 0850-1000 PT Individual Time Calculation (min): 70 min   Short Term Goals: Week 3:  PT Short Term Goal 1 (Week 3): STG's=LTG's  Skilled Therapeutic Interventions/Progress Updates: Pt presented in w/c agreeable to therapy. Pt c/o mild pain where x1 staple was removed. Pt explained that he will have fistula revision tomorrow as well as HD therefore this therapist anticipate this will be last day of therapies. Pt therefore participated in functional mobility activities in preparation for d/c to SNF. Pt propelled to ortho gym with supervision and without rest breaks. Pt was able to set up slideboard and perform transfer mod I. Pt was also able to release w/c parts without assist and increased time. During session pt did require some assist to place leg rests and amputee pad. Pt returned to w/c via squat pivot at mod I level. Pt participated in ascending/descending ramp and performing car transfer. Pt returned to mat and participated in sitting and standing balance activities including reaching tasks and standing horseshoes. Discussed with pt w/c size recommendations as well as provided pt with extra co-band to help with gripping items at SNF. At end of session pt propelled back to room and remained in w/c at end of session with call bell within reach and needs met.      Therapy Documentation Precautions:  Precautions Precautions: Fall Precaution Comments: L BKA Other Brace: L limb protector Restrictions Weight Bearing Restrictions: Yes LLE Weight Bearing: Non weight bearing General:   Vital Signs:   Pain: Pain Assessment Pain Score: 3  Pain Type: Surgical pain Pain Location: Leg Pain Orientation: Left Mobility: Bed Mobility Bed Mobility: Rolling Right;Rolling Left;Supine to Sit;Sit to Supine Rolling Right: Independent Rolling  Left: Independent Supine to Sit: Independent Sit to Supine: Independent Transfers Transfers: Sit to Stand;Stand to Sit;Squat Pivot Transfers;Lateral/Scoot Transfers Sit to Stand: Minimal Assistance - Patient > 75% Stand to Sit: Minimal Assistance - Patient > 75% Squat Pivot Transfers: Independent with assistive device Lateral/Scoot Transfers: Independent Transfer (Assistive device): Other (Comment) (can be performed with slideboard) Locomotion : Gait Ambulation: No Gait Gait: No Stairs / Additional Locomotion Stairs: No Ramp: Independent with assistive device (w/c level) Wheelchair Mobility Wheelchair Mobility: Yes Wheelchair Assistance: Chartered loss adjuster: Both upper extremities Wheelchair Parts Management: Needs assistance Distance: >150  Trunk/Postural Assessment : Cervical Assessment Cervical Assessment: Within Functional Limits Thoracic Assessment Thoracic Assessment: Within Functional Limits Lumbar Assessment Lumbar Assessment: Within Functional Limits Postural Control Postural Control: Deficits on evaluation Righting Reactions: L BKA  Balance: Balance Balance Assessed: Yes Static Sitting Balance Static Sitting - Balance Support: Bilateral upper extremity supported Static Sitting - Level of Assistance: 7: Independent Dynamic Sitting Balance Dynamic Sitting - Balance Support: Bilateral upper extremity supported Dynamic Sitting - Level of Assistance: 7: Independent Static Standing Balance Static Standing - Balance Support: Bilateral upper extremity supported Static Standing - Level of Assistance: 5: Stand by assistance Dynamic Standing Balance Dynamic Standing - Balance Support: Bilateral upper extremity supported;During functional activity Dynamic Standing - Level of Assistance: 4: Min assist    Therapy/Group: Individual Therapy  Ryenne Lynam 07/06/2022, 10:41 AM

## 2022-07-06 NOTE — Progress Notes (Signed)
PROGRESS NOTE   Subjective/Complaints: Pt has vascular surgery procedure scheduled for tomorrow. He will be NPO after midnight.  No additional concerns this AM.   ROS: + LLE pain - improving. + Decreased energy often related to dialysis patient denies fever, rash, nausea, vomiting, diarrhea, abdominal pain, shortness of breath or chest pain,   headache, or mood change.   Objective:   No results found.    Recent Labs    07/05/22 0921  WBC 7.8  HGB 8.3*  HCT 27.8*  PLT 228    Recent Labs    07/05/22 0921  NA 130*  K 4.6  CL 94*  CO2 24  GLUCOSE 157*  BUN 87*  CREATININE 9.45*  CALCIUM 9.3     Intake/Output Summary (Last 24 hours) at 07/06/2022 [id number] Last data filed at 07/06/2022 0600 Gross per 24 hour  Intake 320 ml  Output 3500 ml  Net -3180 ml      Pressure Injury 06/27/22 Heel Right Deep Tissue Pressure Injury - Purple or maroon localized area of discolored intact skin or blood-filled blister due to damage of underlying soft tissue from pressure and/or shear. (Active)  06/27/22 2125  Location: Heel  Location Orientation: Right  Staging: Deep Tissue Pressure Injury - Purple or maroon localized area of discolored intact skin or blood-filled blister due to damage of underlying soft tissue from pressure and/or shear.  Wound Description (Comments):   Present on Admission: No    Physical Exam: Vital Signs Blood pressure 139/84, pulse 78, temperature 97.8 F (36.6 C), resp. rate 18, height 6' (1.829 m), weight 102 kg, SpO2 97 %.   Constitutional: No distress . Vital signs reviewed. Working with therapy in gym HEENT: NCAT, conjugate gaze Neck: supple Cardiovascular: RRR without murmur. No JVD    Respiratory/Chest: CTA Bilaterally without wheezes or rales. Good air movement GI/Abdomen: BS +, non-tender, non-distended Ext: no clubbing, cyanosis, or edema AVF LUE with anneurysms Psych: pleasant and  cooperative  Skin: R heel wound, scattered abrasions proximally.    Neuro:    Alert and awake, CN 2-12 grossly intact, follows commands    Comments: Decreased to light touch from R mid shin downwards;  Musculoskeletal:   Moving all 4 limbs antigravity and against resistance.   UE strength 5/5 in Biceps, triceps, WE, grip and FA B/L. Strength symmetrical and unchanged in BUE. Subjective sensory loss LUE? RLE- 5/5 in HF, KE, KF- but moderate foot drop- DF 1/5 and PF 1/5 LLE- 4/5 HF, KE--no change     Assessment/Plan: 1. Functional deficits which require 3+ hours per day of interdisciplinary therapy in a comprehensive inpatient rehab setting. Physiatrist is providing close team supervision and 24 hour management of active medical problems listed below. Physiatrist and rehab team continue to assess barriers to discharge/monitor patient progress toward functional and medical goals  Care Tool:  Bathing    Body parts bathed by patient: Right arm, Left arm, Abdomen, Chest, Front perineal area, Right upper leg, Left upper leg, Face, Buttocks, Right lower leg   Body parts bathed by helper: Buttocks, Right lower leg Body parts n/a: Left lower leg   Bathing assist Assist Level: Contact Guard/Touching assist (shower  level seated)     Upper Body Dressing/Undressing Upper body dressing   What is the patient wearing?: Pull over shirt    Upper body assist Assist Level: Set up assist    Lower Body Dressing/Undressing Lower body dressing      What is the patient wearing?: Pants, Incontinence brief     Lower body assist Assist for lower body dressing: Moderate Assistance - Patient 50 - 74%     Toileting Toileting    Toileting assist Assist for toileting: Moderate Assistance - Patient 50 - 74%     Transfers Chair/bed transfer  Transfers assist     Chair/bed transfer assist level: Supervision/Verbal cueing     Locomotion Ambulation   Ambulation assist   Ambulation  activity did not occur: Safety/medical concerns (Unable to ambulated at this time secondary to new BKA with global deconditioning)          Walk 10 feet activity   Assist  Walk 10 feet activity did not occur: Safety/medical concerns        Walk 50 feet activity   Assist Walk 50 feet with 2 turns activity did not occur: Safety/medical concerns         Walk 150 feet activity   Assist Walk 150 feet activity did not occur: Safety/medical concerns         Walk 10 feet on uneven surface  activity   Assist Walk 10 feet on uneven surfaces activity did not occur: Safety/medical concerns         Wheelchair     Assist Is the patient using a wheelchair?: Yes Type of Wheelchair: Manual    Wheelchair assist level: Supervision/Verbal cueing Max wheelchair distance: 150    Wheelchair 50 feet with 2 turns activity    Assist        Assist Level: Supervision/Verbal cueing   Wheelchair 150 feet activity     Assist      Assist Level: Supervision/Verbal cueing   Blood pressure 139/84, pulse 78, temperature 97.8 F (36.6 C), resp. rate 18, height 6' (1.829 m), weight 102 kg, SpO2 97 %.  Medical Problem List and Plan: 1. 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  -Continue CIR therapies including PT, OT    -DC to SNF thursday     2.  Antithrombotics: -DVT/anticoagulation:  Pharmaceutical: Heparin             -antiplatelet therapy: NA 3. Pain Management: Oxycodone prn for pain. No phantom pain so far  -muscle rub for R thigh started- pain improved  3/12 gabapentin 200mg  qhs helpful for LUE, no SE  3/13 pain controlled-doing well  4. Mood/Behavior/Sleep: LCSW to follow for evaluation and support.              -antipsychotic agents: N/A  -3/8 Melatonin 3mg  HS started  5.  Neuropsych/cognition: This patient is capable of making decisions on his own behalf. 6. Skin/Wound Care: Routine pressure relief measures.   -ordered smaller 2X shrinkers per [employer] which pt is now wearing  -staples out- wear shrinker directly over leg without dressing.    7. Fluids/Electrolytes/Nutrition: Strict I/O. Daily weights. Enforce 1200 cc FR.             --  Reports on multiple supplements--d/c nephro.  --Continue Juven bid, Zinc and Vitamin-C to promote wound healing.  -Hyponatremia- improved on labs 3/4 to 131 8. Left calf staph positive cultures: IV antibiotics changed to Augmentin X 19 days per recs.  (stop 07/03/22) 9. HTN: Monitor BP TID--continue Avapro (reduced to 75mg  2/27) and Demadex -STTS.   -3/7 irbesartan was previously decreased to allow for more volume to be reduced by nephrology, continue to monitor, BP mildy elevated but stable overall, hesitant to increase BP medications due to dialysis, if BP increases further may need increase irbesartan, Add hydralazine PRN SBP > 180  -3/15 BP with fair to borderlinecontrol, continue current regimen, parameters placed for PT/OT - 3/19 fair control overall, continue current regimen      07/06/2022    5:26 AM 07/06/2022    5:00 AM 07/05/2022    7:26 PM  Vitals with BMI  Weight  224 lbs 14 oz   BMI  [id number]   Systolic [id number]  0000000  Diastolic 84  92  Pulse 78  78    10. T2DM: Hgb A1C- 7.4. Was on Glimepiride and Rubelsus PTA.  -- Monitor BS ac/hs and use SSI for tighter control.         CBG (last 3)  Recent Labs    07/05/22 2100 07/05/22 2307 07/06/22 0631  GLUCAP 158* 167* 92    3/12 increased am semglee to 12u  3/14-15 cbg's leveling out. Continue semglee 12u qam and 4u qpm   -continue scheduled novolog 4u tid ac  3/17: Blood sugars mildly elevated this a.m., monitor 1 to 2 days for trend  3/18 CBGS appear improved today, continue to monitor   11. ESRD: Volume being managed with HD MWF. Is basically anuric- voided x2  since admission. Via fistula --  HD at the end of the day to help with tolerance of therapy.   -3/18 Revision fo Left AVF fistula planned for Wednesday by vascular 12.  Leucocytosis due to LLE osteomyelitis- : Monitor for fevers and other signs of infection.              --augmentin complete 3/16   -3/18 WBC WNL at 7.8 13. Hyponatremia: Being managed with HD.  14. Cough: resolved  15. Right hip pain: Fall 3 weeks PTA. Will order Voltaren gel TID.  16. Loose stools- due to IV ABX per pt- might need a stool bulker/fiber-   -3/1 Fibercon started 625mg  BID  -3/17-large BM this a.m.   LOS: 21 days A FACE TO FACE EVALUATION WAS PERFORMED  Jennye Boroughs 07/06/2022, 8:28 AM

## 2022-07-06 NOTE — Progress Notes (Incomplete)
Physical Therapy Session Note  Patient Details  Name: Scott Castillo MRN: [id number] Date of Birth: Mar 29, 1961  Today's Date: 07/06/2022 PT Individual Time:  -      Short Term Goals: [id number]  Skilled Therapeutic Interventions/Progress Updates:      Therapy Documentation Precautions:  Precautions Precautions: Fall Precaution Comments: L BKA Other Brace: L limb protector Restrictions Weight Bearing Restrictions: Yes LLE Weight Bearing: Non weight bearing General:   Pain:      Therapy/Group: {Therapy/Group:[id number]}  [name] [name] 07/06/2022, 7:48 AM

## 2022-07-06 NOTE — Progress Notes (Signed)
Occupational Therapy Discharge Summary  Patient Details  Name: Scott Castillo MRN: [id number] Date of Birth: 10/23/1960  Date of Discharge from [facility] service:July 05, 2022   Patient has met 5 of 5 long term goals due to improved activity tolerance, improved balance, postural control, ability to compensate for deficits, and improved coordination.  Patient to discharge at [facility] Assist level.  Patient's care partner is independent to provide the necessary physical assistance at discharge. Scott Castillo has made great progress in CIR but remains limited in standing and functional mobility. He is able to complete squat pivot transfer at mod I level, requiring min A for standing and mod A for stand pivots. He is highly motivated and remains a great candidate for continued rehab efforts.   Recommendation:  Patient will benefit from ongoing skilled OT services in skilled nursing facility setting to continue to advance functional skills in the area of BADL and iADL.  Equipment: No equipment provided  Reasons for discharge: treatment goals met and discharge from hospital  Patient/family agrees with progress made and goals achieved: Yes  OT Discharge Precautions/Restrictions  Precautions Precautions: Fall Precaution Comments: L BKA Other Brace: L limb protector Restrictions Weight Bearing Restrictions: Yes LLE Weight Bearing: Non weight bearing  Pain Pain Assessment Pain Score: 3  Pain Type: Surgical pain Pain Location: Leg Pain Orientation: Left ADL ADL Eating: Set up Where Assessed-Eating: Chair Grooming: Setup Where Assessed-Grooming: Chair Upper Body Bathing: Modified independent Where Assessed-Upper Body Bathing: Shower Lower Body Bathing: Contact guard Where Assessed-Lower Body Bathing: Shower Upper Body Dressing: Supervision/safety Where Assessed-Upper Body Dressing: Wheelchair Lower Body Dressing: Supervision/safety Where Assessed-Lower Body Dressing: Edge of  bed Toileting: Contact guard Where Assessed-Toileting: Glass blower/designer: Therapist, music Method: Squat pivot Tub/Shower Transfer: Metallurgist Method: Health visitor with back Vision Baseline Vision/History: 1 Wears glasses Patient Visual Report: No change from baseline Vision Assessment?: No apparent visual deficits Perception  Perception: Within Functional Limits Praxis Praxis: Intact Cognition Cognition Overall Cognitive Status: Within Functional Limits for tasks assessed Arousal/Alertness: Awake/alert Orientation Level: Person;Place;Situation Person: Oriented Place: Oriented Situation: Oriented Memory: Impaired Memory Impairment: Decreased recall of new information Selective Attention: Appears intact Awareness: Appears intact Problem Solving: Appears intact Safety/Judgment: Appears intact Brief Interview for Mental Status (BIMS) Repetition of Three Words (First Attempt): 3 Temporal Orientation: Year: Correct Temporal Orientation: Month: Accurate within 5 days Temporal Orientation: Day: Correct Recall: "Sock": Yes, after cueing ("something to wear") Recall: "Blue": Yes, no cue required Recall: "Bed": Yes, no cue required BIMS Summary Score: 14 Sensation Sensation Light Touch: Impaired Detail Central sensation comments: Absent sensation at the R foot, distal to the ankle. 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 Below baseline but improved conditioning from  eval Mobility  Bed Mobility Bed Mobility: Rolling Right;Rolling Left;Supine to Sit;Sit to Supine Rolling Right: Independent Rolling Left: Independent Supine to Sit: Independent Sit to Supine: Independent Transfers Sit to Stand: Minimal Assistance - Patient > 75% Stand to Sit: Minimal Assistance - Patient > 75%  Trunk/Postural Assessment  Cervical Assessment Cervical Assessment: Within Functional Limits Thoracic Assessment Thoracic Assessment: Within Functional Limits Lumbar Assessment Lumbar  Assessment: Within Functional Limits Postural Control Postural Control: Deficits on evaluation Righting Reactions: L BKA  Balance Balance Balance Assessed: Yes Static Sitting Balance Static Sitting - Balance Support: Bilateral upper extremity supported Static Sitting - Level of Assistance: 7: Independent Dynamic Sitting Balance Dynamic Sitting - Balance Support: Bilateral upper extremity supported Dynamic Sitting - Level of Assistance: 7: Independent Static Standing Balance Static Standing - Balance Support: Bilateral upper extremity supported Static Standing - Level of Assistance: 5: Stand by assistance Dynamic Standing Balance Dynamic Standing - Balance Support: Bilateral upper extremity supported;During functional activity Dynamic Standing - Level of Assistance: 4: Min assist Extremity/Trunk Assessment RUE Assessment RUE Assessment: Exceptions to [facility] Active Range of Motion (AROM) Comments: AROM WFL General Strength Comments: 4- grossly at the shoulders and elbow. 3-/5 at the hands grossly with pt reported cervical myopathy changes. Hands appear arthritic. LUE Assessment LUE Assessment: Exceptions to [facility] General Strength Comments: 4- grossly at the shoulders and elbow. 3-/5 at the hands grossly with pt reported cervical myopathy changes. Hands appear arthritic.   Curtis Sites 07/06/2022, 11:48 AM

## 2022-07-06 NOTE — Progress Notes (Addendum)
[facility] Progress Note   Subjective:   Patient seen and examined in room.  Sitting in wheelchair.  No specific complaints.  Plans for revision of AVF tomorrow. Denies CP, SOB, abdominal pain and n/v/d.   Objective Vitals:   07/05/22 1841 07/05/22 1926 07/06/22 0500 07/06/22 0526  BP:  (!) 153/92  139/84  Pulse:  78  78  Resp:  18  18  Temp:  (!) 97.5 F (36.4 C)  97.8 F (36.6 C)  TempSrc:      SpO2:  97%  97%  Weight: 104 kg  102 kg   Height:       Physical Exam General:chronically ill appearing male in NAD Heart:RRR, no mrg Lungs:CTAB, nml WOB on RA Abdomen:soft, NTND Extremities:trace RLE edema, L BKA Dialysis Access: LU AVF w/2 aneurysms w/thinning skin   Filed Weights   07/05/22 1823 07/05/22 1841 07/06/22 0500  Weight: 103 kg 104 kg 102 kg    Intake/Output Summary (Last 24 hours) at 07/06/2022 1114 Last data filed at 07/06/2022 0839 Gross per 24 hour  Intake 678 ml  Output 3500 ml  Net -2822 ml    Additional Objective Labs: Basic Metabolic Panel: Recent Labs  Lab 06/30/22 0611 07/02/22 1036 07/05/22 0921  NA 132* 131* 130*  K 4.9 4.7 4.6  CL 93* 94* 94*  CO2 24 23 24   GLUCOSE 149* 84 157*  BUN 79* 71* 87*  CREATININE 8.38* 7.94* 9.45*  CALCIUM 9.4 9.5 9.3  PHOS 5.2* 4.8* 4.2   Liver Function Tests: Recent Labs  Lab 06/30/22 0611 07/02/22 1036 07/05/22 0921  ALBUMIN 2.3* 2.6* 2.4*   CBC: Recent Labs  Lab 06/30/22 0611 07/02/22 1036 07/05/22 0921  WBC 7.3 8.6 7.8  HGB 8.4* 8.7* 8.3*  HCT 26.8* 29.3* 27.8*  MCV 89.0 91.8 91.4  PLT 199 230 228   CBG: Recent Labs  Lab 07/05/22 0557 07/05/22 1218 07/05/22 2100 07/05/22 2307 07/06/22 0631  GLUCAP 139* 109* 158* 167* 92   Iron Studies:  Recent Labs    07/05/22 1527  IRON 30*  TIBC 218*    Medications:   (feeding supplement) PROSource Plus  30 mL Oral BID BM   vitamin C  1,000 mg Oral Daily   atorvastatin  40 mg Oral Daily   calcitRIOL  1.25 mcg Oral Q  M,W,F-HD   carvedilol  25 mg Oral BID WC   darbepoetin (ARANESP) injection - DIALYSIS  200 mcg Subcutaneous Q Tue-1800   diclofenac Sodium  2 g Topical TID AC   gabapentin  200 mg Oral QHS   Gerhardt's butt cream   Topical BID   guaiFENesin  600 mg Oral BID   heparin  5,000 Units Subcutaneous Q8H   insulin aspart  0-5 Units Subcutaneous QHS   insulin aspart  0-9 Units Subcutaneous TID with meals   insulin aspart  4 Units Subcutaneous TID WC   insulin glargine-yfgn  12 Units Subcutaneous Daily   And   insulin glargine-yfgn  4 Units Subcutaneous Daily   irbesartan  75 mg Oral Daily   lactobacillus  1 g Oral TID WC   melatonin  3 mg Oral QHS   multivitamin  1 tablet Oral QHS   pantoprazole  40 mg Oral Daily   polycarbophil  625 mg Oral BID   sucroferric oxyhydroxide  1,000 mg Oral TID WC   torsemide  100 mg Oral Once per day on Sun Tue Thu Sat    Dialysis Orders: [facility] MWF  4h 49min  400/800   108.5kg   2/2 bath  LFA AVF   - No Heparin  - rocaltrol 1.25 mcg po tiw - mircera 225 mcg IV  q 2wks, last 2/12, due 2/26 - last Hb 8.6 on 2/14   Assessment/Plan: Leukocytosis/L foot 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 ABX completed.  Hyponatremia - worsening in the setting of hypervolemia. managing with HD/UF. Fluid restriction reinforced. Resolved, Na+ 130.  ESRD - on HD MWF. HD 07/07/2022 per regular schedule.  HTN/ volume - Getting under edw, continues to have edema, UF as tolerated.  EDW should be lower on d/c post BKA, last post wt 100.8kg. feeling dizzy when below this previously. BP at goal today. Recently lowered irbesartan 75 mg PO daily started on 06/16/22 to facilitate volume removal. Continue to UF as tolerated, optimize volume with HD.  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 tsat 14%, will order iron. On Aranesp 276mcg qTues starting 06/22/22.  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 Cont po vdra and velphoro as binder.  DM2 - Chronic hyperglycemia, poorly controlled DMT2. PMD adjusting  meds.  Nutrition - hypoalbuminemia- Continue protein supplement. 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  To have revision w/VVS tomorrow.  Appreciate their assistance.   Jen Mow, PA-C [facility] 07/06/2022,11:14 AM  LOS: 21 days

## 2022-07-07 ENCOUNTER — Other Ambulatory Visit: Payer: Self-pay

## 2022-07-07 ENCOUNTER — Inpatient Hospital Stay (HOSPITAL_COMMUNITY): Payer: Medicare Other | Admitting: Anesthesiology

## 2022-07-07 ENCOUNTER — Inpatient Hospital Stay (HOSPITAL_COMMUNITY): Payer: Medicare Other

## 2022-07-07 ENCOUNTER — Encounter (HOSPITAL_COMMUNITY)
Admission: RE | Disposition: A | Discharge status: Skilled Nursing Facility | Payer: Self-pay | Source: Intra-hospital | Attending: Physical Medicine & Rehabilitation

## 2022-07-07 DIAGNOSIS — E1122 Type 2 diabetes mellitus with diabetic chronic kidney disease: Secondary | ICD-10-CM

## 2022-07-07 DIAGNOSIS — N189 Chronic kidney disease, unspecified: Secondary | ICD-10-CM

## 2022-07-07 DIAGNOSIS — N185 Chronic kidney disease, stage 5: Secondary | ICD-10-CM

## 2022-07-07 DIAGNOSIS — D631 Anemia in chronic kidney disease: Secondary | ICD-10-CM

## 2022-07-07 DIAGNOSIS — T82898A Other specified complication of vascular prosthetic devices, implants and grafts, initial encounter: Secondary | ICD-10-CM

## 2022-07-07 DIAGNOSIS — I120 Hypertensive chronic kidney disease with stage 5 chronic kidney disease or end stage renal disease: Secondary | ICD-10-CM

## 2022-07-07 DIAGNOSIS — Z794 Long term (current) use of insulin: Secondary | ICD-10-CM

## 2022-07-07 DIAGNOSIS — N186 End stage renal disease: Secondary | ICD-10-CM

## 2022-07-07 HISTORY — PX: INSERTION OF DIALYSIS CATHETER: SHX1324

## 2022-07-07 HISTORY — PX: REVISON OF ARTERIOVENOUS FISTULA: SHX6074

## 2022-07-07 LAB — TYPE AND SCREEN
ABO/RH(D): O POS
Antibody Screen: NEGATIVE

## 2022-07-07 LAB — GLUCOSE, CAPILLARY
Glucose-Capillary: 122 mg/dL — ABNORMAL HIGH (ref 70–99)
Glucose-Capillary: 120 mg/dL — ABNORMAL HIGH (ref 70–99)
Glucose-Capillary: 96 mg/dL (ref 70–99)
Glucose-Capillary: 192 mg/dL — ABNORMAL HIGH (ref 70–99)
Glucose-Capillary: 147 mg/dL — ABNORMAL HIGH (ref 70–99)

## 2022-07-07 SURGERY — REVISON OF ARTERIOVENOUS FISTULA
Anesthesia: Monitor Anesthesia Care | Site: Arm Lower

## 2022-07-07 MED ORDER — CEFAZOLIN SODIUM-DEXTROSE 2-3 GM-%(50ML) IV SOLR
INTRAVENOUS | Status: DC | PRN
  Administered 2022-07-07: 2 g via INTRAVENOUS

## 2022-07-07 MED ORDER — FENTANYL CITRATE (PF) 250 MCG/5ML IJ SOLN
INTRAMUSCULAR | Status: AC
  Filled 2022-07-07: qty 5

## 2022-07-07 MED ORDER — FENTANYL CITRATE (PF) 100 MCG/2ML IJ SOLN: 25.0000 ug | INTRAMUSCULAR | Status: DC | PRN

## 2022-07-07 MED ORDER — LIDOCAINE-EPINEPHRINE (PF) 1.5 %-1:200000 IJ SOLN
INTRAMUSCULAR | Status: DC | PRN
  Administered 2022-07-07: 30 mL via PERINEURAL

## 2022-07-07 MED ORDER — SODIUM CHLORIDE 0.9 % IV SOLN: INTRAVENOUS | Status: DC | PRN

## 2022-07-07 MED ORDER — PHENYLEPHRINE HCL-NACL 20-0.9 MG/250ML-% IV SOLN
INTRAVENOUS | Status: DC | PRN
  Administered 2022-07-07: 30 ug/min via INTRAVENOUS

## 2022-07-07 MED ORDER — ACETAMINOPHEN 10 MG/ML IV SOLN: 1000.0000 mg | Freq: Once | INTRAVENOUS | Status: DC | PRN

## 2022-07-07 MED ORDER — MIDAZOLAM HCL 2 MG/2ML IJ SOLN
INTRAMUSCULAR | Status: AC
  Filled 2022-07-07: qty 2

## 2022-07-07 MED ORDER — HEPARIN 6000 UNIT IRRIGATION SOLUTION
Status: AC
  Filled 2022-07-07: qty 500

## 2022-07-07 MED ORDER — LIDOCAINE-EPINEPHRINE (PF) 1 %-1:200000 IJ SOLN
INTRAMUSCULAR | Status: AC
  Filled 2022-07-07: qty 30

## 2022-07-07 MED ORDER — HEPARIN SODIUM (PORCINE) 1000 UNIT/ML IJ SOLN
INTRAMUSCULAR | Status: AC
  Filled 2022-07-07: qty 10

## 2022-07-07 MED ORDER — MIDAZOLAM HCL 2 MG/2ML IJ SOLN
INTRAMUSCULAR | Status: DC | PRN
  Administered 2022-07-07 (×2): 1 mg via INTRAVENOUS

## 2022-07-07 MED ORDER — HEPARIN SODIUM (PORCINE) 1000 UNIT/ML IJ SOLN
INTRAMUSCULAR | Status: DC | PRN
  Administered 2022-07-07: 5000 [IU] via INTRAVENOUS

## 2022-07-07 MED ORDER — FENTANYL CITRATE (PF) 100 MCG/2ML IJ SOLN
INTRAMUSCULAR | Status: DC | PRN
  Administered 2022-07-07: 25 ug via INTRAVENOUS

## 2022-07-07 MED ORDER — PROPOFOL 10 MG/ML IV BOLUS
INTRAVENOUS | Status: AC
  Filled 2022-07-07: qty 20

## 2022-07-07 MED ORDER — CHLORHEXIDINE GLUCONATE CLOTH 2 % EX PADS: 6.0000 | MEDICATED_PAD | Freq: Every day | CUTANEOUS | Status: DC

## 2022-07-07 MED ORDER — HEPARIN 6000 UNIT IRRIGATION SOLUTION
Status: DC | PRN
  Administered 2022-07-07: 1

## 2022-07-07 MED ORDER — EPHEDRINE SULFATE (PRESSORS) 50 MG/ML IJ SOLN
INTRAMUSCULAR | Status: DC | PRN
  Administered 2022-07-07: 10 mg via INTRAVENOUS

## 2022-07-07 MED ORDER — PROPOFOL 500 MG/50ML IV EMUL
INTRAVENOUS | Status: DC | PRN
  Administered 2022-07-07: 65 ug/kg/min via INTRAVENOUS
  Administered 2022-07-07: 50 ug/kg/min via INTRAVENOUS

## 2022-07-07 MED ORDER — 0.9 % SODIUM CHLORIDE (POUR BTL) OPTIME
TOPICAL | Status: DC | PRN
  Administered 2022-07-07: 1000 mL

## 2022-07-07 MED ORDER — LIDOCAINE HCL (PF) 1 % IJ SOLN
INTRAMUSCULAR | Status: AC
  Filled 2022-07-07: qty 30

## 2022-07-07 SURGICAL SUPPLY — 63 items
ADH SKN CLS APL DERMABOND .7 (GAUZE/BANDAGES/DRESSINGS) ×2
AGENT HMST SPONGE THK3/8 (HEMOSTASIS)
ARMBAND PINK RESTRICT EXTREMIT (MISCELLANEOUS) ×3 IMPLANT
BAG COUNTER SPONGE SURGICOUNT (BAG) ×3 IMPLANT
BAG DECANTER FOR FLEXI CONT (MISCELLANEOUS) ×3 IMPLANT
BAG SPNG CNTER NS LX DISP (BAG) ×2
BIOPATCH RED 1 DISK 7.0 (GAUZE/BANDAGES/DRESSINGS) ×3 IMPLANT
CANISTER SUCT 3000ML PPV (MISCELLANEOUS) ×3 IMPLANT
CATH PALINDROME-P 19CM W/VT (CATHETERS) IMPLANT
CATH PALINDROME-P 23CM W/VT (CATHETERS) IMPLANT
CATH PALINDROME-P 28CM W/VT (CATHETERS) IMPLANT
CATH STRAIGHT 5FR 65CM (CATHETERS) IMPLANT
CLIP TI MEDIUM 6 (CLIP) ×3 IMPLANT
CLIP TI WIDE RED SMALL 6 (CLIP) ×3 IMPLANT
COVER PROBE W GEL 5X96 (DRAPES) ×3 IMPLANT
COVER SURGICAL LIGHT HANDLE (MISCELLANEOUS) ×3 IMPLANT
DERMABOND ADVANCED .7 DNX12 (GAUZE/BANDAGES/DRESSINGS) ×3 IMPLANT
DRAPE C-ARM 42X72 X-RAY (DRAPES) ×3 IMPLANT
DRAPE CHEST BREAST 15X10 FENES (DRAPES) ×3 IMPLANT
ELECT REM PT RETURN 9FT ADLT (ELECTROSURGICAL) ×2
ELECTRODE REM PT RTRN 9FT ADLT (ELECTROSURGICAL) ×3 IMPLANT
GAUZE 4X4 16PLY ~~LOC~~+RFID DBL (SPONGE) ×3 IMPLANT
GLOVE BIO SURGEON STRL SZ7.5 (GLOVE) ×3 IMPLANT
GLOVE BIOGEL PI IND STRL 8 (GLOVE) ×3 IMPLANT
GOWN STRL REUS W/ TWL LRG LVL3 (GOWN DISPOSABLE) ×6 IMPLANT
GOWN STRL REUS W/ TWL XL LVL3 (GOWN DISPOSABLE) ×6 IMPLANT
GOWN STRL REUS W/TWL LRG LVL3 (GOWN DISPOSABLE) ×4
GOWN STRL REUS W/TWL XL LVL3 (GOWN DISPOSABLE) ×4
HEMOSTAT SPONGE AVITENE ULTRA (HEMOSTASIS) IMPLANT
KIT BASIN OR (CUSTOM PROCEDURE TRAY) ×3 IMPLANT
KIT PALINDROME-P 55CM (CATHETERS) IMPLANT
KIT TURNOVER KIT B (KITS) ×3 IMPLANT
NDL 18GX1X1/2 (RX/OR ONLY) (NEEDLE) ×3 IMPLANT
NDL HYPO 25GX1X1/2 BEV (NEEDLE) ×3 IMPLANT
NEEDLE 18GX1X1/2 (RX/OR ONLY) (NEEDLE) ×2 IMPLANT
NEEDLE HYPO 25GX1X1/2 BEV (NEEDLE) ×2 IMPLANT
NS IRRIG 1000ML POUR BTL (IV SOLUTION) ×3 IMPLANT
PACK BASIC III (CUSTOM PROCEDURE TRAY) ×2
PACK CV ACCESS (CUSTOM PROCEDURE TRAY) ×3 IMPLANT
PACK SRG BSC III STRL LF ECLPS (CUSTOM PROCEDURE TRAY) ×3 IMPLANT
PAD ARMBOARD 7.5X6 YLW CONV (MISCELLANEOUS) ×6 IMPLANT
SET MICROPUNCTURE 5F STIFF (MISCELLANEOUS) IMPLANT
SLING ARM FOAM STRAP LRG (SOFTGOODS) IMPLANT
SLING ARM FOAM STRAP MED (SOFTGOODS) IMPLANT
SOAP 2 % CHG 4 OZ (WOUND CARE) ×3 IMPLANT
SPIKE FLUID TRANSFER (MISCELLANEOUS) ×3 IMPLANT
STAPLER VISISTAT 35W (STAPLE) IMPLANT
SUT ETHILON 3 0 PS 1 (SUTURE) ×3 IMPLANT
SUT MNCRL AB 4-0 PS2 18 (SUTURE) ×3 IMPLANT
SUT PROLENE 5 0 C 1 24 (SUTURE) IMPLANT
SUT PROLENE 6 0 BV (SUTURE) IMPLANT
SUT PROLENE 7 0 BV 1 (SUTURE) IMPLANT
SUT VIC AB 3-0 SH 27 (SUTURE) ×2
SUT VIC AB 3-0 SH 27X BRD (SUTURE) ×3 IMPLANT
SYR 10ML LL (SYRINGE) ×3 IMPLANT
SYR 20ML LL LF (SYRINGE) ×6 IMPLANT
SYR 5ML LL (SYRINGE) ×3 IMPLANT
SYR CONTROL 10ML LL (SYRINGE) ×3 IMPLANT
TOWEL GREEN STERILE (TOWEL DISPOSABLE) ×3 IMPLANT
TOWEL GREEN STERILE FF (TOWEL DISPOSABLE) ×3 IMPLANT
UNDERPAD 30X36 HEAVY ABSORB (UNDERPADS AND DIAPERS) ×3 IMPLANT
WATER STERILE IRR 1000ML POUR (IV SOLUTION) ×3 IMPLANT
WIRE AMPLATZ SS-J .035X180CM (WIRE) IMPLANT

## 2022-07-07 NOTE — Progress Notes (Signed)
Transfer report given to Dialysis ,RN, CBG 147  2020  Transported to Dialysis, no pain or discomfort

## 2022-07-07 NOTE — Progress Notes (Signed)
Transfer report called to Sarah Cato,CRNA, with updates. Patient to be transferred by bed, No acute distress or complaint

## 2022-07-07 NOTE — Progress Notes (Signed)
Pre Hemodialysis Tx   07/07/22 2029  Vitals  Temp 98.2 F (36.8 C)  Pulse Rate 72  Resp 12  BP (!) 150/76  SpO2 97 %  O2 Device Room Air  Weight 101.3 kg  Type of Weight Pre-Dialysis  Oxygen Therapy  Pulse Oximetry Type Continuous  Pre Treatment  Vascular access used during treatment Fistula  Patient is receiving dialysis in a chair No  Hemodialysis Consent Verified Yes  Hemodialysis Standing Orders Initiated Yes  Prime Ordered Normal Saline  Length of  DialysisTreatment -hour(s) 3 Hour(s)  Dialysis mode HD  Dialyzer Revaclear 400  Dialysate 2K  Dialysis Anticoagulation Automated NS Flushes  Dialysate Flow Ordered 300  Blood Flow Rate Ordered 400 mL/min  Ultrafiltration Goal 2000 Liters

## 2022-07-07 NOTE — Anesthesia Postprocedure Evaluation (Signed)
Anesthesia Post Note  Patient: Scott Castillo  Procedure(s) Performed: REVISON  OF ARTERIOVENOUS FISTULA LEFT ARM, PLICATION OF ANEURYSM. (Left: Arm Lower) INSERTION OFTUNNELED  DIALYSIS CATHETER     Patient location during evaluation: PACU Anesthesia Type: Regional and MAC Level of consciousness: awake and alert Pain management: pain level controlled Vital Signs Assessment: post-procedure vital signs reviewed and stable Respiratory status: spontaneous breathing, nonlabored ventilation, respiratory function stable and patient connected to nasal cannula oxygen Cardiovascular status: stable and blood pressure returned to baseline Postop Assessment: no apparent nausea or vomiting Anesthetic complications: no   No notable events documented.  Last Vitals:  Vitals:   07/07/22 1046 07/07/22 1327  BP: 118/73 130/77  Pulse: 60 67  Resp: 18 18  Temp: (!) 36.3 C   SpO2: 94% 96%    Last Pain:  Vitals:   07/07/22 1005  TempSrc:   PainSc: 0-No pain                 Belenda Cruise P Rainy Rothman

## 2022-07-07 NOTE — Anesthesia Procedure Notes (Signed)
Anesthesia Regional Block: Supraclavicular block   Pre-Anesthetic Checklist: , timeout performed,  Correct Patient, Correct Site, Correct Laterality,  Correct Procedure, Correct Position, site marked,  Risks and benefits discussed,  Surgical consent,  Pre-op evaluation,  At surgeon's request and post-op pain management  Laterality: Left  Prep: Dura Prep       Needles:  Injection technique: Single-shot  Needle Type: Echogenic Stimulator Needle     Needle Length: 5cm  Needle Gauge: 20     Additional Needles:   Procedures:,,,, ultrasound used (permanent image in chart),,    Narrative:  Start time: 07/07/2022 8:05 AM End time: 07/07/2022 8:11 AM Injection made incrementally with aspirations every 5 mL.  Performed by: Personally  Anesthesiologist: Darral Dash, DO  Additional Notes: Patient identified. Risks/Benefits/Options discussed with patient including but not limited to bleeding, infection, nerve damage, failed block, incomplete pain control. Patient expressed understanding and wished to proceed. All questions were answered. Sterile technique was used throughout the entire procedure. Please see nursing notes for vital signs. Aspirated in 5cc intervals with injection for negative confirmation. Patient was given instructions on fall risk and not to get out of bed. All questions and concerns addressed with instructions to call with any issues or inadequate analgesia.

## 2022-07-07 NOTE — Anesthesia Procedure Notes (Signed)
Procedure Name: MAC Date/Time: 07/07/2022 8:41 AM  Performed by: Leonor Liv, CRNAPre-anesthesia Checklist: Patient identified, Emergency Drugs available, Suction available, Patient being monitored and Timeout performed Patient Re-evaluated:Patient Re-evaluated prior to induction Oxygen Delivery Method: Simple face mask Placement Confirmation: positive ETCO2 Dental Injury: Teeth and Oropharynx as per pre-operative assessment

## 2022-07-07 NOTE — Progress Notes (Signed)
Inpatient Rehabilitation Care Coordinator Discharge Note   Patient Details  Name: Scott Castillo MRN: [id number] Date of Birth: 06/28/1960   Discharge location: [facility]  Length of Stay: 23 days  Discharge activity level: MIN ASSIST LEVEL  Home/community participation: ACTIVE  Patient response [id number] Literacy - How often do you need to have someone help you when you read instructions, pamphlets, or other written material from your doctor or pharmacy?: Rarely  Patient response [id number] Isolation - How often do you feel lonely or isolated from those around you?: Never  Services provided included: MD, RD, PT, OT, RN, CM, TR, Pharmacy, Neuropsych, SW  Financial Services:  Financial Services Utilized: Medicare    Choices offered to/list presented to: PT AND WIFE  Follow-up services arranged:  Other (Comment) (SNF)           Patient response to transportation need: Is the patient able to respond to transportation needs?: Yes In the past 12 months, has lack of transportation kept you from medical appointments or from getting medications?: No In the past 12 months, has lack of transportation kept you from meetings, work, or from getting things needed for daily living?: No    Comments (or additional information): GOING TO SNF TO GET MORE REHAB AND ALLOW TIME FOR RAMP TO BE BUILT. HAD COMPLETED FMLA PAPERWORK FOR WIFE TO TAKE OFF ONE MONTH  Patient/Family verbalized understanding of follow-up arrangements:  Yes  Individual responsible for coordination of the follow-up plan: SELF AND Scott Castillo [phone]  Confirmed correct DME delivered: Elease Hashimoto 07/07/2022    Syona Wroblewski, [place]

## 2022-07-07 NOTE — Progress Notes (Signed)
Spoke with Denese Killings, in [place] for update of patient to leave department Upon entering patient room this morning noted several small kleenexx with blood, patient stays he normal have a nose bleed after waking up and blowing his nose, No active nasal  bleeding noted, continue to monitor Patient anticipating surgery this morning , denies patient,

## 2022-07-07 NOTE — Progress Notes (Signed)
[facility] Progress Note   Subjective:   Patient seen and examined at bedside post procedure.  Revision and aneurysm plication this AM.  Reports it went well and his arm is still numb.  Denies pain, CP, SOB, abdominal pain and n/v/d.    Objective Vitals:   07/07/22 0629 07/07/22 1005 07/07/22 1020 07/07/22 1046  BP:  114/65 121/69 118/73  Pulse:  65 63 60  Resp:  18 19 18   Temp:  (!) 97.3 F (36.3 C) (!) 97.3 F (36.3 C) (!) 97.4 F (36.3 C)  TempSrc:      SpO2:  94% 93% 94%  Weight: 101.3 kg     Height:       Physical Exam General:chronically ill appearing male in NAD Heart:RRR, no mrg Lungs:CTAB, nml WOB on RA Abdomen:soft, NTND Extremities:L BKA, trace RLE edema Dialysis Access: LU AVF +b/t  Filed Weights   07/05/22 1841 07/06/22 0500 07/07/22 0629  Weight: 104 kg 102 kg 101.3 kg    Intake/Output Summary (Last 24 hours) at 07/07/2022 1233 Last data filed at 07/07/2022 0945 Gross per 24 hour  Intake 674 ml  Output 10 ml  Net 664 ml    Additional Objective Labs: Basic Metabolic Panel: Recent Labs  Lab 07/02/22 1036 07/05/22 0921  NA 131* 130*  K 4.7 4.6  CL 94* 94*  CO2 23 24  GLUCOSE 84 157*  BUN 71* 87*  CREATININE 7.94* 9.45*  CALCIUM 9.5 9.3  PHOS 4.8* 4.2   Liver Function Tests: Recent Labs  Lab 07/02/22 1036 07/05/22 0921  ALBUMIN 2.6* 2.4*   CBC: Recent Labs  Lab 07/02/22 1036 07/05/22 0921  WBC 8.6 7.8  HGB 8.7* 8.3*  HCT 29.3* 27.8*  MCV 91.8 91.4  PLT 230 228    Recent Labs  Lab 07/06/22 2016 07/06/22 2244 07/07/22 0627 07/07/22 1007 07/07/22 1112  GLUCAP 123* 156* 122* 120* 96   Iron Studies:  Recent Labs    07/05/22 1527  IRON 30*  TIBC 218*   Lab Results  Component Value Date   INR 1.3 (H) 04/21/2021   INR 1.3 (H) 04/20/2021   INR 1.2 10/07/2020   Studies/Results: No results found.  Medications:  ferric gluconate (FERRLECIT) IVPB      (feeding supplement) PROSource Plus  30 mL Oral  BID BM   vitamin C  1,000 mg Oral Daily   atorvastatin  40 mg Oral Daily   calcitRIOL  1.25 mcg Oral Q M,W,F-HD   carvedilol  25 mg Oral BID WC   Chlorhexidine Gluconate Cloth  6 each Topical Daily   [START ON 07/08/2022] Chlorhexidine Gluconate Cloth  6 each Topical Q0600   darbepoetin (ARANESP) injection - DIALYSIS  200 mcg Subcutaneous Q Tue-1800   diclofenac Sodium  2 g Topical TID AC   gabapentin  200 mg Oral QHS   Gerhardt's butt cream   Topical BID   guaiFENesin  600 mg Oral BID   heparin  5,000 Units Subcutaneous Q8H   insulin aspart  0-5 Units Subcutaneous QHS   insulin aspart  0-9 Units Subcutaneous TID with meals   insulin aspart  4 Units Subcutaneous TID WC   insulin glargine-yfgn  12 Units Subcutaneous Daily   And   insulin glargine-yfgn  4 Units Subcutaneous Daily   irbesartan  75 mg Oral Daily   lactobacillus  1 g Oral TID WC   melatonin  3 mg Oral QHS   multivitamin  1 tablet Oral QHS  pantoprazole  40 mg Oral Daily   polycarbophil  625 mg Oral BID   sucroferric oxyhydroxide  1,000 mg Oral TID WC   torsemide  100 mg Oral Once per day on Sun Tue Thu Sat    Dialysis Orders: [facility] MWF 4h 90min  400/800   108.5kg   2/2 bath  LFA AVF   - No Heparin  - rocaltrol 1.25 mcg po tiw - mircera 225 mcg IV  q 2wks, last 2/12, due 2/26 - last Hb 8.6 on 2/14   Assessment/Plan: Leukocytosis/L foot 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 ABX completed.  Hyponatremia - worsening in the setting of hypervolemia. managing with HD/UF. Fluid restriction reinforced. Resolved, Na+ 130.  ESRD - on HD MWF. HD 07/07/2022 per regular schedule.  HTN/ volume - Getting under edw, UF as tolerated.  EDW should be lower on d/c post BKA, last post wt 100.8kg. feeling dizzy when below this previously. BP at goal today. Recently lowered irbesartan 75 mg PO daily started on 06/16/22 to facilitate volume removal. Continue to UF as tolerated, optimize volume with HD.  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 tsat 14%,  will order iron. On Aranesp 24mcg qTues starting 06/22/22.  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 Cont po vdra and velphoro as binder.  DM2 - Chronic hyperglycemia, poorly controlled DMT2. PMD adjusting meds.  Nutrition - hypoalbuminemia- Continue protein supplement. 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  Revision with aneurysm plication this AM by Dr. Carlis Abbott. Appreciate their assistance.  Dispo - plan for d/c to SNF tomorrow  Jen Mow, PA-C [facility] 07/07/2022,12:33 PM  LOS: 22 days

## 2022-07-07 NOTE — Progress Notes (Signed)
[facility]  Subjective  - no complaints   Objective 135/75 68 97.7 F (36.5 C) (Oral) 17 98%  Intake/Output Summary (Last 24 hours) at 07/07/2022 [id number] Last data filed at 07/06/2022 1856 Gross per 24 hour  Intake 832 ml  Output --  Net 832 ml    Left radiocephalic fistula with good thrill 2 tandem aneurysms with a more proximal aneurysm in the forearm having very thin skin with small associated scab  Laboratory Lab Results: Recent Labs    07/05/22 0921  WBC 7.8  HGB 8.3*  HCT 27.8*  PLT 228   BMET Recent Labs    07/05/22 0921  NA 130*  K 4.6  CL 94*  CO2 24  GLUCOSE 157*  BUN 87*  CREATININE 9.45*  CALCIUM 9.3    COAG Lab Results  Component Value Date   INR 1.3 (H) 04/21/2021   INR 1.3 (H) 04/20/2021   INR 1.2 10/07/2020   No results found for: "PTT"  Assessment/Planning:  62 year old male with end-stage renal disease that vascular surgery was consulted for thinning skin over an aneurysmal left radiocephalic AV fistula.  Discussed plan for left arm AV fistula revision with plication and excision of ulcerated skin.  Possible TDC.  May do this in stages to avoid catheter placement at his request.  Marty Heck 07/07/2022 8:11 AM --

## 2022-07-07 NOTE — Progress Notes (Signed)
Patient ID: Scott Castillo, male   DOB: Feb 01, 1961, 62 y.o.   MRN: [id number]  Met with pt to update him regarding team conference and meeting his goals and beng ready to transfer to SNF tomorrow. Made aware transport is set up for 11:00 [facility]. Will see in am to see if any last minute questions.

## 2022-07-07 NOTE — Patient Care Conference (Signed)
Inpatient RehabilitationTeam Conference and Plan of Care Update Date: 07/07/2022   Time: 12:22 PM    Patient Name: Scott Castillo      Medical Record Number: [id number]  Date of Birth: 1960-09-21 Sex: Male         Room/Bed: 4M11C/4M11C-01 Payor Info: Payor: MEDICARE / Plan: MEDICARE PART A AND B / Product Type: *No Product type* /    Admit Date/Time:  06/15/2022  5:02 PM  Primary Diagnosis:  Unilateral complete BKA, left, initial encounter [facility])  Hospital Problems: Principal Problem:   Unilateral complete BKA, left, initial encounter [facility])    Expected Discharge Date: Expected Discharge Date: 07/08/22  Team Members Present: Physician leading conference: Dr. Jennye Boroughs Social Worker Present: Ovidio Kin, LCSW Nurse Present: Dorien Chihuahua, RN PT Present: Ginnie Smart, PT;Rosita Dechalus, PTA OT Present: Laverle Hobby, OT PPS Coordinator present : Gunnar Fusi, SLP     Current Status/Progress Goal Weekly Team Focus  Bowel/Bladder   ESRD-a , continent of bowel, LBM 07/06/22   Remain continent   Assess toileting QS/PRN    Swallow/Nutrition/ Hydration               ADL's                Mobility   mod I bed mobility, mod I squat pivot and slide board transfers, mod I w/c propulsion but needs assistance for w/c management. Min to [place] sit to stand (minA from 21in or higher modA any lower). Standing tolrance 2-3 min, modA stand pivot with RW. Gait goals were d/c'd due to pt unable to safely clear RLE when hopping   mod I w/c level, supervision transfers  d/c planning    Communication                Safety/Cognition/ Behavioral Observations               Pain   Denies pain   Painfree   Continue to assess pain QS/PRN    Skin   Surgical incision BKA unremakable of drainage, some swellinf and tenderness to incision line after removal of staples, ,   Healing progression continue to surgical area  Assess QS/PRN surgical ara  and skin, address new  findings      Discharge Planning:  Has a SNF bed at [place] and is transferring there tomorrow. Pt aware transport set up for 11:00   Team Discussion: Patient off the unit for a procedure, at goal level. Medically stable to discharge.  Patient on target to meet rehab goals: yes  *See Care Plan and progress notes for long and short-term goals.   Revisions to Treatment Plan:    Teaching Needs: Safety, medications, skin care, transfers, toileting, etc.   Current Barriers to Discharge: Decreased caregiver support, Home enviroment access/layout, and Weight bearing restrictions  Possible Resolutions to Barriers: Family education SNF recommended Ramp for entry to home/home repair completion DME: DA-BSC, W/C, RW     Medical Summary Current Status: L BKA, surgery for AVF today, DM type 2, hypertension  Barriers to Discharge: Pending surgery/plan;Weight bearing restrictions;Renal Insufficiency/Failure  Barriers to Discharge Comments: L BKA, surgery for AVF today, DM type 2, hypertension Possible Resolutions to [employer] Focus: surgery today by vascular for AVF, monitor BP   Continued Need for Acute Rehabilitation Level of Care: The patient requires daily medical management by a physician with specialized training in physical medicine and rehabilitation for the following reasons: Direction of a multidisciplinary physical rehabilitation program  to maximize functional independence : Yes Medical management of patient stability for increased activity during participation in an intensive rehabilitation regime.: Yes Analysis of laboratory values and/or radiology reports with any subsequent need for medication adjustment and/or medical intervention. : Yes   I attest that I was present, lead the team conference, and concur with the assessment and plan of the team.   Dorien Chihuahua B 07/07/2022, 2:34 PM

## 2022-07-07 NOTE — Op Note (Signed)
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 Marty Heck, MD  Assistant: Roxy Horseman, [place]  Indications: 62 year old male with end-stage renal disease.  Patient is admitted to rehab after below-knee amputation with orthopedic surgery.  Vascular surgery was consulted on Monday for thinning skin over his AV fistula with risk for bleeding.  He presents for left arm AV fistula revision after risks benefits discussed.  Findings: Elliptical incision made over the more proximal aneurysm in the left forearm AV fistula where the skin was thin with a small scab.  Ultimately the aneurysmal fistula in this segment was fully mobilized and ultimately the fistula was controlled and the anterior wall of the aneurysm was resected with excision of thin skin.  The fistula was repaired with plication.  Good thrill at completion.  Anesthesia: MAC with regional  Details: Patient was taken to the operating room after informed consent was obtained.  Placed on operative table supine position.  The left arm was then prepped and draped in standard sterile fashion.  Antibiotics were given and timeout performed.  I initially marked out an elliptical incision over the more proximal forearm aneurysm where the skin was notably thin.  Incision was made here with 15 blade scalpel and dissected down with Bovie cautery.  I did get into the anterior wall of the fistula and this was repaired with multiple 5-0 Prolene's.  I then used Bovie cautery to fully mobilize the segment of the aneurysmal radiocephalic fistula.  Once this was fully mobilized I gave the patient 5000 units IV heparin.  This circulated for 2 minutes and fistula clamps were used to control the proximal and distal segment of the fistula that  had been mobilized.  I then used Metzenbaum scissors and resected the anterior wall of the aneurysm including all the thin skin.  I then plicated the aneurysm with a 5-0 Prolene running anastomosis and this was de-aired prior to completion.  The wound was irrigated and closed with 3-0 Vicryl 4-0 Monocryl and Dermabond.  Good thrill at completion.  He does does have an area distal forearm that we did not revise that should be adequate for access.  Taken to recovery in stable condition.  Complication: None  Condition: Stable  Marty Heck, MD [facility] [place] Office: [place]

## 2022-07-07 NOTE — Transfer of Care (Signed)
Immediate Anesthesia Transfer of Care Note  Patient: Scott Castillo  Procedure(s) Performed: REVISON & PLICATION  OF ARTERIOVENOUS FISTULA LEFT ARM (Left: Arm Lower) INSERTION OFTUNNELED  DIALYSIS CATHETER  Patient Location: PACU  Anesthesia Type:MAC  Level of Consciousness: drowsy  Airway & Oxygen Therapy: Patient Spontanous Breathing and Patient connected to nasal cannula oxygen  Post-op Assessment: Report given to RN and Post -op Vital signs reviewed and stable  Post vital signs: Reviewed and stable  Last Vitals:  Vitals Value Taken Time  BP 114/65 07/07/22 1005  Temp    Pulse 66 07/07/22 1010  Resp 23 07/07/22 1010  SpO2 92 % 07/07/22 1010  Vitals shown include unvalidated device data.  Last Pain:  Vitals:   07/07/22 0545  TempSrc: Oral  PainSc: 3          Complications: No notable events documented.

## 2022-07-07 NOTE — Progress Notes (Signed)
PROGRESS NOTE   Subjective/Complaints: Vascular surgery completed today, pt reports he is still feels a little strange from the anesthesia. He is on [employer] O2 since the surgery also. No additional concerns.   ROS: + LLE pain - well controlled overall denies HA, fever, rash, nausea, vomiting, diarrhea, abdominal pain, shortness of breath or chest pain,  vision changes or mood change.   Objective:   No results found.    Recent Labs    07/05/22 0921  WBC 7.8  HGB 8.3*  HCT 27.8*  PLT 228    Recent Labs    07/05/22 0921  NA 130*  K 4.6  CL 94*  CO2 24  GLUCOSE 157*  BUN 87*  CREATININE 9.45*  CALCIUM 9.3     Intake/Output Summary (Last 24 hours) at 07/07/2022 0829 Last data filed at 07/06/2022 1856 Gross per 24 hour  Intake 832 ml  Output --  Net 832 ml      Pressure Injury 06/27/22 Heel Right Deep Tissue Pressure Injury - Purple or maroon localized area of discolored intact skin or blood-filled blister due to damage of underlying soft tissue from pressure and/or shear. (Active)  06/27/22 2125  Location: Heel  Location Orientation: Right  Staging: Deep Tissue Pressure Injury - Purple or maroon localized area of discolored intact skin or blood-filled blister due to damage of underlying soft tissue from pressure and/or shear.  Wound Description (Comments):   Present on Admission: No    Physical Exam: Vital Signs Blood pressure 135/75, pulse 68, temperature 97.7 F (36.5 C), temperature source Oral, resp. rate 17, height 6' (1.829 m), weight 101.3 kg, SpO2 98 %.   Constitutional: No distress . Vital signs reviewed. Working with therapy in gym HEENT: NCAT, conjugate gaze Neck: supple Cardiovascular: RRR without murmur. No JVD    Respiratory/Chest: CTA Bilaterally without wheezes or rales. Good air movement, 3L Nasal O2 GI/Abdomen: BS +, non-tender, non-distended Ext: no clubbing, cyanosis, or edema AVF LUE  with anneurysms Psych: pleasant and cooperative  Skin: R heel wound, scattered abrasions proximally.    Neuro:    Alert and awake, CN 2-12 grossly intact, follows commands    Comments: Decreased to light touch from R mid shin downwards;  Musculoskeletal:   Moving all 4 limbs antigravity and against resistance.   UE strength 5/5 in Biceps, triceps, WE, grip and FA B/L. Strength symmetrical and unchanged in BUE. Subjective sensory loss LUE? RLE- 5/5 in HF, KE, KF- but moderate foot drop- DF 1/5 and PF 1/5 LLE- 4/5 HF, KE--no change     Assessment/Plan: 1. Functional deficits which require 3+ hours per day of interdisciplinary therapy in a comprehensive inpatient rehab setting. Physiatrist is providing close team supervision and 24 hour management of active medical problems listed below. Physiatrist and rehab team continue to assess barriers to discharge/monitor patient progress toward functional and medical goals  Care Tool:  Bathing    Body parts bathed by patient: Right arm, Left arm, Abdomen, Chest, Front perineal area, Right upper leg, Left upper leg, Face, Buttocks, Right lower leg   Body parts bathed by helper: Buttocks, Right lower leg Body parts n/a: Left lower leg  Bathing assist Assist Level: Contact Guard/Touching assist     Upper Body Dressing/Undressing Upper body dressing   What is the patient wearing?: Pull over shirt    Upper body assist Assist Level: Set up assist    Lower Body Dressing/Undressing Lower body dressing      What is the patient wearing?: Pants, Incontinence brief     Lower body assist Assist for lower body dressing: Minimal Assistance - Patient > 75%     Toileting Toileting Toileting Activity did not occur (Clothing management and hygiene only): N/A (no void or bm)  Toileting assist Assist for toileting: Contact Guard/Touching assist     Transfers Chair/bed transfer  Transfers assist     Chair/bed transfer assist level:  Independent with assistive device Chair/bed transfer assistive device: Armrests, Sliding board   Locomotion Ambulation   Ambulation assist   Ambulation activity did not occur: Safety/medical concerns          Walk 10 feet activity   Assist  Walk 10 feet activity did not occur: Safety/medical concerns        Walk 50 feet activity   Assist Walk 50 feet with 2 turns activity did not occur: Safety/medical concerns         Walk 150 feet activity   Assist Walk 150 feet activity did not occur: Safety/medical concerns         Walk 10 feet on uneven surface  activity   Assist Walk 10 feet on uneven surfaces activity did not occur: Safety/medical concerns         Wheelchair     Assist Is the patient using a wheelchair?: Yes Type of Wheelchair: Manual    Wheelchair assist level: Independent Max wheelchair distance: 150    Wheelchair 50 feet with 2 turns activity    Assist        Assist Level: Independent   Wheelchair 150 feet activity     Assist      Assist Level: Independent   Blood pressure 135/75, pulse 68, temperature 97.7 F (36.5 C), temperature source Oral, resp. rate 17, height 6' (1.829 m), weight 101.3 kg, SpO2 98 %.  Medical Problem List and Plan: 1. 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  -Continue CIR therapies including PT, OT    -DC to SNF tomorrow  -Team conference today please see physician documentation under team conference tab, met with team  to discuss problems,progress, and goals. Formulized individual treatment plan based on medical history, underlying problem and comorbidities.       2.  Antithrombotics: -DVT/anticoagulation:  Pharmaceutical: Heparin             -antiplatelet therapy: NA 3. Pain Management: Oxycodone prn for pain. No  phantom pain so far  -muscle rub for R thigh started- pain improved  3/12 gabapentin 200mg  qhs helpful for LUE, no SE  3/13 pain controlled-doing well  4. Mood/Behavior/Sleep: LCSW to follow for evaluation and support.              -antipsychotic agents: N/A  -3/8 Melatonin 3mg  HS started  5. Neuropsych/cognition: This patient is capable of making decisions on his own behalf. 6. Skin/Wound Care: Routine pressure relief measures.   -ordered smaller  2X shrinkers per [employer] which pt is now wearing  -staples out- wear shrinker directly over leg without dressing.    7. Fluids/Electrolytes/Nutrition: Strict I/O. Daily weights. Enforce 1200 cc FR.             --Reports on multiple supplements--d/c nephro.  --Continue Juven bid, Zinc and Vitamin-C to promote wound healing.  -Hyponatremia- improved on labs 3/4 to 131 8. Left calf staph positive cultures: IV antibiotics changed to Augmentin X 19 days per recs.  (stop 07/03/22) 9. HTN: Monitor BP TID--continue Avapro (reduced to 75mg  2/27) and Demadex -STTS.   -3/7 irbesartan was previously decreased to allow for more volume to be reduced by nephrology, continue to monitor, BP mildy elevated but stable overall, hesitant to increase BP medications due to dialysis, if BP increases further may need increase irbesartan, Add hydralazine PRN SBP > 180  -3/15 BP with fair to borderlinecontrol, continue current regimen, parameters placed for PT/OT - 3/20 well controlled     07/07/2022    6:29 AM 07/07/2022    5:45 AM 07/06/2022    7:10 PM  Vitals with BMI  Weight 223 lbs 5 oz    BMI [id number]    Systolic  [id number]  Diastolic  75 60  Pulse  68 65    10. T2DM: Hgb A1C- 7.4. Was on Glimepiride and Rubelsus PTA.  -- Monitor BS ac/hs and use SSI for tighter control.         CBG (last 3)  Recent Labs    07/06/22 2016 07/06/22 2244 07/07/22 0627  GLUCAP 123* 156* 122*    3/12 increased am semglee to 12u  3/14-15 cbg's leveling out. Continue semglee 12u  qam and 4u qpm   -continue scheduled novolog 4u tid ac  3/17: Blood sugars mildly elevated this a.m., monitor 1 to 2 days for trend  3/18 CBGs improved, continue current regimen  11. ESRD: Volume being managed with HD MWF. Is basically anuric- voided x2 since admission. Via fistula --  HD at the end of the day to help with tolerance of therapy.   -3/20 Left arm AV fistula revision completed today by vascular 12.  Leucocytosis due to LLE osteomyelitis- : Monitor for fevers and other signs of infection.              --augmentin complete 3/16   -3/18 WBC WNL at 7.8 13. Hyponatremia: Being managed with HD.  14. Cough: resolved  15. Right hip pain: Fall 3 weeks PTA. Will order Voltaren gel TID.  16. Loose stools- due to IV ABX per pt- might need a stool bulker/fiber-   -3/1 Fibercon started 625mg  BID  -3/17-large BM this a.m. 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

## 2022-07-08 ENCOUNTER — Encounter (HOSPITAL_COMMUNITY): Payer: Self-pay | Admitting: Vascular Surgery

## 2022-07-08 DIAGNOSIS — T82898A Other specified complication of vascular prosthetic devices, implants and grafts, initial encounter: Secondary | ICD-10-CM | POA: Insufficient documentation

## 2022-07-08 LAB — GLUCOSE, CAPILLARY: Glucose-Capillary: 143 mg/dL — ABNORMAL HIGH (ref 70–99)

## 2022-07-08 NOTE — Progress Notes (Signed)
Contacted [place] to advise clinic that pt will d/c to snf today and pt should resume care tomorrow as planned.   Melven Sartorius Renal Navigator [phone]

## 2022-07-08 NOTE — Progress Notes (Signed)
PROGRESS NOTE   Subjective/Complaints: No new concerns. Going to SNF today.   ROS: + LLE pain - well controlled overall denies HA, fever, rash, nausea, vomiting, diarrhea, abdominal pain, shortness of breath or chest pain,  vision changes or mood change.  Cough improved  Objective:   No results found.    Recent Labs    07/05/22 0921  WBC 7.8  HGB 8.3*  HCT 27.8*  PLT 228    Recent Labs    07/05/22 0921  NA 130*  K 4.6  CL 94*  CO2 24  GLUCOSE 157*  BUN 87*  CREATININE 9.45*  CALCIUM 9.3     Intake/Output Summary (Last 24 hours) at 07/08/2022 0825 Last data filed at 07/08/2022 0028 Gross per 24 hour  Intake 440 ml  Output 2010 ml  Net -1570 ml      Pressure Injury 06/27/22 Heel Right Deep Tissue Pressure Injury - Purple or maroon localized area of discolored intact skin or blood-filled blister due to damage of underlying soft tissue from pressure and/or shear. (Active)  06/27/22 2125  Location: Heel  Location Orientation: Right  Staging: Deep Tissue Pressure Injury - Purple or maroon localized area of discolored intact skin or blood-filled blister due to damage of underlying soft tissue from pressure and/or shear.  Wound Description (Comments):   Present on Admission: No    Physical Exam: Vital Signs Blood pressure 126/76, pulse 85, temperature 98 F (36.7 C), temperature source Oral, resp. rate 17, height 6' (1.829 m), weight 99.4 kg, SpO2 98 %.   Constitutional: No distress . Vital signs reviewed. Lying in bed HEENT: NCAT, conjugate gaze, MMM Neck: supple Cardiovascular: RRR without murmur. No JVD    Respiratory/Chest: CTA Bilaterally without wheezes or rales. Good air movement, 3L Nasal O2 GI/Abdomen: BS +, non-tender, non-distended Ext: no clubbing, cyanosis, or edema AVF LUE with anneurysms- surgical incision no signs of infection Psych: pleasant and cooperative  Skin: R heel wound,  scattered abrasions proximally-improved   Neuro:    Alert and awake, CN 2-12 grossly intact, follows commands    Comments: Decreased to light touch from R mid shin downwards;  Musculoskeletal:   Moving all 4 limbs antigravity and against resistance.   UE strength 5/5 in Biceps, triceps, WE, grip and FA B/L. Strength symmetrical and unchanged in BUE. Subjective sensory loss LUE? RLE- 5/5 in HF, KE, KF- but moderate foot drop- DF 1/5 and PF 1/5 LLE- 4/5 HF, KE--no change     Assessment/Plan: 1. Functional deficits which require 3+ hours per day of interdisciplinary therapy in a comprehensive inpatient rehab setting. Physiatrist is providing close team supervision and 24 hour management of active medical problems listed below. Physiatrist and rehab team continue to assess barriers to discharge/monitor patient progress toward functional and medical goals  Care Tool:  Bathing    Body parts bathed by patient: Right arm, Left arm, Abdomen, Chest, Front perineal area, Right upper leg, Left upper leg, Face, Buttocks, Right lower leg   Body parts bathed by helper: Buttocks, Right lower leg Body parts n/a: Left lower leg   Bathing assist Assist Level: Contact Guard/Touching assist     Upper Body Dressing/Undressing  Upper body dressing   What is the patient wearing?: Pull over shirt    Upper body assist Assist Level: Set up assist    Lower Body Dressing/Undressing Lower body dressing      What is the patient wearing?: Pants, Incontinence brief     Lower body assist Assist for lower body dressing: Minimal Assistance - Patient > 75%     Toileting Toileting Toileting Activity did not occur (Clothing management and hygiene only): N/A (no void or bm)  Toileting assist Assist for toileting: Contact Guard/Touching assist     Transfers Chair/bed transfer  Transfers assist     Chair/bed transfer assist level: Independent with assistive device Chair/bed transfer assistive  device: Armrests, Sliding board   Locomotion Ambulation   Ambulation assist   Ambulation activity did not occur: Safety/medical concerns          Walk 10 feet activity   Assist  Walk 10 feet activity did not occur: Safety/medical concerns        Walk 50 feet activity   Assist Walk 50 feet with 2 turns activity did not occur: Safety/medical concerns         Walk 150 feet activity   Assist Walk 150 feet activity did not occur: Safety/medical concerns         Walk 10 feet on uneven surface  activity   Assist Walk 10 feet on uneven surfaces activity did not occur: Safety/medical concerns         Wheelchair     Assist Is the patient using a wheelchair?: Yes Type of Wheelchair: Manual    Wheelchair assist level: Independent Max wheelchair distance: 150    Wheelchair 50 feet with 2 turns activity    Assist        Assist Level: Independent   Wheelchair 150 feet activity     Assist      Assist Level: Independent   Blood pressure 126/76, pulse 85, temperature 98 F (36.7 C), temperature source Oral, resp. rate 17, height 6' (1.829 m), weight 99.4 kg, SpO2 98 %.  Medical Problem List and Plan: 1. 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  -Continue CIR therapies including PT, OT    -DC to SNF tomorrow  -Team conference today please see physician documentation under team conference tab, met with team  to discuss problems,progress, and goals. Formulized individual treatment plan based on medical history, underlying problem and comorbidities.   -DC to SNF     2.  Antithrombotics: -DVT/anticoagulation:  Pharmaceutical: Heparin             -antiplatelet therapy: NA 3. Pain Management: Oxycodone prn for pain. No phantom pain so far  -muscle rub for R thigh started- pain  improved  3/12 gabapentin 200mg  qhs helpful for LUE, no SE  3/13 pain controlled-doing well  4. Mood/Behavior/Sleep: LCSW to follow for evaluation and support.              -antipsychotic agents: N/A  -3/8 Melatonin 3mg  HS started  5. Neuropsych/cognition: This patient is capable of making decisions on his own behalf. 6. Skin/Wound Care: Routine pressure relief measures.   -ordered smaller 2X shrinkers per [employer] which pt is now wearing  -staples  out- wear shrinker directly over leg without dressing.    7. Fluids/Electrolytes/Nutrition: Strict I/O. Daily weights. Enforce 1200 cc FR.             --Reports on multiple supplements--d/c nephro.  --Continue Juven bid, Zinc and Vitamin-C to promote wound healing.  -Hyponatremia- improved on labs 3/4 to 131 8. Left calf staph positive cultures: IV antibiotics changed to Augmentin X 19 days per recs.  (stop 07/03/22) 9. HTN: Monitor BP TID--continue Avapro (reduced to 75mg  2/27) and Demadex -STTS.   -3/7 irbesartan was previously decreased to allow for more volume to be reduced by nephrology, continue to monitor, BP mildy elevated but stable overall, hesitant to increase BP medications due to dialysis, if BP increases further may need increase irbesartan, Add hydralazine PRN SBP > 180  -3/15 BP with fair to borderlinecontrol, continue current regimen, parameters placed for PT/OT - 3/21 fair control, continue to monitor at [facility]     07/08/2022    4:05 AM 07/08/2022    1:00 AM 07/08/2022   12:28 AM  Vitals with BMI  Weight   219 lbs 2 oz  BMI   [id number]  Systolic [id number]  Diastolic 76 78 79  Pulse 85 77 76    10. T2DM: Hgb A1C- 7.4. Was on Glimepiride and Rubelsus PTA.  -- Monitor BS ac/hs and use SSI for tighter control.         CBG (last 3)  Recent Labs    07/07/22 1637 07/07/22 1955 07/08/22 0615  GLUCAP 192* 147* 143*    3/12 increased am semglee to 12u  3/14-15 cbg's leveling out. Continue semglee 12u qam and 4u qpm   -continue  scheduled novolog 4u tid ac  3/17: Blood sugars mildly elevated this a.m., monitor 1 to 2 days for trend  3/19 Fair control Continue current doses insulin   11. ESRD: Volume being managed with HD MWF. Is basically anuric- voided x2 since admission. Via fistula --  HD at the end of the day to help with tolerance of therapy.   -3/20 Left arm AV fistula revision completed today by vascular  F/u with vascular surgery 3-4 weeks 12.  Leucocytosis due to LLE osteomyelitis- : Monitor for fevers and other signs of infection.              --augmentin complete 3/16   -3/18 WBC WNL at 7.8 13. Hyponatremia: Being managed with HD.  14. Cough: resolved  15. Right hip pain: Fall 3 weeks PTA. Will order Voltaren gel TID.  16. Loose stools- due to IV ABX per pt- might need a stool bulker/fiber-   -3/1 Fibercon started 625mg  BID  -3/17-large BM this a.m. 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

## 2022-07-08 NOTE — Progress Notes (Addendum)
  Progress Note    07/08/2022 7:33 AM 1 Day Post-Op  Subjective:  no complaints.  Ready to go home today.   Vitals:   07/08/22 0100 07/08/22 0405  BP: (!) 140/78 126/76  Pulse: 77 85  Resp: 17 17  Temp: 97.7 F (36.5 C) 98 F (36.7 C)  SpO2: 96% 98%   Physical Exam: Lungs:  non labored Incisions:  L arm incision c/d/i Extremities:  grip strength symmetrical; palpable thrill beyond incision Neurologic: A&O  CBC    Component Value Date/Time   WBC 7.8 07/05/2022 0921   RBC 3.04 (L) 07/05/2022 0921   HGB 8.3 (L) 07/05/2022 0921   HCT 27.8 (L) 07/05/2022 0921   PLT 228 07/05/2022 0921   MCV 91.4 07/05/2022 0921   MCH 27.3 07/05/2022 0921   MCHC 29.9 (L) 07/05/2022 0921   RDW 18.2 (H) 07/05/2022 0921   LYMPHSABS 0.6 (L) 06/28/2022 0610   MONOABS 0.6 06/28/2022 0610   EOSABS 0.2 06/28/2022 0610   BASOSABS 0.1 06/28/2022 0610    BMET    Component Value Date/Time   NA 130 (L) 07/05/2022 0921   K 4.6 07/05/2022 0921   CL 94 (L) 07/05/2022 0921   CO2 24 07/05/2022 0921   GLUCOSE 157 (H) 07/05/2022 0921   BUN 87 (H) 07/05/2022 0921   CREATININE 9.45 (H) 07/05/2022 0921   CALCIUM 9.3 07/05/2022 0921   GFRNONAA 6 (L) 07/05/2022 0921   GFRAA 14 (L) 07/06/2016 0502    INR    Component Value Date/Time   INR 1.3 (H) 04/21/2021 0207     Intake/Output Summary (Last 24 hours) at 07/08/2022 [id number] Last data filed at 07/08/2022 0028 Gross per 24 hour  Intake 440 ml  Output 2010 ml  Net -1570 ml     Assessment/Plan:  62 y.o. male is s/p L radiocephalic fistula revision 1 Day Post-Op   Patent L radiocephalic fistula; completed HD treatment last night without complication Office will arrange incision check in about 3-4 weeks; please avoid incision for about 4-6 weeks Ok for discharge from vascular standpoint   Dagoberto Ligas, PA-C [facility] [phone] 07/08/2022 7:33 AM  I have seen and evaluated the patient. I agree with the PA note as  documented above.  Postop day 1 status post revision of left radiocephalic AV fistula with aneurysm plication and excision of thinning skin.  Incision looks good with palpable thrill.  They were able to use the fistula last night by sticking distal to the repair.  Will arrange follow-up in 1 month to monitor the incision and discuss intervention on the second aneurysm pending how this looks.  Marty Heck, MD [facility] [place] Office: [phone]

## 2022-07-08 NOTE — Progress Notes (Signed)
Received transfer report from Dialysis RN, patient stable and in no distress  0400 Patient awake and cooperative, anticipates discharge to [facility]-in [place], [place] gor additional rehab states patient

## 2022-07-08 NOTE — Progress Notes (Signed)
Post Hemodialysis   07/08/22 0028  Vitals  Temp (!) 97.5 F (36.4 C)  Pulse Rate 76  Resp 14  BP (!) 145/79  SpO2 95 %  O2 Device Room Air  Weight 99.4 kg  Type of Weight Post-Dialysis  Oxygen Therapy  Patient Activity (if Appropriate) In bed  Pulse Oximetry Type Continuous  Oximetry Probe Site Changed No  Post Treatment  Dialyzer Clearance Lightly streaked  Duration of HD Treatment -hour(s) 3 hour(s)  Liters Processed 70.3  Fluid Removed (mL) 2000 mL  Tolerated HD Treatment Yes  Post-Hemodialysis Comments Tx completed and tolerated well  AVG/AVF Arterial Site Held (minutes) 5 minutes  AVG/AVF Venous Site Held (minutes) 5 minutes

## 2022-07-08 NOTE — Progress Notes (Signed)
[facility] Progress Note   Subjective:   Patient seen and examined at bedside.  Plan to d/c to SNF today.  Denies CP, SOB, abdominal pain and n/v/d.  No issues with using AVF for HD yesterday.  Tolerated treatment well.  [employer] agreed to donate lumber for his ramp.   Objective Vitals:   07/08/22 0006 07/08/22 0028 07/08/22 0100 07/08/22 0405  BP: 139/89 (!) 145/79 (!) 140/78 126/76  Pulse: 76 76 77 85  Resp: 12 14 17 17   Temp:  (!) 97.5 F (36.4 C) 97.7 F (36.5 C) 98 F (36.7 C)  TempSrc:  Oral Oral Oral  SpO2: 95% 95% 96% 98%  Weight:  99.4 kg    Height:       Physical Exam General:chroncially ill appearing male in NAD Heart:RRR, no mrg Lungs:CTAB, nml WOB on RA Abdomen:soft, NTND Extremities:trace RLE edema, L BKA Dialysis Access: LU AVF +b/t   Filed Weights   07/07/22 0629 07/07/22 2029 07/08/22 0028  Weight: 101.3 kg 101.3 kg 99.4 kg    Intake/Output Summary (Last 24 hours) at 07/08/2022 1015 Last data filed at 07/08/2022 0836 Gross per 24 hour  Intake 593 ml  Output 2000 ml  Net -1407 ml    Additional Objective Labs: Basic Metabolic Panel: Recent Labs  Lab 07/02/22 1036 07/05/22 0921  NA 131* 130*  K 4.7 4.6  CL 94* 94*  CO2 23 24  GLUCOSE 84 157*  BUN 71* 87*  CREATININE 7.94* 9.45*  CALCIUM 9.5 9.3  PHOS 4.8* 4.2   Liver Function Tests: Recent Labs  Lab 07/02/22 1036 07/05/22 0921  ALBUMIN 2.6* 2.4*    CBC: Recent Labs  Lab 07/02/22 1036 07/05/22 0921  WBC 8.6 7.8  HGB 8.7* 8.3*  HCT 29.3* 27.8*  MCV 91.8 91.4  PLT 230 228   CBG: Recent Labs  Lab 07/07/22 1007 07/07/22 1112 07/07/22 1637 07/07/22 1955 07/08/22 0615  GLUCAP 120* 96 192* 147* 143*   Iron Studies:  Recent Labs    07/05/22 1527  IRON 30*  TIBC 218*   Lab Results  Component Value Date   INR 1.3 (H) 04/21/2021   INR 1.3 (H) 04/20/2021   INR 1.2 10/07/2020   Studies/Results: No results found.  Medications:  ferric gluconate  (FERRLECIT) IVPB      (feeding supplement) PROSource Plus  30 mL Oral BID BM   vitamin C  1,000 mg Oral Daily   atorvastatin  40 mg Oral Daily   calcitRIOL  1.25 mcg Oral Q M,W,F-HD   carvedilol  25 mg Oral BID WC   Chlorhexidine Gluconate Cloth  6 each Topical Daily   Chlorhexidine Gluconate Cloth  6 each Topical Q0600   darbepoetin (ARANESP) injection - DIALYSIS  200 mcg Subcutaneous Q Tue-1800   diclofenac Sodium  2 g Topical TID AC   gabapentin  200 mg Oral QHS   Gerhardt's butt cream   Topical BID   guaiFENesin  600 mg Oral BID   heparin  5,000 Units Subcutaneous Q8H   insulin aspart  0-5 Units Subcutaneous QHS   insulin aspart  0-9 Units Subcutaneous TID with meals   insulin aspart  4 Units Subcutaneous TID WC   insulin glargine-yfgn  12 Units Subcutaneous Daily   And   insulin glargine-yfgn  4 Units Subcutaneous Daily   irbesartan  75 mg Oral Daily   lactobacillus  1 g Oral TID WC   melatonin  3 mg Oral QHS   multivitamin  1 tablet Oral QHS   pantoprazole  40 mg Oral Daily   polycarbophil  625 mg Oral BID   sucroferric oxyhydroxide  1,000 mg Oral TID WC   torsemide  100 mg Oral Once per day on Sun Tue Thu Sat    Dialysis Orders: [facility] MWF 4h 63min  400/800   108.5kg   2/2 bath  LFA AVF   - No Heparin  - rocaltrol 1.25 mcg po tiw - mircera 225 mcg IV  q 2wks, last 2/12, due 2/26 - last Hb 8.6 on 2/14   Assessment/Plan: Leukocytosis/L foot 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 ABX completed.  Hyponatremia - worsening in the setting of hypervolemia. managing with HD/UF. Fluid restriction reinforced. Resolved, Na+ 130.  ESRD - on HD MWF. HD 03/22/2024at outpatient center. HTN/ volume - Getting under edw, UF as tolerated.  EDW should be lower on d/c post BKA, last post wt 100.8kg. feeling dizzy when below this previously. BP at goal today. Recently lowered irbesartan 75 mg PO daily started on 06/16/22 to facilitate volume removal. Continue to UF as tolerated, optimize volume  with HD.  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 tsat 14%, will order iron. On Aranesp 281mcg qTues starting 06/22/22.  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 Cont po vdra and velphoro as binder.  DM2 - Chronic hyperglycemia, poorly controlled DMT2. PMD adjusting meds.  Nutrition - hypoalbuminemia- Continue protein supplement. 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  Revision with aneurysm plication this AM by Dr. Carlis Abbott. Appreciate their assistance.  Dispo - plan for d/c to SNF today  Jen Mow, PA-C [place] 07/08/2022,10:15 AM  LOS: 23 days

## 2022-07-08 NOTE — Progress Notes (Signed)
Inpatient Rehabilitation Discharge Medication Review by a Pharmacist  A complete drug regimen review was completed for this patient to identify any potential clinically significant medication issues.   High Risk Drug Classes Is patient taking? 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 PTA meds: discontinued on  [facility] acute care discharge 06/15/22:  Amaryl and Rybelsus (semaglutide)  Communicate to patient /caregiver on discharge.  Discharge AVS instructs discontinuation of these medications.  Other             Clinically significant medication issues were identified that warrant physician communication and completion of prescribed/recommended actions by midnight of the next day:  No     Time spent performing this drug regimen review (minutes):  30   Thank you for allowing pharmacy to be part of this patients care team.  Nicole Cella, RPh Clinical Pharmacist 07/08/2022 11:28 AM

## 2022-07-13 ENCOUNTER — Ambulatory Visit (INDEPENDENT_AMBULATORY_CARE_PROVIDER_SITE_OTHER): Payer: BC Managed Care – PPO | Admitting: Family

## 2022-07-13 DIAGNOSIS — Z89512 Acquired absence of left leg below knee: Secondary | ICD-10-CM

## 2022-07-21 ENCOUNTER — Encounter: Payer: Self-pay | Admitting: Family

## 2022-07-21 NOTE — Progress Notes (Signed)
Post-Op Visit Note   Patient: Scott Castillo           Date of Birth: 07-19-1960           MRN: [id number] Visit Date: 07/13/2022 PCP: Algis Greenhouse, MD  Chief Complaint:  Chief Complaint  Patient presents with   Left Leg - Routine Post Op    06/11/2022 left BKA     HPI:  HPI The patient is a 62 year old gentleman who presents status post left below-knee amputation February 23 he is wearing a shrinker and limb protector.  Overall feels well.  His staples have been harvested prior to hospital discharge. Patient is a new left transtibial  amputee.  Patient's current comorbidities are not expected to impact the ability to function with the prescribed prosthesis. Patient verbally communicates a strong desire to use a prosthesis. Patient currently requires mobility aids to ambulate without a prosthesis.  Expects not to use mobility aids with a new prosthesis.  Patient is a K2 level ambulator that will use a prosthesis to walk around their home and the community over low level environmental barriers.     Ortho Exam On examination of the left residual limb this is well consolidated well-healed there is no open area no erythema  Visit Diagnoses: No diagnosis found.  Plan: Continue shrinker.  May discontinue the limb protector.  Proceed with prosthesis set up.  Follow-Up Instructions: No follow-ups on file.   Imaging: No results found.  Orders:  No orders of the defined types were placed in this encounter.  No orders of the defined types were placed in this encounter.    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   Unilateral complete BKA, left, initial encounter 06/15/2022   Abscess of left lower leg 06/11/2022   Subacute osteomyelitis of left foot 06/10/2022   Pressure injury of skin 06/09/2022   Diabetic infection of left foot 06/09/2022   HTN (hypertension) 06/09/2022   Right hip pain  06/09/2022   Expressive aphasia 04/20/2021   Obesity (BMI 30-39.9) 10/08/2020   Hyperkalemia, diminished renal excretion 08/15/2020   ESRD (end stage renal disease) 09/04/2019   Type 2 diabetes mellitus with ESRD (end-stage renal disease) 07/03/2016   Hypertensive urgency 07/03/2016   Hyperlipidemia 07/03/2016   Anemia of chronic disease 07/03/2016   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   H/O pancreatic cancer    HLD (hyperlipidemia)    HTN (hypertension)    Lumbar radiculopathy, chronic    hemilamectomy '94   Venous stasis dermatitis of both lower extremities     Family History  Problem Relation Age of Onset   Edema Mother    Heart attack Father    Heart failure Father    Heart attack Brother    Heart attack Brother     Past Surgical History:  Procedure Laterality Date   A/V FISTULAGRAM Left 09/27/2019   Procedure: A/V FISTULAGRAM;  Surgeon: Marty Heck, MD;  Location: [facility];  Service: Cardiovascular;  Laterality: Left;   AMPUTATION Left 06/11/2022   Procedure: LEFT BELOW THE KNEE AMPUTATION;  Surgeon: Newt Minion, MD;  Location: [place];  Service: Orthopedics;  Laterality: Left;   AV FISTULA PLACEMENT Left 09/17/2016  Procedure: ARTERIOVENOUS (AV) FISTULA CREATION;  Surgeon: [name], MD;  Location: [facility];  Service: Vascular;  Laterality: Left;   CATARACT EXTRACTION W/ INTRAOCULAR LENS IMPLANT Left 2015   ENDOVENOUS ABLATION SAPHENOUS VEIN W/ LASER Right 04/08/2016   EVLA R greater saphenous vein by Curt Jews MD   ENDOVENOUS ABLATION SAPHENOUS VEIN W/ LASER Left 05/13/2016   endovenous laser ablation (left greater saphenous vein) by Curt Jews MD    FOOT SURGERY     HEMILAMINOTOMY LUMBAR SPINE  '94   INSERTION OF DIALYSIS CATHETER N/A 07/07/2022   Procedure: INSERTION OFTUNNELED   DIALYSIS CATHETER;  Surgeon: Marty Heck, MD;  Location: [facility];  Service: Vascular;  Laterality: N/A;   REVISON OF ARTERIOVENOUS FISTULA Left 07/07/2022   Procedure: REVISON  OF ARTERIOVENOUS FISTULA LEFT ARM, PLICATION OF ANEURYSM.;  Surgeon: Marty Heck, MD;  Location: [facility];  Service: Vascular;  Laterality: Left;  Block with MAC   TONSILLECTOMY     Social History   Occupational History   Not on file  Tobacco Use   Smoking status: Never   Smokeless tobacco: Never  Substance and Sexual Activity   Alcohol use: No   Drug use: No   Sexual activity: Not on file

## 2022-08-02 NOTE — Progress Notes (Unsigned)
POST OPERATIVE OFFICE NOTE    CC:  F/u for surgery  HPI:  This is a 62 y.o. male who is s/p left arm AVF revision with excision of thinning skin and small scab on 07/07/2022 by Dr. Chestine Spore.  Dialysis access hx:  -left RC AVF 09/17/2016 Dr. Arbie Cookey -fistulogram 09/27/2019 Dr. Chestine Spore - left arm AVF revision with excision of thinning skin and small scab on 07/07/2022 by Dr. Chestine Spore  Pt states he does not have pain/numbness in the left hand.  He states they are able to stick below the incision and the fistula is working well.  He did have a left leg amputation by Dr. Lajoyce Corners in February and is going home from rehab tomorrow.   The pt is on dialysis M/W/F at [facility] location.   Allergies  Allergen Reactions   Pioglitazone Swelling    Site of swelling not recalled by patient   Adhesive [Tape] Other (See Comments)    Causes redness, pt prefers paper tape    Heparin Other (See Comments)    Per pt he has episodes where he can't speak. He has tolerated this med multiple times since this entered so unlikely it is true allergy   Latex Rash    Redness, also    Current Outpatient Medications  Medication Sig Dispense Refill   ascorbic acid (VITAMIN C) 1000 MG tablet Take 1,000 mg by mouth daily.     atorvastatin (LIPITOR) 40 MG tablet Take 1 tablet (40 mg total) by mouth daily. 30 tablet 0   calcitRIOL (ROCALTROL) 0.25 MCG capsule Take 5 capsules (1.25 mcg total) by mouth every Monday, Wednesday, and Friday with hemodialysis.     carvedilol (COREG) 25 MG tablet Take 1 tablet (25 mg total) by mouth 2 (two) times daily with a meal. 60 tablet 0   co-enzyme Q-10 30 MG capsule Take 30 mg by mouth 2 (two) times daily.     cyanocobalamin 1000 MCG tablet Take 1,000 mcg by mouth daily.     diclofenac Sodium (VOLTAREN) 1 % GEL Apply 2 g topically 3 (three) times daily before meals.     gabapentin (NEURONTIN) 100 MG capsule Take 2 capsules (200 mg total) by mouth at bedtime.     guaiFENesin (MUCINEX) 600 MG 12 hr  tablet Take 1 tablet (600 mg total) by mouth 2 (two) times daily.     hydrALAZINE (APRESOLINE) 10 MG tablet Take 1 tablet (10 mg total) by mouth every 6 (six) hours as needed (SBP > 180).     insulin aspart (NOVOLOG) 100 UNIT/ML injection Inject 4 Units into the skin 3 (three) times daily with meals. 10 mL 11   insulin glargine-yfgn (SEMGLEE) 100 UNIT/ML injection Inject 0.12 mLs (12 Units total) into the skin daily. 10 mL 11   insulin glargine-yfgn (SEMGLEE) 100 UNIT/ML injection Inject 0.04 mLs (4 Units total) into the skin daily. 10 mL 11   irbesartan (AVAPRO) 75 MG tablet Take 1 tablet (75 mg total) by mouth daily.     lactobacillus (FLORANEX/LACTINEX) PACK Take 1 packet (1 g total) by mouth 3 (three) times daily with meals.     loperamide (IMODIUM) 2 MG capsule Take 1 capsule (2 mg total) by mouth every 8 (eight) hours as needed for diarrhea or loose stools. 30 capsule 0   melatonin 3 MG TABS tablet Take 1 tablet (3 mg total) by mouth at bedtime.  0   Menthol-Methyl Salicylate (MUSCLE RUB) 10-15 % CREA Apply 1 Application topically as needed for muscle  pain. To right thigh  0   multivitamin (RENA-VIT) TABS tablet Take 1 tablet by mouth at bedtime.  0   nutrition supplement, JUVEN, (JUVEN) PACK Take 1 packet by mouth 2 (two) times daily between meals.  0   Nutritional Supplements (,FEEDING SUPPLEMENT, PROSOURCE PLUS) liquid Take 30 mLs by mouth 2 (two) times daily between meals.     pantoprazole (PROTONIX) 40 MG tablet Take 1 tablet (40 mg total) by mouth daily.     polycarbophil (FIBERCON) 625 MG tablet Take 1 tablet (625 mg total) by mouth 2 (two) times daily.     polyethylene glycol powder (GLYCOLAX/MIRALAX) 17 GM/SCOOP powder Take 17 g by mouth daily as needed for mild constipation. 238 g 0   torsemide (DEMADEX) 100 MG tablet Take 100 mg by mouth See admin instructions. Takes on Tuesday,Thursday,Saturday and Sunday(non-dialysis days)     traZODone (DESYREL) 50 MG tablet Take 0.5-1 tablets  (25-50 mg total) by mouth at bedtime as needed for sleep.     VELPHORO 500 MG chewable tablet Chew 1,000 mg by mouth 3 (three) times daily with meals.     No current facility-administered medications for this visit.     ROS:  See HPI  Physical Exam:  Incision:  healed nicely Extremities:   There is a palpable left radial pulse.   Motor and sensory are in tact.   There is a thrill/bruit present.  Access is  easily palpable    Assessment/Plan:  This is a 62 y.o. male who is s/p: left arm AVF revision with excision of thinning skin and small scab on 07/07/2022 by Dr. Chestine Spore  -the pt does not have evidence of steal. -okay to stick over incision starting on 09/18/2022.   -discussed with pt that access does not last forever and will need intervention or even new access at some point.  -the pt will follow up as needed.    Doreatha Massed, [facility] [phone]  Clinic MD:  Chestine Spore

## 2022-08-03 ENCOUNTER — Ambulatory Visit (INDEPENDENT_AMBULATORY_CARE_PROVIDER_SITE_OTHER): Payer: Medicare Other | Admitting: Physician Assistant

## 2022-08-03 ENCOUNTER — Encounter: Payer: Self-pay | Admitting: Physician Assistant

## 2022-08-03 DIAGNOSIS — N186 End stage renal disease: Secondary | ICD-10-CM

## 2022-08-09 ENCOUNTER — Encounter
Payer: BC Managed Care – PPO | Attending: Physical Medicine & Rehabilitation | Admitting: Physical Medicine & Rehabilitation

## 2022-08-13 ENCOUNTER — Telehealth: Payer: Self-pay | Admitting: Family

## 2022-08-13 NOTE — Telephone Encounter (Signed)
07/13/22 progress note faxed to [facility] along with prescription-swo [phone]

## 2022-09-14 ENCOUNTER — Encounter: Payer: Self-pay | Admitting: Family

## 2022-09-14 ENCOUNTER — Ambulatory Visit (INDEPENDENT_AMBULATORY_CARE_PROVIDER_SITE_OTHER): Payer: Medicare Other | Admitting: Family

## 2022-09-14 DIAGNOSIS — S88112A Complete traumatic amputation at level between knee and ankle, left lower leg, initial encounter: Secondary | ICD-10-CM

## 2022-09-14 DIAGNOSIS — Z89512 Acquired absence of left leg below knee: Secondary | ICD-10-CM

## 2022-09-14 NOTE — Progress Notes (Signed)
Post-Op Visit Note   Patient: Scott Castillo           Date of Birth: Jun 05, 1960           MRN: [id number] Visit Date: 09/14/2022 PCP: Olive Bass, MD  Chief Complaint:  Chief Complaint  Patient presents with   Left Leg - Routine Post Op    06/11/2022 left BKA     HPI:  HPI The patient is a 62 year old gentleman seen status post left below knee amputation. Has gotten his prosthesis and is set up for gait training with PT.  Ortho Exam On examination of left below knee amputation the incision is well healed. Limb well consolidated. No concerning sign.  Visit Diagnoses: No diagnosis found.  Plan: proceed with prothesis gait training.  Follow-Up Instructions: Return if symptoms worsen or fail to improve.   Imaging: No results found.  Orders:  No orders of the defined types were placed in this encounter.  No orders of the defined types were placed in this encounter.    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   Unilateral complete BKA, left, initial encounter (HCC) 06/15/2022   Abscess of left lower leg 06/11/2022   Subacute osteomyelitis of left foot (HCC) 06/10/2022   Pressure injury of skin 06/09/2022   Diabetic infection of left foot (HCC) 06/09/2022   HTN (hypertension) 06/09/2022   Right hip pain 06/09/2022   Expressive aphasia 04/20/2021   Obesity (BMI 30-39.9) 10/08/2020   Hyperkalemia, diminished renal excretion 08/15/2020   ESRD (end stage renal disease) (HCC) 09/04/2019   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) 07/03/2016   Hypertensive urgency 07/03/2016   Hyperlipidemia 07/03/2016   Anemia of chronic disease 07/03/2016   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   H/O pancreatic  cancer    HLD (hyperlipidemia)    HTN (hypertension)    Lumbar radiculopathy, chronic    hemilamectomy '94   Venous stasis dermatitis of both lower extremities     Family History  Problem Relation Age of Onset   Edema Mother    Heart attack Father    Heart failure Father    Heart attack Brother    Heart attack Brother     Past Surgical History:  Procedure Laterality Date   A/V FISTULAGRAM Left 09/27/2019   Procedure: A/V FISTULAGRAM;  Surgeon: Cephus Shelling, MD;  Location: [facility];  Service: Cardiovascular;  Laterality: Left;   AMPUTATION Left 06/11/2022   Procedure: LEFT BELOW THE KNEE AMPUTATION;  Surgeon: Nadara Mustard, MD;  Location: [facility] OR;  Service: Orthopedics;  Laterality: Left;   AV FISTULA PLACEMENT Left 09/17/2016   Procedure: ARTERIOVENOUS (AV) FISTULA CREATION;  Surgeon: Larina Earthly, MD;  Location: [facility] OR;  Service: Vascular;  Laterality: Left;   CATARACT EXTRACTION W/ INTRAOCULAR LENS IMPLANT Left 2015   ENDOVENOUS ABLATION SAPHENOUS VEIN W/ LASER Right 04/08/2016   EVLA R greater saphenous vein by Gretta Began MD   ENDOVENOUS ABLATION SAPHENOUS VEIN W/ LASER Left 05/13/2016   endovenous laser ablation (left greater saphenous vein) by Gretta Began MD    FOOT SURGERY     HEMILAMINOTOMY LUMBAR SPINE  '94   INSERTION OF  DIALYSIS CATHETER N/A 07/07/2022   Procedure: INSERTION OFTUNNELED  DIALYSIS CATHETER;  Surgeon: Cephus Shelling, MD;  Location: [facility] OR;  Service: Vascular;  Laterality: N/A;   REVISON OF ARTERIOVENOUS FISTULA Left 07/07/2022   Procedure: REVISON  OF ARTERIOVENOUS FISTULA LEFT ARM, PLICATION OF ANEURYSM.;  Surgeon: Cephus Shelling, MD;  Location: [facility] OR;  Service: Vascular;  Laterality: Left;  Block with MAC   TONSILLECTOMY     Social History   Occupational History   Not on file  Tobacco Use   Smoking status: Never   Smokeless tobacco: Never  Substance and Sexual Activity   Alcohol use: No   Drug use: No   Sexual activity: Not  on file

## 2023-01-10 NOTE — Progress Notes (Signed)
[facility]  [address] [place],  [place]  [address] [phone]  Clinic Day:  01/11/2023  Referring physician: Olive Bass, MD   HISTORY OF PRESENT ILLNESS:  The patient is a 62 y.o. male  who I was asked to consult upon for a questionably abnormal PET scan.  The patient fell earlier this year, which led to him hurting his right hip.  During his workup for his fall, a chest x-ray was done, which showed a right lung mass.  This was further confirmed by a chest CT.  However, a PET scan in July did not show this area to be hypermetabolic.  There was one suspicious hypermetabolic area seen at his right hip, which was the area where he fell.  However, a follow-up MRI showed nothing in his right hip that was worrisome for a malignant process.     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   ESRD (end stage renal disease) on dialysis (HCC)    History of anemia    History of kidney stones    HLD (hyperlipidemia)    HTN (hypertension)    Lumbar radiculopathy, chronic    hemilamectomy '94   Umbilical hernia    Venous stasis dermatitis of both lower extremities     PAST SURGICAL HISTORY:   Past Surgical History:  Procedure Laterality Date   A/V FISTULAGRAM Left 09/27/2019   Procedure: A/V FISTULAGRAM;  Surgeon: Cephus Shelling, MD;  Location: [facility];  Service: Cardiovascular;  Laterality: Left;   AMPUTATION Left 06/11/2022   Procedure: LEFT BELOW THE KNEE AMPUTATION;  Surgeon: Nadara Mustard, MD;  Location: [facility] OR;  Service: Orthopedics;  Laterality: Left;   AV FISTULA PLACEMENT Left 09/17/2016   Procedure: ARTERIOVENOUS (AV) FISTULA CREATION;  Surgeon: Larina Earthly, MD;  Location: [facility] OR;  Service: Vascular;  Laterality:  Left;   CATARACT EXTRACTION W/ INTRAOCULAR LENS IMPLANT Left 2015   ENDOVENOUS ABLATION SAPHENOUS VEIN W/ LASER Right 04/08/2016   EVLA R greater saphenous vein by Gretta Began MD   ENDOVENOUS ABLATION SAPHENOUS VEIN W/ LASER Left 05/13/2016   endovenous laser ablation (left greater saphenous vein) by Gretta Began MD    EYE SURGERY Bilateral    LASER   EYE SURGERY Left    REPAIR   FOOT SURGERY     LEFT SECOND TOE AMPUTATION   HEMILAMINOTOMY LUMBAR SPINE  '94   INSERTION OF DIALYSIS CATHETER N/A 07/07/2022   Procedure: INSERTION OFTUNNELED  DIALYSIS CATHETER;  Surgeon: Cephus Shelling, MD;  Location: [facility] OR;  Service: Vascular;  Laterality: N/A;   REVISON OF ARTERIOVENOUS FISTULA Left 07/07/2022   Procedure: REVISON  OF ARTERIOVENOUS FISTULA LEFT ARM, PLICATION OF ANEURYSM.;  Surgeon: Cephus Shelling, MD;  Location: [facility] OR;  Service: Vascular;  Laterality: Left;  Block with MAC   TONSILLECTOMY AND ADENOIDECTOMY     WISDOM TOOTH EXTRACTION      CURRENT MEDICATIONS:   Current Outpatient Medications  Medication Sig Dispense Refill   aspirin EC 81 MG tablet Take by mouth.     B Complex Vitamins (B-COMPLEX/B-12) TABS Take by mouth.     Cinacalcet HCl (SENSIPAR PO) Take by mouth.  collagenase (SANTYL) 250 UNIT/GM ointment Apply to wound on heel daily     ferric citrate (AURYXIA) 1 GM 210 MG(Fe) tablet Take by mouth.     glimepiride (AMARYL) 1 MG tablet Take by mouth.     Insulin Pen Needle (BD ULTRA-FINE PEN NEEDLES) 29G X 12.7MM MISC USE AS DIRECTED EVERY DAY     lanthanum (FOSRENOL) 1000 MG chewable tablet Chew by mouth.     Methoxy PEG-Epoetin Beta (MIRCERA) 30 MCG/0.3ML SOSY 150 mcg.     RYBELSUS 14 MG TABS Take by mouth.     ascorbic acid (VITAMIN C) 1000 MG tablet Take 1,000 mg by mouth daily.     atorvastatin (LIPITOR) 40 MG tablet Take 1 tablet (40 mg total) by mouth daily. 30 tablet 0   calcitRIOL (ROCALTROL) 0.25 MCG capsule Take 5 capsules (1.25 mcg total) by mouth  every Monday, Wednesday, and Friday with hemodialysis.     carvedilol (COREG) 25 MG tablet Take 1 tablet (25 mg total) by mouth 2 (two) times daily with a meal. 60 tablet 0   co-enzyme Q-10 30 MG capsule Take 30 mg by mouth 2 (two) times daily.     cyanocobalamin 1000 MCG tablet Take 1,000 mcg by mouth daily.     diclofenac Sodium (VOLTAREN) 1 % GEL Apply 2 g topically 3 (three) times daily before meals.     gabapentin (NEURONTIN) 100 MG capsule Take 2 capsules (200 mg total) by mouth at bedtime.     guaiFENesin (MUCINEX) 600 MG 12 hr tablet Take 1 tablet (600 mg total) by mouth 2 (two) times daily.     hydrALAZINE (APRESOLINE) 10 MG tablet Take 1 tablet (10 mg total) by mouth every 6 (six) hours as needed (SBP > 180).     insulin aspart (NOVOLOG) 100 UNIT/ML injection Inject 4 Units into the skin 3 (three) times daily with meals. 10 mL 11   insulin glargine-yfgn (SEMGLEE) 100 UNIT/ML injection Inject 0.04 mLs (4 Units total) into the skin daily. 10 mL 11   irbesartan (AVAPRO) 75 MG tablet Take 1 tablet (75 mg total) by mouth daily.     lactobacillus (FLORANEX/LACTINEX) PACK Take 1 packet (1 g total) by mouth 3 (three) times daily with meals.     loperamide (IMODIUM) 2 MG capsule Take 1 capsule (2 mg total) by mouth every 8 (eight) hours as needed for diarrhea or loose stools. 30 capsule 0   melatonin 3 MG TABS tablet Take 1 tablet (3 mg total) by mouth at bedtime.  0   Menthol-Methyl Salicylate (MUSCLE RUB) 10-15 % CREA Apply 1 Application topically as needed for muscle pain. To right thigh  0   multivitamin (RENA-VIT) TABS tablet Take 1 tablet by mouth at bedtime.  0   nutrition supplement, JUVEN, (JUVEN) PACK Take 1 packet by mouth 2 (two) times daily between meals.  0   Nutritional Supplements (,FEEDING SUPPLEMENT, PROSOURCE PLUS) liquid Take 30 mLs by mouth 2 (two) times daily between meals.     pantoprazole (PROTONIX) 40 MG tablet Take 1 tablet (40 mg total) by mouth daily.     polycarbophil  (FIBERCON) 625 MG tablet Take 1 tablet (625 mg total) by mouth 2 (two) times daily.     polyethylene glycol powder (GLYCOLAX/MIRALAX) 17 GM/SCOOP powder Take 17 g by mouth daily as needed for mild constipation. 238 g 0   torsemide (DEMADEX) 100 MG tablet Take 100 mg by mouth See admin instructions. Takes on Tuesday,Thursday,Saturday and Sunday(non-dialysis days)  traZODone (DESYREL) 50 MG tablet Take 0.5-1 tablets (25-50 mg total) by mouth at bedtime as needed for sleep.     VELPHORO 500 MG chewable tablet Chew 1,000 mg by mouth 3 (three) times daily with meals.     No current facility-administered medications for this visit.    ALLERGIES:   Allergies  Allergen Reactions   Pioglitazone Swelling    Site of swelling not recalled by patient   Adhesive [Tape] Other (See Comments)    Causes redness, pt prefers paper tape    Heparin Other (See Comments)    Per pt he has episodes where he can't speak. He has tolerated this med multiple times since this entered so unlikely it is true allergy   Latex Rash    Redness, also    FAMILY HISTORY:   Family History  Problem Relation Age of Onset   Edema Mother    Heart attack Father    Heart failure Father    Heart attack Brother    Pancreatic cancer Brother    Heart attack Brother    Heart disease Brother    Prostate cancer Brother    Heart disease Brother    Heart attack Brother     SOCIAL HISTORY:  The patient was born and raised in [place].  [place] his wife of 36 years.  They have 1 child.  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  There is no recent history of alcohol or tobacco abuse.    REVIEW OF SYSTEMS:  Review of Systems  Constitutional:  Negative for fatigue, fever and unexpected weight change.  Respiratory:  Negative for chest tightness, cough, hemoptysis and shortness of breath.   Cardiovascular:  Negative for chest pain and palpitations.  Gastrointestinal:  Positive for diarrhea.  Negative for abdominal distention, abdominal pain, blood in stool, constipation, nausea and vomiting.  Genitourinary:  Negative for dysuria, frequency and hematuria.   Musculoskeletal:  Positive for arthralgias. Negative for back pain and myalgias.  Skin:  Positive for itching. Negative for rash.  Neurological:  Negative for dizziness, headaches and light-headedness.  Psychiatric/Behavioral:  Negative for depression and suicidal ideas. The patient is not nervous/anxious.     PHYSICAL EXAM:  Blood pressure 131/68, pulse 85, temperature 98.4 F (36.9 C), resp. rate 18, height 6' (1.829 m), weight 231 lb 12.8 oz (105.1 kg), SpO2 94%. Wt Readings from Last 3 Encounters:  01/11/23 231 lb 12.8 oz (105.1 kg)  07/08/22 219 lb 2.2 oz (99.4 kg)  06/14/22 251 lb 12.3 oz (114.2 kg)   Body mass index is 31.44 kg/m. Performance status (ECOG): 2 - Symptomatic, <50% confined to bed Physical Exam Constitutional:      Appearance: Normal appearance. He is not ill-appearing.  HENT:     Mouth/Throat:     Mouth: Mucous membranes are moist.     Pharynx: Oropharynx is clear. No oropharyngeal exudate or posterior oropharyngeal erythema.  Cardiovascular:     Rate and Rhythm: Normal rate and regular rhythm.     Heart sounds: No murmur heard.    No friction rub. No gallop.  Pulmonary:     Effort: Pulmonary effort is normal. No respiratory distress.     Breath sounds: Normal breath sounds. No wheezing, rhonchi or rales.  Abdominal:     General: Bowel sounds are normal. There is no distension.     Palpations: Abdomen is soft. There is no mass.     Tenderness: There is  no abdominal tenderness.  Musculoskeletal:        General: No swelling.     Right lower leg: No edema.     Left lower leg: No edema.  Lymphadenopathy:     Cervical: No cervical adenopathy.     Upper Body:     Right upper body: No supraclavicular or axillary adenopathy.     Left upper body: No supraclavicular or axillary adenopathy.      Lower Body: No right inguinal adenopathy. No left inguinal adenopathy.  Skin:    General: Skin is warm.     Coloration: Skin is not jaundiced.     Findings: No lesion or rash.  Neurological:     General: No focal deficit present.     Mental Status: He is alert and oriented to person, place, and time. Mental status is at baseline.  Psychiatric:        Mood and Affect: Mood normal.        Behavior: Behavior normal.        Thought Content: Thought content normal.   ASSESSMENT & PLAN:  A 62 y.o. male who I was asked to consult upon due to having an abnormal PET scan.  In clinic today, I showed the patient how his suspicious right lung mass did not light up per his PET scan.  Furthermore, the suspicious right hip lesion no longer had ominous features on his most recent MRI.  Overall, I see no evidence of any type of malignancy being present.  Based upon this, I will turn his care back over to his other physicians.  The patient understands all the plans discussed today and is in agreement with them.  I do appreciate Dough, Doris Cheadle, MD for his new consult.   Brant Peets Kirby Funk, MD

## 2023-01-11 ENCOUNTER — Inpatient Hospital Stay: Payer: Medicare Other

## 2023-01-11 ENCOUNTER — Encounter: Payer: Self-pay | Admitting: Oncology

## 2023-01-11 ENCOUNTER — Inpatient Hospital Stay: Payer: Medicare Other | Attending: Oncology | Admitting: Oncology

## 2023-01-11 VITALS — BP 131/68 | HR 85 | Temp 98.4°F | Resp 18 | Ht 72.0 in | Wt 231.8 lb

## 2023-01-11 DIAGNOSIS — Z8042 Family history of malignant neoplasm of prostate: Secondary | ICD-10-CM | POA: Diagnosis not present

## 2023-01-11 DIAGNOSIS — R942 Abnormal results of pulmonary function studies: Secondary | ICD-10-CM

## 2023-01-11 DIAGNOSIS — Z800 Family history of malignant neoplasm of digestive organs: Secondary | ICD-10-CM

## 2023-01-16 DIAGNOSIS — R942 Abnormal results of pulmonary function studies: Secondary | ICD-10-CM | POA: Insufficient documentation

## 2024-04-19 DEATH — deceased
